# Patient Record
Sex: Female | Born: 1935
Health system: Southern US, Community
[De-identification: ages and names within clinical notes are randomized; demographics above are authoritative.]

## PROBLEM LIST (undated history)

## (undated) DIAGNOSIS — D500 Iron deficiency anemia secondary to blood loss (chronic): Secondary | ICD-10-CM

## (undated) DIAGNOSIS — K3189 Other diseases of stomach and duodenum: Secondary | ICD-10-CM

## (undated) DIAGNOSIS — E119 Type 2 diabetes mellitus without complications: Secondary | ICD-10-CM

## (undated) DIAGNOSIS — K5520 Angiodysplasia of colon without hemorrhage: Secondary | ICD-10-CM

## (undated) DIAGNOSIS — D649 Anemia, unspecified: Secondary | ICD-10-CM

## (undated) DIAGNOSIS — K219 Gastro-esophageal reflux disease without esophagitis: Secondary | ICD-10-CM

## (undated) DIAGNOSIS — I10 Essential (primary) hypertension: Secondary | ICD-10-CM

## (undated) DIAGNOSIS — C4491 Basal cell carcinoma of skin, unspecified: Secondary | ICD-10-CM

## (undated) DIAGNOSIS — E079 Disorder of thyroid, unspecified: Secondary | ICD-10-CM

## (undated) DIAGNOSIS — F419 Anxiety disorder, unspecified: Secondary | ICD-10-CM

## (undated) DIAGNOSIS — R7303 Prediabetes: Secondary | ICD-10-CM

## (undated) DIAGNOSIS — M1990 Unspecified osteoarthritis, unspecified site: Secondary | ICD-10-CM

## (undated) DIAGNOSIS — K5790 Diverticulosis of intestine, part unspecified, without perforation or abscess without bleeding: Secondary | ICD-10-CM

## (undated) DIAGNOSIS — K31819 Angiodysplasia of stomach and duodenum without bleeding: Secondary | ICD-10-CM

## (undated) DIAGNOSIS — K7460 Unspecified cirrhosis of liver: Secondary | ICD-10-CM

## (undated) DIAGNOSIS — K766 Portal hypertension: Secondary | ICD-10-CM

## (undated) HISTORY — DX: Disorder of thyroid, unspecified: E07.9

## (undated) HISTORY — PX: COLONOSCOPY: SHX174

## (undated) HISTORY — PX: UPPER GASTROINTESTINAL ENDOSCOPY: SHX188

## (undated) HISTORY — DX: Basal cell carcinoma of skin, unspecified: C44.91

## (undated) HISTORY — DX: Gastro-esophageal reflux disease without esophagitis: K21.9

## (undated) HISTORY — DX: Angiodysplasia of colon without hemorrhage: K55.20

## (undated) HISTORY — PX: MOHS SURGERY: SUR867

## (undated) HISTORY — PX: ESOPHAGOGASTRODUODENOSCOPY: SHX1529

## (undated) HISTORY — PX: TOTAL HIP ARTHROPLASTY: SHX124

## (undated) HISTORY — DX: Anemia, unspecified: D64.9

## (undated) HISTORY — DX: Other diseases of stomach and duodenum: K31.89

## (undated) HISTORY — PX: ABDOMINAL HYSTERECTOMY: SHX81

## (undated) HISTORY — DX: Type 2 diabetes mellitus without complications: E11.9

## (undated) HISTORY — DX: Portal hypertension: K76.6

## (undated) HISTORY — DX: Angiodysplasia of stomach and duodenum without bleeding: K31.819

## (undated) HISTORY — DX: Iron deficiency anemia secondary to blood loss (chronic): D50.0

## (undated) HISTORY — DX: Unspecified cirrhosis of liver: K74.60

## (undated) HISTORY — DX: Anxiety disorder, unspecified: F41.9

## (undated) HISTORY — DX: Unspecified osteoarthritis, unspecified site: M19.90

## (undated) HISTORY — PX: TONSILLECTOMY: SUR1361

## (undated) HISTORY — DX: Essential (primary) hypertension: I10

## (undated) HISTORY — PX: APPENDECTOMY: SHX54

## (undated) HISTORY — DX: Diverticulosis of intestine, part unspecified, without perforation or abscess without bleeding: K57.90

---

## 2007-05-15 DIAGNOSIS — R7301 Impaired fasting glucose: Secondary | ICD-10-CM | POA: Insufficient documentation

## 2007-05-15 DIAGNOSIS — L408 Other psoriasis: Secondary | ICD-10-CM | POA: Insufficient documentation

## 2007-05-15 DIAGNOSIS — K5730 Diverticulosis of large intestine without perforation or abscess without bleeding: Secondary | ICD-10-CM | POA: Insufficient documentation

## 2007-05-15 DIAGNOSIS — N951 Menopausal and female climacteric states: Secondary | ICD-10-CM | POA: Insufficient documentation

## 2008-11-22 DIAGNOSIS — E039 Hypothyroidism, unspecified: Secondary | ICD-10-CM | POA: Insufficient documentation

## 2009-05-24 DIAGNOSIS — D649 Anemia, unspecified: Secondary | ICD-10-CM | POA: Insufficient documentation

## 2009-05-24 DIAGNOSIS — D509 Iron deficiency anemia, unspecified: Secondary | ICD-10-CM | POA: Insufficient documentation

## 2009-10-28 DIAGNOSIS — K219 Gastro-esophageal reflux disease without esophagitis: Secondary | ICD-10-CM | POA: Insufficient documentation

## 2010-04-27 DIAGNOSIS — I10 Essential (primary) hypertension: Secondary | ICD-10-CM | POA: Insufficient documentation

## 2011-06-28 ENCOUNTER — Encounter: Payer: Self-pay | Admitting: Internal Medicine

## 2011-06-28 ENCOUNTER — Ambulatory Visit (INDEPENDENT_AMBULATORY_CARE_PROVIDER_SITE_OTHER): Payer: Medicare Other | Admitting: Internal Medicine

## 2011-06-28 VITALS — BP 148/72 | HR 81 | Temp 97.9°F | Resp 16 | Ht 67.0 in | Wt 187.0 lb

## 2011-06-28 DIAGNOSIS — E039 Hypothyroidism, unspecified: Secondary | ICD-10-CM

## 2011-06-28 DIAGNOSIS — F419 Anxiety disorder, unspecified: Secondary | ICD-10-CM | POA: Insufficient documentation

## 2011-06-28 DIAGNOSIS — Z780 Asymptomatic menopausal state: Secondary | ICD-10-CM | POA: Insufficient documentation

## 2011-06-28 DIAGNOSIS — Z90710 Acquired absence of both cervix and uterus: Secondary | ICD-10-CM | POA: Insufficient documentation

## 2011-06-28 DIAGNOSIS — M1990 Unspecified osteoarthritis, unspecified site: Secondary | ICD-10-CM | POA: Insufficient documentation

## 2011-06-28 DIAGNOSIS — Z862 Personal history of diseases of the blood and blood-forming organs and certain disorders involving the immune mechanism: Secondary | ICD-10-CM

## 2011-06-28 DIAGNOSIS — I10 Essential (primary) hypertension: Secondary | ICD-10-CM | POA: Insufficient documentation

## 2011-06-28 DIAGNOSIS — F411 Generalized anxiety disorder: Secondary | ICD-10-CM

## 2011-06-28 DIAGNOSIS — E785 Hyperlipidemia, unspecified: Secondary | ICD-10-CM | POA: Insufficient documentation

## 2011-06-28 DIAGNOSIS — H409 Unspecified glaucoma: Secondary | ICD-10-CM | POA: Insufficient documentation

## 2011-06-28 DIAGNOSIS — K219 Gastro-esophageal reflux disease without esophagitis: Secondary | ICD-10-CM | POA: Insufficient documentation

## 2011-06-28 MED ORDER — QUINAPRIL HCL 40 MG PO TABS: 40.0000 mg | ORAL_TABLET | Freq: Every day | ORAL | Status: DC

## 2011-06-28 MED ORDER — FUROSEMIDE 20 MG PO TABS: 20.0000 mg | ORAL_TABLET | Freq: Every day | ORAL | Status: DC

## 2011-06-28 MED ORDER — PANTOPRAZOLE SODIUM 40 MG PO TBEC: 40.0000 mg | DELAYED_RELEASE_TABLET | Freq: Every day | ORAL | Status: DC

## 2011-06-28 MED ORDER — LEVOTHYROXINE SODIUM 75 MCG PO TABS: 75.0000 ug | ORAL_TABLET | Freq: Every day | ORAL | Status: DC

## 2011-06-28 NOTE — Progress Notes (Signed)
Subjective:    Patient ID: Jessica Dennis, female    DOB: 05/25/1932, 78 y.o.   MRN: [id number]  HPI New pt here for first  Visit.  Former care Dr. Louisa Second in [place].    PMH of anemia, anxiety well controlled on Ativan,  DJD, HTN, GERD,  Hyperlipidemia, menopause, glaucoma and hypothyroidism.     Overall doing well.  She reports she has been on Hormones "forever"  Over 10 years. She is S/P Hysterectomy  She has a history of anemia nd is on Iron but does not want her blood checked until her CPE  No Known Allergies Past Medical History  Diagnosis Date  . Anemia   . Anxiety   . Arthritis   . Hypertension   . Thyroid disease    Past Surgical History  Procedure Date  . Abdominal hysterectomy   . Joint replacement 97 and 06    Lt and RT hip  . Appendectomy   . Tonsillectomy    History   Social History  . Marital Status: Divorced    Spouse Name: N/A    Number of Children: N/A  . Years of Education: N/A   Occupational History  . Not on file.   Social History Main Topics  . Smoking status: Never Smoker   . Smokeless tobacco: Never Used  . Alcohol Use: Yes  . Drug Use: No  . Sexually Active:    Other Topics Concern  . Not on file   Social History Narrative  . No narrative on file   Family History  Problem Relation Age of Onset  . Cancer Father     bladder  . Heart attack Father    Patient Active Problem List  Diagnoses  . History of anemia  . Anxiety  . Essential hypertension, benign  . GERD (gastroesophageal reflux disease)  . Other and unspecified hyperlipidemia  . History of hysterectomy  . Menopause  . Hypothyroidism  . Glaucoma  . DJD (degenerative joint disease)   Current Outpatient Prescriptions on File Prior to Visit  Medication Sig Dispense Refill  . calcium carbonate (OS-CAL) 1250 MG chewable tablet Chew 1 tablet by mouth daily.      Marland Kitchen estradiol (ESTRACE) 0.5 MG tablet Take 0.5 mg by mouth daily.      . furosemide (LASIX) 20 MG  tablet Take 20 mg by mouth daily.      Marland Kitchen levothyroxine (SYNTHROID, LEVOTHROID) 75 MCG tablet Take 75 mcg by mouth daily.      . pantoprazole (PROTONIX) 40 MG tablet Take 40 mg by mouth daily.      . quinapril (ACCUPRIL) 40 MG tablet Take 40 mg by mouth at bedtime.           Review of Systems See HPI    Objective:   Physical Exam  Physical Exam  Nursing note and vitals reviewed.  Constitutional: She is oriented to person, place, and time. She appears well-developed and well-nourished.  HENT:  Head: Normocephalic and atraumatic.  Cardiovascular: Normal rate and regular rhythm. Exam reveals no gallop and no friction rub.  No murmur heard.  Pulmonary/Chest: Breath sounds normal. She has no wheezes. She has no rales.  Neurological: She is alert and oriented to person, place, and time.  Skin: Skin is warm and dry.  Psychiatric: She has a normal mood and affect. Her behavior is normal.         Assessment & Plan:  Menopause  I counseled pt that given her age and  lengthof time on Estrogen that risks outweigh benefit.  She is OK with coming off Estradiol  HTN  ISH today if still elevated at CPE visit will need to adjust meds  History of Hyperlipidemia  History of anemia  Anxiety  GERD  Hypothyroidism    Willl need to re-order meds  Schedule CPE

## 2011-06-28 NOTE — Patient Instructions (Signed)
Schedule CPE    Come in fasting

## 2011-08-01 ENCOUNTER — Other Ambulatory Visit: Payer: Self-pay | Admitting: *Deleted

## 2011-08-01 MED ORDER — LORAZEPAM 0.5 MG PO TABS: 0.5000 mg | ORAL_TABLET | Freq: Two times a day (BID) | ORAL | Status: DC | PRN

## 2011-08-01 NOTE — Telephone Encounter (Signed)
Jessica Dennis ok to call in

## 2011-08-06 DIAGNOSIS — M179 Osteoarthritis of knee, unspecified: Secondary | ICD-10-CM | POA: Insufficient documentation

## 2011-08-06 DIAGNOSIS — M25569 Pain in unspecified knee: Secondary | ICD-10-CM | POA: Insufficient documentation

## 2011-08-06 DIAGNOSIS — M722 Plantar fascial fibromatosis: Secondary | ICD-10-CM | POA: Insufficient documentation

## 2011-08-06 DIAGNOSIS — M1710 Unilateral primary osteoarthritis, unspecified knee: Secondary | ICD-10-CM | POA: Insufficient documentation

## 2011-08-06 DIAGNOSIS — M79673 Pain in unspecified foot: Secondary | ICD-10-CM | POA: Insufficient documentation

## 2011-09-02 ENCOUNTER — Telehealth: Payer: Self-pay | Admitting: Internal Medicine

## 2011-09-02 NOTE — Telephone Encounter (Signed)
Jessica Dennis   Call pt and inform she needs fasting labs.  Per her old chart in 04/2009 she had elevated glucose, and was quite anemic and her thyroid blood work was not normal.  I do not see any repeat labs in her old record after that time.  She was seen 03/29/2010 but I do not see labs in her old record.    Please get fasting Lipids, CBC, chem 24,   And TSH,  And advise despite any labs from March, they need to be repeated.  Can use anemia, 401.1 and 272.4 for codes  Thanks

## 2011-09-03 ENCOUNTER — Telehealth: Payer: Self-pay | Admitting: *Deleted

## 2011-09-03 DIAGNOSIS — E785 Hyperlipidemia, unspecified: Secondary | ICD-10-CM

## 2011-09-03 DIAGNOSIS — I10 Essential (primary) hypertension: Secondary | ICD-10-CM

## 2011-09-03 NOTE — Telephone Encounter (Signed)
LM on cell voicemail that Dr Constance Goltz wants her to come in for fasting labwork.  Will go ahead and order the tests so that pt can choose whatever time and day she wants to come in.

## 2011-09-18 ENCOUNTER — Other Ambulatory Visit: Payer: Self-pay | Admitting: Internal Medicine

## 2011-09-18 LAB — CBC WITH DIFFERENTIAL/PLATELET
Basophils Absolute: 0 10*3/uL (ref 0.0–0.1)
Basophils Relative: 1 % (ref 0–1)
Eosinophils Absolute: 0.2 10*3/uL (ref 0.0–0.7)
Eosinophils Relative: 4 % (ref 0–5)
HCT: 37.5 % (ref 36.0–46.0)
Hemoglobin: 12.6 g/dL (ref 12.0–15.0)
Lymphocytes Relative: 20 % (ref 12–46)
Lymphs Abs: 1.1 10*3/uL (ref 0.7–4.0)
MCH: 32.5 pg (ref 26.0–34.0)
MCHC: 33.6 g/dL (ref 30.0–36.0)
MCV: 96.6 fL (ref 78.0–100.0)
Monocytes Absolute: 0.4 10*3/uL (ref 0.1–1.0)
Monocytes Relative: 6 % (ref 3–12)
Neutro Abs: 3.9 10*3/uL (ref 1.7–7.7)
Neutrophils Relative %: 69 % (ref 43–77)
Platelets: 89 10*3/uL — ABNORMAL LOW (ref 150–400)
RBC: 3.88 MIL/uL (ref 3.87–5.11)
RDW: 14.3 % (ref 11.5–15.5)
WBC: 5.6 10*3/uL (ref 4.0–10.5)

## 2011-09-18 LAB — LIPID PANEL
Cholesterol: 218 mg/dL — ABNORMAL HIGH (ref 0–200)
HDL: 54 mg/dL (ref >39)
LDL Cholesterol: 144 mg/dL — ABNORMAL HIGH (ref 0–99)
Total CHOL/HDL Ratio: 4 Ratio
Triglycerides: 99 mg/dL (ref <150)
VLDL: 20 mg/dL (ref 0–40)

## 2011-09-18 LAB — TSH: TSH: 3.838 u[IU]/mL (ref 0.350–4.500)

## 2011-09-19 ENCOUNTER — Encounter: Payer: Self-pay | Admitting: Internal Medicine

## 2011-09-19 ENCOUNTER — Telehealth: Payer: Self-pay | Admitting: Internal Medicine

## 2011-09-19 ENCOUNTER — Telehealth: Payer: Self-pay | Admitting: *Deleted

## 2011-09-19 DIAGNOSIS — R739 Hyperglycemia, unspecified: Secondary | ICD-10-CM | POA: Insufficient documentation

## 2011-09-19 LAB — HEMOGLOBIN A1C
Hgb A1c MFr Bld: 6 % — ABNORMAL HIGH (ref <5.7)
Mean Plasma Glucose: 126 mg/dL — ABNORMAL HIGH (ref <117)

## 2011-09-19 LAB — COMPLETE METABOLIC PANEL WITH GFR
ALT: 28 U/L (ref 0–35)
AST: 38 U/L — ABNORMAL HIGH (ref 0–37)
Albumin: 4.3 g/dL (ref 3.5–5.2)
Alkaline Phosphatase: 111 U/L (ref 39–117)
BUN: 16 mg/dL (ref 6–23)
CO2: 29 mEq/L (ref 19–32)
Calcium: 9.9 mg/dL (ref 8.4–10.5)
Chloride: 104 mEq/L (ref 96–112)
Creat: 0.75 mg/dL (ref 0.50–1.10)
GFR, Est African American: >89 mL/min
GFR, Est Non African American: 78 mL/min
Glucose, Bld: 168 mg/dL — ABNORMAL HIGH (ref 70–99)
Potassium: 4.2 mEq/L (ref 3.5–5.3)
Sodium: 141 mEq/L (ref 135–145)
Total Bilirubin: 1.1 mg/dL (ref 0.3–1.2)
Total Protein: 6.5 g/dL (ref 6.0–8.3)

## 2011-09-19 NOTE — Telephone Encounter (Signed)
Copy of labs mailed to pt's home address. 

## 2011-09-19 NOTE — Telephone Encounter (Signed)
Spoke with pt regarding glucose, confirmed appt for September. HGBA1C ordered

## 2011-09-19 NOTE — Telephone Encounter (Signed)
Jessica Dennis  Add a Hgb AIC to labs done on 8/27.  Call pt and let her know that her blood sugar is high and to be sure to keep her appt. with me in September.   OK to mail labs to her.

## 2011-10-02 ENCOUNTER — Ambulatory Visit (HOSPITAL_BASED_OUTPATIENT_CLINIC_OR_DEPARTMENT_OTHER)
Admission: RE | Admit: 2011-10-02 | Discharge: 2011-10-02 | Disposition: A | Discharge status: Home / Self Care | Payer: Medicare Other | Source: Ambulatory Visit | Attending: Internal Medicine | Admitting: Internal Medicine

## 2011-10-02 ENCOUNTER — Encounter: Payer: Self-pay | Admitting: Internal Medicine

## 2011-10-02 ENCOUNTER — Other Ambulatory Visit: Payer: Self-pay | Admitting: Internal Medicine

## 2011-10-02 ENCOUNTER — Other Ambulatory Visit: Payer: Self-pay | Admitting: *Deleted

## 2011-10-02 ENCOUNTER — Ambulatory Visit (INDEPENDENT_AMBULATORY_CARE_PROVIDER_SITE_OTHER): Payer: Medicare Other | Admitting: Internal Medicine

## 2011-10-02 VITALS — BP 144/84 | HR 86 | Temp 97.2°F | Resp 20 | Wt 182.0 lb

## 2011-10-02 DIAGNOSIS — Z0000 Encounter for general adult medical examination without abnormal findings: Secondary | ICD-10-CM

## 2011-10-02 DIAGNOSIS — Z1231 Encounter for screening mammogram for malignant neoplasm of breast: Secondary | ICD-10-CM | POA: Insufficient documentation

## 2011-10-02 DIAGNOSIS — R059 Cough, unspecified: Secondary | ICD-10-CM

## 2011-10-02 DIAGNOSIS — K219 Gastro-esophageal reflux disease without esophagitis: Secondary | ICD-10-CM

## 2011-10-02 DIAGNOSIS — R739 Hyperglycemia, unspecified: Secondary | ICD-10-CM

## 2011-10-02 DIAGNOSIS — Z862 Personal history of diseases of the blood and blood-forming organs and certain disorders involving the immune mechanism: Secondary | ICD-10-CM

## 2011-10-02 DIAGNOSIS — E785 Hyperlipidemia, unspecified: Secondary | ICD-10-CM

## 2011-10-02 DIAGNOSIS — R05 Cough: Secondary | ICD-10-CM

## 2011-10-02 DIAGNOSIS — Z23 Encounter for immunization: Secondary | ICD-10-CM

## 2011-10-02 DIAGNOSIS — I10 Essential (primary) hypertension: Secondary | ICD-10-CM

## 2011-10-02 DIAGNOSIS — E039 Hypothyroidism, unspecified: Secondary | ICD-10-CM

## 2011-10-02 DIAGNOSIS — R7309 Other abnormal glucose: Secondary | ICD-10-CM

## 2011-10-02 DIAGNOSIS — M1990 Unspecified osteoarthritis, unspecified site: Secondary | ICD-10-CM

## 2011-10-02 LAB — POCT URINALYSIS DIPSTICK
Bilirubin, UA: NEGATIVE
Blood, UA: NEGATIVE
Glucose, UA: NEGATIVE
Ketones, UA: NEGATIVE
Leukocytes, UA: NEGATIVE
Nitrite, UA: NEGATIVE
Protein, UA: NEGATIVE
Spec Grav, UA: 1.015
Urobilinogen, UA: NEGATIVE
pH, UA: 6

## 2011-10-02 MED ORDER — QUINAPRIL HCL 40 MG PO TABS: 40.0000 mg | ORAL_TABLET | Freq: Every day | ORAL | Status: DC

## 2011-10-02 MED ORDER — FUROSEMIDE 20 MG PO TABS: 20.0000 mg | ORAL_TABLET | Freq: Every day | ORAL | Status: DC

## 2011-10-02 MED ORDER — PANTOPRAZOLE SODIUM 40 MG PO TBEC: 40.0000 mg | DELAYED_RELEASE_TABLET | Freq: Every day | ORAL | Status: DC

## 2011-10-02 MED ORDER — LEVOTHYROXINE SODIUM 75 MCG PO TABS: 75.0000 ug | ORAL_TABLET | Freq: Every day | ORAL | Status: DC

## 2011-10-02 NOTE — Patient Instructions (Signed)
See me in 2-3 months  Take lasix daily

## 2011-10-02 NOTE — Telephone Encounter (Signed)
Jessica Dennis   Call this in for pt.  Let her know that I cannot prescribe 90 fdays of Ativan

## 2011-10-02 NOTE — Telephone Encounter (Signed)
Pharmacy called regarding Ativan RX

## 2011-10-02 NOTE — Progress Notes (Signed)
Subjective:    Patient ID: Jessica Dennis, female    DOB: 23-Jun-1932, 78 y.o.   MRN: [id number]  Jessica Dennis is here for comprehensive eval.    She reports she has not been taking her Lasix as she has not had any edema.  See BP  She reports she has had elevated sugar in the past but no-one has ever mentioned DM.  She does have FH of DM. No polyphagia , no polydipsia  She does water aerobics for exercise  Lots of sun exposure and she sees Jessica Dennis for dermatology  She is S/P hysterectomy  And is overdue for her mammogram  She has cough on and off and not sure if related to ACE inhibitor.  Occurs at night.  Has been going on for 4 weeks now.  No SOB,   Has been on Accupril "forever"  No Known Allergies Past Medical History  Diagnosis Date  . Anemia   . Anxiety   . Arthritis   . Hypertension   . Thyroid disease    Past Surgical History  Procedure Date  . Abdominal hysterectomy   . Joint replacement 97 and 06    Lt and RT hip  . Appendectomy   . Tonsillectomy    History   Social History  . Marital Status: Divorced    Spouse Name: N/A    Number of Children: N/A  . Years of Education: N/A   Occupational History  . Not on file.   Social History Main Topics  . Smoking status: Never Smoker   . Smokeless tobacco: Never Used  . Alcohol Use: Yes  . Drug Use: No  . Sexually Active: No   Other Topics Concern  . Not on file   Social History Narrative  . No narrative on file   Family History  Problem Relation Age of Onset  . Cancer Father     bladder  . Heart attack Father    Patient Active Problem List  Diagnosis  . History of anemia  . Anxiety  . Essential hypertension, benign  . GERD (gastroesophageal reflux disease)  . Other and unspecified hyperlipidemia  . History of hysterectomy  . Menopause  . Hypothyroidism  . Glaucoma  . DJD (degenerative joint disease)  . Hyperglycemia   Current Outpatient Prescriptions on File Prior to Visit  Medication Sig  Dispense Refill  . calcium carbonate (OS-CAL) 1250 MG chewable tablet Chew 1 tablet by mouth daily.      . naproxen (NAPROSYN) 500 MG tablet Take 500 mg by mouth 2 (two) times daily with a meal.      . Prednicarbate 0.1 % CREA Apply topically.      . travoprost, benzalkonium, (TRAVATAN) 0.004 % ophthalmic solution 1 drop at bedtime.      Marland Kitchen DISCONTD: furosemide (LASIX) 20 MG tablet Take 1 tablet (20 mg total) by mouth daily.  90 tablet  0  . DISCONTD: levothyroxine (SYNTHROID, LEVOTHROID) 75 MCG tablet Take 1 tablet (75 mcg total) by mouth daily.  90 tablet  0  . DISCONTD: pantoprazole (PROTONIX) 40 MG tablet Take 1 tablet (40 mg total) by mouth daily.  90 tablet  0  . DISCONTD: quinapril (ACCUPRIL) 40 MG tablet Take 1 tablet (40 mg total) by mouth at bedtime.  90 tablet  0       Review of Systems  Constitutional: Negative.   HENT: Negative.   Respiratory: Positive for cough.   All other systems reviewed and are negative.  Objective:   Physical Exam Physical Exam  Nursing note and vitals reviewed.  Constitutional: She is oriented to person, place, and time. She appears well-developed and well-nourished.  HENT:  Head: Normocephalic and atraumatic.  Right Ear: Tympanic membrane and ear canal normal. No drainage. Tympanic membrane is not injected and not erythematous.  Left Ear: Tympanic membrane and ear canal normal. No drainage. Tympanic membrane is not injected and not erythematous.  Nose: Nose normal. Right sinus exhibits no maxillary sinus tenderness and no frontal sinus tenderness. Left sinus exhibits no maxillary sinus tenderness and no frontal sinus tenderness.  Mouth/Throat: Oropharynx is clear and moist. No oral lesions. No oropharyngeal exudate.  Eyes: Conjunctivae and EOM are normal. Pupils are equal, round, and reactive to light.  Neck: Normal range of motion. Neck supple. No JVD present. Carotid bruit is not present. No mass and no thyromegaly present.    Cardiovascular: Normal rate, regular rhythm, S1 normal, S2 normal and intact distal pulses. Exam reveals no gallop and no friction rub.  No murmur heard.  Pulses:  Carotid pulses are 2+ on the right side, and 2+ on the left side.  Dorsalis pedis pulses are 2+ on the right side, and 2+ on the left side.  No carotid bruit. No LE edema  Pulmonary/Chest: Breath sounds normal. She has no wheezes. She has no rales. She exhibits no tenderness.  Breast: no discrete masses no nipple discharge no axillary adenopathy bilaterally Abdominal: Soft. Bowel sounds are normal. She exhibits no distension and no mass. There is no hepatosplenomegaly. There is no tenderness. There is no CVA tenderness.  REctal no mass guaiac neg. Musculoskeletal: Normal range of motion.  No active synovitis to joints.  Lymphadenopathy:  She has no cervical adenopathy.  She has no axillary adenopathy.  Right: No inguinal and no supraclavicular adenopathy present.  Left: No inguinal and no supraclavicular adenopathy present.  Neurological: She is alert and oriented to person, place, and time. She has normal strength and normal reflexes. She displays no tremor. No cranial nerve deficit or sensory deficit. Coordination and gait normal.  Skin: Skin is warm and dry. No rash noted. No cyanosis. Nails show no clubbing.  Psychiatric: She has a normal mood and affect. Her speech is normal and behavior is normal. Cognition and memory are normal.           Assessment & Plan:  Health Maintenance:  See scanned HM sheet MM today  She declines bone density.  Tdap today  Flu vaccne pt wishes to get at [facility]  HTN:  Not at goal.  Advised to take Lasix daily and will recheck in 2-3 months along with K  Hyperglycemia  Check AIC today she is fasting today.  If elevated will neeed to initiate meds'  GERD  Continue meds  DJD  Hyperlipidemia  DAsh diet for now  History of anemia  Normal hgb now  See me in 2-3 months check BP K or  sooner if glucose elevatedd

## 2011-10-03 ENCOUNTER — Telehealth: Payer: Self-pay | Admitting: *Deleted

## 2011-10-03 MED ORDER — LORAZEPAM 0.5 MG PO TABS: ORAL_TABLET | ORAL | Status: DC

## 2011-10-04 ENCOUNTER — Other Ambulatory Visit: Payer: Self-pay | Admitting: Internal Medicine

## 2011-10-04 ENCOUNTER — Other Ambulatory Visit: Payer: Self-pay | Admitting: *Deleted

## 2011-10-04 DIAGNOSIS — R928 Other abnormal and inconclusive findings on diagnostic imaging of breast: Secondary | ICD-10-CM

## 2011-10-09 ENCOUNTER — Telehealth: Payer: Self-pay | Admitting: *Deleted

## 2011-10-09 NOTE — Telephone Encounter (Signed)
Called in rx as well as pt to let her know we were unable to prescribe 90 days

## 2011-10-09 NOTE — Telephone Encounter (Signed)
Message copied by Mathews Robinsons on Tue Oct 09, 2011 11:40 AM ------      Message from: Raechel Chute D      Created: Wed Oct 03, 2011 12:31 PM       Call pt and let her know that her CXR is normal  No worrisome findings

## 2011-10-09 NOTE — Telephone Encounter (Signed)
Called pt with chest xray results.  

## 2011-10-09 NOTE — Telephone Encounter (Signed)
Notified ot of -chest xray

## 2011-11-06 ENCOUNTER — Other Ambulatory Visit: Payer: Self-pay | Admitting: *Deleted

## 2011-11-07 MED ORDER — NAPROXEN 500 MG PO TABS: 500.0000 mg | ORAL_TABLET | Freq: Two times a day (BID) | ORAL | Status: DC

## 2011-11-21 ENCOUNTER — Other Ambulatory Visit: Payer: Self-pay | Admitting: Internal Medicine

## 2011-11-21 NOTE — Telephone Encounter (Signed)
Pt states she needs a refill longer than thirty days per prescription for LORazepam (Tab) ATIVAN 0.5 MG Bid as needed ... She is tired of having to call and get this refill when she takes about two a day sometimes... She goes to [facility] near [facility] [employer].Marland KitchenMarland Kitchen

## 2011-11-26 ENCOUNTER — Telehealth: Payer: Self-pay | Admitting: Internal Medicine

## 2011-11-26 MED ORDER — LORAZEPAM 0.5 MG PO TABS: ORAL_TABLET | ORAL | Status: DC

## 2011-11-26 NOTE — Telephone Encounter (Signed)
[facility]  Call Katrina and get labs done from 9/10  They are not in [facility]

## 2011-11-26 NOTE — Telephone Encounter (Signed)
Jessica Dennis   Let [name] know I gavie her 60 tablets with one refill   Will need to call to pharmacy  Have her come for her labs if  Not done

## 2011-11-29 ENCOUNTER — Telehealth: Payer: Self-pay | Admitting: *Deleted

## 2011-12-04 ENCOUNTER — Telehealth: Payer: Self-pay | Admitting: *Deleted

## 2011-12-04 NOTE — Telephone Encounter (Signed)
Pt will come in this week for a redraw of labs

## 2011-12-04 NOTE — Telephone Encounter (Signed)
Pt had blood drawn upstairs. According to [facility] the blood was not received pt will come in this week for a redraw

## 2011-12-05 NOTE — Telephone Encounter (Signed)
Thanks bobbie  Be sure to tell pt to come in fasting as her random glucose was high and I want to recheck it fasting

## 2011-12-06 LAB — COMPREHENSIVE METABOLIC PANEL
ALT: 31 U/L (ref 0–35)
AST: 37 U/L (ref 0–37)
Albumin: 4.2 g/dL (ref 3.5–5.2)
Alkaline Phosphatase: 143 U/L — ABNORMAL HIGH (ref 39–117)
BUN: 15 mg/dL (ref 6–23)
CO2: 24 mEq/L (ref 19–32)
Calcium: 9.3 mg/dL (ref 8.4–10.5)
Chloride: 103 mEq/L (ref 96–112)
Creat: 0.66 mg/dL (ref 0.50–1.10)
Glucose, Bld: 137 mg/dL — ABNORMAL HIGH (ref 70–99)
Potassium: 3.7 mEq/L (ref 3.5–5.3)
Sodium: 140 mEq/L (ref 135–145)
Total Bilirubin: 1.3 mg/dL — ABNORMAL HIGH (ref 0.3–1.2)
Total Protein: 6.3 g/dL (ref 6.0–8.3)

## 2011-12-06 LAB — HEMOGLOBIN A1C
Hgb A1c MFr Bld: 6.1 % — ABNORMAL HIGH (ref <5.7)
Mean Plasma Glucose: 128 mg/dL — ABNORMAL HIGH (ref <117)

## 2011-12-10 ENCOUNTER — Telehealth: Payer: Self-pay | Admitting: Internal Medicine

## 2011-12-10 DIAGNOSIS — R7301 Impaired fasting glucose: Secondary | ICD-10-CM

## 2011-12-10 NOTE — Telephone Encounter (Signed)
Spoke with pt and informed of abnormal glucose and liver function tests.    Will refer for diabetes nutrition education and pt counseled to see me when she returns from her trip To [place] in December  She voices understadning

## 2011-12-11 ENCOUNTER — Encounter: Payer: Self-pay | Admitting: *Deleted

## 2011-12-11 ENCOUNTER — Telehealth: Payer: Self-pay | Admitting: *Deleted

## 2011-12-11 NOTE — Telephone Encounter (Signed)
Message copied by Mathews Robinsons on Tue Dec 11, 2011  2:01 PM ------      Message from: Raechel Chute D      Created: Mon Dec 10, 2011  8:31 AM       Ok to mail to pt

## 2011-12-11 NOTE — Telephone Encounter (Signed)
Mailed lab results to pt home address

## 2011-12-11 NOTE — Telephone Encounter (Signed)
Results mailed to pt home address.

## 2011-12-11 NOTE — Telephone Encounter (Signed)
Message copied by Mathews Robinsons on Tue Dec 11, 2011  2:11 PM ------      Message from: Raechel Chute D      Created: Mon Dec 10, 2011  8:31 AM       Ok to mail to pt

## 2011-12-24 ENCOUNTER — Telehealth: Payer: Self-pay | Admitting: Internal Medicine

## 2011-12-24 NOTE — Telephone Encounter (Signed)
Jessica Dennis  Call lab and have them put pts recent labs in [facility]

## 2012-01-17 ENCOUNTER — Other Ambulatory Visit: Payer: Self-pay | Admitting: *Deleted

## 2012-01-17 NOTE — Telephone Encounter (Signed)
Will call in pending approval 

## 2012-01-18 ENCOUNTER — Other Ambulatory Visit: Payer: Self-pay | Admitting: *Deleted

## 2012-01-18 NOTE — Telephone Encounter (Signed)
Notified pt that rx has been called in to [facility]

## 2012-01-18 NOTE — Telephone Encounter (Signed)
Called xanax in to [facility]

## 2012-01-20 MED ORDER — LORAZEPAM 0.5 MG PO TABS: ORAL_TABLET | ORAL | Status: DC

## 2012-02-04 ENCOUNTER — Other Ambulatory Visit: Payer: Self-pay | Admitting: Internal Medicine

## 2012-02-04 NOTE — Telephone Encounter (Signed)
Refill request

## 2012-03-13 ENCOUNTER — Ambulatory Visit (INDEPENDENT_AMBULATORY_CARE_PROVIDER_SITE_OTHER): Payer: Medicare Other | Admitting: Internal Medicine

## 2012-03-13 ENCOUNTER — Encounter: Payer: Self-pay | Admitting: Internal Medicine

## 2012-03-13 ENCOUNTER — Encounter (HOSPITAL_BASED_OUTPATIENT_CLINIC_OR_DEPARTMENT_OTHER): Payer: Self-pay

## 2012-03-13 ENCOUNTER — Emergency Department (HOSPITAL_BASED_OUTPATIENT_CLINIC_OR_DEPARTMENT_OTHER)
Admission: EM | Admit: 2012-03-13 | Discharge: 2012-03-13 | Disposition: A | Discharge status: Home / Self Care | Payer: Medicare Other | Attending: Emergency Medicine | Admitting: Emergency Medicine

## 2012-03-13 VITALS — BP 144/75 | HR 90 | Resp 18 | Ht 67.0 in | Wt 183.0 lb

## 2012-03-13 DIAGNOSIS — M129 Arthropathy, unspecified: Secondary | ICD-10-CM | POA: Insufficient documentation

## 2012-03-13 DIAGNOSIS — E119 Type 2 diabetes mellitus without complications: Secondary | ICD-10-CM

## 2012-03-13 DIAGNOSIS — I10 Essential (primary) hypertension: Secondary | ICD-10-CM | POA: Insufficient documentation

## 2012-03-13 DIAGNOSIS — R221 Localized swelling, mass and lump, neck: Secondary | ICD-10-CM

## 2012-03-13 DIAGNOSIS — R220 Localized swelling, mass and lump, head: Secondary | ICD-10-CM

## 2012-03-13 DIAGNOSIS — R739 Hyperglycemia, unspecified: Secondary | ICD-10-CM

## 2012-03-13 DIAGNOSIS — E079 Disorder of thyroid, unspecified: Secondary | ICD-10-CM | POA: Insufficient documentation

## 2012-03-13 DIAGNOSIS — R7309 Other abnormal glucose: Secondary | ICD-10-CM

## 2012-03-13 DIAGNOSIS — Z79899 Other long term (current) drug therapy: Secondary | ICD-10-CM | POA: Insufficient documentation

## 2012-03-13 DIAGNOSIS — Z862 Personal history of diseases of the blood and blood-forming organs and certain disorders involving the immune mechanism: Secondary | ICD-10-CM | POA: Insufficient documentation

## 2012-03-13 DIAGNOSIS — N390 Urinary tract infection, site not specified: Secondary | ICD-10-CM

## 2012-03-13 DIAGNOSIS — F411 Generalized anxiety disorder: Secondary | ICD-10-CM | POA: Insufficient documentation

## 2012-03-13 HISTORY — DX: Prediabetes: R73.03

## 2012-03-13 LAB — CBC WITH DIFFERENTIAL/PLATELET
Basophils Absolute: 0 10*3/uL (ref 0.0–0.1)
Basophils Relative: 0 % (ref 0–1)
Eosinophils Absolute: 0.2 10*3/uL (ref 0.0–0.7)
Eosinophils Relative: 3 % (ref 0–5)
HCT: 39.5 % (ref 36.0–46.0)
Hemoglobin: 14.1 g/dL (ref 12.0–15.0)
Lymphocytes Relative: 20 % (ref 12–46)
Lymphs Abs: 1.3 10*3/uL (ref 0.7–4.0)
MCH: 33.5 pg (ref 26.0–34.0)
MCHC: 35.7 g/dL (ref 30.0–36.0)
MCV: 93.8 fL (ref 78.0–100.0)
Monocytes Absolute: 0.5 10*3/uL (ref 0.1–1.0)
Monocytes Relative: 8 % (ref 3–12)
Neutro Abs: 4.3 10*3/uL (ref 1.7–7.7)
Neutrophils Relative %: 69 % (ref 43–77)
Platelets: 61 10*3/uL — ABNORMAL LOW (ref 150–400)
RBC: 4.21 MIL/uL (ref 3.87–5.11)
RDW: 13.6 % (ref 11.5–15.5)
WBC: 6.3 10*3/uL (ref 4.0–10.5)

## 2012-03-13 LAB — COMPREHENSIVE METABOLIC PANEL
ALT: 39 U/L — ABNORMAL HIGH (ref 0–35)
AST: 56 U/L — ABNORMAL HIGH (ref 0–37)
Albumin: 3.7 g/dL (ref 3.5–5.2)
Alkaline Phosphatase: 164 U/L — ABNORMAL HIGH (ref 39–117)
BUN: 11 mg/dL (ref 6–23)
CO2: 26 mEq/L (ref 19–32)
Calcium: 9.7 mg/dL (ref 8.4–10.5)
Chloride: 98 mEq/L (ref 96–112)
Creatinine, Ser: 0.6 mg/dL (ref 0.50–1.10)
GFR calc Af Amer: >90 mL/min (ref >90)
GFR calc non Af Amer: 86 mL/min — ABNORMAL LOW (ref >90)
Glucose, Bld: 411 mg/dL — ABNORMAL HIGH (ref 70–99)
Potassium: 4.1 mEq/L (ref 3.5–5.1)
Sodium: 134 mEq/L — ABNORMAL LOW (ref 135–145)
Total Bilirubin: 1.2 mg/dL (ref 0.3–1.2)
Total Protein: 7 g/dL (ref 6.0–8.3)

## 2012-03-13 LAB — URINE MICROSCOPIC-ADD ON

## 2012-03-13 LAB — GLUCOSE, CAPILLARY
Glucose-Capillary: 400 mg/dL — ABNORMAL HIGH (ref 70–99)
Glucose-Capillary: 296 mg/dL — ABNORMAL HIGH (ref 70–99)

## 2012-03-13 LAB — URINALYSIS, ROUTINE W REFLEX MICROSCOPIC
Bilirubin Urine: NEGATIVE
Glucose, UA: >1000 mg/dL — AB
Hgb urine dipstick: NEGATIVE
Ketones, ur: NEGATIVE mg/dL
Leukocytes, UA: NEGATIVE
Nitrite: POSITIVE — AB
Protein, ur: NEGATIVE mg/dL
Specific Gravity, Urine: 1.017 (ref 1.005–1.030)
Urobilinogen, UA: 0.2 mg/dL (ref 0.0–1.0)
pH: 6 (ref 5.0–8.0)

## 2012-03-13 MED ORDER — CEPHALEXIN 250 MG PO CAPS: 250.0000 mg | ORAL_CAPSULE | Freq: Four times a day (QID) | ORAL | Status: DC

## 2012-03-13 MED ORDER — SODIUM CHLORIDE 0.9 % IV BOLUS (SEPSIS)
1000.0000 mL | Freq: Once | INTRAVENOUS | Status: AC
  Administered 2012-03-13: 1000 mL via INTRAVENOUS

## 2012-03-13 MED ORDER — FREESTYLE SYSTEM KIT: 1.0000 | PACK | Status: DC | PRN

## 2012-03-13 MED ORDER — SODIUM CHLORIDE 0.9 % IV BOLUS (SEPSIS): 1000.0000 mL | Freq: Once | INTRAVENOUS | Status: DC

## 2012-03-13 MED ORDER — METFORMIN HCL ER 500 MG PO TB24: 500.0000 mg | ORAL_TABLET | Freq: Every day | ORAL | Status: DC

## 2012-03-13 NOTE — ED Notes (Signed)
Pt was seen by PMD this am for a nodule on her neck and found to have hyperglycemia.  Sent to ED for evaluation.

## 2012-03-13 NOTE — ED Notes (Signed)
MD at bedside. 

## 2012-03-13 NOTE — Patient Instructions (Addendum)
See me in office MOnday

## 2012-03-13 NOTE — ED Provider Notes (Signed)
History     CSN: [id number]  Arrival date & time 03/13/12  1232   First MD Initiated Contact with Patient 03/13/12 1233      Chief Complaint  Patient presents with  . Hyperglycemia    (Consider location/radiation/quality/duration/timing/severity/associated sxs/prior treatment) HPI Comments: 79 yo female sent from PCP for hyperglycemia. 450 at their office. Asymptomatic. Just prior to arrival ate large bowl of cereal with half and half creamer. Seen today for small lymph node on anterior neck, FSBS incidental finding. Patient denies HA, blurry vision, fever, chills, chest pain, chest pressure, shortness of breath, abdominal pain, nausea, vomiting, diarrhea, rashes. She has hx of borderline diabetes, no FSBS checks at home, controls blood sugar with diet alone, last PCP check Nov 2013 FSBS 190's. Patient states she feels great and wishes to go home to fix her pipes at home. Has hx of hypothyroidism, hypertension, partial hysterectomy, uses wine daily, never smoker. Lymph node on neck, painless, small, present and unchanged for 2 months. No fevers, weight loss etc.  The history is provided by the patient. No language interpreter was used.    Past Medical History  Diagnosis Date  . Anemia   . Anxiety   . Arthritis   . Hypertension   . Thyroid disease   . Borderline diabetes     Past Surgical History  Procedure Laterality Date  . Abdominal hysterectomy    . Joint replacement  97 and 06    Lt and RT hip  . Appendectomy    . Tonsillectomy      Family History  Problem Relation Age of Onset  . Cancer Father     bladder  . Heart attack Father     History  Substance Use Topics  . Smoking status: Never Smoker   . Smokeless tobacco: Never Used  . Alcohol Use: Yes     Comment: glass of wine daily    OB History   Grav Para Term Preterm Abortions TAB SAB Ect Mult Living   2 2        2       Review of Systems  Constitutional: Negative for fever and chills.  HENT: Negative  for sore throat and neck pain.   Eyes: Negative for visual disturbance.  Respiratory: Negative for cough and shortness of breath.   Cardiovascular: Negative for chest pain.  Gastrointestinal: Negative for nausea, vomiting, abdominal pain and diarrhea.  Genitourinary: Negative for dysuria and frequency.  Musculoskeletal: Negative for back pain.  Skin: Negative for rash.  Neurological: Negative for weakness, numbness and headaches.  Hematological: Negative for adenopathy.  Psychiatric/Behavioral: Negative for behavioral problems.    Allergies  Review of patient's allergies indicates no known allergies.  Home Medications   Current Outpatient Rx  Name  Route  Sig  Dispense  Refill  . calcium carbonate (OS-CAL) 1250 MG chewable tablet   Oral   Chew 1 tablet by mouth daily.         . cephALEXin (KEFLEX) 250 MG capsule   Oral   Take 1 capsule (250 mg total) by mouth 4 (four) times daily.   28 capsule   0   . furosemide (LASIX) 20 MG tablet   Oral   Take 1 tablet (20 mg total) by mouth daily.   90 tablet   0   . glucose monitoring kit (FREESTYLE) monitoring kit   Does not apply   1 each by Does not apply route as needed for other.   1 each  0   . levothyroxine (SYNTHROID, LEVOTHROID) 75 MCG tablet   Oral   Take 1 tablet (75 mcg total) by mouth daily.   90 tablet   1   . LORazepam (ATIVAN) 0.5 MG tablet      Bid as needed   60 tablet   3     No refills available   . metFORMIN (GLUCOPHAGE XR) 500 MG 24 hr tablet   Oral   Take 1 tablet (500 mg total) by mouth daily with breakfast.   30 tablet   0   . naproxen (NAPROSYN) 500 MG tablet   Oral   Take 1 tablet (500 mg total) by mouth 2 (two) times daily with a meal.   60 tablet   0   . pantoprazole (PROTONIX) 40 MG tablet   Oral   Take 1 tablet (40 mg total) by mouth daily.   90 tablet   1   . Prednicarbate 0.1 % CREA   Apply externally   Apply topically.         . quinapril (ACCUPRIL) 40 MG  tablet      TAKE 1 TABLET (40 MG TOTAL) BY MOUTH AT BEDTIME.   90 tablet   0     No refills available   . travoprost, benzalkonium, (TRAVATAN) 0.004 % ophthalmic solution      1 drop at bedtime.           BP 170/80  Pulse 72  Temp(Src) 97.6 F (36.4 C) (Oral)  Resp 16  Ht 5\' 7"  (1.702 m)  Wt 183 lb 8 oz (83.235 kg)  BMI 28.73 kg/m2  SpO2 94%  Physical Exam  Nursing note and vitals reviewed. Constitutional: She appears well-developed and well-nourished. No distress.  Well appearing female, laughing and smiling, vital signs are stable other than BP 170/80. Afebrile.  HENT:  Head: Normocephalic and atraumatic.  Mouth/Throat: Oropharynx is clear and moist. No oropharyngeal exudate.  Eyes: Conjunctivae and EOM are normal. Pupils are equal, round, and reactive to light. Right eye exhibits no discharge. Left eye exhibits no discharge. No scleral icterus.  Neck: Normal range of motion. Neck supple. No JVD present. No thyromegaly present.  Cardiovascular: Normal rate, regular rhythm, normal heart sounds and intact distal pulses.  Exam reveals no gallop and no friction rub.   No murmur heard. Pulmonary/Chest: Effort normal and breath sounds normal. No respiratory distress. She has no wheezes. She has no rales.  Abdominal: Soft. Bowel sounds are normal. She exhibits no distension and no mass. There is no tenderness.  Musculoskeletal: Normal range of motion. She exhibits no edema and no tenderness.  Lymphadenopathy:    She has no cervical adenopathy.  Neurological: She is alert. Coordination normal.  Skin: Skin is warm and dry. No rash noted. No erythema.  Psychiatric: She has a normal mood and affect. Her behavior is normal.    ED Course  Procedures (including critical care time)  Labs Reviewed  GLUCOSE, CAPILLARY - Abnormal; Notable for the following:    Glucose-Capillary 400 (*)    All other components within normal limits  CBC WITH DIFFERENTIAL - Abnormal; Notable for  the following:    Platelets 61 (*)    All other components within normal limits  COMPREHENSIVE METABOLIC PANEL - Abnormal; Notable for the following:    Sodium 134 (*)    Glucose, Bld 411 (*)    AST 56 (*)    ALT 39 (*)    Alkaline Phosphatase 164 (*)  GFR calc non Af Amer 86 (*)    All other components within normal limits  URINALYSIS, ROUTINE W REFLEX MICROSCOPIC - Abnormal; Notable for the following:    Glucose, UA >1000 (*)    Nitrite POSITIVE (*)    All other components within normal limits  GLUCOSE, CAPILLARY - Abnormal; Notable for the following:    Glucose-Capillary 296 (*)    All other components within normal limits  URINE MICROSCOPIC-ADD ON - Abnormal; Notable for the following:    Bacteria, UA MANY (*)    All other components within normal limits  URINE CULTURE   No results found.   1. Diabetes   2. UTI (lower urinary tract infection)       MDM  79 yo female with asymptomatic hyperglycemia. DDx: type II DM, hyperglycemia, DKA, ACS, UTI, dehydration. BMP, CBC, UA, fluids, reassess. Will need outpatient management of hyperglycemia.  Blood sugar improved to less than 300 while in the emergency department, discussed care with her primary Dr. who recommends extended-release metformin, followup in the office tomorrow to help her understand how to take the medication and how to use the glucometer.  Antibiotic prescribed for urinary tract infection, patient stable for discharge. She denies weight loss, has no significant abnormal vital signs, laboratory and is tolerating by mouth without difficulty.        Vida Roller, MD 03/14/12 [phone]

## 2012-03-13 NOTE — Progress Notes (Signed)
Subjective:    Patient ID: Jessica Dennis, female    DOB: 06-Dec-1932, 79 y.o.   MRN: [id number]  HPI  Jessica Dennis is here for acute visit.  She is concerned over a lump in her throat that has been present for 2-3 months on L side of neck  See capillary glucose.  She has also been feeling listless.  She denies polyuria , increased thirst or polyphagia. Capillary blood glucose 454 in office  No Known Allergies Past Medical History  Diagnosis Date  . Anemia   . Anxiety   . Arthritis   . Hypertension   . Thyroid disease    Past Surgical History  Procedure Laterality Date  . Abdominal hysterectomy    . Joint replacement  97 and 06    Lt and RT hip  . Appendectomy    . Tonsillectomy     History   Social History  . Marital Status: Divorced    Spouse Name: N/A    Number of Children: N/A  . Years of Education: N/A   Occupational History  . Not on file.   Social History Main Topics  . Smoking status: Never Smoker   . Smokeless tobacco: Never Used  . Alcohol Use: Yes  . Drug Use: No  . Sexually Active: No   Other Topics Concern  . Not on file   Social History Narrative  . No narrative on file   Family History  Problem Relation Age of Onset  . Cancer Father     bladder  . Heart attack Father    Patient Active Problem List  Diagnosis  . History of anemia  . Anxiety  . Essential hypertension, benign  . GERD (gastroesophageal reflux disease)  . Other and unspecified hyperlipidemia  . History of hysterectomy  . Menopause  . Hypothyroidism  . Glaucoma  . DJD (degenerative joint disease)  . Hyperglycemia  . Diabetes mellitus, new onset   Current Outpatient Prescriptions on File Prior to Visit  Medication Sig Dispense Refill  . calcium carbonate (OS-CAL) 1250 MG chewable tablet Chew 1 tablet by mouth daily.      . furosemide (LASIX) 20 MG tablet Take 1 tablet (20 mg total) by mouth daily.  90 tablet  0  . levothyroxine (SYNTHROID, LEVOTHROID) 75 MCG tablet Take 1  tablet (75 mcg total) by mouth daily.  90 tablet  1  . naproxen (NAPROSYN) 500 MG tablet Take 1 tablet (500 mg total) by mouth 2 (two) times daily with a meal.  60 tablet  0  . pantoprazole (PROTONIX) 40 MG tablet Take 1 tablet (40 mg total) by mouth daily.  90 tablet  1  . quinapril (ACCUPRIL) 40 MG tablet TAKE 1 TABLET (40 MG TOTAL) BY MOUTH AT BEDTIME.  90 tablet  0  . travoprost, benzalkonium, (TRAVATAN) 0.004 % ophthalmic solution 1 drop at bedtime.      Marland Kitchen LORazepam (ATIVAN) 0.5 MG tablet Bid as needed  60 tablet  3  . Prednicarbate 0.1 % CREA Apply topically.       No current facility-administered medications on file prior to visit.      Review of Systems See HPI    Objective:   Physical Exam Physical Exam  Nursing note and vitals reviewed.  Alert Constitutional: She is oriented to person, place, and time. She appears well-developed and well-nourished.  HENT:  Head: Normocephalic and atraumatic.  Neck  She does have a approx 2 cm mass upper neck vs floor of mouth.  R side of neck Cardiovascular: Normal rate and regular rhythm. Exam reveals no gallop and no friction rub.  No murmur heard.  Pulmonary/Chest: Breath sounds normal. She has no wheezes. She has no rales.  Neurological: She is alert and oriented to person, place, and time.  Skin: Skin is warm and dry.  Psychiatric: She has a normal mood and affect. Her behavior is normal.             Assessment & Plan:  New onset diabetes  / Hyperglycemia   Will send to ER for IVF"S and treatment.  See me on Monday  Upper neck mass:  Lymph node versus salivary gland mass.  Will need CT .  If not done in ER  Will check on MOncay when she sees me

## 2012-03-14 ENCOUNTER — Telehealth: Payer: Self-pay | Admitting: *Deleted

## 2012-03-14 NOTE — Telephone Encounter (Signed)
Pt will come in with diabetic supplies for education this AM

## 2012-03-15 LAB — URINE CULTURE: Colony Count: >=100000

## 2012-03-16 ENCOUNTER — Telehealth (HOSPITAL_COMMUNITY): Payer: Self-pay | Admitting: Emergency Medicine

## 2012-03-16 NOTE — ED Notes (Signed)
Patient has +Urine culture. Checking to see if appropriately treated. °

## 2012-03-16 NOTE — ED Notes (Signed)
+  Urine. Patient treated with Keflex. Sensitive to same. Per protocol MD. °

## 2012-03-18 ENCOUNTER — Encounter: Payer: Self-pay | Admitting: Internal Medicine

## 2012-03-18 ENCOUNTER — Encounter (HOSPITAL_BASED_OUTPATIENT_CLINIC_OR_DEPARTMENT_OTHER): Payer: Self-pay

## 2012-03-18 ENCOUNTER — Telehealth: Payer: Self-pay | Admitting: *Deleted

## 2012-03-18 ENCOUNTER — Ambulatory Visit (HOSPITAL_BASED_OUTPATIENT_CLINIC_OR_DEPARTMENT_OTHER)
Admission: RE | Admit: 2012-03-18 | Discharge: 2012-03-18 | Disposition: A | Discharge status: Home / Self Care | Payer: Medicare Other | Source: Ambulatory Visit | Attending: Internal Medicine | Admitting: Internal Medicine

## 2012-03-18 ENCOUNTER — Ambulatory Visit (INDEPENDENT_AMBULATORY_CARE_PROVIDER_SITE_OTHER): Payer: Medicare Other | Admitting: Internal Medicine

## 2012-03-18 VITALS — BP 149/81 | HR 73 | Temp 98.0°F | Resp 18 | Wt 183.0 lb

## 2012-03-18 DIAGNOSIS — R599 Enlarged lymph nodes, unspecified: Secondary | ICD-10-CM | POA: Insufficient documentation

## 2012-03-18 DIAGNOSIS — R221 Localized swelling, mass and lump, neck: Secondary | ICD-10-CM

## 2012-03-18 DIAGNOSIS — R220 Localized swelling, mass and lump, head: Secondary | ICD-10-CM

## 2012-03-18 DIAGNOSIS — R739 Hyperglycemia, unspecified: Secondary | ICD-10-CM

## 2012-03-18 DIAGNOSIS — R7309 Other abnormal glucose: Secondary | ICD-10-CM

## 2012-03-18 DIAGNOSIS — N390 Urinary tract infection, site not specified: Secondary | ICD-10-CM

## 2012-03-18 DIAGNOSIS — IMO0001 Reserved for inherently not codable concepts without codable children: Secondary | ICD-10-CM

## 2012-03-18 LAB — POCT URINALYSIS DIPSTICK
Bilirubin, UA: NEGATIVE
Blood, UA: NEGATIVE
Glucose, UA: NEGATIVE
Ketones, UA: NEGATIVE
Leukocytes, UA: NEGATIVE
Nitrite, UA: NEGATIVE
Protein, UA: NEGATIVE
Spec Grav, UA: 1.015
Urobilinogen, UA: NEGATIVE
pH, UA: 6.5

## 2012-03-18 LAB — GLUCOSE, POCT (MANUAL RESULT ENTRY): POC Glucose: 276 mg/dl — AB (ref 70–99)

## 2012-03-18 MED ORDER — IOHEXOL 300 MG/ML  SOLN
75.0000 mL | Freq: Once | INTRAMUSCULAR | Status: AC | PRN
  Administered 2012-03-18: 75 mL via INTRAVENOUS

## 2012-03-18 NOTE — Patient Instructions (Signed)
Check glucoses bid  To have CT scan today  See me upon your return from your trip

## 2012-03-18 NOTE — Progress Notes (Signed)
Subjective:    Patient ID: Jessica Dennis, female    DOB: [birth year], 79 y.o.   MRN: [id number]  HPI  Jessica Dennis is here for follow up of new onset diabetes.   She records her glucoses bid and they have ranged from 172-258 .  She take Metformin 500 in the am  NO urinary symptoms.  She has one day left of her Keflex given to her from ER  She reports a lump in R side of her neck that has been present  For 2-3 months  Painless no fever.  She does report drinking wine or liquor  daily  No Known Allergies Past Medical History  Diagnosis Date  . Anemia   . Anxiety   . Arthritis   . Hypertension   . Thyroid disease   . Borderline diabetes   . Diabetes mellitus without complication    Past Surgical History  Procedure Laterality Date  . Abdominal hysterectomy    . Joint replacement  97 and 06    Lt and RT hip  . Appendectomy    . Tonsillectomy     History   Social History  . Marital Status: Divorced    Spouse Name: N/A    Number of Children: N/A  . Years of Education: N/A   Occupational History  . Not on file.   Social History Main Topics  . Smoking status: Never Smoker   . Smokeless tobacco: Never Used  . Alcohol Use: Yes     Comment: glass of wine daily  . Drug Use: No  . Sexually Active: No   Other Topics Concern  . Not on file   Social History Narrative  . No narrative on file   Family History  Problem Relation Age of Onset  . Cancer Father     bladder  . Heart attack Father    Patient Active Problem List  Diagnosis  . History of anemia  . Anxiety  . Essential hypertension, benign  . GERD (gastroesophageal reflux disease)  . Other and unspecified hyperlipidemia  . History of hysterectomy  . Menopause  . Hypothyroidism  . Glaucoma  . DJD (degenerative joint disease)  . Hyperglycemia  . Diabetes mellitus, new onset   Current Outpatient Prescriptions on File Prior to Visit  Medication Sig Dispense Refill  . calcium carbonate (OS-CAL) 1250 MG chewable  tablet Chew 1 tablet by mouth daily.      . cephALEXin (KEFLEX) 250 MG capsule Take 1 capsule (250 mg total) by mouth 4 (four) times daily.  28 capsule  0  . furosemide (LASIX) 20 MG tablet Take 1 tablet (20 mg total) by mouth daily.  90 tablet  0  . glucose monitoring kit (FREESTYLE) monitoring kit 1 each by Does not apply route as needed for other.  1 each  0  . levothyroxine (SYNTHROID, LEVOTHROID) 75 MCG tablet Take 1 tablet (75 mcg total) by mouth daily.  90 tablet  1  . LORazepam (ATIVAN) 0.5 MG tablet Bid as needed  60 tablet  3  . metFORMIN (GLUCOPHAGE XR) 500 MG 24 hr tablet Take 1 tablet (500 mg total) by mouth daily with breakfast.  30 tablet  0  . naproxen (NAPROSYN) 500 MG tablet Take 1 tablet (500 mg total) by mouth 2 (two) times daily with a meal.  60 tablet  0  . pantoprazole (PROTONIX) 40 MG tablet Take 1 tablet (40 mg total) by mouth daily.  90 tablet  1  . quinapril (ACCUPRIL) 40  MG tablet TAKE 1 TABLET (40 MG TOTAL) BY MOUTH AT BEDTIME.  90 tablet  0  . Prednicarbate 0.1 % CREA Apply topically.      . travoprost, benzalkonium, (TRAVATAN) 0.004 % ophthalmic solution 1 drop at bedtime.       No current facility-administered medications on file prior to visit.     Review of Systems     Objective:   Physical Exam Physical Exam  Nursing note and vitals reviewed.  Constitutional: She is oriented to person, place, and time. She appears well-developed and well-nourished.  HENT:  Head: Normocephalic and atraumatic.  Neck  She has mass R upper cervical region near floor of mouth.  NO other lymphadenopathy no clavicular adenopathy Cardiovascular: Normal rate and regular rhythm. Exam reveals no gallop and no friction rub.  No murmur heard.  Pulmonary/Chest: Breath sounds normal. She has no wheezes. She has no rales.  Neurological: She is alert and oriented to person, place, and time.  Skin: Skin is warm and dry.  Psychiatric: She has a normal mood and affect. Her behavior is  normal.         Assessment & Plan:  New onset diabetes  .  Will increase Metformin to 500 mg bid. She is to check glucoses bid M,W,Fri Refer to diabetes education.  Counseled signs and symptoms of hypo and hyperglycemia.    Mass R side of neck  Will get CT with contrast today  Recent UTI  U/a today normal  Advised to finish antibiotics.   She is going out of country for a week.  ADvised to see me in office upon her return

## 2012-03-19 ENCOUNTER — Telehealth: Payer: Self-pay | Admitting: Internal Medicine

## 2012-03-19 NOTE — Telephone Encounter (Signed)
Spoke with pt and informed of CT results.    She is going out of town and has a follow up appt with me upon her return.  Will set up ENT referral at that time.  She voices understanding of importance of ENT referrall and to keep appt with me

## 2012-03-31 NOTE — Telephone Encounter (Signed)
Follow up.

## 2012-04-01 ENCOUNTER — Encounter: Payer: Self-pay | Admitting: Internal Medicine

## 2012-04-01 ENCOUNTER — Ambulatory Visit (INDEPENDENT_AMBULATORY_CARE_PROVIDER_SITE_OTHER): Payer: Medicare Other | Admitting: Internal Medicine

## 2012-04-01 VITALS — BP 138/76 | HR 74 | Temp 97.4°F | Resp 18 | Wt 178.0 lb

## 2012-04-01 DIAGNOSIS — R591 Generalized enlarged lymph nodes: Secondary | ICD-10-CM

## 2012-04-01 DIAGNOSIS — I10 Essential (primary) hypertension: Secondary | ICD-10-CM

## 2012-04-01 DIAGNOSIS — Z87440 Personal history of urinary (tract) infections: Secondary | ICD-10-CM

## 2012-04-01 DIAGNOSIS — E119 Type 2 diabetes mellitus without complications: Secondary | ICD-10-CM

## 2012-04-01 DIAGNOSIS — R599 Enlarged lymph nodes, unspecified: Secondary | ICD-10-CM

## 2012-04-01 DIAGNOSIS — R590 Localized enlarged lymph nodes: Secondary | ICD-10-CM

## 2012-04-01 DIAGNOSIS — R7309 Other abnormal glucose: Secondary | ICD-10-CM

## 2012-04-01 DIAGNOSIS — R739 Hyperglycemia, unspecified: Secondary | ICD-10-CM

## 2012-04-01 LAB — POCT URINALYSIS DIPSTICK
Bilirubin, UA: NEGATIVE
Blood, UA: NEGATIVE
Glucose, UA: NEGATIVE
Ketones, UA: NEGATIVE
Leukocytes, UA: NEGATIVE
Nitrite, UA: NEGATIVE
Protein, UA: NEGATIVE
Spec Grav, UA: 1.01
Urobilinogen, UA: NEGATIVE
pH, UA: 6

## 2012-04-01 LAB — GLUCOSE, POCT (MANUAL RESULT ENTRY): POC Glucose: 170 mg/dl — AB (ref 70–99)

## 2012-04-01 MED ORDER — METFORMIN HCL ER 500 MG PO TB24: ORAL_TABLET | ORAL | Status: DC

## 2012-04-01 NOTE — Progress Notes (Signed)
Subjective:    Patient ID: Jessica Dennis, female    DOB: 1932/10/05, 79 y.o.   MRN: [id number]  HPI Jessica Dennis is very proud as her FBS have been ranging 130-140 since she has returned from her vacation.  She brings her log and glucoses higher on vacation but relatively close to goal since her return.  She is tolerating metformin bid now.  She has not started diabetes classes as yet.  She is on an Ace inhibitor  She reports less fatigue since controlling her glucoses  Still feels swelling under her chin on the R side  See CT  She does have slightly enlarded lymph node near sublingual gland on R side.    She did take her Keflex   Review of Systems See HPI    No Known Allergies Past Medical History  Diagnosis Date  . Anemia   . Anxiety   . Arthritis   . Hypertension   . Thyroid disease   . Borderline diabetes   . Diabetes mellitus without complication    Past Surgical History  Procedure Laterality Date  . Abdominal hysterectomy    . Joint replacement  97 and 06    Lt and RT hip  . Appendectomy    . Tonsillectomy     History   Social History  . Marital Status: Divorced    Spouse Name: N/A    Number of Children: N/A  . Years of Education: N/A   Occupational History  . Not on file.   Social History Main Topics  . Smoking status: Never Smoker   . Smokeless tobacco: Never Used  . Alcohol Use: Yes     Comment: glass of wine daily  . Drug Use: No  . Sexually Active: No   Other Topics Concern  . Not on file   Social History Narrative  . No narrative on file   Family History  Problem Relation Age of Onset  . Cancer Father     bladder  . Heart attack Father    Patient Active Problem List  Diagnosis  . History of anemia  . Anxiety  . Essential hypertension, benign  . GERD (gastroesophageal reflux disease)  . Other and unspecified hyperlipidemia  . History of hysterectomy  . Menopause  . Hypothyroidism  . Glaucoma  . DJD (degenerative joint disease)  .  Hyperglycemia  . Diabetes mellitus, new onset   Current Outpatient Prescriptions on File Prior to Visit  Medication Sig Dispense Refill  . calcium carbonate (OS-CAL) 1250 MG chewable tablet Chew 1 tablet by mouth daily.      . cephALEXin (KEFLEX) 250 MG capsule Take 1 capsule (250 mg total) by mouth 4 (four) times daily.  28 capsule  0  . furosemide (LASIX) 20 MG tablet Take 1 tablet (20 mg total) by mouth daily.  90 tablet  0  . glucose monitoring kit (FREESTYLE) monitoring kit 1 each by Does not apply route as needed for other.  1 each  0  . levothyroxine (SYNTHROID, LEVOTHROID) 75 MCG tablet Take 1 tablet (75 mcg total) by mouth daily.  90 tablet  1  . LORazepam (ATIVAN) 0.5 MG tablet Bid as needed  60 tablet  3  . naproxen (NAPROSYN) 500 MG tablet Take 1 tablet (500 mg total) by mouth 2 (two) times daily with a meal.  60 tablet  0  . pantoprazole (PROTONIX) 40 MG tablet Take 1 tablet (40 mg total) by mouth daily.  90 tablet  1  .  Prednicarbate 0.1 % CREA Apply topically.      . quinapril (ACCUPRIL) 40 MG tablet TAKE 1 TABLET (40 MG TOTAL) BY MOUTH AT BEDTIME.  90 tablet  0  . travoprost, benzalkonium, (TRAVATAN) 0.004 % ophthalmic solution 1 drop at bedtime.       No current facility-administered medications on file prior to visit.     Objective:   Physical Exam  Physical Exam  Nursing note and vitals reviewed.  Constitutional: She is oriented to person, place, and time. She appears well-developed and well-nourished.  HENT:  Head: Normocephalic and atraumatic.  Neck  She still has slight lymphadenopathy on the R side near sublingual gland Cardiovascular: Normal rate and regular rhythm. Exam reveals no gallop and no friction rub.  No murmur heard.  Pulmonary/Chest: Breath sounds normal. She has no wheezes. She has no rales.  Neurological: She is alert and oriented to person, place, and time.  Skin: Skin is warm and dry.  Psychiatric: She has a normal mood and affect. Her  behavior is normal.             Assessment & Plan:  Diabetes Type II  Counseled I need to see her every 3 months.  Will check AIC at that time.  Start Diabetes education classes.  FAsting glcucoses doing well  Continue BID metformin     Cervical lymphadenoapathy  Will refer to ENT  HTN  goodcontrol

## 2012-04-01 NOTE — Patient Instructions (Addendum)
Dermatologists  Dr.  Sharyn Lull  [phone]    Or [facility] dermatology  [phone]

## 2012-04-14 ENCOUNTER — Other Ambulatory Visit: Payer: Self-pay | Admitting: *Deleted

## 2012-04-14 NOTE — Telephone Encounter (Signed)
Refill request pt would like to [facility]

## 2012-04-15 MED ORDER — PANTOPRAZOLE SODIUM 40 MG PO TBEC: 40.0000 mg | DELAYED_RELEASE_TABLET | Freq: Every day | ORAL | Status: DC

## 2012-04-15 MED ORDER — LEVOTHYROXINE SODIUM 75 MCG PO TABS: 75.0000 ug | ORAL_TABLET | Freq: Every day | ORAL | Status: DC

## 2012-04-15 MED ORDER — FUROSEMIDE 20 MG PO TABS: 20.0000 mg | ORAL_TABLET | Freq: Every day | ORAL | Status: DC

## 2012-04-23 ENCOUNTER — Encounter: Payer: Self-pay | Admitting: *Deleted

## 2012-04-23 ENCOUNTER — Encounter: Payer: Medicare Other | Attending: Internal Medicine | Admitting: *Deleted

## 2012-04-23 VITALS — Ht 67.0 in | Wt 175.4 lb

## 2012-04-23 DIAGNOSIS — E119 Type 2 diabetes mellitus without complications: Secondary | ICD-10-CM | POA: Insufficient documentation

## 2012-04-23 DIAGNOSIS — Z713 Dietary counseling and surveillance: Secondary | ICD-10-CM | POA: Insufficient documentation

## 2012-04-23 NOTE — Patient Instructions (Signed)
  Goals:  1. 3 carb servings at meals, 1-2 servings at snacks.  2. Read nutrition labels for total carbohydrates.  3. Monitor portion size of carb foods.  4. Continue exercising at least 30 minutes 4 days weekly.

## 2012-04-23 NOTE — Progress Notes (Signed)
Medical Nutrition Therapy:  Appt start time: 1415 end time:  1515.  Assessment:  Primary concern today: type 2 diabetes. Patient is newly diagnosed with type 2 diabetes in February. She reports that she has been monitoring her intake, trying to limit sugar in her diet. She does report drinking alcohol (wine or cocktails) most days of the week (1-3 drinks daily depending on if she is home or out with friends).  She has exercised regularly in the past, but has not gone over the last month due to travel. She is now getting back into it. She checks her BG BID with widely fluctuating values from 120-180s. HgbA1c in September 2013 was 6.1. She reports a weight loss of about 9 pounds over 6 weeks.   WEIGHT: 175.4 pounds BMI: 27.5  MEDICATIONS: Metformin 500 mg BID, levothyroxine, pantoprazole, furosemide   DIETARY INTAKE:   Usual eating pattern includes 3 meals and 1-2 snacks per day.  24-hr recall:  B ( AM): Banana  Snk ( AM): None  L ( PM): Egg salad/ham pita, water Snk ( PM): None D ( PM): Pasta with salad/chicken, wine Snk ( PM): Fruit, sometimes with yogurt Beverages: Water, wine, gin and tonic  Usual physical activity: Gym 4 days weekly, 30 min bike, 30 min treadmill  Estimated energy needs: 1600 calories 180 g carbohydrates 120 g protein 44 g fat  Progress Towards Goal(s):  In progress.   Nutritional Diagnosis:  NB-1.1 Food and nutrition-related knowledge deficit As related to newly diagnosed diabetes.  As evidenced by no prior education.    Intervention:  Nutrition counseling. We discussed basic carb counting, including foods with carbs, label reading, portion size, and meal planning.   Goals:  1. 3 carb servings at meals, 1-2 servings at snacks.  2. Read nutrition labels for total carbohydrates.  3. Monitor portion size of carb foods.  4. Continue exercising at least 30 minutes 4 days weekly.   Handouts given during visit include:  Carb Counting booklet  Yellow portion  card  Monitoring/Evaluation:  Dietary intake, exercise, blood glucose, and body weight prn. Patient wants to wait until after Easter to set up a follow-up appointment.

## 2012-04-26 ENCOUNTER — Other Ambulatory Visit: Payer: Self-pay | Admitting: Internal Medicine

## 2012-04-28 NOTE — Telephone Encounter (Signed)
Refill request

## 2012-04-28 NOTE — Telephone Encounter (Signed)
[employer]  I note she does not have a follow up appt with me.  Give her an appt end of May to see me   Leave 30 min

## 2012-06-30 ENCOUNTER — Other Ambulatory Visit: Payer: Self-pay | Admitting: Internal Medicine

## 2012-06-30 NOTE — Telephone Encounter (Signed)
Refill request last visit 04/01/12 does she need a follow up appt for recheck of A1C?

## 2012-07-15 ENCOUNTER — Other Ambulatory Visit: Payer: Self-pay | Admitting: *Deleted

## 2012-07-15 MED ORDER — FUROSEMIDE 20 MG PO TABS: 20.0000 mg | ORAL_TABLET | Freq: Every day | ORAL | Status: DC

## 2012-07-15 MED ORDER — LORAZEPAM 0.5 MG PO TABS: ORAL_TABLET | ORAL | Status: DC

## 2012-07-15 NOTE — Telephone Encounter (Signed)
Will call in 60 tab with no refills

## 2012-07-16 ENCOUNTER — Telehealth: Payer: Self-pay | Admitting: *Deleted

## 2012-07-17 ENCOUNTER — Other Ambulatory Visit: Payer: Self-pay | Admitting: Internal Medicine

## 2012-07-18 ENCOUNTER — Other Ambulatory Visit: Payer: Self-pay | Admitting: Internal Medicine

## 2012-07-18 MED ORDER — PANTOPRAZOLE SODIUM 40 MG PO TBEC: 40.0000 mg | DELAYED_RELEASE_TABLET | Freq: Every day | ORAL | Status: DC

## 2012-07-23 ENCOUNTER — Encounter: Payer: Self-pay | Admitting: Internal Medicine

## 2012-07-23 ENCOUNTER — Ambulatory Visit (INDEPENDENT_AMBULATORY_CARE_PROVIDER_SITE_OTHER): Payer: Medicare Other | Admitting: Internal Medicine

## 2012-07-23 VITALS — BP 130/86 | HR 81 | Temp 97.7°F | Resp 16 | Wt 171.0 lb

## 2012-07-23 DIAGNOSIS — R7309 Other abnormal glucose: Secondary | ICD-10-CM

## 2012-07-23 DIAGNOSIS — N951 Menopausal and female climacteric states: Secondary | ICD-10-CM

## 2012-07-23 DIAGNOSIS — E785 Hyperlipidemia, unspecified: Secondary | ICD-10-CM

## 2012-07-23 DIAGNOSIS — R739 Hyperglycemia, unspecified: Secondary | ICD-10-CM

## 2012-07-23 DIAGNOSIS — Z780 Asymptomatic menopausal state: Secondary | ICD-10-CM

## 2012-07-23 DIAGNOSIS — E039 Hypothyroidism, unspecified: Secondary | ICD-10-CM

## 2012-07-23 DIAGNOSIS — I10 Essential (primary) hypertension: Secondary | ICD-10-CM

## 2012-07-23 DIAGNOSIS — E119 Type 2 diabetes mellitus without complications: Secondary | ICD-10-CM

## 2012-07-23 LAB — LIPID PANEL
Cholesterol: 196 mg/dL (ref 0–200)
HDL: 62 mg/dL (ref >39)
LDL Cholesterol: 118 mg/dL — ABNORMAL HIGH (ref 0–99)
Total CHOL/HDL Ratio: 3.2 Ratio
Triglycerides: 82 mg/dL (ref <150)
VLDL: 16 mg/dL (ref 0–40)

## 2012-07-23 LAB — CBC WITH DIFFERENTIAL/PLATELET
Basophils Absolute: 0 10*3/uL (ref 0.0–0.1)
Basophils Relative: 1 % (ref 0–1)
Eosinophils Absolute: 0.2 10*3/uL (ref 0.0–0.7)
Eosinophils Relative: 4 % (ref 0–5)
HCT: 41.8 % (ref 36.0–46.0)
Hemoglobin: 14.6 g/dL (ref 12.0–15.0)
Lymphocytes Relative: 21 % (ref 12–46)
Lymphs Abs: 1.2 10*3/uL (ref 0.7–4.0)
MCH: 32.3 pg (ref 26.0–34.0)
MCHC: 34.9 g/dL (ref 30.0–36.0)
MCV: 92.5 fL (ref 78.0–100.0)
Monocytes Absolute: 0.5 10*3/uL (ref 0.1–1.0)
Monocytes Relative: 8 % (ref 3–12)
Neutro Abs: 4 10*3/uL (ref 1.7–7.7)
Neutrophils Relative %: 66 % (ref 43–77)
Platelets: 84 10*3/uL — ABNORMAL LOW (ref 150–400)
RBC: 4.52 MIL/uL (ref 3.87–5.11)
RDW: 13.7 % (ref 11.5–15.5)
WBC: 6 10*3/uL (ref 4.0–10.5)

## 2012-07-23 LAB — COMPREHENSIVE METABOLIC PANEL
ALT: 29 U/L (ref 0–35)
AST: 38 U/L — ABNORMAL HIGH (ref 0–37)
Albumin: 4.6 g/dL (ref 3.5–5.2)
Alkaline Phosphatase: 100 U/L (ref 39–117)
BUN: 19 mg/dL (ref 6–23)
CO2: 29 mEq/L (ref 19–32)
Calcium: 9.9 mg/dL (ref 8.4–10.5)
Chloride: 102 mEq/L (ref 96–112)
Creat: 0.91 mg/dL (ref 0.50–1.10)
Glucose, Bld: 124 mg/dL — ABNORMAL HIGH (ref 70–99)
Potassium: 4.4 mEq/L (ref 3.5–5.3)
Sodium: 140 mEq/L (ref 135–145)
Total Bilirubin: 1.6 mg/dL — ABNORMAL HIGH (ref 0.3–1.2)
Total Protein: 7 g/dL (ref 6.0–8.3)

## 2012-07-23 LAB — TSH: TSH: 3.375 u[IU]/mL (ref 0.350–4.500)

## 2012-07-23 LAB — HEMOGLOBIN A1C
Hgb A1c MFr Bld: 5.3 % (ref <5.7)
Mean Plasma Glucose: 105 mg/dL (ref <117)

## 2012-07-23 NOTE — Patient Instructions (Addendum)
See me in 3-4 months 

## 2012-07-23 NOTE — Progress Notes (Signed)
Subjective:    Patient ID: Jessica Dennis, female    DOB: 1932/10/31, 79 y.o.   MRN: [id number]  HPI Jessica Dennis is here for diabetes follow up.  FBS ranging  120's to 140's  Rare 200's per her report  Tolerating glucophage well.  She has appt with opthalmologist next week. No visual changes,  No extremity numbness or pain  She did see nutritionist, she has increased her exercise, eliminated sugar in diet and has lost 7 lbs.  She tell me she does drink gin or wine every day  No Known Allergies Past Medical History  Diagnosis Date  . Anemia   . Anxiety   . Arthritis   . Hypertension   . Thyroid disease   . Borderline diabetes   . Diabetes mellitus without complication    Past Surgical History  Procedure Laterality Date  . Abdominal hysterectomy    . Joint replacement  97 and 06    Lt and RT hip  . Appendectomy    . Tonsillectomy     History   Social History  . Marital Status: Divorced    Spouse Name: N/A    Number of Children: N/A  . Years of Education: N/A   Occupational History  . Not on file.   Social History Main Topics  . Smoking status: Never Smoker   . Smokeless tobacco: Never Used  . Alcohol Use: Yes     Comment: glass of wine daily  . Drug Use: No  . Sexually Active: No   Other Topics Concern  . Not on file   Social History Narrative  . No narrative on file   Family History  Problem Relation Age of Onset  . Cancer Father     bladder  . Heart attack Father    Patient Active Problem List   Diagnosis Date Noted  . Lymphadenopathy of right cervical region 04/01/2012  . Diabetes mellitus, new onset 03/13/2012  . Hyperglycemia 09/19/2011  . History of anemia 06/28/2011  . Anxiety 06/28/2011  . Essential hypertension, benign 06/28/2011  . GERD (gastroesophageal reflux disease) 06/28/2011  . Other and unspecified hyperlipidemia 06/28/2011  . History of hysterectomy 06/28/2011  . Menopause 06/28/2011  . Hypothyroidism 06/28/2011  . Glaucoma  06/28/2011  . DJD (degenerative joint disease) 06/28/2011   Current Outpatient Prescriptions on File Prior to Visit  Medication Sig Dispense Refill  . calcium carbonate (OS-CAL) 1250 MG chewable tablet Chew 1 tablet by mouth daily.      . furosemide (LASIX) 20 MG tablet Take 1 tablet (20 mg total) by mouth daily.  30 tablet  0  . glucose monitoring kit (FREESTYLE) monitoring kit 1 each by Does not apply route as needed for other.  1 each  0  . levothyroxine (SYNTHROID, LEVOTHROID) 75 MCG tablet TAKE 1 TABLET (75 MCG TOTAL) BY MOUTH DAILY.  30 tablet  1  . LORazepam (ATIVAN) 0.5 MG tablet Bid as needed  60 tablet  0  . metFORMIN (GLUCOPHAGE-XR) 500 MG 24 hr tablet TAKE 1 TABLET BY MOUTH TWICE DAILY  180 tablet  0  . pantoprazole (PROTONIX) 40 MG tablet Take 1 tablet (40 mg total) by mouth daily.  30 tablet  0  . Prednicarbate 0.1 % CREA Apply topically.      . quinapril (ACCUPRIL) 40 MG tablet TAKE 1 TABLET (40 MG TOTAL) BY MOUTH AT BEDTIME.  30 tablet  1  . travoprost, benzalkonium, (TRAVATAN) 0.004 % ophthalmic solution 1 drop at bedtime.  No current facility-administered medications on file prior to visit.       Review of Systems See HPI    Objective:   Physical Exam Physical Exam  Nursing note and vitals reviewed.  Constitutional: She is oriented to person, place, and time. She appears well-developed and well-nourished.  HENT:  Head: Normocephalic and atraumatic.  Cardiovascular: Normal rate and regular rhythm. Exam reveals no gallop and no friction rub.  No murmur heard.  Pulmonary/Chest: Breath sounds normal. She has no wheezes. She has no rales.  Neurological: She is alert and oriented to person, place, and time. Foot exam  Normal microfilament,  No skin rash or ulcers .  Good bilateral pedal pulses Skin: Skin is warm and dry.  Psychiatric: She has a normal mood and affect. Her behavior is normal.             Assessment & Plan:  DM:  Continue glucophage,  optho exam next week,  Foot exam normal .    See me in 3 months.  Will get micral next visit.   On ace I   Advised to limit ETOH to one drink daily  HTN good control  Continue meds  Hypothyrodisim  Continue meds  See me 3-4 months

## 2012-07-24 LAB — VITAMIN D 25 HYDROXY (VIT D DEFICIENCY, FRACTURES): Vit D, 25-Hydroxy: 45 ng/mL (ref 30–89)

## 2012-07-31 ENCOUNTER — Other Ambulatory Visit: Payer: Self-pay

## 2012-08-07 ENCOUNTER — Ambulatory Visit: Payer: Medicare Other | Admitting: Internal Medicine

## 2012-08-14 ENCOUNTER — Encounter: Payer: Self-pay | Admitting: Internal Medicine

## 2012-08-14 ENCOUNTER — Ambulatory Visit (INDEPENDENT_AMBULATORY_CARE_PROVIDER_SITE_OTHER): Payer: Medicare Other | Admitting: Internal Medicine

## 2012-08-14 VITALS — BP 121/66 | HR 87 | Temp 97.6°F | Resp 18 | Wt 171.0 lb

## 2012-08-14 DIAGNOSIS — D696 Thrombocytopenia, unspecified: Secondary | ICD-10-CM

## 2012-08-14 DIAGNOSIS — Z862 Personal history of diseases of the blood and blood-forming organs and certain disorders involving the immune mechanism: Secondary | ICD-10-CM

## 2012-08-14 DIAGNOSIS — I10 Essential (primary) hypertension: Secondary | ICD-10-CM

## 2012-08-14 NOTE — Progress Notes (Signed)
Subjective:    Patient ID: Jessica Dennis, female    DOB: 1932/06/26, 79 y.o.   MRN: [id number]  HPI  Jessica Dennis is here for follow up and to discuss low platelets.    She is doing very well with Diabetes control  See AIC  She has had thrombocytopenia  for several months now.  She does note she bruises easily as her "skin is so thin"  And does not recall having a history of low platelets  .  Most recent check her platelets have improved to 84K.  Her other cell lines are normal but she does report having been denied donating blood as she was anemic in the past  Only new medication is metformin but her thrombocytopenia pre-dates this.  Jessica Dennis does report she likes to drink gin and tonic and wine.  She will have 2-3 glasses when she is by herself but reports when she is with her friends she drinks  "much more"  No Known Allergies Past Medical History  Diagnosis Date  . Anemia   . Anxiety   . Arthritis   . Hypertension   . Thyroid disease   . Borderline diabetes   . Diabetes mellitus without complication    Past Surgical History  Procedure Laterality Date  . Abdominal hysterectomy    . Joint replacement  97 and 06    Lt and RT hip  . Appendectomy    . Tonsillectomy     History   Social History  . Marital Status: Divorced    Spouse Name: N/A    Number of Children: N/A  . Years of Education: N/A   Occupational History  . Not on file.   Social History Main Topics  . Smoking status: Never Smoker   . Smokeless tobacco: Never Used  . Alcohol Use: Yes     Comment: glass of wine daily  . Drug Use: No  . Sexually Active: No   Other Topics Concern  . Not on file   Social History Narrative  . No narrative on file   Family History  Problem Relation Age of Onset  . Cancer Father     bladder  . Heart attack Father    Patient Active Problem List   Diagnosis Date Noted  . Thrombocytopenia, unspecified 08/14/2012  . Lymphadenopathy of right cervical region 04/01/2012  .  Diabetes mellitus, new onset 03/13/2012  . Hyperglycemia 09/19/2011  . History of anemia 06/28/2011  . Anxiety 06/28/2011  . Essential hypertension, benign 06/28/2011  . GERD (gastroesophageal reflux disease) 06/28/2011  . Other and unspecified hyperlipidemia 06/28/2011  . History of hysterectomy 06/28/2011  . Menopause 06/28/2011  . Hypothyroidism 06/28/2011  . Glaucoma 06/28/2011  . DJD (degenerative joint disease) 06/28/2011   Current Outpatient Prescriptions on File Prior to Visit  Medication Sig Dispense Refill  . calcium carbonate (OS-CAL) 1250 MG chewable tablet Chew 1 tablet by mouth daily.      . furosemide (LASIX) 20 MG tablet Take 1 tablet (20 mg total) by mouth daily.  30 tablet  0  . glucose monitoring kit (FREESTYLE) monitoring kit 1 each by Does not apply route as needed for other.  1 each  0  . levothyroxine (SYNTHROID, LEVOTHROID) 75 MCG tablet TAKE 1 TABLET (75 MCG TOTAL) BY MOUTH DAILY.  30 tablet  1  . LORazepam (ATIVAN) 0.5 MG tablet Bid as needed  60 tablet  0  . metFORMIN (GLUCOPHAGE-XR) 500 MG 24 hr tablet TAKE 1 TABLET BY MOUTH TWICE DAILY  180 tablet  0  . pantoprazole (PROTONIX) 40 MG tablet Take 1 tablet (40 mg total) by mouth daily.  30 tablet  0  . Prednicarbate 0.1 % CREA Apply topically.      . quinapril (ACCUPRIL) 40 MG tablet TAKE 1 TABLET (40 MG TOTAL) BY MOUTH AT BEDTIME.  30 tablet  1  . travoprost, benzalkonium, (TRAVATAN) 0.004 % ophthalmic solution 1 drop at bedtime.       No current facility-administered medications on file prior to visit.        Review of Systems    see HPI Objective:   Physical Exam Physical Exam  Nursing note and vitals reviewed.  Constitutional: She is oriented to person, place, and time. She appears well-developed and well-nourished.  HENT:  Head: Normocephalic and atraumatic.  Cardiovascular: Normal rate and regular rhythm. Exam reveals no gallop and no friction rub.  No murmur heard.  Pulmonary/Chest: Breath  sounds normal. She has no wheezes. She has no rales. Abd:  Soft NT/ND  No splenomegaly no hepatomegaly  Neurological: She is alert and oriented to person, place, and time.  Skin: Skin is warm and dry.   She does have multiple ecchymotic areas on both forearms Psychiatric: She has a normal mood and affect. Her behavior is normal.             Assessment & Plan:  Thrombocytopenia    She really does not have any medication culprits or infectious etiologies that I can identify .  ETOH use is certainly a contributor. Will get hematology opinion to rule out ITP  ETOH use  Advised to limit ETOH use to maxiumum of 1-2 glasses nightly or even 2-3 times per week  HTN  Good control

## 2012-08-15 ENCOUNTER — Telehealth: Payer: Self-pay | Admitting: Hematology & Oncology

## 2012-08-15 NOTE — Telephone Encounter (Signed)
Pt made 9-17 appointment she wanted this day

## 2012-08-18 ENCOUNTER — Other Ambulatory Visit: Payer: Self-pay | Admitting: *Deleted

## 2012-08-18 ENCOUNTER — Other Ambulatory Visit: Payer: Self-pay | Admitting: Internal Medicine

## 2012-08-18 NOTE — Telephone Encounter (Signed)
Refill request pt would like a 90 day supply

## 2012-08-18 NOTE — Telephone Encounter (Signed)
Refill request

## 2012-08-19 MED ORDER — PANTOPRAZOLE SODIUM 40 MG PO TBEC: 40.0000 mg | DELAYED_RELEASE_TABLET | Freq: Every day | ORAL | Status: DC

## 2012-08-19 MED ORDER — LEVOTHYROXINE SODIUM 75 MCG PO TABS: ORAL_TABLET | ORAL | Status: DC

## 2012-09-03 ENCOUNTER — Encounter: Payer: Self-pay | Admitting: Internal Medicine

## 2012-09-03 DIAGNOSIS — C4491 Basal cell carcinoma of skin, unspecified: Secondary | ICD-10-CM

## 2012-09-03 DIAGNOSIS — C4492 Squamous cell carcinoma of skin, unspecified: Secondary | ICD-10-CM | POA: Insufficient documentation

## 2012-09-03 HISTORY — DX: Basal cell carcinoma of skin, unspecified: C44.91

## 2012-09-08 ENCOUNTER — Encounter: Payer: Self-pay | Admitting: *Deleted

## 2012-09-09 NOTE — Telephone Encounter (Signed)
appt reminder

## 2012-09-15 ENCOUNTER — Other Ambulatory Visit: Payer: Self-pay | Admitting: *Deleted

## 2012-09-15 MED ORDER — LORAZEPAM 0.5 MG PO TABS: ORAL_TABLET | ORAL | Status: DC

## 2012-09-15 NOTE — Telephone Encounter (Signed)
Will call in pending approval 

## 2012-09-15 NOTE — Telephone Encounter (Signed)
Jessica Dennis  OK to call in  I changed her to #30 tablets instead of #60

## 2012-09-16 NOTE — Telephone Encounter (Signed)
Refill request

## 2012-09-17 ENCOUNTER — Telehealth: Payer: Self-pay | Admitting: *Deleted

## 2012-09-17 MED ORDER — POTASSIUM CHLORIDE ER 10 MEQ PO TBCR: 10.0000 meq | EXTENDED_RELEASE_TABLET | Freq: Every day | ORAL | Status: DC

## 2012-09-17 NOTE — Telephone Encounter (Signed)
LVM message to return call regarding lasix

## 2012-09-17 NOTE — Telephone Encounter (Signed)
[employer] and ask her is she is taking her Lasix every day.  If she is she needs to be on a K supplement which I do not see on her med list.    I did not re-order her meds   OK to call in Protonix 40 mg #90 with one refill  Talk to me about Lasix after you talk to pt

## 2012-09-17 NOTE — Telephone Encounter (Signed)
Lorazepam and protonix called in to pharmacy left message to return call

## 2012-09-25 ENCOUNTER — Other Ambulatory Visit: Payer: Self-pay | Admitting: Internal Medicine

## 2012-09-25 NOTE — Telephone Encounter (Signed)
Refill request

## 2012-10-03 ENCOUNTER — Other Ambulatory Visit: Payer: Self-pay | Admitting: Internal Medicine

## 2012-10-05 ENCOUNTER — Other Ambulatory Visit: Payer: Self-pay | Admitting: Internal Medicine

## 2012-10-06 NOTE — Telephone Encounter (Signed)
Refill request

## 2012-10-08 ENCOUNTER — Other Ambulatory Visit (HOSPITAL_BASED_OUTPATIENT_CLINIC_OR_DEPARTMENT_OTHER): Payer: Medicare Other | Admitting: Lab

## 2012-10-08 ENCOUNTER — Ambulatory Visit: Payer: Medicare Other

## 2012-10-08 ENCOUNTER — Ambulatory Visit (HOSPITAL_BASED_OUTPATIENT_CLINIC_OR_DEPARTMENT_OTHER): Payer: Medicare Other | Admitting: Hematology & Oncology

## 2012-10-08 VITALS — BP 152/63 | HR 79 | Temp 98.0°F | Resp 14 | Ht 67.0 in | Wt 173.0 lb

## 2012-10-08 DIAGNOSIS — D693 Immune thrombocytopenic purpura: Secondary | ICD-10-CM

## 2012-10-08 LAB — CBC WITH DIFFERENTIAL (CANCER CENTER ONLY)
BASO#: 0 10*3/uL (ref 0.0–0.2)
BASO%: 0.7 % (ref 0.0–2.0)
EOS%: 4.4 % (ref 0.0–7.0)
Eosinophils Absolute: 0.2 10*3/uL (ref 0.0–0.5)
HCT: 38.6 % (ref 34.8–46.6)
HGB: 13.2 g/dL (ref 11.6–15.9)
LYMPH#: 0.9 10*3/uL (ref 0.9–3.3)
LYMPH%: 20.2 % (ref 14.0–48.0)
MCH: 33.9 pg (ref 26.0–34.0)
MCHC: 34.2 g/dL (ref 32.0–36.0)
MCV: 99 fL (ref 81–101)
MONO#: 0.3 10*3/uL (ref 0.1–0.9)
MONO%: 7.5 % (ref 0.0–13.0)
NEUT#: 3 10*3/uL (ref 1.5–6.5)
NEUT%: 67.2 % (ref 39.6–80.0)
Platelets: 66 10*3/uL — ABNORMAL LOW (ref 145–400)
RBC: 3.89 10*6/uL (ref 3.70–5.32)
RDW: 13.4 % (ref 11.1–15.7)
WBC: 4.5 10*3/uL (ref 3.9–10.0)

## 2012-10-08 LAB — CHCC SATELLITE - SMEAR

## 2012-10-08 NOTE — Progress Notes (Signed)
This office note has been dictated.

## 2012-10-17 ENCOUNTER — Other Ambulatory Visit: Payer: Self-pay | Admitting: Internal Medicine

## 2012-10-20 NOTE — Telephone Encounter (Signed)
Will call in pending approval 

## 2012-10-21 ENCOUNTER — Other Ambulatory Visit: Payer: Self-pay | Admitting: *Deleted

## 2012-10-21 NOTE — Telephone Encounter (Signed)
Faxed RX for ativan

## 2012-10-21 NOTE — Progress Notes (Signed)
CC:   Kendrick Ranch, M.D.  DIAGNOSIS:  Thrombocytopenia.  HISTORY OF PRESENT ILLNESS:  Jessica Dennis is a very charming 79 year old [place] female.  She is not related to Bettey Costa of the [employer] on [employer].  She has been in the [place] since 1959.  She is from [place].  She is followed by Dr. Constance Goltz.  Dr. Constance Goltz has noted that she has had thrombocytopenia.  This was just done on a routine lab work.  [name] has never had any problems with bleeding or bruising.  She has not noted any obvious changes in her medications.  She has had no weight loss or weight gain.  She is not a vegetarian.  Going through some of her lab work, going back to August of 2013, her platelet count was 89,000.  White cell count was 5.6, with hemoglobin of 12.6.  Back in February of 2014, platelet count was 61,000.  She had a white cell count of 6.3, and hemoglobin 14.1.  Most recently, in July, a CBC was done which showed a white cell count of 6, hemoglobin 14.6, hematocrit 31.8 and platelet count 84,000.  She had a normal white cell differential.  Her MCV was 93.  [name] has had no problems with unusual joint aches or pains.  She has had no cough.  She has had no nausea or vomiting.  There has been no change in bowel or bladder habits.  She has been having her routine mammograms.  She is diabetic.  She is not on insulin.  She says her last hemoglobin A1c was, I think, 5.3.  She has had electrolytes that were all done.  These all looked fine. Her TSH was okay when checked recently.  Again, she is kindly referred to the [facility] for an evaluation.  She does enjoy her wine and beer.  Being from [place], this is how she was raised.  PAST MEDICAL HISTORY: 1. Non-insulin-dependent diabetes. 2. Hypothyroidism. 3. Hyperlipidemia. 4. Status post hysterectomy. 5. Anxiety.  ALLERGIES:  None.  MEDICATIONS:  Lasix 20 mg p.o. daily, Synthroid 0.075 mg p.o.  daily, Ativan 0.5 mg p.o. b.i.d. p.r.n., Glucophage 500 mg p.o. daily, Protonix 40 mg p.o. daily, Accupril 40 mg p.o. q.h.s. and Travatan eyedrops 0.004% at bedtime.  SOCIAL HISTORY:  Remarkable for alcohol use.  She does enjoy wine daily. She will have 2-3 glasses of wine a day.  She has also an occasional beer.  She has no tobacco use.  There is no obvious occupational exposures.  She used to work for [employer].  FAMILY HISTORY:  Negative for any kind of blood issues.  There is no cancer in the family.  There is diabetes and high blood pressure.  REVIEW OF SYSTEMS:  There are no additional findings other than that stated in the history of present illness.  She may have some easy bruising.  Again, there is no bleeding.  There is no change in bowel or bladder habits.  There is no headache.  There is no hair loss.  There is no sweats or chills.  She has had no weight loss or weight gain.  PHYSICAL EXAMINATION:  General:  This is an elderly, but well-nourished white female in no obvious distress.  Vital Signs:  Temperature of 98, pulse 79, respiratory rate 14, blood pressure 152/63.  Weight is 173 pounds.  Head and Neck:  Normocephalic, atraumatic skull.  There are no ocular or oral lesions.  There are no  palpable cervical or supraclavicular lymph nodes.  Lungs:  Clear to percussion and auscultation bilaterally.  Cardiac:  Regular rate and rhythm with a normal S1, S2.  There are no murmurs, rubs or bruits.  Abdomen:  Soft. She has good bowel sounds.  There is no palpable abdominal mass.  She has a well-healed laparotomy scar.  There is no fluid wave.  There is no palpable hepatosplenomegaly.  Axillary:  Shows no bilateral axillary adenopathy.  Back:  Shows no kyphosis.  There is no tenderness over the spine, ribs, or hips.  Extremities:  Show no clubbing, cyanosis or edema.  She has some osteoarthritic changes in her joints.  She has good strength in her arms and legs.   Skin:  Shows no rashes, ecchymosis, or petechia.  I see no suspicious hyperpigmented lesions.  Neurological: Shows no focal neurological deficits.  LABORATORY STUDIES:  White cell count is 4.5, hemoglobin 13.2, hematocrit 38.6, platelet count 66,000.  MCV is 99.  Peripheral smear, which I reviewed, shows a normochromic, normocytic population of red blood cells.  There are no nucleated red blood cells.  I see no teardrop cells.  There are no target cells.  I see no inclusion bodies.  She has no rouleaux formation.  There are no schistocytes or spherocytes.  White cells appear normal in morphology and maturation.  I see no hypersegmented polys.  There is no immature myeloid or lymphoid forms. There are no atypical lymphocytes.  She has no blasts.  Platelets are decreased in number.  She has mostly large platelets that are well granulated.  IMPRESSION:  [name] is a very charming 79 year old Caucasian female. She is from [place] originally.  She has mild thrombocytopenia.  This has been present for a year or so.  One has to suspect that this is going to be some type of immune based thrombocytopenia.  The platelets are large under the microscope on the blood smear.  Typically, this supports a destructive or peripheral consumptive process.  I cannot feel her liver or spleen, so I would not think an underlying bone marrow disorder would be possible. One possibility that I thought about was the potential for functional hypersplenism.  She does have fairly significant alcohol use.  I just wonder if she may not have some mild cirrhosis, and along with that, some functional hypersplenism that could be consuming the platelets.  I do not see any medicine that she is on that really would be implicated. I do not see anything that would suggest a collagen vascular disease.  I do not see anything that would suggest B12 deficiency.  I think the main issue right now is that she is totally  asymptomatic. Her platelet count has been holding relatively stable.  Her blood smear does not show anything suspicious.  I think I want to get her back in about 2 or 3 months.  We will see what her platelet count is at that point.  I do not see an indication for a bone marrow biopsy right now.  I spent a good hour and a half with Ms. Darleene Cleaver.  I reviewed her lab work with her.  I gave her my recommendations.  She understands what I am telling her.  She agrees with my recommendations.    ______________________________ Josph Macho, M.D. PRE/MEDQ  D:  10/08/2012  T:  10/21/2012  Job:  [id number]

## 2012-11-18 DIAGNOSIS — R29898 Other symptoms and signs involving the musculoskeletal system: Secondary | ICD-10-CM | POA: Insufficient documentation

## 2012-11-18 DIAGNOSIS — M5412 Radiculopathy, cervical region: Secondary | ICD-10-CM | POA: Insufficient documentation

## 2012-11-24 ENCOUNTER — Encounter: Payer: Self-pay | Admitting: Internal Medicine

## 2012-11-24 ENCOUNTER — Ambulatory Visit (HOSPITAL_BASED_OUTPATIENT_CLINIC_OR_DEPARTMENT_OTHER)
Admission: RE | Admit: 2012-11-24 | Discharge: 2012-11-24 | Disposition: A | Discharge status: Home / Self Care | Payer: Medicare Other | Source: Ambulatory Visit | Attending: Internal Medicine | Admitting: Internal Medicine

## 2012-11-24 ENCOUNTER — Ambulatory Visit (INDEPENDENT_AMBULATORY_CARE_PROVIDER_SITE_OTHER): Payer: Medicare Other | Admitting: Internal Medicine

## 2012-11-24 VITALS — BP 136/82 | HR 83 | Temp 98.1°F | Resp 18 | Wt 170.0 lb

## 2012-11-24 DIAGNOSIS — Z1231 Encounter for screening mammogram for malignant neoplasm of breast: Secondary | ICD-10-CM | POA: Insufficient documentation

## 2012-11-24 DIAGNOSIS — E119 Type 2 diabetes mellitus without complications: Secondary | ICD-10-CM

## 2012-11-24 DIAGNOSIS — I10 Essential (primary) hypertension: Secondary | ICD-10-CM

## 2012-11-24 DIAGNOSIS — D696 Thrombocytopenia, unspecified: Secondary | ICD-10-CM

## 2012-11-24 LAB — CBC WITH DIFFERENTIAL/PLATELET
Basophils Absolute: 0 10*3/uL (ref 0.0–0.1)
Basophils Relative: 0 % (ref 0–1)
Eosinophils Absolute: 0.1 10*3/uL (ref 0.0–0.7)
Eosinophils Relative: 3 % (ref 0–5)
HCT: 39 % (ref 36.0–46.0)
Hemoglobin: 14 g/dL (ref 12.0–15.0)
Lymphocytes Relative: 18 % (ref 12–46)
Lymphs Abs: 1 10*3/uL (ref 0.7–4.0)
MCH: 34.8 pg — ABNORMAL HIGH (ref 26.0–34.0)
MCHC: 35.9 g/dL (ref 30.0–36.0)
MCV: 97 fL (ref 78.0–100.0)
Monocytes Absolute: 0.4 10*3/uL (ref 0.1–1.0)
Monocytes Relative: 7 % (ref 3–12)
Neutro Abs: 4 10*3/uL (ref 1.7–7.7)
Neutrophils Relative %: 72 % (ref 43–77)
Platelets: 74 10*3/uL — ABNORMAL LOW (ref 150–400)
RBC: 4.02 MIL/uL (ref 3.87–5.11)
RDW: 14 % (ref 11.5–15.5)
WBC: 5.6 10*3/uL (ref 4.0–10.5)

## 2012-11-24 LAB — BASIC METABOLIC PANEL
BUN: 16 mg/dL (ref 6–23)
CO2: 28 mEq/L (ref 19–32)
Calcium: 9.7 mg/dL (ref 8.4–10.5)
Chloride: 103 mEq/L (ref 96–112)
Creat: 0.65 mg/dL (ref 0.50–1.10)
Glucose, Bld: 118 mg/dL — ABNORMAL HIGH (ref 70–99)
Potassium: 3.8 mEq/L (ref 3.5–5.3)
Sodium: 139 mEq/L (ref 135–145)

## 2012-11-24 NOTE — Progress Notes (Signed)
Subjective:    Patient ID: Jessica Dennis, female    DOB: November 29, 1932, 79 y.o.   MRN: [id number]  HPI Jessica Dennis is here for follow up  Her FBS glucoses range 140-170 at home  She admits to dietary discretion.      She is being followed for her thrombocytopenia  And has a follow up with Dr. Myna Hidalgo.    No numbness or paresthesias in hands or feet .    No Known Allergies Past Medical History  Diagnosis Date  . Anemia   . Anxiety   . Arthritis   . Hypertension   . Thyroid disease   . Borderline diabetes   . Diabetes mellitus without complication   . Cancer of the skin, basal cell 09/03/2012   Past Surgical History  Procedure Laterality Date  . Abdominal hysterectomy    . Joint replacement  97 and 06    Lt and RT hip  . Appendectomy    . Tonsillectomy     History   Social History  . Marital Status: Divorced    Spouse Name: N/A    Number of Children: N/A  . Years of Education: N/A   Occupational History  . Not on file.   Social History Main Topics  . Smoking status: Never Smoker   . Smokeless tobacco: Never Used  . Alcohol Use: Yes     Comment: glass of wine daily  . Drug Use: No  . Sexual Activity: No   Other Topics Concern  . Not on file   Social History Narrative  . No narrative on file   Family History  Problem Relation Age of Onset  . Cancer Father     bladder  . Heart attack Father    Patient Active Problem List   Diagnosis Date Noted  . Cancer of the skin, basal cell 09/03/2012  . Squamous cell skin cancer 09/03/2012  . Thrombocytopenia, unspecified 08/14/2012  . Lymphadenopathy of right cervical region 04/01/2012  . Diabetes mellitus, new onset 03/13/2012  . Hyperglycemia 09/19/2011  . History of anemia 06/28/2011  . Anxiety 06/28/2011  . Essential hypertension, benign 06/28/2011  . GERD (gastroesophageal reflux disease) 06/28/2011  . Other and unspecified hyperlipidemia 06/28/2011  . History of hysterectomy 06/28/2011  . Menopause  06/28/2011  . Hypothyroidism 06/28/2011  . Glaucoma 06/28/2011  . DJD (degenerative joint disease) 06/28/2011   Current Outpatient Prescriptions on File Prior to Visit  Medication Sig Dispense Refill  . Ascorbic Acid (VITAMIN C) 1000 MG tablet Take 1,000 mg by mouth daily.      . calcium carbonate (OS-CAL) 1250 MG chewable tablet Chew 1 tablet by mouth daily.      . Calcium Carbonate-Vitamin D (CALCIUM + D PO) Take by mouth 2 (two) times daily.      . CELEBREX 200 MG capsule Take 200 mg by mouth daily.       . furosemide (LASIX) 20 MG tablet Take 1 tablet (20 mg total) by mouth daily.  30 tablet  0  . glucose monitoring kit (FREESTYLE) monitoring kit 1 each by Does not apply route as needed for other.  1 each  0  . Iron-Vit C-Vit B12-Folic Acid (IRON 100 PLUS PO) Take by mouth at bedtime.      Marland Kitchen latanoprost (XALATAN) 0.005 % ophthalmic solution Place 1 drop into both eyes at bedtime.       Marland Kitchen levothyroxine (SYNTHROID, LEVOTHROID) 75 MCG tablet TAKE 1 TABLET (75 MCG TOTAL) BY MOUTH DAILY.  90 tablet  3  . LORazepam (ATIVAN) 0.5 MG tablet TAKE 1 TABLET(S) BY MOUTH TWICE DAILY AS NEEDED  30 tablet  0  . metFORMIN (GLUCOPHAGE-XR) 500 MG 24 hr tablet TAKE 1 TABLET BY MOUTH TWICE DAILY  180 tablet  1  . Omega-3 Fatty Acids (FISH OIL) 1000 MG CAPS Take by mouth every morning.      . pantoprazole (PROTONIX) 40 MG tablet Take 1 tablet (40 mg total) by mouth daily.  90 tablet  3  . potassium chloride (K-DUR) 10 MEQ tablet Take 1 tablet (10 mEq total) by mouth daily.  90 tablet  1  . Prednicarbate 0.1 % CREA Apply topically 2 (two) times daily.       . quinapril (ACCUPRIL) 40 MG tablet        No current facility-administered medications on file prior to visit.       Review of Systems    see HPI Objective:   Physical Exam Physical Exam  Nursing note and vitals reviewed.  Constitutional: She is oriented to person, place, and time. She appears well-developed and well-nourished.  HENT:  Head:  Normocephalic and atraumatic.  Cardiovascular: Normal rate and regular rhythm. Exam reveals no gallop and no friction rub.  No murmur heard.  Pulmonary/Chest: Breath sounds normal. She has no wheezes. She has no rales.  Neurological: She is alert and oriented to person, place, and time.  Skin: Skin is warm and dry. Foot exam:  No rashes or ulcerations Good bilateral pulses Normal microfilament bilaterally  Psychiatric: She has a normal mood and affect. Her behavior is normal.              Assessment & Plan:  DM  UTD with eye exam,  Normal foot exam ,  On ACE for renoprotection Will check AIC today.  She does not wish a statin for her slightly elevated LDL.  She will need a urine for microalbumin on her next visit.    thrombocytopenia  Will check today.  I do think her daily ETOH consumption may be contributing to this as well  Hypothryroidism  Adequate replacement    See me in 3-4 months

## 2012-11-24 NOTE — Patient Instructions (Signed)
To have labs today   See me in 3-4 months    Will order mammogram at this facility

## 2012-11-25 ENCOUNTER — Encounter: Payer: Self-pay | Admitting: *Deleted

## 2012-11-25 LAB — HEMOGLOBIN A1C
Hgb A1c MFr Bld: 5.3 % (ref <5.7)
Mean Plasma Glucose: 105 mg/dL (ref <117)

## 2012-11-27 ENCOUNTER — Other Ambulatory Visit: Payer: Self-pay | Admitting: Internal Medicine

## 2012-11-27 DIAGNOSIS — R928 Other abnormal and inconclusive findings on diagnostic imaging of breast: Secondary | ICD-10-CM

## 2012-12-12 ENCOUNTER — Telehealth: Payer: Self-pay | Admitting: Hematology & Oncology

## 2012-12-12 ENCOUNTER — Other Ambulatory Visit (HOSPITAL_BASED_OUTPATIENT_CLINIC_OR_DEPARTMENT_OTHER): Payer: Medicare Other | Admitting: Lab

## 2012-12-12 ENCOUNTER — Ambulatory Visit (HOSPITAL_BASED_OUTPATIENT_CLINIC_OR_DEPARTMENT_OTHER): Payer: Medicare Other | Admitting: Hematology & Oncology

## 2012-12-12 VITALS — BP 165/66 | HR 80 | Temp 98.0°F | Resp 14 | Ht 67.0 in | Wt 169.0 lb

## 2012-12-12 DIAGNOSIS — D696 Thrombocytopenia, unspecified: Secondary | ICD-10-CM

## 2012-12-12 LAB — CBC WITH DIFFERENTIAL (CANCER CENTER ONLY)
BASO#: 0 10*3/uL (ref 0.0–0.2)
BASO%: 0.4 % (ref 0.0–2.0)
EOS%: 3.9 % (ref 0.0–7.0)
Eosinophils Absolute: 0.2 10*3/uL (ref 0.0–0.5)
HCT: 38.8 % (ref 34.8–46.6)
HGB: 13.6 g/dL (ref 11.6–15.9)
LYMPH#: 1 10*3/uL (ref 0.9–3.3)
LYMPH%: 18.8 % (ref 14.0–48.0)
MCH: 34.1 pg — ABNORMAL HIGH (ref 26.0–34.0)
MCHC: 35.1 g/dL (ref 32.0–36.0)
MCV: 97 fL (ref 81–101)
MONO#: 0.4 10*3/uL (ref 0.1–0.9)
MONO%: 7.8 % (ref 0.0–13.0)
NEUT#: 3.5 10*3/uL (ref 1.5–6.5)
NEUT%: 69.1 % (ref 39.6–80.0)
Platelets: 60 10*3/uL — ABNORMAL LOW (ref 145–400)
RBC: 3.99 10*6/uL (ref 3.70–5.32)
RDW: 13.9 % (ref 11.1–15.7)
WBC: 5.1 10*3/uL (ref 3.9–10.0)

## 2012-12-12 LAB — CHCC SATELLITE - SMEAR

## 2012-12-12 NOTE — Telephone Encounter (Signed)
Pt wanted to come back in february not 1-21. I scheduled her

## 2012-12-12 NOTE — Progress Notes (Signed)
This office note has been dictated.

## 2012-12-12 NOTE — Telephone Encounter (Signed)
Pt aware of 2-4 [place] to be NPO 6 hrs. Transferred to RN for questions.

## 2012-12-13 NOTE — Progress Notes (Signed)
CC:   Kendrick Ranch, M.D.  DIAGNOSIS:  Thrombocytopenia.  CURRENT THERAPY:  Observation.  INTERIM HISTORY:  Jessica Dennis comes in for followup.  She is doing pretty well.  We first saw her back in September.  At that point in time, all of her lab work looked pretty much normal.  Her platelet count was on the low side.  A blood smear did not look suspicious for any obvious bone marrow abnormality.  One has to think that she may have a myelodysplasia.  She also may have a mild chronic immune thrombocytopenia.  She is on quite a few medications, so this also may be medication induced.  She feels well.  She has had no problems since we last saw her.  She has had no nausea or vomiting.  There has been no bleeding or bruising.  She has had no change in bowel or bladder habits.  She has had no fever, sweats, or chills.  PHYSICAL EXAMINATION:  General:  This is a well-developed, well- nourished white female, in no obvious distress.  Vital Signs: Temperature of 98, pulse 80, respiratory rate 14, blood pressure 165/66. Weight is 169.  Head and Neck:  Normocephalic, atraumatic skull.  She has no ocular or oral lesions.  There are no palpable cervical or supraclavicular lymph nodes.  Lungs:  Clear bilaterally.  Cardiac: Regular rate and rhythm with a normal S1, S2.  There are no murmurs, rubs, or bruits.  Abdomen:  Soft.  She has good bowel sounds.  There is no fluid wave.  There is no palpable hepatomegaly.  Spleen tip is palpable with inspiration.  Extremities:  No clubbing, cyanosis, or edema.  Neurological:  Exam shows no focal neurological deficit.  LABORATORY STUDIES AND DIAGNOSTIC DATA:  White cell count is 5.1, hemoglobin 13.6, hematocrit 38.8, platelet count 60,000.  MCV is 97.  She had a mammogram in November.  Mammogram looked suspicious for a possible mass in the right breast.  This I am sure will be followed up.  On her peripheral blood smear, she has normochromic,  normocytic population of red blood cells.  She has no nucleated red blood cells. There is no teardrop cells.  I see no nucleated red blood cells.  She has no schistocytes or spherocytes.  White cells appear normal in morphology and maturation.  She has no immature myeloid or lymphoid forms.  Platelets are decreased in number.  She has a couple of large platelets.  Platelets are well granulated.  IMPRESSION:  Jessica Dennis is a very charming 79 year old white female with thrombocytopenia.  She does have some splenomegaly by exam.  As such, one has to wonder if there is not some underlying splenic sequestration causing the problem.  She may have some cirrhosis.  I really think that we are going to have to do an ultrasound of her abdomen to see what is going on with her liver and spleen.  I want to see her back in 2 months.  I do want to get an ultrasound the day I see her, so we can evaluate her abdomen, particularly her liver and spleen.  I spent a good half hour with her.  I reviewed her lab work with her.  I went over my recommendations with her.    ______________________________ Josph Macho, M.D. PRE/MEDQ  D:  12/12/2012  T:  12/13/2012  Job:  [id number]

## 2012-12-16 ENCOUNTER — Other Ambulatory Visit: Payer: Self-pay | Admitting: *Deleted

## 2012-12-17 ENCOUNTER — Other Ambulatory Visit: Payer: Self-pay | Admitting: *Deleted

## 2012-12-17 ENCOUNTER — Telehealth: Payer: Self-pay | Admitting: *Deleted

## 2012-12-17 MED ORDER — LORAZEPAM 0.5 MG PO TABS: 0.5000 mg | ORAL_TABLET | Freq: Two times a day (BID) | ORAL | Status: DC

## 2012-12-17 NOTE — Telephone Encounter (Signed)
Refill request

## 2012-12-17 NOTE — Telephone Encounter (Signed)
Ativan called in to pharmacy per Dr Constance Goltz VO.

## 2012-12-17 NOTE — Telephone Encounter (Signed)
Notified pt that RX for ativan called in

## 2012-12-22 ENCOUNTER — Ambulatory Visit
Admission: RE | Admit: 2012-12-22 | Discharge: 2012-12-22 | Disposition: A | Discharge status: Home / Self Care | Payer: Medicare Other | Source: Ambulatory Visit | Attending: Internal Medicine | Admitting: Internal Medicine

## 2012-12-22 DIAGNOSIS — R928 Other abnormal and inconclusive findings on diagnostic imaging of breast: Secondary | ICD-10-CM

## 2012-12-29 NOTE — Progress Notes (Signed)
CC:   Kendrick Ranch, M.D.  DIAGNOSIS:  Thrombocytopenia.  HISTORY OF PRESENT ILLNESS:  [name] is a very charming 79 year old [place] female.  She is not related to Bettey Costa of the [employer] on [employer].  She has been in the [place] since 1957.  She is from [place].  She is followed by Dr. Constance Goltz.  Dr. Constance Goltz has noted that she has had thrombocytopenia.  This was just done on routine lab work.  Ms. Chaviano never had any problems with bleeding or bruising.  She has not noted any obvious changes in her medications.  She has had no weight loss or weight gain.  She is not a vegetarian.  Going through some of her lab work, going back to August of 2013, her platelet count was 89,000.  White cell count 5.6 with a hemoglobin of 12.6.  Back in February of 2014, platelet count was 61,000.  She had a white cell count of 6.3 and hemoglobin of 14.1.  Most recently, in July, a CBC was done which showed a white cell count of 6, hemoglobin 14.6, hematocrit 31.8, platelet count 84,000.  She had a normal white cell differential.  Her MCV was 93.  Ms. Duerr has had no problems with unusual joint aches or pains.  She has had no cough.  She has had no nausea or vomiting.  There has been no change in bowel or bladder habits.  She has been having routine mammograms.  She is diabetic.  She is not on insulin.  She says her last hemoglobin A1c was I think 5.3.  She has had electrolytes that were all done.  These all looked fine. Her TSH was okay when checked recently.  Again, she is kindly referred to the [facility] for an evaluation.  She does enjoy her wine and beer.  Being from [place], this is how she was raised.  PAST MEDICAL HISTORY:  Remarkable for: 1. Non-insulin-dependent diabetes. 2. Hypothyroidism. 3. Hyperlipidemia. 4. Hypothyroidism. 5. Status post hysterectomy. 6. Anxiety.  ALLERGIES:  None.  MEDICATIONS:  Lasix 20 mg p.o. daily,  Synthroid 0.075 mg p.o. daily, Ativan 0.5 mg p.o. b.i.d. p.r.n., Glucophage 500 mg p.o. daily, Protonix 40 mg p.o. daily, Accupril 40 mg p.o. at bedtime, and Travatan eyedrops 0.004% at bedtime.  SOCIAL HISTORY:  Remarkable for alcohol use.  She does enjoy wine daily. She will have 2-3 glass of wine a day.  There is also occasional beer. She has no tobacco use.  There are no obvious occupational exposures.  She used to work for [employer].  FAMILY HISTORY:  Negative for any kind of blood issues.  There is no cancer in the family.  There is diabetes and high blood pressure.  REVIEW OF SYSTEMS:  There are no additional findings other than that stated in the history of present illness.  She may have some easy bruising.  Again, there is no bleeding.  No change in bowel or bladder habits.  There is no headache.  There is no hair loss.  There are no sweats or chills.  She has had no weight loss or weight gain.  PHYSICAL EXAMINATION:  General:  This is an elderly, but well-nourished white female, in no obvious distress.  Vital Signs:  Temperature of 98, pulse 79, respiratory rate 14, blood pressure 152/63, weight is 173 pounds.  Head and Neck:  Normocephalic, atraumatic skull.  There are no ocular or oral lesions.  There are no palpable cervical  or supraclavicular lymph nodes.  Lungs:  Clear to percussion and auscultation bilaterally.  Cardiac:  Regular rate and rhythm with a normal S1, S2.  There are no murmurs, rubs or bruits.  Abdomen:  Soft. She has good bowel sounds.  There is no palpable abdominal mass.  She has got a laparotomy scar.  There is no fluid wave.  There is no palpable hepatosplenomegaly.  Axillary:  No bilateral axillary adenopathy.  Back:  No kyphosis.  There is no tenderness over the spine, ribs, or hips.  Extremities:  No clubbing, cyanosis, or edema.  There are some osteoarthritic changes in her joints.  She has good strength in her arms and legs.  Skin:   No rashes, ecchymosis, or petechia.  I see no suspicious hyperpigmented lesions.  Neurological:  No focal neurological deficits.  LABORATORY STUDIES:  White cell count is 4.5, hemoglobin 13.2, hematocrit 38.6, platelet count 66,000.  MCV is 99.  Peripheral smear, which I reviewed, shows a normochromic normocytic population of red blood cells.  There are no nucleated red blood cells. I see no teardrop cells.  There are no target cells.  I see no inclusion bodies.  She has no rouleaux formation.  There are no schistocytes or spherocytes.  White cells appear normal in morphology and maturation.  I see no hypersegmented polys.  There are no immature myeloid or lymphoid forms.  There are no atypical lymphocytes.  She has no blasts. Platelets are decreased in number.  She has mostly large platelets that are well granulated.  IMPRESSION:  Ms. Linnemann is a very charming 79 year old Caucasian female. She is from [place] originally.  She has mild thrombocytopenia.  This has been present for a year or so.  One has to suspect that this is going to be some type of immune based thrombocytopenia.  The platelets are large under the microscope on the blood smear.  Typically, this supports a destructive or peripheral consumptive process.  I cannot feel her liver or spleen, so I would not think an underlying bone marrow disorder would be possible.  One possibility that I thought about was the potential for functional hypersplenism.  She does have fairly significant alcohol use.  I just wonder if she may not have some mild cirrhosis, and along with that some functional hypersplenism that could be consuming the platelets.  I do not see any medicine that she is on that really would be implicated.  I do not see anything that would suggest a collagen vascular disease.  I do not see anything that would suggest B12 deficiency.  I think the main issue right now is that she is totally asymptomatic. Her  platelet count has been holding relatively stable.  Her blood smear does not show anything suspicious.  I think I want to get her back in about 2 to 3 months.  We will see what her platelet count is at that point.  I do not see an indication for a bone marrow biopsy right now.  I spent a good hour and half with Ms. Darleene Cleaver.  I reviewed her lab work with her.  I gave her my recommendations.  She understands what I am telling her.  She agrees with my recommendations.    ______________________________ Josph Macho, M.D. PRE/MEDQ  D:  10/08/2012  T:  12/28/2012  Job:  [id number]

## 2013-01-20 ENCOUNTER — Encounter: Payer: Self-pay | Admitting: Internal Medicine

## 2013-01-20 ENCOUNTER — Ambulatory Visit (INDEPENDENT_AMBULATORY_CARE_PROVIDER_SITE_OTHER): Payer: Medicare Other | Admitting: Internal Medicine

## 2013-01-20 VITALS — BP 157/87 | HR 88 | Temp 98.0°F | Resp 18 | Wt 169.0 lb

## 2013-01-20 DIAGNOSIS — N952 Postmenopausal atrophic vaginitis: Secondary | ICD-10-CM | POA: Insufficient documentation

## 2013-01-20 DIAGNOSIS — N951 Menopausal and female climacteric states: Secondary | ICD-10-CM

## 2013-01-20 DIAGNOSIS — Z90710 Acquired absence of both cervix and uterus: Secondary | ICD-10-CM | POA: Insufficient documentation

## 2013-01-20 NOTE — Progress Notes (Signed)
Subjective:    Patient ID: Jessica Dennis, female    DOB: 1932/08/12, 79 y.o.   MRN: [id number]  HPI [name] is here for acute visit.  She is tearful during interview.  She is in a new relationship and her partner is ready to have intercourse and she knows she has lots of vaginal dryness and it will be painful She is leaving with  Her boyfriend on a trip to [place] on Monday.  She is S/P hysterectomy  No Known Allergies Past Medical History  Diagnosis Date  . Anemia   . Anxiety   . Arthritis   . Hypertension   . Thyroid disease   . Borderline diabetes   . Diabetes mellitus without complication   . Cancer of the skin, basal cell 09/03/2012   Past Surgical History  Procedure Laterality Date  . Abdominal hysterectomy    . Joint replacement  97 and 06    Lt and RT hip  . Appendectomy    . Tonsillectomy     History   Social History  . Marital Status: Divorced    Spouse Name: N/A    Number of Children: N/A  . Years of Education: N/A   Occupational History  . Not on file.   Social History Main Topics  . Smoking status: Never Smoker   . Smokeless tobacco: Never Used  . Alcohol Use: Yes     Comment: glass of wine daily  . Drug Use: No  . Sexual Activity: No   Other Topics Concern  . Not on file   Social History Narrative  . No narrative on file   Family History  Problem Relation Age of Onset  . Cancer Father     bladder  . Heart attack Father    Patient Active Problem List   Diagnosis Date Noted  . Cancer of the skin, basal cell 09/03/2012  . Squamous cell skin cancer 09/03/2012  . Thrombocytopenia, unspecified 08/14/2012  . Lymphadenopathy of right cervical region 04/01/2012  . Diabetes mellitus, new onset 03/13/2012  . Hyperglycemia 09/19/2011  . History of anemia 06/28/2011  . Anxiety 06/28/2011  . Essential hypertension, benign 06/28/2011  . GERD (gastroesophageal reflux disease) 06/28/2011  . Other and unspecified hyperlipidemia 06/28/2011  .  History of hysterectomy 06/28/2011  . Menopause 06/28/2011  . Hypothyroidism 06/28/2011  . Glaucoma 06/28/2011  . DJD (degenerative joint disease) 06/28/2011   Current Outpatient Prescriptions on File Prior to Visit  Medication Sig Dispense Refill  . ACCU-CHEK AVIVA PLUS test strip 1 each by Other route as needed.       . Ascorbic Acid (VITAMIN C) 1000 MG tablet Take 1,000 mg by mouth daily.      . Calcium Carbonate-Vitamin D (CALCIUM + D PO) Take by mouth 2 (two) times daily.      . CELEBREX 200 MG capsule Take 200 mg by mouth daily.       . furosemide (LASIX) 20 MG tablet Take 1 tablet (20 mg total) by mouth daily.  30 tablet  0  . Iron-Vit C-Vit B12-Folic Acid (IRON 100 PLUS PO) Take by mouth at bedtime.      Marland Kitchen latanoprost (XALATAN) 0.005 % ophthalmic solution Place 1 drop into both eyes at bedtime.       Marland Kitchen levothyroxine (SYNTHROID, LEVOTHROID) 75 MCG tablet TAKE 1 TABLET (75 MCG TOTAL) BY MOUTH DAILY.  90 tablet  3  . LORazepam (ATIVAN) 0.5 MG tablet Take 1 tablet (0.5 mg total) by mouth 2 (  two) times daily.  60 tablet  0  . meloxicam (MOBIC) 7.5 MG tablet Take 7.5 mg by mouth daily.       . metFORMIN (GLUCOPHAGE-XR) 500 MG 24 hr tablet TAKE 1 TABLET BY MOUTH TWICE DAILY  180 tablet  1  . Omega-3 Fatty Acids (FISH OIL) 1000 MG CAPS Take by mouth every morning.      . pantoprazole (PROTONIX) 40 MG tablet Take 1 tablet (40 mg total) by mouth daily.  90 tablet  3  . potassium chloride (K-DUR) 10 MEQ tablet Take 1 tablet (10 mEq total) by mouth daily.  90 tablet  1  . Prednicarbate 0.1 % CREA Apply topically 2 (two) times daily.       . quinapril (ACCUPRIL) 40 MG tablet Take 40 mg by mouth daily.        No current facility-administered medications on file prior to visit.       Review of Systems See HPI    Objective:   Physical Exam  Physical Exam  Nursing note and vitals reviewed.  Constitutional: She is oriented to person, place, and time. She appears well-developed and  well-nourished.  HENT:  Head: Normocephalic and atraumatic.  Cardiovascular: Normal rate and regular rhythm. Exam reveals no gallop and no friction rub.  No murmur heard.  Pulmonary/Chest: Breath sounds normal. She has no wheezes. She has no rales.  She declines pelvic exam today Neurological: She is alert and oriented to person, place, and time.  Skin: Skin is warm and dry.  Psychiatric: She has a normal mood and affect. Her behavior is normal.            Assessment & Plan:  Vaginal dryness  She tells me she is in the donut hole for medicare and cannot afford prescription meds now.  Discussed vaginal estradiol when she can afford  Gave Luvena samples and she can use Luvena OTC  Call me in January  When she can fill medicine

## 2013-01-20 NOTE — Patient Instructions (Signed)
Use Luvena vaginally every night for 2 weeks and then cut back to twice a week and with intercourse  Get Phazyme over the counter and use as directed  Avoid beans and cabbage in diet    Call me in January for vagifem

## 2013-02-04 ENCOUNTER — Other Ambulatory Visit: Payer: Self-pay | Admitting: *Deleted

## 2013-02-05 ENCOUNTER — Other Ambulatory Visit: Payer: Self-pay | Admitting: *Deleted

## 2013-02-05 ENCOUNTER — Telehealth: Payer: Self-pay | Admitting: *Deleted

## 2013-02-05 MED ORDER — ESTRADIOL 10 MCG VA TABS: ORAL_TABLET | VAGINAL | Status: DC

## 2013-02-05 NOTE — Telephone Encounter (Signed)
Notified pt that RX for vagifem has been sent to [facility].

## 2013-02-05 NOTE — Telephone Encounter (Signed)
Pt is requesting this RX.

## 2013-02-06 ENCOUNTER — Telehealth: Payer: Self-pay | Admitting: *Deleted

## 2013-02-06 ENCOUNTER — Other Ambulatory Visit: Payer: Self-pay | Admitting: *Deleted

## 2013-02-06 MED ORDER — ESTRADIOL 0.1 MG/GM VA CREA: TOPICAL_CREAM | VAGINAL | Status: DC

## 2013-02-06 NOTE — Telephone Encounter (Signed)
Pt requested an alternative to Estradiol due to cost, estrace cream called per Dr Coralyn Mark VO  in but was more expensive will notify Dr Coralyn Mark for alternatives

## 2013-02-10 NOTE — Telephone Encounter (Signed)
New RX for HT called in to pharmacy per Dr. Coralyn Mark VO

## 2013-02-17 ENCOUNTER — Other Ambulatory Visit: Payer: Self-pay | Admitting: *Deleted

## 2013-02-17 DIAGNOSIS — F411 Generalized anxiety disorder: Secondary | ICD-10-CM

## 2013-02-17 MED ORDER — LORAZEPAM 0.5 MG PO TABS: 0.5000 mg | ORAL_TABLET | Freq: Two times a day (BID) | ORAL | Status: DC

## 2013-02-17 NOTE — Telephone Encounter (Signed)
Received fax for refill- discussed with Dr. Coralyn Mark and refill phoned in.

## 2013-02-18 ENCOUNTER — Telehealth: Payer: Self-pay | Admitting: *Deleted

## 2013-02-18 NOTE — Telephone Encounter (Signed)
Jessica Dennis wants a written Rx for   Estradiol 10 MCG TABS vaginal tablet    She wants to shop around pharmacies and find the cheapest place to get it.  She said she would like to come by Friday and pick it up.

## 2013-02-19 ENCOUNTER — Other Ambulatory Visit: Payer: Self-pay | Admitting: *Deleted

## 2013-02-19 MED ORDER — ESTRADIOL 10 MCG VA TABS: ORAL_TABLET | VAGINAL | Status: DC

## 2013-02-19 NOTE — Telephone Encounter (Signed)
Pt would like a written RX

## 2013-02-23 ENCOUNTER — Telehealth: Payer: Self-pay | Admitting: Hematology & Oncology

## 2013-02-23 NOTE — Telephone Encounter (Signed)
Called pt to change time of 2-4 she said she was going to call me today to cx because she is going out of town. She said she would call back to reschedule in march. I left RN voice mail

## 2013-02-25 ENCOUNTER — Ambulatory Visit (HOSPITAL_BASED_OUTPATIENT_CLINIC_OR_DEPARTMENT_OTHER): Payer: Medicare Other

## 2013-02-25 ENCOUNTER — Other Ambulatory Visit: Payer: Medicare Other | Admitting: Lab

## 2013-02-25 ENCOUNTER — Other Ambulatory Visit (HOSPITAL_BASED_OUTPATIENT_CLINIC_OR_DEPARTMENT_OTHER): Payer: Medicare Other

## 2013-02-25 ENCOUNTER — Ambulatory Visit: Payer: Medicare Other | Admitting: Hematology & Oncology

## 2013-03-11 ENCOUNTER — Other Ambulatory Visit: Payer: Self-pay | Admitting: *Deleted

## 2013-03-11 DIAGNOSIS — F411 Generalized anxiety disorder: Secondary | ICD-10-CM

## 2013-03-11 NOTE — Telephone Encounter (Signed)
Refill request will call in pending approval 

## 2013-03-12 MED ORDER — LORAZEPAM 0.5 MG PO TABS: ORAL_TABLET | ORAL | Status: DC

## 2013-03-12 NOTE — Telephone Encounter (Signed)
Ativan called in to [facility].

## 2013-03-23 ENCOUNTER — Other Ambulatory Visit: Payer: Self-pay | Admitting: Internal Medicine

## 2013-03-23 ENCOUNTER — Other Ambulatory Visit: Payer: Self-pay | Admitting: *Deleted

## 2013-03-23 DIAGNOSIS — E785 Hyperlipidemia, unspecified: Secondary | ICD-10-CM

## 2013-03-23 DIAGNOSIS — Z90710 Acquired absence of both cervix and uterus: Secondary | ICD-10-CM

## 2013-03-23 DIAGNOSIS — I10 Essential (primary) hypertension: Secondary | ICD-10-CM

## 2013-03-23 DIAGNOSIS — E119 Type 2 diabetes mellitus without complications: Secondary | ICD-10-CM

## 2013-03-23 DIAGNOSIS — Z0000 Encounter for general adult medical examination without abnormal findings: Secondary | ICD-10-CM

## 2013-03-23 NOTE — Telephone Encounter (Signed)
duplicate

## 2013-03-24 ENCOUNTER — Encounter: Payer: Self-pay | Admitting: Internal Medicine

## 2013-03-24 ENCOUNTER — Ambulatory Visit (INDEPENDENT_AMBULATORY_CARE_PROVIDER_SITE_OTHER): Payer: Medicare Other | Admitting: Internal Medicine

## 2013-03-24 ENCOUNTER — Other Ambulatory Visit: Payer: Self-pay | Admitting: *Deleted

## 2013-03-24 VITALS — BP 152/79 | HR 86 | Temp 98.2°F | Resp 18 | Wt 170.0 lb

## 2013-03-24 DIAGNOSIS — Z0000 Encounter for general adult medical examination without abnormal findings: Secondary | ICD-10-CM

## 2013-03-24 DIAGNOSIS — M1990 Unspecified osteoarthritis, unspecified site: Secondary | ICD-10-CM

## 2013-03-24 DIAGNOSIS — Z794 Long term (current) use of insulin: Secondary | ICD-10-CM | POA: Insufficient documentation

## 2013-03-24 DIAGNOSIS — K219 Gastro-esophageal reflux disease without esophagitis: Secondary | ICD-10-CM

## 2013-03-24 DIAGNOSIS — E2839 Other primary ovarian failure: Secondary | ICD-10-CM

## 2013-03-24 DIAGNOSIS — E039 Hypothyroidism, unspecified: Secondary | ICD-10-CM

## 2013-03-24 DIAGNOSIS — I10 Essential (primary) hypertension: Secondary | ICD-10-CM

## 2013-03-24 DIAGNOSIS — N951 Menopausal and female climacteric states: Secondary | ICD-10-CM

## 2013-03-24 DIAGNOSIS — E1165 Type 2 diabetes mellitus with hyperglycemia: Secondary | ICD-10-CM | POA: Insufficient documentation

## 2013-03-24 DIAGNOSIS — C4490 Unspecified malignant neoplasm of skin, unspecified: Secondary | ICD-10-CM

## 2013-03-24 DIAGNOSIS — E1151 Type 2 diabetes mellitus with diabetic peripheral angiopathy without gangrene: Secondary | ICD-10-CM

## 2013-03-24 DIAGNOSIS — E119 Type 2 diabetes mellitus without complications: Secondary | ICD-10-CM

## 2013-03-24 LAB — CBC WITH DIFFERENTIAL/PLATELET
Basophils Absolute: 0.1 10*3/uL (ref 0.0–0.1)
Basophils Relative: 1 % (ref 0–1)
Eosinophils Absolute: 0.2 10*3/uL (ref 0.0–0.7)
Eosinophils Relative: 4 % (ref 0–5)
HCT: 38.1 % (ref 36.0–46.0)
Hemoglobin: 13.6 g/dL (ref 12.0–15.0)
Lymphocytes Relative: 19 % (ref 12–46)
Lymphs Abs: 1 10*3/uL (ref 0.7–4.0)
MCH: 34.4 pg — ABNORMAL HIGH (ref 26.0–34.0)
MCHC: 35.7 g/dL (ref 30.0–36.0)
MCV: 96.5 fL (ref 78.0–100.0)
Monocytes Absolute: 0.4 10*3/uL (ref 0.1–1.0)
Monocytes Relative: 8 % (ref 3–12)
Neutro Abs: 3.7 10*3/uL (ref 1.7–7.7)
Neutrophils Relative %: 68 % (ref 43–77)
Platelets: 80 10*3/uL — ABNORMAL LOW (ref 150–400)
RBC: 3.95 MIL/uL (ref 3.87–5.11)
RDW: 14.3 % (ref 11.5–15.5)
WBC: 5.5 10*3/uL (ref 4.0–10.5)

## 2013-03-24 LAB — COMPREHENSIVE METABOLIC PANEL
ALT: 28 U/L (ref 0–35)
AST: 39 U/L — ABNORMAL HIGH (ref 0–37)
Albumin: 4 g/dL (ref 3.5–5.2)
Alkaline Phosphatase: 111 U/L (ref 39–117)
BUN: 15 mg/dL (ref 6–23)
CO2: 27 mEq/L (ref 19–32)
Calcium: 9.4 mg/dL (ref 8.4–10.5)
Chloride: 101 mEq/L (ref 96–112)
Creat: 0.67 mg/dL (ref 0.50–1.10)
Glucose, Bld: 154 mg/dL — ABNORMAL HIGH (ref 70–99)
Potassium: 3.6 mEq/L (ref 3.5–5.3)
Sodium: 137 mEq/L (ref 135–145)
Total Bilirubin: 1.6 mg/dL — ABNORMAL HIGH (ref 0.2–1.2)
Total Protein: 6.4 g/dL (ref 6.0–8.3)

## 2013-03-24 LAB — POCT URINALYSIS DIPSTICK
Bilirubin, UA: NEGATIVE
Blood, UA: NEGATIVE
Glucose, UA: NEGATIVE
Ketones, UA: NEGATIVE
Leukocytes, UA: NEGATIVE
Nitrite, UA: NEGATIVE
Protein, UA: NEGATIVE
Spec Grav, UA: 1.01
Urobilinogen, UA: NEGATIVE
pH, UA: 5.5

## 2013-03-24 LAB — HEMOGLOBIN A1C
Hgb A1c MFr Bld: 5.4 % (ref <5.7)
Mean Plasma Glucose: 108 mg/dL (ref <117)

## 2013-03-24 LAB — LIPID PANEL
Cholesterol: 175 mg/dL (ref 0–200)
HDL: 60 mg/dL (ref >39)
LDL Cholesterol: 98 mg/dL (ref 0–99)
Total CHOL/HDL Ratio: 2.9 Ratio
Triglycerides: 87 mg/dL (ref <150)
VLDL: 17 mg/dL (ref 0–40)

## 2013-03-24 MED ORDER — METFORMIN HCL ER 500 MG PO TB24: ORAL_TABLET | ORAL | Status: DC

## 2013-03-24 MED ORDER — FUROSEMIDE 20 MG PO TABS: 20.0000 mg | ORAL_TABLET | Freq: Every day | ORAL | Status: DC

## 2013-03-24 MED ORDER — POTASSIUM CHLORIDE ER 10 MEQ PO TBCR: 10.0000 meq | EXTENDED_RELEASE_TABLET | Freq: Every day | ORAL | Status: DC

## 2013-03-24 NOTE — Patient Instructions (Addendum)
Call office if diarrhea does not resolve  OK to take OTC Immodium per bottle directions  Will refer to dermatology  Dr. Delman Cheadle or Dr. Renda Rolls  See mein 3-4 months

## 2013-03-24 NOTE — Progress Notes (Signed)
Subjective:    Patient ID: Jessica Dennis, female    DOB: [birth year], 80 y.o.   MRN: [id number]  HPI [name] is here for CPE.  Recently returned from [place]  She reports 2 friends on trip developed  Vomiting/diarrhea illness .  Exposure felt due to GI virus and not due to diet.  Pt began Sunday with diarrheal illness.  Mild abd craming relieved after BM.  NO blood or watery stool.  NO fever no vomiting slight nausea.     She does not want rectal exam today  Glucoses doing well at home  She has had squamous cell skin cancer in the past and needs a new dermatologist as former retired.  Diabetes  Has appt with opthalmologist next week.  No foot  Numbness or rash.symptoms.  On ACE  Will recheck lipids today  No Known Allergies Past Medical History  Diagnosis Date  . Anemia   . Anxiety   . Arthritis   . Hypertension   . Thyroid disease   . Borderline diabetes   . Diabetes mellitus without complication   . Cancer of the skin, basal cell 09/03/2012   Past Surgical History  Procedure Laterality Date  . Abdominal hysterectomy    . Joint replacement  97 and 06    Lt and RT hip  . Appendectomy    . Tonsillectomy     History   Social History  . Marital Status: Divorced    Spouse Name: N/A    Number of Children: N/A  . Years of Education: N/A   Occupational History  . Not on file.   Social History Main Topics  . Smoking status: Never Smoker   . Smokeless tobacco: Never Used  . Alcohol Use: Yes     Comment: glass of wine daily  . Drug Use: No  . Sexual Activity: No   Other Topics Concern  . Not on file   Social History Narrative  . No narrative on file   Family History  Problem Relation Age of Onset  . Cancer Father     bladder  . Heart attack Father    Patient Active Problem List   Diagnosis Date Noted  . Diabetes 03/24/2013  . Vaginal dryness, menopausal 01/20/2013  . Atrophic vaginitis 01/20/2013  . S/P hysterectomy 01/20/2013  . Cancer of the  skin, basal cell 09/03/2012  . Squamous cell skin cancer 09/03/2012  . Thrombocytopenia, unspecified 08/14/2012  . Lymphadenopathy of right cervical region 04/01/2012  . Hyperglycemia 09/19/2011  . History of anemia 06/28/2011  . Anxiety 06/28/2011  . Essential hypertension, benign 06/28/2011  . GERD (gastroesophageal reflux disease) 06/28/2011  . Other and unspecified hyperlipidemia 06/28/2011  . History of hysterectomy 06/28/2011  . Menopause 06/28/2011  . Hypothyroidism 06/28/2011  . Glaucoma 06/28/2011  . DJD (degenerative joint disease) 06/28/2011   Current Outpatient Prescriptions on File Prior to Visit  Medication Sig Dispense Refill  . ACCU-CHEK AVIVA PLUS test strip 1 each by Other route as needed.       . Ascorbic Acid (VITAMIN C) 1000 MG tablet Take 1,000 mg by mouth daily.      . Calcium Carbonate-Vitamin D (CALCIUM + D PO) Take by mouth 2 (two) times daily.      . furosemide (LASIX) 20 MG tablet Take 1 tablet (20 mg total) by mouth daily.  30 tablet  0  . Iron-Vit C-Vit B12-Folic Acid (IRON 123XX123 PLUS PO) Take by mouth at bedtime.      Marland Kitchen  latanoprost (XALATAN) 0.005 % ophthalmic solution Place 1 drop into both eyes at bedtime.       Marland Kitchen levothyroxine (SYNTHROID, LEVOTHROID) 75 MCG tablet TAKE 1 TABLET (75 MCG TOTAL) BY MOUTH DAILY.  90 tablet  3  . LORazepam (ATIVAN) 0.5 MG tablet Take one tablet once a day prn  30 tablet  1  . meloxicam (MOBIC) 7.5 MG tablet Take 7.5 mg by mouth daily.       . metFORMIN (GLUCOPHAGE-XR) 500 MG 24 hr tablet TAKE 1 TABLET BY MOUTH TWICE DAILY  180 tablet  1  . Omega-3 Fatty Acids (FISH OIL) 1000 MG CAPS Take by mouth every morning.      . pantoprazole (PROTONIX) 40 MG tablet Take 1 tablet (40 mg total) by mouth daily.  90 tablet  3  . potassium chloride (K-DUR) 10 MEQ tablet Take 1 tablet (10 mEq total) by mouth daily.  90 tablet  1  . Prednicarbate 0.1 % CREA Apply topically 2 (two) times daily.       . quinapril (ACCUPRIL) 40 MG tablet Take 40  mg by mouth daily.       . Estradiol (VAGIFEM) 10 MCG TABS vaginal tablet Insert one tablet vaginally every night for 2 weeks then decrease to one tablet twice a week  8 tablet  2   No current facility-administered medications on file prior to visit.       Review of Systems  Constitutional: Negative for fever and fatigue.  Respiratory: Negative for chest tightness and shortness of breath.   Cardiovascular: Negative for chest pain.  Gastrointestinal: Positive for nausea and diarrhea. Negative for vomiting and blood in stool.  All other systems reviewed and are negative.       Objective:   Physical Exam  Physical Exam  Nursing note and vitals reviewed.  Constitutional: She is oriented to person, place, and time. She appears well-developed and well-nourished.  HENT:  Head: Normocephalic and atraumatic.  Right Ear: Tympanic membrane and ear canal normal. No drainage. Tympanic membrane is not injected and not erythematous.  Left Ear: Tympanic membrane and ear canal normal. No drainage. Tympanic membrane is not injected and not erythematous.  Nose: Nose normal. Right sinus exhibits no maxillary sinus tenderness and no frontal sinus tenderness. Left sinus exhibits no maxillary sinus tenderness and no frontal sinus tenderness.  Mouth/Throat: Oropharynx is clear and moist. No oral lesions. No oropharyngeal exudate.  Eyes: Conjunctivae and EOM are normal. Pupils are equal, round, and reactive to light.  Neck: Normal range of motion. Neck supple. No JVD present. Carotid bruit is not present. No mass and no thyromegaly present.  Cardiovascular: Normal rate, regular rhythm, S1 normal, S2 normal and intact distal pulses. Exam reveals no gallop and no friction rub.  No murmur heard.  Pulses:  Carotid pulses are 2+ on the right side, and 2+ on the left side.  Dorsalis pedis pulses are 2+ on the right side, and 2+ on the left side.  No carotid bruit. No LE edema  Pulmonary/Chest: Breath sounds  normal. She has no wheezes. She has no rales. She exhibits no tenderness.   Breasts no discrete mass no nipple discharge no axillary adenoapathy bilaterally Abdominal: Soft. Bowel sounds are normal. She exhibits no distension and no mass. There is no hepatosplenomegaly. There is no tenderness. There is no CVA tenderness.   Pt declines rectal exam Musculoskeletal: Normal range of motion.  No active synovitis to joints.  Lymphadenopathy:  She has no cervical adenopathy.  She has no axillary adenopathy.  Right: No inguinal and no supraclavicular adenopathy present.  Left: No inguinal and no supraclavicular adenopathy present.  Neurological: She is alert and oriented to person, place, and time. She has normal strength and normal reflexes. She displays no tremor. No cranial nerve deficit or sensory deficit. Coordination and gait normal.  Skin: Skin is warm and dry. No rash noted. No cyanosis. Nails show no clubbing.  Foot exam  No rashes or ulceration.  Normal sensation to microfilament.  Good pedal lpulses Psychiatric: She has a normal mood and affect. Her speech is normal and behavior is normal. Cognition and memory are normal.         Assessment & Plan:  Health Maintenance  See scanned sheet   Will schedule Dexa.   Report had colonsocop 5-6 years ago [facility]   DM  Check AIC today  On Ace check lipids normal foot exam  optho app next week  HTN  cotinue meds  Diarrhea  Likely due to viral Gastroenteritis  Pt advised to call office for appt if does not resolve  hyperlipdemia  Delcines Rx medication for this  Squamous cells skin CA  Will refer to new dermatologist in [place]  Hypothyroidism normal TSH recent check  See me in 3 months

## 2013-03-25 ENCOUNTER — Encounter: Payer: Self-pay | Admitting: *Deleted

## 2013-03-25 LAB — MICROALBUMIN / CREATININE URINE RATIO
Creatinine, Urine: 18.2 mg/dL
Microalb Creat Ratio: 42.9 mg/g — ABNORMAL HIGH (ref 0.0–30.0)
Microalb, Ur: 0.78 mg/dL (ref 0.00–1.89)

## 2013-04-09 ENCOUNTER — Ambulatory Visit
Admission: RE | Admit: 2013-04-09 | Discharge: 2013-04-09 | Disposition: A | Discharge status: Home / Self Care | Payer: Medicare Other | Source: Ambulatory Visit | Attending: Internal Medicine | Admitting: Internal Medicine

## 2013-04-09 DIAGNOSIS — E2839 Other primary ovarian failure: Secondary | ICD-10-CM

## 2013-04-17 ENCOUNTER — Telehealth: Payer: Self-pay | Admitting: *Deleted

## 2013-04-17 NOTE — Telephone Encounter (Signed)
Notified pt of DEXA results and recommendation to take OTC Ca and vit D

## 2013-04-17 NOTE — Telephone Encounter (Signed)
Message copied by Conley Rolls on Fri Apr 17, 2013  8:48 AM ------      Message from: Emi Belfast D      Created: Wed Apr 15, 2013  1:34 PM       Jolayne Haines            Call pt and let her know that her bone density test is normal no osteoporosis.  Take OTC calcium and vitamin D ------

## 2013-04-27 ENCOUNTER — Encounter: Payer: Self-pay | Admitting: Internal Medicine

## 2013-04-27 ENCOUNTER — Other Ambulatory Visit: Payer: Self-pay | Admitting: Internal Medicine

## 2013-04-27 DIAGNOSIS — Z9289 Personal history of other medical treatment: Secondary | ICD-10-CM | POA: Insufficient documentation

## 2013-04-27 NOTE — Telephone Encounter (Signed)
Refill request

## 2013-05-18 ENCOUNTER — Other Ambulatory Visit: Payer: Self-pay | Admitting: *Deleted

## 2013-05-18 DIAGNOSIS — F411 Generalized anxiety disorder: Secondary | ICD-10-CM

## 2013-05-18 NOTE — Telephone Encounter (Signed)
Refill request will call in pending approval 

## 2013-05-19 MED ORDER — LORAZEPAM 0.5 MG PO TABS: ORAL_TABLET | ORAL | Status: DC

## 2013-05-19 NOTE — Telephone Encounter (Signed)
Called in Ativan to [facility] notified pt via VM message

## 2013-06-14 ENCOUNTER — Other Ambulatory Visit: Payer: Self-pay | Admitting: Internal Medicine

## 2013-06-16 NOTE — Telephone Encounter (Signed)
Refill request

## 2013-06-18 ENCOUNTER — Other Ambulatory Visit: Payer: Self-pay | Admitting: Internal Medicine

## 2013-06-24 ENCOUNTER — Other Ambulatory Visit: Payer: Self-pay | Admitting: *Deleted

## 2013-06-24 DIAGNOSIS — D696 Thrombocytopenia, unspecified: Secondary | ICD-10-CM

## 2013-06-24 MED ORDER — GLUCOSE BLOOD VI STRP: 1.0000 | ORAL_STRIP | Status: DC | PRN

## 2013-06-24 MED ORDER — ACCU-CHEK SOFT TOUCH LANCETS MISC: Status: DC

## 2013-07-07 ENCOUNTER — Encounter: Payer: Self-pay | Admitting: Internal Medicine

## 2013-07-07 ENCOUNTER — Ambulatory Visit (INDEPENDENT_AMBULATORY_CARE_PROVIDER_SITE_OTHER): Payer: Medicare Other | Admitting: Internal Medicine

## 2013-07-07 ENCOUNTER — Ambulatory Visit (HOSPITAL_BASED_OUTPATIENT_CLINIC_OR_DEPARTMENT_OTHER)
Admission: RE | Admit: 2013-07-07 | Discharge: 2013-07-07 | Disposition: A | Discharge status: Home / Self Care | Payer: Medicare Other | Source: Ambulatory Visit | Attending: Internal Medicine | Admitting: Internal Medicine

## 2013-07-07 VITALS — BP 117/69 | HR 80 | Resp 16 | Ht 67.0 in | Wt 171.0 lb

## 2013-07-07 DIAGNOSIS — R609 Edema, unspecified: Secondary | ICD-10-CM

## 2013-07-07 DIAGNOSIS — R600 Localized edema: Secondary | ICD-10-CM

## 2013-07-07 DIAGNOSIS — L03119 Cellulitis of unspecified part of limb: Secondary | ICD-10-CM

## 2013-07-07 DIAGNOSIS — L02419 Cutaneous abscess of limb, unspecified: Secondary | ICD-10-CM

## 2013-07-07 MED ORDER — AMOXICILLIN-POT CLAVULANATE 500-125 MG PO TABS: ORAL_TABLET | ORAL | Status: DC

## 2013-07-07 MED ORDER — CEFTRIAXONE SODIUM 1 G IJ SOLR
1.0000 g | Freq: Once | INTRAMUSCULAR | Status: AC
  Administered 2013-07-07: 1 g via INTRAMUSCULAR

## 2013-07-07 NOTE — Patient Instructions (Signed)
See me in 3 weeks  30 min  To pharmacy today

## 2013-07-07 NOTE — Progress Notes (Signed)
Subjective:    Patient ID: Jessica Dennis, female    DOB: 10/25/1932, 80 y.o.   MRN: [id number]  HPI  Jessica Dennis is here for acute visit.  She reports swelling of R foot and calf for the past 7-10 days.  No injury or trauma .  She does have several healing sore areas on R ant lower leg thae "she picks on"    She does report plane travel to [facility] for a graduation prior to foot and leg swelling   Diabetes  Last AIc great 03/2013   No Known Allergies Past Medical History  Diagnosis Date  . Anemia   . Anxiety   . Arthritis   . Hypertension   . Thyroid disease   . Borderline diabetes   . Diabetes mellitus without complication   . Cancer of the skin, basal cell 09/03/2012   Past Surgical History  Procedure Laterality Date  . Abdominal hysterectomy    . Joint replacement  97 and 06    Lt and RT hip  . Appendectomy    . Tonsillectomy     History   Social History  . Marital Status: Divorced    Spouse Name: N/A    Number of Children: N/A  . Years of Education: N/A   Occupational History  . Not on file.   Social History Main Topics  . Smoking status: Never Smoker   . Smokeless tobacco: Never Used  . Alcohol Use: Yes     Comment: glass of wine daily  . Drug Use: No  . Sexual Activity: No   Other Topics Concern  . Not on file   Social History Narrative  . No narrative on file   Family History  Problem Relation Age of Onset  . Cancer Father     bladder  . Heart attack Father    Patient Active Problem List   Diagnosis Date Noted  . H/O bone density study 04/27/2013  . Diabetes 03/24/2013  . Vaginal dryness, menopausal 01/20/2013  . Atrophic vaginitis 01/20/2013  . S/P hysterectomy 01/20/2013  . Cancer of the skin, basal cell 09/03/2012  . Squamous cell skin cancer 09/03/2012  . Thrombocytopenia, unspecified 08/14/2012  . Lymphadenopathy of right cervical region 04/01/2012  . Hyperglycemia 09/19/2011  . History of anemia 06/28/2011  . Anxiety 06/28/2011  .  Essential hypertension, benign 06/28/2011  . GERD (gastroesophageal reflux disease) 06/28/2011  . Other and unspecified hyperlipidemia 06/28/2011  . History of hysterectomy 06/28/2011  . Menopause 06/28/2011  . Hypothyroidism 06/28/2011  . Glaucoma 06/28/2011  . DJD (degenerative joint disease) 06/28/2011   Current Outpatient Prescriptions on File Prior to Visit  Medication Sig Dispense Refill  . Ascorbic Acid (VITAMIN C) 1000 MG tablet Take 1,000 mg by mouth daily.      . Calcium Carbonate-Vitamin D (CALCIUM + D PO) Take by mouth 2 (two) times daily.      . Estradiol (VAGIFEM) 10 MCG TABS vaginal tablet Insert one tablet vaginally every night for 2 weeks then decrease to one tablet twice a week  8 tablet  2  . furosemide (LASIX) 20 MG tablet Take 1 tablet (20 mg total) by mouth daily.  90 tablet  2  . glucose blood (ACCU-CHEK AVIVA PLUS) test strip 1 each by Other route as needed.  100 each  11  . Iron-Vit C-Vit B12-Folic Acid (IRON 672 PLUS PO) Take by mouth at bedtime.      . Lancets (ACCU-CHEK SOFT TOUCH) lancets Use as instructed  100 each  12  . latanoprost (XALATAN) 0.005 % ophthalmic solution Place 1 drop into both eyes at bedtime.       Marland Kitchen levothyroxine (SYNTHROID, LEVOTHROID) 75 MCG tablet TAKE 1 TABLET (75 MCG TOTAL) BY MOUTH DAILY.  90 tablet  3  . LORazepam (ATIVAN) 0.5 MG tablet Take one tablet once a day prn  30 tablet  1  . meloxicam (MOBIC) 7.5 MG tablet Take 7.5 mg by mouth daily.       . metFORMIN (GLUCOPHAGE-XR) 500 MG 24 hr tablet TAKE 1 TABLET BY MOUTH TWICE DAILY  180 tablet  2  . Omega-3 Fatty Acids (FISH OIL) 1000 MG CAPS Take by mouth every morning.      . pantoprazole (PROTONIX) 40 MG tablet Take 1 tablet (40 mg total) by mouth daily.  90 tablet  3  . potassium chloride (K-DUR) 10 MEQ tablet Take 1 tablet (10 mEq total) by mouth daily.  90 tablet  2  . Prednicarbate 0.1 % CREA Apply topically 2 (two) times daily.       . quinapril (ACCUPRIL) 40 MG tablet Take 40  mg by mouth daily.       . quinapril (ACCUPRIL) 40 MG tablet TAKE 1 TABLET (40 MG TOTAL) BY MOUTH AT BEDTIME.  90 tablet  0   No current facility-administered medications on file prior to visit.      Review of Systems See HPI     Objective:   Physical Exam Physical Exam  Nursing note and vitals reviewed.  Constitutional: She is oriented to person, place, and time. She appears well-developed and well-nourished.  HENT:  Head: Normocephalic and atraumatic.  Cardiovascular: Normal rate and regular rhythm. Exam reveals no gallop and no friction rub.  No murmur heard.  Pulmonary/Chest: Breath sounds normal. She has no wheezes. She has no rales.  Neurological: She is alert and oriented to person, place, and time.  Skin: Skin is warm and dry.   She does have healing small ulcerations  With surrounding cellulitis Ext  2+ edema localized to R LE.   No edema of left Psychiatric: She has a normal mood and affect. Her behavior is normal.              Assessment & Plan:  Unilateral LE edema  Stat U/S neg for DVT    Cellulitis   Will give Rocephin 1 gm in office today and Augmentin 500 bid for 10 days.  Keep appt with dermatolgist next week  See me in 3 weeks or sooner prn

## 2013-07-08 ENCOUNTER — Encounter: Payer: Self-pay | Admitting: Internal Medicine

## 2013-07-08 LAB — COMPREHENSIVE METABOLIC PANEL
ALT: 25 U/L (ref 0–35)
AST: 36 U/L (ref 0–37)
Albumin: 3.9 g/dL (ref 3.5–5.2)
Alkaline Phosphatase: 103 U/L (ref 39–117)
BUN: 13 mg/dL (ref 6–23)
CO2: 26 mEq/L (ref 19–32)
Calcium: 9.3 mg/dL (ref 8.4–10.5)
Chloride: 103 mEq/L (ref 96–112)
Creat: 0.73 mg/dL (ref 0.50–1.10)
Glucose, Bld: 132 mg/dL — ABNORMAL HIGH (ref 70–99)
Potassium: 4.1 mEq/L (ref 3.5–5.3)
Sodium: 137 mEq/L (ref 135–145)
Total Bilirubin: 1 mg/dL (ref 0.2–1.2)
Total Protein: 6 g/dL (ref 6.0–8.3)

## 2013-07-08 LAB — HEMOGLOBIN A1C
Hgb A1c MFr Bld: 5.3 % (ref <5.7)
Mean Plasma Glucose: 105 mg/dL (ref <117)

## 2013-07-17 ENCOUNTER — Other Ambulatory Visit: Payer: Self-pay | Admitting: Internal Medicine

## 2013-07-20 NOTE — Telephone Encounter (Signed)
Requested Medications     Medication name:  Name from pharmacy:  LORazepam (ATIVAN) 0.5 MG tablet  LORAZEPAM 0.5MG  TAB    Sig: TAKE 1 TABLET BY MOUTH DAILY AS NEEDED    Dispense: 30 tablet Refills: 0 Start: 07/17/2013  Class: Normal    Notes to pharmacy: No refills available for controlled drug    Requested on: 05/19/2013    Originally ordered on: 06/28/2011 Last refill: 06/12/2013 Order History and Details

## 2013-07-21 MED ORDER — LORAZEPAM 0.5 MG PO TABS: ORAL_TABLET | ORAL | Status: DC

## 2013-07-29 ENCOUNTER — Other Ambulatory Visit: Payer: Self-pay | Admitting: Internal Medicine

## 2013-08-03 ENCOUNTER — Other Ambulatory Visit: Payer: Self-pay | Admitting: Internal Medicine

## 2013-08-04 NOTE — Telephone Encounter (Signed)
Requested Medications     Medication name:  Name from pharmacy:  quinapril (ACCUPRIL) 40 MG tablet  QUINAPRIL HCL 40MG  TAB    Sig: TAKE 1 TABLET (40 MG TOTAL) BY MOUTH AT BEDTIME.    Dispense: 90 tablet Start: 08/03/2013  Class: Normal    Notes to pharmacy: No refills available    Requested on: 04/28/2013    Originally ordered on: 06/28/2011 Last refill: 04/28/2013 Order History and Details

## 2013-08-24 ENCOUNTER — Other Ambulatory Visit: Payer: Self-pay | Admitting: *Deleted

## 2013-08-24 NOTE — Telephone Encounter (Signed)
Fax received for refill

## 2013-08-25 MED ORDER — LORAZEPAM 0.5 MG PO TABS: ORAL_TABLET | ORAL | Status: DC

## 2013-08-25 NOTE — Telephone Encounter (Signed)
Verbal called in

## 2013-09-02 ENCOUNTER — Other Ambulatory Visit: Payer: Self-pay | Admitting: *Deleted

## 2013-09-02 ENCOUNTER — Telehealth: Payer: Self-pay | Admitting: *Deleted

## 2013-09-02 NOTE — Telephone Encounter (Signed)
Request sent 

## 2013-09-02 NOTE — Telephone Encounter (Signed)
Needs refill of Protonix 40 mg 1 tablet daily.  90 day supply.  [facility] on [address].

## 2013-09-02 NOTE — Telephone Encounter (Signed)
Pt came by office requesting refill .Will be out by tomorrow.

## 2013-09-03 MED ORDER — PANTOPRAZOLE SODIUM 40 MG PO TBEC: 40.0000 mg | DELAYED_RELEASE_TABLET | Freq: Every day | ORAL | Status: DC

## 2013-09-16 ENCOUNTER — Other Ambulatory Visit: Payer: Self-pay | Admitting: Internal Medicine

## 2013-09-16 MED ORDER — LEVOTHYROXINE SODIUM 75 MCG PO TABS: ORAL_TABLET | ORAL | Status: DC

## 2013-09-16 NOTE — Telephone Encounter (Signed)
Requested Medications     Medication name:  Name from pharmacy:  furosemide (LASIX) 20 MG tablet  FUROSEMIDE 20 MG TABLET 20 MG TAB    Sig: TAKE 1 TABLET BY MOUTH DAILY    Dispense: 90 tablet Refills: 2 Start: 09/16/2013  Class: Normal    Requested on: 06/18/2013    Originally ordered on: 06/28/2011 Last refill: 06/18/2013 Order History and Details

## 2013-10-07 ENCOUNTER — Telehealth: Payer: Self-pay

## 2013-10-07 NOTE — Telephone Encounter (Signed)
Charleene Callegari [phone]  Trudy called and wanted to be seen as soon as possible, she is experiencing some dizzy spells and some vertigo. I told her Dr Coralyn Mark was completley book until 10/21/13 at 10:00. [name] wanted to take that slot, but I also told her she may need to go to an Urgent Care and not wait till 30th.

## 2013-10-21 ENCOUNTER — Ambulatory Visit: Payer: Medicare Other | Admitting: Internal Medicine

## 2013-10-22 ENCOUNTER — Other Ambulatory Visit: Payer: Self-pay | Admitting: *Deleted

## 2013-10-22 MED ORDER — LORAZEPAM 0.5 MG PO TABS: ORAL_TABLET | ORAL | Status: DC

## 2013-10-22 NOTE — Telephone Encounter (Signed)
Refill request for Lorazepam, she would like a 2 month supply-eh

## 2013-10-23 NOTE — Telephone Encounter (Signed)
RX called into [facility]-eh 

## 2013-11-23 ENCOUNTER — Encounter: Payer: Self-pay | Admitting: Internal Medicine

## 2013-12-24 ENCOUNTER — Telehealth: Payer: Self-pay

## 2013-12-24 ENCOUNTER — Other Ambulatory Visit: Payer: Self-pay | Admitting: *Deleted

## 2013-12-24 DIAGNOSIS — E119 Type 2 diabetes mellitus without complications: Secondary | ICD-10-CM

## 2013-12-24 DIAGNOSIS — E039 Hypothyroidism, unspecified: Secondary | ICD-10-CM

## 2013-12-24 MED ORDER — LORAZEPAM 0.5 MG PO TABS: ORAL_TABLET | ORAL | Status: DC

## 2013-12-24 NOTE — Telephone Encounter (Signed)
Jessica Dennis [phone] cell [facility] has 1 more pill left, she needs a refill on her LORazepam (ATIVAN) 0.5 MG tablet

## 2013-12-24 NOTE — Telephone Encounter (Signed)
Picked up while here

## 2013-12-29 ENCOUNTER — Other Ambulatory Visit: Payer: Self-pay | Admitting: Internal Medicine

## 2013-12-29 NOTE — Telephone Encounter (Signed)
Refill request

## 2013-12-30 ENCOUNTER — Encounter: Payer: Self-pay | Admitting: Internal Medicine

## 2013-12-30 ENCOUNTER — Ambulatory Visit (INDEPENDENT_AMBULATORY_CARE_PROVIDER_SITE_OTHER): Payer: Medicare Other | Admitting: Internal Medicine

## 2013-12-30 VITALS — BP 138/86 | HR 85 | Resp 16 | Ht 66.5 in | Wt 173.0 lb

## 2013-12-30 DIAGNOSIS — E119 Type 2 diabetes mellitus without complications: Secondary | ICD-10-CM

## 2013-12-30 DIAGNOSIS — F1020 Alcohol dependence, uncomplicated: Secondary | ICD-10-CM

## 2013-12-30 DIAGNOSIS — I10 Essential (primary) hypertension: Secondary | ICD-10-CM

## 2013-12-30 DIAGNOSIS — D696 Thrombocytopenia, unspecified: Secondary | ICD-10-CM

## 2013-12-30 DIAGNOSIS — M75101 Unspecified rotator cuff tear or rupture of right shoulder, not specified as traumatic: Secondary | ICD-10-CM

## 2013-12-30 LAB — HEMOGLOBIN A1C
Hgb A1c MFr Bld: 6.1 % — ABNORMAL HIGH (ref <5.7)
Mean Plasma Glucose: 128 mg/dL — ABNORMAL HIGH (ref <117)

## 2013-12-30 LAB — COMPLETE METABOLIC PANEL WITH GFR
ALT: 30 U/L (ref 0–35)
AST: 41 U/L — ABNORMAL HIGH (ref 0–37)
Albumin: 4 g/dL (ref 3.5–5.2)
Alkaline Phosphatase: 119 U/L — ABNORMAL HIGH (ref 39–117)
BUN: 19 mg/dL (ref 6–23)
CO2: 28 mEq/L (ref 19–32)
Calcium: 9.1 mg/dL (ref 8.4–10.5)
Chloride: 101 mEq/L (ref 96–112)
Creat: 0.61 mg/dL (ref 0.50–1.10)
GFR, Est African American: >89 mL/min
GFR, Est Non African American: 87 mL/min
Glucose, Bld: 161 mg/dL — ABNORMAL HIGH (ref 70–99)
Potassium: 3.6 mEq/L (ref 3.5–5.3)
Sodium: 139 mEq/L (ref 135–145)
Total Bilirubin: 1.3 mg/dL — ABNORMAL HIGH (ref 0.2–1.2)
Total Protein: 6 g/dL (ref 6.0–8.3)

## 2013-12-30 LAB — TSH: TSH: 3.975 u[IU]/mL (ref 0.350–4.500)

## 2013-12-30 MED ORDER — HYDROCODONE-ACETAMINOPHEN 5-300 MG PO TABS: ORAL_TABLET | ORAL | Status: DC

## 2013-12-30 NOTE — Progress Notes (Signed)
   Subjective:    Patient ID: Jessica Dennis, female    DOB: [birth year], 80 y.o.   MRN: [id number]  HPI 03/2013 note Health Maintenance See scanned sheet Will schedule Dexa. Report had colonsocop 5-6 years ago [facility]   DM Check AIC today On [place] check lipids normal foot exam optho app next week  HTN cotinue meds  Diarrhea Likely due to viral Gastroenteritis Pt advised to call office for appt if does not resolve  hyperlipdemia [place] Rx medication for this  Squamous cells skin CA Will refer to new dermatologist in [place]  Hypothyroidism normal TSH recent check  See me in 3 months  TODAY:  Jessica Dennis is here for follow up on multiple issues and acute R shoulder pain  She reports R shoulder pain off and on for the past several months.  Pan with arm extension  Denies injury or trauma.    She has upcoming appt with Dr. Alvan Dame later this week .  She does not wish imaging today   DM  Fasting glucoses at home  Less than 160,  No numbness  UTD with eye exam  Thrombocytopenia/splenomegaly   See Dr. Antonieta Pert note.   Pt has not had her ultrasound that was ordered.  She does report she will drink up to 5 glasses of wine daily . Other days gin and tonic   Elevated lfts:  Certainly ETOH consumption is a contributing factor   Review of Systems    see HPI Objective:   Physical Exam Physical Exam  Nursing note and vitals reviewed.  Constitutional: She is oriented to person, place, and time. She appears well-developed and well-nourished.  HENT:  Head: Normocephalic and atraumatic.  Cardiovascular: Normal rate and regular rhythm. Exam reveals no gallop and no friction rub.  No murmur heard.  Pulmonary/Chest: Breath sounds normal. She has no wheezes. She has no rales.  Neurological: She is alert and oriented to person, place, and time.  Skin: Skin is warm and dry. M/S  Limited F/E of rightshoulder with pain Foot exam  No rashes no ulcerations  Normal microfilament     Psychiatric: She has a normal mood and affect. Her behavior is normal.        Assessment & Plan:  DM  Continue metformin  No foot exam  UTD with eye exams  Thrombocytopenia/splenomegaly:  Long discussion of the need for U/S imaging.  Low platelets could be splenic sequestration and/or chronic ETOH  Use.  I am also concerned about her liver.  She is to stop by xray today and schedule her U/S   Elevated lfts  See below   Again advised ultrasound   R shoulder impingement syndrome  Ok for a few vicodin prior to Ortho visit.  ADvised to only take one at night .  Pt is agreeable   ETOH dependence :  Advised pt of the necessity of reducing  ETOH daily consumption.   I do not get the impression pt is ready to give up her relationship with ETOH   HTN continue meds

## 2013-12-30 NOTE — Patient Instructions (Signed)
To go to xray today   To schedule her ultrasound    See me February   30 mins

## 2014-01-12 ENCOUNTER — Telehealth: Payer: Self-pay | Admitting: *Deleted

## 2014-01-12 NOTE — Telephone Encounter (Signed)
I have left Jessica Dennis two voice mails in regards to her labs. She has not yet returned my calls-eh

## 2014-01-12 NOTE — Telephone Encounter (Signed)
-----   Message from Lanice Shirts, MD sent at 12/31/2013  4:43 PM EST ----- Call TRudy and let her know that her diabetes control is fine but  Her liver blood tests are too high  Advise her to cut back on the amount of wine and gin she is drinking and be sure to get the ultrasound that Dr. Marin Olp had ordered.  I will see her in April

## 2014-01-23 ENCOUNTER — Other Ambulatory Visit: Payer: Self-pay | Admitting: Internal Medicine

## 2014-01-25 NOTE — Telephone Encounter (Signed)
Refill request

## 2014-02-05 ENCOUNTER — Other Ambulatory Visit: Payer: Self-pay | Admitting: *Deleted

## 2014-02-07 MED ORDER — QUINAPRIL HCL 40 MG PO TABS: 40.0000 mg | ORAL_TABLET | Freq: Every day | ORAL | Status: DC

## 2014-02-07 NOTE — Telephone Encounter (Signed)
Call Jessica Dennis and let her know that I refilled her BP med but remind her to get her mammogram and liver ultrasound   Route back with her response.  Orders have been in place for a while now

## 2014-02-15 NOTE — Telephone Encounter (Signed)
Spoke with [name] and she said that she is going to get these test done -eh

## 2014-02-17 ENCOUNTER — Other Ambulatory Visit: Payer: Self-pay

## 2014-02-17 DIAGNOSIS — Z1231 Encounter for screening mammogram for malignant neoplasm of breast: Secondary | ICD-10-CM

## 2014-02-22 ENCOUNTER — Other Ambulatory Visit: Payer: Self-pay | Admitting: *Deleted

## 2014-02-22 MED ORDER — LORAZEPAM 0.5 MG PO TABS: ORAL_TABLET | ORAL | Status: DC

## 2014-02-22 NOTE — Telephone Encounter (Signed)
RX phoned into [facility]

## 2014-02-25 ENCOUNTER — Other Ambulatory Visit: Payer: Self-pay | Admitting: Internal Medicine

## 2014-02-25 ENCOUNTER — Telehealth: Payer: Self-pay | Admitting: Hematology & Oncology

## 2014-02-25 DIAGNOSIS — Z1231 Encounter for screening mammogram for malignant neoplasm of breast: Secondary | ICD-10-CM

## 2014-02-25 NOTE — Telephone Encounter (Signed)
Pt aware of 3-7 appointment. Received call from radiology Dr. Coralyn Mark wants [place] to order [place] for pt. It appears pt was here once 12-2012 had [place] schedule and follow up for feb. Pt cx all appointments. Per Lexine Baton RN pt must see MD first. Pt and radiology aware.

## 2014-02-26 ENCOUNTER — Ambulatory Visit (HOSPITAL_BASED_OUTPATIENT_CLINIC_OR_DEPARTMENT_OTHER)
Admission: RE | Admit: 2014-02-26 | Discharge: 2014-02-26 | Disposition: A | Discharge status: Home / Self Care | Payer: Medicare Other | Source: Ambulatory Visit | Attending: Internal Medicine | Admitting: Internal Medicine

## 2014-02-26 DIAGNOSIS — Z1231 Encounter for screening mammogram for malignant neoplasm of breast: Secondary | ICD-10-CM

## 2014-03-02 ENCOUNTER — Ambulatory Visit: Payer: Self-pay

## 2014-03-22 ENCOUNTER — Other Ambulatory Visit: Payer: Self-pay | Admitting: *Deleted

## 2014-03-22 NOTE — Telephone Encounter (Signed)
Refill request

## 2014-03-24 MED ORDER — LORAZEPAM 0.5 MG PO TABS: ORAL_TABLET | ORAL | Status: DC

## 2014-03-24 NOTE — Telephone Encounter (Signed)
RX called into [facility]

## 2014-03-26 ENCOUNTER — Other Ambulatory Visit: Payer: Self-pay | Admitting: *Deleted

## 2014-03-26 DIAGNOSIS — C4491 Basal cell carcinoma of skin, unspecified: Secondary | ICD-10-CM

## 2014-03-29 ENCOUNTER — Ambulatory Visit (HOSPITAL_BASED_OUTPATIENT_CLINIC_OR_DEPARTMENT_OTHER): Payer: Medicare Other | Admitting: Hematology & Oncology

## 2014-03-29 ENCOUNTER — Other Ambulatory Visit (HOSPITAL_BASED_OUTPATIENT_CLINIC_OR_DEPARTMENT_OTHER): Payer: Medicare Other | Admitting: Lab

## 2014-03-29 ENCOUNTER — Encounter: Payer: Self-pay | Admitting: Hematology & Oncology

## 2014-03-29 VITALS — BP 163/73 | HR 87 | Temp 98.0°F | Resp 14 | Ht 66.0 in | Wt 168.0 lb

## 2014-03-29 DIAGNOSIS — R161 Splenomegaly, not elsewhere classified: Secondary | ICD-10-CM

## 2014-03-29 DIAGNOSIS — D696 Thrombocytopenia, unspecified: Secondary | ICD-10-CM

## 2014-03-29 DIAGNOSIS — C4491 Basal cell carcinoma of skin, unspecified: Secondary | ICD-10-CM

## 2014-03-29 LAB — CBC WITH DIFFERENTIAL (CANCER CENTER ONLY)
BASO#: 0 10*3/uL (ref 0.0–0.2)
BASO%: 0.6 % (ref 0.0–2.0)
EOS%: 3.1 % (ref 0.0–7.0)
Eosinophils Absolute: 0.2 10*3/uL (ref 0.0–0.5)
HCT: 37.4 % (ref 34.8–46.6)
HGB: 12.9 g/dL (ref 11.6–15.9)
LYMPH#: 1.1 10*3/uL (ref 0.9–3.3)
LYMPH%: 21.8 % (ref 14.0–48.0)
MCH: 34 pg (ref 26.0–34.0)
MCHC: 34.5 g/dL (ref 32.0–36.0)
MCV: 99 fL (ref 81–101)
MONO#: 0.3 10*3/uL (ref 0.1–0.9)
MONO%: 6.7 % (ref 0.0–13.0)
NEUT#: 3.3 10*3/uL (ref 1.5–6.5)
NEUT%: 67.8 % (ref 39.6–80.0)
Platelets: 71 10*3/uL — ABNORMAL LOW (ref 145–400)
RBC: 3.79 10*6/uL (ref 3.70–5.32)
RDW: 13.4 % (ref 11.1–15.7)
WBC: 4.9 10*3/uL (ref 3.9–10.0)

## 2014-03-29 LAB — CHCC SATELLITE - SMEAR

## 2014-03-29 NOTE — Progress Notes (Signed)
Referral MD  Reason for Referral: Chronic thrombocytopenia   Chief Complaint  Patient presents with  . Follow-up  : I was told to come back by my doctor  HPI: [name] is a very charming 81 year old white female. I must say, I'm surprised that we are seeing her back. We last saw her about 2-1/2 years ago. At that point time, she had thrombocytopenia. I thought that this was chronic.  Since we saw her, she's been doing okay. She's had no problems with bleeding. There's been no bruising.  She has had lab work done recently. In March 2015, blood count was 80,000. White cell count 5.5 hemoglobin 13.6. Her MCV was 96.  She has had no abdominal pain. She's had no weight loss or weight gain. Her appetite has been good. She is not a vegetarian.  His been no change in bowel or bladder habits. She has been getting her mammograms yearly. In June 2015, she had a Doppler of her right foot. This was negative for any type of thromboembolic disease.  Because of the persistent thrombocytopenia, we are asked to see her again.     Past Medical History  Diagnosis Date  . Anemia   . Anxiety   . Arthritis   . Hypertension   . Thyroid disease   . Borderline diabetes   . Diabetes mellitus without complication   . Cancer of the skin, basal cell 09/03/2012  :  Past Surgical History  Procedure Laterality Date  . Abdominal hysterectomy    . Joint replacement  97 and 06    Lt and RT hip  . Appendectomy    . Tonsillectomy    :   Current outpatient prescriptions:  .  Ascorbic Acid (VITAMIN C) 1000 MG tablet, Take 1,000 mg by mouth daily., Disp: , Rfl:  .  Calcium Carbonate-Vitamin D (CALCIUM + D PO), Take by mouth 2 (two) times daily., Disp: , Rfl:  .  furosemide (LASIX) 20 MG tablet, TAKE 1 TABLET BY MOUTH DAILY, Disp: 90 tablet, Rfl: 2 .  glucose blood (ACCU-CHEK AVIVA PLUS) test strip, 1 each by Other route as needed., Disp: 100 each, Rfl: 11 .  Iron-Vit C-Vit B12-Folic Acid (IRON 476 PLUS  PO), Take by mouth at bedtime., Disp: , Rfl:  .  Lancets (ACCU-CHEK SOFT TOUCH) lancets, Use as instructed, Disp: 100 each, Rfl: 12 .  latanoprost (XALATAN) 0.005 % ophthalmic solution, Place 1 drop into both eyes at bedtime. , Disp: , Rfl:  .  levothyroxine (SYNTHROID, LEVOTHROID) 75 MCG tablet, TAKE 1 TABLET (75 MCG TOTAL) BY MOUTH DAILY., Disp: 90 tablet, Rfl: 3 .  LORazepam (ATIVAN) 0.5 MG tablet, TAKE 1 TABLET BY MOUTH DAILY AS NEEDED, Disp: 30 tablet, Rfl: 1 .  meloxicam (MOBIC) 7.5 MG tablet, Take 7.5 mg by mouth as needed. , Disp: , Rfl:  .  metFORMIN (GLUCOPHAGE-XR) 500 MG 24 hr tablet, TAKE 1 TABLET BY MOUTH TWICE DAILY, Disp: 180 tablet, Rfl: 2 .  Omega-3 Fatty Acids (FISH OIL) 1000 MG CAPS, Take by mouth every morning., Disp: , Rfl:  .  pantoprazole (PROTONIX) 40 MG tablet, TAKE 1 TABLET (40 MG TOTAL) BY MOUTH DAILY., Disp: 30 tablet, Rfl: 0 .  potassium chloride (K-DUR) 10 MEQ tablet, TAKE 1 TABLET (10 MEQ TOTAL) BY MOUTH DAILY., Disp: 90 tablet, Rfl: 1 .  Prednicarbate 0.1 % CREA, Apply topically 2 (two) times daily. , Disp: , Rfl:  .  quinapril (ACCUPRIL) 40 MG tablet, Take 1 tablet (40 mg  total) by mouth daily., Disp: 90 tablet, Rfl: 0 .  Hydrocodone-Acetaminophen (VICODIN) 5-300 MG TABS, Take one tablet at hs prn pain (Patient not taking: Reported on 03/29/2014), Disp: 15 each, Rfl: 0:  :  No Known Allergies:  Family History  Problem Relation Age of Onset  . Cancer Father     bladder  . Heart attack Father   :  History   Social History  . Marital Status: Divorced    Spouse Name: N/A  . Number of Children: N/A  . Years of Education: N/A   Occupational History  . Not on file.   Social History Main Topics  . Smoking status: Never Smoker   . Smokeless tobacco: Never Used     Comment: NEVER USED TOBACCO  . Alcohol Use: 0.0 oz/week    0 Standard drinks or equivalent per week     Comment: glass of wine daily  . Drug Use: No  . Sexual Activity: No   Other Topics  Concern  . Not on file   Social History Narrative  :  Pertinent items are noted in HPI.  Exam: _0 @ Well developed and well nourished white female in no obvious distress. Vital signs show temperature of 98. Pulse 87. Blood pressure 163/73. Weight is 168 pounds. Head and neck exam shows no ocular or oral lesions. She has no adenopathy in the neck. Thyroid is nonpalpable. Lungs are clear. Cardiac exam regular rate and rhythm with no murmurs, rubs or bruits. Abdomen is soft. She has good bowel sounds. There is no fluid wave. Her spleen tip is palpable just at the left costal margin. There is no hepatomegaly. Back exam shows no tenderness over the spine, ribs or hips. Extremities shows no clubbing, cyanosis or edema. She has good range of motion of her joints. She has good strength. Skin exam shows no rashes, ecchymoses or petechia. Neurological exam shows no focal neurological deficits.    Recent Labs  03/29/14 1115  WBC 4.9  HGB 12.9  HCT 37.4  PLT 71*   No results for input(s): NA, K, CL, CO2, GLUCOSE, BUN, CREATININE, CALCIUM in the last 72 hours.  Blood smear revnormochromic and normocytic population of red blood cells. She has no nucleated red blood cells. I see no teardrop cells. There are no schistocytes or spherocytes. There is no rouleau formation. White cells. Normal in morphology maturation. There is no immature myeloid or lymphoid forms. I see no hypersegmented polys. There's a few monocytes. There are a few large lymphocytes. Platelets are decreased in number. Platelets are well granulated. She has a few large platelets.   Pathonone    Assessment and Plan :  [name] is a very charming 81 year old white female. She is originally from [place]. She has thrombocytopenia. She has had this for over 2 years. Her blood count is pretty stable.  I am a little concerned about the splenomegaly. Her spleen is clearly palpable.  I think that she needs to have an ultrasound  done. I want to see what the spleen looks like with respect to its size. I also want to see what the liver looks like. I don't think she has cirrhosis. She does have moderate alcohol use. She has 2 Leanna Sato 3 glasses of wine a day. She has occasional beer.  I don't think she needs a bone marrow biopsy. I don't see anything on her smear that looks suspicious for a myeloproliferative process. I don't see anything that looks like a hematologic malignancy.  I  suppose that she may have myelodysplasia given her age. However, a bone marrow biopsy is no way that we would know this.  I spent about 45 minutes with her. It was nice to see her again. It has been a long time since I have seen her.  I want her back in 3 months.

## 2014-03-30 ENCOUNTER — Other Ambulatory Visit (HOSPITAL_BASED_OUTPATIENT_CLINIC_OR_DEPARTMENT_OTHER): Payer: Medicare Other

## 2014-04-01 ENCOUNTER — Ambulatory Visit (HOSPITAL_BASED_OUTPATIENT_CLINIC_OR_DEPARTMENT_OTHER)
Admission: RE | Admit: 2014-04-01 | Discharge: 2014-04-01 | Disposition: A | Discharge status: Home / Self Care | Payer: Medicare Other | Source: Ambulatory Visit | Attending: Hematology & Oncology | Admitting: Hematology & Oncology

## 2014-04-01 DIAGNOSIS — E119 Type 2 diabetes mellitus without complications: Secondary | ICD-10-CM | POA: Diagnosis not present

## 2014-04-01 DIAGNOSIS — D696 Thrombocytopenia, unspecified: Secondary | ICD-10-CM | POA: Diagnosis not present

## 2014-04-01 DIAGNOSIS — R161 Splenomegaly, not elsewhere classified: Secondary | ICD-10-CM | POA: Diagnosis not present

## 2014-04-06 ENCOUNTER — Telehealth: Payer: Self-pay | Admitting: *Deleted

## 2014-04-06 ENCOUNTER — Telehealth: Payer: Self-pay | Admitting: Internal Medicine

## 2014-04-06 NOTE — Telephone Encounter (Addendum)
-----   Message from Volanda Napoleon, MD sent at 04/05/2014  6:34 PM EDT ----- Call - spleen is large because there is cirrhosis of the liver, probably from fatty infiltration from the diabetes.  Please send this result to her primary care MD. Her primary MD needs to address this issue!!     Results of ultrasound forwarded to Dr. Coralyn Mark.

## 2014-04-06 NOTE — Telephone Encounter (Signed)
Jessica Dennis  Call pt and let her know that her liver and spleen is abnormal  on her ultrasound and I want to review this with her.  Schedule a 30 min appt with me next week   Route back with appt date

## 2014-04-11 NOTE — Progress Notes (Signed)
Subjective:    Patient ID: Jessica Dennis, female    DOB: 01/29/1932, 81 y.o.   MRN: [id number]  HPI 03/10 2016 U/S  IMPRESSION: 1. Macronodular hepatic cirrhosis. No ascites. 2. Splenomegaly. 3. Mild gallbladder wall irregularity, query hyperplastic cholecystosis. 4. Dilated dorsal pancreatic duct, etiology uncertain. Given the lack of prior imaging, dynamic hepatic protocol MRI (to include the pancreas) may be warranted to further characterize the liver, as well as assess the pancreas for the cause of pancreatic duct dilatation. Parts of the pancreas were not well seen due to overlying bowel gas.  TODAY  Jessica Dennis is here for follow up.  U/S ordered by hematologist quite some time ago to evaluate splenomegaly associated with thrombocytopenia with results above  Jessica Dennis tells me she consumes 1/2 to one whole bottle of wine on most nights.  She reports she is not a daily drinker but can drink quite heavily.   She tells me she is cutting back.  Denies tremors on days when she is not drinking  Reports she believes she was vaccinated against hepatitis B in past   No Known Allergies Past Medical History  Diagnosis Date  . Anemia   . Anxiety   . Arthritis   . Hypertension   . Thyroid disease   . Borderline diabetes   . Diabetes mellitus without complication   . Cancer of the skin, basal cell 09/03/2012   Past Surgical History  Procedure Laterality Date  . Abdominal hysterectomy    . Joint replacement  97 and 06    Lt and RT hip  . Appendectomy    . Tonsillectomy     History   Social History  . Marital Status: Divorced    Spouse Name: N/A  . Number of Children: N/A  . Years of Education: N/A   Occupational History  . Not on file.   Social History Main Topics  . Smoking status: Never Smoker   . Smokeless tobacco: Never Used     Comment: NEVER USED TOBACCO  . Alcohol Use: 0.0 oz/week    0 Standard drinks or equivalent per week     Comment: glass of wine daily  .  Drug Use: No  . Sexual Activity: No   Other Topics Concern  . Not on file   Social History Narrative   Family History  Problem Relation Age of Onset  . Cancer Father     bladder  . Heart attack Father    Patient Active Problem List   Diagnosis Date Noted  . H/O bone density study 04/27/2013  . Diabetes 03/24/2013  . Vaginal dryness, menopausal 01/20/2013  . Atrophic vaginitis 01/20/2013  . S/P hysterectomy 01/20/2013  . Cancer of the skin, basal cell 09/03/2012  . Squamous cell skin cancer 09/03/2012  . Thrombocytopenia, unspecified 08/14/2012  . Lymphadenopathy of right cervical region 04/01/2012  . Hyperglycemia 09/19/2011  . History of anemia 06/28/2011  . Anxiety 06/28/2011  . Essential hypertension, benign 06/28/2011  . GERD (gastroesophageal reflux disease) 06/28/2011  . Other and unspecified hyperlipidemia 06/28/2011  . History of hysterectomy 06/28/2011  . Menopause 06/28/2011  . Hypothyroidism 06/28/2011  . Glaucoma 06/28/2011  . DJD (degenerative joint disease) 06/28/2011   Current Outpatient Prescriptions on File Prior to Visit  Medication Sig Dispense Refill  . Ascorbic Acid (VITAMIN C) 1000 MG tablet Take 1,000 mg by mouth daily.    . Calcium Carbonate-Vitamin D (CALCIUM + D PO) Take by mouth 2 (two) times daily.    Marland Kitchen  furosemide (LASIX) 20 MG tablet TAKE 1 TABLET BY MOUTH DAILY 90 tablet 2  . glucose blood (ACCU-CHEK AVIVA PLUS) test strip 1 each by Other route as needed. 100 each 11  . Hydrocodone-Acetaminophen (VICODIN) 5-300 MG TABS Take one tablet at hs prn pain (Patient not taking: Reported on 03/29/2014) 15 each 0  . Iron-Vit C-Vit B12-Folic Acid (IRON 326 PLUS PO) Take by mouth at bedtime.    . Lancets (ACCU-CHEK SOFT TOUCH) lancets Use as instructed 100 each 12  . latanoprost (XALATAN) 0.005 % ophthalmic solution Place 1 drop into both eyes at bedtime.     Marland Kitchen levothyroxine (SYNTHROID, LEVOTHROID) 75 MCG tablet TAKE 1 TABLET (75 MCG TOTAL) BY MOUTH  DAILY. 90 tablet 3  . LORazepam (ATIVAN) 0.5 MG tablet TAKE 1 TABLET BY MOUTH DAILY AS NEEDED 30 tablet 1  . meloxicam (MOBIC) 7.5 MG tablet Take 7.5 mg by mouth as needed.     . metFORMIN (GLUCOPHAGE-XR) 500 MG 24 hr tablet TAKE 1 TABLET BY MOUTH TWICE DAILY 180 tablet 2  . Omega-3 Fatty Acids (FISH OIL) 1000 MG CAPS Take by mouth every morning.    . pantoprazole (PROTONIX) 40 MG tablet TAKE 1 TABLET (40 MG TOTAL) BY MOUTH DAILY. 30 tablet 0  . potassium chloride (K-DUR) 10 MEQ tablet TAKE 1 TABLET (10 MEQ TOTAL) BY MOUTH DAILY. 90 tablet 1  . Prednicarbate 0.1 % CREA Apply topically 2 (two) times daily.     . quinapril (ACCUPRIL) 40 MG tablet Take 1 tablet (40 mg total) by mouth daily. 90 tablet 0   No current facility-administered medications on file prior to visit.       Review of Systems See HPI    Objective:   Physical Exam Physical Exam  Nursing note and vitals reviewed.  Constitutional: She is oriented to person, place, and time. She appears well-developed and well-nourished.  HENT:  Head: Normocephalic and atraumatic.  Cardiovascular: Normal rate and regular rhythm. Exam reveals no gallop and no friction rub.  No murmur heard.  Pulmonary/Chest: Breath sounds normal. She has no wheezes. She has no rales.  Abd :  Spleen is palpable on exam  I do not palpate hepatomegaly   BS pos  nontender throughout Neurological: She is alert and oriented to person, place, and time.  Skin: Skin is warm and dry.  Psychiatric: She has a normal mood and affect. Her behavior is normal.         Assessment & Plan:  Cirrhosis on U/S  :  Alcoholic cirrhosis likely culprit.  Again discussed abstinence but pt unwilling to go to AA or inpatient Detox/rehab.    She wants to try on her own .  Will check ANA and hepatitis panel. Spoke with Dr. Carlean Purl who will see pt.   Dilated pancreatic duct   She is very claustrophobic  Will discuss with Dr. Carlean Purl if open MRI still of equal benefit.     Splenomegaly/thromboytopenia followed by hematology   Alcohol dependence  See above

## 2014-04-12 ENCOUNTER — Ambulatory Visit (INDEPENDENT_AMBULATORY_CARE_PROVIDER_SITE_OTHER): Payer: Medicare Other | Admitting: Internal Medicine

## 2014-04-12 ENCOUNTER — Telehealth: Payer: Self-pay | Admitting: Internal Medicine

## 2014-04-12 ENCOUNTER — Encounter: Payer: Self-pay | Admitting: Internal Medicine

## 2014-04-12 ENCOUNTER — Other Ambulatory Visit: Payer: Self-pay | Admitting: Internal Medicine

## 2014-04-12 VITALS — BP 157/84 | HR 83 | Resp 16 | Wt 170.0 lb

## 2014-04-12 DIAGNOSIS — K7460 Unspecified cirrhosis of liver: Secondary | ICD-10-CM

## 2014-04-12 NOTE — Telephone Encounter (Signed)
New dx cirrhosis from [place] ? Dilated pancreatic duct Reg beer drinker  Is getting an MRI set up (to see Dr. Rudene Anda today)  Asking that I see her  We will call her tomorrow or later to arrange appointment with me in April

## 2014-04-13 LAB — ANA: Anti Nuclear Antibody(ANA): NEGATIVE

## 2014-04-13 LAB — COMPREHENSIVE METABOLIC PANEL
ALT: 26 U/L (ref 0–35)
AST: 34 U/L (ref 0–37)
Albumin: 4 g/dL (ref 3.5–5.2)
Alkaline Phosphatase: 111 U/L (ref 39–117)
BUN: 13 mg/dL (ref 6–23)
CO2: 28 mEq/L (ref 19–32)
Calcium: 9.7 mg/dL (ref 8.4–10.5)
Chloride: 103 mEq/L (ref 96–112)
Creat: 0.59 mg/dL (ref 0.50–1.10)
Glucose, Bld: 164 mg/dL — ABNORMAL HIGH (ref 70–99)
Potassium: 3.2 mEq/L — ABNORMAL LOW (ref 3.5–5.3)
Sodium: 139 mEq/L (ref 135–145)
Total Bilirubin: 1.4 mg/dL — ABNORMAL HIGH (ref 0.2–1.2)
Total Protein: 6.2 g/dL (ref 6.0–8.3)

## 2014-04-13 LAB — HEPATITIS PANEL, ACUTE
HCV Ab: NEGATIVE
Hep A IgM: NONREACTIVE
Hep B C IgM: NONREACTIVE
Hepatitis B Surface Ag: NEGATIVE

## 2014-04-13 LAB — HEPATITIS B SURFACE ANTIBODY,QUALITATIVE: Hep B S Ab: NEGATIVE

## 2014-04-13 NOTE — Progress Notes (Signed)
Called lab and added additional labs

## 2014-04-15 ENCOUNTER — Telehealth: Payer: Self-pay | Admitting: Internal Medicine

## 2014-04-15 MED ORDER — POTASSIUM CHLORIDE CRYS ER 20 MEQ PO TBCR: 20.0000 meq | EXTENDED_RELEASE_TABLET | Freq: Every day | ORAL | Status: DC

## 2014-04-15 NOTE — Telephone Encounter (Signed)
Spoke with pt and informed of lab results  Will get pancreatic protocol CT as pt is very claustrophobic and does not want MRI  K slighlty low  Will order KCL 20 meq daily p to take two of K pills until new RX picked up  S[place]  Call pt and inform about her K change  Will order xray next week

## 2014-04-15 NOTE — Telephone Encounter (Signed)
I spoke with patient about her K and the appointment with the GI doc.

## 2014-04-26 ENCOUNTER — Telehealth: Payer: Self-pay | Admitting: Internal Medicine

## 2014-04-26 ENCOUNTER — Other Ambulatory Visit: Payer: Self-pay | Admitting: *Deleted

## 2014-04-26 NOTE — Telephone Encounter (Signed)
Refill request

## 2014-04-27 ENCOUNTER — Other Ambulatory Visit: Payer: Self-pay | Admitting: Internal Medicine

## 2014-04-27 DIAGNOSIS — R935 Abnormal findings on diagnostic imaging of other abdominal regions, including retroperitoneum: Secondary | ICD-10-CM

## 2014-04-27 MED ORDER — QUINAPRIL HCL 40 MG PO TABS: 40.0000 mg | ORAL_TABLET | Freq: Every day | ORAL | Status: DC

## 2014-04-27 NOTE — Addendum Note (Signed)
Addended by: Gretchen Short on: 04/27/2014 09:37 AM   Modules accepted: Orders

## 2014-04-28 ENCOUNTER — Encounter (HOSPITAL_BASED_OUTPATIENT_CLINIC_OR_DEPARTMENT_OTHER): Payer: Self-pay

## 2014-04-28 ENCOUNTER — Ambulatory Visit (HOSPITAL_BASED_OUTPATIENT_CLINIC_OR_DEPARTMENT_OTHER): Payer: Medicare Other

## 2014-04-28 ENCOUNTER — Ambulatory Visit (HOSPITAL_BASED_OUTPATIENT_CLINIC_OR_DEPARTMENT_OTHER)
Admission: RE | Admit: 2014-04-28 | Discharge: 2014-04-28 | Disposition: A | Discharge status: Home / Self Care | Payer: Medicare Other | Source: Ambulatory Visit | Attending: Internal Medicine | Admitting: Internal Medicine

## 2014-04-28 ENCOUNTER — Telehealth: Payer: Self-pay | Admitting: Internal Medicine

## 2014-04-28 DIAGNOSIS — K5730 Diverticulosis of large intestine without perforation or abscess without bleeding: Secondary | ICD-10-CM | POA: Insufficient documentation

## 2014-04-28 DIAGNOSIS — R938 Abnormal findings on diagnostic imaging of other specified body structures: Secondary | ICD-10-CM | POA: Diagnosis not present

## 2014-04-28 DIAGNOSIS — I708 Atherosclerosis of other arteries: Secondary | ICD-10-CM | POA: Diagnosis not present

## 2014-04-28 DIAGNOSIS — K7460 Unspecified cirrhosis of liver: Secondary | ICD-10-CM | POA: Diagnosis not present

## 2014-04-28 DIAGNOSIS — R935 Abnormal findings on diagnostic imaging of other abdominal regions, including retroperitoneum: Secondary | ICD-10-CM

## 2014-04-28 MED ORDER — IOHEXOL 350 MG/ML SOLN
100.0000 mL | Freq: Once | INTRAVENOUS | Status: AC | PRN
  Administered 2014-04-28: 100 mL via INTRAVENOUS

## 2014-04-28 NOTE — Discharge Instructions (Signed)
° ° °  Outpatient Metformin Instructions (Glucophage, Glucovance, Fortamet, Riomet, Metaglip, Glumetza, Actoplus met  Avandamet, Janumet)   Patient: Jessica Dennis                                                04/28/2014:    Radiology Exam:     As part of your exam today in the Radiology Department, you were given a radiographic contrast material or x-ray dye.  Because you have had this contrast material and you are taking a Metformin drug (Glucophage, Glucovance, Avandamet, Fortamet, Riomet, Metaglip, Glumetza, Actoplus met, Actoplus Met XR, Prandimet or Janumet), please observe the following instructions:   DO NOT  Take this medication for 48 hours after your exam.  Because you have normal renal function and have no comorbidities, you may restart your medication in 48 hours with no need for a renal function test or consultation with your physician.  You have normal renal function but have some comorbidities.  Comorbidities include liver disease, alcohol overuse, heart failure, myocardial or muscular ischemia, sepsis, or other severe infection.  Therefore you should consult your physician before restarting your medication.  You have impaired renal function.  You should consult your physician before restarting your medication and you are advised to get a renal function test before restarting your medication.  Please discuss this with your physician.   Call your doctor before you start taking this medication again.  Your doctor may want to check your kidney function before you start taking this medication again.  I understand these instructions and have had an opportunity to discuss them with Radiology Department personnel.

## 2014-04-28 NOTE — Telephone Encounter (Signed)
Spoke with pt Tuesday 4/4 and advised to be sure to take K-dur 20 meq daily - she had been taking 10 meq    Will schedule ABd CT with pancreatic protocol

## 2014-04-29 ENCOUNTER — Ambulatory Visit: Payer: Medicare Other | Admitting: Internal Medicine

## 2014-05-03 ENCOUNTER — Telehealth: Payer: Self-pay | Admitting: Internal Medicine

## 2014-05-03 NOTE — Telephone Encounter (Signed)
Spoke with pt and informed of Ct results.  No mass one area of dorsal pancreatic duct 8mm.  Advised to be sure to keep May appt with GI  Pt voices understanding

## 2014-05-05 ENCOUNTER — Encounter: Payer: Medicare Other | Admitting: Internal Medicine

## 2014-05-06 ENCOUNTER — Other Ambulatory Visit: Payer: Self-pay | Admitting: *Deleted

## 2014-05-06 DIAGNOSIS — Z0000 Encounter for general adult medical examination without abnormal findings: Secondary | ICD-10-CM

## 2014-05-11 ENCOUNTER — Other Ambulatory Visit: Payer: Self-pay | Admitting: Internal Medicine

## 2014-05-12 ENCOUNTER — Encounter: Payer: Self-pay | Admitting: Internal Medicine

## 2014-05-12 ENCOUNTER — Ambulatory Visit (INDEPENDENT_AMBULATORY_CARE_PROVIDER_SITE_OTHER): Payer: Medicare Other | Admitting: Internal Medicine

## 2014-05-12 VITALS — BP 113/68 | HR 92 | Resp 16 | Ht 66.5 in | Wt 165.0 lb

## 2014-05-12 DIAGNOSIS — E119 Type 2 diabetes mellitus without complications: Secondary | ICD-10-CM

## 2014-05-12 DIAGNOSIS — Z23 Encounter for immunization: Secondary | ICD-10-CM | POA: Diagnosis not present

## 2014-05-12 DIAGNOSIS — Z0000 Encounter for general adult medical examination without abnormal findings: Secondary | ICD-10-CM | POA: Diagnosis not present

## 2014-05-12 DIAGNOSIS — K7460 Unspecified cirrhosis of liver: Secondary | ICD-10-CM | POA: Diagnosis not present

## 2014-05-12 DIAGNOSIS — Z1211 Encounter for screening for malignant neoplasm of colon: Secondary | ICD-10-CM

## 2014-05-12 DIAGNOSIS — D696 Thrombocytopenia, unspecified: Secondary | ICD-10-CM

## 2014-05-12 LAB — COMPLETE METABOLIC PANEL WITH GFR
ALT: 23 U/L (ref 0–35)
AST: 28 U/L (ref 0–37)
Albumin: 4 g/dL (ref 3.5–5.2)
Alkaline Phosphatase: 86 U/L (ref 39–117)
BUN: 14 mg/dL (ref 6–23)
CO2: 27 mEq/L (ref 19–32)
Calcium: 9.4 mg/dL (ref 8.4–10.5)
Chloride: 103 mEq/L (ref 96–112)
Creat: 0.65 mg/dL (ref 0.50–1.10)
GFR, Est African American: >89 mL/min
GFR, Est Non African American: 85 mL/min
Glucose, Bld: 169 mg/dL — ABNORMAL HIGH (ref 70–99)
Potassium: 4.3 mEq/L (ref 3.5–5.3)
Sodium: 139 mEq/L (ref 135–145)
Total Bilirubin: 1.7 mg/dL — ABNORMAL HIGH (ref 0.2–1.2)
Total Protein: 6.2 g/dL (ref 6.0–8.3)

## 2014-05-12 LAB — CBC WITH DIFFERENTIAL/PLATELET
Basophils Absolute: 0 10*3/uL (ref 0.0–0.1)
Basophils Relative: 0 % (ref 0–1)
Eosinophils Absolute: 0.3 10*3/uL (ref 0.0–0.7)
Eosinophils Relative: 6 % — ABNORMAL HIGH (ref 0–5)
HCT: 38.7 % (ref 36.0–46.0)
Hemoglobin: 13.3 g/dL (ref 12.0–15.0)
Lymphocytes Relative: 21 % (ref 12–46)
Lymphs Abs: 1 10*3/uL (ref 0.7–4.0)
MCH: 33.3 pg (ref 26.0–34.0)
MCHC: 34.4 g/dL (ref 30.0–36.0)
MCV: 96.8 fL (ref 78.0–100.0)
MPV: 12.2 fL (ref 8.6–12.4)
Monocytes Absolute: 0.3 10*3/uL (ref 0.1–1.0)
Monocytes Relative: 6 % (ref 3–12)
Neutro Abs: 3.1 10*3/uL (ref 1.7–7.7)
Neutrophils Relative %: 67 % (ref 43–77)
Platelets: 70 10*3/uL — ABNORMAL LOW (ref 150–400)
RBC: 4 MIL/uL (ref 3.87–5.11)
RDW: 14 % (ref 11.5–15.5)
WBC: 4.6 10*3/uL (ref 4.0–10.5)

## 2014-05-12 LAB — POCT URINALYSIS DIPSTICK
Bilirubin, UA: NEGATIVE
Blood, UA: NEGATIVE
Glucose, UA: NEGATIVE
Ketones, UA: NEGATIVE
Leukocytes, UA: NEGATIVE
Nitrite, UA: NEGATIVE
Protein, UA: NEGATIVE
Spec Grav, UA: 1.01
Urobilinogen, UA: NEGATIVE
pH, UA: 6

## 2014-05-12 LAB — HEMOCCULT GUIAC POC 1CARD (OFFICE): Fecal Occult Blood, POC: POSITIVE

## 2014-05-12 LAB — HEMOGLOBIN A1C
Hgb A1c MFr Bld: 5.6 % (ref <5.7)
Mean Plasma Glucose: 114 mg/dL (ref <117)

## 2014-05-12 LAB — LIPID PANEL
Cholesterol: 173 mg/dL (ref 0–200)
HDL: 65 mg/dL (ref >=46)
LDL Cholesterol: 90 mg/dL (ref 0–99)
Total CHOL/HDL Ratio: 2.7 Ratio
Triglycerides: 92 mg/dL (ref <150)
VLDL: 18 mg/dL (ref 0–40)

## 2014-05-12 LAB — VITAMIN D 25 HYDROXY (VIT D DEFICIENCY, FRACTURES): Vit D, 25-Hydroxy: 42 ng/mL (ref 30–100)

## 2014-05-12 LAB — TSH: TSH: 1.687 u[IU]/mL (ref 0.350–4.500)

## 2014-05-12 MED ORDER — LORAZEPAM 0.5 MG PO TABS: ORAL_TABLET | ORAL | Status: DC

## 2014-05-12 NOTE — Progress Notes (Signed)
Subjective:    Patient ID: Jessica Dennis, female    DOB: 1932-09-17, 81 y.o.   MRN: [id number]  HPI  04/12/2014 my note Assessment & Plan:  Cirrhosis on U/S : Alcoholic cirrhosis likely culprit. Again discussed abstinence but pt unwilling to go to AA or inpatient Detox/rehab. She wants to try on her own . Will check ANA and hepatitis panel. Spoke with Dr. Carlean Purl who will see pt.   Dilated pancreatic duct She is very claustrophobic Will discuss with Dr. Carlean Purl if open MRI still of equal benefit.   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 Anemia   . Anxiety   . Arthritis   . Hypertension   . Thyroid disease   . Borderline diabetes   . Diabetes mellitus without complication   . Cancer of the skin, basal cell 09/03/2012   Past Surgical History  Procedure Laterality Date  . Abdominal hysterectomy    . Joint replacement  97 and 06    Lt and RT hip  . Appendectomy    . Tonsillectomy     History   Social History  . Marital Status: Divorced    Spouse Name: N/A  . Number of Children: N/A  . Years of Education: N/A   Occupational History  . Not on file.   Social History Main Topics  . Smoking status: Never Smoker   . Smokeless tobacco: Never Used     Comment: NEVER USED TOBACCO  . Alcohol Use: 0.0 oz/week    0 Standard drinks or equivalent per week     Comment: glass of wine daily  . Drug Use: No  . Sexual Activity: No   Other Topics Concern  . Not on file   Social History Narrative   Family History  Problem Relation Age of Onset  . Cancer Father     bladder  . Heart attack Father    Patient Active Problem List   Diagnosis Date Noted  . H/O bone density study 04/27/2013  . Diabetes 03/24/2013  . Vaginal dryness, menopausal 01/20/2013  . Atrophic vaginitis 01/20/2013  . S/P hysterectomy  01/20/2013  . Cancer of the skin, basal cell 09/03/2012  . Squamous cell skin cancer 09/03/2012  . Thrombocytopenia, unspecified 08/14/2012  . Lymphadenopathy of right cervical region 04/01/2012  . Hyperglycemia 09/19/2011  . History of anemia 06/28/2011  . Anxiety 06/28/2011  . Essential hypertension, benign 06/28/2011  . GERD (gastroesophageal reflux disease) 06/28/2011  . Other and unspecified hyperlipidemia 06/28/2011  . History of hysterectomy 06/28/2011  . Menopause 06/28/2011  . Hypothyroidism 06/28/2011  . Glaucoma 06/28/2011  . DJD (degenerative joint disease) 06/28/2011   Current Outpatient Prescriptions on File Prior to Visit  Medication Sig Dispense Refill  . Ascorbic Acid (VITAMIN C) 1000 MG tablet Take 1,000 mg by mouth daily.    . Calcium Carbonate-Vitamin D (CALCIUM + D PO) Take by mouth 2 (two) times daily.    . furosemide (LASIX) 20 MG tablet TAKE 1 TABLET BY MOUTH DAILY 90 tablet 2  . glucose blood (ACCU-CHEK AVIVA PLUS) test strip 1 each by Other route as needed. 100 each 11  . Hydrocodone-Acetaminophen (VICODIN) 5-300 MG TABS Take one tablet at hs prn pain (Patient not taking: Reported on 03/29/2014) 15 each 0  . Iron-Vit C-Vit B12-Folic Acid (IRON 546 PLUS PO) Take  by mouth at bedtime.    . Lancets (ACCU-CHEK SOFT TOUCH) lancets Use as instructed 100 each 12  . latanoprost (XALATAN) 0.005 % ophthalmic solution Place 1 drop into both eyes at bedtime.     Marland Kitchen levothyroxine (SYNTHROID, LEVOTHROID) 75 MCG tablet TAKE 1 TABLET (75 MCG TOTAL) BY MOUTH DAILY. 90 tablet 3  . LORazepam (ATIVAN) 0.5 MG tablet TAKE 1 TABLET BY MOUTH DAILY AS NEEDED 30 tablet 1  . meloxicam (MOBIC) 7.5 MG tablet Take 7.5 mg by mouth as needed.     . metFORMIN (GLUCOPHAGE-XR) 500 MG 24 hr tablet TAKE 1 TABLET BY MOUTH TWICE DAILY 180 tablet 2  . Omega-3 Fatty Acids (FISH OIL) 1000 MG CAPS Take by mouth every morning.    . pantoprazole (PROTONIX) 40 MG tablet TAKE 1 TABLET (40 MG TOTAL) BY MOUTH  DAILY. 30 tablet 0  . potassium chloride SA (K-DUR,KLOR-CON) 20 MEQ tablet Take 1 tablet (20 mEq total) by mouth daily. 30 tablet 3  . Prednicarbate 0.1 % CREA Apply topically 2 (two) times daily.     . quinapril (ACCUPRIL) 40 MG tablet Take 1 tablet (40 mg total) by mouth daily. 90 tablet 1   No current facility-administered medications on file prior to visit.      Review of Systems See HPI    Objective:   Physical Exam Physical Exam  Nursing note and vitals reviewed.  Constitutional: She is oriented to person, place, and time. She appears well-developed and well-nourished.  HENT:  Head: Normocephalic and atraumatic.  Right Ear: Tympanic membrane and ear canal normal. No drainage. Tympanic membrane is not injected and not erythematous.  Left Ear: Tympanic membrane and ear canal normal. No drainage. Tympanic membrane is not injected and not erythematous.  Nose: Nose normal. Right sinus exhibits no maxillary sinus tenderness and no frontal sinus tenderness. Left sinus exhibits no maxillary sinus tenderness and no frontal sinus tenderness.  Mouth/Throat: Oropharynx is clear and moist. No oral lesions. No oropharyngeal exudate.  Eyes: Conjunctivae and EOM are normal. Pupils are equal, round, and reactive to light.  Neck: Normal range of motion. Neck supple. No JVD present. Carotid bruit is not present. No mass and no thyromegaly present.  Cardiovascular: Normal rate, regular rhythm, S1 normal, S2 normal and intact distal pulses. Exam reveals no gallop and no friction rub.  No murmur heard.  Pulses:  Carotid pulses are 2+ on the right side, and 2+ on the left side.  Dorsalis pedis pulses are 2+ on the right side, and 2+ on the left side.  No carotid bruit. No LE edema  Pulmonary/Chest: Breath sounds normal. She has no wheezes. She has no rales. She exhibits no tenderness.  Breast no discrete mass no nipple discharge no axillary adenopathy bilaterally Abdominal: Soft. Bowel sounds are  normal. She exhibits no distension and no mass.  Splenic enlargement  There is no tenderness. There is no CVA tenderness.  Guaiac pos hemocult Musculoskeletal: Normal range of motion.  No active synovitis to joints.  Lymphadenopathy:  She has no cervical adenopathy.  She has no axillary adenopathy.  Right: No inguinal and no supraclavicular adenopathy present.  Left: No inguinal and no supraclavicular adenopathy present.  Neurological: She is alert and oriented to person, place, and time. She has normal strength and normal reflexes. She displays no tremor. No cranial nerve deficit or sensory deficit. Coordination and gait normal.  Skin: Skin is warm and dry. No rash noted. No cyanosis. Nails show no clubbing.  Psychiatric:  She has a normal mood and affect. Her speech is normal and behavior is normal. Cognition and memory are normal.           Assessment & Plan:  HM. S/P hysterectomy  She is a non-smoker,  Has upcoming GI appt for colonoscopy   Will give hepatiits B today .  Will order Prevnar 13  Normal DEXA  2015,  Neg mm 08/7679  Cirrhosis complicated by esophageal varices and splenomegaly  Long counseling regarding  cessation of ETOH.     She has cut back quite a bit as evidenced by her improved transaminases.   Will give hepatitis B vaccine today .  She is hemocult pos.  ADvised if any black stool or obvious blood she is to call office or go to ER for eval.  She does not want coagulations studies today  Chronic Thrombocytopenia/splenomegaly;  Thrombocytopenia multifactorial due to ETOH and splenic seqestration  .  Localized pancreatic dilation   See CT 7 mm dilation but no obvious mass.   Hypothyroidism  She is euthyroid  Continue levothyroxine  ETOH dependence  See above  HTN continue quinapril, lasix  DM  Check AIC  Continue metformin   Pt received letter regarding my departure and advised to make appt this week with new PCP of choice she voices understanding

## 2014-05-13 ENCOUNTER — Encounter: Payer: Self-pay | Admitting: *Deleted

## 2014-05-17 ENCOUNTER — Telehealth: Payer: Self-pay | Admitting: *Deleted

## 2014-05-17 ENCOUNTER — Ambulatory Visit (INDEPENDENT_AMBULATORY_CARE_PROVIDER_SITE_OTHER): Payer: Medicare Other | Admitting: *Deleted

## 2014-05-17 DIAGNOSIS — Z23 Encounter for immunization: Secondary | ICD-10-CM | POA: Diagnosis not present

## 2014-05-17 NOTE — Telephone Encounter (Signed)
-----   Message from Lanice Shirts, MD sent at 05/13/2014  2:10 PM EDT ----- Call trudy and let her know her [facility] is great  Ok to mail to her

## 2014-05-17 NOTE — Telephone Encounter (Signed)
I spoke with [name] about her labs. -eh

## 2014-05-18 ENCOUNTER — Encounter: Payer: Medicare Other | Admitting: Internal Medicine

## 2014-05-25 ENCOUNTER — Other Ambulatory Visit: Payer: Self-pay | Admitting: *Deleted

## 2014-05-25 MED ORDER — FUROSEMIDE 20 MG PO TABS: 20.0000 mg | ORAL_TABLET | Freq: Every day | ORAL | Status: DC

## 2014-06-08 ENCOUNTER — Ambulatory Visit: Payer: Medicare Other | Admitting: Internal Medicine

## 2014-06-10 ENCOUNTER — Ambulatory Visit: Payer: Medicare Other | Admitting: Internal Medicine

## 2014-06-15 ENCOUNTER — Encounter: Payer: Medicare Other | Admitting: Internal Medicine

## 2014-06-15 LAB — HM DIABETES EYE EXAM

## 2014-07-19 ENCOUNTER — Other Ambulatory Visit (HOSPITAL_BASED_OUTPATIENT_CLINIC_OR_DEPARTMENT_OTHER): Payer: Medicare Other

## 2014-07-19 ENCOUNTER — Ambulatory Visit (HOSPITAL_BASED_OUTPATIENT_CLINIC_OR_DEPARTMENT_OTHER): Payer: Medicare Other | Admitting: Family

## 2014-07-19 VITALS — BP 158/68 | HR 89 | Temp 97.9°F | Resp 20 | Wt 167.0 lb

## 2014-07-19 DIAGNOSIS — D696 Thrombocytopenia, unspecified: Secondary | ICD-10-CM | POA: Diagnosis not present

## 2014-07-19 DIAGNOSIS — R161 Splenomegaly, not elsewhere classified: Secondary | ICD-10-CM

## 2014-07-19 LAB — CBC WITH DIFFERENTIAL (CANCER CENTER ONLY)
BASO#: 0 10*3/uL (ref 0.0–0.2)
BASO%: 0.6 % (ref 0.0–2.0)
EOS%: 5 % (ref 0.0–7.0)
Eosinophils Absolute: 0.2 10*3/uL (ref 0.0–0.5)
HCT: 37 % (ref 34.8–46.6)
HGB: 13 g/dL (ref 11.6–15.9)
LYMPH#: 1 10*3/uL (ref 0.9–3.3)
LYMPH%: 21.1 % (ref 14.0–48.0)
MCH: 34.6 pg — ABNORMAL HIGH (ref 26.0–34.0)
MCHC: 35.1 g/dL (ref 32.0–36.0)
MCV: 98 fL (ref 81–101)
MONO#: 0.4 10*3/uL (ref 0.1–0.9)
MONO%: 8.4 % (ref 0.0–13.0)
NEUT#: 3.1 10*3/uL (ref 1.5–6.5)
NEUT%: 64.9 % (ref 39.6–80.0)
Platelets: 64 10*3/uL — ABNORMAL LOW (ref 145–400)
RBC: 3.76 10*6/uL (ref 3.70–5.32)
RDW: 14.2 % (ref 11.1–15.7)
WBC: 4.8 10*3/uL (ref 3.9–10.0)

## 2014-07-19 LAB — CHCC SATELLITE - SMEAR

## 2014-07-19 NOTE — Progress Notes (Signed)
Hematology and Oncology Follow Up Visit  Jessica Dennis [id number] [birth year] 81 y.o. 07/19/2014   Principle Diagnosis:  Chronic thrombocytopenia  Current Therapy:   Observation    Interim History:  Jessica Dennis is here today for a follow-up. She is doing well and has had no problems since we saw her in March. Her platelet count today is 64. She has had no episodes of bruising. No anemia.  A CT in March did show hepatic cirrhosis and splenomegaly. She did have a positive hemoccult test. She has an appointment with Jessica Dennis with GI in July. She knows she will probably need a colonoscopy. She does have diverticulosis and has flares at times.  She has had no problem with infections. No fever, chills, n/v, cough, rash, dizziness, headaches, blurred vision, SOB, chest pain, palpitations, abdominal pain, constipation, diarrhea, blood in urine.  No lymphadenopathy found on exam.  No swelling, tenderness, numbness or tingling in her extremities. No new aches or pains.  She is eating well and staying hydrated. Her weight is stable.   Medications:    Medication List       This list is accurate as of: 07/19/14  8:13 PM.  Always use your most recent med list.               accu-chek soft touch lancets  Use as instructed     CALCIUM + D PO  Take by mouth 2 (two) times daily.     celecoxib 200 MG capsule  Commonly known as:  CELEBREX  200 mg daily as needed.     Fish Oil 1000 MG Caps  Take by mouth every morning.     furosemide 20 MG tablet  Commonly known as:  LASIX  Take 1 tablet (20 mg total) by mouth daily.     glucose blood test strip  Commonly known as:  ACCU-CHEK AVIVA PLUS  1 each by Other route as needed.     IRON 100 PLUS PO  Take by mouth at bedtime.     latanoprost 0.005 % ophthalmic solution  Commonly known as:  XALATAN  Place 1 drop into both eyes at bedtime.     levothyroxine 75 MCG tablet  Commonly known as:  SYNTHROID, LEVOTHROID  TAKE 1 TABLET (75 MCG  TOTAL) BY MOUTH DAILY.     LORazepam 0.5 MG tablet  Commonly known as:  ATIVAN  TAKE 1 TABLET BY MOUTH DAILY AS NEEDED     meloxicam 7.5 MG tablet  Commonly known as:  MOBIC  Take 7.5 mg by mouth 2 (two) times daily.     metFORMIN 500 MG 24 hr tablet  Commonly known as:  GLUCOPHAGE-XR  TAKE 1 TABLET BY MOUTH TWICE DAILY     pantoprazole 40 MG tablet  Commonly known as:  PROTONIX  TAKE 1 TABLET (40 MG TOTAL) BY MOUTH DAILY.     potassium chloride SA 20 MEQ tablet  Commonly known as:  K-DUR,KLOR-CON  Take 1 tablet (20 mEq total) by mouth daily.     Prednicarbate 0.1 % Crea  Apply topically 2 (two) times daily.     quinapril 40 MG tablet  Commonly known as:  ACCUPRIL  Take 1 tablet (40 mg total) by mouth daily.     vitamin C 1000 MG tablet  Take 1,000 mg by mouth daily.        Allergies: No Known Allergies  Past Medical History, Surgical history, Social history, and Family History were reviewed and updated.  Review  of Systems: All other 10 point review of systems is negative.   Physical Exam:  weight is 167 lb (75.751 kg). Her oral temperature is 97.9 F (36.6 C). Her blood pressure is 158/68 and her pulse is 89. Her respiration is 20.   Wt Readings from Last 3 Encounters:  07/19/14 167 lb (75.751 kg)  05/12/14 165 lb (74.844 kg)  04/12/14 170 lb (77.111 kg)    Ocular: Sclerae unicteric, pupils equal, round and reactive to light Ear-nose-throat: Oropharynx clear, dentition fair Lymphatic: No cervical or supraclavicular adenopathy Lungs no rales or rhonchi, good excursion bilaterally Heart regular rate and rhythm, no murmur appreciated Abd soft, nontender, positive bowel sounds MSK no focal spinal tenderness, no joint edema Neuro: non-focal, well-oriented, appropriate affect Breasts: Deferred  Lab Results  Component Value Date   WBC 4.8 07/19/2014   HGB 13.0 07/19/2014   HCT 37.0 07/19/2014   MCV 98 07/19/2014   PLT 64* 07/19/2014   No results found  for: FERRITIN, IRON, TIBC, UIBC, IRONPCTSAT Lab Results  Component Value Date   RBC 3.76 07/19/2014   No results found for: KPAFRELGTCHN, LAMBDASER, KAPLAMBRATIO No results found for: IGGSERUM, IGA, IGMSERUM No results found for: Odetta Pink, SPEI   Chemistry      Component Value Date/Time   NA 139 05/11/2014 1045   K 4.3 05/11/2014 1045   CL 103 05/11/2014 1045   CO2 27 05/11/2014 1045   BUN 14 05/11/2014 1045   CREATININE 0.65 05/11/2014 1045   CREATININE 0.60 03/13/2012 1310      Component Value Date/Time   CALCIUM 9.4 05/11/2014 1045   ALKPHOS 86 05/11/2014 1045   AST 28 05/11/2014 1045   ALT 23 05/11/2014 1045   BILITOT 1.7* 05/11/2014 1045     Impression and Plan: Jessica Dennis is a very charming 81 year old white female with chronic thrombocytopenia. She has had a positive hemoccult test recently and has an appointment with GI in July. She is not anemic and her platelet count is 64. She had hepatic cirrhosis and splenomegaly on CT in April. So far she has not experienced abdominal pain. She is doing well and has no complaints.  We will plan to see her back in 6 months for labs and follow-up.  She knows to call here with any questions or concerns. We can certainly see her sooner if need be.   Eliezer Bottom, NP 6/27/20168:13 PM

## 2014-08-10 ENCOUNTER — Ambulatory Visit (INDEPENDENT_AMBULATORY_CARE_PROVIDER_SITE_OTHER): Payer: Medicare Other | Admitting: Internal Medicine

## 2014-08-10 ENCOUNTER — Other Ambulatory Visit (INDEPENDENT_AMBULATORY_CARE_PROVIDER_SITE_OTHER): Payer: Medicare Other

## 2014-08-10 ENCOUNTER — Encounter: Payer: Self-pay | Admitting: Internal Medicine

## 2014-08-10 VITALS — BP 124/66 | HR 84 | Ht 66.0 in | Wt 167.1 lb

## 2014-08-10 DIAGNOSIS — Z23 Encounter for immunization: Secondary | ICD-10-CM | POA: Diagnosis not present

## 2014-08-10 DIAGNOSIS — R195 Other fecal abnormalities: Secondary | ICD-10-CM | POA: Diagnosis not present

## 2014-08-10 DIAGNOSIS — K7030 Alcoholic cirrhosis of liver without ascites: Secondary | ICD-10-CM | POA: Diagnosis not present

## 2014-08-10 LAB — COMPREHENSIVE METABOLIC PANEL
ALT: 27 U/L (ref 0–35)
AST: 35 U/L (ref 0–37)
Albumin: 4.3 g/dL (ref 3.5–5.2)
Alkaline Phosphatase: 97 U/L (ref 39–117)
BUN: 15 mg/dL (ref 6–23)
CO2: 29 mEq/L (ref 19–32)
Calcium: 9.7 mg/dL (ref 8.4–10.5)
Chloride: 102 mEq/L (ref 96–112)
Creatinine, Ser: 0.68 mg/dL (ref 0.40–1.20)
GFR: 88.57 mL/min (ref >60.00)
Glucose, Bld: 158 mg/dL — ABNORMAL HIGH (ref 70–99)
Potassium: 3.7 mEq/L (ref 3.5–5.1)
Sodium: 139 mEq/L (ref 135–145)
Total Bilirubin: 1.3 mg/dL — ABNORMAL HIGH (ref 0.2–1.2)
Total Protein: 6.9 g/dL (ref 6.0–8.3)

## 2014-08-10 LAB — PROTIME-INR
INR: 1.3 ratio — ABNORMAL HIGH (ref 0.8–1.0)
Prothrombin Time: 14.4 s — ABNORMAL HIGH (ref 9.6–13.1)

## 2014-08-10 NOTE — Patient Instructions (Addendum)
  You have been scheduled for an endoscopy and colonoscopy. Please follow the written instructions given to you at your visit today. Please pick up your prep supplies at the pharmacy within the next 1-3 days. If you use inhalers (even only as needed), please bring them with you on the day of your procedure. Your physician has requested that you go to www.[email or web address] and enter the access code given to you at your visit today. This web site gives a general overview about your procedure. However, you should still follow specific instructions given to you by our office regarding your preparation for the procedure.   Discontinue your iron per Dr Carlean Purl.   Your physician has requested that you go to the basement for the lab work before leaving today.  Today we are giving you your second Hepatitis B vaccine.  Please get your third one with your new PCP as you mentioned wanting to do  I appreciate the opportunity to care for you. Silvano Rusk, MD, [facility]

## 2014-08-10 NOTE — Progress Notes (Signed)
Subjective:    Patient ID: Jessica Dennis, female    DOB: July 01, 1932, 81 y.o.   MRN: [id number]  Chief Complaint: Cirrhosis and splenomegaly HPI The patient is a very nice 81 year old white woman who was discovered to have cirrhosis on imaging studies this spring. Shrunken nodular liver with signs of varices and splenomegaly on CT scan, after ultrasound suggested cirrhosis. She also has a minor pancreatic duct abnormality with a 7 mm cystic dilation in it. She is a long history of regular alcohol consumption drinking at least 2 glasses of wine every day. She's not had other problems with this that I'm aware is not inclined to stop. She has received 1 dose of hepatitis B vaccine and a pneumonia vaccine in the spring. She is a little bit fatigued at times but otherwise feels well. She's complained of some pedal edema at times. There is rare heartburn. GI review of systems is otherwise negative though she was heme positive on [facility] testing in the spring. She does not see any bleeding. No Known Allergies Outpatient Prescriptions Prior to Visit  Medication Sig Dispense Refill  . Ascorbic Acid (VITAMIN C) 1000 MG tablet Take 1,000 mg by mouth daily.    . Calcium Carbonate-Vitamin D (CALCIUM + D PO) Take by mouth 2 (two) times daily.    . celecoxib (CELEBREX) 200 MG capsule 200 mg daily as needed.    . furosemide (LASIX) 20 MG tablet Take 1 tablet (20 mg total) by mouth daily. 90 tablet 2  . glucose blood (ACCU-CHEK AVIVA PLUS) test strip 1 each by Other route as needed. 100 each 11  . Lancets (ACCU-CHEK SOFT TOUCH) lancets Use as instructed 100 each 12  . latanoprost (XALATAN) 0.005 % ophthalmic solution Place 1 drop into both eyes at bedtime.     Marland Kitchen levothyroxine (SYNTHROID, LEVOTHROID) 75 MCG tablet TAKE 1 TABLET (75 MCG TOTAL) BY MOUTH DAILY. 90 tablet 3  . LORazepam (ATIVAN) 0.5 MG tablet TAKE 1 TABLET BY MOUTH DAILY AS NEEDED 30 tablet 2  . meloxicam (MOBIC) 7.5 MG tablet Take 7.5 mg by mouth  2 (two) times daily.     . metFORMIN (GLUCOPHAGE-XR) 500 MG 24 hr tablet TAKE 1 TABLET BY MOUTH TWICE DAILY 180 tablet 2  . Omega-3 Fatty Acids (FISH OIL) 1000 MG CAPS Take by mouth every morning.    . pantoprazole (PROTONIX) 40 MG tablet TAKE 1 TABLET (40 MG TOTAL) BY MOUTH DAILY. 30 tablet 0  . potassium chloride SA (K-DUR,KLOR-CON) 20 MEQ tablet Take 1 tablet (20 mEq total) by mouth daily. 30 tablet 3  . Prednicarbate 0.1 % CREA Apply topically 2 (two) times daily.     . quinapril (ACCUPRIL) 40 MG tablet Take 1 tablet (40 mg total) by mouth daily. 90 tablet 1  . Iron-Vit C-Vit B12-Folic Acid (IRON 892 PLUS PO) Take by mouth at bedtime.     No facility-administered medications prior to visit.   Past Medical History  Diagnosis Date  . Anemia   . Anxiety   . Arthritis   . Hypertension   . Thyroid disease   . Borderline diabetes   . Diabetes mellitus without complication   . Cancer of the skin, basal cell 09/03/2012  . Cirrhosis   . GERD (gastroesophageal reflux disease)   . Diverticular disease    Past Surgical History  Procedure Laterality Date  . Abdominal hysterectomy    . Total hip arthroplasty Bilateral 1993, 2006  . Appendectomy    . Tonsillectomy    .  Colonoscopy     History   Social History  . Marital Status: Divorced    Spouse Name: N/A  . Number of Children: 2  . Years of Education: N/A   Occupational History  . retired    Social History Main Topics  . Smoking status: Never Smoker   . Smokeless tobacco: Never Used     Comment: NEVER USED TOBACCO  . Alcohol Use: 1.2 oz/week    2 Standard drinks or equivalent per week     Comment: glass of wine daily  . Drug Use: No  . Sexual Activity: No   Other Topics Concern  . None   Social History Narrative   The patient is divorced. She is originally from [place]. She has 2 sons. 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   08/10/2014      Family History  Problem Relation Age of  Onset  . Bladder Cancer Father   . Heart attack Father   . Diabetes Mother          Review of Systems As per history of present illness. Also has some joint pains.    Objective:   Physical Exam @BP  124/66 mmHg  Pulse 84  Ht 5\' 6"  (1.676 m)  Wt 167 lb 2 oz (75.807 kg)  BMI 26.99 kg/m2@  General:  Well-developed, well-nourished and in no acute distress Eyes:  anicteric. ENT:   Mouth and posterior pharynx free of lesions.  Neck:   supple w/o thyromegaly or mass.  Lungs: Clear to auscultation bilaterally. Heart:  S1S2, no rubs, murmurs, gallops. Abdomen:  soft, non-tender, firm nontender liver edge 2 FB in RUQ/epigastrium no splenomegaly, hernia, or mass and BS+.  Rectal: deferred Lymph:  no cervical or supraclavicular adenopathy. Extremities:   no edema, cyanosis or clubbing Skin   no rash. Neuro:  A&O x 3.  Psych:  appropriate mood and  Affect.   Data Reviewed: CT scan and ultrasound and labs in the EMR as described above.       Assessment & Plan:  Alcoholic cirrhosis of liver without ascites  Heme + stool  Further lab investigation with:  CMET AFP, PT/INR, second Hep B dose HAV total Ab  EGD/colonoscopy The risks and benefits as well as alternatives of endoscopic procedure(s) have been discussed and reviewed. All questions answered. The patient agrees to proceed.  Does not want to give up her wine  Does not lift heavy things and was advised to avoid or refrain from this due to risk of possible bleeding from varices.  She is on ferrous sulfate. I will have her stop that. He does not have signs of iron deficiency and iron can be toxic to the liver.

## 2014-08-11 LAB — AFP TUMOR MARKER: AFP-Tumor Marker: 4.6 ng/mL (ref <6.1)

## 2014-08-11 LAB — MITOCHONDRIAL ANTIBODIES: Mitochondrial M2 Ab, IgG: 0.16

## 2014-08-11 LAB — HEPATITIS A ANTIBODY, TOTAL: Hep A Total Ab: REACTIVE — AB

## 2014-08-11 NOTE — Progress Notes (Signed)
Quick Note:  Labs ok Immune to hepatitis A ______

## 2014-08-19 ENCOUNTER — Encounter: Payer: Self-pay | Admitting: Behavioral Health

## 2014-08-19 ENCOUNTER — Telehealth: Payer: Self-pay | Admitting: Behavioral Health

## 2014-08-19 NOTE — Telephone Encounter (Signed)
Pre-Visit Call completed with patient and chart updated.   Pre-Visit Info documented in Specialty Comments under [employer].    

## 2014-08-20 ENCOUNTER — Encounter: Payer: Self-pay | Admitting: Family Medicine

## 2014-08-20 ENCOUNTER — Ambulatory Visit (INDEPENDENT_AMBULATORY_CARE_PROVIDER_SITE_OTHER): Payer: Medicare Other | Admitting: Family Medicine

## 2014-08-20 ENCOUNTER — Telehealth: Payer: Self-pay | Admitting: Family Medicine

## 2014-08-20 VITALS — BP 144/80 | HR 81 | Temp 98.0°F | Resp 18 | Ht 66.0 in | Wt 167.2 lb

## 2014-08-20 DIAGNOSIS — R011 Cardiac murmur, unspecified: Secondary | ICD-10-CM

## 2014-08-20 DIAGNOSIS — E039 Hypothyroidism, unspecified: Secondary | ICD-10-CM

## 2014-08-20 DIAGNOSIS — E876 Hypokalemia: Secondary | ICD-10-CM | POA: Diagnosis not present

## 2014-08-20 DIAGNOSIS — F4322 Adjustment disorder with anxiety: Secondary | ICD-10-CM

## 2014-08-20 DIAGNOSIS — I10 Essential (primary) hypertension: Secondary | ICD-10-CM

## 2014-08-20 DIAGNOSIS — K219 Gastro-esophageal reflux disease without esophagitis: Secondary | ICD-10-CM | POA: Diagnosis not present

## 2014-08-20 DIAGNOSIS — D696 Thrombocytopenia, unspecified: Secondary | ICD-10-CM | POA: Diagnosis not present

## 2014-08-20 MED ORDER — LORAZEPAM 0.5 MG PO TABS: ORAL_TABLET | ORAL | Status: DC

## 2014-08-20 MED ORDER — QUINAPRIL HCL 40 MG PO TABS: 40.0000 mg | ORAL_TABLET | Freq: Every day | ORAL | Status: DC

## 2014-08-20 MED ORDER — MELOXICAM 7.5 MG PO TABS: 7.5000 mg | ORAL_TABLET | Freq: Two times a day (BID) | ORAL | Status: DC

## 2014-08-20 MED ORDER — POTASSIUM CHLORIDE CRYS ER 20 MEQ PO TBCR: 20.0000 meq | EXTENDED_RELEASE_TABLET | Freq: Every day | ORAL | Status: DC

## 2014-08-20 MED ORDER — PANTOPRAZOLE SODIUM 40 MG PO TBEC: 40.0000 mg | DELAYED_RELEASE_TABLET | Freq: Every day | ORAL | Status: DC

## 2014-08-20 MED ORDER — FUROSEMIDE 20 MG PO TABS: 20.0000 mg | ORAL_TABLET | Freq: Every day | ORAL | Status: DC

## 2014-08-20 MED ORDER — LEVOTHYROXINE SODIUM 75 MCG PO TABS: ORAL_TABLET | ORAL | Status: DC

## 2014-08-20 NOTE — Telephone Encounter (Signed)
Her cpe would be due in April/ May with labs --- esp for thyroid--- tsh and labs would need to be done by then

## 2014-08-20 NOTE — Telephone Encounter (Signed)
AVS 08/20/14 return in about 5 months for hep b #3, annual exam, fasting. Pt last CPE 05/12/14 with Dr. Coralyn Mark. Pt did not want to schedule a f/u visit. States she is perfectly healthy. Please advise.

## 2014-08-20 NOTE — Progress Notes (Signed)
Pre visit review using our clinic review tool, if applicable. No additional management support is needed unless otherwise documented below in the visit note. 

## 2014-08-20 NOTE — Telephone Encounter (Signed)
To MD for review     KP 

## 2014-08-20 NOTE — Progress Notes (Signed)
Patient ID: Jessica Dennis, female    DOB: 1932/12/06  Age: 81 y.o. MRN: [id number]    Subjective:  Subjective HPI Jessica Dennis presents to establish care.  No complaints.    Review of Systems  Constitutional: Negative for diaphoresis, appetite change, fatigue and unexpected weight change.  Eyes: Negative for pain, redness and visual disturbance.  Respiratory: Negative for cough, chest tightness, shortness of breath and wheezing.   Cardiovascular: Negative for chest pain, palpitations and leg swelling.  Endocrine: Negative for cold intolerance, heat intolerance, polydipsia, polyphagia and polyuria.  Genitourinary: Negative for dysuria, frequency and difficulty urinating.  Neurological: Negative for dizziness, light-headedness, numbness and headaches.    History Past Medical History  Diagnosis Date  . Anemia   . Anxiety   . Arthritis   . Hypertension   . Thyroid disease   . Borderline diabetes   . Diabetes mellitus without complication   . Cancer of the skin, basal cell 09/03/2012  . Cirrhosis   . GERD (gastroesophageal reflux disease)   . Diverticular disease     She has past surgical history that includes Abdominal hysterectomy; Total hip arthroplasty (Bilateral, 1993, 2006); Appendectomy; Tonsillectomy; and Colonoscopy.   Her family history includes Bladder Cancer in her father; Diabetes in her mother; Heart attack in her father.She reports that she has never smoked. She has never used smokeless tobacco. She reports that she drinks about 1.2 oz of alcohol per week. She reports that she does not use illicit drugs.  Current Outpatient Prescriptions on File Prior to Visit  Medication Sig Dispense Refill  . Ascorbic Acid (VITAMIN C) 1000 MG tablet Take 1,000 mg by mouth daily.    . Calcium Carbonate-Vitamin D (CALCIUM + D PO) Take by mouth 2 (two) times daily.    . celecoxib (CELEBREX) 200 MG capsule 200 mg daily as needed.    Marland Kitchen glucose blood (ACCU-CHEK AVIVA PLUS) test  strip 1 each by Other route as needed. 100 each 11  . Lancets (ACCU-CHEK SOFT TOUCH) lancets Use as instructed 100 each 12  . latanoprost (XALATAN) 0.005 % ophthalmic solution Place 1 drop into both eyes at bedtime.     . metFORMIN (GLUCOPHAGE-XR) 500 MG 24 hr tablet TAKE 1 TABLET BY MOUTH TWICE DAILY 180 tablet 2  . Omega-3 Fatty Acids (FISH OIL) 1000 MG CAPS Take by mouth every morning.    . Prednicarbate 0.1 % CREA Apply topically 2 (two) times daily.      No current facility-administered medications on file prior to visit.     Objective:  Objective Physical Exam  Constitutional: She is oriented to person, place, and time. She appears well-developed and well-nourished.  HENT:  Head: Normocephalic and atraumatic.  Eyes: Conjunctivae and EOM are normal.  Neck: Normal range of motion. Neck supple. No JVD present. Carotid bruit is not present. No thyromegaly present.  Cardiovascular: Normal rate and regular rhythm.   Murmur heard. Pulmonary/Chest: Effort normal and breath sounds normal. No respiratory distress. She has no wheezes. She has no rales. She exhibits no tenderness.  Musculoskeletal: She exhibits no edema.  Neurological: She is alert and oriented to person, place, and time.  Psychiatric: She has a normal mood and affect.   BP 144/80 mmHg  Pulse 81  Temp(Src) 98 F (36.7 C) (Oral)  Resp 18  Ht 5\' 6"  (1.676 m)  Wt 167 lb 3.2 oz (75.841 kg)  BMI 27.00 kg/m2  SpO2 96% Wt Readings from Last 3 Encounters:  08/20/14 167 lb 3.2 oz (  75.841 kg)  08/10/14 167 lb 2 oz (75.807 kg)  07/19/14 167 lb (75.751 kg)     Lab Results  Component Value Date   WBC 4.8 07/19/2014   HGB 13.0 07/19/2014   HCT 37.0 07/19/2014   PLT 64* 07/19/2014   GLUCOSE 158* 08/10/2014   CHOL 173 05/11/2014   TRIG 92 05/11/2014   HDL 65 05/11/2014   LDLCALC 90 05/11/2014   ALT 27 08/10/2014   AST 35 08/10/2014   NA 139 08/10/2014   K 3.7 08/10/2014   CL 102 08/10/2014   CREATININE 0.68  08/10/2014   BUN 15 08/10/2014   CO2 29 08/10/2014   TSH 1.687 05/11/2014   INR 1.3* 08/10/2014   HGBA1C 5.6 05/11/2014   MICROALBUR 0.78 03/23/2013    Ct Abdomen Pelvis W Wo Contrast  04/29/2014   CLINICAL DATA:  81 year old female with abnormal pancreatic duct noted on prior ultrasound examination. History of cirrhosis.  EXAM: CT ABDOMEN AND PELVIS WITHOUT AND WITH CONTRAST  TECHNIQUE: Multidetector CT imaging of the abdomen and pelvis was performed following the standard protocol before and following the bolus administration of intravenous contrast.  CONTRAST:  179mL OMNIPAQUE IOHEXOL 350 MG/ML SOLN  COMPARISON:  Abdominal ultrasound 04/01/2014.  FINDINGS: Lower chest:  Calcifications of the mitral annulus.  Hepatobiliary: The liver has a very shrunken appearance and nodular contour, compatible with cirrhosis. No definite hypervascular lesion identified within the liver on arterial phase imaging. Portal venous phase and delayed imaging also demonstrate no suspicious cystic or solid hepatic lesions. No intra or extrahepatic biliary ductal dilatation. Gallbladder is normal in appearance. Portal vein is dilated measuring 16 mm. Recannulized paraumbilical vein with numerous large portosystemic collateral vessels, including large esophageal varices.  Pancreas: The pancreatic duct is generally normal in caliber, measuring 2-3 mm in the head of the pancreas, and 3 mm in most of the body and tail of the pancreas. However, in the proximal body there is one focal area of dilatation of the dorsal pancreatic duct up to 7 mm. No discrete pancreatic mass. No pancreatic or peripancreatic fluid collections or inflammatory changes.  Spleen: The spleen is markedly enlarged measuring 17.1 x 7.1 x 16.5 cm (estimated splenic volume of 1,002 mL).  Adrenals/Urinary Tract: Small calcification in the lateral limb of the left adrenal gland, likely related to remote adrenal infection or remote adrenal hemorrhage. Right adrenal  gland is normal in appearance. Several sub cm low-attenuation lesions are noted in the kidneys bilaterally, which are too small to definitively characterize, but are statistically favored to represent tiny cysts. No suspicious renal lesions are noted. No hydroureteronephrosis. Urinary bladder is largely obscured by beam hardening artifact from the patient's bilateral total hip arthroplasties.  Stomach/Bowel: The appearance of the stomach is generally normal, although there are numerous large varices along the lesser curvature of the stomach near the gastroesophageal junction. No pathologic dilatation of small bowel or colon. Numerous colonic diverticulae are noted, particularly in the sigmoid colon, without definite surrounding inflammatory changes to suggest an acute diverticulitis at this time (much of the sigmoid colon is obscured by beam hardening artifact).  Vascular/Lymphatic: Atherosclerosis throughout the abdominal and pelvic vasculature, without evidence of aneurysm or dissection. Single renal arteries bilaterally. Celiac axis, superior mesenteric artery and inferior mesenteric artery are all widely patent. No lymphadenopathy noted in the abdomen or pelvis (portions of the pelvis cannot be evaluated secondary to beam hardening artifact).  Reproductive: The uterus is not confidently identified, and is either surgically absent or atrophic  and obscured by beam hardening artifact. Ovaries are atrophic.  Other: No significant volume of ascites.  No pneumoperitoneum.  Musculoskeletal: There are no aggressive appearing lytic or blastic lesions noted in the visualized portions of the skeleton. Status post bilateral total hip arthroplasty.  IMPRESSION: 1. While the majority of the pancreatic duct is normal in caliber, in the proximal body of the pancreas there is mild focal ductal dilatation up to 7 mm. This is of uncertain etiology and significance, but repeat evaluation with MRI of the abdomen with and without IV  gadolinium in 1 year is recommended to ensure the stability of this finding. 2. Hepatic cirrhosis with stigmata of portal hypertension, including severe splenomegaly and numerous portosystemic collateral pathways, most notable for esophageal varices. 3. Colonic diverticulosis without evidence of acute diverticulitis at this time. 4. Atherosclerosis. 5. Additional incidental findings, as above.   Electronically Signed   By: Vinnie Langton M.D.   On: 04/29/2014 09:09     Assessment & Plan:  Plan I have changed Ms. Gunn's meloxicam. I am also having her maintain her Prednicarbate, latanoprost, Fish Oil, Calcium Carbonate-Vitamin D (CALCIUM + D PO), vitamin C, glucose blood, accu-chek soft touch, metFORMIN, celecoxib, levothyroxine, pantoprazole, potassium chloride SA, furosemide, quinapril, and LORazepam.  Meds ordered this encounter  Medications  . levothyroxine (SYNTHROID, LEVOTHROID) 75 MCG tablet    Sig: TAKE 1 TABLET (75 MCG TOTAL) BY MOUTH DAILY.    Dispense:  90 tablet    Refill:  1  . meloxicam (MOBIC) 7.5 MG tablet    Sig: Take 1 tablet (7.5 mg total) by mouth 2 (two) times daily.    Dispense:  180 tablet    Refill:  1    DISCONTINUE ALL PREVIOUS REFILLS FOR THIS MEDICATION  . pantoprazole (PROTONIX) 40 MG tablet    Sig: Take 1 tablet (40 mg total) by mouth daily.    Dispense:  90 tablet    Refill:  1    DISCONTINUE ALL PREVIOUS REFILLS FOR THIS MEDICATION  . potassium chloride SA (K-DUR,KLOR-CON) 20 MEQ tablet    Sig: Take 1 tablet (20 mEq total) by mouth daily.    Dispense:  90 tablet    Refill:  1    DISCONTINUE ALL PREVIOUS REFILLS FOR THIS MEDICATION  . furosemide (LASIX) 20 MG tablet    Sig: Take 1 tablet (20 mg total) by mouth daily.    Dispense:  90 tablet    Refill:  1  . quinapril (ACCUPRIL) 40 MG tablet    Sig: Take 1 tablet (40 mg total) by mouth daily.    Dispense:  90 tablet    Refill:  1    DISCONTINUE ALL PREVIOUS REFILLS FOR THIS MEDICATION  . LORazepam  (ATIVAN) 0.5 MG tablet    Sig: TAKE 1 TABLET BY MOUTH DAILY AS NEEDED    Dispense:  90 tablet    Refill:  0    Problem List Items Addressed This Visit    Thrombocytopenia - Primary   Relevant Medications   meloxicam (MOBIC) 7.5 MG tablet   Hypothyroidism   Relevant Medications   levothyroxine (SYNTHROID, LEVOTHROID) 75 MCG tablet   GERD (gastroesophageal reflux disease)   Relevant Medications   pantoprazole (PROTONIX) 40 MG tablet    Other Visit Diagnoses    Hypokalemia        Relevant Medications    potassium chloride SA (K-DUR,KLOR-CON) 20 MEQ tablet    Essential hypertension        Relevant Medications  furosemide (LASIX) 20 MG tablet    quinapril (ACCUPRIL) 40 MG tablet    Adjustment disorder with anxious mood        Relevant Medications    LORazepam (ATIVAN) 0.5 MG tablet    Newly recognized murmur        Relevant Orders    ECHOCARDIOGRAM COMPLETE       Follow-up: Return in about 5 months (around 01/20/2015), or if symptoms worsen or fail to improve, for hep B #3, annual exam, fasting.  Garnet Koyanagi, DO

## 2014-08-20 NOTE — Patient Instructions (Signed)
Heart Murmur A heart murmur is an extra sound heard by your health care provider when listening to your heart with a device called a stethoscope. The sound comes from turbulence when blood flows through the heart and may be a "hum" or "whoosh" sound heard when the heart beats. There are two types of heart murmurs:  Innocent murmurs. Most people with this type of heart murmur do not have a heart problem. Many children have innocent heart murmurs. Your health care provider may suggest some basic testing to know whether your murmur is an innocent murmur. If an innocent heart murmur is found, there is no need for further tests or treatment and no need to restrict activities or stop playing sports.  Abnormal murmurs. These types of murmurs can occur in children and adults. In children, abnormal heart murmurs are typically caused from heart defects that are present at birth (congenital). In adults, abnormal murmurs are usually from heart valve problems caused by disease, infection, or aging. CAUSES  All heart murmurs are a result of an issue with your heart valves. Normally, these valves open to let blood flow through or out of your heart and then shut to keep it from flowing backward. If they do not work properly, you could have:  Regurgitation--When blood leaks back through the valve in the wrong direction.  Mitral valve prolapse--When the mitral valve of the heart has a loose flap and does not close tightly.  Stenosis--When the valve does not open enough and blocks blood flow. SIGNS AND SYMPTOMS  Innocent murmurs do not cause symptoms, and many people with abnormal murmurs may or may not have symptoms. If symptoms do develop, they may include:  Shortness of breath.  Blue coloring of the skin, especially on the fingertips.  Chest pain.  Palpitations, or feeling a fluttering or skipped heartbeat.  Fainting.  Persistent cough.  Getting tired much faster than expected. DIAGNOSIS  A heart  murmur might be heard during a sports physical or during any type of examination. When a murmur is heard, it may suggest a possible problem. When this happens, your health care provider may ask you to see a heart specialist (cardiologist). You may also be asked to have one or more heart tests. In these cases, testing may vary depending on what your health care provider heard. Tests for a heart murmur may include:  Electrocardiogram.  Echocardiogram.  MRI. For children and adults who have an abnormal heart murmur and want to play sports, it is important to complete testing, review test results, and receive recommendations from your health care provider. If heart disease is present, it may not be safe to play. TREATMENT  Innocent murmurs require no treatment or activity restriction. If an abnormal murmur represents a problem with the heart, treatment will depend on the exact nature of the problem. In these cases, medicine or surgery may be needed to treat the problem. HOME CARE INSTRUCTIONS If you want to participate in sports or other types of strenuous physical activity, it is important to discuss this first with your health care provider. If the murmur represents a problem with the heart and you choose to participate in sports, there is a small chance that a serious problem (including sudden death) could result.  SEEK MEDICAL CARE IF:   You feel that your symptoms are slowly worsening.  You develop any new symptoms that cause concern.  You feel that you are having side effects from any medicines prescribed. SEEK IMMEDIATE MEDICAL CARE IF:     You develop chest pain.  You have shortness of breath.  You notice that your heart beats irregularly often enough to cause you to worry.  You have fainting spells.  Your symptoms suddenly get worse. Document Released: 02/16/2004 Document Revised: 01/13/2013 Document Reviewed: 09/15/2012 Exit[employer] Patient Information 2015 [employer]. This  information is not intended to replace advice given to you by your health care provider. Make sure you discuss any questions you have with your health care provider.  

## 2014-08-30 ENCOUNTER — Other Ambulatory Visit: Payer: Self-pay

## 2014-08-30 DIAGNOSIS — E039 Hypothyroidism, unspecified: Secondary | ICD-10-CM

## 2014-08-30 DIAGNOSIS — D696 Thrombocytopenia, unspecified: Secondary | ICD-10-CM

## 2014-08-30 DIAGNOSIS — K219 Gastro-esophageal reflux disease without esophagitis: Secondary | ICD-10-CM

## 2014-08-30 MED ORDER — PANTOPRAZOLE SODIUM 40 MG PO TBEC: 40.0000 mg | DELAYED_RELEASE_TABLET | Freq: Every day | ORAL | Status: DC

## 2014-08-30 MED ORDER — MELOXICAM 7.5 MG PO TABS: 7.5000 mg | ORAL_TABLET | Freq: Two times a day (BID) | ORAL | Status: DC

## 2014-08-30 MED ORDER — LEVOTHYROXINE SODIUM 75 MCG PO TABS: ORAL_TABLET | ORAL | Status: DC

## 2014-08-30 NOTE — Telephone Encounter (Signed)
Previous Rx printed by [name]. Med's have been re-faxed.      KP

## 2014-09-01 ENCOUNTER — Ambulatory Visit (HOSPITAL_BASED_OUTPATIENT_CLINIC_OR_DEPARTMENT_OTHER)
Admission: RE | Admit: 2014-09-01 | Discharge: 2014-09-01 | Disposition: A | Discharge status: Home / Self Care | Payer: Medicare Other | Source: Ambulatory Visit | Attending: Family Medicine | Admitting: Family Medicine

## 2014-09-01 DIAGNOSIS — I10 Essential (primary) hypertension: Secondary | ICD-10-CM | POA: Insufficient documentation

## 2014-09-01 DIAGNOSIS — I517 Cardiomegaly: Secondary | ICD-10-CM | POA: Insufficient documentation

## 2014-09-01 DIAGNOSIS — E785 Hyperlipidemia, unspecified: Secondary | ICD-10-CM | POA: Diagnosis not present

## 2014-09-01 DIAGNOSIS — I059 Rheumatic mitral valve disease, unspecified: Secondary | ICD-10-CM | POA: Insufficient documentation

## 2014-09-01 DIAGNOSIS — R011 Cardiac murmur, unspecified: Secondary | ICD-10-CM | POA: Diagnosis not present

## 2014-09-01 NOTE — Progress Notes (Signed)
*  PRELIMINARY RESULTS* Echocardiogram 2D Echocardiogram has been performed.  Leavy Cella 09/01/2014, 1:49 PM

## 2014-09-30 ENCOUNTER — Encounter: Payer: Medicare Other | Admitting: Internal Medicine

## 2014-10-08 ENCOUNTER — Telehealth: Payer: Self-pay

## 2014-10-08 MED ORDER — METFORMIN HCL ER 500 MG PO TB24: 500.0000 mg | ORAL_TABLET | Freq: Two times a day (BID) | ORAL | Status: DC

## 2014-10-08 NOTE — Telephone Encounter (Signed)
Metformin faxed to [place].     KP

## 2014-10-25 ENCOUNTER — Ambulatory Visit (AMBULATORY_SURGERY_CENTER): Payer: Medicare Other | Admitting: Internal Medicine

## 2014-10-25 ENCOUNTER — Encounter: Payer: Self-pay | Admitting: Internal Medicine

## 2014-10-25 VITALS — BP 144/76 | HR 71 | Temp 97.6°F | Resp 18 | Ht 66.0 in | Wt 167.0 lb

## 2014-10-25 DIAGNOSIS — K7030 Alcoholic cirrhosis of liver without ascites: Secondary | ICD-10-CM

## 2014-10-25 DIAGNOSIS — K5520 Angiodysplasia of colon without hemorrhage: Secondary | ICD-10-CM | POA: Diagnosis not present

## 2014-10-25 DIAGNOSIS — D120 Benign neoplasm of cecum: Secondary | ICD-10-CM | POA: Diagnosis not present

## 2014-10-25 DIAGNOSIS — R195 Other fecal abnormalities: Secondary | ICD-10-CM

## 2014-10-25 DIAGNOSIS — D128 Benign neoplasm of rectum: Secondary | ICD-10-CM

## 2014-10-25 HISTORY — DX: Angiodysplasia of colon without hemorrhage: K55.20

## 2014-10-25 MED ORDER — SODIUM CHLORIDE 0.9 % IV SOLN: 500.0000 mL | INTRAVENOUS | Status: DC

## 2014-10-25 NOTE — Op Note (Signed)
[place]  [address] [place], [id number]   COLONOSCOPY PROCEDURE REPORT  PATIENT: Jessica Dennis, Jessica Dennis  MR#: [id number] BIRTHDATE: 07/19/1932 , 89  yrs. old GENDER: female ENDOSCOPIST: Gatha Mayer, MD, [facility] PROCEDURE DATE:  10/25/2014 PROCEDURE:   Colonoscopy, diagnostic and Colonoscopy with snare polypectomy First Screening Colonoscopy - Avg.  risk and is 50 yrs.  old or older - No.  Prior Negative Screening - Now for repeat screening. N/A  History of Adenoma - Now for follow-up colonoscopy & has been > or = to 3 yrs.  N/A  Polyps removed today? Yes ASA CLASS:   Class II INDICATIONS:Evaluation of unexplained GI bleeding, Patient is not applicable for Colorectal Neoplasm Risk Assessment for this procedure, and heme +. MEDICATIONS: Residual sedation present, Propofol 200 mg IV, and Monitored anesthesia care  DESCRIPTION OF PROCEDURE:   After the risks benefits and alternatives of the procedure were thoroughly explained, informed consent was obtained.  The digital rectal exam revealed no abnormalities of the rectum.   The LB [id number]  endoscope was introduced through the anus and advanced to the cecum, which was identified by both the appendix and ileocecal valve. No adverse events experienced.   The quality of the prep was good.  (MiraLax was used)  The instrument was then slowly withdrawn as the colon was fully examined. Estimated blood loss is zero unless otherwise noted in this procedure report.  COLON FINDINGS: 5 mm angiodysplastic lesion was found in the ascending colon.   A polypoid shaped sessile polyp measuring 3 mm in size was found in the rectum.  A polypectomy was performed with a cold snare.  The resection was complete, the polyp tissue was completely retrieved and sent to histology.   There was severe diverticulosis noted in the sigmoid colon.   The examination was otherwise normal.  Retroflexed views revealed no abnormalities. The time  to cecum = 3.6 Withdrawal time = 9.2   The scope was withdrawn and the procedure completed. COMPLICATIONS: There were no immediate complications.  ENDOSCOPIC IMPRESSION: 1.   47mm angiodysplastic lesion in the ascending colon - not bleeding but a source of heme + stool 2.   Sessile polyp was found in the rectum; polypectomy was performed with a cold snare 3.   Severe diverticulosis was noted in the sigmoid colon 4.   The examination was otherwise normal  RECOMMENDATIONS: Routine repeat colonoscopy screening not necessary.  See me in office early 2017 If has signs of bleeding problems from angiodysplasia could ablate with APC at hospital  eSigned:  Gatha Mayer, MD, [facility] 10/25/2014 11:28 AM   cc: Dr. Garnet Koyanagi and The Patient

## 2014-10-25 NOTE — Progress Notes (Signed)
Called to room to assist during endoscopic procedure.  Patient ID and intended procedure confirmed with present staff. Received instructions for my participation in the procedure from the performing physician.  

## 2014-10-25 NOTE — Patient Instructions (Addendum)
The esophagus, stomach and duodenum are normal.  There was a small rectal polyp that I removed during colonoscopy. There is also a small AVM or angiodysplasia lesion in the colon that is the likely source of microscopic blood in the stool.  Please make an appointment to see me in early 2017 (office). I suggest you call in December.   I appreciate the opportunity to care for you. Gatha Mayer, MD, FACG YOU HAD AN ENDOSCOPIC PROCEDURE TODAY AT [facility]:   Refer to the procedure report that was given to you for any specific questions about what was found during the examination.  If the procedure report does not answer your questions, please call your gastroenterologist to clarify.  If you requested that your care partner not be given the details of your procedure findings, then the procedure report has been included in a sealed envelope for you to review at your convenience later.  YOU SHOULD EXPECT: Some feelings of bloating in the abdomen. Passage of more gas than usual.  Walking can help get rid of the air that was put into your GI tract during the procedure and reduce the bloating. If you had a lower endoscopy (such as a colonoscopy or flexible sigmoidoscopy) you may notice spotting of blood in your stool or on the toilet paper. If you underwent a bowel prep for your procedure, you may not have a normal bowel movement for a few days.  Please Note:  You might notice some irritation and congestion in your nose or some drainage.  This is from the oxygen used during your procedure.  There is no need for concern and it should clear up in a day or so.  SYMPTOMS TO REPORT IMMEDIATELY:   Following lower endoscopy (colonoscopy or flexible sigmoidoscopy):  Excessive amounts of blood in the stool  Significant tenderness or worsening of abdominal pains  Swelling of the abdomen that is new, acute  Fever of 100F or higher   For urgent or emergent issues, a  gastroenterologist can be reached at any hour by calling [phone].   DIET: Your first meal following the procedure should be a small meal and then it is ok to progress to your normal diet. Heavy or fried foods are harder to digest and may make you feel nauseous or bloated.  Likewise, meals heavy in dairy and vegetables can increase bloating.  Drink plenty of fluids but you should avoid alcoholic beverages for 24 hours.  ACTIVITY:  You should plan to take it easy for the rest of today and you should NOT DRIVE or use heavy machinery until tomorrow (because of the sedation medicines used during the test).    FOLLOW UP: Our staff will call the number listed on your records the next business day following your procedure to check on you and address any questions or concerns that you may have regarding the information given to you following your procedure. If we do not reach you, we will leave a message.  However, if you are feeling well and you are not experiencing any problems, there is no need to return our call.  We will assume that you have returned to your regular daily activities without incident.  If any biopsies were taken you will be contacted by phone or by letter within the next 1-3 weeks.  Please call us at [phone] if you have not heard about the biopsies in 3 weeks.    SIGNATURES/CONFIDENTIALITY: You and/or your care partner  have signed paperwork which will be entered into your electronic medical record.  These signatures attest to the fact that that the information above on your After Visit Summary has been reviewed and is understood.  Full responsibility of the confidentiality of this discharge information lies with you and/or your care-partner.YOU HAD AN ENDOSCOPIC PROCEDURE TODAY AT [facility]:   Refer to the procedure report that was given to you for any specific questions about what was found during the examination.  If the procedure report does not answer  your questions, please call your gastroenterologist to clarify.  If you requested that your care partner not be given the details of your procedure findings, then the procedure report has been included in a sealed envelope for you to review at your convenience later.  YOU SHOULD EXPECT: Some feelings of bloating in the abdomen. Passage of more gas than usual.  Walking can help get rid of the air that was put into your GI tract during the procedure and reduce the bloating. If you had a lower endoscopy (such as a colonoscopy or flexible sigmoidoscopy) you may notice spotting of blood in your stool or on the toilet paper. If you underwent a bowel prep for your procedure, you may not have a normal bowel movement for a few days.  Please Note:  You might notice some irritation and congestion in your nose or some drainage.  This is from the oxygen used during your procedure.  There is no need for concern and it should clear up in a day or so.  SYMPTOMS TO REPORT IMMEDIATELY:   Following lower endoscopy (colonoscopy or flexible sigmoidoscopy):  Excessive amounts of blood in the stool  Significant tenderness or worsening of abdominal pains  Swelling of the abdomen that is new, acute  Fever of 100F or higher   Following upper endoscopy (EGD)  Vomiting of blood or coffee ground material  New chest pain or pain under the shoulder blades  Painful or persistently difficult swallowing  New shortness of breath  Fever of 100F or higher  Black, tarry-looking stools  For urgent or emergent issues, a gastroenterologist can be reached at any hour by calling [phone].   DIET: Your first meal following the procedure should be a small meal and then it is ok to progress to your normal diet. Heavy or fried foods are harder to digest and may make you feel nauseous or bloated.  Likewise, meals heavy in dairy and vegetables can increase bloating.  Drink plenty of fluids but you should avoid alcoholic beverages  for 24 hours.  ACTIVITY:  You should plan to take it easy for the rest of today and you should NOT DRIVE or use heavy machinery until tomorrow (because of the sedation medicines used during the test).    FOLLOW UP: Our staff will call the number listed on your records the next business day following your procedure to check on you and address any questions or concerns that you may have regarding the information given to you following your procedure. If we do not reach you, we will leave a message.  However, if you are feeling well and you are not experiencing any problems, there is no need to return our call.  We will assume that you have returned to your regular daily activities without incident.  If any biopsies were taken you will be contacted by phone or by letter within the next 1-3 weeks.  Please call us at [phone] if  you have not heard about the biopsies in 3 weeks.    SIGNATURES/CONFIDENTIALITY: You and/or your care partner have signed paperwork which will be entered into your electronic medical record.  These signatures attest to the fact that that the information above on your After Visit Summary has been reviewed and is understood.  Full responsibility of the confidentiality of this discharge information lies with you and/or your care-partner.

## 2014-10-25 NOTE — Op Note (Signed)
[place]  [address] [place], [address]   ENDOSCOPY PROCEDURE REPORT  PATIENT: Jessica Dennis, Jessica Dennis  MR#: [id number] BIRTHDATE: October 07, 1932 , 45  yrs. old GENDER: female ENDOSCOPIST: Gatha Mayer, MD, [facility] PROCEDURE DATE:  10/25/2014 PROCEDURE:  EGD, diagnostic ASA CLASS:     Class II INDICATIONS:  heme + and cirrhosis. MEDICATIONS: Propofol 100 mg IV and Monitored anesthesia care TOPICAL ANESTHETIC: none  DESCRIPTION OF PROCEDURE: After the risks benefits and alternatives of the procedure were thoroughly explained, informed consent was obtained.  The LB [id number] endoscope was introduced through the mouth and advanced to the second portion of the duodenum , Without limitations.  The instrument was slowly withdrawn as the mucosa was fully examined.   EXAM: The esophagus and gastroesophageal junction were completely normal in appearance.  The stomach was entered and closely examined.The antrum, angularis, and lesser curvature were well visualized, including a retroflexed view of the cardia and fundus. The stomach wall was normally distensable.  The scope passed easily through the pylorus into the duodenum.  Retroflexed views revealed no abnormalities.     The scope was then withdrawn from the patient and the procedure completed.  COMPLICATIONS: There were no immediate complications.  ENDOSCOPIC IMPRESSION: Normal appearing esophagus and GE junction, the stomach was well visualized and normal in appearance, normal appearing duodenum  RECOMMENDATIONS: Repeat EGD 3 years 2019      see me in office early 2017 - patient to call for appointment Colonoscopy next    eSigned:  Gatha Mayer, MD, [facility] 10/25/2014 11:23 AM    CC:The Patient and Dr. Garnet Koyanagi

## 2014-10-25 NOTE — Progress Notes (Signed)
Transferred to recovery room. A/O x3, pleased with MAC.  VSS.  Report to [name], RN. 

## 2014-10-26 ENCOUNTER — Telehealth: Payer: Self-pay | Admitting: *Deleted

## 2014-10-26 NOTE — Telephone Encounter (Signed)
  Follow up Call-  Call back number 10/25/2014  Post procedure Call Back phone  # [phone]  Permission to leave phone message Yes     Patient questions:  Do you have a fever, pain , or abdominal swelling? No. Pain Score  0 *  Have you tolerated food without any problems? Yes.    Have you been able to return to your normal activities? Yes.    Do you have any questions about your discharge instructions: Diet   No. Medications  No. Follow up visit  No.  Do you have questions or concerns about your Care? No.  Actions: * If pain score is 4 or above: No action needed, pain <4.

## 2014-10-30 ENCOUNTER — Encounter: Payer: Self-pay | Admitting: Internal Medicine

## 2014-10-30 NOTE — Progress Notes (Signed)
Quick Note:  distal hyperplastic polyp - not precancerous No recall ______

## 2014-11-02 ENCOUNTER — Encounter: Payer: Self-pay | Admitting: Family Medicine

## 2014-11-02 ENCOUNTER — Ambulatory Visit (INDEPENDENT_AMBULATORY_CARE_PROVIDER_SITE_OTHER): Payer: Medicare Other | Admitting: Family Medicine

## 2014-11-02 VITALS — BP 132/74 | HR 84 | Temp 97.9°F | Wt 169.0 lb

## 2014-11-02 DIAGNOSIS — E785 Hyperlipidemia, unspecified: Secondary | ICD-10-CM

## 2014-11-02 DIAGNOSIS — T148XXA Other injury of unspecified body region, initial encounter: Secondary | ICD-10-CM

## 2014-11-02 DIAGNOSIS — E039 Hypothyroidism, unspecified: Secondary | ICD-10-CM | POA: Diagnosis not present

## 2014-11-02 DIAGNOSIS — E118 Type 2 diabetes mellitus with unspecified complications: Secondary | ICD-10-CM | POA: Diagnosis not present

## 2014-11-02 DIAGNOSIS — I10 Essential (primary) hypertension: Secondary | ICD-10-CM | POA: Diagnosis not present

## 2014-11-02 DIAGNOSIS — T148 Other injury of unspecified body region: Secondary | ICD-10-CM

## 2014-11-02 DIAGNOSIS — L089 Local infection of the skin and subcutaneous tissue, unspecified: Secondary | ICD-10-CM

## 2014-11-02 LAB — COMPREHENSIVE METABOLIC PANEL
ALT: 18 U/L (ref 0–35)
AST: 27 U/L (ref 0–37)
Albumin: 4 g/dL (ref 3.5–5.2)
Alkaline Phosphatase: 85 U/L (ref 39–117)
BUN: 15 mg/dL (ref 6–23)
CO2: 27 mEq/L (ref 19–32)
Calcium: 9.4 mg/dL (ref 8.4–10.5)
Chloride: 103 mEq/L (ref 96–112)
Creatinine, Ser: 0.65 mg/dL (ref 0.40–1.20)
GFR: 93.25 mL/min (ref >60.00)
Glucose, Bld: 206 mg/dL — ABNORMAL HIGH (ref 70–99)
Potassium: 3.8 mEq/L (ref 3.5–5.1)
Sodium: 139 mEq/L (ref 135–145)
Total Bilirubin: 0.8 mg/dL (ref 0.2–1.2)
Total Protein: 6.8 g/dL (ref 6.0–8.3)

## 2014-11-02 LAB — POCT URINALYSIS DIPSTICK
Bilirubin, UA: NEGATIVE
Blood, UA: NEGATIVE
Glucose, UA: NEGATIVE
Ketones, UA: NEGATIVE
Leukocytes, UA: NEGATIVE
Nitrite, UA: NEGATIVE
Protein, UA: NEGATIVE
Spec Grav, UA: 1.015
Urobilinogen, UA: 0.2
pH, UA: 6

## 2014-11-02 LAB — LIPID PANEL
Cholesterol: 177 mg/dL (ref 0–200)
HDL: 60.1 mg/dL (ref >39.00)
LDL Cholesterol: 105 mg/dL — ABNORMAL HIGH (ref 0–99)
NonHDL: 116.57
Total CHOL/HDL Ratio: 3
Triglycerides: 56 mg/dL (ref 0.0–149.0)
VLDL: 11.2 mg/dL (ref 0.0–40.0)

## 2014-11-02 LAB — HEMOGLOBIN A1C: Hgb A1c MFr Bld: 6.5 % (ref 4.6–6.5)

## 2014-11-02 LAB — TSH: TSH: 2.08 u[IU]/mL (ref 0.35–4.50)

## 2014-11-02 MED ORDER — QUINAPRIL HCL 40 MG PO TABS: 40.0000 mg | ORAL_TABLET | Freq: Every day | ORAL | Status: DC

## 2014-11-02 MED ORDER — DOXYCYCLINE HYCLATE 100 MG PO TABS: 100.0000 mg | ORAL_TABLET | Freq: Two times a day (BID) | ORAL | Status: DC

## 2014-11-02 NOTE — Progress Notes (Signed)
Patient ID: Jessica Dennis, female    DOB: 10-07-1932  Age: 81 y.o. MRN: [id number]    Subjective:  Subjective HPI Jessica Dennis presents for f/u dm and htn.  She also has an infected wound on her R low leg.  She was picking at her skin.    HPI HYPERTENSION  Blood pressure range-stable  Chest pain- no      Dyspnea- no Lightheadedness- no   Edema- no Other side effects - no   Medication compliance: good Low salt diet- yes  DIABETES  Blood Sugar ranges-170-200  Polyuria- no New Visual problems- no Hypoglycemic symptoms- no Other side effects-no Medication compliance - good Last eye exam- due Foot exam- today    Review of Systems  Constitutional: Negative for diaphoresis, appetite change, fatigue and unexpected weight change.  Eyes: Negative for pain, redness and visual disturbance.  Respiratory: Negative for cough, chest tightness, shortness of breath and wheezing.   Cardiovascular: Negative for chest pain, palpitations and leg swelling.  Endocrine: Negative for cold intolerance, heat intolerance, polydipsia, polyphagia and polyuria.  Genitourinary: Negative for dysuria, frequency and difficulty urinating.  Neurological: Negative for dizziness, light-headedness, numbness and headaches.  All other systems reviewed and are negative.   History Past Medical History  Diagnosis Date  . Anemia   . Anxiety   . Arthritis   . Hypertension   . Thyroid disease   . Borderline diabetes   . Diabetes mellitus without complication ([place])   . Cancer of the skin, basal cell 09/03/2012  . Cirrhosis ([place])   . GERD (gastroesophageal reflux disease)   . Diverticular disease   . Angiodysplasia of ascending colon 10/25/2014    She has past surgical history that includes Abdominal hysterectomy; Total hip arthroplasty (Bilateral, 1993, 2006); Appendectomy; Tonsillectomy; and Colonoscopy.   Her family history includes Bladder Cancer in her father; Diabetes in her mother; Heart  attack in her father.She reports that she has never smoked. She has never used smokeless tobacco. She reports that she drinks about 1.2 oz of alcohol per week. She reports that she does not use illicit drugs.  Current Outpatient Prescriptions on File Prior to Visit  Medication Sig Dispense Refill  . Ascorbic Acid (VITAMIN C) 1000 MG tablet Take 1,000 mg by mouth daily.    . Calcium Carbonate-Vitamin D (CALCIUM + D PO) Take by mouth 2 (two) times daily.    . furosemide (LASIX) 20 MG tablet Take 1 tablet (20 mg total) by mouth daily. 90 tablet 1  . glucose blood (ACCU-CHEK AVIVA PLUS) test strip 1 each by Other route as needed. 100 each 11  . Lancets (ACCU-CHEK SOFT TOUCH) lancets Use as instructed 100 each 12  . latanoprost (XALATAN) 0.005 % ophthalmic solution Place 1 drop into both eyes at bedtime.     Marland Kitchen levothyroxine (SYNTHROID, LEVOTHROID) 75 MCG tablet TAKE 1 TABLET (75 MCG TOTAL) BY MOUTH DAILY. 90 tablet 1  . LORazepam (ATIVAN) 0.5 MG tablet TAKE 1 TABLET BY MOUTH DAILY AS NEEDED 90 tablet 0  . meloxicam (MOBIC) 7.5 MG tablet Take 1 tablet (7.5 mg total) by mouth 2 (two) times daily. 180 tablet 1  . metFORMIN (GLUCOPHAGE-XR) 500 MG 24 hr tablet Take 1 tablet (500 mg total) by mouth 2 (two) times daily. 180 tablet 1  . Omega-3 Fatty Acids (FISH OIL) 1000 MG CAPS Take by mouth every morning.    . pantoprazole (PROTONIX) 40 MG tablet Take 1 tablet (40 mg total) by mouth daily. [place]  tablet 1  . potassium chloride SA (K-DUR,KLOR-CON) 20 MEQ tablet Take 1 tablet (20 mEq total) by mouth daily. 90 tablet 1  . Prednicarbate 0.1 % CREA Apply topically 2 (two) times daily.      No current facility-administered medications on file prior to visit.     Objective:  Objective Physical Exam  Constitutional: She is oriented to person, place, and time. She appears well-developed and well-nourished.  HENT:  Head: Normocephalic and atraumatic.  Eyes: Conjunctivae and EOM are normal.  Neck: Normal range  of motion. Neck supple. No JVD present. Carotid bruit is not present. No thyromegaly present.  Cardiovascular: Normal rate, regular rhythm and normal heart sounds.   No murmur heard. Pulmonary/Chest: Effort normal and breath sounds normal. No respiratory distress. She has no wheezes. She has no rales. She exhibits no tenderness.  Musculoskeletal: She exhibits no edema.  Neurological: She is alert and oriented to person, place, and time.  Skin: Lesion noted.     Psychiatric: She has a normal mood and affect. Her behavior is normal.  Vitals reviewed. Sensory exam of the foot is normal, tested with the monofilament. Good pulses, no lesions or ulcers, good peripheral pulses.  BP 132/74 mmHg  Pulse 84  Temp(Src) 97.9 F (36.6 C) (Oral)  Wt 169 lb (76.658 kg)  SpO2 97% Wt Readings from Last 3 Encounters:  11/02/14 169 lb (76.658 kg)  10/25/14 167 lb (75.751 kg)  08/20/14 167 lb 3.2 oz (75.841 kg)     Lab Results  Component Value Date   WBC 4.8 07/19/2014   HGB 13.0 07/19/2014   HCT 37.0 07/19/2014   PLT 64* 07/19/2014   GLUCOSE 158* 08/10/2014   CHOL 173 05/11/2014   TRIG 92 05/11/2014   HDL 65 05/11/2014   LDLCALC 90 05/11/2014   ALT 27 08/10/2014   AST 35 08/10/2014   NA 139 08/10/2014   K 3.7 08/10/2014   CL 102 08/10/2014   CREATININE 0.68 08/10/2014   BUN 15 08/10/2014   CO2 29 08/10/2014   TSH 1.687 05/11/2014   INR 1.3* 08/10/2014   HGBA1C 5.6 05/11/2014   MICROALBUR 0.78 03/23/2013    No results found.   Assessment & Plan:  Plan I have discontinued Jessica Dennis's celecoxib. I am also having her start on doxycycline. Additionally, I am having her maintain her Prednicarbate, latanoprost, Fish Oil, Calcium Carbonate-Vitamin D (CALCIUM + D PO), vitamin C, glucose blood, accu-chek soft touch, potassium chloride SA, furosemide, LORazepam, pantoprazole, meloxicam, levothyroxine, metFORMIN, and quinapril.  Meds ordered this encounter  Medications  . quinapril  (ACCUPRIL) 40 MG tablet    Sig: Take 1 tablet (40 mg total) by mouth daily.    Dispense:  90 tablet    Refill:  1    DISCONTINUE ALL PREVIOUS REFILLS FOR THIS MEDICATION  . doxycycline (VIBRA-TABS) 100 MG tablet    Sig: Take 1 tablet (100 mg total) by mouth 2 (two) times daily.    Dispense:  20 tablet    Refill:  0    Problem List Items Addressed This Visit    None    Visit Diagnoses    Essential hypertension    -  Primary    Relevant Medications    quinapril (ACCUPRIL) 40 MG tablet    Other Relevant Orders    Comp Met (CMET)    Hyperlipidemia        Relevant Medications    quinapril (ACCUPRIL) 40 MG tablet    Other Relevant Orders  Comp Met (CMET)    Lipid panel    Type 2 diabetes mellitus with complication, without long-term current use of insulin (HCC)        Relevant Medications    quinapril (ACCUPRIL) 40 MG tablet    Other Relevant Orders    Comp Met (CMET)    Hemoglobin A1c    POCT urinalysis dipstick (Completed)    Hypothyroidism, unspecified hypothyroidism type        Relevant Orders    TSH    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  Garnet Koyanagi, DO

## 2014-11-02 NOTE — Progress Notes (Signed)
Pre visit review using our clinic review tool, if applicable. No additional management support is needed unless otherwise documented below in the visit note. 

## 2014-11-02 NOTE — Patient Instructions (Signed)

## 2014-11-30 ENCOUNTER — Telehealth: Payer: Self-pay | Admitting: Family Medicine

## 2014-11-30 DIAGNOSIS — F4322 Adjustment disorder with anxiety: Secondary | ICD-10-CM

## 2014-11-30 MED ORDER — LORAZEPAM 0.5 MG PO TABS: ORAL_TABLET | ORAL | Status: DC

## 2014-11-30 NOTE — Telephone Encounter (Signed)
Last seen 11/02/14 and filled 08/20/14 #90  Please advise     KP

## 2014-11-30 NOTE — Telephone Encounter (Signed)
°  Relation to GJ:FTNB Call back number:[phone] Pharmacy:[facility]  Reason for call: pt is needing rx LORazepam (ATIVAN) 0.5 MG tablet  Pt would like to know if she can get a 90 day supply states she is leaving for [place] on 12/30/14 and will not be here to get her rx for the month of dec.

## 2014-11-30 NOTE — Telephone Encounter (Signed)
Rx faxed.    KP 

## 2014-11-30 NOTE — Telephone Encounter (Signed)
[facility] for #90  No refills

## 2014-12-03 ENCOUNTER — Ambulatory Visit: Payer: Medicare Other | Admitting: Family Medicine

## 2014-12-11 HISTORY — PX: LEG SKIN LESION  BIOPSY / EXCISION: SUR473

## 2014-12-27 ENCOUNTER — Telehealth: Payer: Self-pay | Admitting: Family Medicine

## 2014-12-27 MED ORDER — SCOPOLAMINE 1 MG/3DAYS TD PT72: 1.0000 | MEDICATED_PATCH | TRANSDERMAL | Status: DC

## 2014-12-27 NOTE — Telephone Encounter (Signed)
Ok for scopolamine patch- apply 1 patch every 72 hrs, disp 1 box (4 patches- unless her trip is longer than 12 days and then 2 boxes)

## 2014-12-27 NOTE — Telephone Encounter (Signed)
Caller name:Jevon Relationship to patient:self Can be [phone] Pharmacy:[facility]   Reason for call:She needs an rx for patches for seasick she is going on her trip on Thursday.  She needs to pick up Tomorrow

## 2014-12-27 NOTE — Telephone Encounter (Signed)
Could not reach pt by phone, hung up on twice. I filled medication #8 with 0 just in case her trip was longer.

## 2015-01-18 ENCOUNTER — Other Ambulatory Visit: Payer: Self-pay | Admitting: Family Medicine

## 2015-01-19 ENCOUNTER — Ambulatory Visit (HOSPITAL_BASED_OUTPATIENT_CLINIC_OR_DEPARTMENT_OTHER): Payer: Medicare Other | Admitting: Hematology & Oncology

## 2015-01-19 ENCOUNTER — Encounter: Payer: Self-pay | Admitting: Hematology & Oncology

## 2015-01-19 ENCOUNTER — Other Ambulatory Visit (HOSPITAL_BASED_OUTPATIENT_CLINIC_OR_DEPARTMENT_OTHER): Payer: Medicare Other

## 2015-01-19 VITALS — BP 148/63 | HR 88 | Temp 97.8°F | Resp 16 | Ht 66.0 in | Wt 169.0 lb

## 2015-01-19 DIAGNOSIS — R195 Other fecal abnormalities: Secondary | ICD-10-CM | POA: Insufficient documentation

## 2015-01-19 DIAGNOSIS — D500 Iron deficiency anemia secondary to blood loss (chronic): Secondary | ICD-10-CM | POA: Diagnosis not present

## 2015-01-19 DIAGNOSIS — D696 Thrombocytopenia, unspecified: Secondary | ICD-10-CM

## 2015-01-19 DIAGNOSIS — I864 Gastric varices: Secondary | ICD-10-CM

## 2015-01-19 DIAGNOSIS — C4492 Squamous cell carcinoma of skin, unspecified: Secondary | ICD-10-CM

## 2015-01-19 HISTORY — DX: Iron deficiency anemia secondary to blood loss (chronic): D50.0

## 2015-01-19 LAB — CBC WITH DIFFERENTIAL (CANCER CENTER ONLY)
BASO#: 0 10*3/uL (ref 0.0–0.2)
BASO%: 0.2 % (ref 0.0–2.0)
EOS%: 4.6 % (ref 0.0–7.0)
Eosinophils Absolute: 0.2 10*3/uL (ref 0.0–0.5)
HCT: 28.4 % — ABNORMAL LOW (ref 34.8–46.6)
HGB: 8.3 g/dL — ABNORMAL LOW (ref 11.6–15.9)
LYMPH#: 0.8 10*3/uL — ABNORMAL LOW (ref 0.9–3.3)
LYMPH%: 18.5 % (ref 14.0–48.0)
MCH: 22.9 pg — ABNORMAL LOW (ref 26.0–34.0)
MCHC: 29.2 g/dL — ABNORMAL LOW (ref 32.0–36.0)
MCV: 79 fL — ABNORMAL LOW (ref 81–101)
MONO#: 0.4 10*3/uL (ref 0.1–0.9)
MONO%: 10.2 % (ref 0.0–13.0)
NEUT#: 2.7 10*3/uL (ref 1.5–6.5)
NEUT%: 66.5 % (ref 39.6–80.0)
Platelets: 74 10*3/uL — ABNORMAL LOW (ref 145–400)
RBC: 3.62 10*6/uL — ABNORMAL LOW (ref 3.70–5.32)
RDW: 16.7 % — ABNORMAL HIGH (ref 11.1–15.7)
WBC: 4.1 10*3/uL (ref 3.9–10.0)

## 2015-01-19 LAB — CHCC SATELLITE - SMEAR

## 2015-01-19 NOTE — Progress Notes (Signed)
Principle Diagnosis:   Chronic thrombocytopenia secondary to hepato-steatosis and splenomegaly  GI bleeding with iron deficiency  Current Therapy:    IV iron as indicated     Interim History:  Jessica Dennis is back for follow-up. We last saw her 6 months ago. She's been feeling pretty well. She really has had no specific complaints.  She was recently over in [place] on a river cruise. She had a nice time.  She did undergo a colonoscopy by Dr. Carlean Purl back in October. I think a hyperplastic polyp was removed.  She's not had any abdominal pain. She's had no problem with weight loss or weight gain. She's had problems with a skin lesion on the right lower leg. She's had Mohs surgery. His legs she had a squamous cell carcinoma.  She's not noted any hematochezia. She's had no melena. She's had no nausea or vomiting. She's had no issues with her blood sugars.  She does have diverticulosis. Again she's not noted any blood.  Medications:  Current outpatient prescriptions:  .  Ascorbic Acid (VITAMIN C) 1000 MG tablet, Take 1,000 mg by mouth daily., Disp: , Rfl:  .  Calcium Carbonate-Vitamin D (CALCIUM + D PO), Take by mouth 2 (two) times daily., Disp: , Rfl:  .  doxycycline (VIBRA-TABS) 100 MG tablet, Take 1 tablet (100 mg total) by mouth 2 (two) times daily., Disp: 20 tablet, Rfl: 0 .  furosemide (LASIX) 20 MG tablet, Take 1 tablet (20 mg total) by mouth daily., Disp: 90 tablet, Rfl: 1 .  glucose blood (ACCU-CHEK AVIVA PLUS) test strip, 1 each by Other route as needed., Disp: 100 each, Rfl: 11 .  Lancets (ACCU-CHEK SOFT TOUCH) lancets, Use as instructed, Disp: 100 each, Rfl: 12 .  latanoprost (XALATAN) 0.005 % ophthalmic solution, Place 1 drop into both eyes at bedtime. , Disp: , Rfl:  .  levothyroxine (SYNTHROID, LEVOTHROID) 75 MCG tablet, Take 1 tablet by mouth  daily, Disp: 90 tablet, Rfl: 3 .  LORazepam (ATIVAN) 0.5 MG tablet, TAKE 1 TABLET BY MOUTH DAILY AS NEEDED, Disp: 90 tablet, Rfl: 0 .   meloxicam (MOBIC) 7.5 MG tablet, Take 1 tablet by mouth two  times daily, Disp: 180 tablet, Rfl: 0 .  metFORMIN (GLUCOPHAGE-XR) 500 MG 24 hr tablet, Take 1 tablet (500 mg total) by mouth 2 (two) times daily., Disp: 180 tablet, Rfl: 1 .  Omega-3 Fatty Acids (FISH OIL) 1000 MG CAPS, Take by mouth every morning., Disp: , Rfl:  .  pantoprazole (PROTONIX) 40 MG tablet, Take 1 tablet by mouth  daily, Disp: 90 tablet, Rfl: 3 .  potassium chloride SA (K-DUR,KLOR-CON) 20 MEQ tablet, Take 1 tablet (20 mEq total) by mouth daily., Disp: 90 tablet, Rfl: 1 .  Prednicarbate 0.1 % CREA, Apply topically 2 (two) times daily. , Disp: , Rfl:  .  quinapril (ACCUPRIL) 40 MG tablet, Take 1 tablet (40 mg total) by mouth daily., Disp: 90 tablet, Rfl: 1 .  scopolamine (TRANSDERM-SCOP) 1 MG/3DAYS, Place 1 patch (1.5 mg total) onto the skin every 3 (three) days., Disp: 8 patch, Rfl: 0  Allergies: No Known Allergies  Past Medical History, Surgical history, Social history, and Family History were reviewed and updated.  Review of Systems: As above  Physical Exam:  height is 5\' 6"  (1.676 m) and weight is 169 lb (76.658 kg). Her oral temperature is 97.8 F (36.6 C). Her blood pressure is 148/63 and her pulse is 88. Her respiration is 16.   Wt Readings from Last  3 Encounters:  01/19/15 169 lb (76.658 kg)  11/02/14 169 lb (76.658 kg)  10/25/14 167 lb (75.751 kg)     Head and neck exam shows no ocular or oral lesions. She has no adenopathy in the neck. Thyroid is nonpalpable. Lungs are clear. Cardiac exam regular rate and rhythm with no murmurs, rubs or bruits. Abdomen is soft. She has good bowel sounds. There is no fluid wave. Her spleen tip is palpable just at the left costal margin. There is no hepatomegaly. Back exam shows no tenderness over the spine, ribs or hips. Extremities shows no clubbing, cyanosis or edema. She has good range of motion of her joints. She has good strength. Skin exam shows no rashes, ecchymoses  or petechia. Neurological exam shows no focal neurological deficits. Rectal exam shows no mass in the rectal vault. Stool is brown but heme positive.  Lab Results  Component Value Date   WBC 4.1 01/19/2015   HGB 8.3* 01/19/2015   HCT 28.4* 01/19/2015   MCV 79* 01/19/2015   PLT 74* 01/19/2015     Chemistry      Component Value Date/Time   NA 139 11/02/2014 1102   K 3.8 11/02/2014 1102   CL 103 11/02/2014 1102   CO2 27 11/02/2014 1102   BUN 15 11/02/2014 1102   CREATININE 0.65 11/02/2014 1102   CREATININE 0.65 05/11/2014 1045      Component Value Date/Time   CALCIUM 9.4 11/02/2014 1102   ALKPHOS 85 11/02/2014 1102   AST 27 11/02/2014 1102   ALT 18 11/02/2014 1102   BILITOT 0.8 11/02/2014 1102         Impression and Plan: Jessica Dennis is a 81 year old white female with thrombocytopenia. The main problem right now is that her hemoglobin has dropped. Since we saw her 6 months ago, her hemoglobin is dropped 5 points. Her stool is heme-positive. I suspected that she has chronic GI bleeding. She has cirrhosis. She has some splenomegaly. She probably has some variceal bleeding. She may have some diverticulosis bleeding.  She needs IV iron. She does not need a blood transfusion right now. Given the fact that she has very limited symptoms, I would have to believe that this GI blood loss has occurred over several months.  She takes a proton pump inhibitor so I spent that any iron that she takes orally probably he will not be absorbed all that well.  I will be giveing her iron next week. I told her that if her blood does not improve, and she ultimately will end up in the hospital probably with heart failure. She knows that she has to watch out for shortness of breath, chest wall pain, blood in the stool. Black stool also I think would be an indicator that she has to be admitted.  I was not ready for this change in her lab work. As such, this was much more complicated.  I spent about 45  minutes with her.   Volanda Napoleon, MD 12/28/20161:09 PM

## 2015-01-26 ENCOUNTER — Ambulatory Visit: Payer: Medicare Other

## 2015-01-31 ENCOUNTER — Ambulatory Visit (HOSPITAL_BASED_OUTPATIENT_CLINIC_OR_DEPARTMENT_OTHER): Payer: PPO

## 2015-01-31 VITALS — BP 158/64 | HR 83

## 2015-01-31 DIAGNOSIS — D500 Iron deficiency anemia secondary to blood loss (chronic): Secondary | ICD-10-CM | POA: Diagnosis not present

## 2015-01-31 DIAGNOSIS — I864 Gastric varices: Secondary | ICD-10-CM

## 2015-01-31 DIAGNOSIS — R195 Other fecal abnormalities: Secondary | ICD-10-CM

## 2015-01-31 MED ORDER — SODIUM CHLORIDE 0.9 % IV SOLN
Freq: Once | INTRAVENOUS | Status: AC
  Administered 2015-01-31: 15:00:00 via INTRAVENOUS

## 2015-01-31 MED ORDER — SODIUM CHLORIDE 0.9 % IV SOLN
510.0000 mg | Freq: Once | INTRAVENOUS | Status: AC
  Administered 2015-01-31: 510 mg via INTRAVENOUS
  Filled 2015-01-31: qty 17

## 2015-01-31 NOTE — Patient Instructions (Signed)

## 2015-02-11 DIAGNOSIS — M25511 Pain in right shoulder: Secondary | ICD-10-CM | POA: Diagnosis not present

## 2015-02-11 DIAGNOSIS — M25561 Pain in right knee: Secondary | ICD-10-CM | POA: Diagnosis not present

## 2015-02-11 DIAGNOSIS — M7541 Impingement syndrome of right shoulder: Secondary | ICD-10-CM | POA: Diagnosis not present

## 2015-02-16 ENCOUNTER — Ambulatory Visit: Payer: Medicare Other | Admitting: Family

## 2015-02-16 ENCOUNTER — Other Ambulatory Visit: Payer: Medicare Other

## 2015-02-23 ENCOUNTER — Other Ambulatory Visit (HOSPITAL_BASED_OUTPATIENT_CLINIC_OR_DEPARTMENT_OTHER): Payer: PPO

## 2015-02-23 ENCOUNTER — Ambulatory Visit (HOSPITAL_BASED_OUTPATIENT_CLINIC_OR_DEPARTMENT_OTHER): Payer: PPO | Admitting: Family

## 2015-02-23 ENCOUNTER — Encounter: Payer: Self-pay | Admitting: Family

## 2015-02-23 VITALS — BP 138/57 | HR 93 | Temp 98.2°F | Resp 16 | Ht 66.0 in | Wt 167.0 lb

## 2015-02-23 DIAGNOSIS — D649 Anemia, unspecified: Secondary | ICD-10-CM | POA: Diagnosis not present

## 2015-02-23 DIAGNOSIS — R195 Other fecal abnormalities: Secondary | ICD-10-CM

## 2015-02-23 DIAGNOSIS — K760 Fatty (change of) liver, not elsewhere classified: Secondary | ICD-10-CM

## 2015-02-23 DIAGNOSIS — D6959 Other secondary thrombocytopenia: Secondary | ICD-10-CM

## 2015-02-23 DIAGNOSIS — I864 Gastric varices: Secondary | ICD-10-CM

## 2015-02-23 DIAGNOSIS — D500 Iron deficiency anemia secondary to blood loss (chronic): Secondary | ICD-10-CM

## 2015-02-23 DIAGNOSIS — C4492 Squamous cell carcinoma of skin, unspecified: Secondary | ICD-10-CM

## 2015-02-23 LAB — COMPREHENSIVE METABOLIC PANEL
ALT: 27 U/L (ref 0–55)
AST: 33 U/L (ref 5–34)
Albumin: 3.8 g/dL (ref 3.5–5.0)
Alkaline Phosphatase: 96 U/L (ref 40–150)
Anion Gap: 12 mEq/L — ABNORMAL HIGH (ref 3–11)
BUN: 21.5 mg/dL (ref 7.0–26.0)
CO2: 22 mEq/L (ref 22–29)
Calcium: 9.6 mg/dL (ref 8.4–10.4)
Chloride: 104 mEq/L (ref 98–109)
Creatinine: 0.8 mg/dL (ref 0.6–1.1)
EGFR: 66 mL/min/{1.73_m2} — ABNORMAL LOW (ref >90)
Glucose: 233 mg/dl — ABNORMAL HIGH (ref 70–140)
Potassium: 4.2 mEq/L (ref 3.5–5.1)
Sodium: 138 mEq/L (ref 136–145)
Total Bilirubin: 1.07 mg/dL (ref 0.20–1.20)
Total Protein: 6.6 g/dL (ref 6.4–8.3)

## 2015-02-23 LAB — CBC WITH DIFFERENTIAL (CANCER CENTER ONLY)
BASO#: 0 10*3/uL (ref 0.0–0.2)
BASO%: 0.7 % (ref 0.0–2.0)
EOS%: 3.5 % (ref 0.0–7.0)
Eosinophils Absolute: 0.2 10*3/uL (ref 0.0–0.5)
HCT: 31.2 % — ABNORMAL LOW (ref 34.8–46.6)
HGB: 9.2 g/dL — ABNORMAL LOW (ref 11.6–15.9)
LYMPH#: 0.8 10*3/uL — ABNORMAL LOW (ref 0.9–3.3)
LYMPH%: 13.7 % — ABNORMAL LOW (ref 14.0–48.0)
MCH: 24.7 pg — ABNORMAL LOW (ref 26.0–34.0)
MCHC: 29.5 g/dL — ABNORMAL LOW (ref 32.0–36.0)
MCV: 84 fL (ref 81–101)
MONO#: 0.5 10*3/uL (ref 0.1–0.9)
MONO%: 7.6 % (ref 0.0–13.0)
NEUT#: 4.5 10*3/uL (ref 1.5–6.5)
NEUT%: 74.5 % (ref 39.6–80.0)
Platelets: 79 10*3/uL — ABNORMAL LOW (ref 145–400)
RBC: 3.72 10*6/uL (ref 3.70–5.32)
RDW: 25.4 % — ABNORMAL HIGH (ref 11.1–15.7)
WBC: 6.1 10*3/uL (ref 3.9–10.0)

## 2015-02-23 LAB — IRON AND TIBC
%SAT: 24 % (ref 21–57)
Iron: 81 ug/dL (ref 41–142)
TIBC: 335 ug/dL (ref 236–444)
UIBC: 254 ug/dL (ref 120–384)

## 2015-02-23 LAB — FERRITIN: Ferritin: 34 ng/ml (ref 9–269)

## 2015-02-23 NOTE — Progress Notes (Signed)
Hematology and Oncology Follow Up Visit  Nichoel Coan [id number] 31-Oct-1932 82 y.o. 02/23/2015   Principle Diagnosis:  Chronic thrombocytopenia secondary to hepato-steatosis and splenomegaly Iron deficiency anemia secondary to GI bleed   Current Therapy:   Observation    Interim History:  Ms. Mince is here today for a follow-up. She is doing well but has some occasional fatigue. She denies having any episodes of bleeding. She continues to take her protonix daily. Her platelet count is up to 79 at this time. Her Hgb is now 9.2 with an MCV of 104.  She did trip with her dog and bump her face. She has a bruise under her right eye that is healing nicely. Thankfully she was not seriously injured.  No fever, chills, n/v, cough, rash, dizziness, headaches, blurred vision, SOB, chest pain, palpitations, abdominal pain or changes in bowel or bladder habits.  No lymphadenopathy found on exam.  No swelling, tenderness, numbness or tingling in her extremities. She has a skin graft the the back of her right leg where she had a skin cancer lesion removed. She states that the borders were clear. She has this bandaged and states that it is healing nicely.  Her blood sugars have been fairly well controlled 140-160. She is eating healthy and staying well hydrated. Her weight is stable.   Medications:    Medication List       This list is accurate as of: 02/23/15  4:25 PM.  Always use your most recent med list.               accu-chek soft touch lancets  Use as instructed     CALCIUM + D PO  Take by mouth 2 (two) times daily.     doxycycline 100 MG tablet  Commonly known as:  VIBRA-TABS  Take 1 tablet (100 mg total) by mouth 2 (two) times daily.     Fish Oil 1000 MG Caps  Take by mouth every morning.     furosemide 20 MG tablet  Commonly known as:  LASIX  Take 1 tablet (20 mg total) by mouth daily.     glucose blood test strip  Commonly known as:  ACCU-CHEK AVIVA PLUS  1 each by  Other route as needed.     latanoprost 0.005 % ophthalmic solution  Commonly known as:  XALATAN  Place 1 drop into both eyes at bedtime.     levothyroxine 75 MCG tablet  Commonly known as:  SYNTHROID, LEVOTHROID  Take 1 tablet by mouth  daily     LORazepam 0.5 MG tablet  Commonly known as:  ATIVAN  TAKE 1 TABLET BY MOUTH DAILY AS NEEDED     meloxicam 7.5 MG tablet  Commonly known as:  MOBIC  Take 1 tablet by mouth two  times daily     metFORMIN 500 MG 24 hr tablet  Commonly known as:  GLUCOPHAGE-XR  Take 1 tablet (500 mg total) by mouth 2 (two) times daily.     pantoprazole 40 MG tablet  Commonly known as:  PROTONIX  Take 1 tablet by mouth  daily     potassium chloride SA 20 MEQ tablet  Commonly known as:  K-DUR,KLOR-CON  Take 1 tablet (20 mEq total) by mouth daily.     Prednicarbate 0.1 % Crea  Apply topically 2 (two) times daily.     quinapril 40 MG tablet  Commonly known as:  ACCUPRIL  Take 1 tablet (40 mg total) by mouth daily.  vitamin C 1000 MG tablet  Take 1,000 mg by mouth daily.        Allergies: No Known Allergies  Past Medical History, Surgical history, Social history, and Family History were reviewed and updated.  Review of Systems: All other 10 point review of systems is negative.   Physical Exam:  height is 5\' 6"  (1.676 m) and weight is 167 lb (75.751 kg). Her oral temperature is 98.2 F (36.8 C). Her blood pressure is 138/57 and her pulse is 93. Her respiration is 16.   Wt Readings from Last 3 Encounters:  02/23/15 167 lb (75.751 kg)  01/19/15 169 lb (76.658 kg)  11/02/14 169 lb (76.658 kg)    Ocular: Sclerae unicteric, pupils equal, round and reactive to light Ear-nose-throat: Oropharynx clear, dentition fair Lymphatic: No cervical supraclavicular or axillary adenopathy  Lungs no rales or rhonchi, good excursion bilaterally Heart regular rate and rhythm, no murmur appreciated Abd soft, nontender, positive bowel sounds, no liver or  spleen tip palpated on exam MSK no focal spinal tenderness, no joint edema Neuro: non-focal, well-oriented, appropriate affect Breasts: Deferred  Lab Results  Component Value Date   WBC 6.1 02/23/2015   HGB 9.2* 02/23/2015   HCT 31.2* 02/23/2015   MCV 84 02/23/2015   PLT 79* 02/23/2015   Lab Results  Component Value Date   FERRITIN 34 02/23/2015   IRON 81 02/23/2015   TIBC 335 02/23/2015   UIBC 254 02/23/2015   IRONPCTSAT 24 02/23/2015   Lab Results  Component Value Date   RBC 3.72 02/23/2015   No results found for: KPAFRELGTCHN, LAMBDASER, KAPLAMBRATIO No results found for: IGGSERUM, IGA, IGMSERUM No results found for: Odetta Pink, SPEI   Chemistry      Component Value Date/Time   NA 138 02/23/2015 1342   NA 139 11/02/2014 1102   K 4.2 02/23/2015 1342   K 3.8 11/02/2014 1102   CL 103 11/02/2014 1102   CO2 22 02/23/2015 1342   CO2 27 11/02/2014 1102   BUN 21.5 02/23/2015 1342   BUN 15 11/02/2014 1102   CREATININE 0.8 02/23/2015 1342   CREATININE 0.65 11/02/2014 1102   CREATININE 0.65 05/11/2014 1045      Component Value Date/Time   CALCIUM 9.6 02/23/2015 1342   CALCIUM 9.4 11/02/2014 1102   ALKPHOS 96 02/23/2015 1342   ALKPHOS 85 11/02/2014 1102   AST 33 02/23/2015 1342   AST 27 11/02/2014 1102   ALT 27 02/23/2015 1342   ALT 18 11/02/2014 1102   BILITOT 1.07 02/23/2015 1342   BILITOT 0.8 11/02/2014 1102     Impression and Plan: Mrs. Common is a very charming 82 year old white female with chronic thrombocytopenia secondary to hepatic steatosis and splenomegaly. She has also had irondeficiency anemia due to GI bleed.  Her platelet count is 79 and she has had no issues with bleeding or bruising. Her Hgb is holding at 9.2.  We will see what her iron studies and erythropoietin level show.  If she needs Aranesp or Feraheme we will bring her in later this week.  We will plan to see her back in 1 month for labs  and follow-up.  She knows to contact us with any questions or concerns. We can certainly see her sooner if need be.   Eliezer Bottom, NP 2/1/20174:25 PM

## 2015-02-24 ENCOUNTER — Ambulatory Visit: Payer: PPO

## 2015-02-24 ENCOUNTER — Ambulatory Visit
Admission: RE | Admit: 2015-02-24 | Discharge: 2015-02-24 | Disposition: A | Discharge status: Home / Self Care | Payer: PPO | Source: Ambulatory Visit | Attending: Radiation Oncology | Admitting: Radiation Oncology

## 2015-02-24 DIAGNOSIS — C44722 Squamous cell carcinoma of skin of right lower limb, including hip: Secondary | ICD-10-CM | POA: Insufficient documentation

## 2015-02-24 DIAGNOSIS — Z872 Personal history of diseases of the skin and subcutaneous tissue: Secondary | ICD-10-CM | POA: Insufficient documentation

## 2015-02-24 LAB — ERYTHROPOIETIN: Erythropoietin: 74.1 m[IU]/mL — ABNORMAL HIGH (ref 2.6–18.5)

## 2015-02-28 ENCOUNTER — Telehealth: Payer: Self-pay | Admitting: *Deleted

## 2015-02-28 NOTE — Telephone Encounter (Signed)
Follow-up call to see how Jessica Dennis was feeling today, "stated she started feeling better yesterday afternoon."  I am glad you called when can I come in to see Dr. Pablo Ledger.  [name] made an appointment for Wednesday,  03-09-15 at 3:30 p.m.

## 2015-03-02 ENCOUNTER — Telehealth: Payer: Self-pay | Admitting: Family Medicine

## 2015-03-02 NOTE — Telephone Encounter (Signed)
Caller name: Shree Relation to pt: self Call back number: [phone] Pharmacy: [facility]  Reason for call: Pt came in office requesting rx for furosemide (LASIX) 20 MG tablet and for LORazepam (ATIVAN) 0.5 MG tablet. Please advise.

## 2015-03-03 ENCOUNTER — Other Ambulatory Visit: Payer: Self-pay | Admitting: Family Medicine

## 2015-03-03 NOTE — Telephone Encounter (Signed)
Rx faxed to [facility].      KP

## 2015-03-03 NOTE — Telephone Encounter (Signed)
Refill x1 

## 2015-03-03 NOTE — Telephone Encounter (Signed)
Last seen 11/02/14 and Ativan filled 11/30/14 #90  Please advise     KP

## 2015-03-03 NOTE — Telephone Encounter (Signed)
Last seen 11/02/14 and filled 11/30/14 #90  Please advise     KP

## 2015-03-09 ENCOUNTER — Ambulatory Visit
Admission: RE | Admit: 2015-03-09 | Discharge: 2015-03-09 | Disposition: A | Discharge status: Home / Self Care | Payer: PPO | Source: Ambulatory Visit | Attending: Radiation Oncology | Admitting: Radiation Oncology

## 2015-03-09 ENCOUNTER — Encounter: Payer: Self-pay | Admitting: Radiation Oncology

## 2015-03-09 VITALS — BP 138/76 | HR 117 | Temp 98.0°F | Ht 66.0 in | Wt 165.9 lb

## 2015-03-09 DIAGNOSIS — Z872 Personal history of diseases of the skin and subcutaneous tissue: Secondary | ICD-10-CM | POA: Diagnosis not present

## 2015-03-09 DIAGNOSIS — C4491 Basal cell carcinoma of skin, unspecified: Secondary | ICD-10-CM | POA: Diagnosis not present

## 2015-03-09 DIAGNOSIS — C44722 Squamous cell carcinoma of skin of right lower limb, including hip: Secondary | ICD-10-CM | POA: Diagnosis not present

## 2015-03-09 NOTE — Progress Notes (Signed)
  Radiation Oncology         [phone]) [phone] ________________________________  Initial Outpatient Consultation - Date: 03/09/2015   Name: Tejah Fortini MRN: [id number]   DOB: 1932/06/10  REFERRING PHYSICIAN: Rosalita Chessman, DO  DIAGNOSIS AND STAGE: Squamous cell carcinoma of the right lower leg (Staging incomplete)   HISTORY OF PRESENT ILLNESS::Libbey Tweten is a 82 y.o. female who presented to the clinic she had a spot on her leg that she kept picking and scabbing. When it started to hurt she went to her general practitioner. She had cellulitis before and thought this was the same thing again. Her physician gave her antibiotics which did not resolve the problem. She went back to her physician when it didn't get better and she underwent MOHS surgery on 12/08/2014. 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 Margins were not discussed and neither was the size. She went back a week later and the surgeon removed the margins until the margins were negative. She was referred to me by Dr. Danielle Dess for consideration of radiation and the management of her disease.   She is very active with many social events, travel and walking her dog.  She is currently only leaving her wound uncovered for a few mintues in the mroning and then spends the majority of her day on her feet and "going."  She is a diabetic.  She follows with Dr. Marin Olp for her anemia.  PREVIOUS RADIATION THERAPY: No  Past medical, social and family history were reviewed in the electronic chart. Review of symptoms was reviewed in the electronic chart. Medications were reviewed in the electronic chart.   PHYSICAL EXAM:  Filed Vitals:   03/09/15 1543  BP: 138/76  Pulse: 117  Temp: 98 F (36.7 C)  .165 lb 14.4 oz (75.252 kg). She has a 10 x 5 cm wound in the lateral aspect of her right left. This includes about a 3 x 3 cm ulcerated open area that appears clean. She has poor dorsalis pedis. She is alert and  oriented x 3.  IMPRESSION: Ms. Pizzoferrato is an 82 yo female with a squamous cell carcinoma of the right lower leg with unknown size or depth but with high risk features of perineural invasion.  PLAN: I need to talk to Dr. Danielle Dess regarding when to start treatment as well as review her pathology report. She still has a large area of healing tissue although she and her daughter in law say this is better.  I think she would benefit from radiation due to her perinueral invasion. We discussed 5-6 weeks of treatment as an outpatient. I would like her to be fully healed before we proceed given that she is a diabetic and will likely have wound healing problems in the future after radiation. I will give her a call tomorrow after discussing with Dr. Danielle Dess.   She has a follow up appointment with Dr. Danielle Dess next week.  I spent 40 minutes  face to face with the patient and more than 50% of that time was spent in counseling and/or coordination of care.   ------------------------------------------------  Thea Silversmith, MD    This document serves as a record of services personally performed by Thea Silversmith, MD. It was created on her behalf by  Lendon Collar, a trained medical scribe. The creation of this record is based on the scribe's personal observations and the provider's statements to them. This document has been checked and approved by the attending provider.

## 2015-03-09 NOTE — Progress Notes (Signed)
Histology and Location of Primary Skin Cancer Right Lateral Achillies    12-08-14  Jessica Dennis presented with skin lesion 6 months ago to right lower leg  Past/Anticipated interventions by patient's surgeon/dermatologist for current problematic lesion, if any: Xeroform dressing with unna boot,vaseline and telfa dressing to donor site apply past cleasning,pt. to cleanse wound with antibacterial soap and water.  Past skin cancers, if any:  1) Location/Histology/Intervention: Right lower leg Basal cell  2) Location/Histology/Intervention: Left lower leg Basal cell  3) Location/Histology/Intervention: Nose does not know what kind was frozen off in the doctor's office  History of Blistering sunburns, if any: No  SAFETY ISSUES:  Prior radiation? No  Pacemaker/ICD? No  Possible current pregnancy? No  Is the patient on methotrexate? No Pain:No Current Complaints / other details:  BP 138/76 mmHg  Pulse 117  Temp(Src) 98 F (36.7 C) (Oral)  Ht 5\' 6"  (1.676 m)  Wt 165 lb 14.4 oz (75.252 kg)  BMI 26.79 kg/m2  SpO2 98%

## 2015-03-11 NOTE — Addendum Note (Signed)
Encounter addended by: Malena Edman, RN on: 03/11/2015 11:08 AM<BR>     Documentation filed: Charges VN

## 2015-03-15 DIAGNOSIS — C44729 Squamous cell carcinoma of skin of left lower limb, including hip: Secondary | ICD-10-CM | POA: Diagnosis not present

## 2015-03-15 DIAGNOSIS — Z08 Encounter for follow-up examination after completed treatment for malignant neoplasm: Secondary | ICD-10-CM | POA: Diagnosis not present

## 2015-03-15 DIAGNOSIS — Z85828 Personal history of other malignant neoplasm of skin: Secondary | ICD-10-CM | POA: Diagnosis not present

## 2015-03-17 ENCOUNTER — Other Ambulatory Visit: Payer: Self-pay | Admitting: Family Medicine

## 2015-03-17 NOTE — Telephone Encounter (Signed)
Last seen 11/02/14 and filled 03/03/15 #90. Too soon to refill.     KP

## 2015-03-21 ENCOUNTER — Other Ambulatory Visit: Payer: Self-pay

## 2015-03-21 MED ORDER — PANTOPRAZOLE SODIUM 40 MG PO TBEC: DELAYED_RELEASE_TABLET | ORAL | Status: DC

## 2015-03-21 NOTE — Telephone Encounter (Signed)
Rx faxed.    KP 

## 2015-03-23 ENCOUNTER — Encounter: Payer: Self-pay | Admitting: Family

## 2015-03-23 ENCOUNTER — Ambulatory Visit (HOSPITAL_BASED_OUTPATIENT_CLINIC_OR_DEPARTMENT_OTHER): Payer: PPO | Admitting: Family

## 2015-03-23 ENCOUNTER — Telehealth: Payer: Self-pay | Admitting: Family Medicine

## 2015-03-23 ENCOUNTER — Other Ambulatory Visit (HOSPITAL_BASED_OUTPATIENT_CLINIC_OR_DEPARTMENT_OTHER): Payer: PPO

## 2015-03-23 VITALS — BP 137/62 | HR 85 | Temp 98.1°F | Resp 16 | Ht 66.0 in | Wt 166.0 lb

## 2015-03-23 DIAGNOSIS — D649 Anemia, unspecified: Secondary | ICD-10-CM | POA: Diagnosis not present

## 2015-03-23 DIAGNOSIS — D6959 Other secondary thrombocytopenia: Secondary | ICD-10-CM

## 2015-03-23 DIAGNOSIS — D500 Iron deficiency anemia secondary to blood loss (chronic): Secondary | ICD-10-CM | POA: Diagnosis not present

## 2015-03-23 DIAGNOSIS — K760 Fatty (change of) liver, not elsewhere classified: Secondary | ICD-10-CM | POA: Diagnosis not present

## 2015-03-23 LAB — CBC WITH DIFFERENTIAL (CANCER CENTER ONLY)
BASO#: 0 10*3/uL (ref 0.0–0.2)
BASO%: 0.2 % (ref 0.0–2.0)
EOS%: 4.2 % (ref 0.0–7.0)
Eosinophils Absolute: 0.2 10*3/uL (ref 0.0–0.5)
HCT: 33.3 % — ABNORMAL LOW (ref 34.8–46.6)
HGB: 10.8 g/dL — ABNORMAL LOW (ref 11.6–15.9)
LYMPH#: 0.8 10*3/uL — ABNORMAL LOW (ref 0.9–3.3)
LYMPH%: 16.7 % (ref 14.0–48.0)
MCH: 29.3 pg (ref 26.0–34.0)
MCHC: 32.4 g/dL (ref 32.0–36.0)
MCV: 91 fL (ref 81–101)
MONO#: 0.4 10*3/uL (ref 0.1–0.9)
MONO%: 7.8 % (ref 0.0–13.0)
NEUT#: 3.2 10*3/uL (ref 1.5–6.5)
NEUT%: 71.1 % (ref 39.6–80.0)
Platelets: 64 10*3/uL — ABNORMAL LOW (ref 145–400)
RBC: 3.68 10*6/uL — ABNORMAL LOW (ref 3.70–5.32)
RDW: 20.3 % — ABNORMAL HIGH (ref 11.1–15.7)
WBC: 4.5 10*3/uL (ref 3.9–10.0)

## 2015-03-23 LAB — COMPREHENSIVE METABOLIC PANEL
ALT: 29 U/L (ref 0–55)
AST: 36 U/L — ABNORMAL HIGH (ref 5–34)
Albumin: 3.7 g/dL (ref 3.5–5.0)
Alkaline Phosphatase: 100 U/L (ref 40–150)
Anion Gap: 9 mEq/L (ref 3–11)
BUN: 17.8 mg/dL (ref 7.0–26.0)
CO2: 25 mEq/L (ref 22–29)
Calcium: 9.5 mg/dL (ref 8.4–10.4)
Chloride: 105 mEq/L (ref 98–109)
Creatinine: 0.9 mg/dL (ref 0.6–1.1)
EGFR: 64 mL/min/{1.73_m2} — ABNORMAL LOW (ref >90)
Glucose: 195 mg/dl — ABNORMAL HIGH (ref 70–140)
Potassium: 4 mEq/L (ref 3.5–5.1)
Sodium: 139 mEq/L (ref 136–145)
Total Bilirubin: 1.04 mg/dL (ref 0.20–1.20)
Total Protein: 6.5 g/dL (ref 6.4–8.3)

## 2015-03-23 NOTE — Telephone Encounter (Signed)
Caller name: Akiera Relation to pt: self Call back number: [phone]  Pharmacy: [facility] at [facility]  Reason for call: Pt came in office requesting needing refill for prescription Meloxicam tab 7.5mg  generic for Mobic- take 1 tablet by mouth two times daily and for Mylan -Levothyrox TB 0.075 mg - take 1 tablet by mouth daily, Pt mentioned that rx was not given to [facility] by [facility] so she would like to know if she can have it sent to [facility] even dough she has to pay for it. Pt is needing it. Please advise.

## 2015-03-23 NOTE — Progress Notes (Signed)
Hematology and Oncology Follow Up Visit  Jessica Dennis [id number] Sep 26, 1932 82 y.o. 03/23/2015   Principle Diagnosis:  Chronic thrombocytopenia secondary to hepato-steatosis and splenomegaly Iron deficiency anemia secondary to GI bleed   Current Therapy:   Observation    Interim History:  Jessica Dennis is here today for a follow-up. She is still having some mild fatigue at times.  Her platelet count is 64. No episodes of bleeding. She has noticed that she does bruise easily. She responded nicely to the Feraheme infusion she received in January. Her Hgb is up at 10.8 with an MCV of 91.  No fever, chills, n/v, cough, rash, dizziness, headaches, blurred vision, SOB, chest pain, palpitations, abdominal pain or changes in bowel or bladder habits.  No lymphadenopathy found on exam.  She has a skin graft to the back of her right leg where she had a skin cancer lesion removed. She states that the borders were clear but once her wound heals she may have radiation. Her bandage is clean and dry. Her right ankle is a little swollen. She follows up with her surgeon next week. She has numbness around the ankle and side of her right foot since her surgery.  She has had no falls or syncopal episodes.  Her blood sugars have been fairly well controlled.  She is eating healthy and staying well hydrated. Her weight is stable.   Medications:    Medication List       This list is accurate as of: 03/23/15  2:58 PM.  Always use your most recent med list.               accu-chek soft touch lancets  Use as instructed     CALCIUM + D PO  Take by mouth 2 (two) times daily.     doxycycline 100 MG tablet  Commonly known as:  VIBRA-TABS  Take 1 tablet (100 mg total) by mouth 2 (two) times daily.     Fish Oil 1000 MG Caps  Take by mouth every morning.     furosemide 20 MG tablet  Commonly known as:  LASIX  Take 1 tablet (20 mg total) by mouth daily.     glucose blood test strip  Commonly known as:   ACCU-CHEK AVIVA PLUS  1 each by Other route as needed.     latanoprost 0.005 % ophthalmic solution  Commonly known as:  XALATAN  Place 1 drop into both eyes at bedtime.     levothyroxine 75 MCG tablet  Commonly known as:  SYNTHROID, LEVOTHROID  Take 1 tablet by mouth  daily     LORazepam 0.5 MG tablet  Commonly known as:  ATIVAN  TAKE 1 TABLET BY MOUTH DAILY AS NEEDED     meloxicam 7.5 MG tablet  Commonly known as:  MOBIC  Take 1 tablet by mouth two  times daily     metFORMIN 500 MG 24 hr tablet  Commonly known as:  GLUCOPHAGE-XR  Take 1 tablet (500 mg total) by mouth 2 (two) times daily.     pantoprazole 40 MG tablet  Commonly known as:  PROTONIX  Take 1 tablet by mouth  daily     potassium chloride SA 20 MEQ tablet  Commonly known as:  K-DUR,KLOR-CON  Take 1 tablet (20 mEq total) by mouth daily.     Prednicarbate 0.1 % Crea  Apply topically 2 (two) times daily.     quinapril 40 MG tablet  Commonly known as:  ACCUPRIL  Take  1 tablet (40 mg total) by mouth daily.     vitamin C 1000 MG tablet  Take 1,000 mg by mouth daily.        Allergies: No Known Allergies  Past Medical History, Surgical history, Social history, and Family History were reviewed and updated.  Review of Systems: All other 10 point review of systems is negative.   Physical Exam:  height is 5\' 6"  (1.676 m) and weight is 166 lb (75.297 kg). Her oral temperature is 98.1 F (36.7 C). Her blood pressure is 137/62 and her pulse is 85. Her respiration is 16.   Wt Readings from Last 3 Encounters:  03/23/15 166 lb (75.297 kg)  03/09/15 165 lb 14.4 oz (75.252 kg)  02/23/15 167 lb (75.751 kg)    Ocular: Sclerae unicteric, pupils equal, round and reactive to light Ear-nose-throat: Oropharynx clear, dentition fair Lymphatic: No cervical supraclavicular or axillary adenopathy  Lungs no rales or rhonchi, good excursion bilaterally Heart regular rate and rhythm, no murmur appreciated Abd soft,  nontender, positive bowel sounds, no liver or spleen tip palpated on exam MSK no focal spinal tenderness, no joint edema Neuro: non-focal, well-oriented, appropriate affect Breasts: Deferred  Lab Results  Component Value Date   WBC 4.5 03/23/2015   HGB 10.8* 03/23/2015   HCT 33.3* 03/23/2015   MCV 91 03/23/2015   PLT 64* 03/23/2015   Lab Results  Component Value Date   FERRITIN 34 02/23/2015   IRON 81 02/23/2015   TIBC 335 02/23/2015   UIBC 254 02/23/2015   IRONPCTSAT 24 02/23/2015   Lab Results  Component Value Date   RBC 3.68* 03/23/2015   No results found for: KPAFRELGTCHN, LAMBDASER, KAPLAMBRATIO No results found for: IGGSERUM, IGA, IGMSERUM No results found for: Odetta Pink, SPEI   Chemistry      Component Value Date/Time   NA 138 02/23/2015 1342   NA 139 11/02/2014 1102   K 4.2 02/23/2015 1342   K 3.8 11/02/2014 1102   CL 103 11/02/2014 1102   CO2 22 02/23/2015 1342   CO2 27 11/02/2014 1102   BUN 21.5 02/23/2015 1342   BUN 15 11/02/2014 1102   CREATININE 0.8 02/23/2015 1342   CREATININE 0.65 11/02/2014 1102   CREATININE 0.65 05/11/2014 1045      Component Value Date/Time   CALCIUM 9.6 02/23/2015 1342   CALCIUM 9.4 11/02/2014 1102   ALKPHOS 96 02/23/2015 1342   ALKPHOS 85 11/02/2014 1102   AST 33 02/23/2015 1342   AST 27 11/02/2014 1102   ALT 27 02/23/2015 1342   ALT 18 11/02/2014 1102   BILITOT 1.07 02/23/2015 1342   BILITOT 0.8 11/02/2014 1102     Impression and Plan: Jessica Dennis is a very charming 82 year old white female with chronic thrombocytopenia secondary to hepatic steatosis and splenomegaly. She has also had iron deficiency anemia due to GI bleed. She received Feraheme in January and is feeling a little better but still has some fatigue at times. Her Hgb is improved at 10.8.  We will see what her iron studies show.  We will plan to see her back in 2 months for labs and follow-up.  She  knows to contact us with any questions or concerns. We can certainly see her sooner if need be.   Eliezer Bottom, NP 3/1/20172:58 PM

## 2015-03-24 ENCOUNTER — Other Ambulatory Visit: Payer: Self-pay | Admitting: Family Medicine

## 2015-03-24 DIAGNOSIS — Z1231 Encounter for screening mammogram for malignant neoplasm of breast: Secondary | ICD-10-CM

## 2015-03-24 LAB — FERRITIN: Ferritin: 48 ng/ml (ref 9–269)

## 2015-03-24 LAB — IRON AND TIBC
%SAT: 25 % (ref 21–57)
Iron: 77 ug/dL (ref 41–142)
TIBC: 301 ug/dL (ref 236–444)
UIBC: 225 ug/dL (ref 120–384)

## 2015-03-24 MED ORDER — MELOXICAM 7.5 MG PO TABS: ORAL_TABLET | ORAL | Status: DC

## 2015-03-24 MED ORDER — LEVOTHYROXINE SODIUM 75 MCG PO TABS: ORAL_TABLET | ORAL | Status: DC

## 2015-03-24 NOTE — Telephone Encounter (Signed)
Medications have been faxed to the local pharmacy.    KP

## 2015-03-29 ENCOUNTER — Other Ambulatory Visit: Payer: Self-pay | Admitting: Family Medicine

## 2015-03-31 ENCOUNTER — Ambulatory Visit (HOSPITAL_BASED_OUTPATIENT_CLINIC_OR_DEPARTMENT_OTHER)
Admission: RE | Admit: 2015-03-31 | Discharge: 2015-03-31 | Disposition: A | Discharge status: Home / Self Care | Payer: PPO | Source: Ambulatory Visit | Attending: Family Medicine | Admitting: Family Medicine

## 2015-03-31 DIAGNOSIS — Z1231 Encounter for screening mammogram for malignant neoplasm of breast: Secondary | ICD-10-CM | POA: Diagnosis not present

## 2015-04-06 ENCOUNTER — Other Ambulatory Visit: Payer: Self-pay | Admitting: Family Medicine

## 2015-04-06 MED FILL — METFORMIN HCL ER 500 MG TAB: 500 | 30 days supply | Qty: 60 | Fill #0

## 2015-04-06 NOTE — Telephone Encounter (Signed)
Patient requested refill on metformin refilled rx for #90 with 0, Patient has a lab apt on may 1st and has an apt to be seen in may as well.

## 2015-04-11 DIAGNOSIS — H2513 Age-related nuclear cataract, bilateral: Secondary | ICD-10-CM | POA: Diagnosis not present

## 2015-04-11 DIAGNOSIS — H5203 Hypermetropia, bilateral: Secondary | ICD-10-CM | POA: Diagnosis not present

## 2015-04-11 DIAGNOSIS — H401132 Primary open-angle glaucoma, bilateral, moderate stage: Secondary | ICD-10-CM | POA: Diagnosis not present

## 2015-04-11 DIAGNOSIS — E119 Type 2 diabetes mellitus without complications: Secondary | ICD-10-CM | POA: Diagnosis not present

## 2015-04-14 ENCOUNTER — Other Ambulatory Visit: Payer: Self-pay

## 2015-04-14 DIAGNOSIS — D696 Thrombocytopenia, unspecified: Secondary | ICD-10-CM

## 2015-04-14 MED ORDER — GLUCOSE BLOOD VI STRP: 1.0000 | ORAL_STRIP | Status: DC | PRN

## 2015-04-14 MED ORDER — ACCU-CHEK FASTCLIX LANCETS MISC: Status: DC

## 2015-04-18 MED FILL — ONE TOUCH DELICA 33G LANCET: 90 days supply | Qty: 100 | Fill #0

## 2015-04-18 MED FILL — ONE TOUCH ULTRA 2 GLUCOSE S: W/DEVICE | 1 days supply | Qty: 1 | Fill #0

## 2015-04-18 MED FILL — ONE TOUCH ULTRA TEST STRIPS: 50 days supply | Qty: 50 | Fill #0

## 2015-04-19 MED ORDER — ONETOUCH VERIO VI STRP: ORAL_STRIP | Status: DC

## 2015-04-19 MED ORDER — ONETOUCH DELICA LANCETS FINE MISC: Status: DC

## 2015-04-19 NOTE — Addendum Note (Signed)
Addended by: Ewing Schlein on: 04/19/2015 08:41 AM   Modules accepted: Orders

## 2015-04-21 ENCOUNTER — Ambulatory Visit
Admission: RE | Admit: 2015-04-21 | Discharge: 2015-04-21 | Disposition: A | Discharge status: Home / Self Care | Payer: PPO | Source: Ambulatory Visit | Attending: Radiation Oncology | Admitting: Radiation Oncology

## 2015-04-21 ENCOUNTER — Ambulatory Visit: Payer: PPO | Admitting: Radiation Oncology

## 2015-04-21 ENCOUNTER — Inpatient Hospital Stay: Admission: RE | Admit: 2015-04-21 | Payer: PPO | Source: Ambulatory Visit | Admitting: Radiation Oncology

## 2015-04-21 ENCOUNTER — Ambulatory Visit: Admission: RE | Admit: 2015-04-21 | Payer: PPO | Source: Ambulatory Visit | Admitting: Radiation Oncology

## 2015-04-21 ENCOUNTER — Ambulatory Visit: Payer: PPO

## 2015-04-22 MED FILL — ACCU-CHEK FASTCLIX LANCETS: 102 days supply | Qty: 102 | Fill #0

## 2015-04-28 ENCOUNTER — Other Ambulatory Visit: Payer: Self-pay | Admitting: Family Medicine

## 2015-04-28 NOTE — Telephone Encounter (Signed)
Patient requesting refill on meloxicam

## 2015-05-05 ENCOUNTER — Telehealth: Payer: Self-pay | Admitting: Family Medicine

## 2015-05-05 DIAGNOSIS — E119 Type 2 diabetes mellitus without complications: Secondary | ICD-10-CM

## 2015-05-05 DIAGNOSIS — E785 Hyperlipidemia, unspecified: Secondary | ICD-10-CM

## 2015-05-05 MED ORDER — METFORMIN HCL ER 500 MG PO TB24: ORAL_TABLET | ORAL | Status: DC

## 2015-05-05 MED FILL — METFORMIN HCL ER 500 MG TAB: 500 | 30 days supply | Qty: 60 | Fill #0

## 2015-05-05 NOTE — Telephone Encounter (Signed)
The Rx has been faxed, the patient needs a follow up/Fasting. Please schedule.      KP

## 2015-05-05 NOTE — Telephone Encounter (Signed)
°  Relation to [id number] Call back number:[phone] Pharmacy:[facility]  Reason for call: pt is needing rx metFORMIN (GLUCOPHAGE-XR) 500 MG 24 hr tablet , pt would like call back when rx is ready for pick states to leave a message if she does not answer.

## 2015-05-10 NOTE — Telephone Encounter (Signed)
Patient has a scheduled physical for 06/02/2015

## 2015-05-21 ENCOUNTER — Other Ambulatory Visit: Payer: Self-pay | Admitting: Family Medicine

## 2015-05-23 ENCOUNTER — Other Ambulatory Visit (HOSPITAL_BASED_OUTPATIENT_CLINIC_OR_DEPARTMENT_OTHER): Payer: PPO

## 2015-05-23 ENCOUNTER — Encounter: Payer: Self-pay | Admitting: Hematology & Oncology

## 2015-05-23 ENCOUNTER — Ambulatory Visit (HOSPITAL_BASED_OUTPATIENT_CLINIC_OR_DEPARTMENT_OTHER): Payer: PPO | Admitting: Hematology & Oncology

## 2015-05-23 VITALS — BP 148/59 | HR 83 | Temp 97.4°F | Resp 18 | Ht 66.0 in | Wt 164.0 lb

## 2015-05-23 DIAGNOSIS — R161 Splenomegaly, not elsewhere classified: Secondary | ICD-10-CM | POA: Diagnosis not present

## 2015-05-23 DIAGNOSIS — D509 Iron deficiency anemia, unspecified: Secondary | ICD-10-CM | POA: Diagnosis not present

## 2015-05-23 DIAGNOSIS — D500 Iron deficiency anemia secondary to blood loss (chronic): Secondary | ICD-10-CM | POA: Diagnosis not present

## 2015-05-23 DIAGNOSIS — D6959 Other secondary thrombocytopenia: Secondary | ICD-10-CM | POA: Diagnosis not present

## 2015-05-23 DIAGNOSIS — R195 Other fecal abnormalities: Secondary | ICD-10-CM

## 2015-05-23 DIAGNOSIS — K922 Gastrointestinal hemorrhage, unspecified: Secondary | ICD-10-CM | POA: Diagnosis not present

## 2015-05-23 LAB — CBC WITH DIFFERENTIAL (CANCER CENTER ONLY)
BASO#: 0 10*3/uL (ref 0.0–0.2)
BASO%: 0.4 % (ref 0.0–2.0)
EOS%: 3.6 % (ref 0.0–7.0)
Eosinophils Absolute: 0.2 10*3/uL (ref 0.0–0.5)
HCT: 37.1 % (ref 34.8–46.6)
HGB: 12.9 g/dL (ref 11.6–15.9)
LYMPH#: 0.8 10*3/uL — ABNORMAL LOW (ref 0.9–3.3)
LYMPH%: 15.3 % (ref 14.0–48.0)
MCH: 32.6 pg (ref 26.0–34.0)
MCHC: 34.8 g/dL (ref 32.0–36.0)
MCV: 94 fL (ref 81–101)
MONO#: 0.4 10*3/uL (ref 0.1–0.9)
MONO%: 7.3 % (ref 0.0–13.0)
NEUT#: 3.7 10*3/uL (ref 1.5–6.5)
NEUT%: 73.4 % (ref 39.6–80.0)
Platelets: 71 10*3/uL — ABNORMAL LOW (ref 145–400)
RBC: 3.96 10*6/uL (ref 3.70–5.32)
RDW: 14.6 % (ref 11.1–15.7)
WBC: 5 10*3/uL (ref 3.9–10.0)

## 2015-05-23 LAB — CMP (CANCER CENTER ONLY)
ALT(SGPT): 34 U/L (ref 10–47)
AST: 37 U/L (ref 11–38)
Albumin: 3.4 g/dL (ref 3.3–5.5)
Alkaline Phosphatase: 93 U/L — ABNORMAL HIGH (ref 26–84)
BUN, Bld: 13 mg/dL (ref 7–22)
CO2: 25 mEq/L (ref 18–33)
Calcium: 9.3 mg/dL (ref 8.0–10.3)
Chloride: 101 mEq/L (ref 98–108)
Creat: 0.7 mg/dl (ref 0.6–1.2)
Glucose, Bld: 273 mg/dL — ABNORMAL HIGH (ref 73–118)
Potassium: 3.4 mEq/L (ref 3.3–4.7)
Sodium: 139 mEq/L (ref 128–145)
Total Bilirubin: 1.7 mg/dl — ABNORMAL HIGH (ref 0.20–1.60)
Total Protein: 6.3 g/dL — ABNORMAL LOW (ref 6.4–8.1)

## 2015-05-23 NOTE — Progress Notes (Signed)
Principle Diagnosis:   Chronic thrombocytopenia secondary to hepato-steatosis and splenomegaly  GI bleeding with iron deficiency  Current Therapy:    IV iron as indicated - last dose given January 2017     Interim History:  Jessica Dennis is back for follow-up. We last saw her 2 months ago. She had IV iron back in January. She recently had Mohs surgery at the bottom of her right leg. This is very slow to heal up. I think her blood sugars have a lot to do with this.  Her blood sugars have been very difficult to control. She is not on insulin. I spent that she probably will need to be on insulin.  She says that she still has some bleeding per rectum. She says her stools are black on occasion. She does take oral iron.  She's had no cough. She's had no weight loss. She's had no rashes. She's had no headache.  Overall, her performance status is ECOG 1.  Medications:  Current outpatient prescriptions:  .  ACCU-CHEK FASTCLIX LANCETS MISC, Check Blood sugar once daily. Dx:E11.9, Disp: 50 each, Rfl: 12 .  Ascorbic Acid (VITAMIN C) 1000 MG tablet, Take 1,000 mg by mouth daily., Disp: , Rfl:  .  Calcium Carbonate-Vitamin D (CALCIUM + D PO), Take by mouth 2 (two) times daily., Disp: , Rfl:  .  Calcium Carbonate-Vitamin D 600-200 MG-UNIT CAPS, Take by mouth., Disp: , Rfl:  .  doxycycline (VIBRA-TABS) 100 MG tablet, Take 1 tablet (100 mg total) by mouth 2 (two) times daily., Disp: 20 tablet, Rfl: 0 .  ferrous sulfate 325 (65 FE) MG tablet, Take 325 mg by mouth., Disp: , Rfl:  .  furosemide (LASIX) 20 MG tablet, Take 1 tablet (20 mg total) by mouth daily., Disp: 90 tablet, Rfl: 1 .  KLOR-CON M20 20 MEQ tablet, TAKE 1 TABLET BY MOUTH DAILY, Disp: 90 tablet, Rfl: 0 .  latanoprost (XALATAN) 0.005 % ophthalmic solution, Place 1 drop into both eyes at bedtime. , Disp: , Rfl:  .  levothyroxine (SYNTHROID, LEVOTHROID) 75 MCG tablet, TAKE ONE TABLET BY MOUTH DAILY, Disp: 30 tablet, Rfl: 1 .  LORazepam (ATIVAN)  0.5 MG tablet, TAKE 1 TABLET BY MOUTH DAILY AS NEEDED, Disp: 90 tablet, Rfl: 0 .  meloxicam (MOBIC) 7.5 MG tablet, TAKE ONE TABLET BY MOUTH TWO TIMES A DAY, Disp: 60 tablet, Rfl: 0 .  metFORMIN (GLUCOPHAGE-XR) 500 MG 24 hr tablet, TAKE 1 TABLET BY MOUTH 2 TIMES DAILY., Disp: 60 tablet, Rfl: 0 .  Omega-3 Fatty Acids (FISH OIL) 1000 MG CAPS, Take by mouth every morning., Disp: , Rfl:  .  ONETOUCH DELICA LANCETS FINE MISC, Check blood sugar once daily. Dx:E11.9, Disp: 100 each, Rfl: 12 .  ONETOUCH VERIO test strip, Check blood sugar once daily. Dx:E11.9, Disp: 100 each, Rfl: 12 .  pantoprazole (PROTONIX) 40 MG tablet, Take 1 tablet by mouth  daily, Disp: 90 tablet, Rfl: 1 .  Prednicarbate 0.1 % CREA, Apply topically 2 (two) times daily. , Disp: , Rfl:  .  quinapril (ACCUPRIL) 40 MG tablet, Take 1 tablet (40 mg total) by mouth daily., Disp: 90 tablet, Rfl: 1  Allergies: No Known Allergies  Past Medical History, Surgical history, Social history, and Family History were reviewed and updated.  Review of Systems: As above  Physical Exam:  height is 5\' 6"  (1.676 m) and weight is 164 lb (74.39 kg). Her oral temperature is 97.4 F (36.3 C). Her blood pressure is 148/59 and her pulse  is 83. Her respiration is 18.   Wt Readings from Last 3 Encounters:  05/23/15 164 lb (74.39 kg)  03/23/15 166 lb (75.297 kg)  03/09/15 165 lb 14.4 oz (75.252 kg)     Head and neck exam shows no ocular or oral lesions. She has no adenopathy in the neck. Thyroid is nonpalpable. Lungs are clear. Cardiac exam regular rate and rhythm with no murmurs, rubs or bruits. Abdomen is soft. She has good bowel sounds. There is no fluid wave. Her spleen tip is palpable just at the left costal margin. There is no hepatomegaly. Back exam shows no tenderness over the spine, ribs or hips. Extremities shows no clubbing, cyanosis or edema. She has good range of motion of her joints. She has good strength. Skin exam shows no rashes,  ecchymoses or petechia. Neurological exam shows no focal neurological deficits. Rectal exam shows no mass in the rectal vault. Stool is brown but heme positive.  Lab Results  Component Value Date   WBC 5.0 05/23/2015   HGB 12.9 05/23/2015   HCT 37.1 05/23/2015   MCV 94 05/23/2015   PLT 71* 05/23/2015     Chemistry      Component Value Date/Time   NA 139 05/23/2015 1421   NA 139 03/23/2015 1415   NA 139 11/02/2014 1102   K 3.4 05/23/2015 1421   K 4.0 03/23/2015 1415   K 3.8 11/02/2014 1102   CL 101 05/23/2015 1421   CL 103 11/02/2014 1102   CO2 25 05/23/2015 1421   CO2 25 03/23/2015 1415   CO2 27 11/02/2014 1102   BUN 13 05/23/2015 1421   BUN 17.8 03/23/2015 1415   BUN 15 11/02/2014 1102   CREATININE 0.7 05/23/2015 1421   CREATININE 0.9 03/23/2015 1415   CREATININE 0.65 11/02/2014 1102      Component Value Date/Time   CALCIUM 9.3 05/23/2015 1421   CALCIUM 9.5 03/23/2015 1415   CALCIUM 9.4 11/02/2014 1102   ALKPHOS 93* 05/23/2015 1421   ALKPHOS 100 03/23/2015 1415   ALKPHOS 85 11/02/2014 1102   AST 37 05/23/2015 1421   AST 36* 03/23/2015 1415   AST 27 11/02/2014 1102   ALT 34 05/23/2015 1421   ALT 29 03/23/2015 1415   ALT 18 11/02/2014 1102   BILITOT 1.70* 05/23/2015 1421   BILITOT 1.04 03/23/2015 1415   BILITOT 0.8 11/02/2014 1102         Impression and Plan: Jessica Dennis is a 82 year old white female with thrombocytopenia. Her thrombocytopenia is a little bit better. What is more important is fact that her hemoglobin is a whole lot better. Iron has helped her out quite a bit.  Her blood sugars will continue to be the biggest problem that she has. I am not sure when her family doctor will see her to try to help get her blood sugars under better control. I think she probably will need insulin.  I think from my point of view, we can get her back now in 4 months. I think this would be very reasonable.  I spent about 25 minutes with her.   Volanda Napoleon,  MD 5/1/20173:30 PM

## 2015-05-24 LAB — IRON AND TIBC
%SAT: 26 % (ref 21–57)
Iron: 74 ug/dL (ref 41–142)
TIBC: 290 ug/dL (ref 236–444)
UIBC: 216 ug/dL (ref 120–384)

## 2015-05-24 LAB — FERRITIN: Ferritin: 46 ng/ml (ref 9–269)

## 2015-06-01 ENCOUNTER — Telehealth: Payer: Self-pay | Admitting: Behavioral Health

## 2015-06-01 ENCOUNTER — Encounter: Payer: Self-pay | Admitting: Behavioral Health

## 2015-06-01 NOTE — Telephone Encounter (Signed)
Pre-Visit Call completed with patient and chart updated.   Pre-Visit Info documented in Specialty Comments under [employer].    

## 2015-06-02 ENCOUNTER — Encounter: Payer: Self-pay | Admitting: Family Medicine

## 2015-06-02 ENCOUNTER — Ambulatory Visit (INDEPENDENT_AMBULATORY_CARE_PROVIDER_SITE_OTHER): Payer: PPO | Admitting: Family Medicine

## 2015-06-02 ENCOUNTER — Telehealth: Payer: Self-pay | Admitting: Family Medicine

## 2015-06-02 VITALS — BP 142/76 | HR 83 | Temp 97.7°F | Ht 68.0 in | Wt 167.8 lb

## 2015-06-02 DIAGNOSIS — F411 Generalized anxiety disorder: Secondary | ICD-10-CM

## 2015-06-02 DIAGNOSIS — E1310 Other specified diabetes mellitus with ketoacidosis without coma: Secondary | ICD-10-CM | POA: Diagnosis not present

## 2015-06-02 DIAGNOSIS — R739 Hyperglycemia, unspecified: Secondary | ICD-10-CM | POA: Diagnosis not present

## 2015-06-02 DIAGNOSIS — E1110 Type 2 diabetes mellitus with ketoacidosis without coma: Secondary | ICD-10-CM

## 2015-06-02 DIAGNOSIS — I10 Essential (primary) hypertension: Secondary | ICD-10-CM

## 2015-06-02 DIAGNOSIS — Z0000 Encounter for general adult medical examination without abnormal findings: Secondary | ICD-10-CM | POA: Diagnosis not present

## 2015-06-02 DIAGNOSIS — M170 Bilateral primary osteoarthritis of knee: Secondary | ICD-10-CM

## 2015-06-02 DIAGNOSIS — E039 Hypothyroidism, unspecified: Secondary | ICD-10-CM | POA: Diagnosis not present

## 2015-06-02 DIAGNOSIS — K219 Gastro-esophageal reflux disease without esophagitis: Secondary | ICD-10-CM

## 2015-06-02 LAB — URINALYSIS, ROUTINE W REFLEX MICROSCOPIC
Bilirubin Urine: NEGATIVE
Hgb urine dipstick: NEGATIVE
Ketones, ur: NEGATIVE
Nitrite: NEGATIVE
Specific Gravity, Urine: <=1.005 — AB (ref 1.000–1.030)
Total Protein, Urine: NEGATIVE
Urine Glucose: NEGATIVE
Urobilinogen, UA: 0.2 (ref 0.0–1.0)
pH: 6 (ref 5.0–8.0)

## 2015-06-02 LAB — MICROALBUMIN / CREATININE URINE RATIO
Creatinine,U: 14.5 mg/dL
Microalb Creat Ratio: 1.4 mg/g (ref 0.0–30.0)
Microalb, Ur: 0.2 mg/dL (ref 0.0–1.9)

## 2015-06-02 LAB — POCT URINALYSIS DIPSTICK
Bilirubin, UA: NEGATIVE
Blood, UA: NEGATIVE
Glucose, UA: NEGATIVE
Ketones, UA: NEGATIVE
Leukocytes, UA: NEGATIVE
Nitrite, UA: NEGATIVE
Protein, UA: NEGATIVE
Spec Grav, UA: 1.015
Urobilinogen, UA: 0.2
pH, UA: 6

## 2015-06-02 LAB — COMPREHENSIVE METABOLIC PANEL
ALT: 25 U/L (ref 0–35)
AST: 36 U/L (ref 0–37)
Albumin: 4.2 g/dL (ref 3.5–5.2)
Alkaline Phosphatase: 98 U/L (ref 39–117)
BUN: 10 mg/dL (ref 6–23)
CO2: 28 mEq/L (ref 19–32)
Calcium: 9.6 mg/dL (ref 8.4–10.5)
Chloride: 101 mEq/L (ref 96–112)
Creatinine, Ser: 0.64 mg/dL (ref 0.40–1.20)
GFR: 94.79 mL/min (ref >60.00)
Glucose, Bld: 202 mg/dL — ABNORMAL HIGH (ref 70–99)
Potassium: 3.7 mEq/L (ref 3.5–5.1)
Sodium: 139 mEq/L (ref 135–145)
Total Bilirubin: 1.5 mg/dL — ABNORMAL HIGH (ref 0.2–1.2)
Total Protein: 6.6 g/dL (ref 6.0–8.3)

## 2015-06-02 LAB — CBC WITH DIFFERENTIAL/PLATELET
Basophils Absolute: 0 10*3/uL (ref 0.0–0.1)
Basophils Relative: 0.7 % (ref 0.0–3.0)
Eosinophils Absolute: 0.2 10*3/uL (ref 0.0–0.7)
Eosinophils Relative: 3.6 % (ref 0.0–5.0)
HCT: 35.5 % — ABNORMAL LOW (ref 36.0–46.0)
Hemoglobin: 12 g/dL (ref 12.0–15.0)
Lymphocytes Relative: 18 % (ref 12.0–46.0)
Lymphs Abs: 0.8 10*3/uL (ref 0.7–4.0)
MCHC: 33.7 g/dL (ref 30.0–36.0)
MCV: 94.2 fl (ref 78.0–100.0)
Monocytes Absolute: 0.3 10*3/uL (ref 0.1–1.0)
Monocytes Relative: 6.5 % (ref 3.0–12.0)
Neutro Abs: 3.3 10*3/uL (ref 1.4–7.7)
Neutrophils Relative %: 71.2 % (ref 43.0–77.0)
Platelets: 62 10*3/uL — ABNORMAL LOW (ref 150.0–400.0)
RBC: 3.77 Mil/uL — ABNORMAL LOW (ref 3.87–5.11)
RDW: 16.3 % — ABNORMAL HIGH (ref 11.5–15.5)
WBC: 4.5 10*3/uL (ref 4.0–10.5)

## 2015-06-02 LAB — LIPID PANEL
Cholesterol: 198 mg/dL (ref 0–200)
HDL: 60.1 mg/dL (ref >39.00)
LDL Cholesterol: 121 mg/dL — ABNORMAL HIGH (ref 0–99)
NonHDL: 137.53
Total CHOL/HDL Ratio: 3
Triglycerides: 83 mg/dL (ref 0.0–149.0)
VLDL: 16.6 mg/dL (ref 0.0–40.0)

## 2015-06-02 LAB — TSH: TSH: 1.96 u[IU]/mL (ref 0.35–4.50)

## 2015-06-02 LAB — HEMOGLOBIN A1C: Hgb A1c MFr Bld: 6 % (ref 4.6–6.5)

## 2015-06-02 MED ORDER — FUROSEMIDE 20 MG PO TABS: 20.0000 mg | ORAL_TABLET | Freq: Every day | ORAL | Status: DC

## 2015-06-02 MED ORDER — QUINAPRIL HCL 40 MG PO TABS: 40.0000 mg | ORAL_TABLET | Freq: Every day | ORAL | Status: DC

## 2015-06-02 MED ORDER — LORAZEPAM 0.5 MG PO TABS: 0.5000 mg | ORAL_TABLET | Freq: Every day | ORAL | Status: DC | PRN

## 2015-06-02 MED ORDER — POTASSIUM CHLORIDE CRYS ER 20 MEQ PO TBCR: 20.0000 meq | EXTENDED_RELEASE_TABLET | Freq: Every day | ORAL | Status: DC

## 2015-06-02 MED ORDER — MELOXICAM 7.5 MG PO TABS: 7.5000 mg | ORAL_TABLET | Freq: Two times a day (BID) | ORAL | Status: DC

## 2015-06-02 MED ORDER — PANTOPRAZOLE SODIUM 40 MG PO TBEC: DELAYED_RELEASE_TABLET | ORAL | Status: DC

## 2015-06-02 NOTE — Patient Instructions (Addendum)
Rel of Records for eye exam Dr. Tora Kindred.  Preventive Care for Adults, Female A healthy lifestyle and preventive care can promote health and wellness. Preventive health guidelines for women include the following key practices.  A routine yearly physical is a good way to check with your health care provider about your health and preventive screening. It is a chance to share any concerns and updates on your health and to receive a thorough exam.  Visit your dentist for a routine exam and preventive care every 6 months. Brush your teeth twice a day and floss once a day. Good oral hygiene prevents tooth decay and gum disease.  The frequency of eye exams is based on your age, health, family medical history, use of contact lenses, and other factors. Follow your health care provider's recommendations for frequency of eye exams.  Eat a healthy diet. Foods like vegetables, fruits, whole grains, low-fat dairy products, and lean protein foods contain the nutrients you need without too many calories. Decrease your intake of foods high in solid fats, added sugars, and salt. Eat the right amount of calories for you.Get information about a proper diet from your health care provider, if necessary.  Regular physical exercise is one of the most important things you can do for your health. Most adults should get at least 150 minutes of moderate-intensity exercise (any activity that increases your heart rate and causes you to sweat) each week. In addition, most adults need muscle-strengthening exercises on 2 or more days a week.  Maintain a healthy weight. The body mass index (BMI) is a screening tool to identify possible weight problems. It provides an estimate of body fat based on height and weight. Your health care provider can find your BMI and can help you achieve or maintain a healthy weight.For adults 20 years and older:  A BMI below 18.5 is considered underweight.  A BMI of 18.5 to 24.9 is normal.  A BMI  of 25 to 29.9 is considered overweight.  A BMI of 30 and above is considered obese.  Maintain normal blood lipids and cholesterol levels by exercising and minimizing your intake of saturated fat. Eat a balanced diet with plenty of fruit and vegetables. Blood tests for lipids and cholesterol should begin at age 41 and be repeated every 5 years. If your lipid or cholesterol levels are high, you are over 50, or you are at high risk for heart disease, you may need your cholesterol levels checked more frequently.Ongoing high lipid and cholesterol levels should be treated with medicines if diet and exercise are not working.  If you smoke, find out from your health care provider how to quit. If you do not use tobacco, do not start.  Lung cancer screening is recommended for adults aged 37-80 years who are at high risk for developing lung cancer because of a history of smoking. A yearly low-dose CT scan of the lungs is recommended for people who have at least a 30-pack-year history of smoking and are a current smoker or have quit within the past 15 years. A pack year of smoking is smoking an average of 1 pack of cigarettes a day for 1 year (for example: 1 pack a day for 30 years or 2 packs a day for 15 years). Yearly screening should continue until the smoker has stopped smoking for at least 15 years. Yearly screening should be stopped for people who develop a health problem that would prevent them from having lung cancer treatment.  If you  are pregnant, do not drink alcohol. If you are breastfeeding, be very cautious about drinking alcohol. If you are not pregnant and choose to drink alcohol, do not have more than 1 drink per day. One drink is considered to be 12 ounces (355 mL) of beer, 5 ounces (148 mL) of wine, or 1.5 ounces (44 mL) of liquor.  Avoid use of street drugs. Do not share needles with anyone. Ask for help if you need support or instructions about stopping the use of drugs.  High blood pressure  causes heart disease and increases the risk of stroke. Your blood pressure should be checked at least every 1 to 2 years. Ongoing high blood pressure should be treated with medicines if weight loss and exercise do not work.  If you are 45-49 years old, ask your health care provider if you should take aspirin to prevent strokes.  Diabetes screening is done by taking a blood sample to check your blood glucose level after you have not eaten for a certain period of time (fasting). If you are not overweight and you do not have risk factors for diabetes, you should be screened once every 3 years starting at age 53. If you are overweight or obese and you are 8-77 years of age, you should be screened for diabetes every year as part of your cardiovascular risk assessment.  Breast cancer screening is essential preventive care for women. You should practice "breast self-awareness." This means understanding the normal appearance and feel of your breasts and may include breast self-examination. Any changes detected, no matter how small, should be reported to a health care provider. Women in their 7s and 30s should have a clinical breast exam (CBE) by a health care provider as part of a regular health exam every 1 to 3 years. After age 71, women should have a CBE every year. Starting at age 53, women should consider having a mammogram (breast X-ray test) every year. Women who have a family history of breast cancer should talk to their health care provider about genetic screening. Women at a high risk of breast cancer should talk to their health care providers about having an MRI and a mammogram every year.  Breast cancer gene (BRCA)-related cancer risk assessment is recommended for women who have family members with BRCA-related cancers. BRCA-related cancers include breast, ovarian, tubal, and peritoneal cancers. Having family members with these cancers may be associated with an increased risk for harmful changes  (mutations) in the breast cancer genes BRCA1 and BRCA2. Results of the assessment will determine the need for genetic counseling and BRCA1 and BRCA2 testing.  Your health care provider may recommend that you be screened regularly for cancer of the pelvic organs (ovaries, uterus, and vagina). This screening involves a pelvic examination, including checking for microscopic changes to the surface of your cervix (Pap test). You may be encouraged to have this screening done every 3 years, beginning at age 71.  For women ages 92-65, health care providers may recommend pelvic exams and Pap testing every 3 years, or they may recommend the Pap and pelvic exam, combined with testing for human papilloma virus (HPV), every 5 years. Some types of HPV increase your risk of cervical cancer. Testing for HPV may also be done on women of any age with unclear Pap test results.  Other health care providers may not recommend any screening for nonpregnant women who are considered low risk for pelvic cancer and who do not have symptoms. Ask your health care  provider if a screening pelvic exam is right for you.  If you have had past treatment for cervical cancer or a condition that could lead to cancer, you need Pap tests and screening for cancer for at least 20 years after your treatment. If Pap tests have been discontinued, your risk factors (such as having a new sexual partner) need to be reassessed to determine if screening should resume. Some women have medical problems that increase the chance of getting cervical cancer. In these cases, your health care provider may recommend more frequent screening and Pap tests.  Colorectal cancer can be detected and often prevented. Most routine colorectal cancer screening begins at the age of 64 years and continues through age 7 years. However, your health care provider may recommend screening at an earlier age if you have risk factors for colon cancer. On a yearly basis, your health  care provider may provide home test kits to check for hidden blood in the stool. Use of a small camera at the end of a tube, to directly examine the colon (sigmoidoscopy or colonoscopy), can detect the earliest forms of colorectal cancer. Talk to your health care provider about this at age 58, when routine screening begins. Direct exam of the colon should be repeated every 5-10 years through age 61 years, unless early forms of precancerous polyps or small growths are found.  People who are at an increased risk for hepatitis B should be screened for this virus. You are considered at high risk for hepatitis B if:  You were born in a country where hepatitis B occurs often. Talk with your health care provider about which countries are considered high risk.  Your parents were born in a high-risk country and you have not received a shot to protect against hepatitis B (hepatitis B vaccine).  You have HIV or AIDS.  You use needles to inject street drugs.  You live with, or have sex with, someone who has hepatitis B.  You get hemodialysis treatment.  You take certain medicines for conditions like cancer, organ transplantation, and autoimmune conditions.  Hepatitis C blood testing is recommended for all people born from [birth year] through 1965 and any individual with known risks for hepatitis C.  Practice safe sex. Use condoms and avoid high-risk sexual practices to reduce the spread of sexually transmitted infections (STIs). STIs include gonorrhea, chlamydia, syphilis, trichomonas, herpes, HPV, and human immunodeficiency virus (HIV). Herpes, HIV, and HPV are viral illnesses that have no cure. They can result in disability, cancer, and death.  You should be screened for sexually transmitted illnesses (STIs) including gonorrhea and chlamydia if:  You are sexually active and are younger than 24 years.  You are older than 24 years and your health care provider tells you that you are at risk for this type of  infection.  Your sexual activity has changed since you were last screened and you are at an increased risk for chlamydia or gonorrhea. Ask your health care provider if you are at risk.  If you are at risk of being infected with HIV, it is recommended that you take a prescription medicine daily to prevent HIV infection. This is called preexposure prophylaxis (PrEP). You are considered at risk if:  You are sexually active and do not regularly use condoms or know the HIV status of your partner(s).  You take drugs by injection.  You are sexually active with a partner who has HIV.  Talk with your health care provider about whether you are  at high risk of being infected with HIV. If you choose to begin PrEP, you should first be tested for HIV. You should then be tested every 3 months for as long as you are taking PrEP.  Osteoporosis is a disease in which the bones lose minerals and strength with aging. This can result in serious bone fractures or breaks. The risk of osteoporosis can be identified using a bone density scan. Women ages 27 years and over and women at risk for fractures or osteoporosis should discuss screening with their health care providers. Ask your health care provider whether you should take a calcium supplement or vitamin D to reduce the rate of osteoporosis.  Menopause can be associated with physical symptoms and risks. Hormone replacement therapy is available to decrease symptoms and risks. You should talk to your health care provider about whether hormone replacement therapy is right for you.  Use sunscreen. Apply sunscreen liberally and repeatedly throughout the day. You should seek shade when your shadow is shorter than you. Protect yourself by wearing long sleeves, pants, a wide-brimmed hat, and sunglasses year round, whenever you are outdoors.  Once a month, do a whole body skin exam, using a mirror to look at the skin on your back. Tell your health care provider of new moles,  moles that have irregular borders, moles that are larger than a pencil eraser, or moles that have changed in shape or color.  Stay current with required vaccines (immunizations).  Influenza vaccine. All adults should be immunized every year.  Tetanus, diphtheria, and acellular pertussis (Td, Tdap) vaccine. Pregnant women should receive 1 dose of Tdap vaccine during each pregnancy. The dose should be obtained regardless of the length of time since the last dose. Immunization is preferred during the 27th-36th week of gestation. An adult who has not previously received Tdap or who does not know her vaccine status should receive 1 dose of Tdap. This initial dose should be followed by tetanus and diphtheria toxoids (Td) booster doses every 10 years. Adults with an unknown or incomplete history of completing a 3-dose immunization series with Td-containing vaccines should begin or complete a primary immunization series including a Tdap dose. Adults should receive a Td booster every 10 years.  Varicella vaccine. An adult without evidence of immunity to varicella should receive 2 doses or a second dose if she has previously received 1 dose. Pregnant females who do not have evidence of immunity should receive the first dose after pregnancy. This first dose should be obtained before leaving the health care facility. The second dose should be obtained 4-8 weeks after the first dose.  Human papillomavirus (HPV) vaccine. Females aged 13-26 years who have not received the vaccine previously should obtain the 3-dose series. The vaccine is not recommended for use in pregnant females. However, pregnancy testing is not needed before receiving a dose. If a female is found to be pregnant after receiving a dose, no treatment is needed. In that case, the remaining doses should be delayed until after the pregnancy. Immunization is recommended for any person with an immunocompromised condition through the age of 26 years if she  did not get any or all doses earlier. During the 3-dose series, the second dose should be obtained 4-8 weeks after the first dose. The third dose should be obtained 24 weeks after the first dose and 16 weeks after the second dose.  Zoster vaccine. One dose is recommended for adults aged 66 years or older unless certain conditions are present.  Measles, mumps, and rubella (MMR) vaccine. Adults born before 1957 generally are considered immune to measles and mumps. Adults born in 1957 or later should have 1 or more doses of MMR vaccine unless there is a contraindication to the vaccine or there is laboratory evidence of immunity to each of the three diseases. A routine second dose of MMR vaccine should be obtained at least 28 days after the first dose for students attending postsecondary schools, health care workers, or international travelers. People who received inactivated measles vaccine or an unknown type of measles vaccine during 1963-1967 should receive 2 doses of MMR vaccine. People who received inactivated mumps vaccine or an unknown type of mumps vaccine before 1979 and are at high risk for mumps infection should consider immunization with 2 doses of MMR vaccine. For females of childbearing age, rubella immunity should be determined. If there is no evidence of immunity, females who are not pregnant should be vaccinated. If there is no evidence of immunity, females who are pregnant should delay immunization until after pregnancy. Unvaccinated health care workers born before 1957 who lack laboratory evidence of measles, mumps, or rubella immunity or laboratory confirmation of disease should consider measles and mumps immunization with 2 doses of MMR vaccine or rubella immunization with 1 dose of MMR vaccine.  Pneumococcal 13-valent conjugate (PCV13) vaccine. When indicated, a person who is uncertain of his immunization history and has no record of immunization should receive the PCV13 vaccine. All adults  65 years of age and older should receive this vaccine. An adult aged 19 years or older who has certain medical conditions and has not been previously immunized should receive 1 dose of PCV13 vaccine. This PCV13 should be followed with a dose of pneumococcal polysaccharide (PPSV23) vaccine. Adults who are at high risk for pneumococcal disease should obtain the PPSV23 vaccine at least 8 weeks after the dose of PCV13 vaccine. Adults older than 80 years of age who have normal immune system function should obtain the PPSV23 vaccine dose at least 1 year after the dose of PCV13 vaccine.  Pneumococcal polysaccharide (PPSV23) vaccine. When PCV13 is also indicated, PCV13 should be obtained first. All adults aged 65 years and older should be immunized. An adult younger than age 65 years who has certain medical conditions should be immunized. Any person who resides in a nursing home or long-term care facility should be immunized. An adult smoker should be immunized. People with an immunocompromised condition and certain other conditions should receive both PCV13 and PPSV23 vaccines. People with human immunodeficiency virus (HIV) infection should be immunized as soon as possible after diagnosis. Immunization during chemotherapy or radiation therapy should be avoided. Routine use of PPSV23 vaccine is not recommended for American Indians, [place] Natives, or people younger than 65 years unless there are medical conditions that require PPSV23 vaccine. When indicated, people who have unknown immunization and have no record of immunization should receive PPSV23 vaccine. One-time revaccination 5 years after the first dose of PPSV23 is recommended for people aged 19-64 years who have chronic kidney failure, nephrotic syndrome, asplenia, or immunocompromised conditions. People who received 1-2 doses of PPSV23 before age 65 years should receive another dose of PPSV23 vaccine at age 65 years or later if at least 5 years have passed since  the previous dose. Doses of PPSV23 are not needed for people immunized with PPSV23 at or after age 65 years.  Meningococcal vaccine. Adults with asplenia or persistent complement component deficiencies should receive 2 doses of quadrivalent meningococcal   conjugate (MenACWY-D) vaccine. The doses should be obtained at least 2 months apart. Microbiologists working with certain meningococcal bacteria, military recruits, people at risk during an outbreak, and people who travel to or live in countries with a high rate of meningitis should be immunized. A first-year college student up through age 21 years who is living in a residence hall should receive a dose if she did not receive a dose on or after her 16th birthday. Adults who have certain high-risk conditions should receive one or more doses of vaccine.  Hepatitis A vaccine. Adults who wish to be protected from this disease, have certain high-risk conditions, work with hepatitis A-infected animals, work in hepatitis A research labs, or travel to or work in countries with a high rate of hepatitis A should be immunized. Adults who were previously unvaccinated and who anticipate close contact with an international adoptee during the first 60 days after arrival in the [place] from a country with a high rate of hepatitis A should be immunized.  Hepatitis B vaccine. Adults who wish to be protected from this disease, have certain high-risk conditions, may be exposed to blood or other infectious body fluids, are household contacts or sex partners of hepatitis B positive people, are clients or workers in certain care facilities, or travel to or work in countries with a high rate of hepatitis B should be immunized.  Haemophilus influenzae type b (Hib) vaccine. A previously unvaccinated person with asplenia or sickle cell disease or having a scheduled splenectomy should receive 1 dose of Hib vaccine. Regardless of previous immunization, a recipient of a  hematopoietic stem cell transplant should receive a 3-dose series 6-12 months after her successful transplant. Hib vaccine is not recommended for adults with HIV infection. Preventive Services / Frequency Ages 19 to 39 years  Blood pressure check.** / Every 3-5 years.  Lipid and cholesterol check.** / Every 5 years beginning at age 20.  Clinical breast exam.** / Every 3 years for women in their 20s and 30s.  BRCA-related cancer risk assessment.** / For women who have family members with a BRCA-related cancer (breast, ovarian, tubal, or peritoneal cancers).  Pap test.** / Every 2 years from ages 21 through 29. Every 3 years starting at age 30 through age 65 or 70 with a history of 3 consecutive normal Pap tests.  HPV screening.** / Every 3 years from ages 30 through ages 65 to 70 with a history of 3 consecutive normal Pap tests.  Hepatitis C blood test.** / For any individual with known risks for hepatitis C.  Skin self-exam. / Monthly.  Influenza vaccine. / Every year.  Tetanus, diphtheria, and acellular pertussis (Tdap, Td) vaccine.** / Consult your health care provider. Pregnant women should receive 1 dose of Tdap vaccine during each pregnancy. 1 dose of Td every 10 years.  Varicella vaccine.** / Consult your health care provider. Pregnant females who do not have evidence of immunity should receive the first dose after pregnancy.  HPV vaccine. / 3 doses over 6 months, if 26 and younger. The vaccine is not recommended for use in pregnant females. However, pregnancy testing is not needed before receiving a dose.  Measles, mumps, rubella (MMR) vaccine.** / You need at least 1 dose of MMR if you were born in 1957 or later. You may also need a 2nd dose. For females of childbearing age, rubella immunity should be determined. If there is no evidence of immunity, females who are not pregnant should be vaccinated. If there   is no evidence of immunity, females who are pregnant should delay  immunization until after pregnancy.  Pneumococcal 13-valent conjugate (PCV13) vaccine.** / Consult your health care provider.  Pneumococcal polysaccharide (PPSV23) vaccine.** / 1 to 2 doses if you smoke cigarettes or if you have certain conditions.  Meningococcal vaccine.** / 1 dose if you are age 19 to 21 years and a first-year college student living in a residence hall, or have one of several medical conditions, you need to get vaccinated against meningococcal disease. You may also need additional booster doses.  Hepatitis A vaccine.** / Consult your health care provider.  Hepatitis B vaccine.** / Consult your health care provider.  Haemophilus influenzae type b (Hib) vaccine.** / Consult your health care provider. Ages 40 to 64 years  Blood pressure check.** / Every year.  Lipid and cholesterol check.** / Every 5 years beginning at age 20 years.  Lung cancer screening. / Every year if you are aged 55-80 years and have a 30-pack-year history of smoking and currently smoke or have quit within the past 15 years. Yearly screening is stopped once you have quit smoking for at least 15 years or develop a health problem that would prevent you from having lung cancer treatment.  Clinical breast exam.** / Every year after age 40 years.  BRCA-related cancer risk assessment.** / For women who have family members with a BRCA-related cancer (breast, ovarian, tubal, or peritoneal cancers).  Mammogram.** / Every year beginning at age 40 years and continuing for as long as you are in good health. Consult with your health care provider.  Pap test.** / Every 3 years starting at age 30 years through age 65 or 70 years with a history of 3 consecutive normal Pap tests.  HPV screening.** / Every 3 years from ages 30 years through ages 65 to 70 years with a history of 3 consecutive normal Pap tests.  Fecal occult blood test (FOBT) of stool. / Every year beginning at age 50 years and continuing until age  75 years. You may not need to do this test if you get a colonoscopy every 10 years.  Flexible sigmoidoscopy or colonoscopy.** / Every 5 years for a flexible sigmoidoscopy or every 10 years for a colonoscopy beginning at age 50 years and continuing until age 75 years.  Hepatitis C blood test.** / For all people born from 1945 through 1965 and any individual with known risks for hepatitis C.  Skin self-exam. / Monthly.  Influenza vaccine. / Every year.  Tetanus, diphtheria, and acellular pertussis (Tdap/Td) vaccine.** / Consult your health care provider. Pregnant women should receive 1 dose of Tdap vaccine during each pregnancy. 1 dose of Td every 10 years.  Varicella vaccine.** / Consult your health care provider. Pregnant females who do not have evidence of immunity should receive the first dose after pregnancy.  Zoster vaccine.** / 1 dose for adults aged 60 years or older.  Measles, mumps, rubella (MMR) vaccine.** / You need at least 1 dose of MMR if you were born in 1957 or later. You may also need a second dose. For females of childbearing age, rubella immunity should be determined. If there is no evidence of immunity, females who are not pregnant should be vaccinated. If there is no evidence of immunity, females who are pregnant should delay immunization until after pregnancy.  Pneumococcal 13-valent conjugate (PCV13) vaccine.** / Consult your health care provider.  Pneumococcal polysaccharide (PPSV23) vaccine.** / 1 to 2 doses if you smoke cigarettes or if   you have certain conditions.  Meningococcal vaccine.** / Consult your health care provider.  Hepatitis A vaccine.** / Consult your health care provider.  Hepatitis B vaccine.** / Consult your health care provider.  Haemophilus influenzae type b (Hib) vaccine.** / Consult your health care provider. Ages 32 years and over  Blood pressure check.** / Every year.  Lipid and cholesterol check.** / Every 5 years beginning at age 31  years.  Lung cancer screening. / Every year if you are aged 39-80 years and have a 30-pack-year history of smoking and currently smoke or have quit within the past 15 years. Yearly screening is stopped once you have quit smoking for at least 15 years or develop a health problem that would prevent you from having lung cancer treatment.  Clinical breast exam.** / Every year after age 79 years.  BRCA-related cancer risk assessment.** / For women who have family members with a BRCA-related cancer (breast, ovarian, tubal, or peritoneal cancers).  Mammogram.** / Every year beginning at age 35 years and continuing for as long as you are in good health. Consult with your health care provider.  Pap test.** / Every 3 years starting at age 11 years through age 35 or 48 years with 3 consecutive normal Pap tests. Testing can be stopped between 65 and 70 years with 3 consecutive normal Pap tests and no abnormal Pap or HPV tests in the past 10 years.  HPV screening.** / Every 3 years from ages 50 years through ages 4 or 70 years with a history of 3 consecutive normal Pap tests. Testing can be stopped between 65 and 70 years with 3 consecutive normal Pap tests and no abnormal Pap or HPV tests in the past 10 years.  Fecal occult blood test (FOBT) of stool. / Every year beginning at age 73 years and continuing until age 30 years. You may not need to do this test if you get a colonoscopy every 10 years.  Flexible sigmoidoscopy or colonoscopy.** / Every 5 years for a flexible sigmoidoscopy or every 10 years for a colonoscopy beginning at age 3 years and continuing until age 68 years.  Hepatitis C blood test.** / For all people born from [birth year] through 1963 and any individual with known risks for hepatitis C.  Osteoporosis screening.** / A one-time screening for women ages 29 years and over and women at risk for fractures or osteoporosis.  Skin self-exam. / Monthly.  Influenza vaccine. / Every year.  Tetanus,  diphtheria, and acellular pertussis (Tdap/Td) vaccine.** / 1 dose of Td every 10 years.  Varicella vaccine.** / Consult your health care provider.  Zoster vaccine.** / 1 dose for adults aged 8 years or older.  Pneumococcal 13-valent conjugate (PCV13) vaccine.** / Consult your health care provider.  Pneumococcal polysaccharide (PPSV23) vaccine.** / 1 dose for all adults aged 55 years and older.  Meningococcal vaccine.** / Consult your health care provider.  Hepatitis A vaccine.** / Consult your health care provider.  Hepatitis B vaccine.** / Consult your health care provider.  Haemophilus influenzae type b (Hib) vaccine.** / Consult your health care provider. ** Family history and personal history of risk and conditions may change your health care provider's recommendations.   This information is not intended to replace advice given to you by your health care provider. Make sure you discuss any questions you have with your health care provider.   Document Released: 03/06/2001 Document Revised: 01/29/2014 Document Reviewed: 06/05/2010 Elsevier Interactive Patient Education [employer].

## 2015-06-02 NOTE — Telephone Encounter (Signed)
Faxed medical request form to pt's eye dr. [phone]

## 2015-06-02 NOTE — Progress Notes (Signed)
Subjective:   Jessica Dennis is a 82 y.o. female who presents for Medicare Annual (Subsequent) preventive examination.  Review of Systems:   Review of Systems  Constitutional: Negative for activity change, appetite change and fatigue.  HENT: Negative for hearing loss, congestion, tinnitus and ear discharge.   Eyes: Negative for visual disturbance (see optho q1y -- vision corrected to 20/20 with glasses).  Respiratory: Negative for cough, chest tightness and shortness of breath.   Cardiovascular: Negative for chest pain, palpitations and leg swelling.  Gastrointestinal: Negative for abdominal pain, diarrhea, constipation and abdominal distention.  Genitourinary: Negative for urgency, frequency, decreased urine volume and difficulty urinating.  Musculoskeletal: Negative for back pain, arthralgias and gait problem.  Skin: Negative for color change, pallor and rash.  Neurological: Negative for dizziness, light-headedness, numbness and headaches.  Hematological: Negative for adenopathy. Does not bruise/bleed easily.  Psychiatric/Behavioral: Negative for suicidal ideas, confusion, sleep disturbance, self-injury, dysphoric mood, decreased concentration and agitation.  Pt is able to read and write and can do all ADLs No risk for falling No abuse/ violence in home           Objective:     Vitals: BP 142/76 mmHg  Pulse 83  Temp(Src) 97.7 F (36.5 C) (Oral)  Ht 5\' 8"  (1.727 m)  Wt 167 lb 12.8 oz (76.114 kg)  BMI 25.52 kg/m2  SpO2 96%  Body mass index is 25.52 kg/(m^2).  BP 142/76 mmHg  Pulse 83  Temp(Src) 97.7 F (36.5 C) (Oral)  Ht 5\' 8"  (1.727 m)  Wt 167 lb 12.8 oz (76.114 kg)  BMI 25.52 kg/m2  SpO2 96% General appearance: alert, cooperative, appears stated age and no distress Head: Normocephalic, without obvious abnormality, atraumatic Eyes: conjunctivae/corneas clear. PERRL, EOM's intact. Fundi benign. Ears: normal TM's and external ear canals both ears Nose: Nares  normal. Septum midline. Mucosa normal. No drainage or sinus tenderness. Throat: lips, mucosa, and tongue normal; teeth and gums normal Neck: no adenopathy, no carotid bruit, no JVD, supple, symmetrical, trachea midline and thyroid not enlarged, symmetric, no tenderness/mass/nodules Back: symmetric, no curvature. ROM normal. No CVA tenderness. Lungs: clear to auscultation bilaterally Breasts: normal appearance, no masses or tenderness Heart: regular rate and rhythm, S1, S2 normal, no murmur, click, rub or gallop Abdomen: soft, non-tender; bowel sounds normal; no masses,  no organomegaly Pelvic: not indicated; status post hysterectomy, negative ROS Extremities: extremities normal, atraumatic, no cyanosis or edema Pulses: 2+ and symmetric Skin: Skin color, texture, turgor normal. No rashes or lesions Lymph nodes: Cervical, supraclavicular, and axillary nodes normal. Neurologic: Alert and oriented X 3, normal strength and tone. Normal symmetric reflexes. Normal coordination and gait Tobacco History  Smoking status  . Never Smoker   Smokeless tobacco  . Never Used    Comment: NEVER USED TOBACCO     Counseling given: Not Answered   Past Medical History  Diagnosis Date  . Anemia   . Anxiety   . Arthritis   . Hypertension   . Thyroid disease   . Borderline diabetes   . Diabetes mellitus without complication ([place])   . Cancer of the skin, basal cell 09/03/2012  . Cirrhosis ([place])   . GERD (gastroesophageal reflux disease)   . Diverticular disease   . Angiodysplasia of ascending colon 10/25/2014  . Iron deficiency anemia due to chronic blood loss 01/19/2015  . Occult GI bleeding 01/19/2015  . Bleeding gastric varices 01/19/2015   Past Surgical History  Procedure Laterality Date  . Abdominal hysterectomy    .  Total hip arthroplasty Bilateral 1993, 2006  . Appendectomy    . Tonsillectomy    . Colonoscopy    . Leg skin lesion  biopsy / excision  12/11/14   Family History  Problem  Relation Age of Onset  . Bladder Cancer Father   . Heart attack Father   . Diabetes Mother    History  Sexual Activity  . Sexual Activity: No    Outpatient Encounter Prescriptions as of 06/02/2015  Medication Sig  . ACCU-CHEK FASTCLIX LANCETS MISC Check Blood sugar once daily. Dx:E11.9  . Ascorbic Acid (VITAMIN C) 1000 MG tablet Take 1,000 mg by mouth daily.  . Calcium Carbonate-Vitamin D 600-200 MG-UNIT CAPS Take by mouth.  . ferrous sulfate 325 (65 FE) MG tablet Take 325 mg by mouth.  . furosemide (LASIX) 20 MG tablet Take 1 tablet (20 mg total) by mouth daily.  Marland Kitchen KLOR-CON M20 20 MEQ tablet TAKE 1 TABLET BY MOUTH DAILY  . latanoprost (XALATAN) 0.005 % ophthalmic solution Place 1 drop into both eyes at bedtime.   Marland Kitchen levothyroxine (SYNTHROID, LEVOTHROID) 75 MCG tablet TAKE ONE TABLET BY MOUTH DAILY  . LORazepam (ATIVAN) 0.5 MG tablet TAKE 1 TABLET BY MOUTH DAILY AS NEEDED  . meloxicam (MOBIC) 7.5 MG tablet TAKE ONE TABLET BY MOUTH TWO TIMES A DAY  . metFORMIN (GLUCOPHAGE-XR) 500 MG 24 hr tablet TAKE 1 TABLET BY MOUTH 2 TIMES DAILY.  Marland Kitchen Omega-3 Fatty Acids (FISH OIL) 1000 MG CAPS Take by mouth every morning.  Glory Rosebush DELICA LANCETS FINE MISC Check blood sugar once daily. Dx:E11.9  . ONETOUCH VERIO test strip Check blood sugar once daily. Dx:E11.9  . pantoprazole (PROTONIX) 40 MG tablet Take 1 tablet by mouth  daily  . Prednicarbate 0.1 % CREA Apply topically 2 (two) times daily.   . quinapril (ACCUPRIL) 40 MG tablet Take 1 tablet (40 mg total) by mouth daily.   No facility-administered encounter medications on file as of 06/02/2015.    Activities of Daily Living In your present state of health, do you have any difficulty performing the following activities: 06/02/2015 08/20/2014  Hearing? N N  Vision? N N  Difficulty concentrating or making decisions? N N  Walking or climbing stairs? N N  Dressing or bathing? N N  Doing errands, shopping? N N    Patient Care Team: Ann Held, DO as PCP - General (Family Medicine) Linward Natal, MD as Referring Physician (Ophthalmology) Sheryn Bison, MD as Referring Physician (Dermatology) Gatha Mayer, MD as Consulting Physician (Gastroenterology) Volanda Napoleon, MD as Consulting Physician (Oncology) Janan Ridge, MD as Consulting Physician (Dermatology) Paralee Cancel, MD as Consulting Physician (Orthopedic Surgery)    Assessment:    cpe Exercise Activities and Dietary recommendations Current Exercise Habits: The patient does not participate in regular exercise at present, Exercise limited by: Other - see comments (has not exercised since mohs surgery)  Goals    None     Fall Risk Fall Risk  06/02/2015 05/23/2015 03/23/2015 03/09/2015 01/19/2015  Falls in the past year? No No Yes Yes No  Number falls in past yr: - - 1 1 -  Injury with Fall? - - Yes Yes -  Follow up - - - Falls evaluation completed -   Depression Screen PHQ 2/9 Scores 06/02/2015 03/09/2015 04/12/2014 03/24/2013  PHQ - 2 Score 0 0 2 0  PHQ- 9 Score - - 3 -     Cognitive Testing No flowsheet data found.  Immunization History  Administered Date(s) Administered  . Hepatitis B, adult 05/12/2014  . Hepatitis B, ped/adol 08/10/2014  . Influenza, High Dose Seasonal PF 10/13/2014  . Influenza, Seasonal, Injecte, Preservative Fre 10/13/2014  . Influenza-Unspecified 11/25/2013  . Pneumococcal Conjugate-13 05/17/2014  . Tdap 10/02/2011  . Zoster 01/22/2005   Screening Tests Health Maintenance  Topic Date Due  . OPHTHALMOLOGY EXAM  03/02/2015  . HEMOGLOBIN A1C  05/03/2015  . FOOT EXAM  05/12/2015  . INFLUENZA VACCINE  08/23/2015  . MAMMOGRAM  03/30/2016  . TETANUS/TDAP  10/01/2021  . DEXA SCAN  Completed  . ZOSTAVAX  Addressed  . PNA vac Low Risk Adult  Addressed      Plan:    see AVS During the course of the visit the patient was educated and counseled about the following appropriate screening and preventive services:    Vaccines to include Pneumoccal, Influenza, Hepatitis B, Td, Zostavax, HCV  Electrocardiogram  Cardiovascular Disease  Colorectal cancer screening  Bone density screening  Diabetes screening  Glaucoma screening  Mammography/PAP  Nutrition counseling   Patient Instructions (the written plan) was given to the patient.  1. Hyperglycemia Check labs - Hemoglobin A1C - Comprehensive metabolic panel - CBC with Differential/Platelet - Lipid panel - POCT urinalysis dipstick - TSH - Microalbumin / creatinine urine ratio  2. Preventative health care  - Urinalysis - Hemoglobin A1C  3. Uncontrolled type 2 diabetes mellitus with ketoacidosis without coma, without long-term current use of insulin (HCC)   - Hemoglobin A1C - Comprehensive metabolic panel - CBC with Differential/Platelet - Lipid panel - POCT urinalysis dipstick - TSH - Microalbumin / creatinine urine rati  4. Hypothyroidism, unspecified hypothyroidism type Check labs - TSH  5. Essential hypertension stable - quinapril (ACCUPRIL) 40 MG tablet; Take 1 tablet (40 mg total) by mouth daily.  Dispense: 90 tablet; Refill: 1 - furosemide (LASIX) 20 MG tablet; Take 1 tablet (20 mg total) by mouth daily.  Dispense: 90 tablet; Refill: 1 - potassium chloride SA (KLOR-CON M20) 20 MEQ tablet; Take 1 tablet (20 mEq total) by mouth daily.  Dispense: 90 tablet; Refill: 1  6. Primary osteoarthritis of both knees   - meloxicam (MOBIC) 7.5 MG tablet; Take 1 tablet (7.5 mg total) by mouth 2 (two) times daily.  Dispense: 180 tablet; Refill: 1  7. Gastroesophageal reflux disease, esophagitis presence not specified  - pantoprazole (PROTONIX) 40 MG tablet; Take 1 tablet by mouth  daily  Dispense: 90 tablet; Refill: 1  8. Generalized anxiety disorder   - LORazepam (ATIVAN) 0.5 MG tablet; Take 1 tablet (0.5 mg total) by mouth daily as needed.  Dispense: 90 tablet; Refill: 0  9. Routine history and physical examination of  adult     Ann Held, DO  06/02/2015

## 2015-06-08 ENCOUNTER — Telehealth: Payer: Self-pay | Admitting: *Deleted

## 2015-06-08 DIAGNOSIS — R739 Hyperglycemia, unspecified: Secondary | ICD-10-CM

## 2015-06-08 DIAGNOSIS — E785 Hyperlipidemia, unspecified: Secondary | ICD-10-CM

## 2015-06-08 MED ORDER — SIMVASTATIN 20 MG PO TABS: 20.0000 mg | ORAL_TABLET | Freq: Every day | ORAL | Status: DC

## 2015-06-08 NOTE — Telephone Encounter (Signed)
-----   Message from Ann Held, DO sent at 06/08/2015 10:40 AM EDT ----- Forward to hematology Cholesterol--- LDL goal < 70,  HDL >40,  TG < 150.  Diet and exercise will increase HDL and decrease LDL and TG.  Fish,  Fish Oil, Flaxseed oil will also help increase the HDL and decrease Triglycerides.   Recheck labs in 3 months---  Start zocor 20 mg #30  1 each night, 2 refills DM is controlled-- con't meds . Lipid, cmp, hgba1c

## 2015-06-08 NOTE — Telephone Encounter (Signed)
Called and spoke with the pt and informed her of recent lab results and note.  Pt verbalized understanding and agreed.  New prescription sent to the pharmacy by e-script.  Future labs ordered and sent.  Pt scheduled a lab appt to recheck labs in 3 months on (Thurs-09-08-15 @ 11:00am)  Recent lab results sent to Dr Collier Salina Ennever.//AB/CMA

## 2015-06-13 ENCOUNTER — Telehealth: Payer: Self-pay | Admitting: Family Medicine

## 2015-06-13 MED ORDER — METFORMIN HCL ER 500 MG PO TB24: ORAL_TABLET | ORAL | Status: DC

## 2015-06-13 NOTE — Telephone Encounter (Signed)
Last filled: 05/05/15 Amt:  60, 0 Last OV/labs: 06/02/15 Med Filled.

## 2015-06-13 NOTE — Telephone Encounter (Signed)
Caller name: Keymora Relation to pt: Self Call back number: [phone] Pharmacy: [facility]  Reason for call: Pt is requesting refill on Metformin HCL ER 500 mg tab. Pt states needs ASAP, she only has one pill for today and is needing soon. Please advise.

## 2015-06-14 MED FILL — METFORMIN HCL ER 500 MG TAB: 500 | 30 days supply | Qty: 60 | Fill #0

## 2015-06-17 NOTE — Telephone Encounter (Signed)
Medical records received from [facility] Dr. Tora Kindred. Forwarded to Dr. Cydney Ok. JG//CMA

## 2015-06-24 DIAGNOSIS — Z85828 Personal history of other malignant neoplasm of skin: Secondary | ICD-10-CM | POA: Diagnosis not present

## 2015-06-24 DIAGNOSIS — L905 Scar conditions and fibrosis of skin: Secondary | ICD-10-CM | POA: Diagnosis not present

## 2015-06-24 DIAGNOSIS — Z08 Encounter for follow-up examination after completed treatment for malignant neoplasm: Secondary | ICD-10-CM | POA: Diagnosis not present

## 2015-06-24 DIAGNOSIS — L570 Actinic keratosis: Secondary | ICD-10-CM | POA: Diagnosis not present

## 2015-07-06 ENCOUNTER — Encounter: Payer: Self-pay | Admitting: Family Medicine

## 2015-07-06 NOTE — Telephone Encounter (Addendum)
Eye exams from 2011 until 2016 ave been received. The most recent Eye exam has been updated in HM.      KP

## 2015-07-11 MED FILL — METFORMIN HCL ER 500 MG TAB: 500 | 30 days supply | Qty: 60 | Fill #1

## 2015-07-18 ENCOUNTER — Other Ambulatory Visit: Payer: Self-pay | Admitting: Family Medicine

## 2015-07-18 DIAGNOSIS — M170 Bilateral primary osteoarthritis of knee: Secondary | ICD-10-CM | POA: Diagnosis not present

## 2015-07-19 DIAGNOSIS — H401132 Primary open-angle glaucoma, bilateral, moderate stage: Secondary | ICD-10-CM | POA: Diagnosis not present

## 2015-07-22 DIAGNOSIS — M170 Bilateral primary osteoarthritis of knee: Secondary | ICD-10-CM | POA: Diagnosis not present

## 2015-07-27 DIAGNOSIS — M170 Bilateral primary osteoarthritis of knee: Secondary | ICD-10-CM | POA: Diagnosis not present

## 2015-08-01 DIAGNOSIS — M170 Bilateral primary osteoarthritis of knee: Secondary | ICD-10-CM | POA: Diagnosis not present

## 2015-08-05 DIAGNOSIS — M170 Bilateral primary osteoarthritis of knee: Secondary | ICD-10-CM | POA: Diagnosis not present

## 2015-08-09 MED FILL — METFORMIN HCL ER 500 MG TAB: 500 | 30 days supply | Qty: 60 | Fill #2

## 2015-08-12 DIAGNOSIS — M170 Bilateral primary osteoarthritis of knee: Secondary | ICD-10-CM | POA: Diagnosis not present

## 2015-08-15 DIAGNOSIS — M170 Bilateral primary osteoarthritis of knee: Secondary | ICD-10-CM | POA: Diagnosis not present

## 2015-08-26 DIAGNOSIS — M170 Bilateral primary osteoarthritis of knee: Secondary | ICD-10-CM | POA: Diagnosis not present

## 2015-08-29 DIAGNOSIS — M170 Bilateral primary osteoarthritis of knee: Secondary | ICD-10-CM | POA: Diagnosis not present

## 2015-09-02 ENCOUNTER — Other Ambulatory Visit: Payer: Self-pay

## 2015-09-02 DIAGNOSIS — M170 Bilateral primary osteoarthritis of knee: Secondary | ICD-10-CM | POA: Diagnosis not present

## 2015-09-02 DIAGNOSIS — F411 Generalized anxiety disorder: Secondary | ICD-10-CM

## 2015-09-02 MED ORDER — LORAZEPAM 0.5 MG PO TABS: 0.5000 mg | ORAL_TABLET | Freq: Every day | ORAL | 0 refills | Status: DC | PRN

## 2015-09-02 NOTE — Telephone Encounter (Signed)
Refill x1 

## 2015-09-02 NOTE — Telephone Encounter (Signed)
Last seen and filled 06/02/15 #90   Please advise    KP

## 2015-09-05 DIAGNOSIS — M170 Bilateral primary osteoarthritis of knee: Secondary | ICD-10-CM | POA: Diagnosis not present

## 2015-09-06 ENCOUNTER — Other Ambulatory Visit: Payer: Self-pay | Admitting: Family Medicine

## 2015-09-08 ENCOUNTER — Other Ambulatory Visit (INDEPENDENT_AMBULATORY_CARE_PROVIDER_SITE_OTHER): Payer: PPO

## 2015-09-08 DIAGNOSIS — E785 Hyperlipidemia, unspecified: Secondary | ICD-10-CM

## 2015-09-08 DIAGNOSIS — R739 Hyperglycemia, unspecified: Secondary | ICD-10-CM | POA: Diagnosis not present

## 2015-09-08 LAB — COMPREHENSIVE METABOLIC PANEL
ALT: 25 U/L (ref 0–35)
AST: 34 U/L (ref 0–37)
Albumin: 4.1 g/dL (ref 3.5–5.2)
Alkaline Phosphatase: 94 U/L (ref 39–117)
BUN: 15 mg/dL (ref 6–23)
CO2: 27 mEq/L (ref 19–32)
Calcium: 9.7 mg/dL (ref 8.4–10.5)
Chloride: 101 mEq/L (ref 96–112)
Creatinine, Ser: 0.66 mg/dL (ref 0.40–1.20)
GFR: 91.42 mL/min (ref >60.00)
Glucose, Bld: 218 mg/dL — ABNORMAL HIGH (ref 70–99)
Potassium: 3.8 mEq/L (ref 3.5–5.1)
Sodium: 137 mEq/L (ref 135–145)
Total Bilirubin: 1.5 mg/dL — ABNORMAL HIGH (ref 0.2–1.2)
Total Protein: 6.4 g/dL (ref 6.0–8.3)

## 2015-09-08 LAB — LIPID PANEL
Cholesterol: 152 mg/dL (ref 0–200)
HDL: 62 mg/dL (ref >39.00)
LDL Cholesterol: 71 mg/dL (ref 0–99)
NonHDL: 90.26
Total CHOL/HDL Ratio: 2
Triglycerides: 95 mg/dL (ref 0.0–149.0)
VLDL: 19 mg/dL (ref 0.0–40.0)

## 2015-09-08 LAB — HEMOGLOBIN A1C: Hgb A1c MFr Bld: 5.7 % (ref 4.6–6.5)

## 2015-09-08 MED FILL — METFORMIN HCL ER 500 MG TAB: 500 | 30 days supply | Qty: 60 | Fill #3

## 2015-09-15 DIAGNOSIS — M170 Bilateral primary osteoarthritis of knee: Secondary | ICD-10-CM | POA: Diagnosis not present

## 2015-09-19 ENCOUNTER — Ambulatory Visit (HOSPITAL_BASED_OUTPATIENT_CLINIC_OR_DEPARTMENT_OTHER): Payer: PPO | Admitting: Family

## 2015-09-19 ENCOUNTER — Other Ambulatory Visit (HOSPITAL_BASED_OUTPATIENT_CLINIC_OR_DEPARTMENT_OTHER): Payer: PPO

## 2015-09-19 ENCOUNTER — Encounter: Payer: Self-pay | Admitting: Family

## 2015-09-19 VITALS — BP 137/67 | HR 80 | Temp 97.9°F | Resp 18 | Ht 68.0 in | Wt 169.0 lb

## 2015-09-19 DIAGNOSIS — D509 Iron deficiency anemia, unspecified: Secondary | ICD-10-CM | POA: Diagnosis not present

## 2015-09-19 DIAGNOSIS — D6959 Other secondary thrombocytopenia: Secondary | ICD-10-CM | POA: Diagnosis not present

## 2015-09-19 DIAGNOSIS — K760 Fatty (change of) liver, not elsewhere classified: Secondary | ICD-10-CM

## 2015-09-19 DIAGNOSIS — D500 Iron deficiency anemia secondary to blood loss (chronic): Secondary | ICD-10-CM

## 2015-09-19 DIAGNOSIS — K922 Gastrointestinal hemorrhage, unspecified: Secondary | ICD-10-CM

## 2015-09-19 DIAGNOSIS — D696 Thrombocytopenia, unspecified: Secondary | ICD-10-CM

## 2015-09-19 DIAGNOSIS — I864 Gastric varices: Secondary | ICD-10-CM | POA: Diagnosis not present

## 2015-09-19 DIAGNOSIS — R195 Other fecal abnormalities: Secondary | ICD-10-CM | POA: Diagnosis not present

## 2015-09-19 LAB — CBC WITH DIFFERENTIAL (CANCER CENTER ONLY)
BASO#: 0 10*3/uL (ref 0.0–0.2)
BASO%: 0.4 % (ref 0.0–2.0)
EOS%: 2.3 % (ref 0.0–7.0)
Eosinophils Absolute: 0.1 10*3/uL (ref 0.0–0.5)
HCT: 32.1 % — ABNORMAL LOW (ref 34.8–46.6)
HGB: 11.4 g/dL — ABNORMAL LOW (ref 11.6–15.9)
LYMPH#: 0.8 10*3/uL — ABNORMAL LOW (ref 0.9–3.3)
LYMPH%: 16.5 % (ref 14.0–48.0)
MCH: 34.8 pg — ABNORMAL HIGH (ref 26.0–34.0)
MCHC: 35.5 g/dL (ref 32.0–36.0)
MCV: 98 fL (ref 81–101)
MONO#: 0.4 10*3/uL (ref 0.1–0.9)
MONO%: 7.6 % (ref 0.0–13.0)
NEUT#: 3.6 10*3/uL (ref 1.5–6.5)
NEUT%: 73.2 % (ref 39.6–80.0)
Platelets: 57 10*3/uL — ABNORMAL LOW (ref 145–400)
RBC: 3.28 10*6/uL — ABNORMAL LOW (ref 3.70–5.32)
RDW: 14.1 % (ref 11.1–15.7)
WBC: 4.9 10*3/uL (ref 3.9–10.0)

## 2015-09-19 LAB — CHCC SATELLITE - SMEAR

## 2015-09-19 NOTE — Progress Notes (Signed)
Hematology and Oncology Follow Up Visit  Jessica Dennis [id number] [birth year] 82 y.o. 09/19/2015   Principle Diagnosis:  Chronic thrombocytopenia secondary to hepato-steatosis and splenomegaly Iron deficiency anemia secondary to GI bleed   Current Therapy:   Observation    Interim History:  Jessica Dennis is here today for a follow-up. She is doing fairly well. She has had some issues with dizziness and problems with balance. She has fallen twice outside after losing her footing. She has a large bruise on her left upper arm that is healing. Thankfully, she has not been seriously injured. She questions whether these issues are due to her blood sugars.  She states that her morning blood sugars (fasting) have been in the 200's. She states that her Hgb A1c is 5.7. She plans to speak with her PCP regarding a referral to endocrinology.  She responded nicely to the Feraheme infusion she received in January. Her Hgb is up at 11.4 with an MCV of 98. Her iron saturation in May was 26% with a ferritin of 46.  She has had no episodes of bleeding or petechiae. Platelet count is 57.  No fever, chills, n/v, cough, rash, headaches, blurred vision, SOB, chest pain, palpitations, abdominal pain or changes in bowel or bladder habits.  She has some swelling around her right ankle where she had the Mohs procedure and that same area is still numb.  No lymphadenopathy found on exam.  She is eating healthy and staying well hydrated. Her weight is stable.   Medications:    Medication List       Accurate as of 09/19/15  3:25 PM. Always use your most recent med list.          ACCU-CHEK FASTCLIX LANCETS Misc Check Blood sugar once daily. [id number]   ONETOUCH DELICA LANCETS FINE Misc Check blood sugar once daily. Dx:E11.9   Calcium Carbonate-Vitamin D 600-200 MG-UNIT Caps Take by mouth.   ferrous sulfate 325 (65 FE) MG tablet Take 325 mg by mouth.   Fish Oil 1000 MG Caps Take by mouth every morning.     furosemide 20 MG tablet Commonly known as:  LASIX Take 1 tablet (20 mg total) by mouth daily.   latanoprost 0.005 % ophthalmic solution Commonly known as:  XALATAN Place 1 drop into both eyes at bedtime.   levothyroxine 75 MCG tablet Commonly known as:  SYNTHROID, LEVOTHROID TAKE ONE TABLET BY MOUTH DAILY   LORazepam 0.5 MG tablet Commonly known as:  ATIVAN Take 1 tablet (0.5 mg total) by mouth daily as needed.   meloxicam 7.5 MG tablet Commonly known as:  MOBIC Take 1 tablet (7.5 mg total) by mouth 2 (two) times daily.   metFORMIN 500 MG 24 hr tablet Commonly known as:  GLUCOPHAGE-XR TAKE 1 TABLET BY MOUTH 2 TIMES DAILY.   ONETOUCH VERIO test strip Generic drug:  glucose blood Check blood sugar once daily. Dx:E11.9   pantoprazole 40 MG tablet Commonly known as:  PROTONIX Take 1 tablet by mouth  daily   potassium chloride SA 20 MEQ tablet Commonly known as:  KLOR-CON M20 Take 1 tablet (20 mEq total) by mouth daily.   Prednicarbate 0.1 % Crea Apply topically 2 (two) times daily.   quinapril 40 MG tablet Commonly known as:  ACCUPRIL Take 1 tablet (40 mg total) by mouth daily.   simvastatin 20 MG tablet Commonly known as:  ZOCOR TAKE ONE TABLET BY MOUTH AT BEDTIME   vitamin C 1000 MG tablet Take 1,000 mg by mouth  daily.       Allergies: No Known Allergies  Past Medical History, Surgical history, Social history, and Family History were reviewed and updated.  Review of Systems: All other 10 point review of systems is negative.   Physical Exam:  height is 5\' 8"  (1.727 m) and weight is 169 lb (76.7 kg). Her oral temperature is 97.9 F (36.6 C). Her blood pressure is 137/67 and her pulse is 80. Her respiration is 18.   Wt Readings from Last 3 Encounters:  09/19/15 169 lb (76.7 kg)  06/02/15 167 lb 12.8 oz (76.1 kg)  05/23/15 164 lb (74.4 kg)    Ocular: Sclerae unicteric, pupils equal, round and reactive to light Ear-nose-throat: Oropharynx clear,  dentition fair Lymphatic: No cervical supraclavicular or axillary adenopathy  Lungs no rales or rhonchi, good excursion bilaterally Heart regular rate and rhythm, no murmur appreciated Abd soft, nontender, positive bowel sounds, no liver or spleen tip palpated on exam MSK no focal spinal tenderness, no joint edema Neuro: non-focal, well-oriented, appropriate affect Breasts: Deferred  Lab Results  Component Value Date   WBC 4.9 09/19/2015   HGB 11.4 (L) 09/19/2015   HCT 32.1 (L) 09/19/2015   MCV 98 09/19/2015   PLT 57 (L) 09/19/2015   Lab Results  Component Value Date   FERRITIN 46 05/23/2015   IRON 74 05/23/2015   TIBC 290 05/23/2015   UIBC 216 05/23/2015   IRONPCTSAT 26 05/23/2015   Lab Results  Component Value Date   RBC 3.28 (L) 09/19/2015   No results found for: KPAFRELGTCHN, LAMBDASER, KAPLAMBRATIO No results found for: IGGSERUM, IGA, IGMSERUM No results found for: Kathrynn Ducking, MSPIKE, SPEI   Chemistry      Component Value Date/Time   NA 137 09/08/2015 1112   NA 139 05/23/2015 1421   NA 139 03/23/2015 1415   K 3.8 09/08/2015 1112   K 3.4 05/23/2015 1421   K 4.0 03/23/2015 1415   CL 101 09/08/2015 1112   CL 101 05/23/2015 1421   CO2 27 09/08/2015 1112   CO2 25 05/23/2015 1421   CO2 25 03/23/2015 1415   BUN 15 09/08/2015 1112   BUN 13 05/23/2015 1421   BUN 17.8 03/23/2015 1415   CREATININE 0.66 09/08/2015 1112   CREATININE 0.7 05/23/2015 1421   CREATININE 0.9 03/23/2015 1415      Component Value Date/Time   CALCIUM 9.7 09/08/2015 1112   CALCIUM 9.3 05/23/2015 1421   CALCIUM 9.5 03/23/2015 1415   ALKPHOS 94 09/08/2015 1112   ALKPHOS 93 (H) 05/23/2015 1421   ALKPHOS 100 03/23/2015 1415   AST 34 09/08/2015 1112   AST 37 05/23/2015 1421   AST 36 (H) 03/23/2015 1415   ALT 25 09/08/2015 1112   ALT 34 05/23/2015 1421   ALT 29 03/23/2015 1415   BILITOT 1.5 (H) 09/08/2015 1112   BILITOT 1.70 (H) 05/23/2015  1421   BILITOT 1.04 03/23/2015 1415     Impression and Plan: Jessica Dennis is a very charming 82 year old white female with chronic thrombocytopenia secondary to hepatic steatosis and splenomegaly. She has also had iron deficiency anemia due to intermittent GI bleed. She is still having some fatigue and dizziness. She plans to have her PCP refer her to endocrinology to better manage her blood sugars.  Her Hgb is stable at 11.4 with an MCV of 98. She has had no episodes of bleeding but does bruise quite easily.  We will see what her iron studies show  and plan to bring her back in later this week for an infusion if needed.  We will plan to see her back in 2 months for repeat labs and follow-up.  She knows to contact our office with any questions or concerns. We can certainly see her sooner if need be.   Eliezer Bottom, NP 8/28/20173:25 PM

## 2015-09-20 DIAGNOSIS — M170 Bilateral primary osteoarthritis of knee: Secondary | ICD-10-CM | POA: Diagnosis not present

## 2015-09-20 LAB — FERRITIN: Ferritin: 61 ng/ml (ref 9–269)

## 2015-09-20 LAB — IRON AND TIBC
%SAT: 29 % (ref 21–57)
Iron: 78 ug/dL (ref 41–142)
TIBC: 265 ug/dL (ref 236–444)
UIBC: 187 ug/dL (ref 120–384)

## 2015-09-20 LAB — RETICULOCYTES: Reticulocyte Count: 4.7 % — ABNORMAL HIGH (ref 0.6–2.6)

## 2015-09-21 ENCOUNTER — Encounter: Payer: Self-pay | Admitting: Family

## 2015-09-21 DIAGNOSIS — C4490 Unspecified malignant neoplasm of skin, unspecified: Secondary | ICD-10-CM | POA: Diagnosis not present

## 2015-09-21 DIAGNOSIS — C44702 Unspecified malignant neoplasm of skin of right lower limb, including hip: Secondary | ICD-10-CM | POA: Diagnosis not present

## 2015-09-22 DIAGNOSIS — M170 Bilateral primary osteoarthritis of knee: Secondary | ICD-10-CM | POA: Diagnosis not present

## 2015-09-28 DIAGNOSIS — M170 Bilateral primary osteoarthritis of knee: Secondary | ICD-10-CM | POA: Diagnosis not present

## 2015-09-30 DIAGNOSIS — M170 Bilateral primary osteoarthritis of knee: Secondary | ICD-10-CM | POA: Diagnosis not present

## 2015-09-30 DIAGNOSIS — M1711 Unilateral primary osteoarthritis, right knee: Secondary | ICD-10-CM | POA: Diagnosis not present

## 2015-09-30 DIAGNOSIS — Z0000 Encounter for general adult medical examination without abnormal findings: Secondary | ICD-10-CM | POA: Diagnosis not present

## 2015-10-03 DIAGNOSIS — M170 Bilateral primary osteoarthritis of knee: Secondary | ICD-10-CM | POA: Diagnosis not present

## 2015-10-03 MED FILL — ONE TOUCH ULTRA TEST STRIPS: 50 days supply | Qty: 50 | Fill #1

## 2015-10-10 MED FILL — METFORMIN HCL ER 500 MG TAB: 500 | 30 days supply | Qty: 60 | Fill #4

## 2015-11-16 MED FILL — METFORMIN HCL ER 500 MG TAB: 500 | 30 days supply | Qty: 60 | Fill #5

## 2015-11-25 ENCOUNTER — Other Ambulatory Visit (HOSPITAL_BASED_OUTPATIENT_CLINIC_OR_DEPARTMENT_OTHER): Payer: PPO

## 2015-11-25 ENCOUNTER — Ambulatory Visit (HOSPITAL_BASED_OUTPATIENT_CLINIC_OR_DEPARTMENT_OTHER): Payer: PPO | Admitting: Family

## 2015-11-25 VITALS — BP 146/66 | HR 86 | Temp 98.2°F | Resp 20 | Wt 166.0 lb

## 2015-11-25 DIAGNOSIS — R195 Other fecal abnormalities: Secondary | ICD-10-CM | POA: Diagnosis not present

## 2015-11-25 DIAGNOSIS — D696 Thrombocytopenia, unspecified: Secondary | ICD-10-CM

## 2015-11-25 DIAGNOSIS — R161 Splenomegaly, not elsewhere classified: Secondary | ICD-10-CM

## 2015-11-25 DIAGNOSIS — D500 Iron deficiency anemia secondary to blood loss (chronic): Secondary | ICD-10-CM

## 2015-11-25 DIAGNOSIS — K922 Gastrointestinal hemorrhage, unspecified: Secondary | ICD-10-CM | POA: Diagnosis not present

## 2015-11-25 DIAGNOSIS — I864 Gastric varices: Secondary | ICD-10-CM | POA: Diagnosis not present

## 2015-11-25 DIAGNOSIS — E559 Vitamin D deficiency, unspecified: Secondary | ICD-10-CM | POA: Diagnosis not present

## 2015-11-25 DIAGNOSIS — D509 Iron deficiency anemia, unspecified: Secondary | ICD-10-CM

## 2015-11-25 LAB — COMPREHENSIVE METABOLIC PANEL
ALT: 28 U/L (ref 0–55)
AST: 38 U/L — ABNORMAL HIGH (ref 5–34)
Albumin: 3.8 g/dL (ref 3.5–5.0)
Alkaline Phosphatase: 111 U/L (ref 40–150)
Anion Gap: 10 mEq/L (ref 3–11)
BUN: 11.9 mg/dL (ref 7.0–26.0)
CO2: 27 mEq/L (ref 22–29)
Calcium: 9.7 mg/dL (ref 8.4–10.4)
Chloride: 104 mEq/L (ref 98–109)
Creatinine: 0.7 mg/dL (ref 0.6–1.1)
EGFR: 82 mL/min/{1.73_m2} — ABNORMAL LOW (ref >90)
Glucose: 227 mg/dl — ABNORMAL HIGH (ref 70–140)
Potassium: 4 mEq/L (ref 3.5–5.1)
Sodium: 141 mEq/L (ref 136–145)
Total Bilirubin: 2.04 mg/dL — ABNORMAL HIGH (ref 0.20–1.20)
Total Protein: 6.6 g/dL (ref 6.4–8.3)

## 2015-11-25 LAB — CBC WITH DIFFERENTIAL (CANCER CENTER ONLY)
BASO#: 0 10*3/uL (ref 0.0–0.2)
BASO%: 0.4 % (ref 0.0–2.0)
EOS%: 3.7 % (ref 0.0–7.0)
Eosinophils Absolute: 0.2 10*3/uL (ref 0.0–0.5)
HCT: 36.9 % (ref 34.8–46.6)
HGB: 13.1 g/dL (ref 11.6–15.9)
LYMPH#: 0.9 10*3/uL (ref 0.9–3.3)
LYMPH%: 15.5 % (ref 14.0–48.0)
MCH: 34.6 pg — ABNORMAL HIGH (ref 26.0–34.0)
MCHC: 35.5 g/dL (ref 32.0–36.0)
MCV: 97 fL (ref 81–101)
MONO#: 0.4 10*3/uL (ref 0.1–0.9)
MONO%: 7.5 % (ref 0.0–13.0)
NEUT#: 4.1 10*3/uL (ref 1.5–6.5)
NEUT%: 72.9 % (ref 39.6–80.0)
Platelets: 65 10*3/uL — ABNORMAL LOW (ref 145–400)
RBC: 3.79 10*6/uL (ref 3.70–5.32)
RDW: 14.2 % (ref 11.1–15.7)
WBC: 5.6 10*3/uL (ref 3.9–10.0)

## 2015-11-25 LAB — CHCC SATELLITE - SMEAR

## 2015-11-25 NOTE — Progress Notes (Signed)
Hematology and Oncology Follow Up Visit  Jessica Dennis [id number] August 20, 1932 82 y.o. 11/25/2015   Principle Diagnosis:  Chronic thrombocytopenia secondary to hepato-steatosis and splenomegaly Iron deficiency anemia secondary to GI bleed   Current Therapy:   Observation    Interim History:  Jessica Dennis is here today for a follow-up. She is having some fatigue and lightheadedness. She did have a fall 3 weeks ago and bruised her right thigh. This is resolving. Thankfully she was not seriously injured.  She responded nicely to the [facility] she received earlier this year. Her iron saturation was 29% with a ferritin of 61.  She has had no episodes of bleeding or petechiae. No lymphadenopathy found on exam.  No fever, chills, n/v, cough, rash, headaches, blurred vision, ice cravings, SOB, chest pain, palpitations, abdominal pain or changes in bowel or bladder habits.  She has some chronic swelling and numbness around her right ankle where she had the Mohs procedure several years ago.  She is eating healthy and staying well hydrated. Her weight is stable. Her blood sugars have been fairly well controlled. She is concerned that her blood glucose in the mornings is over 200. Her Hgb A1c in August was 5.7. She plans to talk to her PCP about this next week.   Medications:    Medication List       Accurate as of 11/25/15 11:48 AM. Always use your most recent med list.          ACCU-CHEK FASTCLIX LANCETS Misc Check Blood sugar once daily. 123XX123   ONETOUCH DELICA LANCETS FINE Misc Check blood sugar once daily. Dx:E11.9   Calcium Carbonate-Vitamin D 600-200 MG-UNIT Caps Take by mouth.   ferrous sulfate 325 (65 FE) MG tablet Take 325 mg by mouth.   Fish Oil 1000 MG Caps Take by mouth every morning.   furosemide 20 MG tablet Commonly known as:  LASIX Take 1 tablet (20 mg total) by mouth daily.   latanoprost 0.005 % ophthalmic solution Commonly known as:  XALATAN Place 1 drop into  both eyes at bedtime.   levothyroxine 75 MCG tablet Commonly known as:  SYNTHROID, LEVOTHROID TAKE ONE TABLET BY MOUTH DAILY   LORazepam 0.5 MG tablet Commonly known as:  ATIVAN Take 1 tablet (0.5 mg total) by mouth daily as needed.   meloxicam 7.5 MG tablet Commonly known as:  MOBIC Take 1 tablet (7.5 mg total) by mouth 2 (two) times daily.   metFORMIN 500 MG 24 hr tablet Commonly known as:  GLUCOPHAGE-XR TAKE 1 TABLET BY MOUTH 2 TIMES DAILY.   ONETOUCH VERIO test strip Generic drug:  glucose blood Check blood sugar once daily. Dx:E11.9   pantoprazole 40 MG tablet Commonly known as:  PROTONIX Take 1 tablet by mouth  daily   potassium chloride SA 20 MEQ tablet Commonly known as:  KLOR-CON M20 Take 1 tablet (20 mEq total) by mouth daily.   Prednicarbate 0.1 % Crea Apply topically 2 (two) times daily.   quinapril 40 MG tablet Commonly known as:  ACCUPRIL Take 1 tablet (40 mg total) by mouth daily.   simvastatin 20 MG tablet Commonly known as:  ZOCOR TAKE ONE TABLET BY MOUTH AT BEDTIME   vitamin C 1000 MG tablet Take 1,000 mg by mouth daily.       Allergies: No Known Allergies  Past Medical History, Surgical history, Social history, and Family History were reviewed and updated.  Review of Systems: All other 10 point review of systems is negative.  Physical Exam:  vitals were not taken for this visit.  Wt Readings from Last 3 Encounters:  09/19/15 169 lb (76.7 kg)  06/02/15 167 lb 12.8 oz (76.1 kg)  05/23/15 164 lb (74.4 kg)    Ocular: Sclerae unicteric, pupils equal, round and reactive to light Ear-nose-throat: Oropharynx clear, dentition fair Lymphatic: No cervical, supraclavicular or axillary adenopathy  Lungs no rales or rhonchi, good excursion bilaterally Heart regular rate and rhythm, no murmur appreciated Abd soft, nontender, positive bowel sounds, no liver or spleen tip palpated on exam, no fluid wave MSK no focal spinal tenderness, no joint  edema Neuro: non-focal, well-oriented, appropriate affect Breasts: Deferred  Lab Results  Component Value Date   WBC 4.9 09/19/2015   HGB 11.4 (L) 09/19/2015   HCT 32.1 (L) 09/19/2015   MCV 98 09/19/2015   PLT 57 (L) 09/19/2015   Lab Results  Component Value Date   FERRITIN 61 09/19/2015   IRON 78 09/19/2015   TIBC 265 09/19/2015   UIBC 187 09/19/2015   IRONPCTSAT 29 09/19/2015   Lab Results  Component Value Date   RBC 3.28 (L) 09/19/2015   No results found for: KPAFRELGTCHN, LAMBDASER, KAPLAMBRATIO No results found for: IGGSERUM, IGA, IGMSERUM No results found for: Kathrynn Ducking, MSPIKE, SPEI   Chemistry      Component Value Date/Time   NA 137 09/08/2015 1112   NA 139 05/23/2015 1421   NA 139 03/23/2015 1415   K 3.8 09/08/2015 1112   K 3.4 05/23/2015 1421   K 4.0 03/23/2015 1415   CL 101 09/08/2015 1112   CL 101 05/23/2015 1421   CO2 27 09/08/2015 1112   CO2 25 05/23/2015 1421   CO2 25 03/23/2015 1415   BUN 15 09/08/2015 1112   BUN 13 05/23/2015 1421   BUN 17.8 03/23/2015 1415   CREATININE 0.66 09/08/2015 1112   CREATININE 0.7 05/23/2015 1421   CREATININE 0.9 03/23/2015 1415      Component Value Date/Time   CALCIUM 9.7 09/08/2015 1112   CALCIUM 9.3 05/23/2015 1421   CALCIUM 9.5 03/23/2015 1415   ALKPHOS 94 09/08/2015 1112   ALKPHOS 93 (H) 05/23/2015 1421   ALKPHOS 100 03/23/2015 1415   AST 34 09/08/2015 1112   AST 37 05/23/2015 1421   AST 36 (H) 03/23/2015 1415   ALT 25 09/08/2015 1112   ALT 34 05/23/2015 1421   ALT 29 03/23/2015 1415   BILITOT 1.5 (H) 09/08/2015 1112   BILITOT 1.70 (H) 05/23/2015 1421   BILITOT 1.04 03/23/2015 1415     Impression and Plan: Jessica Dennis is a very pleasant 82 yo white female with chronic thrombocytopenia secondary to hepatic steatosis and splenomegaly. She also has iron deficiency anemia due to intermittent GI bleed. She is symptomatic at this time with fatigue. She has also  experienced some lightheadedness and had a fall several weeks ago.  Her Hgb is stable and platelet count is slightly improved at 65. No episodes of bleeding.  We will see what her iron studies show and plan to bring her back in later this week for an infusion if needed.  I also added a vit D level to her blood work to see if this may be low and contributing to her symptoms.  She also plans to follow-up with her PCP regarding her thyroid and having her TSH rechecked.  We will plan to see her back in 3 months for repeat labs and follow-up.  She knows to contact  our office with any questions or concerns. We can certainly see her sooner if need be.   Eliezer Bottom, NP 11/3/201711:48 AM

## 2015-11-26 LAB — RETICULOCYTES: Reticulocyte Count: 4.5 % — ABNORMAL HIGH (ref 0.6–2.6)

## 2015-11-26 LAB — VITAMIN D 25 HYDROXY (VIT D DEFICIENCY, FRACTURES): Vitamin D, 25-Hydroxy: 49.3 ng/mL (ref 30.0–100.0)

## 2015-11-28 LAB — IRON AND TIBC
%SAT: 56 % (ref 21–57)
Iron: 148 ug/dL — ABNORMAL HIGH (ref 41–142)
TIBC: 265 ug/dL (ref 236–444)
UIBC: 117 ug/dL — ABNORMAL LOW (ref 120–384)

## 2015-11-28 LAB — FERRITIN: Ferritin: 130 ng/ml (ref 9–269)

## 2015-11-29 DIAGNOSIS — H01002 Unspecified blepharitis right lower eyelid: Secondary | ICD-10-CM | POA: Diagnosis not present

## 2015-11-29 DIAGNOSIS — H01001 Unspecified blepharitis right upper eyelid: Secondary | ICD-10-CM | POA: Diagnosis not present

## 2015-12-05 ENCOUNTER — Ambulatory Visit (INDEPENDENT_AMBULATORY_CARE_PROVIDER_SITE_OTHER): Payer: PPO | Admitting: Family Medicine

## 2015-12-05 ENCOUNTER — Encounter: Payer: Self-pay | Admitting: Family Medicine

## 2015-12-05 VITALS — BP 134/70 | HR 81 | Temp 98.2°F | Ht 68.0 in | Wt 172.2 lb

## 2015-12-05 DIAGNOSIS — E1151 Type 2 diabetes mellitus with diabetic peripheral angiopathy without gangrene: Secondary | ICD-10-CM

## 2015-12-05 DIAGNOSIS — R8299 Other abnormal findings in urine: Secondary | ICD-10-CM

## 2015-12-05 DIAGNOSIS — E785 Hyperlipidemia, unspecified: Secondary | ICD-10-CM

## 2015-12-05 DIAGNOSIS — R5383 Other fatigue: Secondary | ICD-10-CM | POA: Diagnosis not present

## 2015-12-05 DIAGNOSIS — E039 Hypothyroidism, unspecified: Secondary | ICD-10-CM | POA: Diagnosis not present

## 2015-12-05 DIAGNOSIS — R82998 Other abnormal findings in urine: Secondary | ICD-10-CM

## 2015-12-05 DIAGNOSIS — Z79899 Other long term (current) drug therapy: Secondary | ICD-10-CM | POA: Diagnosis not present

## 2015-12-05 DIAGNOSIS — I10 Essential (primary) hypertension: Secondary | ICD-10-CM | POA: Diagnosis not present

## 2015-12-05 DIAGNOSIS — R42 Dizziness and giddiness: Secondary | ICD-10-CM | POA: Diagnosis not present

## 2015-12-05 DIAGNOSIS — K219 Gastro-esophageal reflux disease without esophagitis: Secondary | ICD-10-CM

## 2015-12-05 DIAGNOSIS — R8290 Unspecified abnormal findings in urine: Secondary | ICD-10-CM

## 2015-12-05 DIAGNOSIS — IMO0002 Reserved for concepts with insufficient information to code with codable children: Secondary | ICD-10-CM

## 2015-12-05 DIAGNOSIS — E1165 Type 2 diabetes mellitus with hyperglycemia: Secondary | ICD-10-CM | POA: Diagnosis not present

## 2015-12-05 LAB — POCT URINALYSIS DIPSTICK
Bilirubin, UA: NEGATIVE
Blood, UA: NEGATIVE
Glucose, UA: NEGATIVE
Ketones, UA: NEGATIVE
Nitrite, UA: NEGATIVE
Protein, UA: NEGATIVE
Spec Grav, UA: 1.025
Urobilinogen, UA: 0.2
pH, UA: 5.5

## 2015-12-05 LAB — LIPID PANEL
Cholesterol: 130 mg/dL (ref 0–200)
HDL: 60 mg/dL (ref >39.00)
LDL Cholesterol: 52 mg/dL (ref 0–99)
NonHDL: 70.37
Total CHOL/HDL Ratio: 2
Triglycerides: 94 mg/dL (ref 0.0–149.0)
VLDL: 18.8 mg/dL (ref 0.0–40.0)

## 2015-12-05 LAB — COMPREHENSIVE METABOLIC PANEL
ALT: 25 U/L (ref 0–35)
AST: 31 U/L (ref 0–37)
Albumin: 3.8 g/dL (ref 3.5–5.2)
Alkaline Phosphatase: 95 U/L (ref 39–117)
BUN: 12 mg/dL (ref 6–23)
CO2: 27 mEq/L (ref 19–32)
Calcium: 9 mg/dL (ref 8.4–10.5)
Chloride: 102 mEq/L (ref 96–112)
Creatinine, Ser: 0.6 mg/dL (ref 0.40–1.20)
GFR: 101.99 mL/min (ref >60.00)
Glucose, Bld: 305 mg/dL — ABNORMAL HIGH (ref 70–99)
Potassium: 3.8 mEq/L (ref 3.5–5.1)
Sodium: 138 mEq/L (ref 135–145)
Total Bilirubin: 1.5 mg/dL — ABNORMAL HIGH (ref 0.2–1.2)
Total Protein: 6 g/dL (ref 6.0–8.3)

## 2015-12-05 LAB — VITAMIN B12: Vitamin B-12: 1069 pg/mL — ABNORMAL HIGH (ref 211–911)

## 2015-12-05 LAB — TSH: TSH: 3.06 u[IU]/mL (ref 0.35–4.50)

## 2015-12-05 LAB — HEMOGLOBIN A1C: Hgb A1c MFr Bld: 6.3 % (ref 4.6–6.5)

## 2015-12-05 MED ORDER — FUROSEMIDE 20 MG PO TABS: 20.0000 mg | ORAL_TABLET | Freq: Every day | ORAL | 1 refills | Status: DC

## 2015-12-05 MED ORDER — METFORMIN HCL ER 500 MG PO TB24: ORAL_TABLET | ORAL | 5 refills | Status: DC

## 2015-12-05 MED ORDER — PANTOPRAZOLE SODIUM 40 MG PO TBEC: DELAYED_RELEASE_TABLET | ORAL | 1 refills | Status: DC

## 2015-12-05 MED ORDER — POTASSIUM CHLORIDE CRYS ER 20 MEQ PO TBCR: 20.0000 meq | EXTENDED_RELEASE_TABLET | Freq: Every day | ORAL | 1 refills | Status: DC

## 2015-12-05 MED ORDER — QUINAPRIL HCL 40 MG PO TABS: 40.0000 mg | ORAL_TABLET | Freq: Every day | ORAL | 1 refills | Status: DC

## 2015-12-05 MED FILL — POTASSIUM CL ER 20 MEQ TABL: 20 | 90 days supply | Qty: 90 | Fill #0

## 2015-12-05 NOTE — Patient Instructions (Signed)

## 2015-12-05 NOTE — Progress Notes (Signed)
Pre visit review using our clinic review tool, if applicable. No additional management support is needed unless otherwise documented below in the visit note. 

## 2015-12-05 NOTE — Progress Notes (Signed)
Patient ID: Jessica Dennis, female    DOB: 12-26-1932  Age: 82 y.o. MRN: [id number]    Subjective:  Subjective  HPI Jessica Dennis presents for dm  Cholesterol and bp.   HYPERTENSION   Blood pressure range-not checking   Chest pain- no      Dyspnea- no Lightheadedness- no   Edema- no  Other side effects - no   Medication compliance: good Low salt diet- yes    DIABETES    Blood Sugar ranges-200s  Polyuria- no New Visual problems- no  Hypoglycemic symptoms- no  Other side effects-no Medication compliance - good Last eye exam- last week Foot exam- today   HYPERLIPIDEMIA  Medication compliance- good RUQ pain- no  Muscle aches- no Other side effects-no   Review of Systems  Constitutional: Negative for appetite change, diaphoresis, fatigue and unexpected weight change.  Eyes: Negative for pain, redness and visual disturbance.  Respiratory: Negative for cough, chest tightness, shortness of breath and wheezing.   Cardiovascular: Negative for chest pain, palpitations and leg swelling.  Endocrine: Negative for cold intolerance, heat intolerance, polydipsia, polyphagia and polyuria.  Genitourinary: Negative for difficulty urinating, dysuria and frequency.  Neurological: Negative for dizziness, light-headedness, numbness and headaches.    History Past Medical History:  Diagnosis Date  . Anemia   . Angiodysplasia of ascending colon 10/25/2014  . Anxiety   . Arthritis   . Bleeding gastric varices 01/19/2015  . Borderline diabetes   . Cancer of the skin, basal cell 09/03/2012  . Cirrhosis ([place])   . Diabetes mellitus without complication ([place])   . Diverticular disease   . GERD (gastroesophageal reflux disease)   . Hypertension   . Iron deficiency anemia due to chronic blood loss 01/19/2015  . Occult GI bleeding 01/19/2015  . Thyroid disease     She has a past surgical history that includes Abdominal hysterectomy; Total hip arthroplasty (Bilateral, 1993, 2006);  Appendectomy; Tonsillectomy; Colonoscopy; and Leg skin lesion  biopsy / excision (12/11/14).   Her family history includes Bladder Cancer in her father; Diabetes in her mother; Heart attack in her father.She reports that she has never smoked. She has never used smokeless tobacco. She reports that she drinks about 1.2 oz of alcohol per week . She reports that she does not use drugs.  Current Outpatient Prescriptions on File Prior to Visit  Medication Sig Dispense Refill  . ACCU-CHEK FASTCLIX LANCETS MISC Check Blood sugar once daily. Dx:E11.9 50 each 12  . Ascorbic Acid (VITAMIN C) 1000 MG tablet Take 1,000 mg by mouth daily.    . Calcium Carbonate-Vitamin D 600-200 MG-UNIT CAPS Take by mouth.    . ferrous sulfate 325 (65 FE) MG tablet Take 325 mg by mouth.    . latanoprost (XALATAN) 0.005 % ophthalmic solution Place 1 drop into both eyes at bedtime.     Marland Kitchen levothyroxine (SYNTHROID, LEVOTHROID) 75 MCG tablet TAKE ONE TABLET BY MOUTH DAILY 30 tablet 11  . LORazepam (ATIVAN) 0.5 MG tablet Take 1 tablet (0.5 mg total) by mouth daily as needed. 90 tablet 0  . Melatonin 5 MG CAPS Take 5 mg by mouth.    . meloxicam (MOBIC) 7.5 MG tablet Take 1 tablet (7.5 mg total) by mouth 2 (two) times daily. 180 tablet 1  . Omega-3 Fatty Acids (FISH OIL) 1000 MG CAPS Take by mouth every morning.    Glory Rosebush DELICA LANCETS FINE MISC Check blood sugar once daily. Dx:E11.9 100 each 12  . ONETOUCH VERIO test strip  Check blood sugar once daily. Dx:E11.9 100 each 12  . Prednicarbate 0.1 % CREA Apply topically 2 (two) times daily.     . simvastatin (ZOCOR) 20 MG tablet TAKE ONE TABLET BY MOUTH AT BEDTIME 30 tablet 3   No current facility-administered medications on file prior to visit.      Objective:  Objective  Physical Exam  Constitutional: She is oriented to person, place, and time. She appears well-developed and well-nourished.  HENT:  Head: Normocephalic and atraumatic.  Eyes: Conjunctivae and EOM are  normal.  Neck: Normal range of motion. Neck supple. No JVD present. Carotid bruit is not present. No thyromegaly present.  Cardiovascular: Normal rate, regular rhythm and normal heart sounds.   No murmur heard. Pulmonary/Chest: Effort normal and breath sounds normal. No respiratory distress. She has no wheezes. She has no rales. She exhibits no tenderness.  Musculoskeletal: She exhibits no edema.  Neurological: She is alert and oriented to person, place, and time.  Psychiatric: She has a normal mood and affect.  Nursing note and vitals reviewed. Sensory exam of the foot is normal, tested with the monofilament. Good pulses, no lesions or ulcers, good peripheral pulses.  BP 134/70 (BP Location: Right Arm, Cuff Size: Small)   Pulse 81   Temp 98.2 F (36.8 C) (Oral)   Ht 5\' 8"  (1.727 m)   Wt 172 lb 3.2 oz (78.1 kg)   SpO2 96%   BMI 26.18 kg/m  Wt Readings from Last 3 Encounters:  12/05/15 172 lb 3.2 oz (78.1 kg)  11/25/15 166 lb (75.3 kg)  09/19/15 169 lb (76.7 kg)     Lab Results  Component Value Date   WBC 5.6 11/25/2015   HGB 13.1 11/25/2015   HCT 36.9 11/25/2015   PLT 65 (L) 11/25/2015   GLUCOSE 227 (H) 11/25/2015   CHOL 152 09/08/2015   TRIG 95.0 09/08/2015   HDL 62.00 09/08/2015   LDLCALC 71 09/08/2015   ALT 28 11/25/2015   AST 38 (H) 11/25/2015   NA 141 11/25/2015   K 4.0 11/25/2015   CL 101 09/08/2015   CREATININE 0.7 11/25/2015   BUN 11.9 11/25/2015   CO2 27 11/25/2015   TSH 1.96 06/02/2015   INR 1.3 (H) 08/10/2014   HGBA1C 5.7 09/08/2015   MICROALBUR 0.2 06/02/2015    No results found.   Assessment & Plan:  Plan  I am having Ms. Madole maintain her Prednicarbate, latanoprost, Fish Oil, vitamin C, ACCU-CHEK FASTCLIX LANCETS, ONETOUCH VERIO, ONETOUCH DELICA LANCETS FINE, ferrous sulfate, Calcium Carbonate-Vitamin D, meloxicam, levothyroxine, LORazepam, simvastatin, Melatonin, quinapril, potassium chloride SA, pantoprazole, metFORMIN, and furosemide.  Meds  ordered this encounter  Medications  . quinapril (ACCUPRIL) 40 MG tablet    Sig: Take 1 tablet (40 mg total) by mouth daily.    Dispense:  90 tablet    Refill:  1    DISCONTINUE ALL PREVIOUS REFILLS FOR THIS MEDICATION  . potassium chloride SA (KLOR-CON M20) 20 MEQ tablet    Sig: Take 1 tablet (20 mEq total) by mouth daily.    Dispense:  90 tablet    Refill:  1  . pantoprazole (PROTONIX) 40 MG tablet    Sig: Take 1 tablet by mouth  daily    Dispense:  90 tablet    Refill:  1  . metFORMIN (GLUCOPHAGE-XR) 500 MG 24 hr tablet    Sig: TAKE 1 TABLET BY MOUTH 2 TIMES DAILY.    Dispense:  60 tablet    Refill:  5  . furosemide (LASIX) 20 MG tablet    Sig: Take 1 tablet (20 mg total) by mouth daily.    Dispense:  90 tablet    Refill:  1    Problem List Items Addressed This Visit      Unprioritized   Hypothyroidism   Relevant Orders   TSH   Acid reflux   Relevant Medications   pantoprazole (PROTONIX) 40 MG tablet   DM (diabetes mellitus) type II uncontrolled, periph vascular disorder (HCC)   Relevant Medications   quinapril (ACCUPRIL) 40 MG tablet   metFORMIN (GLUCOPHAGE-XR) 500 MG 24 hr tablet   furosemide (LASIX) 20 MG tablet   Other Relevant Orders   Hemoglobin A1c    Other Visit Diagnoses    Hyperlipidemia, unspecified hyperlipidemia type    -  Primary   Relevant Medications   quinapril (ACCUPRIL) 40 MG tablet   furosemide (LASIX) 20 MG tablet   Other Relevant Orders   Comprehensive metabolic panel   Lipid panel   Essential hypertension       Relevant Medications   quinapril (ACCUPRIL) 40 MG tablet   potassium chloride SA (KLOR-CON M20) 20 MEQ tablet   furosemide (LASIX) 20 MG tablet   Other Relevant Orders   POCT urinalysis dipstick      Follow-up: Return in about 6 months (around 06/03/2016) for hypertension, hyperlipidemia, diabetes II, annual exam, fasting.  Ann Held, DO

## 2015-12-07 ENCOUNTER — Other Ambulatory Visit: Payer: Self-pay | Admitting: Family Medicine

## 2015-12-07 DIAGNOSIS — M170 Bilateral primary osteoarthritis of knee: Secondary | ICD-10-CM

## 2015-12-07 DIAGNOSIS — I10 Essential (primary) hypertension: Secondary | ICD-10-CM

## 2015-12-08 LAB — URINE CULTURE

## 2015-12-09 ENCOUNTER — Other Ambulatory Visit: Payer: Self-pay

## 2015-12-09 ENCOUNTER — Telehealth: Payer: Self-pay | Admitting: Family Medicine

## 2015-12-09 ENCOUNTER — Telehealth: Payer: Self-pay

## 2015-12-09 DIAGNOSIS — F411 Generalized anxiety disorder: Secondary | ICD-10-CM

## 2015-12-09 MED ORDER — LORAZEPAM 0.5 MG PO TABS: 0.5000 mg | ORAL_TABLET | Freq: Every day | ORAL | 0 refills | Status: DC | PRN

## 2015-12-09 NOTE — Telephone Encounter (Signed)
Refill x1--- need contract and uds

## 2015-12-09 NOTE — Telephone Encounter (Signed)
Last filled: 09/02/15 Amt: 90, 0 Last OV:  12/05/15 12/05/15 no control substance contract sign, uds sample given,  Please advise.

## 2015-12-09 NOTE — Telephone Encounter (Signed)
Rx printed with contract attached and forwarded to Dr. Carollee Herter for review and signature.

## 2015-12-09 NOTE — Telephone Encounter (Signed)
Self. Refill request for LORazepam.    Pharmacy: [facility] [place], [place] [address]

## 2015-12-09 NOTE — Telephone Encounter (Signed)
Left message on pt's vm informing her Rx is printed and signed by pcp, ready for pick up at front. Pt need to sign controlled substance contract. LB

## 2015-12-11 ENCOUNTER — Other Ambulatory Visit: Payer: Self-pay | Admitting: Family Medicine

## 2015-12-11 ENCOUNTER — Encounter: Payer: Self-pay | Admitting: Family Medicine

## 2015-12-11 DIAGNOSIS — N390 Urinary tract infection, site not specified: Secondary | ICD-10-CM

## 2015-12-11 MED ORDER — CIPROFLOXACIN HCL 250 MG PO TABS: 250.0000 mg | ORAL_TABLET | Freq: Two times a day (BID) | ORAL | 0 refills | Status: DC

## 2015-12-12 ENCOUNTER — Telehealth: Payer: Self-pay

## 2015-12-12 DIAGNOSIS — R739 Hyperglycemia, unspecified: Secondary | ICD-10-CM

## 2015-12-12 DIAGNOSIS — E785 Hyperlipidemia, unspecified: Secondary | ICD-10-CM

## 2015-12-12 MED FILL — METFORMIN HCL ER 500 MG TAB: 500 | 30 days supply | Qty: 60 | Fill #0

## 2015-12-12 MED FILL — CIPROFLOXACIN HCL 250 MG TA: 250 | 3 days supply | Qty: 6 | Fill #0

## 2015-12-12 NOTE — Telephone Encounter (Signed)
Spoke with pt, pt states she understand results, new medication instructions, and follow up appointment with lab. Pt states she will start abx tonight. Pt had no further questions or concerns at this time. LB

## 2015-12-30 DIAGNOSIS — L570 Actinic keratosis: Secondary | ICD-10-CM | POA: Diagnosis not present

## 2015-12-30 DIAGNOSIS — L578 Other skin changes due to chronic exposure to nonionizing radiation: Secondary | ICD-10-CM | POA: Diagnosis not present

## 2015-12-30 DIAGNOSIS — Z85828 Personal history of other malignant neoplasm of skin: Secondary | ICD-10-CM | POA: Diagnosis not present

## 2015-12-30 DIAGNOSIS — Z08 Encounter for follow-up examination after completed treatment for malignant neoplasm: Secondary | ICD-10-CM | POA: Diagnosis not present

## 2015-12-30 DIAGNOSIS — L905 Scar conditions and fibrosis of skin: Secondary | ICD-10-CM | POA: Diagnosis not present

## 2016-01-02 ENCOUNTER — Other Ambulatory Visit: Payer: Self-pay | Admitting: Family Medicine

## 2016-01-10 MED FILL — METFORMIN HCL ER 500 MG TAB: 500 | 30 days supply | Qty: 60 | Fill #1

## 2016-01-12 ENCOUNTER — Telehealth: Payer: Self-pay | Admitting: *Deleted

## 2016-01-12 NOTE — Telephone Encounter (Signed)
Drug screen results received and sent to scan.

## 2016-02-10 ENCOUNTER — Other Ambulatory Visit: Payer: Self-pay | Admitting: Family Medicine

## 2016-02-10 DIAGNOSIS — I10 Essential (primary) hypertension: Secondary | ICD-10-CM

## 2016-02-17 MED FILL — METFORMIN HCL ER 500 MG TAB: 500 | 30 days supply | Qty: 60 | Fill #2

## 2016-03-05 ENCOUNTER — Ambulatory Visit (HOSPITAL_BASED_OUTPATIENT_CLINIC_OR_DEPARTMENT_OTHER): Payer: PPO | Admitting: Family

## 2016-03-05 ENCOUNTER — Other Ambulatory Visit (HOSPITAL_BASED_OUTPATIENT_CLINIC_OR_DEPARTMENT_OTHER): Payer: PPO

## 2016-03-05 VITALS — BP 145/61 | HR 90 | Temp 98.4°F | Wt 175.1 lb

## 2016-03-05 DIAGNOSIS — K922 Gastrointestinal hemorrhage, unspecified: Secondary | ICD-10-CM | POA: Diagnosis not present

## 2016-03-05 DIAGNOSIS — D509 Iron deficiency anemia, unspecified: Secondary | ICD-10-CM

## 2016-03-05 DIAGNOSIS — D696 Thrombocytopenia, unspecified: Secondary | ICD-10-CM | POA: Diagnosis not present

## 2016-03-05 DIAGNOSIS — D6959 Other secondary thrombocytopenia: Secondary | ICD-10-CM

## 2016-03-05 DIAGNOSIS — D500 Iron deficiency anemia secondary to blood loss (chronic): Secondary | ICD-10-CM

## 2016-03-05 DIAGNOSIS — E559 Vitamin D deficiency, unspecified: Secondary | ICD-10-CM

## 2016-03-05 LAB — CBC WITH DIFFERENTIAL (CANCER CENTER ONLY)
BASO#: 0 10*3/uL (ref 0.0–0.2)
BASO%: 0.4 % (ref 0.0–2.0)
EOS%: 3.5 % (ref 0.0–7.0)
Eosinophils Absolute: 0.2 10*3/uL (ref 0.0–0.5)
HCT: 35.5 % (ref 34.8–46.6)
HGB: 12.2 g/dL (ref 11.6–15.9)
LYMPH#: 0.7 10*3/uL — ABNORMAL LOW (ref 0.9–3.3)
LYMPH%: 14.3 % (ref 14.0–48.0)
MCH: 34.1 pg — ABNORMAL HIGH (ref 26.0–34.0)
MCHC: 34.4 g/dL (ref 32.0–36.0)
MCV: 99 fL (ref 81–101)
MONO#: 0.4 10*3/uL (ref 0.1–0.9)
MONO%: 8.1 % (ref 0.0–13.0)
NEUT#: 3.6 10*3/uL (ref 1.5–6.5)
NEUT%: 73.7 % (ref 39.6–80.0)
Platelets: 66 10*3/uL — ABNORMAL LOW (ref 145–400)
RBC: 3.58 10*6/uL — ABNORMAL LOW (ref 3.70–5.32)
RDW: 14.4 % (ref 11.1–15.7)
WBC: 4.8 10*3/uL (ref 3.9–10.0)

## 2016-03-05 LAB — COMPREHENSIVE METABOLIC PANEL
ALT: 22 U/L (ref 0–55)
AST: 29 U/L (ref 5–34)
Albumin: 3.5 g/dL (ref 3.5–5.0)
Alkaline Phosphatase: 127 U/L (ref 40–150)
Anion Gap: 9 mEq/L (ref 3–11)
BUN: 14.3 mg/dL (ref 7.0–26.0)
CO2: 26 mEq/L (ref 22–29)
Calcium: 9.3 mg/dL (ref 8.4–10.4)
Chloride: 104 mEq/L (ref 98–109)
Creatinine: 0.7 mg/dL (ref 0.6–1.1)
EGFR: 76 mL/min/{1.73_m2} — ABNORMAL LOW (ref >90)
Glucose: 283 mg/dl — ABNORMAL HIGH (ref 70–140)
Potassium: 3.6 mEq/L (ref 3.5–5.1)
Sodium: 139 mEq/L (ref 136–145)
Total Bilirubin: 1.75 mg/dL — ABNORMAL HIGH (ref 0.20–1.20)
Total Protein: 5.9 g/dL — ABNORMAL LOW (ref 6.4–8.3)

## 2016-03-05 LAB — IRON AND TIBC
%SAT: 28 % (ref 21–57)
Iron: 75 ug/dL (ref 41–142)
TIBC: 268 ug/dL (ref 236–444)
UIBC: 193 ug/dL (ref 120–384)

## 2016-03-05 LAB — FERRITIN: Ferritin: 90 ng/mL (ref 9–269)

## 2016-03-05 LAB — CHCC SATELLITE - SMEAR

## 2016-03-05 NOTE — Progress Notes (Signed)
Hematology and Oncology Follow Up Visit  Jessica Dennis [id number] [birth year] 83 y.o. 03/05/2016   Principle Diagnosis:  Chronic thrombocytopenia secondary to hepato-steatosis and splenomegaly Iron deficiency anemia secondary to GI bleed   Current Therapy:   Observation    Interim History:  Jessica Dennis is here today for a follow-up. She has hemorrhoids that occasionally bleed a little if she strains to have a BM. Hgb is stable at 12.2 with an MCV of 99. Platelet count is holding steady at 66. Iron studies are pending. No other episodes of bleeding or bruising.  No fever, chills, n/v, cough, rash, headaches, blurred vision, ice cravings, SOB, chest pain, palpitations, abdominal pain or changes in bowel or bladder habits.  She has some chronic swelling and numbness around her right ankle where she had the Mohs procedure several years ago.  She has maintained a good appetite and is staying well hydrated. Her weight is stable.  She states that her morning glucose runs high and is concerned about her A1c of 6.3. She plans to ask for a referral to endocrinology when she sees her PCP next week.   Medications:  Allergies as of 03/05/2016   No Known Allergies     Medication List       Accurate as of 03/05/16 12:00 PM. Always use your most recent med list.          ACCU-CHEK FASTCLIX LANCETS Misc Check Blood sugar once daily. 123XX123   ONETOUCH DELICA LANCETS FINE Misc Check blood sugar once daily. Dx:E11.9   Calcium Carbonate-Vitamin D 600-200 MG-UNIT Caps Take by mouth.   ciprofloxacin 250 MG tablet Commonly known as:  CIPRO Take 1 tablet (250 mg total) by mouth 2 (two) times daily.   ferrous sulfate 325 (65 FE) MG tablet Take 325 mg by mouth.   Fish Oil 1000 MG Caps Take by mouth every morning.   furosemide 20 MG tablet Commonly known as:  LASIX Take 1 tablet (20 mg total) by mouth daily.   latanoprost 0.005 % ophthalmic solution Commonly known as:  XALATAN Place 1  drop into both eyes at bedtime.   levothyroxine 75 MCG tablet Commonly known as:  SYNTHROID, LEVOTHROID TAKE ONE TABLET BY MOUTH DAILY   LORazepam 0.5 MG tablet Commonly known as:  ATIVAN Take 1 tablet (0.5 mg total) by mouth daily as needed.   Melatonin 5 MG Caps Take 5 mg by mouth.   meloxicam 7.5 MG tablet Commonly known as:  MOBIC TAKE ONE TABLET BY MOUTH TWICE A DAY   metFORMIN 500 MG 24 hr tablet Commonly known as:  GLUCOPHAGE-XR TAKE 1 TABLET BY MOUTH 2 TIMES DAILY.   ONETOUCH VERIO test strip Generic drug:  glucose blood Check blood sugar once daily. Dx:E11.9   pantoprazole 40 MG tablet Commonly known as:  PROTONIX Take 1 tablet by mouth  daily   Potassium Chloride ER 20 MEQ Tbcr TAKE ONE TABLET BY MOUTH DAILY   potassium chloride SA 20 MEQ tablet Commonly known as:  KLOR-CON M20 Take 1 tablet (20 mEq total) by mouth daily.   Prednicarbate 0.1 % Crea Apply topically 2 (two) times daily.   quinapril 40 MG tablet Commonly known as:  ACCUPRIL Take 1 tablet (40 mg total) by mouth daily.   quinapril 40 MG tablet Commonly known as:  ACCUPRIL TAKE ONE TABLET BY MOUTH DAILY   simvastatin 20 MG tablet Commonly known as:  ZOCOR TAKE ONE TABLET BY MOUTH EVERY NIGHT AT BEDTIME   vitamin C 1000  MG tablet Take 1,000 mg by mouth daily.       Allergies: No Known Allergies  Past Medical History, Surgical history, Social history, and Family History were reviewed and updated.  Review of Systems: All other 10 point review of systems is negative.   Physical Exam:  weight is 175 lb 1 oz (79.4 kg). Her oral temperature is 98.4 F (36.9 C). Her blood pressure is 145/61 (abnormal) and her pulse is 90.   Wt Readings from Last 3 Encounters:  03/05/16 175 lb 1 oz (79.4 kg)  12/05/15 172 lb 3.2 oz (78.1 kg)  11/25/15 166 lb (75.3 kg)    Ocular: Sclerae unicteric, pupils equal, round and reactive to light Ear-nose-throat: Oropharynx clear, dentition  fair Lymphatic: No cervical, supraclavicular or axillary adenopathy  Lungs no rales or rhonchi, good excursion bilaterally Heart regular rate and rhythm, no murmur appreciated Abd soft, nontender, positive bowel sounds, no liver or spleen tip palpated on exam, no fluid wave MSK no focal spinal tenderness, no joint edema Neuro: non-focal, well-oriented, appropriate affect Breasts: Deferred  Lab Results  Component Value Date   WBC 4.8 03/05/2016   HGB 12.2 03/05/2016   HCT 35.5 03/05/2016   MCV 99 03/05/2016   PLT 66 (L) 03/05/2016   Lab Results  Component Value Date   FERRITIN 130 11/25/2015   IRON 148 (H) 11/25/2015   TIBC 265 11/25/2015   UIBC 117 (L) 11/25/2015   IRONPCTSAT 56 11/25/2015   Lab Results  Component Value Date   RBC 3.58 (L) 03/05/2016   No results found for: KPAFRELGTCHN, LAMBDASER, KAPLAMBRATIO No results found for: IGGSERUM, IGA, IGMSERUM No results found for: Odetta Pink, SPEI   Chemistry      Component Value Date/Time   NA 138 12/05/2015 1207   NA 141 11/25/2015 1138   K 3.8 12/05/2015 1207   K 4.0 11/25/2015 1138   CL 102 12/05/2015 1207   CL 101 05/23/2015 1421   CO2 27 12/05/2015 1207   CO2 27 11/25/2015 1138   BUN 12 12/05/2015 1207   BUN 11.9 11/25/2015 1138   CREATININE 0.60 12/05/2015 1207   CREATININE 0.7 11/25/2015 1138      Component Value Date/Time   CALCIUM 9.0 12/05/2015 1207   CALCIUM 9.7 11/25/2015 1138   ALKPHOS 95 12/05/2015 1207   ALKPHOS 111 11/25/2015 1138   AST 31 12/05/2015 1207   AST 38 (H) 11/25/2015 1138   ALT 25 12/05/2015 1207   ALT 28 11/25/2015 1138   BILITOT 1.5 (H) 12/05/2015 1207   BILITOT 2.04 (H) 11/25/2015 1138     Impression and Plan: Jessica Dennis is a very pleasant 83 yo white female with chronic thrombocytopenia secondary to hepatic steatosis and splenomegaly. She also has iron deficiency anemia due to intermittent GI bleeding. Hgb is stable at  12.2 and platelet count is holding at 66.   We will see what her iron studies show and bring her back in later this week for an infusion if needed.  We will plan to see her back in 3 months for repeat labs and follow-up.  She will contact our office with any questions or concerns. We can certainly see her sooner if need be.   Eliezer Bottom, NP 2/12/201812:00 PM

## 2016-03-06 ENCOUNTER — Encounter: Payer: Self-pay | Admitting: *Deleted

## 2016-03-06 LAB — VITAMIN D 25 HYDROXY (VIT D DEFICIENCY, FRACTURES): Vitamin D, 25-Hydroxy: 38.1 ng/mL (ref 30.0–100.0)

## 2016-03-06 LAB — RETICULOCYTES: Reticulocyte Count: 5.5 % — ABNORMAL HIGH (ref 0.6–2.6)

## 2016-03-10 ENCOUNTER — Other Ambulatory Visit: Payer: Self-pay | Admitting: Family Medicine

## 2016-03-10 DIAGNOSIS — F411 Generalized anxiety disorder: Secondary | ICD-10-CM

## 2016-03-10 DIAGNOSIS — M170 Bilateral primary osteoarthritis of knee: Secondary | ICD-10-CM

## 2016-03-12 ENCOUNTER — Other Ambulatory Visit: Payer: Self-pay

## 2016-03-12 DIAGNOSIS — F411 Generalized anxiety disorder: Secondary | ICD-10-CM

## 2016-03-12 MED ORDER — LORAZEPAM 0.5 MG PO TABS: 0.5000 mg | ORAL_TABLET | ORAL | 0 refills | Status: DC | PRN

## 2016-03-12 NOTE — Telephone Encounter (Signed)
Last seen 12/05/15  Last filled 12/09/15 #[id number] rf  Please advise pc

## 2016-03-13 ENCOUNTER — Other Ambulatory Visit (INDEPENDENT_AMBULATORY_CARE_PROVIDER_SITE_OTHER): Payer: PPO

## 2016-03-13 ENCOUNTER — Other Ambulatory Visit: Payer: Self-pay | Admitting: *Deleted

## 2016-03-13 DIAGNOSIS — E119 Type 2 diabetes mellitus without complications: Secondary | ICD-10-CM | POA: Diagnosis not present

## 2016-03-13 DIAGNOSIS — F411 Generalized anxiety disorder: Secondary | ICD-10-CM

## 2016-03-13 DIAGNOSIS — E785 Hyperlipidemia, unspecified: Secondary | ICD-10-CM

## 2016-03-13 LAB — COMPREHENSIVE METABOLIC PANEL
ALT: 19 U/L (ref 0–35)
AST: 29 U/L (ref 0–37)
Albumin: 3.6 g/dL (ref 3.5–5.2)
Alkaline Phosphatase: 106 U/L (ref 39–117)
BUN: 12 mg/dL (ref 6–23)
CO2: 27 mEq/L (ref 19–32)
Calcium: 8.9 mg/dL (ref 8.4–10.5)
Chloride: 104 mEq/L (ref 96–112)
Creatinine, Ser: 0.59 mg/dL (ref 0.40–1.20)
GFR: 103.92 mL/min (ref >60.00)
Glucose, Bld: 208 mg/dL — ABNORMAL HIGH (ref 70–99)
Potassium: 3.4 mEq/L — ABNORMAL LOW (ref 3.5–5.1)
Sodium: 138 mEq/L (ref 135–145)
Total Bilirubin: 1.5 mg/dL — ABNORMAL HIGH (ref 0.2–1.2)
Total Protein: 5.8 g/dL — ABNORMAL LOW (ref 6.0–8.3)

## 2016-03-13 LAB — HEMOGLOBIN A1C: Hgb A1c MFr Bld: 5.9 % (ref 4.6–6.5)

## 2016-03-13 LAB — LIPID PANEL
Cholesterol: 135 mg/dL (ref 0–200)
HDL: 49.7 mg/dL (ref >39.00)
LDL Cholesterol: 71 mg/dL (ref 0–99)
NonHDL: 84.91
Total CHOL/HDL Ratio: 3
Triglycerides: 72 mg/dL (ref 0.0–149.0)
VLDL: 14.4 mg/dL (ref 0.0–40.0)

## 2016-03-13 MED ORDER — LORAZEPAM 0.5 MG PO TABS: 0.5000 mg | ORAL_TABLET | Freq: Every day | ORAL | 0 refills | Status: DC | PRN

## 2016-03-13 MED FILL — LORazepam 0.5 MG TABS: 0.5 | 90 days supply | Qty: 90 | Fill #0

## 2016-03-14 DIAGNOSIS — M7541 Impingement syndrome of right shoulder: Secondary | ICD-10-CM | POA: Diagnosis not present

## 2016-03-14 DIAGNOSIS — M25511 Pain in right shoulder: Secondary | ICD-10-CM | POA: Diagnosis not present

## 2016-03-15 MED FILL — METFORMIN HCL ER 500 MG TAB: 500 | 30 days supply | Qty: 60 | Fill #3

## 2016-03-22 ENCOUNTER — Telehealth: Payer: Self-pay | Admitting: Family Medicine

## 2016-03-22 NOTE — Telephone Encounter (Signed)
Pt returned call for lab results.

## 2016-03-22 NOTE — Telephone Encounter (Signed)
See results

## 2016-03-23 ENCOUNTER — Other Ambulatory Visit: Payer: Self-pay | Admitting: Family Medicine

## 2016-03-23 DIAGNOSIS — E1040 Type 1 diabetes mellitus with diabetic neuropathy, unspecified: Secondary | ICD-10-CM

## 2016-03-26 ENCOUNTER — Other Ambulatory Visit: Payer: Self-pay | Admitting: Family Medicine

## 2016-03-26 DIAGNOSIS — Z1231 Encounter for screening mammogram for malignant neoplasm of breast: Secondary | ICD-10-CM

## 2016-04-03 ENCOUNTER — Encounter: Payer: Self-pay | Admitting: Family Medicine

## 2016-04-03 MED FILL — POTASSIUM CL ER 20 MEQ TABL: 20 | 90 days supply | Qty: 90 | Fill #1

## 2016-04-09 MED FILL — ONE TOUCH ULTRA TEST STRIPS: 50 days supply | Qty: 50 | Fill #2

## 2016-04-09 MED FILL — ACCU-CHEK FASTCLIX LANCETS: 102 days supply | Qty: 102 | Fill #1

## 2016-04-16 ENCOUNTER — Encounter: Payer: PPO | Admitting: Family Medicine

## 2016-04-16 ENCOUNTER — Ambulatory Visit (HOSPITAL_BASED_OUTPATIENT_CLINIC_OR_DEPARTMENT_OTHER)
Admission: RE | Admit: 2016-04-16 | Discharge: 2016-04-16 | Disposition: A | Discharge status: Home / Self Care | Payer: PPO | Source: Ambulatory Visit | Attending: Family Medicine | Admitting: Family Medicine

## 2016-04-16 DIAGNOSIS — Z1231 Encounter for screening mammogram for malignant neoplasm of breast: Secondary | ICD-10-CM | POA: Diagnosis not present

## 2016-04-16 MED FILL — METFORMIN HCL ER 500 MG TAB: 500 | 30 days supply | Qty: 60 | Fill #4

## 2016-04-19 ENCOUNTER — Telehealth: Payer: Self-pay | Admitting: Family Medicine

## 2016-04-19 ENCOUNTER — Encounter: Payer: Self-pay | Admitting: Family Medicine

## 2016-04-19 ENCOUNTER — Ambulatory Visit (INDEPENDENT_AMBULATORY_CARE_PROVIDER_SITE_OTHER): Payer: PPO | Admitting: Family Medicine

## 2016-04-19 VITALS — BP 132/78 | HR 87 | Temp 98.1°F | Resp 16 | Ht 68.0 in | Wt 178.8 lb

## 2016-04-19 DIAGNOSIS — R140 Abdominal distension (gaseous): Secondary | ICD-10-CM | POA: Diagnosis not present

## 2016-04-19 DIAGNOSIS — K219 Gastro-esophageal reflux disease without esophagitis: Secondary | ICD-10-CM

## 2016-04-19 DIAGNOSIS — E1151 Type 2 diabetes mellitus with diabetic peripheral angiopathy without gangrene: Secondary | ICD-10-CM | POA: Diagnosis not present

## 2016-04-19 DIAGNOSIS — I10 Essential (primary) hypertension: Secondary | ICD-10-CM | POA: Diagnosis not present

## 2016-04-19 DIAGNOSIS — E039 Hypothyroidism, unspecified: Secondary | ICD-10-CM

## 2016-04-19 DIAGNOSIS — E1165 Type 2 diabetes mellitus with hyperglycemia: Secondary | ICD-10-CM

## 2016-04-19 DIAGNOSIS — E785 Hyperlipidemia, unspecified: Secondary | ICD-10-CM | POA: Diagnosis not present

## 2016-04-19 DIAGNOSIS — R0989 Other specified symptoms and signs involving the circulatory and respiratory systems: Secondary | ICD-10-CM

## 2016-04-19 DIAGNOSIS — R011 Cardiac murmur, unspecified: Secondary | ICD-10-CM | POA: Diagnosis not present

## 2016-04-19 DIAGNOSIS — R102 Pelvic and perineal pain: Secondary | ICD-10-CM

## 2016-04-19 DIAGNOSIS — M170 Bilateral primary osteoarthritis of knee: Secondary | ICD-10-CM

## 2016-04-19 DIAGNOSIS — IMO0002 Reserved for concepts with insufficient information to code with codable children: Secondary | ICD-10-CM

## 2016-04-19 LAB — COMPREHENSIVE METABOLIC PANEL
ALT: 15 U/L (ref 6–29)
AST: 25 U/L (ref 10–35)
Albumin: 3.2 g/dL — ABNORMAL LOW (ref 3.6–5.1)
Alkaline Phosphatase: 104 U/L (ref 33–130)
BUN: 12 mg/dL (ref 7–25)
CO2: 24 mmol/L (ref 20–31)
Calcium: 9.2 mg/dL (ref 8.6–10.4)
Chloride: 106 mmol/L (ref 98–110)
Creat: 0.61 mg/dL (ref 0.60–0.88)
Glucose, Bld: 155 mg/dL — ABNORMAL HIGH (ref 65–99)
Potassium: 3.7 mmol/L (ref 3.5–5.3)
Sodium: 141 mmol/L (ref 135–146)
Total Bilirubin: 1.3 mg/dL — ABNORMAL HIGH (ref 0.2–1.2)
Total Protein: 5.6 g/dL — ABNORMAL LOW (ref 6.1–8.1)

## 2016-04-19 LAB — TSH: TSH: 3.63 mIU/L

## 2016-04-19 MED ORDER — POTASSIUM CHLORIDE CRYS ER 20 MEQ PO TBCR: 20.0000 meq | EXTENDED_RELEASE_TABLET | Freq: Every day | ORAL | 1 refills | Status: DC

## 2016-04-19 MED ORDER — QUINAPRIL HCL 40 MG PO TABS: 40.0000 mg | ORAL_TABLET | Freq: Every day | ORAL | 1 refills | Status: DC

## 2016-04-19 MED ORDER — ZOSTER VAC RECOMB ADJUVANTED 50 MCG/0.5ML IM SUSR: 0.5000 mL | Freq: Once | INTRAMUSCULAR | 1 refills | Status: AC

## 2016-04-19 MED ORDER — SIMVASTATIN 20 MG PO TABS: 20.0000 mg | ORAL_TABLET | Freq: Every day | ORAL | 1 refills | Status: DC

## 2016-04-19 MED ORDER — MELOXICAM 7.5 MG PO TABS: 7.5000 mg | ORAL_TABLET | Freq: Two times a day (BID) | ORAL | 1 refills | Status: DC

## 2016-04-19 MED ORDER — FUROSEMIDE 20 MG PO TABS: 20.0000 mg | ORAL_TABLET | Freq: Every day | ORAL | 1 refills | Status: DC

## 2016-04-19 MED ORDER — LEVOTHYROXINE SODIUM 75 MCG PO TABS
75.0000 ug | ORAL_TABLET | Freq: Every day | ORAL | 1 refills | Status: DC
Start: 2016-04-19 — End: 2016-10-27

## 2016-04-19 MED ORDER — PANTOPRAZOLE SODIUM 40 MG PO TBEC: DELAYED_RELEASE_TABLET | ORAL | 1 refills | Status: DC

## 2016-04-19 MED ORDER — METFORMIN HCL ER 500 MG PO TB24: ORAL_TABLET | ORAL | 1 refills | Status: DC

## 2016-04-19 MED ORDER — FUROSEMIDE 20 MG PO TABS: 20.0000 mg | ORAL_TABLET | Freq: Two times a day (BID) | ORAL | 1 refills | Status: DC

## 2016-04-19 NOTE — Assessment & Plan Note (Signed)
Well controlled, no changes to meds. Encouraged heart healthy diet such as the DASH diet and exercise as tolerated.  °

## 2016-04-19 NOTE — Telephone Encounter (Signed)
Caller name: Relationship to patient: Can be reached: Pharmacy:  [facility] - [place], [address] [phone] (Phone) [phone] (Fax)     Reason for call: Please call pharmacy to clarify order for Furosemide

## 2016-04-19 NOTE — Assessment & Plan Note (Signed)
Encouraged heart healthy diet, increase exercise, avoid trans fats, consider a krill oil cap daily 

## 2016-04-19 NOTE — Patient Instructions (Signed)

## 2016-04-19 NOTE — Assessment & Plan Note (Signed)
Check labs con't synthroid 

## 2016-04-19 NOTE — Progress Notes (Signed)
Pre visit review using our clinic review tool, if applicable. No additional management support is needed unless otherwise documented below in the visit note. 

## 2016-04-19 NOTE — Progress Notes (Signed)
Patient ID: Jessica Dennis, female   DOB: March 25, 1932, 83 y.o.   MRN: [id number]     Subjective:  I acted as a Education administrator for Dr. Carollee Herter.  Guerry Bruin, [place]   Patient ID: Jessica Dennis, female    DOB: 08/27/1932, 83 y.o.   MRN: [id number]  Chief Complaint  Patient presents with  . Hypertension  . Hypothyroidism  . Hyperlipidemia  . Diabetes    HPI  Patient is in today for follow up blood pressure, thyroid, cholesterol, and diabetes.   Sugars been running between 170-210.  She sees Dr Chalmers Cater on Monday.  She has bloating and swelling in the abdomen.  Patient Care Team: Ann Held, DO as PCP - General (Family Medicine) Linward Natal, MD as Referring Physician (Ophthalmology) Sheryn Bison, MD as Referring Physician (Dermatology) Gatha Mayer, MD as Consulting Physician (Gastroenterology) Volanda Napoleon, MD as Consulting Physician (Oncology) Janan Ridge, MD as Consulting Physician (Dermatology) Paralee Cancel, MD as Consulting Physician (Orthopedic Surgery)   Past Medical History:  Diagnosis Date  . Anemia   . Angiodysplasia of ascending colon 10/25/2014  . Anxiety   . Arthritis   . Bleeding gastric varices 01/19/2015  . Borderline diabetes   . Cancer of the skin, basal cell 09/03/2012  . Cirrhosis ([place])   . Diabetes mellitus without complication ([place])   . Diverticular disease   . GERD (gastroesophageal reflux disease)   . Hypertension   . Iron deficiency anemia due to chronic blood loss 01/19/2015  . Occult GI bleeding 01/19/2015  . Thyroid disease     Past Surgical History:  Procedure Laterality Date  . ABDOMINAL HYSTERECTOMY    . APPENDECTOMY    . COLONOSCOPY    . LEG SKIN LESION  BIOPSY / EXCISION  12/11/14  . TONSILLECTOMY    . TOTAL HIP ARTHROPLASTY Bilateral 1993, 2006    Family History  Problem Relation Age of Onset  . Bladder Cancer Father   . Heart attack Father   . Diabetes Mother     Social History   Social History  . Marital  status: Divorced    Spouse name: N/A  . Number of children: 2  . Years of education: N/A   Occupational History  . retired    Social History Main Topics  . Smoking status: Never Smoker  . Smokeless tobacco: Never Used     Comment: NEVER USED TOBACCO  . Alcohol use 1.2 oz/week    2 Standard drinks or equivalent per week     Comment: glass of wine daily  . Drug use: No  . Sexual activity: No   Other Topics Concern  . Not on file   Social History Narrative   The patient is divorced. She is originally from [place]. She has 2 sons. 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   08/10/2014       Outpatient Medications Prior to Visit  Medication Sig Dispense Refill  . ACCU-CHEK FASTCLIX LANCETS MISC Check Blood sugar once daily. Dx:E11.9 50 each 12  . Ascorbic Acid (VITAMIN C) 1000 MG tablet Take 1,000 mg by mouth daily.    . Calcium Carbonate-Vitamin D 600-200 MG-UNIT CAPS Take by mouth.    . ferrous sulfate 325 (65 FE) MG tablet Take 325 mg by mouth.    . latanoprost (XALATAN) 0.005 % ophthalmic solution Place 1 drop into both eyes at bedtime.     Marland Kitchen LORazepam (ATIVAN) 0.5 MG tablet Take 1 tablet (  0.5 mg total) by mouth daily as needed. 90 tablet 0  . Melatonin 5 MG CAPS Take 5 mg by mouth.    . Omega-3 Fatty Acids (FISH OIL) 1000 MG CAPS Take by mouth every morning.    Glory Rosebush DELICA LANCETS FINE MISC Check blood sugar once daily. Dx:E11.9 100 each 12  . ONETOUCH VERIO test strip Check blood sugar once daily. Dx:E11.9 100 each 12  . Prednicarbate 0.1 % CREA Apply topically 2 (two) times daily.     . furosemide (LASIX) 20 MG tablet Take 1 tablet (20 mg total) by mouth daily. 90 tablet 1  . levothyroxine (SYNTHROID, LEVOTHROID) 75 MCG tablet TAKE ONE TABLET BY MOUTH DAILY 30 tablet 11  . meloxicam (MOBIC) 7.5 MG tablet TAKE 1 TABLET BY MOUTH 2 TIMES DAILY 180 tablet 0  . metFORMIN (GLUCOPHAGE-XR) 500 MG 24 hr tablet TAKE 1 TABLET BY MOUTH 2 TIMES  DAILY. 60 tablet 5  . pantoprazole (PROTONIX) 40 MG tablet Take 1 tablet by mouth  daily 90 tablet 1  . potassium chloride SA (KLOR-CON M20) 20 MEQ tablet Take 1 tablet (20 mEq total) by mouth daily. 90 tablet 1  . quinapril (ACCUPRIL) 40 MG tablet Take 1 tablet (40 mg total) by mouth daily. 90 tablet 1  . simvastatin (ZOCOR) 20 MG tablet TAKE ONE TABLET BY MOUTH EVERY NIGHT AT BEDTIME 30 tablet 1  . ciprofloxacin (CIPRO) 250 MG tablet Take 1 tablet (250 mg total) by mouth 2 (two) times daily. 6 tablet 0  . Potassium Chloride ER 20 MEQ TBCR TAKE ONE TABLET BY MOUTH DAILY 90 tablet 0  . quinapril (ACCUPRIL) 40 MG tablet TAKE ONE TABLET BY MOUTH DAILY 90 tablet 0   No facility-administered medications prior to visit.     No Known Allergies  Review of Systems  Constitutional: Negative for fever and malaise/fatigue.  HENT: Negative for congestion.   Eyes: Negative for blurred vision.  Respiratory: Negative for cough and shortness of breath.   Cardiovascular: Negative for chest pain, palpitations and leg swelling.  Gastrointestinal: Negative for vomiting.  Musculoskeletal: Negative for back pain.  Skin: Negative for rash.  Neurological: Negative for loss of consciousness and headaches.       Objective:    Physical Exam  Constitutional: She is oriented to person, place, and time. She appears well-developed and well-nourished. No distress.  HENT:  Head: Normocephalic and atraumatic.  Eyes: Conjunctivae are normal.  Neck: Normal range of motion. Carotid bruit is present. No thyromegaly present.  b/L bruits   Cardiovascular: Normal rate and regular rhythm.   Murmur heard. 2 out of 6  Pulmonary/Chest: Effort normal and breath sounds normal. She has no wheezes.  Abdominal: Soft. Bowel sounds are normal. There is no tenderness.  Musculoskeletal: Normal range of motion. She exhibits no edema or deformity.  Neurological: She is alert and oriented to person, place, and time.  Skin: Skin  is warm and dry. She is not diaphoretic.  Psychiatric: She has a normal mood and affect.    BP 132/78 (BP Location: Right Arm, Cuff Size: Normal)   Pulse 87   Temp 98.1 F (36.7 C) (Oral)   Resp 16   Ht 5' 8"  (1.727 m)   Wt 178 lb 12.8 oz (81.1 kg)   SpO2 94%   BMI 27.19 kg/m  Wt Readings from Last 3 Encounters:  04/19/16 178 lb 12.8 oz (81.1 kg)  03/05/16 175 lb 1 oz (79.4 kg)  12/05/15 172 lb 3.2 oz (  78.1 kg)   BP Readings from Last 3 Encounters:  04/19/16 132/78  03/05/16 (!) 145/61  12/05/15 134/70     Immunization History  Administered Date(s) Administered  . Hepatitis B, adult 05/12/2014  . Hepatitis B, ped/adol 08/10/2014  . Influenza, High Dose Seasonal PF 10/13/2014  . Influenza, Seasonal, Injecte, Preservative Fre 10/13/2014  . Influenza-Unspecified 11/25/2013, 10/06/2015  . Pneumococcal Conjugate-13 05/17/2014  . Pneumococcal Polysaccharide-23 10/06/2015  . Tdap 10/02/2011  . Zoster 01/22/2005    Health Maintenance  Topic Date Due  . FOOT EXAM  05/12/2015  . HEMOGLOBIN A1C  09/10/2016  . OPHTHALMOLOGY EXAM  11/22/2016  . MAMMOGRAM  04/16/2017  . TETANUS/TDAP  10/01/2021  . INFLUENZA VACCINE  Completed  . DEXA SCAN  Completed  . PNA vac Low Risk Adult  Addressed    Lab Results  Component Value Date   WBC 4.8 03/05/2016   HGB 12.2 03/05/2016   HCT 35.5 03/05/2016   PLT 66 (L) 03/05/2016   GLUCOSE 155 (H) 04/19/2016   CHOL 135 03/13/2016   TRIG 72.0 03/13/2016   HDL 49.70 03/13/2016   LDLCALC 71 03/13/2016   ALT 15 04/19/2016   AST 25 04/19/2016   NA 141 04/19/2016   K 3.7 04/19/2016   CL 106 04/19/2016   CREATININE 0.61 04/19/2016   BUN 12 04/19/2016   CO2 24 04/19/2016   TSH 3.63 04/19/2016   INR 1.3 (H) 08/10/2014   HGBA1C 5.9 03/13/2016   MICROALBUR 0.2 06/02/2015    Lab Results  Component Value Date   TSH 3.63 04/19/2016   Lab Results  Component Value Date   WBC 4.8 03/05/2016   HGB 12.2 03/05/2016   HCT 35.5 03/05/2016    MCV 99 03/05/2016   PLT 66 (L) 03/05/2016   Lab Results  Component Value Date   NA 141 04/19/2016   K 3.7 04/19/2016   CHLORIDE 104 03/05/2016   CO2 24 04/19/2016   GLUCOSE 155 (H) 04/19/2016   BUN 12 04/19/2016   CREATININE 0.61 04/19/2016   BILITOT 1.3 (H) 04/19/2016   ALKPHOS 104 04/19/2016   AST 25 04/19/2016   ALT 15 04/19/2016   PROT 5.6 (L) 04/19/2016   ALBUMIN 3.2 (L) 04/19/2016   CALCIUM 9.2 04/19/2016   ANIONGAP 9 03/05/2016   EGFR 76 (L) 03/05/2016   GFR 103.92 03/13/2016   Lab Results  Component Value Date   CHOL 135 03/13/2016   Lab Results  Component Value Date   HDL 49.70 03/13/2016   Lab Results  Component Value Date   LDLCALC 71 03/13/2016   Lab Results  Component Value Date   TRIG 72.0 03/13/2016   Lab Results  Component Value Date   CHOLHDL 3 03/13/2016   Lab Results  Component Value Date   HGBA1C 5.9 03/13/2016         Assessment & Plan:   Problem List Items Addressed This Visit      Unprioritized   Acid reflux   Relevant Medications   pantoprazole (PROTONIX) 40 MG tablet   Arthritis of knee, degenerative   Relevant Medications   meloxicam (MOBIC) 7.5 MG tablet   DM (diabetes mellitus) type II uncontrolled, periph vascular disorder (HCC)   Relevant Medications   simvastatin (ZOCOR) 20 MG tablet   metFORMIN (GLUCOPHAGE-XR) 500 MG 24 hr tablet   quinapril (ACCUPRIL) 40 MG tablet   furosemide (LASIX) 20 MG tablet   Essential hypertension, benign    Well controlled, no changes to meds. Encouraged heart  healthy diet such as the DASH diet and exercise as tolerated.       Relevant Medications   simvastatin (ZOCOR) 20 MG tablet   quinapril (ACCUPRIL) 40 MG tablet   furosemide (LASIX) 20 MG tablet   Other Relevant Orders   Comprehensive metabolic panel (Completed)   Hyperlipidemia LDL goal <100    Encouraged heart healthy diet, increase exercise, avoid trans fats, consider a krill oil cap daily       Relevant Medications    simvastatin (ZOCOR) 20 MG tablet   quinapril (ACCUPRIL) 40 MG tablet   furosemide (LASIX) 20 MG tablet   Hypothyroidism    Check labs con't synthroid      Relevant Medications   levothyroxine (SYNTHROID, LEVOTHROID) 75 MCG tablet   Other Relevant Orders   TSH (Completed)    Other Visit Diagnoses    Bilateral carotid bruits    -  Primary   Relevant Orders   US Carotid Bilateral   Essential hypertension       Relevant Medications   simvastatin (ZOCOR) 20 MG tablet   potassium chloride SA (KLOR-CON M20) 20 MEQ tablet   quinapril (ACCUPRIL) 40 MG tablet   furosemide (LASIX) 20 MG tablet   Abdominal bloating       Relevant Orders   Ambulatory referral to Gastroenterology   US Abdomen Complete   US Pelvis Complete   US Transvaginal Non-OB   Comprehensive metabolic panel (Completed)   Pelvic pain       Relevant Orders   US Pelvis Complete   Murmur       Relevant Orders   ECHOCARDIOGRAM COMPLETE      I have discontinued Ms. Heidecker's furosemide, Potassium Chloride ER, ciprofloxacin, and furosemide. I have also changed her simvastatin, meloxicam, and levothyroxine. Additionally, I am having her start on Zoster Vac Recomb Adjuvanted and furosemide. Lastly, I am having her maintain her Prednicarbate, latanoprost, Fish Oil, vitamin C, ACCU-CHEK FASTCLIX LANCETS, ONETOUCH VERIO, ONETOUCH DELICA LANCETS FINE, ferrous sulfate, Calcium Carbonate-Vitamin D, Melatonin, LORazepam, pantoprazole, metFORMIN, potassium chloride SA, and quinapril.  Meds ordered this encounter  Medications  . simvastatin (ZOCOR) 20 MG tablet    Sig: Take 1 tablet (20 mg total) by mouth at bedtime.    Dispense:  90 tablet    Refill:  1  . pantoprazole (PROTONIX) 40 MG tablet    Sig: Take 1 tablet by mouth  daily    Dispense:  90 tablet    Refill:  1  . metFORMIN (GLUCOPHAGE-XR) 500 MG 24 hr tablet    Sig: TAKE 1 TABLET BY MOUTH 2 TIMES DAILY.    Dispense:  180 tablet    Refill:  1  . meloxicam (MOBIC) 7.5  MG tablet    Sig: Take 1 tablet (7.5 mg total) by mouth 2 (two) times daily.    Dispense:  180 tablet    Refill:  1  . levothyroxine (SYNTHROID, LEVOTHROID) 75 MCG tablet    Sig: Take 1 tablet (75 mcg total) by mouth daily.    Dispense:  90 tablet    Refill:  1  . potassium chloride SA (KLOR-CON M20) 20 MEQ tablet    Sig: Take 1 tablet (20 mEq total) by mouth daily.    Dispense:  90 tablet    Refill:  1  . DISCONTD: furosemide (LASIX) 20 MG tablet    Sig: Take 1 tablet (20 mg total) by mouth daily.    Dispense:  90 tablet    Refill:  1  . quinapril (ACCUPRIL) 40 MG tablet    Sig: Take 1 tablet (40 mg total) by mouth daily.    Dispense:  90 tablet    Refill:  1  . Zoster Vac Recomb Adjuvanted [facility]) injection    Sig: Inject 0.5 mLs into the muscle once.    Dispense:  1 each    Refill:  1  . furosemide (LASIX) 20 MG tablet    Sig: Take 1 tablet (20 mg total) by mouth 2 (two) times daily.    Dispense:  180 tablet    Refill:  1    Please delete the 61m twice a day    CMA served as scribe during this visit. History, Physical and Plan performed by medical provider. Documentation and orders reviewed and attested to.  YAnn Held DO

## 2016-04-23 DIAGNOSIS — I10 Essential (primary) hypertension: Secondary | ICD-10-CM | POA: Diagnosis not present

## 2016-04-23 DIAGNOSIS — E1165 Type 2 diabetes mellitus with hyperglycemia: Secondary | ICD-10-CM | POA: Diagnosis not present

## 2016-04-23 DIAGNOSIS — E039 Hypothyroidism, unspecified: Secondary | ICD-10-CM | POA: Diagnosis not present

## 2016-04-23 DIAGNOSIS — E7800 Pure hypercholesterolemia, unspecified: Secondary | ICD-10-CM | POA: Diagnosis not present

## 2016-04-23 NOTE — Telephone Encounter (Signed)
Refill done.  

## 2016-04-25 ENCOUNTER — Ambulatory Visit (HOSPITAL_BASED_OUTPATIENT_CLINIC_OR_DEPARTMENT_OTHER)
Admission: RE | Admit: 2016-04-25 | Discharge: 2016-04-25 | Disposition: A | Discharge status: Home / Self Care | Payer: PPO | Source: Ambulatory Visit | Attending: Family Medicine | Admitting: Family Medicine

## 2016-04-25 DIAGNOSIS — R188 Other ascites: Secondary | ICD-10-CM | POA: Diagnosis not present

## 2016-04-25 DIAGNOSIS — K7460 Unspecified cirrhosis of liver: Secondary | ICD-10-CM | POA: Diagnosis not present

## 2016-04-25 DIAGNOSIS — I6523 Occlusion and stenosis of bilateral carotid arteries: Secondary | ICD-10-CM | POA: Insufficient documentation

## 2016-04-25 DIAGNOSIS — I700 Atherosclerosis of aorta: Secondary | ICD-10-CM | POA: Insufficient documentation

## 2016-04-25 DIAGNOSIS — R0989 Other specified symptoms and signs involving the circulatory and respiratory systems: Secondary | ICD-10-CM | POA: Diagnosis not present

## 2016-04-25 DIAGNOSIS — R140 Abdominal distension (gaseous): Secondary | ICD-10-CM

## 2016-04-26 ENCOUNTER — Ambulatory Visit (HOSPITAL_BASED_OUTPATIENT_CLINIC_OR_DEPARTMENT_OTHER)
Admission: RE | Admit: 2016-04-26 | Discharge: 2016-04-26 | Disposition: A | Discharge status: Home / Self Care | Payer: PPO | Source: Ambulatory Visit | Attending: Family Medicine | Admitting: Family Medicine

## 2016-04-26 ENCOUNTER — Other Ambulatory Visit: Payer: Self-pay | Admitting: Family Medicine

## 2016-04-26 DIAGNOSIS — R188 Other ascites: Secondary | ICD-10-CM | POA: Insufficient documentation

## 2016-04-26 DIAGNOSIS — R140 Abdominal distension (gaseous): Secondary | ICD-10-CM

## 2016-04-26 DIAGNOSIS — K819 Cholecystitis, unspecified: Secondary | ICD-10-CM

## 2016-04-26 DIAGNOSIS — R102 Pelvic and perineal pain: Secondary | ICD-10-CM

## 2016-04-26 DIAGNOSIS — K7460 Unspecified cirrhosis of liver: Secondary | ICD-10-CM | POA: Diagnosis not present

## 2016-04-27 ENCOUNTER — Other Ambulatory Visit: Payer: Self-pay | Admitting: Family Medicine

## 2016-04-27 DIAGNOSIS — R188 Other ascites: Secondary | ICD-10-CM

## 2016-04-30 ENCOUNTER — Ambulatory Visit (INDEPENDENT_AMBULATORY_CARE_PROVIDER_SITE_OTHER): Payer: PPO | Admitting: Family Medicine

## 2016-04-30 VITALS — BP 110/58 | HR 88 | Temp 98.0°F | Resp 17 | Ht 68.0 in | Wt 177.0 lb

## 2016-04-30 DIAGNOSIS — R140 Abdominal distension (gaseous): Secondary | ICD-10-CM

## 2016-04-30 DIAGNOSIS — Z87898 Personal history of other specified conditions: Secondary | ICD-10-CM | POA: Diagnosis not present

## 2016-04-30 LAB — AMMONIA: Ammonia: 70 umol/L — ABNORMAL HIGH (ref 11–35)

## 2016-04-30 NOTE — Progress Notes (Signed)
Pre visit review using our clinic review tool, if applicable. No additional management support is needed unless otherwise documented below in the visit note. 

## 2016-04-30 NOTE — Progress Notes (Signed)
Subjective:    Patient ID: Faylynn Stamos, female    DOB: 08-18-1932, 83 y.o.   MRN: [id number]  Chief Complaint  Patient presents with  . Abdominal Pain    hx ascites,  needs ekg and gi f/u    HPI  Patient is in today for follow up lab work, and ekg. Patient report she drinks alcohol but hasn't  had anything to drink since Thursday.   Patient Care Team: Ann Held, DO as PCP - General (Family Medicine) Linward Natal, MD as Referring Physician (Ophthalmology) Sheryn Bison, MD as Referring Physician (Dermatology) Gatha Mayer, MD as Consulting Physician (Gastroenterology) Volanda Napoleon, MD as Consulting Physician (Oncology) Janan Ridge, MD as Consulting Physician (Dermatology) Paralee Cancel, MD as Consulting Physician (Orthopedic Surgery)   Past Medical History:  Diagnosis Date  . Anemia   . Angiodysplasia of ascending colon 10/25/2014  . Anxiety   . Arthritis   . Bleeding gastric varices 01/19/2015  . Borderline diabetes   . Cancer of the skin, basal cell 09/03/2012  . Cirrhosis ([place])   . Diabetes mellitus without complication ([place])   . Diverticular disease   . GERD (gastroesophageal reflux disease)   . Hypertension   . Iron deficiency anemia due to chronic blood loss 01/19/2015  . Occult GI bleeding 01/19/2015  . Thyroid disease     Past Surgical History:  Procedure Laterality Date  . ABDOMINAL HYSTERECTOMY    . APPENDECTOMY    . COLONOSCOPY    . LEG SKIN LESION  BIOPSY / EXCISION  12/11/14  . TONSILLECTOMY    . TOTAL HIP ARTHROPLASTY Bilateral 1993, 2006    Family History  Problem Relation Age of Onset  . Bladder Cancer Father   . Heart attack Father   . Diabetes Mother     Social History   Social History  . Marital status: Divorced    Spouse name: N/A  . Number of children: 2  . Years of education: N/A   Occupational History  . retired    Social History Main Topics  . Smoking status: Never Smoker  . Smokeless tobacco:  Never Used     Comment: NEVER USED TOBACCO  . Alcohol use 1.2 oz/week    2 Standard drinks or equivalent per week     Comment: glass of wine daily  . Drug use: No  . Sexual activity: No   Other Topics Concern  . Not on file   Social History Narrative   The patient is divorced. She is originally from [place]. She has 2 sons. 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   08/10/2014       Outpatient Medications Prior to Visit  Medication Sig Dispense Refill  . ACCU-CHEK FASTCLIX LANCETS MISC Check Blood sugar once daily. Dx:E11.9 50 each 12  . Ascorbic Acid (VITAMIN C) 1000 MG tablet Take 1,000 mg by mouth daily.    . Calcium Carbonate-Vitamin D 600-200 MG-UNIT CAPS Take by mouth.    . ferrous sulfate 325 (65 FE) MG tablet Take 325 mg by mouth.    . latanoprost (XALATAN) 0.005 % ophthalmic solution Place 1 drop into both eyes at bedtime.     Marland Kitchen levothyroxine (SYNTHROID, LEVOTHROID) 75 MCG tablet Take 1 tablet (75 mcg total) by mouth daily. 90 tablet 1  . LORazepam (ATIVAN) 0.5 MG tablet Take 1 tablet (0.5 mg total) by mouth daily as needed. 90 tablet 0  . Melatonin 5 MG  CAPS Take 5 mg by mouth.    . meloxicam (MOBIC) 7.5 MG tablet Take 1 tablet (7.5 mg total) by mouth 2 (two) times daily. 180 tablet 1  . metFORMIN (GLUCOPHAGE-XR) 500 MG 24 hr tablet TAKE 1 TABLET BY MOUTH 2 TIMES DAILY. 180 tablet 1  . Omega-3 Fatty Acids (FISH OIL) 1000 MG CAPS Take by mouth every morning.    Glory Rosebush DELICA LANCETS FINE MISC Check blood sugar once daily. Dx:E11.9 100 each 12  . ONETOUCH VERIO test strip Check blood sugar once daily. Dx:E11.9 100 each 12  . pantoprazole (PROTONIX) 40 MG tablet Take 1 tablet by mouth  daily 90 tablet 1  . potassium chloride SA (KLOR-CON M20) 20 MEQ tablet Take 1 tablet (20 mEq total) by mouth daily. 90 tablet 1  . Prednicarbate 0.1 % CREA Apply topically 2 (two) times daily.     . quinapril (ACCUPRIL) 40 MG tablet Take 1 tablet (40 mg  total) by mouth daily. 90 tablet 1  . simvastatin (ZOCOR) 20 MG tablet Take 1 tablet (20 mg total) by mouth at bedtime. 90 tablet 1  . furosemide (LASIX) 20 MG tablet Take 1 tablet (20 mg total) by mouth 2 (two) times daily. 180 tablet 1   No facility-administered medications prior to visit.     No Known Allergies  Review of Systems  Constitutional: Negative for fever.  HENT: Negative for congestion.   Eyes: Negative for blurred vision.  Respiratory: Negative for cough.   Cardiovascular: Negative for chest pain and palpitations.  Gastrointestinal: Negative for vomiting.  Musculoskeletal: Negative for back pain.  Skin: Negative for rash.  Neurological: Negative for loss of consciousness and headaches.       Objective:    Physical Exam  Constitutional: She is oriented to person, place, and time. She appears well-developed and well-nourished. No distress.  HENT:  Head: Normocephalic and atraumatic.  Eyes: Conjunctivae are normal. Pupils are equal, round, and reactive to light.  Neck: Normal range of motion. No thyromegaly present.  Cardiovascular: Normal rate and regular rhythm.   Pulmonary/Chest: Effort normal and breath sounds normal. She has no wheezes.  Abdominal: Soft. Bowel sounds are normal. She exhibits distension. She exhibits no mass. There is no tenderness. There is no rebound and no guarding.  Musculoskeletal: Normal range of motion. She exhibits no edema or deformity.  Neurological: She is alert and oriented to person, place, and time.  Skin: Skin is warm and dry. She is not diaphoretic.  Psychiatric: She has a normal mood and affect.    BP (!) 110/58 (BP Location: Left Arm, Patient Position: Sitting, Cuff Size: Normal)   Pulse 88   Temp 98 F (36.7 C) (Oral)   Resp 17   Ht 5' 8"  (1.727 m)   Wt 177 lb (80.3 kg)   SpO2 94%   BMI 26.91 kg/m  Wt Readings from Last 3 Encounters:  04/30/16 177 lb (80.3 kg)  04/19/16 178 lb 12.8 oz (81.1 kg)  03/05/16 175 lb 1  oz (79.4 kg)   BP Readings from Last 3 Encounters:  04/30/16 (!) 110/58  04/19/16 132/78  03/05/16 (!) 145/61     Immunization History  Administered Date(s) Administered  . Hepatitis B, adult 05/12/2014  . Hepatitis B, ped/adol 08/10/2014  . Influenza, High Dose Seasonal PF 10/13/2014  . Influenza, Seasonal, Injecte, Preservative Fre 10/13/2014  . Influenza-Unspecified 11/25/2013, 10/06/2015  . Pneumococcal Conjugate-13 05/17/2014  . Pneumococcal Polysaccharide-23 10/06/2015  . Tdap 10/02/2011  . Zoster  01/22/2005    Health Maintenance  Topic Date Due  . FOOT EXAM  05/12/2015  . INFLUENZA VACCINE  08/22/2016  . HEMOGLOBIN A1C  09/10/2016  . OPHTHALMOLOGY EXAM  11/22/2016  . MAMMOGRAM  04/16/2017  . TETANUS/TDAP  10/01/2021  . DEXA SCAN  Completed  . PNA vac Low Risk Adult  Addressed    Lab Results  Component Value Date   WBC 4.8 03/05/2016   HGB 12.2 03/05/2016   HCT 35.5 03/05/2016   PLT 66 (L) 03/05/2016   GLUCOSE 155 (H) 04/19/2016   CHOL 135 03/13/2016   TRIG 72.0 03/13/2016   HDL 49.70 03/13/2016   LDLCALC 71 03/13/2016   ALT 15 04/19/2016   AST 25 04/19/2016   NA 141 04/19/2016   K 3.7 04/19/2016   CL 106 04/19/2016   CREATININE 0.61 04/19/2016   BUN 12 04/19/2016   CO2 24 04/19/2016   TSH 3.63 04/19/2016   INR 1.3 (H) 08/10/2014   HGBA1C 5.9 03/13/2016   MICROALBUR 0.2 06/02/2015    Lab Results  Component Value Date   TSH 3.63 04/19/2016   Lab Results  Component Value Date   WBC 4.8 03/05/2016   HGB 12.2 03/05/2016   HCT 35.5 03/05/2016   MCV 99 03/05/2016   PLT 66 (L) 03/05/2016   Lab Results  Component Value Date   NA 141 04/19/2016   K 3.7 04/19/2016   CHLORIDE 104 03/05/2016   CO2 24 04/19/2016   GLUCOSE 155 (H) 04/19/2016   BUN 12 04/19/2016   CREATININE 0.61 04/19/2016   BILITOT 1.3 (H) 04/19/2016   ALKPHOS 104 04/19/2016   AST 25 04/19/2016   ALT 15 04/19/2016   PROT 5.6 (L) 04/19/2016   ALBUMIN 3.2 (L) 04/19/2016    CALCIUM 9.2 04/19/2016   ANIONGAP 9 03/05/2016   EGFR 76 (L) 03/05/2016   GFR 103.92 03/13/2016   Lab Results  Component Value Date   CHOL 135 03/13/2016   Lab Results  Component Value Date   HDL 49.70 03/13/2016   Lab Results  Component Value Date   LDLCALC 71 03/13/2016   Lab Results  Component Value Date   TRIG 72.0 03/13/2016   Lab Results  Component Value Date   CHOLHDL 3 03/13/2016   Lab Results  Component Value Date   HGBA1C 5.9 03/13/2016         Assessment & Plan:   Problem List Items Addressed This Visit    None    Visit Diagnoses    Hx of ascites    -  Primary   Relevant Orders   EKG 12-Lead (Completed)   Ammonia (Completed)   Abdominal distension        GI f/u pending Labs pending ekg-- no acute changes  I am having Ms. Plaugher maintain her Prednicarbate, latanoprost, Fish Oil, vitamin C, ACCU-CHEK FASTCLIX LANCETS, ONETOUCH VERIO, ONETOUCH DELICA LANCETS FINE, ferrous sulfate, Calcium Carbonate-Vitamin D, Melatonin, LORazepam, simvastatin, pantoprazole, metFORMIN, meloxicam, levothyroxine, potassium chloride SA, and quinapril.  No orders of the defined types were placed in this encounter.   CMA served as Education administrator during this visit. History, Physical and Plan performed by medical provider. Documentation and orders reviewed and attested to.  Ann Held, DO   Patient ID: Janeece Blok, female   DOB: 01-10-1933, 83 y.o.   MRN: [id number]

## 2016-04-30 NOTE — Patient Instructions (Signed)
Ascites  Ascites is a collection of excess fluid in the abdomen. Ascites can range from mild to severe. It can get worse without treatment.  What are the causes?  Possible causes include:  · Cirrhosis. This is the most common cause of ascites.  · Infection or inflammation in the abdomen.  · Cancer in the abdomen.  · Heart failure.  · Kidney disease.  · Inflammation of the pancreas.  · Clots in the veins of the liver.    What are the signs or symptoms?  Signs and symptoms may include:  · A feeling of fullness in your abdomen. This is common.  · An increase in the size of your abdomen or your waist.  · Swelling in your legs.  · Swelling of the scrotum in men.  · Difficulty breathing.  · Abdominal pain.  · Sudden weight gain.    If the condition is mild, you may not have symptoms.  How is this diagnosed?  To make a diagnosis, your health care provider will:  · Ask about your medical history.  · Perform a physical exam.  · Order imaging tests, such as an ultrasound or CT scan of your abdomen.    How is this treated?  Treatment depends on the cause of the ascites. It may include:  · Taking a pill to make you urinate. This is called a water pill (diuretic pill).  · Strictly reducing your salt (sodium) intake. Salt can cause extra fluid to be kept in the body, and this makes ascites worse.  · Having a procedure to remove fluid from your abdomen (paracentesis).  · Having a procedure to transfer fluid from your abdomen into a vein.  · Having a procedure that connects two of the major veins within your liver and relieves pressure on your liver (TIPS procedure).    Ascites may go away or improve with treatment of the condition that caused it.  Follow these instructions at home:  · Keep track of your weight. To do this, weigh yourself at the same time every day and record your weight.  · Keep track of how much you drink and any changes in the amount you urinate.  · Follow any instructions that your health care provider gives  you about how much to drink.  · Try not to eat salty (high-sodium) foods.  · Take medicines only as directed by your health care provider.  · Keep all follow-up visits as directed by your health care provider. This is important.  · Report any changes in your health to your health care provider, especially if you develop new symptoms or your symptoms get worse.  Contact a health care provider if:  · Your gain more than 3 pounds in 3 days.  · Your abdominal size or your waist size increases.  · You have new swelling in your legs.  · The swelling in your legs gets worse.  Get help right away if:  · You develop a fever.  · You develop confusion.  · You develop new or worsening difficulty breathing.  · You develop new or worsening abdominal pain.  · You develop new or worsening swelling in the scrotum (in men).  This information is not intended to replace advice given to you by your health care provider. Make sure you discuss any questions you have with your health care provider.  Document Released: 01/08/2005 Document Revised: 05/18/2015 Document Reviewed: 08/07/2013  Elsevier Interactive Patient Education © 2017 Elsevier Inc.

## 2016-05-02 ENCOUNTER — Other Ambulatory Visit: Payer: Self-pay | Admitting: Family Medicine

## 2016-05-02 DIAGNOSIS — E1165 Type 2 diabetes mellitus with hyperglycemia: Secondary | ICD-10-CM | POA: Diagnosis not present

## 2016-05-02 NOTE — Telephone Encounter (Signed)
Caller name: Relationship to patient: Self Can be reached: [phone]  Pharmacy:  [place], [address] [phone] (Phone) [phone] (Fax)     Reason for call: Refill furosemide (LASIX) 20 MG tablet

## 2016-05-03 ENCOUNTER — Other Ambulatory Visit: Payer: Self-pay

## 2016-05-03 MED ORDER — FUROSEMIDE 20 MG PO TABS: 20.0000 mg | ORAL_TABLET | Freq: Two times a day (BID) | ORAL | 0 refills | Status: DC

## 2016-05-03 NOTE — Telephone Encounter (Signed)
Refill done.  

## 2016-05-06 ENCOUNTER — Encounter: Payer: Self-pay | Admitting: Family Medicine

## 2016-05-09 MED FILL — METFORMIN HCL ER 500 MG TAB: 500 | 30 days supply | Qty: 60 | Fill #5

## 2016-05-22 ENCOUNTER — Telehealth: Payer: Self-pay

## 2016-05-22 NOTE — Telephone Encounter (Signed)
-----   Message from Gatha Mayer, MD sent at 05/22/2016  7:43 AM EDT ----- Regarding: earlier appt Please squeeze her in Thursday AM or next week please  Has an appt for later in May I think   Thanks

## 2016-05-22 NOTE — Telephone Encounter (Signed)
Spoke with Jessica Dennis and she will come 05/24/16 at 9:15AM.  She was very happy we can see her sooner.

## 2016-05-23 ENCOUNTER — Ambulatory Visit (HOSPITAL_BASED_OUTPATIENT_CLINIC_OR_DEPARTMENT_OTHER)
Admission: RE | Admit: 2016-05-23 | Discharge: 2016-05-23 | Disposition: A | Discharge status: Home / Self Care | Payer: PPO | Source: Ambulatory Visit | Attending: Family Medicine | Admitting: Family Medicine

## 2016-05-23 DIAGNOSIS — I351 Nonrheumatic aortic (valve) insufficiency: Secondary | ICD-10-CM | POA: Diagnosis not present

## 2016-05-23 DIAGNOSIS — J90 Pleural effusion, not elsewhere classified: Secondary | ICD-10-CM | POA: Diagnosis not present

## 2016-05-23 DIAGNOSIS — R011 Cardiac murmur, unspecified: Secondary | ICD-10-CM | POA: Insufficient documentation

## 2016-05-23 NOTE — Progress Notes (Signed)
  Echocardiogram 2D Echocardiogram has been performed.  Tresa Res 05/23/2016, 10:20 AM

## 2016-05-24 ENCOUNTER — Encounter: Payer: Self-pay | Admitting: Internal Medicine

## 2016-05-24 ENCOUNTER — Other Ambulatory Visit: Payer: Self-pay

## 2016-05-24 ENCOUNTER — Other Ambulatory Visit (INDEPENDENT_AMBULATORY_CARE_PROVIDER_SITE_OTHER): Payer: PPO

## 2016-05-24 ENCOUNTER — Telehealth: Payer: Self-pay | Admitting: Internal Medicine

## 2016-05-24 ENCOUNTER — Ambulatory Visit (INDEPENDENT_AMBULATORY_CARE_PROVIDER_SITE_OTHER): Payer: PPO | Admitting: Internal Medicine

## 2016-05-24 VITALS — BP 110/54 | HR 100 | Ht 66.0 in | Wt 173.5 lb

## 2016-05-24 DIAGNOSIS — Q453 Other congenital malformations of pancreas and pancreatic duct: Secondary | ICD-10-CM

## 2016-05-24 DIAGNOSIS — J90 Pleural effusion, not elsewhere classified: Secondary | ICD-10-CM | POA: Diagnosis not present

## 2016-05-24 DIAGNOSIS — K7031 Alcoholic cirrhosis of liver with ascites: Secondary | ICD-10-CM

## 2016-05-24 DIAGNOSIS — R197 Diarrhea, unspecified: Secondary | ICD-10-CM

## 2016-05-24 DIAGNOSIS — R609 Edema, unspecified: Secondary | ICD-10-CM

## 2016-05-24 DIAGNOSIS — R7989 Other specified abnormal findings of blood chemistry: Secondary | ICD-10-CM

## 2016-05-24 LAB — CBC WITH DIFFERENTIAL/PLATELET
Basophils Absolute: 0.1 10*3/uL (ref 0.0–0.1)
Basophils Relative: 1.1 % (ref 0.0–3.0)
Eosinophils Absolute: 0.1 10*3/uL (ref 0.0–0.7)
Eosinophils Relative: 1.8 % (ref 0.0–5.0)
HCT: 38.7 % (ref 36.0–46.0)
Hemoglobin: 13 g/dL (ref 12.0–15.0)
Lymphocytes Relative: 12.8 % (ref 12.0–46.0)
Lymphs Abs: 1 10*3/uL (ref 0.7–4.0)
MCHC: 33.7 g/dL (ref 30.0–36.0)
MCV: 94.9 fl (ref 78.0–100.0)
Monocytes Absolute: 0.6 10*3/uL (ref 0.1–1.0)
Monocytes Relative: 8.1 % (ref 3.0–12.0)
Neutro Abs: 5.9 10*3/uL (ref 1.4–7.7)
Neutrophils Relative %: 76.2 % (ref 43.0–77.0)
Platelets: 149 10*3/uL — ABNORMAL LOW (ref 150.0–400.0)
RBC: 4.08 Mil/uL (ref 3.87–5.11)
RDW: 13.4 % (ref 11.5–15.5)
WBC: 7.8 10*3/uL (ref 4.0–10.5)

## 2016-05-24 LAB — COMPREHENSIVE METABOLIC PANEL
ALT: 12 U/L (ref 0–35)
AST: 21 U/L (ref 0–37)
Albumin: 3.3 g/dL — ABNORMAL LOW (ref 3.5–5.2)
Alkaline Phosphatase: 104 U/L (ref 39–117)
BUN: 12 mg/dL (ref 6–23)
CO2: 31 mEq/L (ref 19–32)
Calcium: 8.9 mg/dL (ref 8.4–10.5)
Chloride: 100 mEq/L (ref 96–112)
Creatinine, Ser: 0.88 mg/dL (ref 0.40–1.20)
GFR: 65.48 mL/min (ref >60.00)
Glucose, Bld: 233 mg/dL — ABNORMAL HIGH (ref 70–99)
Potassium: 3.3 mEq/L — ABNORMAL LOW (ref 3.5–5.1)
Sodium: 138 mEq/L (ref 135–145)
Total Bilirubin: 1.8 mg/dL — ABNORMAL HIGH (ref 0.2–1.2)
Total Protein: 5.9 g/dL — ABNORMAL LOW (ref 6.0–8.3)

## 2016-05-24 LAB — PROTIME-INR
INR: 1.4 ratio — ABNORMAL HIGH (ref 0.8–1.0)
Prothrombin Time: 14.7 s — ABNORMAL HIGH (ref 9.6–13.1)

## 2016-05-24 LAB — AMMONIA: Ammonia: 30 umol/L (ref 11–35)

## 2016-05-24 MED ORDER — SPIRONOLACTONE 50 MG PO TABS: 100.0000 mg | ORAL_TABLET | Freq: Every day | ORAL | 5 refills | Status: DC

## 2016-05-24 MED ORDER — FUROSEMIDE 40 MG PO TABS: ORAL_TABLET | ORAL | 5 refills | Status: DC

## 2016-05-24 NOTE — Progress Notes (Signed)
Dr. Carlean Purl see the warnings with her spironolactone and accupril as well as potassium.  Ok to send in?

## 2016-05-24 NOTE — Progress Notes (Signed)
Jessica Dennis 83 y.o. Jun 03, 1932 [id number]  Assessment & Plan:   Encounter Diagnoses  Name Primary?  . Alcoholic cirrhosis of liver with ascites ([employer]) Yes  . Increased ammonia level   . Peripheral edema   . Pleural effusion on right   . Acute diarrhea   . Abnormality of pancreatic duct CT scan and ultrasound 2016     Patient now has decompensated alcoholic cirrhosis with ascites. An elevated ammonia level though no clinical evidence of hepatic encephalopathy. Total body volume overload though. Cardiac function on echocardiogram recently is normal. 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  Acute diarrhea that started today unclear etiology question infectious.  Plans are for her to have labs. They have returned.  Lab Results  Component Value Date   CREATININE 0.88 05/24/2016   BUN 12 05/24/2016   NA 138 05/24/2016   K 3.3 (L) 05/24/2016   CL 100 05/24/2016   CO2 31 05/24/2016   Lab Results  Component Value Date   ALT 12 05/24/2016   AST 21 05/24/2016   ALKPHOS 104 05/24/2016   BILITOT 1.8 (H) 05/24/2016   Lab Results  Component Value Date   INR 1.4 (H) 05/24/2016   INR 1.3 (H) 08/10/2014   Lab Results  Component Value Date   WBC 7.8 05/24/2016   HGB 13.0 05/24/2016   HCT 38.7 05/24/2016   MCV 94.9 05/24/2016   PLT 149.0 (L) 05/24/2016   NH3 is 30 Alpha-fetoprotein is pending  So I don't think she is encephalopathic the ammonia that was sent earlier with a level of 70 was probably improperly processed.  Have advised supportive care for the diarrhea at this time. Loperamide when necessary. If this persists can do workup.  Start furosemide at 40 mg every morning and spironolactone at 100 mg every morning. We'll hold this tomorrow as she is going to have a paracentesis. She can start this the following day. Hold her Accupril for the time being, follow-up blood pressures at home or in the office here, and  recheck labs soon. Will arrange pending the paracentesis and those studies which will include albumin, amylase, cell count with differential, culture and cytology.  She is going to need an upper endoscopy to see if she has developed esophageal varices. She did not have those in 2016. Note that she has a diagnosis of "bleeding gastric varices" which is incorrect on the problem list and past medical history. It is associated with Feraheme infusion orders and I cannot remove it but I have requested help with that. Think she has iron deficiency anemia from chronic occult blood loss perhaps due to AVMs as I did see one in the colon.  I think perhaps before even do the endoscopy a CT scan of the abdomen and pelvis might provide useful information. Other option would be MRI. I have a little bit concerned given her drinking history in this focal dilation of the pancreatic duct that she could've developed a pancreatic cancer. I suspect the ascites is really related to cirrhosis but her hyperglycemia raises some issues regarding the pancreas. She does have diabetes but sometimes that can worsen in the setting of a pancreatic mass. I don't think her ultrasound was sensitive enough for that.  She has stopped drinking about 6 weeks ago, unfortunately the damages done. Hopefully medication can control the decompensated signs and symptoms.  May need to stop meloxicam.  I appreciate the opportunity to care for this  patient. CC: Ann Held, DO  Dr. Jacelyn Pi Dr. Burney Gauze  Subjective:   Chief Complaint: Swelling ascites and edema and diarrhea  HPI The patient is an 83 year old woman I know from previous visits, colonoscopy and EGD, and she has developed abdominal ascites and peripheral edema. The patient is here with her daughter-in-law today who works at [employer] She was a chronic alcohol user and declined to stop, she has cirrhosis, and large amount of ascites on recent  ultrasound. No focal liver lesions. His having trouble breathing with the ascites. When she bends over it's worse. Her legs are uncomfortable and is difficult to walk due to the edema. She has been instructed to increase her diuretics from 20 mg furosemide to 20 mg followed by 40 mg in the evening. Today she started having urgent watery diarrhea with lower abdominal cramping, stools are tanned. Acute onset no sick contacts no new medications. No nausea and vomiting. No bleeding. She has stopped drinking about 6 weeks ago. "I should've listen to you and done this sooner" Wt Readings from Last 3 Encounters:  05/24/16 173 lb 8 oz (78.7 kg)  04/30/16 177 lb (80.3 kg)  04/19/16 178 lb 12.8 oz (81.1 kg)   Current Meds  Medication Sig  . ACCU-CHEK FASTCLIX LANCETS MISC Check Blood sugar once daily. Dx:E11.9  . Ascorbic Acid (VITAMIN C) 1000 MG tablet Take 1,000 mg by mouth daily.  . Calcium Carbonate-Vitamin D 600-200 MG-UNIT CAPS Take by mouth.  . ferrous sulfate 325 (65 FE) MG tablet Take 325 mg by mouth.  . latanoprost (XALATAN) 0.005 % ophthalmic solution Place 1 drop into both eyes at bedtime.   Marland Kitchen levothyroxine (SYNTHROID, LEVOTHROID) 75 MCG tablet Take 1 tablet (75 mcg total) by mouth daily.  Marland Kitchen LORazepam (ATIVAN) 0.5 MG tablet Take 1 tablet (0.5 mg total) by mouth daily as needed.  . Melatonin 5 MG CAPS Take 5 mg by mouth.  . meloxicam (MOBIC) 7.5 MG tablet Take 1 tablet (7.5 mg total) by mouth 2 (two) times daily.  . metFORMIN (GLUCOPHAGE-XR) 500 MG 24 hr tablet TAKE 1 TABLET BY MOUTH 2 TIMES DAILY.  Marland Kitchen Omega-3 Fatty Acids (FISH OIL) 1000 MG CAPS Take by mouth every morning.  Glory Rosebush DELICA LANCETS FINE MISC Check blood sugar once daily. Dx:E11.9  . ONETOUCH VERIO test strip Check blood sugar once daily. Dx:E11.9  . pantoprazole (PROTONIX) 40 MG tablet Take 1 tablet by mouth  daily  . potassium chloride SA (KLOR-CON M20) 20 MEQ tablet Take 1 tablet (20 mEq total) by mouth daily.  .  Prednicarbate 0.1 % CREA Apply topically 2 (two) times daily.   . quinapril (ACCUPRIL) 40 MG tablet Take 1 tablet (40 mg total) by mouth daily.  . simvastatin (ZOCOR) 20 MG tablet Take 1 tablet (20 mg total) by mouth at bedtime.  . [DISCONTINUED] furosemide (LASIX) 20 MG tablet Take 1 tablet (20 mg total) by mouth 2 (two) times daily. (Patient taking differently: Take 20 mg by mouth 2 (two) times daily. Take 1 tablet in the morning and 2 at night)   No Known Allergies Past Medical History:  Diagnosis Date  . Anemia   . Angiodysplasia of ascending colon 10/25/2014  . Anxiety   . Arthritis   . Borderline diabetes   . Cancer of the skin, basal cell 09/03/2012  . Cirrhosis ([place])   . Diabetes mellitus without complication ([place])   . Diverticular disease   . GERD (gastroesophageal reflux disease)   .  Hypertension   . Iron deficiency anemia due to chronic blood loss 01/19/2015  . Thyroid disease    Past Surgical History:  Procedure Laterality Date  . ABDOMINAL HYSTERECTOMY    . APPENDECTOMY    . COLONOSCOPY    . ESOPHAGOGASTRODUODENOSCOPY    . LEG SKIN LESION  BIOPSY / EXCISION  12/11/14  . MOHS SURGERY     ankle  . TONSILLECTOMY    . TOTAL HIP ARTHROPLASTY Bilateral 1993, 2006   family history includes Bladder Cancer in her father; Diabetes in her mother; Heart attack in her father. Social History   Social History  . Marital status: Divorced    Spouse name: N/A  . Number of children: 2  . Years of education: N/A   Occupational History  . retired    Social History Main Topics  . Smoking status: Never Smoker  . Smokeless tobacco: Never Used     Comment: NEVER USED TOBACCO  . Alcohol use 1.2 oz/week    2 Standard drinks or equivalent per week     Comment: glass of wine daily  . Drug use: No  . Sexual activity: No   Other Topics Concern  . None   Social History Narrative   The patient is divorced. She is originally from [place]. She has 2 sons. 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   08/10/2014        Review of Systems She is weak and somewhat tired. No fevers. All other review of systems are negative.  Objective:   Physical Exam @BP  (!) 110/54 (BP Location: Left Arm, Patient Position: Sitting, Cuff Size: Normal)   Pulse 100   Ht 5\' 6"  (1.676 m)   Wt 173 lb 8 oz (78.7 kg)   BMI 28.00 kg/m @  General:  Elderly, somewhat chronically ill but in no acute distress. Looks well overall considering. Eyes:  anicteric. ENT:   Mouth and posterior pharynx free of lesions.  Neck:   supple w/o thyromegaly or mass.  Lungs: Clear to auscultation bilaterally. Heart:  S1S2, no rubs, murmurs, gallops. Abdomen:  large ascites non-tender, no hepatosplenomegaly, hernia, or mass and BS+.   Lymph:  no cervical or supraclavicular adenopathy. Extremities:  2+ edema, cyanosis or clubbing Skin  + spiders, patchy erythema/dermatitis stasis Neuro:  A&O x 3. No asterixis - serial presidents ok Psych:  appropriate mood and  Affect.   Data Reviewed: Previous EGD and colonoscopy in EMR. Hyperplastic rectal polyp, diverticulosis and AVM in the ascending colon on colonoscopy and a normal EGD Primary care notes. Ultrasound report and images from April of this year. Labs in the EMR. CT abdomen and pelvis 2016 showing splenomegaly, cirrhosis changes recanalization of the umbilical vein and varices around the stomach and GE junction area she had a focal dilation the pancreatic duct up to 7 mm to

## 2016-05-24 NOTE — Progress Notes (Signed)
Ammonia level is fine - suspect there was a processing error with last specimen  K slightly low - glucose high also otherwise labs pretty good one lab pending - AFP will f/u  Changes to meds as follows - to start Saturday - do not take furosemide tomorrow - having paracentesis  1) Furosemide 40 mg qAM 40 mg tabs w/ 5 RF 2) Spironolactone100 mg qAM 50 mg tabs # 60 w/ 5 RF 3) DC current furosemide regimen 4) further plans to follow after I see paracentesis results

## 2016-05-24 NOTE — Telephone Encounter (Signed)
See labs results on 05/24/16.  New rx sent.

## 2016-05-24 NOTE — Progress Notes (Signed)
BP's have been ok and the furosemide increase and spironolactone may help lower it Ask her to hold the Acupril - start these meds and take BP at home if she is able. I anticipate labs soon again anyway and can get a BP then too but if she can take at home and let me know if > 281 systolic or 90 diastolic frequently we can address

## 2016-05-24 NOTE — Progress Notes (Signed)
Will hold acupril and Rx

## 2016-05-24 NOTE — Patient Instructions (Addendum)
Your physician has requested that you go to the basement for lab work before leaving today.  We will call you with results and plans after they are reviewed.  You may take over the counter Imodium for your diarrhea.   You will have a paracentesis 05/25/16 at 2:00pm at [facility].  Please arrive at 1:45pm.     Follow up with Dr Carlean Purl in 2 months.   I appreciate the opportunity to care for you. Silvano Rusk, MD, [facility]

## 2016-05-25 ENCOUNTER — Ambulatory Visit (HOSPITAL_COMMUNITY)
Admission: RE | Admit: 2016-05-25 | Discharge: 2016-05-25 | Disposition: A | Discharge status: Home / Self Care | Payer: PPO | Source: Ambulatory Visit | Attending: Internal Medicine | Admitting: Internal Medicine

## 2016-05-25 ENCOUNTER — Encounter (HOSPITAL_COMMUNITY): Payer: Self-pay | Admitting: Radiology

## 2016-05-25 ENCOUNTER — Other Ambulatory Visit: Payer: Self-pay | Admitting: Hematology & Oncology

## 2016-05-25 ENCOUNTER — Other Ambulatory Visit: Payer: Self-pay | Admitting: Internal Medicine

## 2016-05-25 DIAGNOSIS — R188 Other ascites: Secondary | ICD-10-CM | POA: Diagnosis not present

## 2016-05-25 DIAGNOSIS — K7460 Unspecified cirrhosis of liver: Secondary | ICD-10-CM | POA: Insufficient documentation

## 2016-05-25 DIAGNOSIS — K7031 Alcoholic cirrhosis of liver with ascites: Secondary | ICD-10-CM

## 2016-05-25 HISTORY — PX: IR PARACENTESIS: IMG2679

## 2016-05-25 LAB — BODY FLUID CELL COUNT WITH DIFFERENTIAL
Eos, Fluid: NONE SEEN %
Lymphs, Fluid: 29 %
Monocyte-Macrophage-Serous Fluid: 53 % (ref 50–90)
Neutrophil Count, Fluid: 18 % (ref 0–25)
Total Nucleated Cell Count, Fluid: 241 cu mm (ref 0–1000)

## 2016-05-25 LAB — ALBUMIN, PLEURAL OR PERITONEAL FLUID: Albumin, Fluid: <1 g/dL

## 2016-05-25 LAB — AMYLASE, PLEURAL OR PERITONEAL FLUID: Amylase, Fluid: 12 U/L

## 2016-05-25 LAB — GRAM STAIN

## 2016-05-25 LAB — AFP TUMOR MARKER: AFP-Tumor Marker: 3.1 ng/mL (ref <6.1)

## 2016-05-25 MED ORDER — LIDOCAINE HCL (PF) 1 % IJ SOLN
INTRAMUSCULAR | Status: DC | PRN
  Administered 2016-05-25: 10 mL

## 2016-05-25 MED ORDER — LIDOCAINE HCL 1 % IJ SOLN
INTRAMUSCULAR | Status: AC
  Filled 2016-05-25: qty 20

## 2016-05-25 NOTE — Procedures (Signed)
Ultrasound-guided diagnostic and therapeutic paracentesis performed yielding 5.9 liters of slightly hazy, yellow fluid. No immediate complications. The fluid was submitted to the lab for preordered studies.

## 2016-05-28 NOTE — Progress Notes (Signed)
Results ok so far My Chart note

## 2016-05-30 LAB — CULTURE, BODY FLUID-BOTTLE: Culture: NO GROWTH

## 2016-06-02 ENCOUNTER — Encounter: Payer: Self-pay | Admitting: Internal Medicine

## 2016-06-04 ENCOUNTER — Other Ambulatory Visit: Payer: Self-pay

## 2016-06-04 ENCOUNTER — Ambulatory Visit: Payer: PPO | Admitting: Internal Medicine

## 2016-06-04 DIAGNOSIS — E7800 Pure hypercholesterolemia, unspecified: Secondary | ICD-10-CM | POA: Diagnosis not present

## 2016-06-04 DIAGNOSIS — E039 Hypothyroidism, unspecified: Secondary | ICD-10-CM | POA: Diagnosis not present

## 2016-06-04 DIAGNOSIS — K7460 Unspecified cirrhosis of liver: Secondary | ICD-10-CM | POA: Diagnosis not present

## 2016-06-04 DIAGNOSIS — I10 Essential (primary) hypertension: Secondary | ICD-10-CM | POA: Diagnosis not present

## 2016-06-04 DIAGNOSIS — K7031 Alcoholic cirrhosis of liver with ascites: Secondary | ICD-10-CM

## 2016-06-04 DIAGNOSIS — E1165 Type 2 diabetes mellitus with hyperglycemia: Secondary | ICD-10-CM | POA: Diagnosis not present

## 2016-06-04 NOTE — Progress Notes (Signed)
No cancer in fluid Please ask her how she is doing - I sent a My Chart message We need to get a BMET

## 2016-06-04 NOTE — Progress Notes (Signed)
1 

## 2016-06-05 ENCOUNTER — Ambulatory Visit (HOSPITAL_BASED_OUTPATIENT_CLINIC_OR_DEPARTMENT_OTHER): Payer: PPO | Admitting: Hematology & Oncology

## 2016-06-05 ENCOUNTER — Other Ambulatory Visit (HOSPITAL_BASED_OUTPATIENT_CLINIC_OR_DEPARTMENT_OTHER): Payer: PPO

## 2016-06-05 VITALS — BP 122/63 | HR 92 | Temp 98.3°F | Resp 18 | Wt 155.8 lb

## 2016-06-05 DIAGNOSIS — D509 Iron deficiency anemia, unspecified: Secondary | ICD-10-CM | POA: Diagnosis not present

## 2016-06-05 DIAGNOSIS — D6959 Other secondary thrombocytopenia: Secondary | ICD-10-CM | POA: Diagnosis not present

## 2016-06-05 DIAGNOSIS — D696 Thrombocytopenia, unspecified: Secondary | ICD-10-CM | POA: Diagnosis not present

## 2016-06-05 DIAGNOSIS — D500 Iron deficiency anemia secondary to blood loss (chronic): Secondary | ICD-10-CM | POA: Diagnosis not present

## 2016-06-05 DIAGNOSIS — R161 Splenomegaly, not elsewhere classified: Secondary | ICD-10-CM

## 2016-06-05 DIAGNOSIS — K7460 Unspecified cirrhosis of liver: Secondary | ICD-10-CM | POA: Diagnosis not present

## 2016-06-05 LAB — CMP (CANCER CENTER ONLY)
ALT(SGPT): 19 U/L (ref 10–47)
AST: 25 U/L (ref 11–38)
Albumin: 3 g/dL — ABNORMAL LOW (ref 3.3–5.5)
Alkaline Phosphatase: 108 U/L — ABNORMAL HIGH (ref 26–84)
BUN, Bld: 11 mg/dL (ref 7–22)
CO2: 29 mEq/L (ref 18–33)
Calcium: 8.9 mg/dL (ref 8.0–10.3)
Chloride: 102 mEq/L (ref 98–108)
Creat: 0.9 mg/dl (ref 0.6–1.2)
Glucose, Bld: 249 mg/dL — ABNORMAL HIGH (ref 73–118)
Potassium: 4 mEq/L (ref 3.3–4.7)
Sodium: 137 mEq/L (ref 128–145)
Total Bilirubin: 1.4 mg/dl (ref 0.20–1.60)
Total Protein: 6 g/dL — ABNORMAL LOW (ref 6.4–8.1)

## 2016-06-05 LAB — CBC WITH DIFFERENTIAL (CANCER CENTER ONLY)
BASO#: 0 10*3/uL (ref 0.0–0.2)
BASO%: 0.6 % (ref 0.0–2.0)
EOS%: 3.5 % (ref 0.0–7.0)
Eosinophils Absolute: 0.2 10*3/uL (ref 0.0–0.5)
HCT: 40.2 % (ref 34.8–46.6)
HGB: 13.5 g/dL (ref 11.6–15.9)
LYMPH#: 0.8 10*3/uL — ABNORMAL LOW (ref 0.9–3.3)
LYMPH%: 15.2 % (ref 14.0–48.0)
MCH: 31.6 pg (ref 26.0–34.0)
MCHC: 33.6 g/dL (ref 32.0–36.0)
MCV: 94 fL (ref 81–101)
MONO#: 0.4 10*3/uL (ref 0.1–0.9)
MONO%: 8.4 % (ref 0.0–13.0)
NEUT#: 3.7 10*3/uL (ref 1.5–6.5)
NEUT%: 72.3 % (ref 39.6–80.0)
Platelets: 110 10*3/uL — ABNORMAL LOW (ref 145–400)
RBC: 4.27 10*6/uL (ref 3.70–5.32)
RDW: 12.4 % (ref 11.1–15.7)
WBC: 5.1 10*3/uL (ref 3.9–10.0)

## 2016-06-05 NOTE — Progress Notes (Signed)
Hematology and Oncology Follow Up Visit  Jessica Dennis [id number] [birth year] 83 y.o. 06/05/2016   Principle Diagnosis:  Chronic thrombocytopenia secondary to cirrhosis and splenomegaly Iron deficiency anemia secondary to GI bleed   Current Therapy:   Observation    Interim History:  Jessica Dennis is here today for a follow-up. She now has cirrhosis. She has ascites. She underwent a paracentesis. This was done on May 4. She said that she had 549 L of fluid removed. Thankfully, the fluid was a transudate. The cytology on the fluid was negative for malignancy.  She is following gastroenterology for this.  She is on diuretics.  Her last iron studies that were done back in February showed a ferritin of 90 with iron saturation of 28%.  She's had no obvious bleeding. Her last upper endoscopy was done back in 2016. There were no varices at that time.  She's had no fever. She's had no cough.  She does have diabetes. I have to believe that her cirrhosis is probably from the basis of steatohepatitis that is just progressing. I'm not sure blood sugars all that well controlled. She now seen an endocrinologist.  Currently, her performance status is ECOG 1.  Medications:  Allergies as of 06/05/2016   No Known Allergies     Medication List       Accurate as of 06/05/16 12:28 PM. Always use your most recent med list.          ACCU-CHEK FASTCLIX LANCETS Misc Check Blood sugar once daily. KC:L27.5   ONETOUCH DELICA LANCETS FINE Misc Check blood sugar once daily. Dx:E11.9   Calcium Carbonate-Vitamin D 600-200 MG-UNIT Caps Take by mouth.   ferrous sulfate 325 (65 FE) MG tablet Take 325 mg by mouth.   Fish Oil 1000 MG Caps Take by mouth every morning.   furosemide 40 MG tablet Commonly known as:  LASIX Please take 40 mg by mouth daily In the am   latanoprost 0.005 % ophthalmic solution Commonly known as:  XALATAN Place 1 drop into both eyes at bedtime.   levothyroxine 75 MCG  tablet Commonly known as:  SYNTHROID, LEVOTHROID Take 1 tablet (75 mcg total) by mouth daily.   LORazepam 0.5 MG tablet Commonly known as:  ATIVAN Take 1 tablet (0.5 mg total) by mouth daily as needed.   Melatonin 5 MG Caps Take 5 mg by mouth.   meloxicam 7.5 MG tablet Commonly known as:  MOBIC Take 1 tablet (7.5 mg total) by mouth 2 (two) times daily.   metFORMIN 500 MG 24 hr tablet Commonly known as:  GLUCOPHAGE-XR TAKE 1 TABLET BY MOUTH 2 TIMES DAILY.   ONETOUCH VERIO test strip Generic drug:  glucose blood Check blood sugar once daily. Dx:E11.9   pantoprazole 40 MG tablet Commonly known as:  PROTONIX Take 1 tablet by mouth  daily   potassium chloride SA 20 MEQ tablet Commonly known as:  KLOR-CON M20 Take 1 tablet (20 mEq total) by mouth daily.   Prednicarbate 0.1 % Crea Apply topically 2 (two) times daily.   quinapril 40 MG tablet Commonly known as:  ACCUPRIL Take 1 tablet (40 mg total) by mouth daily.   simvastatin 20 MG tablet Commonly known as:  ZOCOR Take 1 tablet (20 mg total) by mouth at bedtime.   spironolactone 50 MG tablet Commonly known as:  ALDACTONE Take 2 tablets (100 mg total) by mouth daily.   vitamin C 1000 MG tablet Take 1,000 mg by mouth daily.  Allergies: No Known Allergies  Past Medical History, Surgical history, Social history, and Family History were reviewed and updated.  Review of Systems: All other 10 point review of systems is negative.   Physical Exam:  weight is 155 lb 12.8 oz (70.7 kg). Her oral temperature is 98.3 F (36.8 C). Her blood pressure is 122/63 and her pulse is 92. Her respiration is 18 and oxygen saturation is 97%.   Wt Readings from Last 3 Encounters:  06/05/16 155 lb 12.8 oz (70.7 kg)  05/24/16 173 lb 8 oz (78.7 kg)  04/30/16 177 lb (80.3 kg)    Head and neck exam shows no ocular or oral lesions. There are no palpable cervical or supraclavicular lymph nodes. There is no scleral icterus. Lungs are  clear bilaterally. Cardiac exam regular rate and rhythm with no murmurs, rubs or bruits. Abdomen is slightly distended. Abdomen is soft. It is hard to tell there is no actual fluid wave. There is no guarding or rebound tenderness. There is no palpable liver or spleen tip. Extremities shows some trace edema in her lower legs. She does have a little of a rash on her lower legs which appears to be from diabetes. Neurological exam shows some decreased sensation in her feet.   Lab Results  Component Value Date   WBC 7.8 05/24/2016   HGB 13.0 05/24/2016   HCT 38.7 05/24/2016   MCV 94.9 05/24/2016   PLT 149.0 (L) 05/24/2016   Lab Results  Component Value Date   FERRITIN 90 03/05/2016   IRON 75 03/05/2016   TIBC 268 03/05/2016   UIBC 193 03/05/2016   IRONPCTSAT 28 03/05/2016   Lab Results  Component Value Date   RBC 4.08 05/24/2016   No results found for: KPAFRELGTCHN, LAMBDASER, KAPLAMBRATIO No results found for: IGGSERUM, IGA, IGMSERUM No results found for: Odetta Pink, SPEI   Chemistry      Component Value Date/Time   NA 138 05/24/2016 1102   NA 139 03/05/2016 1139   K 3.3 (L) 05/24/2016 1102   K 3.6 03/05/2016 1139   CL 100 05/24/2016 1102   CL 101 05/23/2015 1421   CO2 31 05/24/2016 1102   CO2 26 03/05/2016 1139   BUN 12 05/24/2016 1102   BUN 14.3 03/05/2016 1139   CREATININE 0.88 05/24/2016 1102   CREATININE 0.61 04/19/2016 1643   CREATININE 0.7 03/05/2016 1139      Component Value Date/Time   CALCIUM 8.9 05/24/2016 1102   CALCIUM 9.3 03/05/2016 1139   ALKPHOS 104 05/24/2016 1102   ALKPHOS 127 03/05/2016 1139   AST 21 05/24/2016 1102   AST 29 03/05/2016 1139   ALT 12 05/24/2016 1102   ALT 22 03/05/2016 1139   BILITOT 1.8 (H) 05/24/2016 1102   BILITOT 1.75 (H) 03/05/2016 1139     Impression and Plan: Jessica Dennis is a very pleasant 82 yo white female with chronic thrombocytopenia.  Her blood counts actually  look better.  We will had to be very cautious with respect to iron deficiency on her. With her cirrhosis, we have to be careful with iron infusions.  From my point of view, is now much that we need to do right now.  We probably will get her back in 4 months. We will send a back sooner if she has additional issues.   I spent about 30 minutes with her.  Volanda Napoleon, MD 5/15/201812:28 PM

## 2016-06-06 ENCOUNTER — Encounter: Payer: Self-pay | Admitting: *Deleted

## 2016-06-06 LAB — IRON AND TIBC
%SAT: 28 % (ref 21–57)
Iron: 67 ug/dL (ref 41–142)
TIBC: 243 ug/dL (ref 236–444)
UIBC: 175 ug/dL (ref 120–384)

## 2016-06-06 LAB — FERRITIN: Ferritin: 38 ng/ml (ref 9–269)

## 2016-06-06 LAB — RETICULOCYTES: Reticulocyte Count: 1.2 % (ref 0.6–2.6)

## 2016-06-06 MED FILL — METFORMIN HCL ER 500 MG TAB: 500 | 90 days supply | Qty: 180 | Fill #0

## 2016-06-07 ENCOUNTER — Telehealth: Payer: Self-pay | Admitting: Family Medicine

## 2016-06-07 DIAGNOSIS — F411 Generalized anxiety disorder: Secondary | ICD-10-CM

## 2016-06-07 MED ORDER — LORAZEPAM 0.5 MG PO TABS: 0.5000 mg | ORAL_TABLET | Freq: Every day | ORAL | 2 refills | Status: DC | PRN

## 2016-06-07 MED FILL — LORazepam 0.5 MG TABS: 0.5 | 90 days supply | Qty: 90 | Fill #0 | Status: TO

## 2016-06-07 NOTE — Telephone Encounter (Signed)
Printed pcp signed and will fax to [facility]

## 2016-06-07 NOTE — Telephone Encounter (Signed)
Refill x1   2 refills 

## 2016-06-07 NOTE — Telephone Encounter (Signed)
Requesting:   Lorazepam Contract    12/05/2015 UDS   Low risk---next is due on 06/03/2016 Last OV   04/30/16---future appt is on 10/26/16 Last Refill   #90 no refills on 02/24/2016  Please Advise----[phone]

## 2016-06-19 ENCOUNTER — Other Ambulatory Visit: Payer: Self-pay | Admitting: Family Medicine

## 2016-06-19 DIAGNOSIS — D696 Thrombocytopenia, unspecified: Secondary | ICD-10-CM

## 2016-06-19 MED FILL — ONE TOUCH ULTRA TEST STRIPS: 50 days supply | Qty: 50 | Fill #0

## 2016-06-20 DIAGNOSIS — I10 Essential (primary) hypertension: Secondary | ICD-10-CM | POA: Diagnosis not present

## 2016-06-20 DIAGNOSIS — E7800 Pure hypercholesterolemia, unspecified: Secondary | ICD-10-CM | POA: Diagnosis not present

## 2016-06-20 DIAGNOSIS — E039 Hypothyroidism, unspecified: Secondary | ICD-10-CM | POA: Diagnosis not present

## 2016-06-20 DIAGNOSIS — E1165 Type 2 diabetes mellitus with hyperglycemia: Secondary | ICD-10-CM | POA: Diagnosis not present

## 2016-06-29 DIAGNOSIS — M7541 Impingement syndrome of right shoulder: Secondary | ICD-10-CM | POA: Diagnosis not present

## 2016-06-29 DIAGNOSIS — M25511 Pain in right shoulder: Secondary | ICD-10-CM | POA: Diagnosis not present

## 2016-07-02 MED FILL — ACCU-CHEK FASTCLIX LANCETS: 102 days supply | Qty: 102 | Fill #0

## 2016-07-20 ENCOUNTER — Encounter: Payer: Self-pay | Admitting: Internal Medicine

## 2016-07-20 ENCOUNTER — Other Ambulatory Visit (INDEPENDENT_AMBULATORY_CARE_PROVIDER_SITE_OTHER): Payer: PPO

## 2016-07-20 ENCOUNTER — Ambulatory Visit (INDEPENDENT_AMBULATORY_CARE_PROVIDER_SITE_OTHER): Payer: PPO | Admitting: Internal Medicine

## 2016-07-20 VITALS — BP 94/60 | HR 82 | Ht 67.0 in | Wt 148.0 lb

## 2016-07-20 DIAGNOSIS — K7031 Alcoholic cirrhosis of liver with ascites: Secondary | ICD-10-CM | POA: Diagnosis not present

## 2016-07-20 LAB — BASIC METABOLIC PANEL
BUN: 25 mg/dL — ABNORMAL HIGH (ref 6–23)
CO2: 29 mEq/L (ref 19–32)
Calcium: 9.8 mg/dL (ref 8.4–10.5)
Chloride: 101 mEq/L (ref 96–112)
Creatinine, Ser: 1.09 mg/dL (ref 0.40–1.20)
GFR: 51.13 mL/min — ABNORMAL LOW (ref >60.00)
Glucose, Bld: 159 mg/dL — ABNORMAL HIGH (ref 70–99)
Potassium: 4.3 mEq/L (ref 3.5–5.1)
Sodium: 136 mEq/L (ref 135–145)

## 2016-07-20 NOTE — Progress Notes (Signed)
Jessica Dennis 83 y.o. 1932-07-10 [id number]  Assessment & Plan:   Encounter Diagnosis  Name Primary?  . Alcoholic cirrhosis of liver with ascites ([place]) Yes    She is much improved all overall. She needs an EGD to screen for varices. We'll follow-up labs with electro-lites and kidney function. She can consolidate the doses of her diuretics in the morning she does not have to split these. Continue abstinence from alcohol.  The risks and benefits as well as alternatives of endoscopic procedure(s) have been discussed and reviewed. All questions answered. The patient agrees to proceed.   I appreciate the opportunity to care for this patient. CC: Ann Held, DO   Subjective:   Chief Complaint:Follow-up of cirrhosis and ascites  HPI The patient is here she has lost 25 pounds after starting diuretics and having a paracentesis. She feels much better. She continues to be abstinent from alcohol. She is taking her furosemide in the morning and her spironolactone at night so she is urinating at night and this is disrupting her sleep.  Wt Readings from Last 3 Encounters:  07/20/16 148 lb (67.1 kg)  06/05/16 155 lb 12.8 oz (70.7 kg)  05/24/16 173 lb 8 oz (78.7 kg)    No Known Allergies Current Meds  Medication Sig  . ACCU-CHEK FASTCLIX LANCETS MISC CHECK BLOOD SUGAR ONCE DAILY.  Marland Kitchen Ascorbic Acid (VITAMIN C) 1000 MG tablet Take 1,000 mg by mouth daily.  . Calcium Carbonate-Vitamin D 600-200 MG-UNIT CAPS Take by mouth.  . ferrous sulfate 325 (65 FE) MG tablet Take 325 mg by mouth.  . furosemide (LASIX) 40 MG tablet Please take 40 mg by mouth daily In the am  . latanoprost (XALATAN) 0.005 % ophthalmic solution Place 1 drop into both eyes at bedtime.   Marland Kitchen levothyroxine (SYNTHROID, LEVOTHROID) 75 MCG tablet Take 1 tablet (75 mcg total) by mouth daily.  Marland Kitchen LORazepam (ATIVAN) 0.5 MG tablet Take 1 tablet (0.5 mg total) by mouth daily as needed.  . Melatonin 5 MG CAPS Take 5 mg by  mouth.  . meloxicam (MOBIC) 7.5 MG tablet Take 1 tablet (7.5 mg total) by mouth 2 (two) times daily.  . metFORMIN (GLUCOPHAGE-XR) 500 MG 24 hr tablet TAKE 1 TABLET BY MOUTH 2 TIMES DAILY.  Marland Kitchen Omega-3 Fatty Acids (FISH OIL) 1000 MG CAPS Take by mouth every morning.  . ONE TOUCH ULTRA TEST test strip CHECK BLOOD SUGAR ONCE DAILY.  Marland Kitchen ONETOUCH DELICA LANCETS FINE MISC Check blood sugar once daily. Dx:E11.9  . ONETOUCH VERIO test strip Check blood sugar once daily. Dx:E11.9  . pantoprazole (PROTONIX) 40 MG tablet Take 1 tablet by mouth  daily  . potassium chloride SA (KLOR-CON M20) 20 MEQ tablet Take 1 tablet (20 mEq total) by mouth daily.  . Prednicarbate 0.1 % CREA Apply topically 2 (two) times daily.   . quinapril (ACCUPRIL) 40 MG tablet Take 1 tablet (40 mg total) by mouth daily.  . simvastatin (ZOCOR) 20 MG tablet Take 1 tablet (20 mg total) by mouth at bedtime.  Marland Kitchen spironolactone (ALDACTONE) 50 MG tablet Take 2 tablets (100 mg total) by mouth daily.   Past Medical History:  Diagnosis Date  . Anemia   . Angiodysplasia of ascending colon 10/25/2014  . Anxiety   . Arthritis   . Borderline diabetes   . Cancer of the skin, basal cell 09/03/2012  . Cirrhosis ([place])   . Diabetes mellitus without complication ([place])   . Diverticular disease   . GERD (  gastroesophageal reflux disease)   . Hypertension   . Iron deficiency anemia due to chronic blood loss 01/19/2015  . Thyroid disease    Past Surgical History:  Procedure Laterality Date  . ABDOMINAL HYSTERECTOMY    . APPENDECTOMY    . COLONOSCOPY    . ESOPHAGOGASTRODUODENOSCOPY    . IR PARACENTESIS  05/25/2016  . LEG SKIN LESION  BIOPSY / EXCISION  12/11/14  . MOHS SURGERY     ankle  . TONSILLECTOMY    . TOTAL HIP ARTHROPLASTY Bilateral 1993, 2006   Social History   Social History  . Marital status: Divorced    Spouse name: N/A  . Number of children: 2  . Years of education: N/A   Occupational History  . retired    Social History  Main Topics  . Smoking status: Never Smoker  . Smokeless tobacco: Never Used     Comment: NEVER USED TOBACCO  . Alcohol use 1.2 oz/week    2 Standard drinks or equivalent per week     Comment: glass of wine daily  . Drug use: No  . Sexual activity: No   Other Topics Concern  . Not on file   Social History Narrative   The patient is divorced. She is originally from [place]. She has 2 sons. 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   08/10/2014      family history includes Bladder Cancer in her father; Diabetes in her mother; Heart attack in her father.   Review of Systems As above. Energy level is better.  Objective:   Physical Exam @BP  94/60   Pulse 82   Ht 5\' 7"  (1.702 m)   Wt 148 lb (67.1 kg)   BMI 23.18 kg/m @  General:  NAD Eyes:   anicteric Lungs:  clear Heart::  S1S2 no rubs, murmurs or gallops Abdomen:  soft and nontender, BS+, ? Mild ascites Ext:   trace edema, cyanosis or clubbing A and o x3 and no asterixis    Data Reviewed:   As per history of present illness

## 2016-07-20 NOTE — Patient Instructions (Addendum)
Your physician has requested that you go to the basement for the following lab work before leaving today: CMET  You have been scheduled for an endoscopy. Please follow written instructions given to you at your visit today. If you use inhalers (even only as needed), please bring them with you on the day of your procedure.  I appreciate the opportunity to care for you. Silvano Rusk, MD, [facility]

## 2016-07-26 NOTE — Progress Notes (Signed)
Labs ok except hyperglycemia and BUN up - latter expected with diuretics  Will send My Chart note and discuss further 7/12 EGD

## 2016-08-02 ENCOUNTER — Encounter: Payer: Self-pay | Admitting: Internal Medicine

## 2016-08-02 ENCOUNTER — Ambulatory Visit (AMBULATORY_SURGERY_CENTER): Payer: PPO | Admitting: Internal Medicine

## 2016-08-02 VITALS — BP 129/74 | HR 105 | Temp 98.0°F | Resp 15 | Ht 67.0 in | Wt 148.0 lb

## 2016-08-02 DIAGNOSIS — K3189 Other diseases of stomach and duodenum: Secondary | ICD-10-CM | POA: Diagnosis not present

## 2016-08-02 DIAGNOSIS — K7031 Alcoholic cirrhosis of liver with ascites: Secondary | ICD-10-CM | POA: Insufficient documentation

## 2016-08-02 DIAGNOSIS — K766 Portal hypertension: Secondary | ICD-10-CM | POA: Diagnosis not present

## 2016-08-02 DIAGNOSIS — K7030 Alcoholic cirrhosis of liver without ascites: Secondary | ICD-10-CM | POA: Diagnosis not present

## 2016-08-02 DIAGNOSIS — Z09 Encounter for follow-up examination after completed treatment for conditions other than malignant neoplasm: Secondary | ICD-10-CM | POA: Diagnosis not present

## 2016-08-02 HISTORY — DX: Other diseases of stomach and duodenum: K76.6

## 2016-08-02 HISTORY — DX: Portal hypertension: K31.89

## 2016-08-02 MED ORDER — SODIUM CHLORIDE 0.9 % IV SOLN: 500.0000 mL | INTRAVENOUS | Status: DC

## 2016-08-02 NOTE — Op Note (Signed)
[place] Patient Name: Jessica Dennis Procedure Date: 08/02/2016 11:55 AM MRN: [id number] Endoscopist: Gatha Mayer , MD Age: 83 Referring MD:  Date of Birth: June 24, 1932 Gender: Female Account #: [id number] Procedure:                Upper GI endoscopy Indications:              Cirrhosis rule out esophageal varices Medicines:                Propofol per Anesthesia, Monitored Anesthesia Care Procedure:                Pre-Anesthesia Assessment:                           - Prior to the procedure, a History and Physical                            was performed, and patient medications and                            allergies were reviewed. The patient's tolerance of                            previous anesthesia was also reviewed. The risks                            and benefits of the procedure and the sedation                            options and risks were discussed with the patient.                            All questions were answered, and informed consent                            was obtained. Prior Anticoagulants: The patient has                            taken no previous anticoagulant or antiplatelet                            agents. ASA Grade Assessment: III - A patient with                            severe systemic disease. After reviewing the risks                            and benefits, the patient was deemed in                            satisfactory condition to undergo the procedure.                           After obtaining informed consent, the endoscope was  passed under direct vision. Throughout the                            procedure, the patient's blood pressure, pulse, and                            oxygen saturations were monitored continuously. The                            Endoscope was introduced through the mouth, and                            advanced to the second part of duodenum. The         patient tolerated the procedure well. The upper GI                            endoscopy was accomplished without difficulty. Scope In: Scope Out: Findings:                 Mild portal hypertensive gastropathy was found in                            the entire examined stomach.                           The exam was otherwise without abnormality.                           The cardia and gastric fundus were normal on                            retroflexion. Complications:            No immediate complications. Estimated Blood Loss:     Estimated blood loss: none. Impression:               - Portal hypertensive gastropathy. Mild - it is                            possible she has some telangiectatic lesions ? mild                            GAVE in antrum                           - The examination was otherwise normal.                           - No specimens collected. Recommendation:           - Patient has a contact number available for                            emergencies. The signs and symptoms of potential                            delayed  complications were discussed with the                            patient. Return to normal activities tomorrow.                            Written discharge instructions were provided to the                            patient.                           - Resume previous diet.                           - Continue present medications. Continue iron Tx                            through Dr. Marin Olp also                           - Repeat upper endoscopy in 1 year for screening                            purposes. She has decompensated cirrhosis.                           - Return to GI clinic November or December 2018. Gatha Mayer, MD 08/02/2016 12:16:38 PM This report has been signed electronically.

## 2016-08-02 NOTE — Patient Instructions (Addendum)
I did not see any swollen veins (varices) but there is evidence of changed blood flow through the stomach called portal gastropathy. It is unlikely to cause a problem.  We should look inside again in about 1 year.  Please call me in October to get an appointment for November or December.  I recommend restarting insulin tonight at usual time.  I appreciate the opportunity to care for you. Gatha Mayer, MD, FACG YOU HAD AN ENDOSCOPIC PROCEDURE TODAY AT [facility]:   Refer to the procedure report that was given to you for any specific questions about what was found during the examination.  If the procedure report does not answer your questions, please call your gastroenterologist to clarify.  If you requested that your care partner not be given the details of your procedure findings, then the procedure report has been included in a sealed envelope for you to review at your convenience later.  YOU SHOULD EXPECT: Some feelings of bloating in the abdomen. Passage of more gas than usual.  Walking can help get rid of the air that was put into your GI tract during the procedure and reduce the bloating. If you had a lower endoscopy (such as a colonoscopy or flexible sigmoidoscopy) you may notice spotting of blood in your stool or on the toilet paper. If you underwent a bowel prep for your procedure, you may not have a normal bowel movement for a few days.  Please Note:  You might notice some irritation and congestion in your nose or some drainage.  This is from the oxygen used during your procedure.  There is no need for concern and it should clear up in a day or so.  SYMPTOMS TO REPORT IMMEDIATELY:   Following upper endoscopy (EGD)  Vomiting of blood or coffee ground material  New chest pain or pain under the shoulder blades  Painful or persistently difficult swallowing  New shortness of breath  Fever of 100F or higher  Black, tarry-looking stools  For urgent or  emergent issues, a gastroenterologist can be reached at any hour by calling [phone].   DIET:  We do recommend a small meal at first, but then you may proceed to your regular diet.  Drink plenty of fluids but you should avoid alcoholic beverages for 24 hours.  MEDICATIONS:  Continue present medications. Continue Iron therapy through Dr. Marin Olp.  Return to GI clinic in November or December 2018.  Repeat upper endoscopy in 1 year for screening purposes.  ACTIVITY:  You should plan to take it easy for the rest of today and you should NOT DRIVE or use heavy machinery until tomorrow (because of the sedation medicines used during the test).    FOLLOW UP: Our staff will call the number listed on your records the next business day following your procedure to check on you and address any questions or concerns that you may have regarding the information given to you following your procedure. If we do not reach you, we will leave a message.  However, if you are feeling well and you are not experiencing any problems, there is no need to return our call.  We will assume that you have returned to your regular daily activities without incident.  If any biopsies were taken you will be contacted by phone or by letter within the next 1-3 weeks.  Please call us at [phone] if you have not heard about the biopsies in 3 weeks.   Thank  for you for allowing [place] to provide for your healthcare needs today.  SIGNATURES/CONFIDENTIALITY: You and/or your care partner have signed paperwork which will be entered into your electronic medical record.  These signatures attest to the fact that that the information above on your After Visit Summary has been reviewed and is understood.  Full responsibility of the confidentiality of this discharge information lies with you and/or your care-partner.

## 2016-08-02 NOTE — Progress Notes (Signed)
Dental advisory given to [place] and oriented x3, pleased with MAC, report to RN [facility]

## 2016-08-03 ENCOUNTER — Telehealth: Payer: Self-pay | Admitting: *Deleted

## 2016-08-03 NOTE — Telephone Encounter (Signed)
  Follow up Call-  Call back number 08/02/2016 10/25/2014  Post procedure Call Back phone  # [phone]  Permission to leave phone message Yes Yes  Some recent data might be hidden     Patient questions:  Do you have a fever, pain , or abdominal swelling? No. Pain Score  0 *  Have you tolerated food without any problems? Yes.    Have you been able to return to your normal activities? Yes.    Do you have any questions about your discharge instructions: Diet   No. Medications  No. Follow up visit  No.  Do you have questions or concerns about your Care? No.  Actions: * If pain score is 4 or above: No action needed, pain <4.

## 2016-08-20 ENCOUNTER — Other Ambulatory Visit: Payer: Self-pay | Admitting: Family Medicine

## 2016-09-19 DIAGNOSIS — E7800 Pure hypercholesterolemia, unspecified: Secondary | ICD-10-CM | POA: Diagnosis not present

## 2016-09-19 DIAGNOSIS — E1165 Type 2 diabetes mellitus with hyperglycemia: Secondary | ICD-10-CM | POA: Diagnosis not present

## 2016-09-19 DIAGNOSIS — I10 Essential (primary) hypertension: Secondary | ICD-10-CM | POA: Diagnosis not present

## 2016-09-19 DIAGNOSIS — E1140 Type 2 diabetes mellitus with diabetic neuropathy, unspecified: Secondary | ICD-10-CM | POA: Diagnosis not present

## 2016-09-19 DIAGNOSIS — K7460 Unspecified cirrhosis of liver: Secondary | ICD-10-CM | POA: Diagnosis not present

## 2016-09-19 DIAGNOSIS — E039 Hypothyroidism, unspecified: Secondary | ICD-10-CM | POA: Diagnosis not present

## 2016-09-25 DIAGNOSIS — D485 Neoplasm of uncertain behavior of skin: Secondary | ICD-10-CM | POA: Diagnosis not present

## 2016-09-25 DIAGNOSIS — Z85828 Personal history of other malignant neoplasm of skin: Secondary | ICD-10-CM | POA: Diagnosis not present

## 2016-09-25 DIAGNOSIS — L578 Other skin changes due to chronic exposure to nonionizing radiation: Secondary | ICD-10-CM | POA: Diagnosis not present

## 2016-09-25 DIAGNOSIS — L570 Actinic keratosis: Secondary | ICD-10-CM | POA: Diagnosis not present

## 2016-09-25 DIAGNOSIS — L821 Other seborrheic keratosis: Secondary | ICD-10-CM | POA: Diagnosis not present

## 2016-09-25 DIAGNOSIS — C44629 Squamous cell carcinoma of skin of left upper limb, including shoulder: Secondary | ICD-10-CM | POA: Diagnosis not present

## 2016-09-25 DIAGNOSIS — Z08 Encounter for follow-up examination after completed treatment for malignant neoplasm: Secondary | ICD-10-CM | POA: Diagnosis not present

## 2016-09-27 ENCOUNTER — Ambulatory Visit (INDEPENDENT_AMBULATORY_CARE_PROVIDER_SITE_OTHER): Payer: PPO | Admitting: Family Medicine

## 2016-09-27 ENCOUNTER — Ambulatory Visit (HOSPITAL_BASED_OUTPATIENT_CLINIC_OR_DEPARTMENT_OTHER)
Admission: RE | Admit: 2016-09-27 | Discharge: 2016-09-27 | Disposition: A | Discharge status: Home / Self Care | Payer: PPO | Source: Ambulatory Visit | Attending: Family Medicine | Admitting: Family Medicine

## 2016-09-27 ENCOUNTER — Encounter: Payer: Self-pay | Admitting: Family Medicine

## 2016-09-27 VITALS — BP 106/68 | HR 133 | Temp 97.7°F | Ht 67.0 in | Wt 151.4 lb

## 2016-09-27 DIAGNOSIS — S4991XA Unspecified injury of right shoulder and upper arm, initial encounter: Secondary | ICD-10-CM

## 2016-09-27 DIAGNOSIS — M25511 Pain in right shoulder: Secondary | ICD-10-CM | POA: Diagnosis not present

## 2016-09-27 DIAGNOSIS — M19011 Primary osteoarthritis, right shoulder: Secondary | ICD-10-CM | POA: Diagnosis not present

## 2016-09-27 NOTE — Patient Instructions (Signed)
Adhesive Capsulitis Adhesive capsulitis is inflammation of the tendons and ligaments that surround the shoulder joint (shoulder capsule). This condition causes the shoulder to become stiff and painful to move. Adhesive capsulitis is also called frozen shoulder. What are the causes? This condition may be caused by:  An injury to the shoulder joint.  Straining the shoulder.  Not moving the shoulder for a period of time. This can happen if your arm was injured or in a sling.  Long-standing health problems, such as: ? Diabetes. ? Thyroid problems. ? Heart disease. ? Stroke. ? Rheumatoid arthritis. ? Lung disease.  In some cases, the cause may not be known. What increases the risk? This condition is more likely to develop in:  Women.  People who are older than 81 years of age.  What are the signs or symptoms? Symptoms of this condition include:  Pain in the shoulder when moving the arm. There may also be pain when parts of the shoulder are touched. The pain is worse at night or when at rest.  Soreness or aching in the shoulder.  Inability to move the shoulder normally.  Muscle spasms.  How is this diagnosed? This condition is diagnosed with a physical exam and imaging tests, such as an X-ray or MRI. How is this treated? This condition may be treated with:  Treatment of the underlying cause or condition.  Physical therapy. This involves performing exercises to get the shoulder moving again.  Medicine. Medicine may be given to relieve pain, inflammation, or muscle spasms.  Steroid injections into the shoulder joint.  Shoulder manipulation. This is a procedure to move the shoulder into another position. It is done after you are given a medicine to make you fall asleep (general anesthetic). The joint may also be injected with salt water at high pressure to break down scarring.  Surgery. This may be done in severe cases when other treatments have failed.  Although most  people recover completely from adhesive capsulitis, some may not regain the full movement of the shoulder. Follow these instructions at home:  Take over-the-counter and prescription medicines only as told by your health care provider.  If you are being treated with physical therapy, follow instructions from your physical therapist.  Avoid exercises that put a lot of demand on your shoulder, such as throwing. These exercises can make pain worse.  If directed, apply ice to the injured area: ? Put ice in a plastic bag. ? Place a towel between your skin and the bag. ? Leave the ice on for 20 minutes, 2-3 times per day. Contact a health care provider if:  You develop new symptoms.  Your symptoms get worse. This information is not intended to replace advice given to you by your health care provider. Make sure you discuss any questions you have with your health care provider. Document Released: 11/05/2008 Document Revised: 06/16/2015 Document Reviewed: 05/03/2014 Elsevier Interactive Patient Education  2018 [employer]  

## 2016-09-27 NOTE — Progress Notes (Signed)
Patient ID: Jessica Dennis, female    DOB: 30-Jun-1932  Age: 83 y.o. MRN: [id number]    Subjective:  Subjective   HPI Jessica Dennis presents for r shoulder pain -- no injury.  She has seen Dr Alvan Dame and dx with Rotator cuff tear.    Review of Systems  Constitutional: Negative for activity change, appetite change, fatigue and unexpected weight change.  Respiratory: Negative for cough and shortness of breath.   Cardiovascular: Negative for chest pain and palpitations.  Musculoskeletal: Positive for arthralgias. Negative for neck pain and neck stiffness.  Psychiatric/Behavioral: Negative for behavioral problems and dysphoric mood. The patient is not nervous/anxious.     History Past Medical History:  Diagnosis Date  . Anemia   . Angiodysplasia of ascending colon 10/25/2014  . Anxiety   . Arthritis   . Borderline diabetes   . Cancer of the skin, basal cell 09/03/2012  . Cirrhosis ([place])   . Diabetes mellitus without complication ([place])   . Diverticular disease   . GERD (gastroesophageal reflux disease)   . Hypertension   . Iron deficiency anemia due to chronic blood loss 01/19/2015  . Portal hypertensive gastropathy ([place]) 08/02/2016   ? Some GAVE also  . Thyroid disease     She has a past surgical history that includes Abdominal hysterectomy; Total hip arthroplasty (Bilateral, 1993, 2006); Appendectomy; Tonsillectomy; Colonoscopy; Leg skin lesion  biopsy / excision (12/11/14); Mohs surgery; Esophagogastroduodenoscopy; IR Paracentesis (05/25/2016); and Upper gastrointestinal endoscopy.   Her family history includes Bladder Cancer in her father; Diabetes in her mother; Heart attack in her father.She reports that she has never smoked. She has never used smokeless tobacco. She reports that she does not drink alcohol or use drugs.  Current Outpatient Prescriptions on File Prior to Visit  Medication Sig Dispense Refill  . ACCU-CHEK FASTCLIX LANCETS MISC CHECK BLOOD SUGAR ONCE DAILY. 102 each  12  . Ascorbic Acid (VITAMIN C) 1000 MG tablet Take 1,000 mg by mouth daily.    . Calcium Carbonate-Vitamin D 600-200 MG-UNIT CAPS Take by mouth.    . ferrous sulfate 325 (65 FE) MG tablet Take 325 mg by mouth.    . furosemide (LASIX) 40 MG tablet Please take 40 mg by mouth daily In the am 30 tablet 5  . latanoprost (XALATAN) 0.005 % ophthalmic solution Place 1 drop into both eyes at bedtime.     Marland Kitchen levothyroxine (SYNTHROID, LEVOTHROID) 75 MCG tablet Take 1 tablet (75 mcg total) by mouth daily. 90 tablet 1  . LORazepam (ATIVAN) 0.5 MG tablet Take 1 tablet (0.5 mg total) by mouth daily as needed. 90 tablet 2  . Melatonin 5 MG CAPS Take 5 mg by mouth.    . meloxicam (MOBIC) 7.5 MG tablet Take 1 tablet (7.5 mg total) by mouth 2 (two) times daily. 180 tablet 1  . NOVOLOG MIX 70/30 FLEXPEN (70-30) 100 UNIT/ML FlexPen     . Omega-3 Fatty Acids (FISH OIL) 1000 MG CAPS Take by mouth every morning.    . ONE TOUCH ULTRA TEST test strip CHECK BLOOD SUGAR ONCE DAILY. 50 each 12  . ONETOUCH VERIO test strip Check blood sugar once daily. Dx:E11.9 100 each 12  . pantoprazole (PROTONIX) 40 MG tablet Take 1 tablet by mouth  daily 90 tablet 1  . potassium chloride SA (KLOR-CON M20) 20 MEQ tablet Take 1 tablet (20 mEq total) by mouth daily. 90 tablet 1  . Prednicarbate 0.1 % CREA Apply topically 2 (two) times daily.     Marland Kitchen  quinapril (ACCUPRIL) 40 MG tablet Take 1 tablet (40 mg total) by mouth daily. 90 tablet 1  . simvastatin (ZOCOR) 20 MG tablet Take 1 tablet (20 mg total) by mouth at bedtime. 90 tablet 1  . spironolactone (ALDACTONE) 50 MG tablet Take 2 tablets (100 mg total) by mouth daily. 60 tablet 5  . ONETOUCH DELICA LANCETS FINE MISC Check blood sugar once daily. Dx:E11.9 (Patient not taking: Reported on 09/27/2016) 100 each 12   Current Facility-Administered Medications on File Prior to Visit  Medication Dose Route Frequency Provider Last Rate Last Dose  . 0.9 %  sodium chloride infusion  500 mL  Intravenous Continuous Gatha Mayer, MD         Objective:  Objective  Physical Exam  Musculoskeletal: She exhibits tenderness.       Right shoulder: She exhibits decreased range of motion, tenderness and pain. She exhibits no swelling and no effusion.  Nursing note and vitals reviewed.  BP 106/68 (BP Location: Right Arm, Patient Position: Sitting, Cuff Size: Normal)   Pulse (!) 133   Temp 97.7 F (36.5 C) (Oral)   Ht 5\' 7"  (1.702 m)   Wt 151 lb 6.4 oz (68.7 kg)   SpO2 98%   BMI 23.71 kg/m  Wt Readings from Last 3 Encounters:  09/27/16 151 lb 6.4 oz (68.7 kg)  08/02/16 148 lb (67.1 kg)  07/20/16 148 lb (67.1 kg)     Lab Results  Component Value Date   WBC 5.1 06/05/2016   HGB 13.5 06/05/2016   HCT 40.2 06/05/2016   PLT 110 (L) 06/05/2016   GLUCOSE 159 (H) 07/20/2016   CHOL 135 03/13/2016   TRIG 72.0 03/13/2016   HDL 49.70 03/13/2016   LDLCALC 71 03/13/2016   ALT 19 06/05/2016   AST 25 06/05/2016   NA 136 07/20/2016   K 4.3 07/20/2016   CL 101 07/20/2016   CREATININE 1.09 07/20/2016   BUN 25 (H) 07/20/2016   CO2 29 07/20/2016   TSH 3.63 04/19/2016   INR 1.4 (H) 05/24/2016   HGBA1C 5.9 03/13/2016   MICROALBUR 0.2 06/02/2015    Ir Paracentesis  Result Date: 05/25/2016 INDICATION: Cirrhosis, ascites. Request made for diagnostic and therapeutic paracentesis. EXAM: ULTRASOUND GUIDED DIAGNOSTIC AND THERAPEUTIC PARACENTESIS MEDICATIONS: None. COMPLICATIONS: None immediate. PROCEDURE: Informed written consent was obtained from the patient after a discussion of the risks, benefits and alternatives to treatment. A timeout was performed prior to the initiation of the procedure. Initial ultrasound scanning demonstrates a large amount of ascites within the left lower abdominal quadrant. The left lower abdomen was prepped and draped in the usual sterile fashion. 1% lidocaine was used for local anesthesia. Following this, a Yueh catheter was introduced. An ultrasound image was  saved for documentation purposes. The paracentesis was performed. The catheter was removed and a dressing was applied. The patient tolerated the procedure well without immediate post procedural complication. FINDINGS: A total of approximately 5.9 liters of hazy, yellow fluid was removed. Samples were sent to the laboratory as requested by the clinical team. IMPRESSION: Successful ultrasound-guided diagnostic and therapeutic paracentesis yielding 5.9 liters of peritoneal fluid. Read by: Rowe Robert, PA-C Electronically Signed   By: Aletta Edouard M.D.   On: 05/25/2016 16:19     Assessment & Plan:  Plan  I am having Ms. Crady maintain her Prednicarbate, latanoprost, Fish Oil, vitamin C, ONETOUCH VERIO, ONETOUCH DELICA LANCETS FINE, ferrous sulfate, Calcium Carbonate-Vitamin D, Melatonin, simvastatin, pantoprazole, meloxicam, levothyroxine, potassium chloride SA, quinapril, furosemide,  spironolactone, LORazepam, ONE TOUCH ULTRA TEST, ACCU-CHEK FASTCLIX LANCETS, and NOVOLOG MIX 70/30 FLEXPEN. We will continue to administer sodium chloride.  No orders of the defined types were placed in this encounter.   Problem List Items Addressed This Visit    None    Visit Diagnoses    Injury of right shoulder, initial encounter    -  Primary   Relevant Orders   Ambulatory referral to Physical Therapy   DG Shoulder Right (Completed)    consider MRI---  F/u Dr Alvan Dame  Follow-up: Return if symptoms worsen or fail to improve.  Ann Held, DO

## 2016-10-01 DIAGNOSIS — E119 Type 2 diabetes mellitus without complications: Secondary | ICD-10-CM | POA: Diagnosis not present

## 2016-10-01 DIAGNOSIS — H2513 Age-related nuclear cataract, bilateral: Secondary | ICD-10-CM | POA: Diagnosis not present

## 2016-10-01 DIAGNOSIS — H5203 Hypermetropia, bilateral: Secondary | ICD-10-CM | POA: Diagnosis not present

## 2016-10-01 DIAGNOSIS — H401132 Primary open-angle glaucoma, bilateral, moderate stage: Secondary | ICD-10-CM | POA: Diagnosis not present

## 2016-10-03 DIAGNOSIS — M25511 Pain in right shoulder: Secondary | ICD-10-CM | POA: Diagnosis not present

## 2016-10-03 DIAGNOSIS — M159 Polyosteoarthritis, unspecified: Secondary | ICD-10-CM | POA: Diagnosis not present

## 2016-10-03 DIAGNOSIS — R531 Weakness: Secondary | ICD-10-CM | POA: Diagnosis not present

## 2016-10-03 DIAGNOSIS — M6250 Muscle wasting and atrophy, not elsewhere classified, unspecified site: Secondary | ICD-10-CM | POA: Diagnosis not present

## 2016-10-03 DIAGNOSIS — Z736 Limitation of activities due to disability: Secondary | ICD-10-CM | POA: Diagnosis not present

## 2016-10-03 DIAGNOSIS — M799 Soft tissue disorder, unspecified: Secondary | ICD-10-CM | POA: Diagnosis not present

## 2016-10-03 DIAGNOSIS — I10 Essential (primary) hypertension: Secondary | ICD-10-CM | POA: Diagnosis not present

## 2016-10-03 DIAGNOSIS — M25811 Other specified joint disorders, right shoulder: Secondary | ICD-10-CM | POA: Diagnosis not present

## 2016-10-03 DIAGNOSIS — E118 Type 2 diabetes mellitus with unspecified complications: Secondary | ICD-10-CM | POA: Diagnosis not present

## 2016-10-04 DIAGNOSIS — M7541 Impingement syndrome of right shoulder: Secondary | ICD-10-CM | POA: Diagnosis not present

## 2016-10-09 ENCOUNTER — Encounter: Payer: Self-pay | Admitting: Dietician

## 2016-10-09 ENCOUNTER — Encounter: Payer: PPO | Attending: Endocrinology | Admitting: Dietician

## 2016-10-09 DIAGNOSIS — Z713 Dietary counseling and surveillance: Secondary | ICD-10-CM | POA: Insufficient documentation

## 2016-10-09 DIAGNOSIS — E1165 Type 2 diabetes mellitus with hyperglycemia: Secondary | ICD-10-CM

## 2016-10-09 DIAGNOSIS — E119 Type 2 diabetes mellitus without complications: Secondary | ICD-10-CM | POA: Diagnosis not present

## 2016-10-09 DIAGNOSIS — E1151 Type 2 diabetes mellitus with diabetic peripheral angiopathy without gangrene: Secondary | ICD-10-CM

## 2016-10-09 DIAGNOSIS — IMO0002 Reserved for concepts with insufficient information to code with codable children: Secondary | ICD-10-CM

## 2016-10-09 NOTE — Progress Notes (Signed)
Diabetes Self-Management Education  Visit Type: First/Initial  Appt. Start Time: 1110 Appt. End Time:   7902    10/09/2016  [name], identified by name and date of birth, is a 83 y.o. female with a diagnosis of Diabetes: Type 2. Other hx includes HTN, hypercholesterolemia, hypothyroid, neuropathy, and cirrhosis.  She is followed by Dr. Chalmers Cater. Medications include:  Novolog 70/30 35 units in the am and 25 units at HS.  She only checks her blood sugar fasting in the morning and is concerned because it is always high.  She reports not hypoglycemia but this is a concern with her last A1C of 5.5% one month ago per patient.  Patient lives alone.  She went to college in [place] and has been in the [place] since.  She drives and does her own shopping and enjoys cooking.  She is retired from [employer].  She now feels that she does not have a purpose but does keep an active social life.  She would like to be more active but struggles with foot pain and shoulder pain as well as hx of hip replacements and knee problems.    Overall she is very mindful about her diet.  She avoids added sugars and is moderate with her carbohydrate intake but does not have protein with breakfast and skips or delays lunch at times.  Her meals are generally inconsistent depending on her wake time and activities. She would like to learn more about label reading and carbohydrates.  ASSESSMENT  Height 5\' 7"  (1.702 m), weight 153 lb (69.4 kg). Body mass index is 23.96 kg/m.      Diabetes Self-Management Education - 10/09/16 1127      Visit Information   Visit Type First/Initial     Initial Visit   Diabetes Type Type 2   Are you currently following a meal plan? No   Are you taking your medications as prescribed? Yes   Date Diagnosed 2014     Health Coping   How would you rate your overall health? Fair     Psychosocial Assessment   Patient Belief/Attitude about Diabetes Motivated to manage diabetes   Self-care  barriers None   Self-management support Doctor's office   Other persons present Patient   Patient Concerns Nutrition/Meal planning   Special Needs None   Preferred Learning Style No preference indicated   Learning Readiness Ready   How often do you need to have someone help you when you read instructions, pamphlets, or other written materials from your doctor or pharmacy? 1 - Never   What is the last grade level you completed in school? 4 years college     Pre-Education Assessment   Patient understands the diabetes disease and treatment process. Needs Review   Patient understands incorporating nutritional management into lifestyle. Needs Review   Patient undertands incorporating physical activity into lifestyle. Needs Review   Patient understands using medications safely. Needs Review   Patient understands monitoring blood glucose, interpreting and using results Needs Review   Patient understands prevention, detection, and treatment of acute complications. Needs Review   Patient understands prevention, detection, and treatment of chronic complications. Needs Review   Patient understands how to develop strategies to address psychosocial issues. Needs Review   Patient understands how to develop strategies to promote health/change behavior. Needs Review     Complications   Last HgB A1C per patient/outside source 5.5 %  08/2016   How often do you check your blood sugar? 3-4 times /  week   Fasting Blood glucose range (mg/dL) 180-200;130-179;>200   Number of hypoglycemic episodes per month 0   Number of hyperglycemic episodes per week 6   Can you tell when your blood sugar is high? No   Have you had a dilated eye exam in the past 12 months? Yes   Have you had a dental exam in the past 12 months? Yes   Are you checking your feet? Yes   How many days per week are you checking your feet? 7     Dietary Intake   Breakfast English muffin with homemade sugar free jam, coffee with half and half  and sugar free sweetener from [place]  9-10   Snack (morning) none   Lunch bread, liverworst or boiled egg or meat OR tomato sandwich  inconsistent   Snack (afternoon) none   Dinner goulash, yogurt, whipped cream, berries OR chicken, rice OR corn on the cob and hamburger on bun  loves to cook and hates leftovers   Snack (evening) raw carrots or apple    Beverage(s) unsweetened iced tea with artificial sweetener, diet tonic and lime, water, coffee with cream     Exercise   Exercise Type Light (walking / raking leaves)   How many days per week to you exercise? 7   How many minutes per day do you exercise? 20   Total minutes per week of exercise 140     Patient Education   Previous Diabetes Education No   Disease state  Definition of diabetes, type 1 and 2, and the diagnosis of diabetes   Nutrition management  Role of diet in the treatment of diabetes and the relationship between the three main macronutrients and blood glucose level;Food label reading, portion sizes and measuring food.;Meal options for control of blood glucose level and chronic complications.;Meal timing in regards to the patients' current diabetes medication.   Physical activity and exercise  Role of exercise on diabetes management, blood pressure control and cardiac health.;Helped patient identify appropriate exercises in relation to his/her diabetes, diabetes complications and other health issue.   Medications Reviewed patients medication for diabetes, action, purpose, timing of dose and side effects.   Monitoring Purpose and frequency of SMBG.;Daily foot exams;Identified appropriate SMBG and/or A1C goals.;Yearly dilated eye exam   Acute complications Taught treatment of hypoglycemia - the 15 rule.   Chronic complications Relationship between chronic complications and blood glucose control;Dental care;Assessed and discussed foot care and prevention of foot problems   Psychosocial adjustment Worked with patient to identify  barriers to care and solutions;Role of stress on diabetes;Identified and addressed patients feelings and concerns about diabetes     Individualized Goals (developed by patient)   Nutrition General guidelines for healthy choices and portions discussed   Physical Activity 15 minutes per day;Exercise 5-7 days per week   Medications take my medication as prescribed   Monitoring  test my blood glucose as discussed   Problem Solving Meal schedule   Reducing Risk examine blood glucose patterns;do foot checks daily   Health Coping discuss diabetes with (comment)  MD/RD     Post-Education Assessment   Patient understands the diabetes disease and treatment process. Demonstrates understanding / competency   Patient understands incorporating nutritional management into lifestyle. Demonstrates understanding / competency   Patient undertands incorporating physical activity into lifestyle. Demonstrates understanding / competency   Patient understands using medications safely. Demonstrates understanding / competency   Patient understands monitoring blood glucose, interpreting and using results Demonstrates understanding / competency  Patient understands prevention, detection, and treatment of acute complications. Demonstrates understanding / competency   Patient understands prevention, detection, and treatment of chronic complications. Demonstrates understanding / competency   Patient understands how to develop strategies to address psychosocial issues. Demonstrates understanding / competency   Patient understands how to develop strategies to promote health/change behavior. Demonstrates understanding / competency     Outcomes   Expected Outcomes Demonstrated interest in learning. Expect positive outcomes   Future DMSE PRN   Program Status Completed      Individualized Plan for Diabetes Self-Management Training:   Learning Objective:  Patient will have a greater understanding of diabetes  self-management. Patient education plan is to attend individual and/or group sessions per assessed needs and concerns.   Plan:  Consider getting your vitamin B-12 checked. Avoid skipping meals. Aim for consistent meal times. Choose small amounts of protein with meals and snacks.   Aim for 3 Carb Choices per meal (45 grams) +/- 1 either way  Aim for 0-1 Carbs per snack if hungry  Include protein in moderation with your meals and snacks Consider reading food labels for Total Carbohydrate and Fat Grams of foods Consider  increasing your activity level by walking for 30 minutes daily as tolerated Consider checking BG at alternate times per day as directed by MD  Continue taking medication as directed by MD   Expected Outcomes:  Demonstrated interest in learning. Expect positive outcomes  Education material provided: Living Well with Diabetes, Food label handouts, A1C conversion sheet, Meal plan card, My Plate and Snack sheet  If problems or questions, patient to contact team via:  Phone  Future DSME appointment: PRN

## 2016-10-09 NOTE — Patient Instructions (Addendum)
Consider getting your vitamin B-12 checked. Avoid skipping meals. Aim for consistent meal times. Choose small amounts of protein with meals and snacks.   Aim for 3 Carb Choices per meal (45 grams) +/- 1 either way  Aim for 0-1 Carbs per snack if hungry  Include protein in moderation with your meals and snacks Consider reading food labels for Total Carbohydrate and Fat Grams of foods Consider  increasing your activity level by walking for 30 minutes daily as tolerated Consider checking BG at alternate times per day as directed by MD  Continue taking medication as directed by MD

## 2016-10-10 DIAGNOSIS — M25511 Pain in right shoulder: Secondary | ICD-10-CM | POA: Diagnosis not present

## 2016-10-10 DIAGNOSIS — M75121 Complete rotator cuff tear or rupture of right shoulder, not specified as traumatic: Secondary | ICD-10-CM | POA: Diagnosis not present

## 2016-10-10 DIAGNOSIS — M12811 Other specific arthropathies, not elsewhere classified, right shoulder: Secondary | ICD-10-CM | POA: Diagnosis not present

## 2016-10-26 ENCOUNTER — Encounter: Payer: Self-pay | Admitting: Family Medicine

## 2016-10-26 ENCOUNTER — Ambulatory Visit (INDEPENDENT_AMBULATORY_CARE_PROVIDER_SITE_OTHER): Payer: PPO | Admitting: Family Medicine

## 2016-10-26 VITALS — BP 110/60 | HR 78 | Ht 67.0 in | Wt 158.6 lb

## 2016-10-26 DIAGNOSIS — Z0001 Encounter for general adult medical examination with abnormal findings: Secondary | ICD-10-CM | POA: Diagnosis not present

## 2016-10-26 DIAGNOSIS — I10 Essential (primary) hypertension: Secondary | ICD-10-CM | POA: Diagnosis not present

## 2016-10-26 DIAGNOSIS — R82998 Other abnormal findings in urine: Secondary | ICD-10-CM

## 2016-10-26 DIAGNOSIS — E1165 Type 2 diabetes mellitus with hyperglycemia: Secondary | ICD-10-CM | POA: Diagnosis not present

## 2016-10-26 DIAGNOSIS — E785 Hyperlipidemia, unspecified: Secondary | ICD-10-CM | POA: Diagnosis not present

## 2016-10-26 DIAGNOSIS — E039 Hypothyroidism, unspecified: Secondary | ICD-10-CM | POA: Diagnosis not present

## 2016-10-26 DIAGNOSIS — E1142 Type 2 diabetes mellitus with diabetic polyneuropathy: Secondary | ICD-10-CM

## 2016-10-26 DIAGNOSIS — R8290 Unspecified abnormal findings in urine: Secondary | ICD-10-CM | POA: Diagnosis not present

## 2016-10-26 DIAGNOSIS — Z0000 Encounter for general adult medical examination without abnormal findings: Secondary | ICD-10-CM

## 2016-10-26 LAB — POC URINALSYSI DIPSTICK (AUTOMATED)
Bilirubin, UA: NEGATIVE
Blood, UA: NEGATIVE
Glucose, UA: NEGATIVE
Ketones, UA: NEGATIVE
Nitrite, UA: NEGATIVE
Protein, UA: NEGATIVE
Spec Grav, UA: 1.015 (ref 1.010–1.025)
Urobilinogen, UA: 0.2 E.U./dL
pH, UA: 6 (ref 5.0–8.0)

## 2016-10-26 LAB — MICROALBUMIN / CREATININE URINE RATIO
Creatinine,U: 51.3 mg/dL
Microalb Creat Ratio: 1.4 mg/g (ref 0.0–30.0)
Microalb, Ur: <0.7 mg/dL (ref 0.0–1.9)

## 2016-10-26 LAB — COMPREHENSIVE METABOLIC PANEL
ALT: 34 U/L (ref 0–35)
AST: 32 U/L (ref 0–37)
Albumin: 3.8 g/dL (ref 3.5–5.2)
Alkaline Phosphatase: 70 U/L (ref 39–117)
BUN: 39 mg/dL — ABNORMAL HIGH (ref 6–23)
CO2: 30 mEq/L (ref 19–32)
Calcium: 9.2 mg/dL (ref 8.4–10.5)
Chloride: 100 mEq/L (ref 96–112)
Creatinine, Ser: 1.22 mg/dL — ABNORMAL HIGH (ref 0.40–1.20)
GFR: 44.87 mL/min — ABNORMAL LOW (ref >60.00)
Glucose, Bld: 166 mg/dL — ABNORMAL HIGH (ref 70–99)
Potassium: 4.3 mEq/L (ref 3.5–5.1)
Sodium: 137 mEq/L (ref 135–145)
Total Bilirubin: 0.8 mg/dL (ref 0.2–1.2)
Total Protein: 6.3 g/dL (ref 6.0–8.3)

## 2016-10-26 LAB — CBC WITH DIFFERENTIAL/PLATELET
Basophils Absolute: 0 10*3/uL (ref 0.0–0.1)
Basophils Relative: 0.6 % (ref 0.0–3.0)
Eosinophils Absolute: 0.1 10*3/uL (ref 0.0–0.7)
Eosinophils Relative: 1.2 % (ref 0.0–5.0)
HCT: 31.9 % — ABNORMAL LOW (ref 36.0–46.0)
Hemoglobin: 10.6 g/dL — ABNORMAL LOW (ref 12.0–15.0)
Lymphocytes Relative: 5.4 % — ABNORMAL LOW (ref 12.0–46.0)
Lymphs Abs: 0.5 10*3/uL — ABNORMAL LOW (ref 0.7–4.0)
MCHC: 33.2 g/dL (ref 30.0–36.0)
MCV: 90.1 fl (ref 78.0–100.0)
Monocytes Absolute: 0.6 10*3/uL (ref 0.1–1.0)
Monocytes Relative: 6.5 % (ref 3.0–12.0)
Neutro Abs: 7.6 10*3/uL (ref 1.4–7.7)
Neutrophils Relative %: 86.3 % — ABNORMAL HIGH (ref 43.0–77.0)
Platelets: 63 10*3/uL — ABNORMAL LOW (ref 150.0–400.0)
RBC: 3.54 Mil/uL — ABNORMAL LOW (ref 3.87–5.11)
RDW: 16.3 % — ABNORMAL HIGH (ref 11.5–15.5)
WBC: 8.8 10*3/uL (ref 4.0–10.5)

## 2016-10-26 LAB — LIPID PANEL
Cholesterol: 132 mg/dL (ref 0–200)
HDL: 70.9 mg/dL (ref >39.00)
LDL Cholesterol: 51 mg/dL (ref 0–99)
NonHDL: 61.56
Total CHOL/HDL Ratio: 2
Triglycerides: 55 mg/dL (ref 0.0–149.0)
VLDL: 11 mg/dL (ref 0.0–40.0)

## 2016-10-26 LAB — TSH: TSH: 2.03 u[IU]/mL (ref 0.35–4.50)

## 2016-10-26 LAB — HEMOGLOBIN A1C: Hgb A1c MFr Bld: 6.2 % (ref 4.6–6.5)

## 2016-10-26 MED ORDER — GABAPENTIN 100 MG PO CAPS: ORAL_CAPSULE | ORAL | 2 refills | Status: DC

## 2016-10-26 NOTE — Progress Notes (Signed)
Subjective:   Jessica Dennis is a 83 y.o. female who presents for Medicare Annual (Subsequent) preventive examination. Pt c/o leg cramps and depression and feeling bored.  She can't walk because of her neuropathy.   Pt states she is really not depressed ---just bored-- we discussed her volunteering at hospital or office where she can sit and talk to people  HYPERTENSION   Blood pressure range-not checking   Chest pain- no      Dyspnea- no Lightheadedness- no   Edema- no  Other side effects - no   Medication compliance: good Low salt diet- yes    DIABETES    Blood Sugar ranges-good per pt  Polyuria- no New Visual problems- no  Hypoglycemic symptoms- no  Other side effects-no Medication compliance - good Last eye exam- 10/09/2016--care everywhere Foot exam- today   HYPERLIPIDEMIA  Medication compliance- good RUQ pain- no  Muscle aches- no Other side effects-no      Review of Systems:   Review of Systems  Constitutional: Negative for activity change, appetite change and fatigue.  HENT: Negative for hearing loss, congestion, tinnitus and ear discharge.   Eyes: Negative for visual disturbance (see optho q1y -- vision corrected to 20/20 with glasses).  Respiratory: Negative for cough, chest tightness and shortness of breath.   Cardiovascular: Negative for chest pain, palpitations and leg swelling.  Gastrointestinal: Negative for abdominal pain, diarrhea, constipation and abdominal distention.  Genitourinary: Negative for urgency, frequency, decreased urine volume and difficulty urinating.  Musculoskeletal: Negative for back pain, arthralgias and gait problem.  Skin: Negative for color change, pallor and rash.  Neurological: Negative for dizziness, light-headedness, numbness and headaches.  Hematological: Negative for adenopathy. Does not bruise/bleed easily.  Psychiatric/Behavioral: Negative for suicidal ideas, confusion, sleep disturbance, self-injury, dysphoric mood,  decreased concentration and agitation.  Pt is able to read and write and can do all ADLs No risk for falling No abuse/ violence in home         Objective:     Vitals: BP 110/60   Pulse 78   Ht 5\' 7"  (1.702 m)   Wt 158 lb 9.6 oz (71.9 kg)   SpO2 96%   BMI 24.84 kg/m   Body mass index is 24.84 kg/m. BP 110/60   Pulse 78   Ht 5\' 7"  (1.702 m)   Wt 158 lb 9.6 oz (71.9 kg)   SpO2 96%   BMI 24.84 kg/m  General appearance: alert, cooperative, appears stated age and no distress Head: Normocephalic, without obvious abnormality, atraumatic Eyes: negative findings: lids and lashes normal, conjunctivae and sclerae normal and pupils equal, round, reactive to light and accomodation Ears: normal TM's and external ear canals both ears Nose: Nares normal. Septum midline. Mucosa normal. No drainage or sinus tenderness. Throat: lips, mucosa, and tongue normal; teeth and gums normal Neck: no adenopathy, no carotid bruit, no JVD, supple, symmetrical, trachea midline and thyroid not enlarged, symmetric, no tenderness/mass/nodules Back: symmetric, no curvature. ROM normal. No CVA tenderness. Lungs: clear to auscultation bilaterally Breasts: normal appearance, no masses or tenderness Heart: regular rate and rhythm, S1, S2 normal, no murmur, click, rub or gallop Abdomen: soft, non-tender; bowel sounds normal; no masses,  no organomegaly Pelvic: not indicated; post-menopausal, no abnormal Pap smears in past Extremities: extremities normal, atraumatic, no cyanosis or edema Pulses: 2+ and symmetric Skin: Skin color, texture, turgor normal. No rashes or lesions Lymph nodes: Cervical, supraclavicular, and axillary nodes normal. Neurologic: Alert and oriented X 3, normal strength and tone. Normal  symmetric reflexes. Normal coordination and gait Monofilament-- unable to feel big toes on both feet or any toes on R foot --- rest of exam normal Tobacco History  Smoking Status  . Never Smoker  Smokeless  Tobacco  . Never Used    Comment: NEVER USED TOBACCO     Counseling given: Not Answered   Past Medical History:  Diagnosis Date  . Anemia   . Angiodysplasia of ascending colon 10/25/2014  . Anxiety   . Arthritis   . Borderline diabetes   . Cancer of the skin, basal cell 09/03/2012  . Cirrhosis ([place])   . Diabetes mellitus without complication ([place])   . Diverticular disease   . GERD (gastroesophageal reflux disease)   . Hypertension   . Iron deficiency anemia due to chronic blood loss 01/19/2015  . Portal hypertensive gastropathy ([place]) 08/02/2016   ? Some GAVE also  . Thyroid disease    Past Surgical History:  Procedure Laterality Date  . ABDOMINAL HYSTERECTOMY    . APPENDECTOMY    . COLONOSCOPY    . ESOPHAGOGASTRODUODENOSCOPY    . IR PARACENTESIS  05/25/2016  . LEG SKIN LESION  BIOPSY / EXCISION  12/11/14  . MOHS SURGERY     ankle  . TONSILLECTOMY    . TOTAL HIP ARTHROPLASTY Bilateral 1993, 2006  . UPPER GASTROINTESTINAL ENDOSCOPY     Family History  Problem Relation Age of Onset  . Bladder Cancer Father   . Heart attack Father   . Diabetes Mother   . Colon cancer Neg Hx   . Colon polyps Neg Hx   . Esophageal cancer Neg Hx   . Rectal cancer Neg Hx   . Stomach cancer Neg Hx    History  Sexual Activity  . Sexual activity: No    Outpatient Encounter Prescriptions as of 10/26/2016  Medication Sig  . ACCU-CHEK FASTCLIX LANCETS MISC CHECK BLOOD SUGAR ONCE DAILY.  Marland Kitchen Ascorbic Acid (VITAMIN C) 1000 MG tablet Take 1,000 mg by mouth daily.  . Calcium Carbonate-Vitamin D 600-200 MG-UNIT CAPS Take by mouth.  . Flaxseed, Linseed, (FLAX SEED OIL) 1000 MG CAPS Take by mouth.  . furosemide (LASIX) 40 MG tablet Please take 40 mg by mouth daily In the am  . latanoprost (XALATAN) 0.005 % ophthalmic solution Place 1 drop into both eyes at bedtime.   Marland Kitchen levothyroxine (SYNTHROID, LEVOTHROID) 75 MCG tablet Take 1 tablet (75 mcg total) by mouth daily.  Marland Kitchen LORazepam (ATIVAN) 0.5 MG  tablet Take 1 tablet (0.5 mg total) by mouth daily as needed.  . Melatonin 5 MG CAPS Take 5 mg by mouth.  . meloxicam (MOBIC) 7.5 MG tablet Take 1 tablet (7.5 mg total) by mouth 2 (two) times daily.  Marland Kitchen NOVOLOG MIX 70/30 FLEXPEN (70-30) 100 UNIT/ML FlexPen 60 Units daily. 35 in the morning and 25 at HS  . Omega-3 Fatty Acids (FISH OIL) 1000 MG CAPS Take by mouth every morning.  . ONE TOUCH ULTRA TEST test strip CHECK BLOOD SUGAR ONCE DAILY.  Marland Kitchen ONETOUCH DELICA LANCETS FINE MISC Check blood sugar once daily. Dx:E11.9  . ONETOUCH VERIO test strip Check blood sugar once daily. Dx:E11.9  . pantoprazole (PROTONIX) 40 MG tablet Take 1 tablet by mouth  daily  . potassium chloride SA (KLOR-CON M20) 20 MEQ tablet Take 1 tablet (20 mEq total) by mouth daily.  . Prednicarbate 0.1 % CREA Apply topically 2 (two) times daily.   . simvastatin (ZOCOR) 20 MG tablet Take 1 tablet (20 mg  total) by mouth at bedtime.  Marland Kitchen spironolactone (ALDACTONE) 50 MG tablet Take 2 tablets (100 mg total) by mouth daily.  . vitamin E 100 UNIT capsule Take by mouth daily.  Marland Kitchen gabapentin (NEURONTIN) 100 MG capsule 1 po qhs prn for 3 days then 2 po qhs for 3-4 days then 3 can increase to 3 a day if needed for pain  . [DISCONTINUED] ferrous sulfate 325 (65 FE) MG tablet Take 325 mg by mouth.  . [DISCONTINUED] quinapril (ACCUPRIL) 40 MG tablet Take 1 tablet (40 mg total) by mouth daily. (Patient not taking: Reported on 10/09/2016)   Facility-Administered Encounter Medications as of 10/26/2016  Medication  . 0.9 %  sodium chloride infusion    Activities of Daily Living In your present state of health, do you have any difficulty performing the following activities: 10/26/2016  Hearing? N  Vision? N  Difficulty concentrating or making decisions? N  Walking or climbing stairs? Y  Comment due to neuropathy  Dressing or bathing? N  Doing errands, shopping? N  Some recent data might be hidden    Patient Care Team: Carollee Herter, Alferd Apa, DO as PCP - General (Family Medicine) Linward Natal, MD as Referring Physician (Ophthalmology) Jessica Bison, MD as Referring Physician (Dermatology) Gatha Mayer, MD as Consulting Physician (Gastroenterology) Volanda Napoleon, MD as Consulting Physician (Oncology) Janan Ridge, MD as Consulting Physician (Dermatology) Paralee Cancel, MD as Consulting Physician (Orthopedic Surgery)    Assessment:    cpe  Exercise Activities and Dietary recommendations Current Exercise Habits: The patient does not participate in regular exercise at present, Exercise limited by: neurologic condition(s)  Goals    None     Fall Risk Fall Risk  10/26/2016 10/09/2016 11/25/2015 09/19/2015 06/02/2015  Falls in the past year? Yes No Yes No No  Number falls in past yr: 2 or more - 2 or more - -  Injury with Fall? No - Yes - -  Comment - - - - -  Risk Factor Category  High Fall Risk - High Fall Risk - -  Risk for fall due to : Impaired balance/gait - - - -  Follow up - - Falls prevention discussed - -   Depression Screen PHQ 2/9 Scores 10/26/2016 10/09/2016 06/02/2015 03/09/2015  PHQ - 2 Score 0 0 0 0  PHQ- 9 Score - - - -     Cognitive Function     6CIT Screen 10/26/2016  What Year? 0 points  What month? 0 points  What time? 0 points  Count back from 20 0 points  Months in reverse 0 points    Immunization History  Administered Date(s) Administered  . Hepatitis B, adult 05/12/2014  . Hepatitis B, ped/adol 08/10/2014  . Influenza, High Dose Seasonal PF 10/13/2014  . Influenza, Seasonal, Injecte, Preservative Fre 10/13/2014  . Influenza-Unspecified 11/25/2013, 10/06/2015  . Pneumococcal Conjugate-13 05/17/2014  . Pneumococcal Polysaccharide-23 10/06/2015  . Tdap 10/02/2011  . Zoster 01/22/2005   Screening Tests Health Maintenance  Topic Date Due  . FOOT EXAM  05/12/2015  . COLONOSCOPY  10/25/2015  . URINE MICROALBUMIN  06/01/2016  . HEMOGLOBIN A1C  09/10/2016  . INFLUENZA  VACCINE  10/26/2017 (Originally 08/22/2016)  . OPHTHALMOLOGY EXAM  11/22/2016  . MAMMOGRAM  04/16/2017  . TETANUS/TDAP  10/01/2021  . DEXA SCAN  Completed  . PNA vac Low Risk Adult  Addressed      Plan:   see AVS ghm utd We discussed exercise -- swimming  would be great due to her neuropathy I have personally reviewed and noted the following in the patient's chart:   . Medical and social history . Use of alcohol, tobacco or illicit drugs  . Current medications and supplements . Functional ability and status . Nutritional status . Physical activity . Advanced directives . List of other physicians . Hospitalizations, surgeries, and ER visits in previous 12 months . Vitals . Screenings to include cognitive, depression, and falls . Referrals and appointments  In addition, I have reviewed and discussed with patient certain preventive protocols, quality metrics, and best practice recommendations. A written personalized care plan for preventive services as well as general preventive health recommendations were provided to patient.   1. Preventative health care See avs ghm utd Check labs  2. Uncontrolled type 2 diabetes mellitus with hyperglycemia (HCC) hgba1c acceptable, minimize simple carbs. Increase exercise as tolerated. Continue current meds Per endo  3. Hypothyroidism, unspecified type con't meds Check labs  4. Essential hypertension Well controlled, no changes to meds. Encouraged heart healthy diet such as the DASH diet and exercise as tolerated.    5. Hyperlipidemia LDL goal <70 Encouraged heart healthy diet, increase exercise, avoid trans fats, consider a krill oil cap daily  6. Hyperlipidemia LDL goal <100    7. Essential hypertension, benign Well controlled, no changes to meds. Encouraged heart healthy diet such as the DASH diet and exercise as tolerated.    [place], DO  10/26/2016

## 2016-10-26 NOTE — Assessment & Plan Note (Signed)
Encouraged heart healthy diet, increase exercise, avoid trans fats, consider a krill oil cap daily 

## 2016-10-26 NOTE — Patient Instructions (Addendum)
[place] leg cramps   Preventive Care 81 Years and Older, Female Preventive care refers to lifestyle choices and visits with your health care provider that can promote health and wellness. What does preventive care include?  A yearly physical exam. This is also called an annual well check.  Dental exams once or twice a year.  Routine eye exams. Ask your health care provider how often you should have your eyes checked.  Personal lifestyle choices, including: ? Daily care of your teeth and gums. ? Regular physical activity. ? Eating a healthy diet. ? Avoiding tobacco and drug use. ? Limiting alcohol use. ? Practicing safe sex. ? Taking low-dose aspirin every day. ? Taking vitamin and mineral supplements as recommended by your health care provider. What happens during an annual well check? The services and screenings done by your health care provider during your annual well check will depend on your age, overall health, lifestyle risk factors, and family history of disease. Counseling Your health care provider may ask you questions about your:  Alcohol use.  Tobacco use.  Drug use.  Emotional well-being.  Home and relationship well-being.  Sexual activity.  Eating habits.  History of falls.  Memory and ability to understand (cognition).  Work and work Statistician.  Reproductive health.  Screening You may have the following tests or measurements:  Height, weight, and BMI.  Blood pressure.  Lipid and cholesterol levels. These may be checked every 5 years, or more frequently if you are over 81 years old.  Skin check.  Lung cancer screening. You may have this screening every year starting at age 81 if you have a 30-pack-year history of smoking and currently smoke or have quit within the past 15 years.  Fecal occult blood test (FOBT) of the stool. You may have this test every year starting at age 81.  Flexible sigmoidoscopy or colonoscopy. You may have a  sigmoidoscopy every 5 years or a colonoscopy every 10 years starting at age 81.  Hepatitis C blood test.  Hepatitis B blood test.  Sexually transmitted disease (STD) testing.  Diabetes screening. This is done by checking your blood sugar (glucose) after you have not eaten for a while (fasting). You may have this done every 1-3 years.  Bone density scan. This is done to screen for osteoporosis. You may have this done starting at age 81.  Mammogram. This may be done every 1-2 years. Talk to your health care provider about how often you should have regular mammograms.  Talk with your health care provider about your test results, treatment options, and if necessary, the need for more tests. Vaccines Your health care provider may recommend certain vaccines, such as:  Influenza vaccine. This is recommended every year.  Tetanus, diphtheria, and acellular pertussis (Tdap, Td) vaccine. You may need a Td booster every 10 years.  Varicella vaccine. You may need this if you have not been vaccinated.  Zoster vaccine. You may need this after age 81.  Measles, mumps, and rubella (MMR) vaccine. You may need at least one dose of MMR if you were born in 1957 or later. You may also need a second dose.  Pneumococcal 13-valent conjugate (PCV13) vaccine. One dose is recommended after age 81.  Pneumococcal polysaccharide (PPSV23) vaccine. One dose is recommended after age 70.  Meningococcal vaccine. You may need this if you have certain conditions.  Hepatitis A vaccine. You may need this if you have certain conditions or if you travel or work in places where you  may be exposed to hepatitis A.  Hepatitis B vaccine. You may need this if you have certain conditions or if you travel or work in places where you may be exposed to hepatitis B.  Haemophilus influenzae type b (Hib) vaccine. You may need this if you have certain conditions.  Talk to your health care provider about which screenings and  vaccines you need and how often you need them. This information is not intended to replace advice given to you by your health care provider. Make sure you discuss any questions you have with your health care provider. Document Released: 02/04/2015 Document Revised: 09/28/2015 Document Reviewed: 11/09/2014 Elsevier Interactive Patient Education  2017 [employer].

## 2016-10-26 NOTE — Assessment & Plan Note (Signed)
Well controlled, no changes to meds. Encouraged heart healthy diet such as the DASH diet and exercise as tolerated.  °

## 2016-10-26 NOTE — Assessment & Plan Note (Signed)
hgba1c acceptable, minimize simple carbs. Increase exercise as tolerated. Continue current meds Per endo 

## 2016-10-27 ENCOUNTER — Other Ambulatory Visit: Payer: Self-pay | Admitting: Family Medicine

## 2016-10-28 LAB — URINE CULTURE
MICRO NUMBER:: 81110232
SPECIMEN QUALITY:: ADEQUATE

## 2016-10-30 DIAGNOSIS — C44629 Squamous cell carcinoma of skin of left upper limb, including shoulder: Secondary | ICD-10-CM | POA: Diagnosis not present

## 2016-10-30 DIAGNOSIS — L570 Actinic keratosis: Secondary | ICD-10-CM | POA: Diagnosis not present

## 2016-10-31 ENCOUNTER — Other Ambulatory Visit: Payer: Self-pay | Admitting: Family Medicine

## 2016-10-31 DIAGNOSIS — R799 Abnormal finding of blood chemistry, unspecified: Secondary | ICD-10-CM

## 2016-10-31 DIAGNOSIS — R7989 Other specified abnormal findings of blood chemistry: Secondary | ICD-10-CM

## 2016-11-06 ENCOUNTER — Encounter: Payer: Self-pay | Admitting: Family Medicine

## 2016-11-06 ENCOUNTER — Ambulatory Visit (INDEPENDENT_AMBULATORY_CARE_PROVIDER_SITE_OTHER): Payer: PPO | Admitting: Family Medicine

## 2016-11-06 VITALS — BP 120/78 | HR 85

## 2016-11-06 DIAGNOSIS — T148XXA Other injury of unspecified body region, initial encounter: Secondary | ICD-10-CM

## 2016-11-06 DIAGNOSIS — Z23 Encounter for immunization: Secondary | ICD-10-CM

## 2016-11-06 DIAGNOSIS — S81001A Unspecified open wound, right knee, initial encounter: Secondary | ICD-10-CM | POA: Diagnosis not present

## 2016-11-06 DIAGNOSIS — L089 Local infection of the skin and subcutaneous tissue, unspecified: Secondary | ICD-10-CM

## 2016-11-06 MED ORDER — CEPHALEXIN 500 MG PO CAPS: 500.0000 mg | ORAL_CAPSULE | Freq: Two times a day (BID) | ORAL | 0 refills | Status: DC

## 2016-11-06 NOTE — Progress Notes (Signed)
Patient ID: Jessica Dennis, female    DOB: 30-Jul-1932  Age: 83 y.o. MRN: [id number]    Subjective:  Subjective  HPI Jessica Dennis presents after fall at church.  She was carrying flowers out and tripped over flag stone and fell.  She is ok but just wants [place] to look at her wounds.  No LOC,  No head injury except + bruise on upper lip ,  2 abrasions on R knee  Review of Systems  Constitutional: Negative for activity change, appetite change, fatigue and unexpected weight change.  Respiratory: Negative for cough and shortness of breath.   Cardiovascular: Negative for chest pain and palpitations.  Skin: Positive for wound.  Neurological: Negative for dizziness, facial asymmetry, weakness, light-headedness and headaches.  Psychiatric/Behavioral: Negative for behavioral problems, confusion, decreased concentration and dysphoric mood. The patient is not nervous/anxious.     History Past Medical History:  Diagnosis Date  . Anemia   . Angiodysplasia of ascending colon 10/25/2014  . Anxiety   . Arthritis   . Borderline diabetes   . Cancer of the skin, basal cell 09/03/2012  . Cirrhosis ([place])   . Diabetes mellitus without complication ([place])   . Diverticular disease   . GERD (gastroesophageal reflux disease)   . Hypertension   . Iron deficiency anemia due to chronic blood loss 01/19/2015  . Portal hypertensive gastropathy ([place]) 08/02/2016   ? Some GAVE also  . Thyroid disease     She has a past surgical history that includes Abdominal hysterectomy; Total hip arthroplasty (Bilateral, 1993, 2006); Appendectomy; Tonsillectomy; Colonoscopy; Leg skin lesion  biopsy / excision (12/11/14); Mohs surgery; Esophagogastroduodenoscopy; IR Paracentesis (05/25/2016); and Upper gastrointestinal endoscopy.   Her family history includes Bladder Cancer in her father; Diabetes in her mother; Heart attack in her father.She reports that she has never smoked. She has never used smokeless tobacco. She reports that  she does not drink alcohol or use drugs.  Current Outpatient Prescriptions on File Prior to Visit  Medication Sig Dispense Refill  . ACCU-CHEK FASTCLIX LANCETS MISC CHECK BLOOD SUGAR ONCE DAILY. 102 each 12  . Ascorbic Acid (VITAMIN C) 1000 MG tablet Take 1,000 mg by mouth daily.    . Calcium Carbonate-Vitamin D 600-200 MG-UNIT CAPS Take by mouth.    . Flaxseed, Linseed, (FLAX SEED OIL) 1000 MG CAPS Take by mouth.    . furosemide (LASIX) 40 MG tablet Please take 40 mg by mouth daily In the am 30 tablet 5  . gabapentin (NEURONTIN) 100 MG capsule 1 po qhs prn for 3 days then 2 po qhs for 3-4 days then 3 can increase to 3 a day if needed for pain 90 capsule 2  . latanoprost (XALATAN) 0.005 % ophthalmic solution Place 1 drop into both eyes at bedtime.     Marland Kitchen levothyroxine (SYNTHROID, LEVOTHROID) 75 MCG tablet TAKE ONE TABLET BY MOUTH DAILY 90 tablet 2  . LORazepam (ATIVAN) 0.5 MG tablet Take 1 tablet (0.5 mg total) by mouth daily as needed. 90 tablet 2  . Melatonin 5 MG CAPS Take 5 mg by mouth.    . meloxicam (MOBIC) 7.5 MG tablet Take 1 tablet (7.5 mg total) by mouth 2 (two) times daily. 180 tablet 1  . NOVOLOG MIX 70/30 FLEXPEN (70-30) 100 UNIT/ML FlexPen 60 Units daily. 35 in the morning and 25 at HS    . Omega-3 Fatty Acids (FISH OIL) 1000 MG CAPS Take by mouth every morning.    . ONE TOUCH ULTRA TEST test strip  CHECK BLOOD SUGAR ONCE DAILY. 50 each 12  . ONETOUCH DELICA LANCETS FINE MISC Check blood sugar once daily. Dx:E11.9 100 each 12  . ONETOUCH VERIO test strip Check blood sugar once daily. Dx:E11.9 100 each 12  . pantoprazole (PROTONIX) 40 MG tablet Take 1 tablet by mouth  daily 90 tablet 1  . potassium chloride SA (KLOR-CON M20) 20 MEQ tablet Take 1 tablet (20 mEq total) by mouth daily. 90 tablet 1  . Prednicarbate 0.1 % CREA Apply topically 2 (two) times daily.     . simvastatin (ZOCOR) 20 MG tablet Take 1 tablet (20 mg total) by mouth at bedtime. 90 tablet 1  . spironolactone  (ALDACTONE) 50 MG tablet Take 2 tablets (100 mg total) by mouth daily. 60 tablet 5  . vitamin E 100 UNIT capsule Take by mouth daily.     Current Facility-Administered Medications on File Prior to Visit  Medication Dose Route Frequency Provider Last Rate Last Dose  . 0.9 %  sodium chloride infusion  500 mL Intravenous Continuous Gatha Mayer, MD         Objective:  Objective  Physical Exam  Constitutional: She is oriented to person, place, and time. She appears well-developed and well-nourished.  HENT:  Head: Normocephalic and atraumatic.    Eyes: Conjunctivae and EOM are normal.  Neck: Normal range of motion. Neck supple. No JVD present. Carotid bruit is not present. No thyromegaly present.  Cardiovascular: Normal rate, regular rhythm and normal heart sounds.   No murmur heard. Pulmonary/Chest: Effort normal and breath sounds normal. No respiratory distress. She has no wheezes. She has no rales. She exhibits no tenderness.  Musculoskeletal: She exhibits no edema.  Neurological: She is alert and oriented to person, place, and time.  Skin: There is erythema.     + skin avulsion-- pt already cut off dead skin  Area cleaned and abx ointment and bandage put in place   Psychiatric: She has a normal mood and affect.  Nursing note and vitals reviewed.  BP 120/78   Pulse 85   SpO2 97%  Wt Readings from Last 3 Encounters:  10/26/16 158 lb 9.6 oz (71.9 kg)  10/09/16 153 lb (69.4 kg)  09/27/16 151 lb 6.4 oz (68.7 kg)     Lab Results  Component Value Date   WBC 8.8 10/26/2016   HGB 10.6 (L) 10/26/2016   HCT 31.9 (L) 10/26/2016   PLT 63.0 (L) 10/26/2016   GLUCOSE 166 (H) 10/26/2016   CHOL 132 10/26/2016   TRIG 55.0 10/26/2016   HDL 70.90 10/26/2016   LDLCALC 51 10/26/2016   ALT 34 10/26/2016   AST 32 10/26/2016   NA 137 10/26/2016   K 4.3 10/26/2016   CL 100 10/26/2016   CREATININE 1.22 (H) 10/26/2016   BUN 39 (H) 10/26/2016   CO2 30 10/26/2016   TSH 2.03 10/26/2016     INR 1.4 (H) 05/24/2016   HGBA1C 6.2 10/26/2016   MICROALBUR <0.7 10/26/2016    Dg Shoulder Right  Result Date: 09/27/2016 CLINICAL DATA:  Right shoulder pain for several months EXAM: RIGHT SHOULDER - 2+ VIEW COMPARISON:  None. FINDINGS: No fracture or dislocation. Mild inferior glenohumeral degenerative change. The right lung apex is clear IMPRESSION: Mild degenerative changes.  No acute osseous abnormality. Electronically Signed   By: Donavan Foil M.D.   On: 09/27/2016 16:56     Assessment & Plan:  Plan  I am having Ms. Korpela start on cephALEXin. I am also having  her maintain her Prednicarbate, latanoprost, Fish Oil, vitamin C, ONETOUCH VERIO, ONETOUCH DELICA LANCETS FINE, Calcium Carbonate-Vitamin D, Melatonin, simvastatin, pantoprazole, meloxicam, potassium chloride SA, furosemide, spironolactone, LORazepam, ONE TOUCH ULTRA TEST, ACCU-CHEK FASTCLIX LANCETS, NOVOLOG MIX 70/30 FLEXPEN, Flax Seed Oil, vitamin E, gabapentin, and levothyroxine. We will continue to administer sodium chloride.  Meds ordered this encounter  Medications  . cephALEXin (KEFLEX) 500 MG capsule    Sig: Take 1 capsule (500 mg total) by mouth 2 (two) times daily.    Dispense:  20 capsule    Refill:  0    Problem List Items Addressed This Visit    None    Visit Diagnoses    Wound infection    -  Primary   Relevant Medications   cephALEXin (KEFLEX) 500 MG capsule   Open wound of right knee, initial encounter       Need for Tdap vaccination       Relevant Orders   Tdap vaccine greater than or equal to 7yo IM (Completed)    abx ointment and bandage--- change dressing daily Keflex for 10 days Tetanus given F/u prn   Follow-up: Return if symptoms worsen or fail to improve.  Ann Held, DO

## 2016-11-06 NOTE — Progress Notes (Signed)
Pre visit review using our clinic tool,if applicable. No additional management support is needed unless otherwise documented below in the visit note.  

## 2016-11-06 NOTE — Patient Instructions (Signed)
How to Change Your Dressing A dressing is a material that is placed in and over wounds. A dressing helps your wound to heal by protecting it from:  Bacteria.  Worse injury.  Being too dry or too wet.  What are the risks? The sticky (adhesive) tape that is used with a dressing may make your skin sore or irritated, or it may cause a rash. These are the most common problems. However, more serious problems can develop, such as:  Bleeding.  Infection.  How to change your dressing Getting Ready to Change Your Dressing   Take a shower before you do the first dressing change of the day. If your doctor does not want your wound to get wet and your dressing is not waterproof, you may need to put plastic leak-proof sealing wrap on your dressing to protect it.  If needed, take pain medicine as told by your doctor 30 minutes before you change your dressing.  Set up a clean station for wound care. You will need: ? A plastic trash bag that is open and ready to use. ? Hand sanitizer. ? Wound cleanser or salt-water solution (saline) as told by your doctor. ? New dressing material or bandages. Make sure to open the dressing package so the dressing stays on the inside of the package. You may also need these supplies in your clean station:  A box of vinyl gloves.  Tape.  Skin protectant. This may be a wipe, film, or spray.  Clean or germ-free (sterile) scissors.  A cotton-tipped applicator.  Taking Off Your Old Dressing  Wash your hands with soap and water. Dry your hands with a clean towel. If you cannot use soap and water, use hand sanitizer.  If you are using gloves, put on the gloves before you take off the dressing.  Gently take off any adhesive or tape by pulling it off in the direction of your hair growth. Only touch the outside edges of the dressing.  Take off the dressing. If the dressing sticks to your skin, wet the dressing with a germ-free salt-water solution. This helps it  come off more easily.  Take off any gauze or packing in your wound.  Throw the old dressing supplies into the ready trash bag.  Take off your gloves. To take off each glove, grab the cuff with your other hand and turn the glove inside out. Put the gloves in the trash right away.  Wash your hands with soap and water. Dry your hands with a clean towel. If you cannot use soap and water, use hand sanitizer. Cleaning Your Wound  Follow instructions from your doctor about how to clean your wound. This may include using a salt-water solution or recommended wound cleanser.  Do not use over-the-counter medicated or antiseptic creams, sprays, liquids, or dressings unless your doctor tells you to do that.  Use a clean gauze pad to clean the area fully with the salt-water solution or wound cleanser that your doctor recommends.  Throw the gauze pad into the trash bag.  Wash your hands with soap and water. Dry your hands with a clean towel. If you cannot use soap and water, use hand sanitizer. Putting on the Dressing  If your doctor recommended a skin protectant, put it on the skin around the wound.  Cover the wound with the recommended dressing, such as a nonstick gauze or bandage. Make sure to touch only the outside edges of the dressing. Do not touch the inside of the dressing.    Attach the dressing so all sides stay in place. You may do this with the attached medical adhesive, roll gauze, or tape. If you use tape, do not wrap the tape all the way around your arm or leg.  Take off your gloves. Put them in the trash bag with the old dressing. Tie the bag shut and throw it away.  Wash your hands with soap and water. Dry your hands with a clean towel. If you cannot use soap and water, use hand sanitizer. Get help if:   You have new pain.  You have irritation, a rash, or itching around the wound or dressing.  Changing your dressing is painful.  Changing your dressing causes a lot of  bleeding. Get help right away if:  You have very bad pain.  You have signs of infection, such as: ? More redness, swelling, or pain. ? More fluid or blood. ? Warmth. ? Pus or a bad smell. ? Red streaks leading from wound. ? A fever. This information is not intended to replace advice given to you by your health care provider. Make sure you discuss any questions you have with your health care provider. Document Released: 04/06/2008 Document Revised: 06/16/2015 Document Reviewed: 10/14/2014 Elsevier Interactive Patient Education  2018 Elsevier Inc.  

## 2016-11-13 ENCOUNTER — Other Ambulatory Visit: Payer: Self-pay | Admitting: Internal Medicine

## 2016-11-13 NOTE — Telephone Encounter (Signed)
How many refills Sir, thank you. 

## 2016-11-14 ENCOUNTER — Other Ambulatory Visit (INDEPENDENT_AMBULATORY_CARE_PROVIDER_SITE_OTHER): Payer: PPO

## 2016-11-14 DIAGNOSIS — R7989 Other specified abnormal findings of blood chemistry: Secondary | ICD-10-CM

## 2016-11-14 DIAGNOSIS — R799 Abnormal finding of blood chemistry, unspecified: Secondary | ICD-10-CM | POA: Diagnosis not present

## 2016-11-14 LAB — BASIC METABOLIC PANEL
BUN: 28 mg/dL — ABNORMAL HIGH (ref 6–23)
CO2: 26 mEq/L (ref 19–32)
Calcium: 9.3 mg/dL (ref 8.4–10.5)
Chloride: 104 mEq/L (ref 96–112)
Creatinine, Ser: 1.03 mg/dL (ref 0.40–1.20)
GFR: 54.54 mL/min — ABNORMAL LOW (ref >60.00)
Glucose, Bld: 157 mg/dL — ABNORMAL HIGH (ref 70–99)
Potassium: 4.8 mEq/L (ref 3.5–5.1)
Sodium: 137 mEq/L (ref 135–145)

## 2016-11-14 NOTE — Addendum Note (Signed)
Addended by: Harl Bowie on: 11/14/2016 10:46 AM   Modules accepted: Orders

## 2016-11-14 NOTE — Telephone Encounter (Signed)
3 refills spironolactone

## 2016-11-17 ENCOUNTER — Other Ambulatory Visit: Payer: Self-pay | Admitting: Family Medicine

## 2016-11-20 ENCOUNTER — Ambulatory Visit (HOSPITAL_COMMUNITY)
Admission: RE | Admit: 2016-11-20 | Discharge: 2016-11-20 | Disposition: A | Discharge status: Home / Self Care | Payer: PPO | Source: Ambulatory Visit | Attending: Hematology & Oncology | Admitting: Hematology & Oncology

## 2016-11-20 ENCOUNTER — Other Ambulatory Visit (HOSPITAL_BASED_OUTPATIENT_CLINIC_OR_DEPARTMENT_OTHER): Payer: PPO

## 2016-11-20 ENCOUNTER — Other Ambulatory Visit: Payer: Self-pay | Admitting: Family

## 2016-11-20 ENCOUNTER — Ambulatory Visit (HOSPITAL_BASED_OUTPATIENT_CLINIC_OR_DEPARTMENT_OTHER): Payer: PPO

## 2016-11-20 ENCOUNTER — Ambulatory Visit (HOSPITAL_BASED_OUTPATIENT_CLINIC_OR_DEPARTMENT_OTHER): Payer: PPO | Admitting: Hematology & Oncology

## 2016-11-20 VITALS — BP 128/54 | HR 88 | Temp 97.8°F | Resp 19 | Wt 168.0 lb

## 2016-11-20 DIAGNOSIS — R195 Other fecal abnormalities: Secondary | ICD-10-CM | POA: Insufficient documentation

## 2016-11-20 DIAGNOSIS — E119 Type 2 diabetes mellitus without complications: Secondary | ICD-10-CM | POA: Diagnosis not present

## 2016-11-20 DIAGNOSIS — D500 Iron deficiency anemia secondary to blood loss (chronic): Secondary | ICD-10-CM

## 2016-11-20 DIAGNOSIS — R739 Hyperglycemia, unspecified: Secondary | ICD-10-CM

## 2016-11-20 DIAGNOSIS — IMO0002 Reserved for concepts with insufficient information to code with codable children: Secondary | ICD-10-CM

## 2016-11-20 DIAGNOSIS — E1151 Type 2 diabetes mellitus with diabetic peripheral angiopathy without gangrene: Secondary | ICD-10-CM | POA: Insufficient documentation

## 2016-11-20 DIAGNOSIS — N920 Excessive and frequent menstruation with regular cycle: Secondary | ICD-10-CM | POA: Diagnosis not present

## 2016-11-20 DIAGNOSIS — K7460 Unspecified cirrhosis of liver: Secondary | ICD-10-CM

## 2016-11-20 DIAGNOSIS — D6959 Other secondary thrombocytopenia: Secondary | ICD-10-CM

## 2016-11-20 DIAGNOSIS — E1165 Type 2 diabetes mellitus with hyperglycemia: Secondary | ICD-10-CM | POA: Insufficient documentation

## 2016-11-20 DIAGNOSIS — C4491 Basal cell carcinoma of skin, unspecified: Secondary | ICD-10-CM

## 2016-11-20 LAB — CBC WITH DIFFERENTIAL (CANCER CENTER ONLY)
BASO#: 0 10*3/uL (ref 0.0–0.2)
BASO%: 0.5 % (ref 0.0–2.0)
EOS%: 4 % (ref 0.0–7.0)
Eosinophils Absolute: 0.2 10*3/uL (ref 0.0–0.5)
HCT: 26.4 % — ABNORMAL LOW (ref 34.8–46.6)
HGB: 8.1 g/dL — ABNORMAL LOW (ref 11.6–15.9)
LYMPH#: 0.6 10*3/uL — ABNORMAL LOW (ref 0.9–3.3)
LYMPH%: 10.8 % — ABNORMAL LOW (ref 14.0–48.0)
MCH: 26.2 pg (ref 26.0–34.0)
MCHC: 30.7 g/dL — ABNORMAL LOW (ref 32.0–36.0)
MCV: 85 fL (ref 81–101)
MONO#: 0.4 10*3/uL (ref 0.1–0.9)
MONO%: 6.8 % (ref 0.0–13.0)
NEUT#: 4.3 10*3/uL (ref 1.5–6.5)
NEUT%: 77.9 % (ref 39.6–80.0)
Platelets: 73 10*3/uL — ABNORMAL LOW (ref 145–400)
RBC: 3.09 10*6/uL — ABNORMAL LOW (ref 3.70–5.32)
RDW: 14.9 % (ref 11.1–15.7)
WBC: 5.5 10*3/uL (ref 3.9–10.0)

## 2016-11-20 LAB — CMP (CANCER CENTER ONLY)
ALT(SGPT): 30 U/L (ref 10–47)
AST: 33 U/L (ref 11–38)
Albumin: 3.1 g/dL — ABNORMAL LOW (ref 3.3–5.5)
Alkaline Phosphatase: 89 U/L — ABNORMAL HIGH (ref 26–84)
BUN, Bld: 22 mg/dL (ref 7–22)
CO2: 27 mEq/L (ref 18–33)
Calcium: 9.4 mg/dL (ref 8.0–10.3)
Chloride: 103 mEq/L (ref 98–108)
Creat: 1.1 mg/dl (ref 0.6–1.2)
Glucose, Bld: 231 mg/dL — ABNORMAL HIGH (ref 73–118)
Potassium: 4.2 mEq/L (ref 3.3–4.7)
Sodium: 140 mEq/L (ref 128–145)
Total Bilirubin: 1.1 mg/dl (ref 0.20–1.60)
Total Protein: 5.7 g/dL — ABNORMAL LOW (ref 6.4–8.1)

## 2016-11-20 LAB — PROTIME-INR (CHCC SATELLITE)
INR: 1.1 — ABNORMAL LOW (ref 2.0–3.5)
Protime: 13.2 Seconds (ref 10.6–13.4)

## 2016-11-20 LAB — ABO/RH: ABO/RH(D): A NEG

## 2016-11-20 LAB — PREPARE RBC (CROSSMATCH)

## 2016-11-20 MED ORDER — INSULIN REGULAR HUMAN 100 UNIT/ML IJ SOLN
10.0000 [IU] | Freq: Once | INTRAMUSCULAR | Status: AC
  Administered 2016-11-20: 10 [IU] via SUBCUTANEOUS

## 2016-11-20 NOTE — Patient Instructions (Signed)
Regular Insulin injection What is this medicine? REGULAR INSULIN (REG yuh ler IN su lin) is a human-made form of insulin. This medicine lowers the amount of sugar in your blood. It is a short-acting insulin that starts working about 30 minutes after it is injected. This medicine may be used for other purposes; ask your health care provider or pharmacist if you have questions. COMMON BRAND NAME(S): Humulin R, Novolin R, ReliOn, Velosulin BR What should I tell my health care provider before I take this medicine? They need to know if you have any of these conditions: -episodes of hypoglycemia -kidney disease -liver disease -an unusual or allergic reaction to insulin, metacresol, other medicines, foods, dyes, or preservatives -pregnant or trying to get pregnant -breast-feeding How should I use this medicine? This medicine is for injection under the skin. Use exactly as directed. It is important to follow the directions given to you by your doctor or health care professional. Your doctor or health care professional will tell you how long to wait after you inject your dose before eating a meal. Most of the time, you should eat a meal within 30 minutes of your injection. You will be taught how to use this medicine and how to adjust doses for activities and illness. Do not use more insulin than prescribed. Do not use more or less often than prescribed. If you use U-500 insulin: Make sure you are using the right insulin vial prior to each use. You should only use a U-500 insulin syringe. Do not use a U-100 insulin syringe or a tuberculin syringe. The markings on each syringe are different. If you do not use the right syringe, you may take the wrong dose. This can lead to serious side effects. Always check the appearance of your insulin before using it. This medicine should be clear and colorless like water. Do not use it if it is cloudy, thickened, colored, or has solid particles in it. It is important that  you put your used needles and syringes in a special sharps container. Do not put them in a trash can. If you do not have a sharps container, call your pharmacist or healthcare provider to get one. Talk to your pediatrician regarding the use of this medicine in children. While this medicine may be prescribed for children for selected conditions, precautions do apply. Overdosage: If you think you have taken too much of this medicine contact a poison control center or emergency room at once. NOTE: This medicine is only for you. Do not share this medicine with others. What if I miss a dose? It is important not to miss a dose. Your health care professional or doctor should discuss a plan for missed doses with you. If you do miss a dose, follow their plan. Do not take double doses. What may interact with this medicine? -other medicines for diabetes Many medications may cause an increase or decrease in blood sugar, these include: -alcohol containing beverages -aspirin and aspirin-like drugs -chloramphenicol -chromium -diuretics -female hormones, like estrogens or progestins and birth control pills -heart medicines -isoniazid -female hormones or anabolic steroids -medicines for weight loss -medicines for allergies, asthma, cold, or cough -medicines for mental problems -medicines called MAO Inhibitors like Nardil, Parnate, Marplan, Eldepryl -niacin -NSAIDs, medicines for pain and inflammation, like ibuprofen or naproxen -pentamidine -phenytoin -probenecid -quinolone antibiotics like ciprofloxacin, levofloxacin, ofloxacin -some herbal dietary supplements -steroid medicines like prednisone or cortisone -thyroid medicine Some medications can hide the warning symptoms of low blood sugar. You may  need to monitor your blood sugar more closely if you are taking one of these medications. These include: -beta-blockers such as atenolol, metoprolol, propranolol -clonidine -guanethidine -reserpine This  list may not describe all possible interactions. Give your health care provider a list of all the medicines, herbs, non-prescription drugs, or dietary supplements you use. Also tell them if you smoke, drink alcohol, or use illegal drugs. Some items may interact with your medicine. What should I watch for while using this medicine? Visit your health care professional or doctor for regular checks on your progress. A test called the HbA1C (A1C) will be monitored. This is a simple blood test. It measures your blood sugar control over the last 2 to 3 months. You will receive this test every 3 to 6 months. Learn how to check your blood sugar. Learn the symptoms of low and high blood sugar and how to manage them. Always carry a quick-source of sugar with you in case you have symptoms of low blood sugar. Examples include hard sugar candy or glucose tablets. Make sure others know that you can choke if you eat or drink when you develop serious symptoms of low blood sugar, such as seizures or unconsciousness. They must get medical help at once. Tell your doctor or health care professional if you have high blood sugar. You might need to change the dose of your medicine. If you are sick or exercising more than usual, you might need to change the dose of your medicine. Do not skip meals. Ask your doctor or health care professional if you should avoid alcohol. Many nonprescription cough and cold products contain sugar or alcohol. These can affect blood sugar. Make sure that you have the right kind of syringe for the type of insulin you use. Try not to change the brand and type of insulin or syringe unless your health care professional or doctor tells you to. Switching insulin brand or type can cause dangerously high or low blood sugar. Always keep an extra supply of insulin, syringes, and needles on hand. Use a syringe one time only. Throw away syringe and needle in a closed container to prevent accidental needle  sticks. Insulin pens and cartridges should never be shared. Even if the needle is changed, sharing may result in passing of viruses like hepatitis or HIV. Wear a medical ID bracelet or chain, and carry a card that describes your disease and details of your medicine and dosage times. What side effects may I notice from receiving this medicine? Side effects that you should report to your doctor or health care professional as soon as possible: -allergic reactions like skin rash, itching or hives, swelling of the face, lips, or tongue -breathing problems -signs and symptoms of high blood sugar such as dizziness, dry mouth, dry skin, fruity breath, nausea, stomach pain, increased hunger or thirst, increased urination -signs and symptoms of low blood sugar such as feeling anxious, confusion, dizziness, increased hunger, unusually weak or tired, sweating, shakiness, cold, irritable, headache, blurred vision, fast heartbeat, loss of consciousness Side effects that usually do not require medical attention (report to your doctor or health care professional if they continue or are bothersome): -increase or decrease in fatty tissue under the skin due to overuse of a particular injection site -itching, burning, swelling, or rash at site where injected This list may not describe all possible side effects. Call your doctor for medical advice about side effects. You may report side effects to FDA at 1-800-FDA-1088. Where should I keep  my medicine? Keep out of the reach of children. Store unopened insulin vials in a refrigerator between 2 and 8 degrees C (36 and 46 degrees F). Do not freeze or use if the insulin has been frozen. If unopened and in the refrigerator, your insulin can be used until the expiration date printed on the vial. Opened vials that are in use may be kept at room temperature to decrease the amount of pain during injection. Novolin R vials (open vials currently in use): Store at room temperature,  at approximately 25 degrees C (77 degrees F) or cooler. Do not refrigerate or freeze. Throw away after 42 days. Humulin R U-100 vials (open vials currently in use): Store at room temperature, at approximately 30 degrees C (86 degrees F) or cooler. May store in the refrigerator. Do not freeze. Throw away after 31 days. Humulin R U-500 (open vials currently in use): Store at room temperature, below 30 degrees C (86 degrees F) or cooler. May store in the refrigerator. Do not freeze. Throw away after 40 days. Insulin Pens: Store unopened cartridges or disposable pens in a refrigerator between 2 and 8 degrees C (36 and 46 degrees F). Do not freeze or use if the insulin has been frozen. If stored at room temperature below 30 degrees C (86 degrees F), the insulin must be thrown away after 28 days. Once opened, store the disposable pens and cartridges that are inserted to pens at room temperature, approximately 30 degrees C (86 degrees F) or cooler. Do not store in the refrigerator. Once opened, the insulin can be used for 28 days. After 28 days, the cartridge or disposable pen should be thrown away. Protect from light and excessive heat. Throw away any unused medicine after the expiration date or after the specified time for room temperature storage has passed. NOTE: This sheet is a summary. It may not cover all possible information. If you have questions about this medicine, talk to your doctor, pharmacist, or health care provider.  2018 Elsevier/Gold Standard (2015-02-28 14:31:24)

## 2016-11-21 ENCOUNTER — Telehealth: Payer: Self-pay | Admitting: Family Medicine

## 2016-11-21 ENCOUNTER — Other Ambulatory Visit: Payer: Self-pay

## 2016-11-21 LAB — IRON AND TIBC
%SAT: 7 % — ABNORMAL LOW (ref 21–57)
Iron: 23 ug/dL — ABNORMAL LOW (ref 41–142)
TIBC: 343 ug/dL (ref 236–444)
UIBC: 320 ug/dL (ref 120–384)

## 2016-11-21 LAB — FERRITIN: Ferritin: 9 ng/ml (ref 9–269)

## 2016-11-21 MED ORDER — FUROSEMIDE 40 MG PO TABS: ORAL_TABLET | ORAL | 1 refills | Status: DC

## 2016-11-21 NOTE — Progress Notes (Signed)
Hematology and Oncology Follow Up Visit  Jessica Dennis [id number] [birth year] 83 y.o. 11/21/2016   Principle Diagnosis:  Chronic thrombocytopenia secondary to cirrhosis and splenomegaly Iron deficiency anemia secondary to GI bleed   Current Therapy:   IV iron as indicated-dose to be given on 11/22/2016 Red blood cell transfusion as needed for variceal bleeding    Interim History:  Jessica Dennis is here today for a follow-up. Shock him enough, she really is not looking that great. She just feels tired. She does not have a lot of energy.  She does have cirrhosis. She has had a colonoscopy. What she tells me, the colonoscopy showed some varices.  She has had a decreased appetite. She's had no nausea or vomiting.  She's had some swelling in her legs. This is somewhat new.  When we tested her iron level today, her iron stores were incredibly low. Her ferritin was only 9 with an iron saturation of 7%.  She has not had any cough. Her weight is stable.  He does have diabetes. Her blood sugars have been out of control. Today, her blood sugar was 231. We did go ahead and give her 10 units of insulin.  Currently, her performance status is ECOG 1.  Medications:  Allergies as of 11/20/2016   No Known Allergies     Medication List       Accurate as of 11/20/16 11:59 PM. Always use your most recent med list.          Calcium Carbonate-Vitamin D 600-200 MG-UNIT Caps Take by mouth.   cephALEXin 500 MG capsule Commonly known as:  KEFLEX Take 1 capsule (500 mg total) by mouth 2 (two) times daily.   Fish Oil 1000 MG Caps Take by mouth every morning.   Flax Seed Oil 1000 MG Caps Take by mouth.   furosemide 40 MG tablet Commonly known as:  LASIX Please take 40 mg by mouth daily In the am   gabapentin 100 MG capsule Commonly known as:  NEURONTIN 1 po qhs prn for 3 days then 2 po qhs for 3-4 days then 3 can increase to 3 a day if needed for pain   latanoprost 0.005 % ophthalmic  solution Commonly known as:  XALATAN Place 1 drop into both eyes at bedtime.   levothyroxine 75 MCG tablet Commonly known as:  SYNTHROID, LEVOTHROID TAKE ONE TABLET BY MOUTH DAILY   LORazepam 0.5 MG tablet Commonly known as:  ATIVAN Take 1 tablet (0.5 mg total) by mouth daily as needed.   Melatonin 5 MG Caps Take 5 mg by mouth.   meloxicam 7.5 MG tablet Commonly known as:  MOBIC Take 1 tablet (7.5 mg total) by mouth 2 (two) times daily.   NOVOLOG MIX 70/30 FLEXPEN (70-30) 100 UNIT/ML FlexPen Generic drug:  insulin aspart protamine - aspart 60 Units daily. 35 in the morning and 25 at HS   Bison blood sugar once daily. Dx:E11.9   ACCU-CHEK FASTCLIX LANCETS Misc CHECK BLOOD SUGAR ONCE DAILY.   pantoprazole 40 MG tablet Commonly known as:  PROTONIX Take 1 tablet by mouth  daily   potassium chloride SA 20 MEQ tablet Commonly known as:  KLOR-CON M20 Take 1 tablet (20 mEq total) by mouth daily.   Prednicarbate 0.1 % Crea Apply topically 2 (two) times daily.   simvastatin 20 MG tablet Commonly known as:  ZOCOR Take 1 tablet (20 mg total) by mouth at bedtime.   spironolactone 50 MG tablet Commonly known  as:  ALDACTONE TAKE TWO TABLETS BY MOUTH DAILY   vitamin C 1000 MG tablet Take 1,000 mg by mouth daily.   vitamin E 100 UNIT capsule Take by mouth daily.       Allergies: No Known Allergies  Past Medical History, Surgical history, Social history, and Family History were reviewed and updated.  Review of Systems: As stated in the interim history   Physical Exam:  weight is 168 lb (76.2 kg). Her oral temperature is 97.8 F (36.6 C). Her blood pressure is 128/54 (abnormal) and her pulse is 88. Her respiration is 19 and oxygen saturation is 97%.   Wt Readings from Last 3 Encounters:  11/20/16 168 lb (76.2 kg)  10/26/16 158 lb 9.6 oz (71.9 kg)  10/09/16 153 lb (69.4 kg)    Well-developed and well-nourished white female. Head  and neck exam shows no scleral icterus. Her conjunctiva are pale. She has no intraoral lesions. Her thyroid is nonpalpable. Her adenopathy in the neck is not palpable. Lungs are clear bilaterally. Cardiac exam regular rate and rhythm with no murmurs, rubs or bruits. Abdomen is soft. She has good bowel sounds. There is no fluid wave. Her spleen tip might be palpable at the left costal margin. There is no palpable hepatomegaly. Rectal exam shows some small external hemorrhoids. There is no mass in the rectal vault. Her stool is somewhat dark and heme positive. Extremities shows 2+ edema in her legs. Neurological exam shows no focal neurological deficits.  Lab Results  Component Value Date   WBC 5.5 11/20/2016   HGB 8.1 (L) 11/20/2016   HCT 26.4 (L) 11/20/2016   MCV 85 11/20/2016   PLT 73 (L) 11/20/2016   Lab Results  Component Value Date   FERRITIN 9 11/20/2016   IRON 23 (L) 11/20/2016   TIBC 343 11/20/2016   UIBC 320 11/20/2016   IRONPCTSAT 7 (L) 11/20/2016   Lab Results  Component Value Date   RBC 3.09 (L) 11/20/2016   No results found for: KPAFRELGTCHN, LAMBDASER, KAPLAMBRATIO No results found for: IGGSERUM, IGA, IGMSERUM No results found for: Odetta Pink, SPEI   Chemistry      Component Value Date/Time   NA 140 11/20/2016 1143   NA 139 03/05/2016 1139   K 4.2 11/20/2016 1143   K 3.6 03/05/2016 1139   CL 103 11/20/2016 1143   CO2 27 11/20/2016 1143   CO2 26 03/05/2016 1139   BUN 22 11/20/2016 1143   BUN 14.3 03/05/2016 1139   CREATININE 1.1 11/20/2016 1143   CREATININE 0.7 03/05/2016 1139      Component Value Date/Time   CALCIUM 9.4 11/20/2016 1143   CALCIUM 9.3 03/05/2016 1139   ALKPHOS 89 (H) 11/20/2016 1143   ALKPHOS 127 03/05/2016 1139   AST 33 11/20/2016 1143   AST 29 03/05/2016 1139   ALT 30 11/20/2016 1143   ALT 22 03/05/2016 1139   BILITOT 1.10 11/20/2016 1143   BILITOT 1.75 (H) 03/05/2016 1139      Impression and Plan: Jessica Dennis is a very pleasant 83 yo white female with chronic thrombocytopenia.  I'm surprised that she looks somewhat different. She definitely needs to be transfused. She does have cirrhosis and I suspect some variceal bleeding.  I talked to her for about 45 minutes. I had to spend this much time with her because I was not expecting her to have these difficulties.  I explained to her why I thought she needed a blood  transfusion. I want her to feel well for the Thanksgiving holiday. She just has no energy. She really would have a poor quality of life over the Thanksgiving holiday and that is just not what we want.   She is agreeable to the blood transfusion. I told her how it was done. I told her that the transfusion is safe. We test the blood for hepatitis and HIV many many times and the risk of trans-fusion in infections are less than 1:20,000 units.  We will also give her iron. She'll need one dose of iron tomorrow and then 1 dose next week.  I answered all of her questions. She is always so nice. Again I just want her feeling well.  We will see her back in about 3 or 4 weeks. Hopefully, she will be feeling better and we'll find that her blood count is back up to normal.  Volanda Napoleon, MD 10/31/20185:16 PM

## 2016-11-21 NOTE — Telephone Encounter (Signed)
[facility] on [address] called. Need new FUROSAMIDE script. We denied request and told them it was already done but they have not gotten anything from here. Last written 05/2016 with 5 refills so it is time for a new script. also requesting 90 days. CB: [phone].

## 2016-11-22 ENCOUNTER — Ambulatory Visit (HOSPITAL_BASED_OUTPATIENT_CLINIC_OR_DEPARTMENT_OTHER): Payer: PPO

## 2016-11-22 ENCOUNTER — Ambulatory Visit (HOSPITAL_COMMUNITY)
Admission: RE | Admit: 2016-11-22 | Discharge: 2016-11-22 | Disposition: A | Discharge status: Home / Self Care | Payer: PPO | Source: Ambulatory Visit | Attending: Hematology & Oncology | Admitting: Hematology & Oncology

## 2016-11-22 VITALS — BP 118/53 | HR 73 | Temp 97.9°F | Resp 18

## 2016-11-22 DIAGNOSIS — K922 Gastrointestinal hemorrhage, unspecified: Secondary | ICD-10-CM | POA: Diagnosis not present

## 2016-11-22 DIAGNOSIS — E1151 Type 2 diabetes mellitus with diabetic peripheral angiopathy without gangrene: Secondary | ICD-10-CM

## 2016-11-22 DIAGNOSIS — E1165 Type 2 diabetes mellitus with hyperglycemia: Secondary | ICD-10-CM | POA: Insufficient documentation

## 2016-11-22 DIAGNOSIS — I864 Gastric varices: Secondary | ICD-10-CM

## 2016-11-22 DIAGNOSIS — D500 Iron deficiency anemia secondary to blood loss (chronic): Secondary | ICD-10-CM

## 2016-11-22 DIAGNOSIS — N920 Excessive and frequent menstruation with regular cycle: Secondary | ICD-10-CM

## 2016-11-22 DIAGNOSIS — R195 Other fecal abnormalities: Secondary | ICD-10-CM

## 2016-11-22 DIAGNOSIS — R739 Hyperglycemia, unspecified: Secondary | ICD-10-CM

## 2016-11-22 DIAGNOSIS — IMO0002 Reserved for concepts with insufficient information to code with codable children: Secondary | ICD-10-CM

## 2016-11-22 MED ORDER — FUROSEMIDE 10 MG/ML IJ SOLN
INTRAMUSCULAR | Status: AC
  Filled 2016-11-22: qty 4

## 2016-11-22 MED ORDER — SODIUM CHLORIDE 0.9 % IV SOLN
250.0000 mL | Freq: Once | INTRAVENOUS | Status: AC
  Administered 2016-11-22: 250 mL via INTRAVENOUS

## 2016-11-22 MED ORDER — ACETAMINOPHEN 325 MG PO TABS
650.0000 mg | ORAL_TABLET | Freq: Once | ORAL | Status: AC
  Administered 2016-11-22: 650 mg via ORAL

## 2016-11-22 MED ORDER — SODIUM CHLORIDE 0.9 % IV SOLN
510.0000 mg | Freq: Once | INTRAVENOUS | Status: AC
  Administered 2016-11-22: 510 mg via INTRAVENOUS
  Filled 2016-11-22: qty 17

## 2016-11-22 MED ORDER — DIPHENHYDRAMINE HCL 25 MG PO CAPS
ORAL_CAPSULE | ORAL | Status: AC
  Filled 2016-11-22: qty 1

## 2016-11-22 MED ORDER — ACETAMINOPHEN 325 MG PO TABS
ORAL_TABLET | ORAL | Status: AC
  Filled 2016-11-22: qty 2

## 2016-11-22 MED ORDER — DIPHENHYDRAMINE HCL 25 MG PO CAPS
25.0000 mg | ORAL_CAPSULE | Freq: Once | ORAL | Status: AC
  Administered 2016-11-22: 25 mg via ORAL

## 2016-11-22 MED ORDER — FUROSEMIDE 10 MG/ML IJ SOLN
20.0000 mg | Freq: Once | INTRAMUSCULAR | Status: AC
  Administered 2016-11-22: 20 mg via INTRAVENOUS

## 2016-11-22 NOTE — Patient Instructions (Signed)

## 2016-11-23 LAB — TYPE AND SCREEN
ABO/RH(D): A NEG
Antibody Screen: NEGATIVE
Unit division: 0
Unit division: 0

## 2016-11-23 LAB — BPAM RBC
Blood Product Expiration Date: 201811252359
Blood Product Expiration Date: 201811252359
ISSUE DATE / TIME: 201811010846
ISSUE DATE / TIME: 201811010846
Unit Type and Rh: 600
Unit Type and Rh: 600

## 2016-11-23 MED ORDER — FUROSEMIDE 40 MG PO TABS: ORAL_TABLET | ORAL | 1 refills | Status: DC

## 2016-11-23 NOTE — Telephone Encounter (Signed)
rx resent  

## 2016-11-26 ENCOUNTER — Encounter: Payer: Self-pay | Admitting: Hematology & Oncology

## 2016-11-28 ENCOUNTER — Telehealth: Payer: Self-pay | Admitting: Family Medicine

## 2016-11-28 NOTE — Telephone Encounter (Signed)
Patient contacted office stating that left foot was red and swollen. Transferred called to [facility].

## 2016-11-28 NOTE — Telephone Encounter (Signed)
Patient Name: Jessica Dennis DOB: Feb 24, 1932 Initial Comment Caller states, wanted to see a dr. - she is having swelling, red on one foot - diabetic pt. numbness from condition. Nurse Assessment Nurse: Vallery Sa, RN, Cathy Date/Time (Eastern Time): 11/28/2016 10:27:39 AM Confirm and document reason for call. If symptomatic, describe symptoms. ---Caller states she developed redness and swelling of her left foot about 2 days ago. No fever. Alert and responsive. Does the patient have any new or worsening symptoms? ---Yes Will a triage be completed? ---Yes Related visit to physician within the last 2 weeks? ---No Does the PT have any chronic conditions? (i.e. diabetes, asthma, etc.) ---Yes List chronic conditions. ---Diabetes, Cirrhosis, Anemia Is this a behavioral health or substance abuse call? ---No Guidelines Guideline Title Affirmed Question Affirmed Notes Diabetes - Foot Problems and Questions Severe pain Final Disposition User Go to ED Now Vallery Sa, RN, [name] Referrals [facility] - ED Caller Disagree/Comply Comply Caller Understands Yes PreDisposition Did not know what to do

## 2016-11-29 DIAGNOSIS — L03116 Cellulitis of left lower limb: Secondary | ICD-10-CM | POA: Diagnosis not present

## 2016-11-29 NOTE — Telephone Encounter (Signed)
Follow up call made to patient left message on answering machine regarding problem with foot requested return call.

## 2016-11-30 ENCOUNTER — Telehealth: Payer: Self-pay

## 2016-11-30 NOTE — Telephone Encounter (Signed)
Follow up call made again  to patient. States she went to Urgent Care in [facility] and was given ABO because she had skin tear from Box falling on her foot. States Dr. Kristeen Miss it became infected. States it feels and looks much better now.

## 2016-12-02 ENCOUNTER — Other Ambulatory Visit: Payer: Self-pay | Admitting: Family Medicine

## 2016-12-06 ENCOUNTER — Other Ambulatory Visit: Payer: Self-pay | Admitting: Family Medicine

## 2016-12-06 DIAGNOSIS — M170 Bilateral primary osteoarthritis of knee: Secondary | ICD-10-CM

## 2016-12-18 ENCOUNTER — Encounter: Payer: Self-pay | Admitting: Family

## 2016-12-18 ENCOUNTER — Other Ambulatory Visit (HOSPITAL_BASED_OUTPATIENT_CLINIC_OR_DEPARTMENT_OTHER): Payer: PPO

## 2016-12-18 ENCOUNTER — Ambulatory Visit (HOSPITAL_BASED_OUTPATIENT_CLINIC_OR_DEPARTMENT_OTHER): Payer: PPO | Admitting: Family

## 2016-12-18 ENCOUNTER — Other Ambulatory Visit: Payer: Self-pay

## 2016-12-18 VITALS — BP 128/64 | HR 111 | Temp 98.0°F | Resp 18 | Wt 161.0 lb

## 2016-12-18 DIAGNOSIS — D500 Iron deficiency anemia secondary to blood loss (chronic): Secondary | ICD-10-CM

## 2016-12-18 DIAGNOSIS — C4491 Basal cell carcinoma of skin, unspecified: Secondary | ICD-10-CM

## 2016-12-18 DIAGNOSIS — D696 Thrombocytopenia, unspecified: Secondary | ICD-10-CM | POA: Diagnosis not present

## 2016-12-18 DIAGNOSIS — K922 Gastrointestinal hemorrhage, unspecified: Secondary | ICD-10-CM

## 2016-12-18 DIAGNOSIS — R195 Other fecal abnormalities: Secondary | ICD-10-CM

## 2016-12-18 LAB — CMP (CANCER CENTER ONLY)
ALT(SGPT): 26 U/L (ref 10–47)
AST: 32 U/L (ref 11–38)
Albumin: 3.4 g/dL (ref 3.3–5.5)
Alkaline Phosphatase: 94 U/L — ABNORMAL HIGH (ref 26–84)
BUN, Bld: 33 mg/dL — ABNORMAL HIGH (ref 7–22)
CO2: 28 mEq/L (ref 18–33)
Calcium: 10 mg/dL (ref 8.0–10.3)
Chloride: 101 mEq/L (ref 98–108)
Creat: 1.3 mg/dl — ABNORMAL HIGH (ref 0.6–1.2)
Glucose, Bld: 269 mg/dL — ABNORMAL HIGH (ref 73–118)
Potassium: 4.3 mEq/L (ref 3.3–4.7)
Sodium: 138 mEq/L (ref 128–145)
Total Bilirubin: 1.6 mg/dl (ref 0.20–1.60)
Total Protein: 6.3 g/dL — ABNORMAL LOW (ref 6.4–8.1)

## 2016-12-18 LAB — CBC WITH DIFFERENTIAL (CANCER CENTER ONLY)
BASO#: 0.1 10*3/uL (ref 0.0–0.2)
BASO%: 1 % (ref 0.0–2.0)
EOS%: 4 % (ref 0.0–7.0)
Eosinophils Absolute: 0.2 10*3/uL (ref 0.0–0.5)
HCT: 34.8 % (ref 34.8–46.6)
HGB: 11.5 g/dL — ABNORMAL LOW (ref 11.6–15.9)
LYMPH#: 0.6 10*3/uL — ABNORMAL LOW (ref 0.9–3.3)
LYMPH%: 12 % — ABNORMAL LOW (ref 14.0–48.0)
MCH: 28.8 pg (ref 26.0–34.0)
MCHC: 33 g/dL (ref 32.0–36.0)
MCV: 87 fL (ref 81–101)
MONO#: 0.4 10*3/uL (ref 0.1–0.9)
MONO%: 8 % (ref 0.0–13.0)
NEUT#: 3.8 10*3/uL (ref 1.5–6.5)
NEUT%: 75 % (ref 39.6–80.0)
Platelets: 73 10*3/uL — ABNORMAL LOW (ref 145–400)
RBC: 4 10*6/uL (ref 3.70–5.32)
RDW: 19.6 % — ABNORMAL HIGH (ref 11.1–15.7)
WBC: 5 10*3/uL (ref 3.9–10.0)

## 2016-12-18 LAB — CHCC SATELLITE - SMEAR

## 2016-12-18 NOTE — Progress Notes (Signed)
Hematology and Oncology Follow Up Visit  Jessica Dennis [id number] 06/11/1932 83 y.o. 12/18/2016   Principle Diagnosis:  Chronic thrombocytopenia secondary to cirrhosis and splenomegaly Iron deficiency anemia secondary to GI bleed   Current Therapy:   IV iron as indicated-dose to be given on 11/22/2016 Red blood cell transfusion as needed for variceal bleeding   Interim History:  Jessica Dennis is here today for follow-up. She is doing well and feels much better after receiving IV iron and blood earlier this month.  Her energy has improved. She denies fatigue.  She has had no bleeding, no bruising or petechiae.  No lymphadenopathy found on exam.  No fever, chills, n/v, cough, rash, dizziness, SOB, chest pain, palpitations, abdominal pain or changes in bowel or bladder habits.  No swelling or tenderness in her extremities. The neuropathy in her feet is unchanged.  She has maintained a good appetite and is staying well hydrated. Her blood glucose levels are stable.   ECOG Performance Status: 0 - Asymptomatic  Medications:  Allergies as of 12/18/2016   No Known Allergies     Medication List        Accurate as of 12/18/16  1:47 PM. Always use your most recent med list.          Calcium Carbonate-Vitamin D 600-200 MG-UNIT Caps Take by mouth.   cephALEXin 500 MG capsule Commonly known as:  KEFLEX Take 1 capsule (500 mg total) by mouth 2 (two) times daily.   Fish Oil 1000 MG Caps Take by mouth every morning.   Flax Seed Oil 1000 MG Caps Take by mouth.   furosemide 40 MG tablet Commonly known as:  LASIX Please take 40 mg by mouth daily In the am   gabapentin 100 MG capsule Commonly known as:  NEURONTIN 1 po qhs prn for 3 days then 2 po qhs for 3-4 days then 3 can increase to 3 a day if needed for pain   latanoprost 0.005 % ophthalmic solution Commonly known as:  XALATAN Place 1 drop into both eyes at bedtime.   levothyroxine 75 MCG tablet Commonly known as:   SYNTHROID, LEVOTHROID TAKE ONE TABLET BY MOUTH DAILY   LORazepam 0.5 MG tablet Commonly known as:  ATIVAN Take 1 tablet (0.5 mg total) by mouth daily as needed.   Melatonin 5 MG Caps Take 5 mg by mouth.   meloxicam 7.5 MG tablet Commonly known as:  MOBIC TAKE ONE TABLET BY MOUTH TWO TIMES A DAY   NOVOLOG MIX 70/30 FLEXPEN (70-30) 100 UNIT/ML FlexPen Generic drug:  insulin aspart protamine - aspart 60 Units daily. 35 in the morning and 25 at HS   [place] blood sugar once daily. Dx:E11.9   ACCU-CHEK FASTCLIX LANCETS Misc CHECK BLOOD SUGAR ONCE DAILY.   pantoprazole 40 MG tablet Commonly known as:  PROTONIX Take 1 tablet by mouth  daily   potassium chloride SA 20 MEQ tablet Commonly known as:  KLOR-CON M20 Take 1 tablet (20 mEq total) by mouth daily.   Prednicarbate 0.1 % Crea Apply topically 2 (two) times daily.   simvastatin 20 MG tablet Commonly known as:  ZOCOR Take 1 tablet (20 mg total) by mouth at bedtime.   simvastatin 20 MG tablet Commonly known as:  ZOCOR TAKE ONE TABLET BY MOUTH EVERY NIGHT AT BEDTIME   spironolactone 50 MG tablet Commonly known as:  ALDACTONE TAKE TWO TABLETS BY MOUTH DAILY   vitamin C 1000 MG tablet Take 1,000 mg by  mouth daily.   vitamin E 100 UNIT capsule Take by mouth daily.       Allergies: No Known Allergies  Past Medical History, Surgical history, Social history, and Family History were reviewed and updated.  Review of Systems: All other 10 point review of systems is negative.   Physical Exam:  vitals were not taken for this visit.   Wt Readings from Last 3 Encounters:  11/20/16 168 lb (76.2 kg)  10/26/16 158 lb 9.6 oz (71.9 kg)  10/09/16 153 lb (69.4 kg)    Ocular: Sclerae unicteric, pupils equal, round and reactive to light Ear-nose-throat: Oropharynx clear, dentition fair Lymphatic: No cervical, supraclavicular or axillary adenopathy Lungs no rales or rhonchi, good excursion  bilaterally Heart regular rate and rhythm, no murmur appreciated Abd soft, nontender, positive bowel sounds, no liver or spleen tip palpated on exam, no fluid wave  MSK no focal spinal tenderness, no joint edema Neuro: non-focal, well-oriented, appropriate affect Breasts: Deferred   Lab Results  Component Value Date   WBC 5.0 12/18/2016   HGB 11.5 (L) 12/18/2016   HCT 34.8 12/18/2016   MCV 87 12/18/2016   PLT 73 Platelet count consistent in citrate (L) 12/18/2016   Lab Results  Component Value Date   FERRITIN 9 11/20/2016   IRON 23 (L) 11/20/2016   TIBC 343 11/20/2016   UIBC 320 11/20/2016   IRONPCTSAT 7 (L) 11/20/2016   Lab Results  Component Value Date   RBC 4.00 12/18/2016   No results found for: KPAFRELGTCHN, LAMBDASER, KAPLAMBRATIO No results found for: IGGSERUM, IGA, IGMSERUM No results found for: Odetta Pink, SPEI   Chemistry      Component Value Date/Time   NA 138 12/18/2016 1314   NA 139 03/05/2016 1139   K 4.3 12/18/2016 1314   K 3.6 03/05/2016 1139   CL 101 12/18/2016 1314   CO2 28 12/18/2016 1314   CO2 26 03/05/2016 1139   BUN 33 (H) 12/18/2016 1314   BUN 14.3 03/05/2016 1139   CREATININE 1.3 (H) 12/18/2016 1314   CREATININE 0.7 03/05/2016 1139      Component Value Date/Time   CALCIUM 10.0 12/18/2016 1314   CALCIUM 9.3 03/05/2016 1139   ALKPHOS 94 (H) 12/18/2016 1314   ALKPHOS 127 03/05/2016 1139   AST 32 12/18/2016 1314   AST 29 03/05/2016 1139   ALT 26 12/18/2016 1314   ALT 22 03/05/2016 1139   BILITOT 1.60 12/18/2016 1314   BILITOT 1.75 (H) 03/05/2016 1139      Impression and Plan: Jessica Dennis is a very pleasant 83 yo caucasian female with chronic thrombocytopenia. She is feeling much better since receiving blood and IV iron a few weeks ago. Her Hgb is now 11.5 with an MCV of 87 and platelet count 73. She has had more bleeding.  We will see what her iron studies show and bring her back next  week for an infusion if needed.  We will plan to see her back again in another 2 months for follow-up and lab.  She will contact our office with any questions or concerns. We can certainly see her sooner if need be.   Eliezer Bottom, NP 11/27/20181:47 PM

## 2016-12-19 LAB — IRON AND TIBC
%SAT: 27 % (ref 21–57)
Iron: 85 ug/dL (ref 41–142)
TIBC: 309 ug/dL (ref 236–444)
UIBC: 224 ug/dL (ref 120–384)

## 2016-12-19 LAB — FERRITIN: Ferritin: 31 ng/ml (ref 9–269)

## 2016-12-21 ENCOUNTER — Other Ambulatory Visit: Payer: Self-pay | Admitting: Family Medicine

## 2016-12-21 DIAGNOSIS — F411 Generalized anxiety disorder: Secondary | ICD-10-CM

## 2016-12-21 DIAGNOSIS — K219 Gastro-esophageal reflux disease without esophagitis: Secondary | ICD-10-CM

## 2016-12-21 DIAGNOSIS — M170 Bilateral primary osteoarthritis of knee: Secondary | ICD-10-CM

## 2016-12-24 ENCOUNTER — Ambulatory Visit: Payer: PPO | Admitting: Internal Medicine

## 2016-12-25 DIAGNOSIS — H401132 Primary open-angle glaucoma, bilateral, moderate stage: Secondary | ICD-10-CM | POA: Diagnosis not present

## 2016-12-25 DIAGNOSIS — H52203 Unspecified astigmatism, bilateral: Secondary | ICD-10-CM | POA: Diagnosis not present

## 2016-12-25 DIAGNOSIS — H524 Presbyopia: Secondary | ICD-10-CM | POA: Diagnosis not present

## 2016-12-25 DIAGNOSIS — H2513 Age-related nuclear cataract, bilateral: Secondary | ICD-10-CM | POA: Diagnosis not present

## 2016-12-25 NOTE — Telephone Encounter (Signed)
[facility] requesting refill for lorazepam  Database ran and is on your desk for review  Last filled per database: 12/04/16 Last written: 06/07/16 Last ov: 10/26/16 Next ov: 04/26/17 Contract: due UDS: due

## 2016-12-26 ENCOUNTER — Other Ambulatory Visit: Payer: Self-pay | Admitting: Family Medicine

## 2016-12-26 DIAGNOSIS — K219 Gastro-esophageal reflux disease without esophagitis: Secondary | ICD-10-CM

## 2016-12-26 DIAGNOSIS — I10 Essential (primary) hypertension: Secondary | ICD-10-CM

## 2017-01-09 DIAGNOSIS — M7541 Impingement syndrome of right shoulder: Secondary | ICD-10-CM | POA: Diagnosis not present

## 2017-01-25 ENCOUNTER — Other Ambulatory Visit: Payer: Self-pay | Admitting: Family Medicine

## 2017-01-26 DIAGNOSIS — R69 Illness, unspecified: Secondary | ICD-10-CM | POA: Diagnosis not present

## 2017-02-18 ENCOUNTER — Ambulatory Visit: Payer: Medicare HMO | Admitting: Internal Medicine

## 2017-02-18 ENCOUNTER — Encounter: Payer: Self-pay | Admitting: Internal Medicine

## 2017-02-18 VITALS — Ht 65.75 in | Wt 169.0 lb

## 2017-02-18 DIAGNOSIS — R69 Illness, unspecified: Secondary | ICD-10-CM | POA: Diagnosis not present

## 2017-02-18 DIAGNOSIS — K7031 Alcoholic cirrhosis of liver with ascites: Secondary | ICD-10-CM

## 2017-02-18 NOTE — Patient Instructions (Addendum)
  You have been scheduled for an abdominal ultrasound at [facility] on 02/25/17 at 11:00AM. Please arrive 15 minutes prior to your appointment for registration. Make certain not to have anything to eat or drink 6 hours prior to your appointment. Should you need to reschedule your appointment, please contact radiology at [phone]. This test typically takes about 30 minutes to perform.   Dr Carlean Purl will decide follow up after the ultrasound.    I appreciate the opportunity to care for you. Silvano Rusk, MD, [facility]

## 2017-02-18 NOTE — Progress Notes (Signed)
Jessica Dennis 84 y.o. [birth year] [id number]  Assessment & Plan:   Encounter Diagnosis  Name Primary?  . Alcoholic cirrhosis of liver with ascites ([place]) Yes    1. Liver cirrhosis- Complete US of liver recommended for f/u. If fluid overload is noted, pt recommended to increase spironolactone 50 mg back up to 2 tabs QD. Continue current Lasix regimen and can stay with 1 tab 50 mg spironolactone QD for now till [place] results. Follow up in 6-12 mos - TBA after [place] review   Subjective:   Chief Complaint: cirrhosis f/u  HPI Pt is a very nice white female with hx of alcoholic cirrhosis presenting today for f/u. She lost approx 25 lbs in June 2018 from paracentesis. She was rx'd Lasix 40 mg QAM and spiranolactone 50 mg 2 tabs QD. She reports she has no more fluid overload, she is doing well, and she is currently only taking 1 tablet of spiranolactone QD, she cut her dose in half about a month ago since she was doing well. She has appt for assessment for anemia with her hematologist tomorrow.   No Known Allergies Current Meds  Medication Sig  . ACCU-CHEK FASTCLIX LANCETS MISC CHECK BLOOD SUGAR ONCE DAILY.  Marland Kitchen Ascorbic Acid (VITAMIN C) 1000 MG tablet Take 1,000 mg by mouth daily.  . Calcium Carbonate-Vitamin D 600-200 MG-UNIT CAPS Take by mouth.  . Flaxseed, Linseed, (FLAX SEED OIL) 1000 MG CAPS Take by mouth.  . furosemide (LASIX) 40 MG tablet Please take 40 mg by mouth daily In the am  . latanoprost (XALATAN) 0.005 % ophthalmic solution Place 1 drop into both eyes at bedtime.   Marland Kitchen levothyroxine (SYNTHROID, LEVOTHROID) 75 MCG tablet TAKE ONE TABLET BY MOUTH DAILY  . LORazepam (ATIVAN) 0.5 MG tablet TAKE ONE TABLET BY MOUTH DAILY AS NEEDED  . Melatonin 5 MG CAPS Take 5 mg by mouth.  . meloxicam (MOBIC) 7.5 MG tablet TAKE ONE TABLET BY MOUTH TWICE A DAY  . NOVOLOG MIX 70/30 FLEXPEN (70-30) 100 UNIT/ML FlexPen 60 Units daily. 35 in the morning and 25 at HS  . Omega-3 Fatty Acids (FISH OIL)  1000 MG CAPS Take by mouth every morning.  . pantoprazole (PROTONIX) 40 MG tablet TAKE ONE TABLET BY MOUTH DAILY  . Potassium Chloride ER 20 MEQ TBCR TAKE ONE TABLET BY MOUTH DAILY  . Prednicarbate 0.1 % CREA Apply topically 2 (two) times daily.   . simvastatin (ZOCOR) 20 MG tablet TAKE ONE TABLET BY MOUTH EVERY NIGHT AT BEDTIME  . spironolactone (ALDACTONE) 50 MG tablet TAKE TWO TABLETS BY MOUTH DAILY (Patient taking differently: one a day)  . vitamin E 100 UNIT capsule Take by mouth daily.  . [DISCONTINUED] simvastatin (ZOCOR) 20 MG tablet Take 1 tablet (20 mg total) by mouth at bedtime.   Past Medical History:  Diagnosis Date  . Anemia   . Angiodysplasia of ascending colon 10/25/2014  . Anxiety   . Arthritis   . Borderline diabetes   . Cancer of the skin, basal cell 09/03/2012  . Cirrhosis ([place])   . Diabetes mellitus without complication ([place])   . Diverticular disease   . GERD (gastroesophageal reflux disease)   . Hypertension   . Iron deficiency anemia due to chronic blood loss 01/19/2015  . Portal hypertensive gastropathy ([place]) 08/02/2016   ? Some GAVE also  . Thyroid disease    Past Surgical History:  Procedure Laterality Date  . ABDOMINAL HYSTERECTOMY    . APPENDECTOMY    . COLONOSCOPY    .  ESOPHAGOGASTRODUODENOSCOPY    . IR PARACENTESIS  05/25/2016  . LEG SKIN LESION  BIOPSY / EXCISION  12/11/14  . MOHS SURGERY     ankle  . TONSILLECTOMY    . TOTAL HIP ARTHROPLASTY Bilateral 1993, 2006  . UPPER GASTROINTESTINAL ENDOSCOPY     Social History   Social History Narrative   The patient is divorced. She is originally from [place]. She has 2 sons. 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   08/10/2014   family history includes Bladder Cancer in her father; Diabetes in her mother; Heart attack in her father.   Review of Systems Positive for weight gain. Negative for ascites, swelling of extremities. Negative for all other  systems.  Objective:   Physical Exam  @Ht  5' 5.75" (1.67 m) Comment: w/o shoes  Wt 169 lb (76.7 kg)   BMI 27.49 kg/m @  General:  NAD Eyes:   anicteric Lungs:  clear Heart::  S1S2 no rubs, murmurs or gallops Abdomen:  soft and nontender, BS+ Ext:   no edema, cyanosis or clubbing Neuro:  Alert and oriented x 3, no asterisks   Data Reviewed: prior GI notes, labs, meds, imaging.   Lab Results  Component Value Date   ALT 26 12/18/2016   AST 32 12/18/2016   ALKPHOS 94 (H) 12/18/2016   BILITOT 1.60 12/18/2016   Lab Results  Component Value Date   INR 1.1 (L) 11/20/2016   INR 1.4 (H) 05/24/2016   INR 1.3 (H) 08/10/2014   PROTIME 13.2 11/20/2016

## 2017-02-19 ENCOUNTER — Other Ambulatory Visit: Payer: Self-pay

## 2017-02-19 ENCOUNTER — Inpatient Hospital Stay: Payer: Medicare HMO

## 2017-02-19 ENCOUNTER — Other Ambulatory Visit: Payer: Self-pay | Admitting: Family

## 2017-02-19 ENCOUNTER — Inpatient Hospital Stay: Payer: Medicare HMO | Attending: Family | Admitting: Family

## 2017-02-19 ENCOUNTER — Telehealth: Payer: Self-pay | Admitting: *Deleted

## 2017-02-19 VITALS — BP 101/41 | HR 77 | Temp 98.0°F | Resp 20 | Wt 171.0 lb

## 2017-02-19 VITALS — BP 118/43 | HR 69

## 2017-02-19 DIAGNOSIS — Z79899 Other long term (current) drug therapy: Secondary | ICD-10-CM | POA: Insufficient documentation

## 2017-02-19 DIAGNOSIS — D696 Thrombocytopenia, unspecified: Secondary | ICD-10-CM

## 2017-02-19 DIAGNOSIS — R5381 Other malaise: Secondary | ICD-10-CM | POA: Diagnosis not present

## 2017-02-19 DIAGNOSIS — D6959 Other secondary thrombocytopenia: Secondary | ICD-10-CM | POA: Diagnosis not present

## 2017-02-19 DIAGNOSIS — K7460 Unspecified cirrhosis of liver: Secondary | ICD-10-CM | POA: Diagnosis not present

## 2017-02-19 DIAGNOSIS — R5383 Other fatigue: Secondary | ICD-10-CM | POA: Insufficient documentation

## 2017-02-19 DIAGNOSIS — Z794 Long term (current) use of insulin: Secondary | ICD-10-CM | POA: Diagnosis not present

## 2017-02-19 DIAGNOSIS — N920 Excessive and frequent menstruation with regular cycle: Secondary | ICD-10-CM | POA: Insufficient documentation

## 2017-02-19 DIAGNOSIS — R161 Splenomegaly, not elsewhere classified: Secondary | ICD-10-CM | POA: Insufficient documentation

## 2017-02-19 DIAGNOSIS — D649 Anemia, unspecified: Secondary | ICD-10-CM

## 2017-02-19 DIAGNOSIS — D500 Iron deficiency anemia secondary to blood loss (chronic): Secondary | ICD-10-CM | POA: Insufficient documentation

## 2017-02-19 DIAGNOSIS — K5520 Angiodysplasia of colon without hemorrhage: Secondary | ICD-10-CM

## 2017-02-19 DIAGNOSIS — I864 Gastric varices: Secondary | ICD-10-CM

## 2017-02-19 DIAGNOSIS — R195 Other fecal abnormalities: Secondary | ICD-10-CM

## 2017-02-19 DIAGNOSIS — K219 Gastro-esophageal reflux disease without esophagitis: Secondary | ICD-10-CM

## 2017-02-19 LAB — CMP (CANCER CENTER ONLY)
ALT: 18 U/L (ref 0–55)
AST: 27 U/L (ref 5–34)
Albumin: 3.4 g/dL — ABNORMAL LOW (ref 3.5–5.0)
Alkaline Phosphatase: 82 U/L (ref 26–84)
Anion gap: 4 — ABNORMAL LOW (ref 5–15)
BUN: 28 mg/dL — ABNORMAL HIGH (ref 7–22)
CO2: 27 mmol/L (ref 18–33)
Calcium: 9.1 mg/dL (ref 8.0–10.3)
Chloride: 107 mmol/L (ref 98–108)
Creatinine: 1 mg/dL (ref 0.60–1.10)
Glucose, Bld: 195 mg/dL — ABNORMAL HIGH (ref 73–118)
Potassium: 3.8 mmol/L (ref 3.5–5.1)
Sodium: 138 mmol/L (ref 128–145)
Total Bilirubin: 1.1 mg/dL (ref 0.2–1.2)
Total Protein: 6 g/dL — ABNORMAL LOW (ref 6.4–8.1)

## 2017-02-19 LAB — IRON AND TIBC
Iron: 18 ug/dL — ABNORMAL LOW (ref 41–142)
Saturation Ratios: 5 % — ABNORMAL LOW (ref 21–57)
TIBC: 334 ug/dL (ref 236–444)
UIBC: 316 ug/dL

## 2017-02-19 LAB — CBC WITH DIFFERENTIAL (CANCER CENTER ONLY)
Basophils Absolute: 0 10*3/uL (ref 0.0–0.1)
Basophils Relative: 1 %
Eosinophils Absolute: 0.1 10*3/uL (ref 0.0–0.5)
Eosinophils Relative: 2 %
HCT: 24.1 % — ABNORMAL LOW (ref 34.8–46.6)
Hemoglobin: 7 g/dL — CL (ref 11.6–15.9)
Lymphocytes Relative: 14 %
Lymphs Abs: 0.6 10*3/uL — ABNORMAL LOW (ref 0.9–3.3)
MCH: 21.9 pg — ABNORMAL LOW (ref 26.0–34.0)
MCHC: 29 g/dL — ABNORMAL LOW (ref 32.0–36.0)
MCV: 75.3 fL — ABNORMAL LOW (ref 81.0–101.0)
Monocytes Absolute: 0.3 10*3/uL (ref 0.1–0.9)
Monocytes Relative: 7 %
Neutro Abs: 3.2 10*3/uL (ref 1.5–6.5)
Neutrophils Relative %: 76 %
Platelet Count: 73 10*3/uL — ABNORMAL LOW (ref 145–400)
RBC: 3.2 MIL/uL — ABNORMAL LOW (ref 3.70–5.32)
RDW: 17.5 % — ABNORMAL HIGH (ref 11.1–15.7)
WBC Count: 4.2 10*3/uL (ref 3.9–10.3)

## 2017-02-19 LAB — ABO/RH: ABO/RH(D): A NEG

## 2017-02-19 LAB — SAVE SMEAR

## 2017-02-19 LAB — PREPARE RBC (CROSSMATCH)

## 2017-02-19 LAB — FERRITIN: Ferritin: 9 ng/mL (ref 9–269)

## 2017-02-19 MED ORDER — SODIUM CHLORIDE 0.9 % IV SOLN
510.0000 mg | Freq: Once | INTRAVENOUS | Status: AC
  Administered 2017-02-19: 510 mg via INTRAVENOUS
  Filled 2017-02-19: qty 17

## 2017-02-19 MED ORDER — SODIUM CHLORIDE 0.9 % IJ SOLN
3.0000 mL | Freq: Once | INTRAMUSCULAR | Status: DC | PRN
  Filled 2017-02-19: qty 10

## 2017-02-19 MED ORDER — SODIUM CHLORIDE 0.9 % IJ SOLN
10.0000 mL | INTRAMUSCULAR | Status: DC | PRN
  Filled 2017-02-19: qty 10

## 2017-02-19 MED ORDER — SODIUM CHLORIDE 0.9 % IV SOLN: Freq: Once | INTRAVENOUS | Status: DC

## 2017-02-19 MED ORDER — HEPARIN SOD (PORK) LOCK FLUSH 100 UNIT/ML IV SOLN
250.0000 [IU] | Freq: Once | INTRAVENOUS | Status: DC | PRN
  Filled 2017-02-19: qty 5

## 2017-02-19 MED ORDER — HEPARIN SOD (PORK) LOCK FLUSH 100 UNIT/ML IV SOLN
500.0000 [IU] | Freq: Once | INTRAVENOUS | Status: DC | PRN
  Filled 2017-02-19: qty 5

## 2017-02-19 MED ORDER — ALTEPLASE 2 MG IJ SOLR
2.0000 mg | Freq: Once | INTRAMUSCULAR | Status: DC | PRN
  Filled 2017-02-19: qty 2

## 2017-02-19 NOTE — Telephone Encounter (Signed)
Critical Value Hgb 7.0 [name] NP notified. No orders at this time

## 2017-02-19 NOTE — Patient Instructions (Signed)

## 2017-02-19 NOTE — Progress Notes (Signed)
Hematology and Oncology Follow Up Visit  Tylor Gambrill [id number] 1932-05-24 84 y.o. 02/19/2017   Principle Diagnosis:  Chronic thrombocytopenia secondary to cirrhosis and splenomegaly Iron deficiency anemia secondary to GI bleed   Current Therapy:   IV iron as indicated-dose to be given on 11/22/2016 Red blood cell transfusion as needed for variceal bleeding   Interim History:  Ms. Callaway is here today for follow-up. She is symptomatic with fatigued, "listlessness" and sleeping more than normal.  She denies fever, chills, chewing ice, n/v, cough, rash, dizziness, SOB, chest pain, palpitations, abdominal pain or changes in bowel or bladder habits.  No s/s of distress at this time.  She states that she has had no episodes of bleeding and has been monitoring her stool. No bruising or petechiae.  The neuropathy in her feet is unchanged. No swelling or tenderness in her extremities at this time.  No falls or syncopal episodes.  No lymphadenopathy found on exam.  She has maintained a good appetite and is staying hydrated. Her weight is stable.   ECOG Performance Status: 1 - Symptomatic but completely ambulatory  Medications:  Allergies as of 02/19/2017   No Known Allergies     Medication List        Accurate as of 02/19/17 11:29 AM. Always use your most recent med list.          ACCU-CHEK FASTCLIX LANCETS Misc CHECK BLOOD SUGAR ONCE DAILY.   Calcium Carbonate-Vitamin D 600-200 MG-UNIT Caps Take by mouth.   Fish Oil 1000 MG Caps Take by mouth every morning.   Flax Seed Oil 1000 MG Caps Take by mouth.   furosemide 40 MG tablet Commonly known as:  LASIX Please take 40 mg by mouth daily In the am   latanoprost 0.005 % ophthalmic solution Commonly known as:  XALATAN Place 1 drop into both eyes at bedtime.   levothyroxine 75 MCG tablet Commonly known as:  SYNTHROID, LEVOTHROID TAKE ONE TABLET BY MOUTH DAILY   LORazepam 0.5 MG tablet Commonly known as:  ATIVAN TAKE  ONE TABLET BY MOUTH DAILY AS NEEDED   Melatonin 5 MG Caps Take 5 mg by mouth.   meloxicam 7.5 MG tablet Commonly known as:  MOBIC TAKE ONE TABLET BY MOUTH TWICE A DAY   NOVOLOG MIX 70/30 FLEXPEN (70-30) 100 UNIT/ML FlexPen Generic drug:  insulin aspart protamine - aspart 60 Units daily. 35 in the morning and 25 at HS   pantoprazole 40 MG tablet Commonly known as:  PROTONIX TAKE ONE TABLET BY MOUTH DAILY   Potassium Chloride ER 20 MEQ Tbcr TAKE ONE TABLET BY MOUTH DAILY   Prednicarbate 0.1 % Crea Apply topically 2 (two) times daily.   simvastatin 20 MG tablet Commonly known as:  ZOCOR TAKE ONE TABLET BY MOUTH EVERY NIGHT AT BEDTIME   spironolactone 50 MG tablet Commonly known as:  ALDACTONE TAKE TWO TABLETS BY MOUTH DAILY   vitamin C 1000 MG tablet Take 1,000 mg by mouth daily.   vitamin E 100 UNIT capsule Take by mouth daily.       Allergies: No Known Allergies  Past Medical History, Surgical history, Social history, and Family History were reviewed and updated.  Review of Systems: All other 10 point review of systems is negative.   Physical Exam:  weight is 171 lb (77.6 kg). Her oral temperature is 98 F (36.7 C). Her blood pressure is 101/41 (abnormal) and her pulse is 77. Her respiration is 20 and oxygen saturation is 100%.  Wt Readings from Last 3 Encounters:  02/19/17 171 lb (77.6 kg)  02/18/17 169 lb (76.7 kg)  12/18/16 161 lb (73 kg)    Ocular: Sclerae unicteric, pupils equal, round and reactive to light Ear-nose-throat: Oropharynx clear, dentition fair Lymphatic: No cervical, supraclavicular or axillary adenopathy Lungs no rales or rhonchi, good excursion bilaterally Heart regular rate and rhythm, no murmur appreciated Abd soft, nontender, positive bowel sounds, no liver or spleen tip palpated on exam, no fluid wave  MSK no focal spinal tenderness, no joint edema Neuro: non-focal, well-oriented, appropriate affect Breasts: Deferred   Lab  Results  Component Value Date   WBC 5.0 12/18/2016   HGB 11.5 (L) 12/18/2016   HCT 34.8 12/18/2016   MCV 87 12/18/2016   PLT 73 Platelet count consistent in citrate (L) 12/18/2016   Lab Results  Component Value Date   FERRITIN 31 12/18/2016   IRON 85 12/18/2016   TIBC 309 12/18/2016   UIBC 224 12/18/2016   IRONPCTSAT 27 12/18/2016   Lab Results  Component Value Date   RBC 4.00 12/18/2016   No results found for: KPAFRELGTCHN, LAMBDASER, KAPLAMBRATIO No results found for: IGGSERUM, IGA, IGMSERUM No results found for: Odetta Pink, SPEI   Chemistry      Component Value Date/Time   NA 138 12/18/2016 1314   NA 139 03/05/2016 1139   K 4.3 12/18/2016 1314   K 3.6 03/05/2016 1139   CL 101 12/18/2016 1314   CO2 28 12/18/2016 1314   CO2 26 03/05/2016 1139   BUN 33 (H) 12/18/2016 1314   BUN 14.3 03/05/2016 1139   CREATININE 1.3 (H) 12/18/2016 1314   CREATININE 0.7 03/05/2016 1139      Component Value Date/Time   CALCIUM 10.0 12/18/2016 1314   CALCIUM 9.3 03/05/2016 1139   ALKPHOS 94 (H) 12/18/2016 1314   ALKPHOS 127 03/05/2016 1139   AST 32 12/18/2016 1314   AST 29 03/05/2016 1139   ALT 26 12/18/2016 1314   ALT 22 03/05/2016 1139   BILITOT 1.60 12/18/2016 1314   BILITOT 1.75 (H) 03/05/2016 1139      Impression and Plan: Ms. Gorr is a very pleasant 84 yo caucasian female with chronic thrombocytopenia and iron deficiency anemia secondary to chronic blood loss.  Her Hgb today is 7.0 and she is symptomatic with fatigue and sleeping more than usual.  We will give her IV iron today and bring her in on Friday (02/22/17) for 2 units of blood.  We will see what her erythropoietin level shows. She may benefit from Aranesp.  We will go ahead and plan to see her back in 6 weeks for follow-up and repeat lab.  She will contact our office with any questions or concerns. We can certainly see her sooner if need be.   [name], NP 1/29/201911:29 AM

## 2017-02-20 LAB — ERYTHROPOIETIN: Erythropoietin: 220.2 m[IU]/mL — ABNORMAL HIGH (ref 2.6–18.5)

## 2017-02-22 ENCOUNTER — Ambulatory Visit (HOSPITAL_COMMUNITY)
Admission: RE | Admit: 2017-02-22 | Discharge: 2017-02-22 | Disposition: A | Discharge status: Home / Self Care | Payer: Medicare HMO | Source: Ambulatory Visit | Attending: Hematology & Oncology | Admitting: Hematology & Oncology

## 2017-02-22 ENCOUNTER — Inpatient Hospital Stay: Payer: Medicare HMO | Attending: Family

## 2017-02-22 DIAGNOSIS — D500 Iron deficiency anemia secondary to blood loss (chronic): Secondary | ICD-10-CM | POA: Insufficient documentation

## 2017-02-22 DIAGNOSIS — N920 Excessive and frequent menstruation with regular cycle: Secondary | ICD-10-CM | POA: Diagnosis not present

## 2017-02-22 DIAGNOSIS — D6959 Other secondary thrombocytopenia: Secondary | ICD-10-CM | POA: Insufficient documentation

## 2017-02-22 DIAGNOSIS — D649 Anemia, unspecified: Secondary | ICD-10-CM

## 2017-02-22 MED ORDER — SODIUM CHLORIDE 0.9 % IV SOLN
250.0000 mL | Freq: Once | INTRAVENOUS | Status: AC
  Administered 2017-02-22: 250 mL via INTRAVENOUS

## 2017-02-22 MED ORDER — DIPHENHYDRAMINE HCL 25 MG PO CAPS
25.0000 mg | ORAL_CAPSULE | Freq: Once | ORAL | Status: AC
  Administered 2017-02-22: 25 mg via ORAL

## 2017-02-22 MED ORDER — FUROSEMIDE 10 MG/ML IJ SOLN: 20.0000 mg | Freq: Once | INTRAMUSCULAR | Status: DC

## 2017-02-22 MED ORDER — ACETAMINOPHEN 325 MG PO TABS
650.0000 mg | ORAL_TABLET | Freq: Once | ORAL | Status: AC
  Administered 2017-02-22: 650 mg via ORAL

## 2017-02-22 NOTE — Patient Instructions (Signed)
Blood Transfusion, Adult, Care After This sheet gives you information about how to care for yourself after your procedure. Your health care provider may also give you more specific instructions. If you have problems or questions, contact your health care provider. What can I expect after the procedure? After your procedure, it is common to have:  Bruising and soreness where the IV tube was inserted.  Headache.  Follow these instructions at home:  Take over-the-counter and prescription medicines only as told by your health care provider.  Return to your normal activities as told by your health care provider.  Follow instructions from your health care provider about how to take care of your IV insertion site. Make sure you: ? Wash your hands with soap and water before you change your bandage (dressing). If soap and water are not available, use hand sanitizer. ? Change your dressing as told by your health care provider.  Check your IV insertion site every day for signs of infection. Check for: ? More redness, swelling, or pain. ? More fluid or blood. ? Warmth. ? Pus or a bad smell. Contact a health care provider if:  You have more redness, swelling, or pain around the IV insertion site.  You have more fluid or blood coming from the IV insertion site.  Your IV insertion site feels warm to the touch.  You have pus or a bad smell coming from the IV insertion site.  Your urine turns pink, red, or brown.  You feel weak after doing your normal activities. Get help right away if:  You have signs of a serious allergic or immune system reaction, including: ? Itchiness. ? Hives. ? Trouble breathing. ? Anxiety. ? Chest or lower back pain. ? Fever, flushing, and chills. ? Rapid pulse. ? Rash. ? Diarrhea. ? Vomiting. ? Dark urine. ? Serious headache. ? Dizziness. ? Stiff neck. ? Yellow coloration of the face or the white parts of the eyes (jaundice). This information is not  intended to replace advice given to you by your health care provider. Make sure you discuss any questions you have with your health care provider. Document Released: 01/29/2014 Document Revised: 09/07/2015 Document Reviewed: 07/25/2015 Elsevier Interactive Patient Education  2018 [employer]  

## 2017-02-23 LAB — TYPE AND SCREEN
ABO/RH(D): A NEG
Antibody Screen: NEGATIVE
Unit division: 0
Unit division: 0

## 2017-02-23 LAB — BPAM RBC
Blood Product Expiration Date: 201902112359
Blood Product Expiration Date: 201902212359
ISSUE DATE / TIME: 201902010812
ISSUE DATE / TIME: 201902010812
Unit Type and Rh: 600
Unit Type and Rh: 600

## 2017-02-24 ENCOUNTER — Other Ambulatory Visit: Payer: Self-pay | Admitting: Internal Medicine

## 2017-02-24 ENCOUNTER — Other Ambulatory Visit: Payer: Self-pay | Admitting: Family Medicine

## 2017-02-24 DIAGNOSIS — K219 Gastro-esophageal reflux disease without esophagitis: Secondary | ICD-10-CM

## 2017-02-25 ENCOUNTER — Ambulatory Visit (HOSPITAL_BASED_OUTPATIENT_CLINIC_OR_DEPARTMENT_OTHER)
Admission: RE | Admit: 2017-02-25 | Discharge: 2017-02-25 | Disposition: A | Discharge status: Home / Self Care | Payer: Medicare HMO | Source: Ambulatory Visit | Attending: Internal Medicine | Admitting: Internal Medicine

## 2017-02-25 DIAGNOSIS — K7031 Alcoholic cirrhosis of liver with ascites: Secondary | ICD-10-CM

## 2017-02-25 DIAGNOSIS — R161 Splenomegaly, not elsewhere classified: Secondary | ICD-10-CM | POA: Insufficient documentation

## 2017-02-25 DIAGNOSIS — R69 Illness, unspecified: Secondary | ICD-10-CM | POA: Diagnosis not present

## 2017-02-25 NOTE — Telephone Encounter (Signed)
May I refill Sir? 

## 2017-02-26 ENCOUNTER — Ambulatory Visit: Payer: Medicare HMO

## 2017-02-28 ENCOUNTER — Inpatient Hospital Stay: Payer: Medicare HMO

## 2017-02-28 VITALS — BP 122/67 | HR 80 | Temp 98.0°F | Resp 20

## 2017-02-28 DIAGNOSIS — K5520 Angiodysplasia of colon without hemorrhage: Secondary | ICD-10-CM

## 2017-02-28 DIAGNOSIS — K219 Gastro-esophageal reflux disease without esophagitis: Secondary | ICD-10-CM

## 2017-02-28 DIAGNOSIS — I864 Gastric varices: Secondary | ICD-10-CM

## 2017-02-28 DIAGNOSIS — D6959 Other secondary thrombocytopenia: Secondary | ICD-10-CM | POA: Diagnosis not present

## 2017-02-28 DIAGNOSIS — N920 Excessive and frequent menstruation with regular cycle: Secondary | ICD-10-CM | POA: Diagnosis not present

## 2017-02-28 DIAGNOSIS — R195 Other fecal abnormalities: Secondary | ICD-10-CM

## 2017-02-28 DIAGNOSIS — D500 Iron deficiency anemia secondary to blood loss (chronic): Secondary | ICD-10-CM

## 2017-02-28 MED ORDER — SODIUM CHLORIDE 0.9 % IV SOLN
Freq: Once | INTRAVENOUS | Status: AC
Start: 2017-02-28 — End: 2017-02-28
  Administered 2017-02-28: 10:00:00 via INTRAVENOUS

## 2017-02-28 MED ORDER — SODIUM CHLORIDE 0.9 % IV SOLN
510.0000 mg | Freq: Once | INTRAVENOUS | Status: AC
  Administered 2017-02-28: 510 mg via INTRAVENOUS
  Filled 2017-02-28: qty 17

## 2017-02-28 NOTE — Progress Notes (Signed)
No ascites seen and liver stable no lesions My Chart message Can stay on current diuretic regimen Se me again in 6-9 mos

## 2017-02-28 NOTE — Patient Instructions (Signed)

## 2017-03-14 ENCOUNTER — Other Ambulatory Visit: Payer: Self-pay | Admitting: Family Medicine

## 2017-03-14 DIAGNOSIS — Z1231 Encounter for screening mammogram for malignant neoplasm of breast: Secondary | ICD-10-CM

## 2017-03-20 DIAGNOSIS — E7800 Pure hypercholesterolemia, unspecified: Secondary | ICD-10-CM | POA: Diagnosis not present

## 2017-03-20 DIAGNOSIS — E039 Hypothyroidism, unspecified: Secondary | ICD-10-CM | POA: Diagnosis not present

## 2017-03-20 DIAGNOSIS — I10 Essential (primary) hypertension: Secondary | ICD-10-CM | POA: Diagnosis not present

## 2017-03-20 DIAGNOSIS — E1165 Type 2 diabetes mellitus with hyperglycemia: Secondary | ICD-10-CM | POA: Diagnosis not present

## 2017-03-22 ENCOUNTER — Other Ambulatory Visit: Payer: Self-pay | Admitting: *Deleted

## 2017-03-22 MED ORDER — SIMVASTATIN 20 MG PO TABS: 20.0000 mg | ORAL_TABLET | Freq: Every day | ORAL | 1 refills | Status: DC

## 2017-03-25 DIAGNOSIS — H0100B Unspecified blepharitis left eye, upper and lower eyelids: Secondary | ICD-10-CM | POA: Diagnosis not present

## 2017-03-25 DIAGNOSIS — H524 Presbyopia: Secondary | ICD-10-CM | POA: Diagnosis not present

## 2017-03-25 DIAGNOSIS — H401132 Primary open-angle glaucoma, bilateral, moderate stage: Secondary | ICD-10-CM | POA: Diagnosis not present

## 2017-03-25 DIAGNOSIS — H0100A Unspecified blepharitis right eye, upper and lower eyelids: Secondary | ICD-10-CM | POA: Diagnosis not present

## 2017-03-25 DIAGNOSIS — H52203 Unspecified astigmatism, bilateral: Secondary | ICD-10-CM | POA: Diagnosis not present

## 2017-03-25 DIAGNOSIS — H2513 Age-related nuclear cataract, bilateral: Secondary | ICD-10-CM | POA: Diagnosis not present

## 2017-03-27 DIAGNOSIS — M7062 Trochanteric bursitis, left hip: Secondary | ICD-10-CM | POA: Diagnosis not present

## 2017-03-27 DIAGNOSIS — Z96642 Presence of left artificial hip joint: Secondary | ICD-10-CM | POA: Diagnosis not present

## 2017-03-27 DIAGNOSIS — M25552 Pain in left hip: Secondary | ICD-10-CM | POA: Diagnosis not present

## 2017-04-09 ENCOUNTER — Other Ambulatory Visit: Payer: Self-pay

## 2017-04-09 ENCOUNTER — Inpatient Hospital Stay: Payer: Medicare HMO | Attending: Family | Admitting: Family

## 2017-04-09 ENCOUNTER — Inpatient Hospital Stay: Payer: Medicare HMO

## 2017-04-09 ENCOUNTER — Encounter: Payer: Self-pay | Admitting: Family

## 2017-04-09 VITALS — BP 113/55 | HR 80 | Temp 97.8°F | Resp 19 | Wt 161.0 lb

## 2017-04-09 DIAGNOSIS — R161 Splenomegaly, not elsewhere classified: Secondary | ICD-10-CM | POA: Diagnosis not present

## 2017-04-09 DIAGNOSIS — D649 Anemia, unspecified: Secondary | ICD-10-CM

## 2017-04-09 DIAGNOSIS — D6959 Other secondary thrombocytopenia: Secondary | ICD-10-CM | POA: Insufficient documentation

## 2017-04-09 DIAGNOSIS — R5383 Other fatigue: Secondary | ICD-10-CM | POA: Diagnosis not present

## 2017-04-09 DIAGNOSIS — D500 Iron deficiency anemia secondary to blood loss (chronic): Secondary | ICD-10-CM

## 2017-04-09 DIAGNOSIS — K7460 Unspecified cirrhosis of liver: Secondary | ICD-10-CM | POA: Diagnosis not present

## 2017-04-09 DIAGNOSIS — Z8719 Personal history of other diseases of the digestive system: Secondary | ICD-10-CM | POA: Diagnosis not present

## 2017-04-09 DIAGNOSIS — Z79899 Other long term (current) drug therapy: Secondary | ICD-10-CM | POA: Diagnosis not present

## 2017-04-09 DIAGNOSIS — D696 Thrombocytopenia, unspecified: Secondary | ICD-10-CM

## 2017-04-09 LAB — CMP (CANCER CENTER ONLY)
ALT: 23 U/L (ref 0–55)
AST: 24 U/L (ref 5–34)
Albumin: 3.2 g/dL — ABNORMAL LOW (ref 3.5–5.0)
Alkaline Phosphatase: 101 U/L (ref 40–150)
Anion gap: 7 (ref 3–11)
BUN: 45 mg/dL — ABNORMAL HIGH (ref 7–26)
CO2: 25 mmol/L (ref 22–29)
Calcium: 9.3 mg/dL (ref 8.4–10.4)
Chloride: 101 mmol/L (ref 98–109)
Creatinine: 1.35 mg/dL — ABNORMAL HIGH (ref 0.60–1.10)
GFR, Est AFR Am: 41 mL/min — ABNORMAL LOW (ref >60)
GFR, Estimated: 35 mL/min — ABNORMAL LOW (ref >60)
Glucose, Bld: 183 mg/dL — ABNORMAL HIGH (ref 70–140)
Potassium: 4.8 mmol/L (ref 3.5–5.1)
Sodium: 133 mmol/L — ABNORMAL LOW (ref 136–145)
Total Bilirubin: 1.1 mg/dL (ref 0.2–1.2)
Total Protein: 6.4 g/dL (ref 6.4–8.3)

## 2017-04-09 LAB — IRON AND TIBC
Iron: 77 ug/dL (ref 41–142)
Saturation Ratios: 32 % (ref 21–57)
TIBC: 243 ug/dL (ref 236–444)
UIBC: 166 ug/dL

## 2017-04-09 LAB — CBC WITH DIFFERENTIAL (CANCER CENTER ONLY)
Basophils Absolute: 0.1 10*3/uL (ref 0.0–0.1)
Basophils Relative: 1 %
Eosinophils Absolute: 0.1 10*3/uL (ref 0.0–0.5)
Eosinophils Relative: 1 %
HCT: 32.6 % — ABNORMAL LOW (ref 34.8–46.6)
Hemoglobin: 10.9 g/dL — ABNORMAL LOW (ref 11.6–15.9)
Lymphocytes Relative: 8 %
Lymphs Abs: 0.6 10*3/uL — ABNORMAL LOW (ref 0.9–3.3)
MCH: 29.8 pg (ref 26.0–34.0)
MCHC: 33.4 g/dL (ref 32.0–36.0)
MCV: 89.1 fL (ref 81.0–101.0)
Monocytes Absolute: 0.6 10*3/uL (ref 0.1–0.9)
Monocytes Relative: 7 %
Neutro Abs: 6.9 10*3/uL — ABNORMAL HIGH (ref 1.5–6.5)
Neutrophils Relative %: 83 %
Platelet Count: 82 10*3/uL — ABNORMAL LOW (ref 145–400)
RBC: 3.66 MIL/uL — ABNORMAL LOW (ref 3.70–5.32)
RDW: 24.1 % — ABNORMAL HIGH (ref 11.1–15.7)
WBC Count: 8.2 10*3/uL (ref 3.9–10.0)

## 2017-04-09 LAB — SAMPLE TO BLOOD BANK

## 2017-04-09 LAB — FERRITIN: Ferritin: 88 ng/mL (ref 9–269)

## 2017-04-09 NOTE — Progress Notes (Signed)
Hematology and Oncology Follow Up Visit  Jessica Dennis [id number] July 19, 1932 84 y.o. 04/09/2017   Principle Diagnosis:  Chronic thrombocytopenia secondary to cirrhosis and splenomegaly Iron deficiency anemia secondary to GI bleed  Current Therapy:   IV iron as indicated - last received in Jan/Feb x 2 Red blood cell transfusion as needed for variceal bleeding - last received February 2019   Interim History:  Jessica Dennis is here today for follow-up. She has responded nicely to the blood and IV iron she received in February. She still has some mild fatigue at times.  No fever, chills, chewing ice, n/v, cough, rash, dizziness, SOB, chest pain, palpitations, abdominal pain or changes in bowel or bladder habits.  No episodes of bleeding. She does bruise easily on Mobic BID.  She has had some occasional dizziness with muscle relaxer. She also stumbles easily and has had some falls due to the neuropathy in her feet. This is unchanged.  No lymphadenopathy found on exam.  No swelling or tenderness in her extremities.  She has a good appetite and is staying well hydrated. Her weight is stable.   ECOG Performance Status: 1 - Symptomatic but completely ambulatory  Medications:  Allergies as of 04/09/2017      Reactions   Other       Medication List        Accurate as of 04/09/17 12:08 PM. Always use your most recent med list.          ACCU-CHEK FASTCLIX LANCETS Misc CHECK BLOOD SUGAR ONCE DAILY.   Calcium Carbonate-Vitamin D 600-200 MG-UNIT Caps Take by mouth.   Fish Oil 1000 MG Caps Take by mouth every morning.   Flax Seed Oil 1000 MG Caps Take by mouth.   furosemide 40 MG tablet Commonly known as:  LASIX Please take 40 mg by mouth daily In the am   glucose blood test strip Accu-Chek Fastclix Lancet Drum   latanoprost 0.005 % ophthalmic solution Commonly known as:  XALATAN Place 1 drop into both eyes at bedtime.   latanoprost 0.005 % ophthalmic solution Commonly  known as:  XALATAN 1 drop nightly.   levothyroxine 75 MCG tablet Commonly known as:  SYNTHROID, LEVOTHROID TAKE ONE TABLET BY MOUTH DAILY   LORazepam 0.5 MG tablet Commonly known as:  ATIVAN TAKE ONE TABLET BY MOUTH DAILY AS NEEDED   Melatonin 5 MG Caps Take 5 mg by mouth.   meloxicam 7.5 MG tablet Commonly known as:  MOBIC TAKE ONE TABLET BY MOUTH TWICE A DAY   NOVOLOG MIX 70/30 FLEXPEN (70-30) 100 UNIT/ML FlexPen Generic drug:  insulin aspart protamine - aspart 60 Units daily. 35 in the morning and 25 at HS   pantoprazole 40 MG tablet Commonly known as:  PROTONIX TAKE ONE TABLET BY MOUTH DAILY   potassium chloride 20 MEQ packet Commonly known as:  KLOR-CON potassium chloride ER 20 mEq tablet,extended release   Potassium Chloride ER 20 MEQ Tbcr TAKE ONE TABLET BY MOUTH DAILY   Prednicarbate 0.1 % Crea Apply topically 2 (two) times daily.   simvastatin 20 MG tablet Commonly known as:  ZOCOR Take 1 tablet (20 mg total) by mouth at bedtime.   spironolactone 50 MG tablet Commonly known as:  ALDACTONE TAKE TWO TABLETS BY MOUTH DAILY   vitamin C 1000 MG tablet Take 1,000 mg by mouth daily.   vitamin E 100 UNIT capsule Take by mouth daily.       Allergies:  Allergies  Allergen Reactions  . Other  Past Medical History, Surgical history, Social history, and Family History were reviewed and updated.  Review of Systems: All other 10 point review of systems is negative.   Physical Exam:  weight is 161 lb (73 kg). Her oral temperature is 97.8 F (36.6 C). Her blood pressure is 113/55 (abnormal) and her pulse is 80. Her respiration is 19 and oxygen saturation is 100%.   Wt Readings from Last 3 Encounters:  04/09/17 161 lb (73 kg)  02/19/17 171 lb (77.6 kg)  02/18/17 169 lb (76.7 kg)    Ocular: Sclerae unicteric, pupils equal, round and reactive to light Ear-nose-throat: Oropharynx clear, dentition fair Lymphatic: No cervical, supraclavicular or  axillary adenopathy Lungs no rales or rhonchi, good excursion bilaterally Heart regular rate and rhythm, no murmur appreciated Abd soft, nontender, positive bowel sounds, no liver or spleen tip palpated on exam, no fluid wave  MSK no focal spinal tenderness, no joint edema Neuro: non-focal, well-oriented, appropriate affect Breasts: Deferred   Lab Results  Component Value Date   WBC 8.2 04/09/2017   HGB 11.5 (L) 12/18/2016   HCT 32.6 (L) 04/09/2017   MCV 89.1 04/09/2017   PLT 82 (L) 04/09/2017   Lab Results  Component Value Date   FERRITIN 9 02/19/2017   IRON 18 (L) 02/19/2017   TIBC 334 02/19/2017   UIBC 316 02/19/2017   IRONPCTSAT 5 (L) 02/19/2017   Lab Results  Component Value Date   RBC 3.66 (L) 04/09/2017   No results found for: KPAFRELGTCHN, LAMBDASER, KAPLAMBRATIO No results found for: IGGSERUM, IGA, IGMSERUM No results found for: Odetta Pink, SPEI   Chemistry      Component Value Date/Time   NA 138 02/19/2017 1101   NA 138 12/18/2016 1314   NA 139 03/05/2016 1139   K 3.8 02/19/2017 1101   K 4.3 12/18/2016 1314   K 3.6 03/05/2016 1139   CL 107 02/19/2017 1101   CL 101 12/18/2016 1314   CO2 27 02/19/2017 1101   CO2 28 12/18/2016 1314   CO2 26 03/05/2016 1139   BUN 28 (H) 02/19/2017 1101   BUN 33 (H) 12/18/2016 1314   BUN 14.3 03/05/2016 1139   CREATININE 1.00 02/19/2017 1101   CREATININE 1.3 (H) 12/18/2016 1314   CREATININE 0.7 03/05/2016 1139      Component Value Date/Time   CALCIUM 9.1 02/19/2017 1101   CALCIUM 10.0 12/18/2016 1314   CALCIUM 9.3 03/05/2016 1139   ALKPHOS 82 02/19/2017 1101   ALKPHOS 94 (H) 12/18/2016 1314   ALKPHOS 127 03/05/2016 1139   AST 27 02/19/2017 1101   AST 29 03/05/2016 1139   ALT 18 02/19/2017 1101   ALT 26 12/18/2016 1314   ALT 22 03/05/2016 1139   BILITOT 1.1 02/19/2017 1101   BILITOT 1.75 (H) 03/05/2016 1139      Impression and Plan: Jessica Dennis is a very  pleasant 84 yo caucasian female with chronic thrombocytopenia and iron deficiency anemia secondary to GI blood loss. She is feeling much better but still has some fatigue.  Her Hgb is 10.9 at this time so no blood needed.  We will see what her iron studies show and bring her back in for infusion if needed.  We will go ahead and plan to see her back in another 6 weeks for follow-up.  She will contact our office with any questions or concerns. We can certainly see her sooner if need be.   [name], NP 3/19/201912:08 PM

## 2017-04-18 ENCOUNTER — Ambulatory Visit (HOSPITAL_BASED_OUTPATIENT_CLINIC_OR_DEPARTMENT_OTHER)
Admission: RE | Admit: 2017-04-18 | Discharge: 2017-04-18 | Disposition: A | Discharge status: Home / Self Care | Payer: Medicare HMO | Source: Ambulatory Visit | Attending: Family Medicine | Admitting: Family Medicine

## 2017-04-18 DIAGNOSIS — Z1231 Encounter for screening mammogram for malignant neoplasm of breast: Secondary | ICD-10-CM | POA: Diagnosis not present

## 2017-04-19 ENCOUNTER — Telehealth: Payer: Self-pay | Admitting: *Deleted

## 2017-04-19 ENCOUNTER — Other Ambulatory Visit: Payer: Self-pay | Admitting: Family Medicine

## 2017-04-19 DIAGNOSIS — R928 Other abnormal and inconclusive findings on diagnostic imaging of breast: Secondary | ICD-10-CM

## 2017-04-19 NOTE — Telephone Encounter (Signed)
Received Physician Orders from [place]; forwarded to provider/SLS 03/29

## 2017-04-24 ENCOUNTER — Other Ambulatory Visit: Payer: Medicare HMO

## 2017-04-25 ENCOUNTER — Other Ambulatory Visit: Payer: Self-pay | Admitting: Family Medicine

## 2017-04-25 ENCOUNTER — Other Ambulatory Visit: Payer: Self-pay | Admitting: Internal Medicine

## 2017-04-25 DIAGNOSIS — L570 Actinic keratosis: Secondary | ICD-10-CM | POA: Diagnosis not present

## 2017-04-25 DIAGNOSIS — Z85828 Personal history of other malignant neoplasm of skin: Secondary | ICD-10-CM | POA: Diagnosis not present

## 2017-04-25 DIAGNOSIS — K7031 Alcoholic cirrhosis of liver with ascites: Secondary | ICD-10-CM

## 2017-04-25 DIAGNOSIS — Z08 Encounter for follow-up examination after completed treatment for malignant neoplasm: Secondary | ICD-10-CM | POA: Diagnosis not present

## 2017-04-25 DIAGNOSIS — K219 Gastro-esophageal reflux disease without esophagitis: Secondary | ICD-10-CM

## 2017-04-25 NOTE — Telephone Encounter (Signed)
OK to refill x 6 mos  Ask her to do a BMET next week though  Kidney function has deteriorated some and I want to recheck

## 2017-04-25 NOTE — Telephone Encounter (Signed)
How many refills Sir? 

## 2017-04-26 ENCOUNTER — Encounter: Payer: Self-pay | Admitting: Family Medicine

## 2017-04-26 ENCOUNTER — Ambulatory Visit (INDEPENDENT_AMBULATORY_CARE_PROVIDER_SITE_OTHER): Payer: Medicare HMO | Admitting: Family Medicine

## 2017-04-26 VITALS — BP 152/65 | HR 91 | Temp 98.2°F | Resp 16 | Ht 68.0 in | Wt 163.0 lb

## 2017-04-26 DIAGNOSIS — R739 Hyperglycemia, unspecified: Secondary | ICD-10-CM | POA: Diagnosis not present

## 2017-04-26 DIAGNOSIS — E785 Hyperlipidemia, unspecified: Secondary | ICD-10-CM

## 2017-04-26 DIAGNOSIS — E039 Hypothyroidism, unspecified: Secondary | ICD-10-CM

## 2017-04-26 DIAGNOSIS — Z79899 Other long term (current) drug therapy: Secondary | ICD-10-CM | POA: Diagnosis not present

## 2017-04-26 DIAGNOSIS — I10 Essential (primary) hypertension: Secondary | ICD-10-CM | POA: Diagnosis not present

## 2017-04-26 DIAGNOSIS — R252 Cramp and spasm: Secondary | ICD-10-CM

## 2017-04-26 DIAGNOSIS — D500 Iron deficiency anemia secondary to blood loss (chronic): Secondary | ICD-10-CM

## 2017-04-26 LAB — COMPREHENSIVE METABOLIC PANEL
ALT: 22 U/L (ref 0–35)
AST: 25 U/L (ref 0–37)
Albumin: 3.8 g/dL (ref 3.5–5.2)
Alkaline Phosphatase: 94 U/L (ref 39–117)
BUN: 27 mg/dL — ABNORMAL HIGH (ref 6–23)
CO2: 26 mEq/L (ref 19–32)
Calcium: 9 mg/dL (ref 8.4–10.5)
Chloride: 100 mEq/L (ref 96–112)
Creatinine, Ser: 1.14 mg/dL (ref 0.40–1.20)
GFR: 48.46 mL/min — ABNORMAL LOW (ref >60.00)
Glucose, Bld: 297 mg/dL — ABNORMAL HIGH (ref 70–99)
Potassium: 4.5 mEq/L (ref 3.5–5.1)
Sodium: 134 mEq/L — ABNORMAL LOW (ref 135–145)
Total Bilirubin: 1.3 mg/dL — ABNORMAL HIGH (ref 0.2–1.2)
Total Protein: 6.4 g/dL (ref 6.0–8.3)

## 2017-04-26 LAB — LIPID PANEL
Cholesterol: 112 mg/dL (ref 0–200)
HDL: 56 mg/dL (ref >39.00)
LDL Cholesterol: 37 mg/dL (ref 0–99)
NonHDL: 56.25
Total CHOL/HDL Ratio: 2
Triglycerides: 95 mg/dL (ref 0.0–149.0)
VLDL: 19 mg/dL (ref 0.0–40.0)

## 2017-04-26 LAB — MAGNESIUM: Magnesium: 1.9 mg/dL (ref 1.5–2.5)

## 2017-04-26 LAB — HEMOGLOBIN A1C: Hgb A1c MFr Bld: 5.9 % (ref 4.6–6.5)

## 2017-04-26 LAB — TSH: TSH: 2.25 u[IU]/mL (ref 0.35–4.50)

## 2017-04-26 LAB — PHOSPHORUS: Phosphorus: 3.4 mg/dL (ref 2.3–4.6)

## 2017-04-26 MED ORDER — TIZANIDINE HCL 4 MG PO TABS: ORAL_TABLET | ORAL | 1 refills | Status: DC

## 2017-04-26 NOTE — Assessment & Plan Note (Signed)
Per hematology 

## 2017-04-26 NOTE — Patient Instructions (Signed)
Leg Cramps Leg cramps occur when a muscle or muscles tighten and you have no control over this tightening (involuntary muscle contraction). Muscle cramps can develop in any muscle, but the most common place is in the calf muscles of the leg. Those cramps can occur during exercise or when you are at rest. Leg cramps are painful, and they may last for a few seconds to a few minutes. Cramps may return several times before they finally stop. Usually, leg cramps are not caused by a serious medical problem. In many cases, the cause is not known. Some common causes include:  Overexertion.  Overuse from repetitive motions, or doing the same thing over and over.  Remaining in a certain position for a long period of time.  Improper preparation, form, or technique while performing a sport or an activity.  Dehydration.  Injury.  Side effects of some medicines.  Abnormally low levels of the salts and ions in your blood (electrolytes), especially potassium and calcium. These levels could be low if you are taking water pills (diuretics) or if you are pregnant.  Follow these instructions at home: Watch your condition for any changes. Taking the following actions may help to lessen any discomfort that you are feeling:  Stay well-hydrated. Drink enough fluid to keep your urine clear or pale yellow.  Try massaging, stretching, and relaxing the affected muscle. Do this for several minutes at a time.  For tight or tense muscles, use a warm towel, heating pad, or hot shower water directed to the affected area.  If you are sore or have pain after a cramp, applying ice to the affected area may relieve discomfort. ? Put ice in a plastic bag. ? Place a towel between your skin and the bag. ? Leave the ice on for 20 minutes, 2-3 times per day.  Avoid strenuous exercise for several days if you have been having frequent leg cramps.  Make sure that your diet includes the essential minerals for your muscles to  work normally.  Take medicines only as directed by your health care provider.  Contact a health care provider if:  Your leg cramps get more severe or more frequent, or they do not improve over time.  Your foot becomes cold, numb, or blue. This information is not intended to replace advice given to you by your health care provider. Make sure you discuss any questions you have with your health care provider. Document Released: 02/16/2004 Document Revised: 06/16/2015 Document Reviewed: 12/16/2013 Elsevier Interactive Patient Education  2018 Elsevier Inc.  

## 2017-04-26 NOTE — Assessment & Plan Note (Signed)
Tolerating statin, encouraged heart healthy diet, avoid trans fats, minimize simple carbs and saturated fats. Increase exercise as tolerated 

## 2017-04-26 NOTE — Assessment & Plan Note (Signed)
Well controlled, no changes to meds. Encouraged heart healthy diet such as the DASH diet and exercise as tolerated.  °

## 2017-04-26 NOTE — Progress Notes (Signed)
Patient ID: Jessica Dennis, female    DOB: 12-Oct-1932  Age: 84 y.o. MRN: [id number]    Subjective:  Subjective  HPI Jessica Dennis presents for f/u and labs.  She c/o tremor in R hand but does not want to do anything about it now .  No other complaints.   Review of Systems  Constitutional: Negative for activity change, appetite change, fatigue and unexpected weight change.  Respiratory: Negative for cough and shortness of breath.   Cardiovascular: Negative for chest pain and palpitations.  Neurological: Positive for tremors. Negative for dizziness, seizures, speech difficulty, weakness, light-headedness, numbness and headaches.  Psychiatric/Behavioral: Negative for behavioral problems and dysphoric mood. The patient is not nervous/anxious.     History Past Medical History:  Diagnosis Date  . Anemia   . Angiodysplasia of ascending colon 10/25/2014  . Anxiety   . Arthritis   . Borderline diabetes   . Cancer of the skin, basal cell 09/03/2012  . Cirrhosis ([place])   . Diabetes mellitus without complication ([place])   . Diverticular disease   . GERD (gastroesophageal reflux disease)   . Hypertension   . Iron deficiency anemia due to chronic blood loss 01/19/2015  . Portal hypertensive gastropathy ([place]) 08/02/2016   ? Some GAVE also  . Thyroid disease     She has a past surgical history that includes Abdominal hysterectomy; Total hip arthroplasty (Bilateral, 1993, 2006); Appendectomy; Tonsillectomy; Colonoscopy; Leg skin lesion  biopsy / excision (12/11/14); Mohs surgery; Esophagogastroduodenoscopy; IR Paracentesis (05/25/2016); and Upper gastrointestinal endoscopy.   Her family history includes Bladder Cancer in her father; Diabetes in her mother; Heart attack in her father.She reports that she has never smoked. She has never used smokeless tobacco. She reports that she does not drink alcohol or use drugs.  Current Outpatient Medications on File Prior to Visit  Medication Sig Dispense Refill   . ACCU-CHEK FASTCLIX LANCETS MISC CHECK BLOOD SUGAR ONCE DAILY. 102 each 12  . Ascorbic Acid (VITAMIN C) 1000 MG tablet Take 1,000 mg by mouth daily.    . Calcium Carbonate-Vitamin D 600-200 MG-UNIT CAPS Take by mouth.    . Flaxseed, Linseed, (FLAX SEED OIL) 1000 MG CAPS Take by mouth.    . furosemide (LASIX) 40 MG tablet Please take 40 mg by mouth daily In the am 90 tablet 1  . glucose blood test strip Accu-Chek [employer]    . latanoprost (XALATAN) 0.005 % ophthalmic solution Place 1 drop into both eyes at bedtime.     Marland Kitchen levothyroxine (SYNTHROID, LEVOTHROID) 75 MCG tablet TAKE ONE TABLET BY MOUTH DAILY 90 tablet 2  . LORazepam (ATIVAN) 0.5 MG tablet TAKE ONE TABLET BY MOUTH DAILY AS NEEDED 90 tablet 0  . Melatonin 5 MG CAPS Take 5 mg by mouth.    . meloxicam (MOBIC) 7.5 MG tablet TAKE ONE TABLET BY MOUTH TWICE A DAY 180 tablet 0  . NOVOLOG MIX 70/30 FLEXPEN (70-30) 100 UNIT/ML FlexPen 60 Units daily. 35 in the morning and 25 at HS    . Omega-3 Fatty Acids (FISH OIL) 1000 MG CAPS Take by mouth every morning.    . pantoprazole (PROTONIX) 40 MG tablet TAKE ONE TABLET BY MOUTH DAILY 90 tablet 0  . potassium chloride (KLOR-CON) 20 MEQ packet potassium chloride ER 20 mEq tablet,extended release    . Prednicarbate 0.1 % CREA Apply topically 2 (two) times daily.     . simvastatin (ZOCOR) 20 MG tablet Take 1 tablet (20 mg total) by mouth at bedtime.  90 tablet 1  . spironolactone (ALDACTONE) 50 MG tablet TAKE TWO TABLETS BY MOUTH DAILY 60 tablet 5  . vitamin E 100 UNIT capsule Take by mouth daily.     No current facility-administered medications on file prior to visit.      Objective:  Objective  Physical Exam  Constitutional: She is oriented to person, place, and time. She appears well-developed and well-nourished.  HENT:  Head: Normocephalic and atraumatic.  Eyes: Conjunctivae and EOM are normal.  Neck: Normal range of motion. Neck supple. No JVD present. Carotid bruit is not  present. No thyromegaly present.  Cardiovascular: Normal rate, regular rhythm and normal heart sounds.  No murmur heard. Pulmonary/Chest: Effort normal and breath sounds normal. No respiratory distress. She has no wheezes. She has no rales. She exhibits no tenderness.  Musculoskeletal: She exhibits no edema.  Neurological: She is alert and oriented to person, place, and time.  Tremor R hand  Psychiatric: She has a normal mood and affect. Her behavior is normal. Judgment and thought content normal.  Nursing note and vitals reviewed.  BP (!) 152/65   Pulse 91   Temp 98.2 F (36.8 C) (Oral)   Resp 16   Ht 5\' 8"  (1.727 m)   Wt 163 lb (73.9 kg)   SpO2 100%   BMI 24.78 kg/m  Wt Readings from Last 3 Encounters:  04/26/17 163 lb (73.9 kg)  04/09/17 161 lb (73 kg)  02/19/17 171 lb (77.6 kg)     Lab Results  Component Value Date   WBC 8.2 04/09/2017   HGB 11.5 (L) 12/18/2016   HCT 32.6 (L) 04/09/2017   PLT 82 (L) 04/09/2017   GLUCOSE 297 (H) 04/26/2017   CHOL 112 04/26/2017   TRIG 95.0 04/26/2017   HDL 56.00 04/26/2017   LDLCALC 37 04/26/2017   ALT 22 04/26/2017   AST 25 04/26/2017   NA 134 (L) 04/26/2017   K 4.5 04/26/2017   CL 100 04/26/2017   CREATININE 1.14 04/26/2017   BUN 27 (H) 04/26/2017   CO2 26 04/26/2017   TSH 2.25 04/26/2017   INR 1.1 (L) 11/20/2016   HGBA1C 5.9 04/26/2017   MICROALBUR <0.7 10/26/2016    Mm Screening Breast Tomo Bilateral  Result Date: 04/18/2017 CLINICAL DATA:  Screening. EXAM: DIGITAL SCREENING BILATERAL MAMMOGRAM WITH TOMO AND CAD COMPARISON:  Previous exam(s). ACR Breast Density Category c: The breast tissue is heterogeneously dense, which may obscure small masses. FINDINGS: In the right breast, a possible asymmetry warrants further evaluation. In the left breast, no findings suspicious for malignancy. Images were processed with CAD. IMPRESSION: Further evaluation is suggested for possible asymmetry in the right breast. RECOMMENDATION:  Diagnostic mammogram and possibly ultrasound of the right breast. (Code:[id number]) The patient will be contacted regarding the findings, and additional imaging will be scheduled. BI-RADS CATEGORY  0: Incomplete. Need additional imaging evaluation and/or prior mammograms for comparison. Electronically Signed   By: Dorise Bullion III M.D   On: 04/18/2017 17:13     Assessment & Plan:  Plan  I have discontinued Octavio Graves "Trudi"'s Potassium Chloride ER. I am also having her start on tiZANidine. Additionally, I am having her maintain her Prednicarbate, latanoprost, Fish Oil, vitamin C, Calcium Carbonate-Vitamin D, Melatonin, ACCU-CHEK FASTCLIX LANCETS, NOVOLOG MIX 70/30 FLEXPEN, Flax Seed Oil, vitamin E, levothyroxine, furosemide, meloxicam, LORazepam, pantoprazole, simvastatin, glucose blood, potassium chloride, and spironolactone.  Meds ordered this encounter  Medications  . tiZANidine (ZANAFLEX) 4 MG tablet    Sig: 1 po  qhs prn leg cramps    Dispense:  15 tablet    Refill:  1    Problem List Items Addressed This Visit      Unprioritized   Essential hypertension, benign    Well controlled, no changes to meds. Encouraged heart healthy diet such as the DASH diet and exercise as tolerated.       Hyperglycemia   Relevant Orders   Hemoglobin A1c (Completed)   Hyperlipidemia LDL goal <100 - Primary    Tolerating statin, encouraged heart healthy diet, avoid trans fats, minimize simple carbs and saturated fats. Increase exercise as tolerated      Relevant Orders   Lipid panel (Completed)   Comprehensive metabolic panel (Completed)   Hypothyroidism   Relevant Orders   TSH (Completed)   Iron deficiency anemia due to chronic blood loss (Chronic)    Per hematology       Other Visit Diagnoses    Leg cramps       Relevant Medications   tiZANidine (ZANAFLEX) 4 MG tablet   Other Relevant Orders   Magnesium (Completed)   Phosphorus (Completed)   High risk medication use        Relevant Orders   Pain Mgmt, Profile 8 w/Conf, U      Follow-up: Return in about 6 months (around 10/26/2017), or if symptoms worsen or fail to improve, for annual exam, fasting.  Ann Held, DO

## 2017-04-26 NOTE — Telephone Encounter (Signed)
Spoke with patient and she will come next week to get BMET.

## 2017-04-27 LAB — PAIN MGMT, PROFILE 8 W/CONF, U
6 Acetylmorphine: NEGATIVE ng/mL (ref <10)
Abnormal Specimen Validity Test:: 1.007
Alcohol Metabolites: NEGATIVE ng/mL (ref <500)
Amphetamines: NEGATIVE ng/mL (ref <500)
Benzodiazepines: NEGATIVE ng/mL (ref <100)
Buprenorphine, Urine: NEGATIVE ng/mL (ref <5)
Cocaine Metabolite: NEGATIVE ng/mL (ref <150)
Creatinine: 16.4 mg/dL — ABNORMAL LOW
MDMA: NEGATIVE ng/mL (ref <500)
Marijuana Metabolite: NEGATIVE ng/mL (ref <20)
Opiates: NEGATIVE ng/mL (ref <100)
Oxidant: NEGATIVE ug/mL (ref <200)
Oxycodone: NEGATIVE ng/mL (ref <100)
pH: 5.54 (ref 4.5–9.0)

## 2017-04-29 ENCOUNTER — Other Ambulatory Visit (INDEPENDENT_AMBULATORY_CARE_PROVIDER_SITE_OTHER): Payer: Medicare HMO

## 2017-04-29 DIAGNOSIS — K7031 Alcoholic cirrhosis of liver with ascites: Secondary | ICD-10-CM | POA: Diagnosis not present

## 2017-04-29 DIAGNOSIS — R69 Illness, unspecified: Secondary | ICD-10-CM | POA: Diagnosis not present

## 2017-04-29 LAB — BASIC METABOLIC PANEL
BUN: 27 mg/dL — ABNORMAL HIGH (ref 6–23)
CO2: 26 mEq/L (ref 19–32)
Calcium: 9 mg/dL (ref 8.4–10.5)
Chloride: 99 mEq/L (ref 96–112)
Creatinine, Ser: 1.42 mg/dL — ABNORMAL HIGH (ref 0.40–1.20)
GFR: 37.61 mL/min — ABNORMAL LOW (ref >60.00)
Glucose, Bld: 178 mg/dL — ABNORMAL HIGH (ref 70–99)
Potassium: 4.8 mEq/L (ref 3.5–5.1)
Sodium: 135 mEq/L (ref 135–145)

## 2017-04-30 ENCOUNTER — Other Ambulatory Visit: Payer: Self-pay

## 2017-04-30 DIAGNOSIS — K7460 Unspecified cirrhosis of liver: Secondary | ICD-10-CM

## 2017-04-30 MED ORDER — SPIRONOLACTONE 50 MG PO TABS: 50.0000 mg | ORAL_TABLET | Freq: Every day | ORAL | 5 refills | Status: DC

## 2017-04-30 MED ORDER — FUROSEMIDE 40 MG PO TABS: 20.0000 mg | ORAL_TABLET | Freq: Every day | ORAL | 1 refills | Status: DC

## 2017-04-30 NOTE — Progress Notes (Signed)
Kidney function is not normal Could be from effect of fluid pills Also blood sugar high though better than 4 d ago  1) change furosemide to 1/2 tablet daily = 20 mg daily in AM 2) change spironolactone to 50 mg or 1 tablet in AM 3) Repeat BMET in 2 weeks 4) weight in office in 2 weeks also 5) Is she taking meloxicam (on list) or other NSAID's? Can damage kidneys and liver so would avoid if possible  She should weigh daily and if rising fast (from fluid) let us know

## 2017-05-01 ENCOUNTER — Encounter: Payer: Self-pay | Admitting: *Deleted

## 2017-05-08 ENCOUNTER — Other Ambulatory Visit (INDEPENDENT_AMBULATORY_CARE_PROVIDER_SITE_OTHER): Payer: Medicare HMO

## 2017-05-08 DIAGNOSIS — K7460 Unspecified cirrhosis of liver: Secondary | ICD-10-CM

## 2017-05-08 LAB — BASIC METABOLIC PANEL
BUN: 22 mg/dL (ref 6–23)
CO2: 28 mEq/L (ref 19–32)
Calcium: 9.1 mg/dL (ref 8.4–10.5)
Chloride: 102 mEq/L (ref 96–112)
Creatinine, Ser: 0.99 mg/dL (ref 0.40–1.20)
GFR: 57.02 mL/min — ABNORMAL LOW (ref >60.00)
Glucose, Bld: 99 mg/dL (ref 70–99)
Potassium: 4.8 mEq/L (ref 3.5–5.1)
Sodium: 136 mEq/L (ref 135–145)

## 2017-05-09 NOTE — Progress Notes (Signed)
Kidney function back to NL Glucose great  Ask how she is doing with swelling/edema and if weight stable or going up re: fluid status

## 2017-05-09 NOTE — Progress Notes (Signed)
OK  Let's set her up for a next available f/u appt also

## 2017-05-21 ENCOUNTER — Inpatient Hospital Stay: Payer: Medicare HMO

## 2017-05-21 ENCOUNTER — Inpatient Hospital Stay: Payer: Medicare HMO | Attending: Family | Admitting: Family

## 2017-05-21 VITALS — BP 126/54 | HR 85 | Temp 97.9°F | Resp 18 | Wt 168.5 lb

## 2017-05-21 DIAGNOSIS — D500 Iron deficiency anemia secondary to blood loss (chronic): Secondary | ICD-10-CM | POA: Diagnosis not present

## 2017-05-21 DIAGNOSIS — R161 Splenomegaly, not elsewhere classified: Secondary | ICD-10-CM | POA: Diagnosis not present

## 2017-05-21 DIAGNOSIS — D696 Thrombocytopenia, unspecified: Secondary | ICD-10-CM

## 2017-05-21 DIAGNOSIS — K7460 Unspecified cirrhosis of liver: Secondary | ICD-10-CM | POA: Diagnosis not present

## 2017-05-21 DIAGNOSIS — R5383 Other fatigue: Secondary | ICD-10-CM | POA: Diagnosis not present

## 2017-05-21 DIAGNOSIS — Z79899 Other long term (current) drug therapy: Secondary | ICD-10-CM | POA: Insufficient documentation

## 2017-05-21 DIAGNOSIS — R531 Weakness: Secondary | ICD-10-CM | POA: Diagnosis not present

## 2017-05-21 DIAGNOSIS — D6959 Other secondary thrombocytopenia: Secondary | ICD-10-CM | POA: Insufficient documentation

## 2017-05-21 DIAGNOSIS — K922 Gastrointestinal hemorrhage, unspecified: Secondary | ICD-10-CM | POA: Diagnosis not present

## 2017-05-21 LAB — CMP (CANCER CENTER ONLY)
ALT: 20 U/L (ref 0–55)
AST: 28 U/L (ref 5–34)
Albumin: 3.7 g/dL (ref 3.5–5.0)
Alkaline Phosphatase: 98 U/L (ref 40–150)
Anion gap: 8 (ref 3–11)
BUN: 23 mg/dL (ref 7–26)
CO2: 23 mmol/L (ref 22–29)
Calcium: 9.5 mg/dL (ref 8.4–10.4)
Chloride: 108 mmol/L (ref 98–109)
Creatinine: 1.07 mg/dL (ref 0.60–1.10)
GFR, Est AFR Am: 54 mL/min — ABNORMAL LOW (ref >60)
GFR, Estimated: 47 mL/min — ABNORMAL LOW (ref >60)
Glucose, Bld: 169 mg/dL — ABNORMAL HIGH (ref 70–140)
Potassium: 4.7 mmol/L (ref 3.5–5.1)
Sodium: 139 mmol/L (ref 136–145)
Total Bilirubin: 1 mg/dL (ref 0.2–1.2)
Total Protein: 6.3 g/dL — ABNORMAL LOW (ref 6.4–8.3)

## 2017-05-21 LAB — CBC WITH DIFFERENTIAL (CANCER CENTER ONLY)
Basophils Absolute: 0 10*3/uL (ref 0.0–0.1)
Basophils Relative: 0 %
Eosinophils Absolute: 0.1 10*3/uL (ref 0.0–0.5)
Eosinophils Relative: 2 %
HCT: 28.6 % — ABNORMAL LOW (ref 34.8–46.6)
Hemoglobin: 9.1 g/dL — ABNORMAL LOW (ref 11.6–15.9)
Lymphocytes Relative: 13 %
Lymphs Abs: 0.6 10*3/uL — ABNORMAL LOW (ref 0.9–3.3)
MCH: 28.3 pg (ref 26.0–34.0)
MCHC: 31.8 g/dL — ABNORMAL LOW (ref 32.0–36.0)
MCV: 89.1 fL (ref 81.0–101.0)
Monocytes Absolute: 0.4 10*3/uL (ref 0.1–0.9)
Monocytes Relative: 7 %
Neutro Abs: 3.6 10*3/uL (ref 1.5–6.5)
Neutrophils Relative %: 78 %
Platelet Count: 67 10*3/uL — ABNORMAL LOW (ref 145–400)
RBC: 3.21 MIL/uL — ABNORMAL LOW (ref 3.70–5.32)
RDW: 13.7 % (ref 11.1–15.7)
WBC Count: 4.7 10*3/uL (ref 3.9–10.0)

## 2017-05-21 LAB — FERRITIN: Ferritin: 14 ng/mL (ref 9–269)

## 2017-05-21 LAB — IRON AND TIBC
Iron: 31 ug/dL — ABNORMAL LOW (ref 41–142)
Saturation Ratios: 9 % — ABNORMAL LOW (ref 21–57)
TIBC: 350 ug/dL (ref 236–444)
UIBC: 319 ug/dL

## 2017-05-21 LAB — SAMPLE TO BLOOD BANK

## 2017-05-21 NOTE — Progress Notes (Signed)
Hematology and Oncology Follow Up Visit  Jessica Dennis [id number] 1932/05/03 84 y.o. 05/21/2017   Principle Diagnosis:  Chronic thrombocytopenia secondary to cirrhosis and splenomegaly Iron deficiency anemia secondary to GI bleed  Current Therapy:   IV iron as indicated - last received in Jan/Feb x 2 Red blood cell transfusion as needed for variceal bleeding - last received February 2019   Interim History:  Jessica Dennis is here today for follow-up. She is symptomatic with fatigue and weakness at times.  Hgb is 9.1, MCV 89, platelet count 67. She denies any bleeding, no bruising or petechiae. No fever, chills, n/v, cough, rash, dizziness, SOB, chest pain, palpitations, abdominal pain or changes in bowel or bladder habits.  No lymphadenopathy noted on exam.  She has puffiness in her feet and ankles that comes and goes. No numbness or tingling in her extremities. She has chronic issues with her right shoulder and her ROM, lifting of her arm, is a little limited.  She has maintained a good appetite and is staying well hydrated. Her weight is stable.   ECOG Performance Status: 1 - Symptomatic but completely ambulatory  Medications:  Allergies as of 05/21/2017      Reactions   Other       Medication List        Accurate as of 05/21/17 12:17 PM. Always use your most recent med list.          ACCU-CHEK FASTCLIX LANCETS Misc CHECK BLOOD SUGAR ONCE DAILY.   Calcium Carbonate-Vitamin D 600-200 MG-UNIT Caps Take by mouth.   Fish Oil 1000 MG Caps Take by mouth every morning.   Flax Seed Oil 1000 MG Caps Take by mouth.   furosemide 40 MG tablet Commonly known as:  LASIX Take 0.5 tablets (20 mg total) by mouth daily. Please take 40 mg by mouth daily In the am   glucose blood test strip Accu-Chek Fastclix Lancet Drum   latanoprost 0.005 % ophthalmic solution Commonly known as:  XALATAN Place 1 drop into both eyes at bedtime.   levothyroxine 75 MCG tablet Commonly known as:   SYNTHROID, LEVOTHROID TAKE ONE TABLET BY MOUTH DAILY   LORazepam 0.5 MG tablet Commonly known as:  ATIVAN TAKE ONE TABLET BY MOUTH DAILY AS NEEDED   Melatonin 5 MG Caps Take 5 mg by mouth.   meloxicam 7.5 MG tablet Commonly known as:  MOBIC TAKE ONE TABLET BY MOUTH TWICE A DAY   NOVOLOG MIX 70/30 FLEXPEN (70-30) 100 UNIT/ML FlexPen Generic drug:  insulin aspart protamine - aspart 60 Units daily. 35 in the morning and 25 at HS   pantoprazole 40 MG tablet Commonly known as:  PROTONIX TAKE ONE TABLET BY MOUTH DAILY   potassium chloride 20 MEQ packet Commonly known as:  KLOR-CON potassium chloride ER 20 mEq tablet,extended release   Prednicarbate 0.1 % Crea Apply topically 2 (two) times daily.   simvastatin 20 MG tablet Commonly known as:  ZOCOR Take 1 tablet (20 mg total) by mouth at bedtime.   spironolactone 50 MG tablet Commonly known as:  ALDACTONE Take 1 tablet (50 mg total) by mouth daily.   tiZANidine 4 MG tablet Commonly known as:  ZANAFLEX 1 po qhs prn leg cramps   vitamin C 1000 MG tablet Take 1,000 mg by mouth daily.   vitamin E 100 UNIT capsule Take by mouth daily.       Allergies:  Allergies  Allergen Reactions  . Other     Past Medical History, Surgical history,  Social history, and Family History were reviewed and updated.  Review of Systems: All other 10 point review of systems is negative.   Physical Exam:  weight is 168 lb 8 oz (76.4 kg). Her oral temperature is 97.9 F (36.6 C). Her blood pressure is 126/54 (abnormal) and her pulse is 85. Her respiration is 18 and oxygen saturation is 97%.   Wt Readings from Last 3 Encounters:  05/21/17 168 lb 8 oz (76.4 kg)  04/26/17 163 lb (73.9 kg)  04/09/17 161 lb (73 kg)    Ocular: Sclerae unicteric, pupils equal, round and reactive to light Ear-nose-throat: Oropharynx clear, dentition fair Lymphatic: No cervical, supraclavicular or axillary adenopathy Lungs no rales or rhonchi, good  excursion bilaterally Heart regular rate and rhythm, no murmur appreciated Abd soft, nontender, positive bowel sounds, no liver or spleen tip palpated on exam, no fluid wave  MSK no focal spinal tenderness, no joint edema Neuro: non-focal, well-oriented, appropriate affect Breasts: Deferred   Lab Results  Component Value Date   WBC 4.7 05/21/2017   HGB 9.1 (L) 05/21/2017   HCT 28.6 (L) 05/21/2017   MCV 89.1 05/21/2017   PLT 67 (L) 05/21/2017   Lab Results  Component Value Date   FERRITIN 88 04/09/2017   IRON 77 04/09/2017   TIBC 243 04/09/2017   UIBC 166 04/09/2017   IRONPCTSAT 32 04/09/2017   Lab Results  Component Value Date   RBC 3.21 (L) 05/21/2017   No results found for: KPAFRELGTCHN, LAMBDASER, KAPLAMBRATIO No results found for: IGGSERUM, IGA, IGMSERUM No results found for: Odetta Pink, SPEI   Chemistry      Component Value Date/Time   NA 136 05/08/2017 1355   NA 138 12/18/2016 1314   NA 139 03/05/2016 1139   K 4.8 05/08/2017 1355   K 4.3 12/18/2016 1314   K 3.6 03/05/2016 1139   CL 102 05/08/2017 1355   CL 101 12/18/2016 1314   CO2 28 05/08/2017 1355   CO2 28 12/18/2016 1314   CO2 26 03/05/2016 1139   BUN 22 05/08/2017 1355   BUN 33 (H) 12/18/2016 1314   BUN 14.3 03/05/2016 1139   CREATININE 0.99 05/08/2017 1355   CREATININE 1.35 (H) 04/09/2017 1121   CREATININE 1.3 (H) 12/18/2016 1314   CREATININE 0.7 03/05/2016 1139      Component Value Date/Time   CALCIUM 9.1 05/08/2017 1355   CALCIUM 10.0 12/18/2016 1314   CALCIUM 9.3 03/05/2016 1139   ALKPHOS 94 04/26/2017 1158   ALKPHOS 94 (H) 12/18/2016 1314   ALKPHOS 127 03/05/2016 1139   AST 25 04/26/2017 1158   AST 24 04/09/2017 1121   AST 29 03/05/2016 1139   ALT 22 04/26/2017 1158   ALT 23 04/09/2017 1121   ALT 26 12/18/2016 1314   ALT 22 03/05/2016 1139   BILITOT 1.3 (H) 04/26/2017 1158   BILITOT 1.1 04/09/2017 1121   BILITOT 1.75 (H)  03/05/2016 1139      Impression and Plan: Jessica Dennis is a very pleasant 84 yo caucasian female with chronic thrombocytopenia and iron deficiency anemia secondary to GI blood loss. Platelet count is 67 and Hgb 9.1. She is symptomatic with fatigue.  We will see what her iron studies show and bring her back in for infusion if needed.  We will plan to see her back in another 6 weeks for follow-up.  She will contact our office with any questions or concerns. We can certainly see her sooner if  need be.  [name], NP 4/30/201912:17 PM

## 2017-05-24 DIAGNOSIS — M5136 Other intervertebral disc degeneration, lumbar region: Secondary | ICD-10-CM | POA: Diagnosis not present

## 2017-05-24 DIAGNOSIS — M25552 Pain in left hip: Secondary | ICD-10-CM | POA: Diagnosis not present

## 2017-05-24 DIAGNOSIS — S39012A Strain of muscle, fascia and tendon of lower back, initial encounter: Secondary | ICD-10-CM | POA: Diagnosis not present

## 2017-05-25 ENCOUNTER — Other Ambulatory Visit: Payer: Self-pay | Admitting: Family Medicine

## 2017-05-25 DIAGNOSIS — I10 Essential (primary) hypertension: Secondary | ICD-10-CM

## 2017-05-28 ENCOUNTER — Inpatient Hospital Stay: Payer: Medicare HMO | Attending: Family

## 2017-05-28 VITALS — BP 126/57 | HR 85 | Temp 98.1°F | Resp 20

## 2017-05-28 DIAGNOSIS — K7460 Unspecified cirrhosis of liver: Secondary | ICD-10-CM | POA: Insufficient documentation

## 2017-05-28 DIAGNOSIS — K5520 Angiodysplasia of colon without hemorrhage: Secondary | ICD-10-CM

## 2017-05-28 DIAGNOSIS — R195 Other fecal abnormalities: Secondary | ICD-10-CM

## 2017-05-28 DIAGNOSIS — D500 Iron deficiency anemia secondary to blood loss (chronic): Secondary | ICD-10-CM | POA: Diagnosis present

## 2017-05-28 DIAGNOSIS — K922 Gastrointestinal hemorrhage, unspecified: Secondary | ICD-10-CM | POA: Diagnosis not present

## 2017-05-28 DIAGNOSIS — I864 Gastric varices: Secondary | ICD-10-CM

## 2017-05-28 DIAGNOSIS — D696 Thrombocytopenia, unspecified: Secondary | ICD-10-CM | POA: Diagnosis not present

## 2017-05-28 DIAGNOSIS — K219 Gastro-esophageal reflux disease without esophagitis: Secondary | ICD-10-CM

## 2017-05-28 MED ORDER — SODIUM CHLORIDE 0.9 % IV SOLN
Freq: Once | INTRAVENOUS | Status: AC
  Administered 2017-05-28: 14:00:00 via INTRAVENOUS

## 2017-05-28 MED ORDER — SODIUM CHLORIDE 0.9 % IV SOLN
510.0000 mg | Freq: Once | INTRAVENOUS | Status: AC
  Administered 2017-05-28: 510 mg via INTRAVENOUS
  Filled 2017-05-28: qty 17

## 2017-05-28 NOTE — Patient Instructions (Signed)

## 2017-06-04 ENCOUNTER — Inpatient Hospital Stay: Payer: Medicare HMO

## 2017-06-04 ENCOUNTER — Other Ambulatory Visit: Payer: Self-pay

## 2017-06-04 VITALS — BP 110/46 | HR 70 | Temp 98.2°F | Resp 18

## 2017-06-04 DIAGNOSIS — R195 Other fecal abnormalities: Secondary | ICD-10-CM

## 2017-06-04 DIAGNOSIS — K219 Gastro-esophageal reflux disease without esophagitis: Secondary | ICD-10-CM

## 2017-06-04 DIAGNOSIS — D696 Thrombocytopenia, unspecified: Secondary | ICD-10-CM | POA: Diagnosis not present

## 2017-06-04 DIAGNOSIS — K5520 Angiodysplasia of colon without hemorrhage: Secondary | ICD-10-CM

## 2017-06-04 DIAGNOSIS — D500 Iron deficiency anemia secondary to blood loss (chronic): Secondary | ICD-10-CM | POA: Diagnosis not present

## 2017-06-04 DIAGNOSIS — K7460 Unspecified cirrhosis of liver: Secondary | ICD-10-CM | POA: Diagnosis not present

## 2017-06-04 DIAGNOSIS — K922 Gastrointestinal hemorrhage, unspecified: Secondary | ICD-10-CM | POA: Diagnosis not present

## 2017-06-04 MED ORDER — SODIUM CHLORIDE 0.9 % IV SOLN
INTRAVENOUS | Status: DC
  Administered 2017-06-04: 14:00:00 via INTRAVENOUS

## 2017-06-04 MED ORDER — SODIUM CHLORIDE 0.9 % IV SOLN
510.0000 mg | Freq: Once | INTRAVENOUS | Status: AC
  Administered 2017-06-04: 510 mg via INTRAVENOUS
  Filled 2017-06-04: qty 17

## 2017-06-04 NOTE — Patient Instructions (Signed)

## 2017-06-06 ENCOUNTER — Other Ambulatory Visit: Payer: Self-pay | Admitting: Family Medicine

## 2017-06-06 DIAGNOSIS — F411 Generalized anxiety disorder: Secondary | ICD-10-CM

## 2017-06-10 NOTE — Telephone Encounter (Signed)
Requesting: ATIVAN 0.5 MG Contract: 04/26/17 UDS: 04/26/17 Low risk Last OV:  04/26/17 Next OV:  06/10/17 Last Refill: 12/25/16 #90   Please advise

## 2017-06-11 ENCOUNTER — Encounter: Payer: Self-pay | Admitting: Family Medicine

## 2017-06-11 ENCOUNTER — Ambulatory Visit (INDEPENDENT_AMBULATORY_CARE_PROVIDER_SITE_OTHER): Payer: Medicare HMO | Admitting: Family Medicine

## 2017-06-11 VITALS — BP 116/62 | HR 110 | Temp 98.0°F | Resp 16 | Ht 67.0 in | Wt 168.0 lb

## 2017-06-11 DIAGNOSIS — H6123 Impacted cerumen, bilateral: Secondary | ICD-10-CM | POA: Diagnosis not present

## 2017-06-11 NOTE — Progress Notes (Signed)
Subjective:  I acted as a Education administrator for [employer]. Yancey Flemings, [place]   Patient ID: Jessica Dennis, female    DOB: February 10, 1932, 84 y.o.   MRN: [id number]  Chief Complaint  Patient presents with  . Hearing Problem    left ear    HPI  Patient is in today for hearing issues with left ear.  She thinks her ears need to be irrigated.  No other complaints.   Patient Care Team: Carollee Herter, Alferd Apa, DO as PCP - General (Family Medicine) Tora Kindred Marily Lente, MD as Referring Physician (Ophthalmology) Sheryn Bison, MD as Referring Physician (Dermatology) Gatha Mayer, MD as Consulting Physician (Gastroenterology) Volanda Napoleon, MD as Consulting Physician (Oncology) Janan Ridge, MD as Consulting Physician (Dermatology) Paralee Cancel, MD as Consulting Physician (Orthopedic Surgery) Jacelyn Pi, MD as Consulting Physician (Endocrinology)   Past Medical History:  Diagnosis Date  . Anemia   . Angiodysplasia of ascending colon 10/25/2014  . Anxiety   . Arthritis   . Borderline diabetes   . Cancer of the skin, basal cell 09/03/2012  . Cirrhosis ([place])   . Diabetes mellitus without complication ([place])   . Diverticular disease   . GERD (gastroesophageal reflux disease)   . Hypertension   . Iron deficiency anemia due to chronic blood loss 01/19/2015  . Portal hypertensive gastropathy ([place]) 08/02/2016   ? Some GAVE also  . Thyroid disease     Past Surgical History:  Procedure Laterality Date  . ABDOMINAL HYSTERECTOMY    . APPENDECTOMY    . COLONOSCOPY    . ESOPHAGOGASTRODUODENOSCOPY    . IR PARACENTESIS  05/25/2016  . LEG SKIN LESION  BIOPSY / EXCISION  12/11/14  . MOHS SURGERY     ankle  . TONSILLECTOMY    . TOTAL HIP ARTHROPLASTY Bilateral 1993, 2006  . UPPER GASTROINTESTINAL ENDOSCOPY      Family History  Problem Relation Age of Onset  . Bladder Cancer Father   . Heart attack Father   . Diabetes Mother   . Colon cancer Neg Hx   . Colon polyps Neg Hx   . Esophageal  cancer Neg Hx   . Rectal cancer Neg Hx   . Stomach cancer Neg Hx     Social History   Socioeconomic History  . Marital status: Divorced    Spouse name: Not on file  . Number of children: 2  . Years of education: Not on file  . Highest education level: Not on file  Occupational History  . Occupation: retired  Scientific laboratory technician  . Financial resource strain: Not on file  . Food insecurity:    Worry: Not on file    Inability: Not on file  . Transportation needs:    Medical: Not on file    Non-medical: Not on file  Tobacco Use  . Smoking status: Never Smoker  . Smokeless tobacco: Never Used  . Tobacco comment: NEVER USED TOBACCO  Substance and Sexual Activity  . Alcohol use: No    Alcohol/week: 1.2 oz    Types: 2 Standard drinks or equivalent per week  . Drug use: No  . Sexual activity: Never  Lifestyle  . Physical activity:    Days per week: Not on file    Minutes per session: Not on file  . Stress: Not on file  Relationships  . Social connections:    Talks on phone: Not on file    Gets together: Not on file    Attends religious service:  Not on file    Active member of club or organization: Not on file    Attends meetings of clubs or organizations: Not on file    Relationship status: Not on file  . Intimate partner violence:    Fear of current or ex partner: Not on file    Emotionally abused: Not on file    Physically abused: Not on file    Forced sexual activity: Not on file  Other Topics Concern  . Not on file  Social History Narrative   The patient is divorced. She is originally from [place]. She has 2 sons. 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   08/10/2014    Outpatient Medications Prior to Visit  Medication Sig Dispense Refill  . ACCU-CHEK FASTCLIX LANCETS MISC CHECK BLOOD SUGAR ONCE DAILY. 102 each 12  . Ascorbic Acid (VITAMIN C) 1000 MG tablet Take 1,000 mg by mouth daily.    . Calcium Carbonate-Vitamin D 600-200 MG-UNIT  CAPS Take by mouth.    . Flaxseed, Linseed, (FLAX SEED OIL) 1000 MG CAPS Take by mouth.    . furosemide (LASIX) 40 MG tablet Take 0.5 tablets (20 mg total) by mouth daily. Please take 40 mg by mouth daily In the am 90 tablet 1  . glucose blood test strip Accu-Chek [employer]    . latanoprost (XALATAN) 0.005 % ophthalmic solution Place 1 drop into both eyes at bedtime.     Marland Kitchen levothyroxine (SYNTHROID, LEVOTHROID) 75 MCG tablet TAKE ONE TABLET BY MOUTH DAILY 90 tablet 1  . LORazepam (ATIVAN) 0.5 MG tablet TAKE ONE TABLET BY MOUTH DAILY AS NEEDED 90 tablet 0  . Melatonin 5 MG CAPS Take 5 mg by mouth.    . meloxicam (MOBIC) 7.5 MG tablet TAKE ONE TABLET BY MOUTH TWICE A DAY 180 tablet 0  . NOVOLOG MIX 70/30 FLEXPEN (70-30) 100 UNIT/ML FlexPen 60 Units daily. 35 in the morning and 25 at HS    . Omega-3 Fatty Acids (FISH OIL) 1000 MG CAPS Take by mouth every morning.    . pantoprazole (PROTONIX) 40 MG tablet TAKE ONE TABLET BY MOUTH DAILY 90 tablet 1  . Potassium Chloride ER 20 MEQ TBCR TAKE ONE TABLET BY MOUTH DAILY 90 tablet 0  . Prednicarbate 0.1 % CREA Apply topically 2 (two) times daily.     . simvastatin (ZOCOR) 20 MG tablet Take 1 tablet (20 mg total) by mouth at bedtime. 90 tablet 1  . spironolactone (ALDACTONE) 50 MG tablet Take 1 tablet (50 mg total) by mouth daily. 60 tablet 5  . tiZANidine (ZANAFLEX) 4 MG tablet 1 po qhs prn leg cramps 15 tablet 1  . vitamin E 100 UNIT capsule Take by mouth daily.     No facility-administered medications prior to visit.     No Known Allergies  Review of Systems  Constitutional: Negative for chills, fever and malaise/fatigue.  HENT: Positive for hearing loss. Negative for congestion.   Eyes: Negative for discharge.  Respiratory: Negative for cough, sputum production and shortness of breath.   Cardiovascular: Negative for chest pain, palpitations and leg swelling.  Gastrointestinal: Negative for abdominal pain, blood in stool, constipation,  diarrhea, heartburn, nausea and vomiting.  Genitourinary: Negative for dysuria, frequency, hematuria and urgency.  Musculoskeletal: Negative for back pain, falls and myalgias.  Skin: Negative for rash.  Neurological: Negative for dizziness, sensory change, loss of consciousness, weakness and headaches.  Endo/Heme/Allergies: Negative for environmental allergies. Does not bruise/bleed easily.  Psychiatric/Behavioral: Negative for depression and suicidal ideas. The patient is not nervous/anxious and does not have insomnia.        Objective:    Physical Exam  Constitutional: She is oriented to person, place, and time. She appears well-developed and well-nourished.  HENT:  Head: Normocephalic and atraumatic.  Both ears ---+ cerumen impaction Unable to remove with hoop Irrigated b/l--  Tmi, no errythema cerumen removed b/l   Eyes: Conjunctivae and EOM are normal.  Neck: Normal range of motion. Neck supple. No JVD present. Carotid bruit is not present. No thyromegaly present.  Cardiovascular: Normal rate, regular rhythm and normal heart sounds.  No murmur heard. Pulmonary/Chest: Effort normal and breath sounds normal. No respiratory distress. She has no wheezes. She has no rales. She exhibits no tenderness.  Musculoskeletal: She exhibits no edema.  Neurological: She is alert and oriented to person, place, and time.  Psychiatric: She has a normal mood and affect.  Nursing note and vitals reviewed.   BP 116/62 (BP Location: Left Arm, Patient Position: Sitting, Cuff Size: Normal)   Pulse (!) 110   Temp 98 F (36.7 C) (Oral)   Resp 16   Ht 5' 7"  (1.702 m)   Wt 168 lb (76.2 kg)   SpO2 99%   BMI 26.31 kg/m  Wt Readings from Last 3 Encounters:  06/11/17 168 lb (76.2 kg)  05/21/17 168 lb 8 oz (76.4 kg)  04/26/17 163 lb (73.9 kg)   BP Readings from Last 3 Encounters:  06/11/17 116/62  06/04/17 (!) 110/46  05/28/17 (!) 126/57     Immunization History  Administered Date(s)  Administered  . Hepatitis B, adult 05/12/2014  . Hepatitis B, ped/adol 08/10/2014  . Influenza Split 11/02/2009, 10/15/2010  . Influenza, High Dose Seasonal PF 10/13/2014  . Influenza, Seasonal, Injecte, Preservative Fre 10/13/2014  . Influenza,inj,quad, With Preservative 11/22/2016  . Influenza-Unspecified 11/25/2013, 10/06/2015  . Pneumococcal Conjugate-13 05/17/2014  . Pneumococcal Polysaccharide-23 10/06/2015  . Tdap 10/02/2011, 11/06/2016  . Zoster 01/22/2005    Health Maintenance  Topic Date Due  . FOOT EXAM  05/12/2015  . COLONOSCOPY  10/25/2015  . OPHTHALMOLOGY EXAM  11/22/2016  . INFLUENZA VACCINE  08/22/2017  . HEMOGLOBIN A1C  10/26/2017  . URINE MICROALBUMIN  10/26/2017  . MAMMOGRAM  04/19/2018  . TETANUS/TDAP  11/07/2026  . DEXA SCAN  Completed  . PNA vac Low Risk Adult  Addressed    Lab Results  Component Value Date   WBC 4.7 05/21/2017   HGB 9.1 (L) 05/21/2017   HCT 28.6 (L) 05/21/2017   PLT 67 (L) 05/21/2017   GLUCOSE 169 (H) 05/21/2017   CHOL 112 04/26/2017   TRIG 95.0 04/26/2017   HDL 56.00 04/26/2017   LDLCALC 37 04/26/2017   ALT 20 05/21/2017   AST 28 05/21/2017   NA 139 05/21/2017   K 4.7 05/21/2017   CL 108 05/21/2017   CREATININE 1.07 05/21/2017   BUN 23 05/21/2017   CO2 23 05/21/2017   TSH 2.25 04/26/2017   INR 1.1 (L) 11/20/2016   HGBA1C 5.9 04/26/2017   MICROALBUR <0.7 10/26/2016    Lab Results  Component Value Date   TSH 2.25 04/26/2017   Lab Results  Component Value Date   WBC 4.7 05/21/2017   HGB 9.1 (L) 05/21/2017   HCT 28.6 (L) 05/21/2017   MCV 89.1 05/21/2017   PLT 67 (L) 05/21/2017   Lab Results  Component Value Date   NA 139 05/21/2017   K 4.7 05/21/2017  CHLORIDE 104 03/05/2016   CO2 23 05/21/2017   GLUCOSE 169 (H) 05/21/2017   BUN 23 05/21/2017   CREATININE 1.07 05/21/2017   BILITOT 1.0 05/21/2017   ALKPHOS 98 05/21/2017   AST 28 05/21/2017   ALT 20 05/21/2017   PROT 6.3 (L) 05/21/2017   ALBUMIN 3.7  05/21/2017   CALCIUM 9.5 05/21/2017   ANIONGAP 8 05/21/2017   EGFR 76 (L) 03/05/2016   GFR 57.02 (L) 05/08/2017   Lab Results  Component Value Date   CHOL 112 04/26/2017   Lab Results  Component Value Date   HDL 56.00 04/26/2017   Lab Results  Component Value Date   LDLCALC 37 04/26/2017   Lab Results  Component Value Date   TRIG 95.0 04/26/2017   Lab Results  Component Value Date   CHOLHDL 2 04/26/2017   Lab Results  Component Value Date   HGBA1C 5.9 04/26/2017         Assessment & Plan:   Problem List Items Addressed This Visit    None    Visit Diagnoses    Bilateral impacted cerumen    -  Primary    cerumen removed b/l-- pt tolerated procedure well  rto prn  I am having Octavio Graves "Trudi" maintain her Prednicarbate, latanoprost, Fish Oil, vitamin C, Calcium Carbonate-Vitamin D, Melatonin, ACCU-CHEK FASTCLIX LANCETS, NOVOLOG MIX 70/30 FLEXPEN, Flax Seed Oil, vitamin E, meloxicam, simvastatin, glucose blood, pantoprazole, tiZANidine, spironolactone, furosemide, levothyroxine, Potassium Chloride ER, and LORazepam.  No orders of the defined types were placed in this encounter.   CMA served as Education administrator during this visit. History, Physical and Plan performed by medical provider. Documentation and orders reviewed and attested to.  Ann Held, DO

## 2017-06-11 NOTE — Patient Instructions (Signed)
Earwax Buildup, Adult The ears produce a substance called earwax that helps keep bacteria out of the ear and protects the skin in the ear canal. Occasionally, earwax can build up in the ear and cause discomfort or hearing loss. What increases the risk? This condition is more likely to develop in people who:  Are female.  Are elderly.  Naturally produce more earwax.  Clean their ears often with cotton swabs.  Use earplugs often.  Use in-ear headphones often.  Wear hearing aids.  Have narrow ear canals.  Have earwax that is overly thick or sticky.  Have eczema.  Are dehydrated.  Have excess hair in the ear canal.  What are the signs or symptoms? Symptoms of this condition include:  Reduced or muffled hearing.  A feeling of fullness in the ear or feeling that the ear is plugged.  Fluid coming from the ear.  Ear pain.  Ear itch.  Ringing in the ear.  Coughing.  An obvious piece of earwax that can be seen inside the ear canal.  How is this diagnosed? This condition may be diagnosed based on:  Your symptoms.  Your medical history.  An ear exam. During the exam, your health care provider will look into your ear with an instrument called an otoscope.  You may have tests, including a hearing test. How is this treated? This condition may be treated by:  Using ear drops to soften the earwax.  Having the earwax removed by a health care provider. The health care provider may: ? Flush the ear with water. ? Use an instrument that has a loop on the end (curette). ? Use a suction device.  Surgery to remove the wax buildup. This may be done in severe cases.  Follow these instructions at home:  Take over-the-counter and prescription medicines only as told by your health care provider.  Do not put any objects, including cotton swabs, into your ear. You can clean the opening of your ear canal with a washcloth or facial tissue.  Follow instructions from your health  care provider about cleaning your ears. Do not over-clean your ears.  Drink enough fluid to keep your urine clear or pale yellow. This will help to thin the earwax.  Keep all follow-up visits as told by your health care provider. If earwax builds up in your ears often or if you use hearing aids, consider seeing your health care provider for routine, preventive ear cleanings. Ask your health care provider how often you should schedule your cleanings.  If you have hearing aids, clean them according to instructions from the manufacturer and your health care provider. Contact a health care provider if:  You have ear pain.  You develop a fever.  You have blood, pus, or other fluid coming from your ear.  You have hearing loss.  You have ringing in your ears that does not go away.  Your symptoms do not improve with treatment.  You feel like the room is spinning (vertigo). Summary  Earwax can build up in the ear and cause discomfort or hearing loss.  The most common symptoms of this condition include reduced or muffled hearing and a feeling of fullness in the ear or feeling that the ear is plugged.  This condition may be diagnosed based on your symptoms, your medical history, and an ear exam.  This condition may be treated by using ear drops to soften the earwax or by having the earwax removed by a health care provider.  Do   not put any objects, including cotton swabs, into your ear. You can clean the opening of your ear canal with a washcloth or facial tissue. This information is not intended to replace advice given to you by your health care provider. Make sure you discuss any questions you have with your health care provider. Document Released: 02/16/2004 Document Revised: 03/21/2016 Document Reviewed: 03/21/2016 Elsevier Interactive Patient Education  2018 [employer]  

## 2017-06-12 ENCOUNTER — Ambulatory Visit: Payer: Medicare HMO

## 2017-06-12 ENCOUNTER — Ambulatory Visit
Admission: RE | Admit: 2017-06-12 | Discharge: 2017-06-12 | Disposition: A | Discharge status: Home / Self Care | Payer: Medicare HMO | Source: Ambulatory Visit | Attending: Family Medicine | Admitting: Family Medicine

## 2017-06-12 DIAGNOSIS — R928 Other abnormal and inconclusive findings on diagnostic imaging of breast: Secondary | ICD-10-CM

## 2017-06-12 DIAGNOSIS — R922 Inconclusive mammogram: Secondary | ICD-10-CM | POA: Diagnosis not present

## 2017-06-28 ENCOUNTER — Other Ambulatory Visit: Payer: Self-pay | Admitting: Family Medicine

## 2017-06-28 DIAGNOSIS — M170 Bilateral primary osteoarthritis of knee: Secondary | ICD-10-CM

## 2017-07-02 ENCOUNTER — Inpatient Hospital Stay: Payer: Medicare HMO

## 2017-07-02 ENCOUNTER — Other Ambulatory Visit: Payer: Self-pay

## 2017-07-02 ENCOUNTER — Inpatient Hospital Stay: Payer: Medicare HMO | Attending: Family | Admitting: Family

## 2017-07-02 ENCOUNTER — Encounter: Payer: Self-pay | Admitting: Family

## 2017-07-02 ENCOUNTER — Other Ambulatory Visit: Payer: Self-pay | Admitting: Family

## 2017-07-02 VITALS — BP 147/67 | HR 113 | Temp 98.5°F | Wt 165.1 lb

## 2017-07-02 DIAGNOSIS — R531 Weakness: Secondary | ICD-10-CM | POA: Insufficient documentation

## 2017-07-02 DIAGNOSIS — D6959 Other secondary thrombocytopenia: Secondary | ICD-10-CM

## 2017-07-02 DIAGNOSIS — Z794 Long term (current) use of insulin: Secondary | ICD-10-CM | POA: Diagnosis not present

## 2017-07-02 DIAGNOSIS — D500 Iron deficiency anemia secondary to blood loss (chronic): Secondary | ICD-10-CM | POA: Insufficient documentation

## 2017-07-02 DIAGNOSIS — Z79899 Other long term (current) drug therapy: Secondary | ICD-10-CM | POA: Diagnosis not present

## 2017-07-02 DIAGNOSIS — R161 Splenomegaly, not elsewhere classified: Secondary | ICD-10-CM | POA: Insufficient documentation

## 2017-07-02 DIAGNOSIS — R5383 Other fatigue: Secondary | ICD-10-CM | POA: Insufficient documentation

## 2017-07-02 DIAGNOSIS — E119 Type 2 diabetes mellitus without complications: Secondary | ICD-10-CM | POA: Diagnosis not present

## 2017-07-02 DIAGNOSIS — D696 Thrombocytopenia, unspecified: Secondary | ICD-10-CM

## 2017-07-02 DIAGNOSIS — K7460 Unspecified cirrhosis of liver: Secondary | ICD-10-CM

## 2017-07-02 LAB — CBC WITH DIFFERENTIAL (CANCER CENTER ONLY)
Basophils Absolute: 0 10*3/uL (ref 0.0–0.1)
Basophils Relative: 1 %
Eosinophils Absolute: 0.1 10*3/uL (ref 0.0–0.5)
Eosinophils Relative: 2 %
HCT: 35.7 % (ref 34.8–46.6)
Hemoglobin: 11.8 g/dL (ref 11.6–15.9)
Lymphocytes Relative: 12 %
Lymphs Abs: 0.8 10*3/uL — ABNORMAL LOW (ref 0.9–3.3)
MCH: 30.3 pg (ref 26.0–34.0)
MCHC: 33.1 g/dL (ref 32.0–36.0)
MCV: 91.5 fL (ref 81.0–101.0)
Monocytes Absolute: 0.4 10*3/uL (ref 0.1–0.9)
Monocytes Relative: 6 %
Neutro Abs: 5 10*3/uL (ref 1.5–6.5)
Neutrophils Relative %: 79 %
Platelet Count: 82 10*3/uL — ABNORMAL LOW (ref 145–400)
RBC: 3.9 MIL/uL (ref 3.70–5.32)
RDW: 18.3 % — ABNORMAL HIGH (ref 11.1–15.7)
WBC Count: 6.3 10*3/uL (ref 3.9–10.0)

## 2017-07-02 LAB — CMP (CANCER CENTER ONLY)
ALT: 19 U/L (ref 0–55)
AST: 28 U/L (ref 5–34)
Albumin: 4 g/dL (ref 3.5–5.0)
Alkaline Phosphatase: 102 U/L (ref 40–150)
Anion gap: 9 (ref 3–11)
BUN: 22 mg/dL (ref 7–26)
CO2: 26 mmol/L (ref 22–29)
Calcium: 10.4 mg/dL (ref 8.4–10.4)
Chloride: 99 mmol/L (ref 98–109)
Creatinine: 1.42 mg/dL — ABNORMAL HIGH (ref 0.60–1.10)
GFR, Est AFR Am: 39 mL/min — ABNORMAL LOW (ref >60)
GFR, Estimated: 33 mL/min — ABNORMAL LOW (ref >60)
Glucose, Bld: 403 mg/dL — ABNORMAL HIGH (ref 70–140)
Potassium: 5.3 mmol/L — ABNORMAL HIGH (ref 3.5–5.1)
Sodium: 134 mmol/L — ABNORMAL LOW (ref 136–145)
Total Bilirubin: 1.2 mg/dL (ref 0.2–1.2)
Total Protein: 6.8 g/dL (ref 6.4–8.3)

## 2017-07-02 LAB — SAMPLE TO BLOOD BANK

## 2017-07-02 NOTE — Progress Notes (Signed)
Hematology and Oncology Follow Up Visit  Jessica Dennis [id number] Jan 13, 1933 84 y.o. 07/02/2017   Principle Diagnosis:  Chronic thrombocytopenia secondary to cirrhosis and splenomegaly Iron deficiency anemia secondary to GI bleed  Current Therapy:   IV iron as indicated- last received in May x 2 Red blood cell transfusion as needed for variceal bleeding- last received February 2019   Interim History:  Jessica Dennis is here today for follow-up. She received 2 doses of IV iron in May and states that she is still feeling fatigued and weak. She is unsure if this is due to her blood sugar being high. She does not check her blood sugars regularly at home despite being on insulin. Her CMP is pending.  She has had no episodes of bleeding, no bruising or petechiae.  No fever, chills, n/v, cough, rash, dizziness, confusion, vision changes, headaches, SOB, chest pain, palpitations, abdominal pain or changes in bowel or bladder habits.  No lymphadenopathy noted on exam.  No swelling or tenderness in her extremities. 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   She has maintained a good appetite and is staying well hydrated. Her weight is stable.   ECOG Performance Status: 1 - Symptomatic but completely ambulatory  Medications:  Allergies as of 07/02/2017   No Known Allergies     Medication List        Accurate as of 07/02/17 12:12 PM. Always use your most recent med list.          ACCU-CHEK FASTCLIX LANCETS Misc CHECK BLOOD SUGAR ONCE DAILY.   Calcium Carbonate-Vitamin D 600-200 MG-UNIT Caps Take by mouth.   Fish Oil 1000 MG Caps Take by mouth every morning.   Flax Seed Oil 1000 MG Caps Take by mouth.   furosemide 40 MG tablet Commonly known as:  LASIX Take 0.5 tablets (20 mg total) by mouth daily. Please take 40 mg by mouth daily In the am   glucose blood test strip Accu-Chek Fastclix Lancet Drum   latanoprost 0.005 % ophthalmic solution Commonly known as:  XALATAN Place 1  drop into both eyes at bedtime.   levothyroxine 75 MCG tablet Commonly known as:  SYNTHROID, LEVOTHROID TAKE ONE TABLET BY MOUTH DAILY   LORazepam 0.5 MG tablet Commonly known as:  ATIVAN TAKE ONE TABLET BY MOUTH DAILY AS NEEDED   Melatonin 5 MG Caps Take 5 mg by mouth.   meloxicam 7.5 MG tablet Commonly known as:  MOBIC TAKE ONE TABLET BY MOUTH TWICE A DAY   NOVOLOG MIX 70/30 FLEXPEN (70-30) 100 UNIT/ML FlexPen Generic drug:  insulin aspart protamine - aspart 60 Units daily. 35 in the morning and 25 at HS   pantoprazole 40 MG tablet Commonly known as:  PROTONIX TAKE ONE TABLET BY MOUTH DAILY   Potassium Chloride ER 20 MEQ Tbcr TAKE ONE TABLET BY MOUTH DAILY   Prednicarbate 0.1 % Crea Apply topically 2 (two) times daily.   simvastatin 20 MG tablet Commonly known as:  ZOCOR Take 1 tablet (20 mg total) by mouth at bedtime.   spironolactone 50 MG tablet Commonly known as:  ALDACTONE Take 1 tablet (50 mg total) by mouth daily.   tiZANidine 4 MG tablet Commonly known as:  ZANAFLEX 1 po qhs prn leg cramps   vitamin C 1000 MG tablet Take 1,000 mg by mouth daily.   vitamin E 100 UNIT capsule Take by mouth daily.       Allergies: No Known Allergies  Past Medical History, Surgical history, Social history, and  Family History were reviewed and updated.  Review of Systems: All other 10 point review of systems is negative.   Physical Exam:  weight is 165 lb 1.9 oz (74.9 kg). Her oral temperature is 98.5 F (36.9 C). Her blood pressure is 147/67 (abnormal) and her pulse is 113 (abnormal). Her oxygen saturation is 98%.   Wt Readings from Last 3 Encounters:  07/02/17 165 lb 1.9 oz (74.9 kg)  06/11/17 168 lb (76.2 kg)  05/21/17 168 lb 8 oz (76.4 kg)    Ocular: Sclerae unicteric, pupils equal, round and reactive to light Ear-nose-throat: Oropharynx clear, dentition fair Lymphatic: No cervical, supraclavicular or axillary adenopathy Lungs no rales or rhonchi, good  excursion bilaterally Heart regular rate and rhythm, no murmur appreciated Abd soft, nontender, positive bowel sounds, no liver or spleen tip palpated in exam, no fluid wave  MSK no focal spinal tenderness, no joint edema Neuro: non-focal, well-oriented, appropriate affect Breasts: Deferred   Lab Results  Component Value Date   WBC 6.3 07/02/2017   HGB 11.8 07/02/2017   HCT 35.7 07/02/2017   MCV 91.5 07/02/2017   PLT 82 (L) 07/02/2017   Lab Results  Component Value Date   FERRITIN 14 05/21/2017   IRON 31 (L) 05/21/2017   TIBC 350 05/21/2017   UIBC 319 05/21/2017   IRONPCTSAT 9 (L) 05/21/2017   Lab Results  Component Value Date   RBC 3.90 07/02/2017   No results found for: KPAFRELGTCHN, LAMBDASER, KAPLAMBRATIO No results found for: IGGSERUM, IGA, IGMSERUM No results found for: Odetta Pink, SPEI   Chemistry      Component Value Date/Time   NA 139 05/21/2017 1104   NA 138 12/18/2016 1314   NA 139 03/05/2016 1139   K 4.7 05/21/2017 1104   K 4.3 12/18/2016 1314   K 3.6 03/05/2016 1139   CL 108 05/21/2017 1104   CL 101 12/18/2016 1314   CO2 23 05/21/2017 1104   CO2 28 12/18/2016 1314   CO2 26 03/05/2016 1139   BUN 23 05/21/2017 1104   BUN 33 (H) 12/18/2016 1314   BUN 14.3 03/05/2016 1139   CREATININE 1.07 05/21/2017 1104   CREATININE 1.3 (H) 12/18/2016 1314   CREATININE 0.7 03/05/2016 1139      Component Value Date/Time   CALCIUM 9.5 05/21/2017 1104   CALCIUM 10.0 12/18/2016 1314   CALCIUM 9.3 03/05/2016 1139   ALKPHOS 98 05/21/2017 1104   ALKPHOS 94 (H) 12/18/2016 1314   ALKPHOS 127 03/05/2016 1139   AST 28 05/21/2017 1104   AST 29 03/05/2016 1139   ALT 20 05/21/2017 1104   ALT 26 12/18/2016 1314   ALT 22 03/05/2016 1139   BILITOT 1.0 05/21/2017 1104   BILITOT 1.75 (H) 03/05/2016 1139      Impression and Plan: Jessica Dennis is a very pleasant 84 yo caucasian female with chronic thrombocytopenia and iron  deficiency anemia secondary to intermittent GI blood loss. Her Hgb is now up to 11.9 after receiving 2 doses of IV iron.  She is still symptomatic with fatigue and weakness.  We will see what her iron studies show and bring her back in for infusion if needed. She will start checking her blood sugars twice a day at home.  We will go ahead and plan to see her back in another 6 weeks for follow-up.  She will contact our office with any questions or concerns. We can certainly see her sooner if need be.  [name], NP 6/11/201912:12 PM

## 2017-07-03 LAB — IRON AND TIBC
Iron: 79 ug/dL (ref 41–142)
Saturation Ratios: 26 % (ref 21–57)
TIBC: 310 ug/dL (ref 236–444)
UIBC: 231 ug/dL

## 2017-07-03 LAB — FERRITIN: Ferritin: 37 ng/mL (ref 9–269)

## 2017-07-12 ENCOUNTER — Other Ambulatory Visit (INDEPENDENT_AMBULATORY_CARE_PROVIDER_SITE_OTHER): Payer: Medicare HMO

## 2017-07-12 ENCOUNTER — Ambulatory Visit: Payer: Medicare HMO | Admitting: Internal Medicine

## 2017-07-12 ENCOUNTER — Encounter: Payer: Self-pay | Admitting: Internal Medicine

## 2017-07-12 DIAGNOSIS — K7031 Alcoholic cirrhosis of liver with ascites: Secondary | ICD-10-CM | POA: Diagnosis not present

## 2017-07-12 DIAGNOSIS — R69 Illness, unspecified: Secondary | ICD-10-CM | POA: Diagnosis not present

## 2017-07-12 LAB — PROTIME-INR
INR: 1.2 ratio — ABNORMAL HIGH (ref 0.8–1.0)
Prothrombin Time: 13.6 s — ABNORMAL HIGH (ref 9.6–13.1)

## 2017-07-12 NOTE — Patient Instructions (Addendum)
If you are age 82 or older, your body mass index should be between 23-30. Your Body mass index is 27.32 kg/m. If this is out of the aforementioned range listed, please consider follow up with your Primary Care Provider.  If you are age 66 or younger, your body mass index should be between 19-25. Your Body mass index is 27.32 kg/m. If this is out of the aformentioned range listed, please consider follow up with your Primary Care Provider.   You have been scheduled for an endoscopy. Please follow written instructions given to you at your visit today. If you use inhalers (even only as needed), please bring them with you on the day of your procedure. Your physician has requested that you go to www.startemmi.com and enter the access code given to you at your visit today. This web site gives a general overview about your procedure. However, you should still follow specific instructions given to you by our office regarding your preparation for the procedure.  Your provider has requested that you go to the basement level for lab work before leaving today. Press "B" on the elevator. The lab is located at the first door on the left as you exit the elevator.  I appreciate the opportunity to care for you. Silvano Rusk, MD, [facility]

## 2017-07-12 NOTE — Progress Notes (Signed)
Jessica Dennis 84 y.o. 12/11/1932 [id number]  Assessment & Plan:  Alcoholic cirrhosis of liver with ascites ([place]) Doing well egd f/u AFP and PT/INR  RTC 6 mos  The risks and benefits as well as alternatives of endoscopic procedure(s) have been discussed and reviewed. All questions answered. The patient agrees to proceed.  I appreciate the opportunity to care for this patient. CC: Ann Held, DO  Subjective:   Chief Complaint: Follow-up of cirrhosis history of ascites  HPI  Jessica Dennis is here and reports she is stable and doing well without any new health issues.  Her weight is overall stable.  She had an ultrasound in February with stable cirrhosis and no liver lesions.  She will be due for a surveillance EGD due to decompensated cirrhosis history and screening for varices in July. Wt Readings from Last 3 Encounters:  07/12/17 168 lb (76.2 kg)  07/02/17 165 lb 1.9 oz (74.9 kg)  06/11/17 168 lb (76.2 kg)    No Known Allergies Current Meds  Medication Sig  . ACCU-CHEK FASTCLIX LANCETS MISC CHECK BLOOD SUGAR ONCE DAILY.  Marland Kitchen Ascorbic Acid (VITAMIN C) 1000 MG tablet Take 1,000 mg by mouth daily.  . Calcium Carbonate-Vitamin D 600-200 MG-UNIT CAPS Take by mouth.  . Flaxseed, Linseed, (FLAX SEED OIL) 1000 MG CAPS Take by mouth.  . furosemide (LASIX) 40 MG tablet Take 0.5 tablets (20 mg total) by mouth daily. Please take 40 mg by mouth daily In the am  . glucose blood test strip Accu-Chek [employer]  . Insulin Aspart Prot & Aspart (NOVOLOG MIX 70/30 [place]) Inject into the skin. 35 units in the mornings and 25 at night  . latanoprost (XALATAN) 0.005 % ophthalmic solution Place 1 drop into both eyes at bedtime.   Marland Kitchen levothyroxine (SYNTHROID, LEVOTHROID) 75 MCG tablet TAKE ONE TABLET BY MOUTH DAILY  . LORazepam (ATIVAN) 0.5 MG tablet TAKE ONE TABLET BY MOUTH DAILY AS NEEDED  . Melatonin 5 MG CAPS Take 5 mg by mouth.  . meloxicam (MOBIC) 7.5 MG tablet TAKE ONE TABLET  BY MOUTH TWICE A DAY  . Omega-3 Fatty Acids (FISH OIL) 1000 MG CAPS Take by mouth every morning.  . pantoprazole (PROTONIX) 40 MG tablet TAKE ONE TABLET BY MOUTH DAILY  . Potassium Chloride ER 20 MEQ TBCR TAKE ONE TABLET BY MOUTH DAILY  . Prednicarbate 0.1 % CREA Apply topically 2 (two) times daily.   . simvastatin (ZOCOR) 20 MG tablet Take 1 tablet (20 mg total) by mouth at bedtime.  Marland Kitchen spironolactone (ALDACTONE) 50 MG tablet Take 1 tablet (50 mg total) by mouth daily.  Marland Kitchen tiZANidine (ZANAFLEX) 4 MG tablet 1 po qhs prn leg cramps  . vitamin E 100 UNIT capsule Take by mouth daily.   Past Medical History:  Diagnosis Date  . Anemia   . Angiodysplasia of ascending colon 10/25/2014  . Anxiety   . Arthritis   . Borderline diabetes   . Cancer of the skin, basal cell 09/03/2012  . Cirrhosis ([place])   . Diabetes mellitus without complication ([place])   . Diverticular disease   . GERD (gastroesophageal reflux disease)   . Hypertension   . Iron deficiency anemia due to chronic blood loss 01/19/2015  . Portal hypertensive gastropathy ([place]) 08/02/2016   ? Some GAVE also  . Thyroid disease    Past Surgical History:  Procedure Laterality Date  . ABDOMINAL HYSTERECTOMY    . APPENDECTOMY    . COLONOSCOPY    .  ESOPHAGOGASTRODUODENOSCOPY    . IR PARACENTESIS  05/25/2016  . LEG SKIN LESION  BIOPSY / EXCISION  12/11/14  . MOHS SURGERY     ankle  . TONSILLECTOMY    . TOTAL HIP ARTHROPLASTY Bilateral 1993, 2006  . UPPER GASTROINTESTINAL ENDOSCOPY     Social History   Social History Narrative   The patient is divorced. She is originally from [place]. She has 2 sons. 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   08/10/2014   family history includes Bladder Cancer in her father; Diabetes in her mother; Heart attack in her father.   Review of Systems As above  Objective:   Physical Exam BP 108/60 (BP Location: Left Arm, Patient Position: Sitting, Cuff Size: Normal)    Pulse (!) 120   Ht 5' 5.75" (1.67 m)   Wt 168 lb (76.2 kg)   BMI 27.32 kg/m  @BP  108/60 (BP Location: Left Arm, Patient Position: Sitting, Cuff Size: Normal)   Pulse 90   Ht 5' 5.75" (1.67 m)   Wt 168 lb (76.2 kg)   BMI 27.32 kg/m @  General:  NAD Eyes:   anicteric Lungs:  clear Heart::  S1S2 no rubs, murmurs or gallops Abdomen:  soft and nontender, BS+ Ext:   no edema, cyanosis or clubbing She is alert and oriented x3   Data Reviewed:  See HPI

## 2017-07-12 NOTE — Assessment & Plan Note (Signed)
Doing well egd f/u AFP and PT/INR

## 2017-07-15 LAB — AFP TUMOR MARKER: AFP-Tumor Marker: 3.1 ng/mL

## 2017-07-19 NOTE — Progress Notes (Signed)
Labs ok.

## 2017-07-26 ENCOUNTER — Telehealth: Payer: Self-pay | Admitting: *Deleted

## 2017-07-26 NOTE — Telephone Encounter (Signed)
[facility] faxed over a sheet stating that patient has an rx for spironolactone from Dr. Carlean Purl.  They want to know if patient should be taking furosemide (from [place])  and spironolactone?

## 2017-07-29 NOTE — Telephone Encounter (Signed)
Stop the lasix and take the spironolactone   I will forward to Dr Lorayne Bender as well for [facility]

## 2017-07-30 DIAGNOSIS — Z833 Family history of diabetes mellitus: Secondary | ICD-10-CM | POA: Diagnosis not present

## 2017-07-30 DIAGNOSIS — Z85828 Personal history of other malignant neoplasm of skin: Secondary | ICD-10-CM | POA: Diagnosis not present

## 2017-07-30 DIAGNOSIS — H409 Unspecified glaucoma: Secondary | ICD-10-CM | POA: Diagnosis not present

## 2017-07-30 DIAGNOSIS — Z809 Family history of malignant neoplasm, unspecified: Secondary | ICD-10-CM | POA: Diagnosis not present

## 2017-07-30 DIAGNOSIS — Z823 Family history of stroke: Secondary | ICD-10-CM | POA: Diagnosis not present

## 2017-07-30 NOTE — Telephone Encounter (Signed)
Notified  Don at [facility] and med list has been updated.

## 2017-07-30 NOTE — Addendum Note (Signed)
Addended by: Kelle Darting A on: 07/30/2017 05:20 PM   Modules accepted: Orders

## 2017-08-01 ENCOUNTER — Telehealth: Payer: Self-pay | Admitting: Family Medicine

## 2017-08-01 DIAGNOSIS — R69 Illness, unspecified: Secondary | ICD-10-CM | POA: Diagnosis not present

## 2017-08-01 MED ORDER — ACCU-CHEK FASTCLIX LANCETS MISC: 6 refills | Status: DC

## 2017-08-01 NOTE — Telephone Encounter (Signed)
Copied from [place] [phone]. Topic: Quick Communication - Rx Refill/Question >> Aug 01, 2017  5:07 PM Jessica Dennis wrote: Medication: ACCU-CHEK FASTCLIX LANCETS MISC   Has the patient contacted their pharmacy? Yes.   (Agent: If no, request that the patient contact the pharmacy for the refill.) (Agent: If yes, when and what did the pharmacy advise?)  Preferred Pharmacy (with phone number or street name): [facility], [address] [place] [place] [address] Phone: [phone] Fax: [phone]  Agent: Please be advised that RX refills may take up to 3 business days. We ask that you follow-up with your pharmacy.

## 2017-08-07 ENCOUNTER — Telehealth: Payer: Self-pay

## 2017-08-07 DIAGNOSIS — K7030 Alcoholic cirrhosis of liver without ascites: Secondary | ICD-10-CM

## 2017-08-07 MED ORDER — FUROSEMIDE 20 MG PO TABS: 20.0000 mg | ORAL_TABLET | Freq: Every day | ORAL | 0 refills | Status: DC

## 2017-08-07 NOTE — Telephone Encounter (Signed)
Ok  Restart that and stay on the k but have her do a bmet next week still

## 2017-08-07 NOTE — Telephone Encounter (Signed)
Jessica Dennis said she is not swelling and no weight gain since off the furosemide. She is taking her KCL once daily. She said she will call us if she starts to swell or gain weight. And she said thank you for checking on her.

## 2017-08-07 NOTE — Telephone Encounter (Signed)
OK  Have her stop her potassium and check BMET next week please  Dx: cirrhosis

## 2017-08-07 NOTE — Telephone Encounter (Signed)
-----   Message from Gatha Mayer, MD sent at 08/06/2017  6:04 PM EDT ----- Regarding: weight? swelling? Please check with her  - her furosemide was stopped  Is she swelling or gaining weight off this?  Is she still taking KCl?

## 2017-08-07 NOTE — Telephone Encounter (Signed)
I called and left her a detailed message to stop her potassium and come get lab work next week. I also told her to call and let me know she got this message. BMET order entered.

## 2017-08-07 NOTE — Telephone Encounter (Signed)
I called and spoke with Trudi. She verbalized understanding the plan. I refilled her generic lasix and she will stay on the K and come next week for lab work.

## 2017-08-07 NOTE — Telephone Encounter (Signed)
Jessica Dennis called me back and now she said I think my feet are swelling some and she wants a refill on her furosemide. I told her I will get Dr Carlean Purl to advise.

## 2017-08-12 ENCOUNTER — Other Ambulatory Visit (INDEPENDENT_AMBULATORY_CARE_PROVIDER_SITE_OTHER): Payer: Medicare HMO

## 2017-08-12 DIAGNOSIS — K7030 Alcoholic cirrhosis of liver without ascites: Secondary | ICD-10-CM

## 2017-08-12 DIAGNOSIS — R69 Illness, unspecified: Secondary | ICD-10-CM | POA: Diagnosis not present

## 2017-08-12 LAB — BASIC METABOLIC PANEL
BUN: 32 mg/dL — ABNORMAL HIGH (ref 6–23)
CO2: 24 mEq/L (ref 19–32)
Calcium: 8.7 mg/dL (ref 8.4–10.5)
Chloride: 103 mEq/L (ref 96–112)
Creatinine, Ser: 1.51 mg/dL — ABNORMAL HIGH (ref 0.40–1.20)
GFR: 35.01 mL/min — ABNORMAL LOW (ref >60.00)
Glucose, Bld: 391 mg/dL — ABNORMAL HIGH (ref 70–99)
Potassium: 5.3 mEq/L — ABNORMAL HIGH (ref 3.5–5.1)
Sodium: 132 mEq/L — ABNORMAL LOW (ref 135–145)

## 2017-08-12 NOTE — Progress Notes (Signed)
Blood sugar quite high and kidney function is reduced and worse  I need her to take the furosemide and spironolactone on alternating days not every day - so take one one day and the other the next but not both on same day  She will need some follow-up with Dr. Cheri Rous (I have cced) re: diabetes I suspect Her A1 C was good in April so not sure what is up

## 2017-08-13 ENCOUNTER — Encounter: Payer: Self-pay | Admitting: Family

## 2017-08-13 ENCOUNTER — Inpatient Hospital Stay (HOSPITAL_BASED_OUTPATIENT_CLINIC_OR_DEPARTMENT_OTHER): Payer: Medicare HMO | Admitting: Family

## 2017-08-13 ENCOUNTER — Other Ambulatory Visit: Payer: Self-pay

## 2017-08-13 ENCOUNTER — Inpatient Hospital Stay: Payer: Medicare HMO | Attending: Family

## 2017-08-13 ENCOUNTER — Inpatient Hospital Stay: Payer: Medicare HMO

## 2017-08-13 VITALS — BP 123/60 | HR 94 | Resp 18

## 2017-08-13 VITALS — BP 131/60 | HR 103 | Temp 98.5°F | Resp 20 | Wt 170.8 lb

## 2017-08-13 DIAGNOSIS — D500 Iron deficiency anemia secondary to blood loss (chronic): Secondary | ICD-10-CM | POA: Insufficient documentation

## 2017-08-13 DIAGNOSIS — Z79899 Other long term (current) drug therapy: Secondary | ICD-10-CM

## 2017-08-13 DIAGNOSIS — R5383 Other fatigue: Secondary | ICD-10-CM

## 2017-08-13 DIAGNOSIS — R161 Splenomegaly, not elsewhere classified: Secondary | ICD-10-CM

## 2017-08-13 DIAGNOSIS — K7460 Unspecified cirrhosis of liver: Secondary | ICD-10-CM

## 2017-08-13 DIAGNOSIS — D6959 Other secondary thrombocytopenia: Secondary | ICD-10-CM | POA: Insufficient documentation

## 2017-08-13 DIAGNOSIS — K219 Gastro-esophageal reflux disease without esophagitis: Secondary | ICD-10-CM

## 2017-08-13 DIAGNOSIS — I864 Gastric varices: Secondary | ICD-10-CM

## 2017-08-13 DIAGNOSIS — K5520 Angiodysplasia of colon without hemorrhage: Secondary | ICD-10-CM

## 2017-08-13 DIAGNOSIS — R195 Other fecal abnormalities: Secondary | ICD-10-CM

## 2017-08-13 DIAGNOSIS — D696 Thrombocytopenia, unspecified: Secondary | ICD-10-CM

## 2017-08-13 LAB — CMP (CANCER CENTER ONLY)
ALT: 18 U/L (ref 0–44)
AST: 22 U/L (ref 15–41)
Albumin: 3.7 g/dL (ref 3.5–5.0)
Alkaline Phosphatase: 101 U/L (ref 38–126)
Anion gap: 6 (ref 5–15)
BUN: 33 mg/dL — ABNORMAL HIGH (ref 8–23)
CO2: 23 mmol/L (ref 22–32)
Calcium: 9.3 mg/dL (ref 8.9–10.3)
Chloride: 105 mmol/L (ref 98–111)
Creatinine: 1.52 mg/dL — ABNORMAL HIGH (ref 0.44–1.00)
GFR, Est AFR Am: 36 mL/min — ABNORMAL LOW (ref >60)
GFR, Estimated: 31 mL/min — ABNORMAL LOW (ref >60)
Glucose, Bld: 377 mg/dL — ABNORMAL HIGH (ref 70–99)
Potassium: 5.4 mmol/L — ABNORMAL HIGH (ref 3.5–5.1)
Sodium: 134 mmol/L — ABNORMAL LOW (ref 135–145)
Total Bilirubin: 1 mg/dL (ref 0.3–1.2)
Total Protein: 6.3 g/dL — ABNORMAL LOW (ref 6.5–8.1)

## 2017-08-13 LAB — CBC WITH DIFFERENTIAL (CANCER CENTER ONLY)
Basophils Absolute: 0 10*3/uL (ref 0.0–0.1)
Basophils Relative: 1 %
Eosinophils Absolute: 0.1 10*3/uL (ref 0.0–0.5)
Eosinophils Relative: 3 %
HCT: 28 % — ABNORMAL LOW (ref 34.8–46.6)
Hemoglobin: 8.8 g/dL — ABNORMAL LOW (ref 11.6–15.9)
Lymphocytes Relative: 12 %
Lymphs Abs: 0.5 10*3/uL — ABNORMAL LOW (ref 0.9–3.3)
MCH: 26.9 pg (ref 26.0–34.0)
MCHC: 31.4 g/dL — ABNORMAL LOW (ref 32.0–36.0)
MCV: 85.6 fL (ref 81.0–101.0)
Monocytes Absolute: 0.3 10*3/uL (ref 0.1–0.9)
Monocytes Relative: 7 %
Neutro Abs: 3.2 10*3/uL (ref 1.5–6.5)
Neutrophils Relative %: 77 %
Platelet Count: 71 10*3/uL — ABNORMAL LOW (ref 145–400)
RBC: 3.27 MIL/uL — ABNORMAL LOW (ref 3.70–5.32)
RDW: 14 % (ref 11.1–15.7)
WBC Count: 4.2 10*3/uL (ref 3.9–10.0)

## 2017-08-13 LAB — SAMPLE TO BLOOD BANK

## 2017-08-13 MED ORDER — SODIUM CHLORIDE 0.9 % IV SOLN
Freq: Once | INTRAVENOUS | Status: AC
  Administered 2017-08-13: 15:00:00 via INTRAVENOUS

## 2017-08-13 MED ORDER — SODIUM CHLORIDE 0.9 % IV SOLN
510.0000 mg | Freq: Once | INTRAVENOUS | Status: AC
  Administered 2017-08-13: 510 mg via INTRAVENOUS
  Filled 2017-08-13: qty 17

## 2017-08-13 NOTE — Patient Instructions (Signed)

## 2017-08-13 NOTE — Progress Notes (Signed)
Hematology and Oncology Follow Up Visit  Janayah Zavada [id number] 09-29-1932 84 y.o. 08/13/2017   Principle Diagnosis:  Chronic thrombocytopenia secondary to cirrhosis and splenomegaly Iron deficiency anemia secondary to GI bleed  Current Therapy:   IV iron as indicated- last received in May 2019 x 2 Red blood cell transfusion as needed for variceal bleeding- last received February 2019   Interim History:  Ms. Brunke is here today for follow-up. She is symptomatic with fatigue. Hgb is 9.9.  She states that her stool at times can be dark but not tarry. No frank blood.  No bruising or petechiae.  No fever, chills, n/v, cough, rash, dizziness, SOB, chest pain, palpitations, abdominal pain or changes in bowel or bladder habits.  Neuropathy in her feet is unchanged. No swelling or tenderness in her extremities.  No lymphadenopathy noted on exam.  She has maintained a good appetite and is staying well hydrated. Her weight is stable.   ECOG Performance Status: 1 - Symptomatic but completely ambulatory  Medications:  Allergies as of 08/13/2017   No Known Allergies     Medication List        Accurate as of 08/13/17  1:40 PM. Always use your most recent med list.          ACCU-CHEK FASTCLIX LANCETS Misc CHECK BLOOD SUGAR ONCE DAILY.   Calcium Carbonate-Vitamin D 600-200 MG-UNIT Caps Take by mouth.   Fish Oil 1000 MG Caps Take by mouth every morning.   Flax Seed Oil 1000 MG Caps Take by mouth.   furosemide 20 MG tablet Commonly known as:  LASIX Take 1 tablet (20 mg total) by mouth daily.   glucose blood test strip Accu-Chek Fastclix Lancet Drum   latanoprost 0.005 % ophthalmic solution Commonly known as:  XALATAN Place 1 drop into both eyes at bedtime.   levothyroxine 75 MCG tablet Commonly known as:  SYNTHROID, LEVOTHROID TAKE ONE TABLET BY MOUTH DAILY   LORazepam 0.5 MG tablet Commonly known as:  ATIVAN TAKE ONE TABLET BY MOUTH DAILY AS NEEDED   Melatonin  5 MG Caps Take 5 mg by mouth.   meloxicam 7.5 MG tablet Commonly known as:  MOBIC TAKE ONE TABLET BY MOUTH TWICE A DAY   NOVOLOG MIX 70/30 [place] Inject into the skin. 35 units in the mornings and 25 at night   pantoprazole 40 MG tablet Commonly known as:  PROTONIX TAKE ONE TABLET BY MOUTH DAILY   Potassium Chloride ER 20 MEQ Tbcr TAKE ONE TABLET BY MOUTH DAILY   Prednicarbate 0.1 % Crea Apply topically 2 (two) times daily.   simvastatin 20 MG tablet Commonly known as:  ZOCOR Take 1 tablet (20 mg total) by mouth at bedtime.   spironolactone 50 MG tablet Commonly known as:  ALDACTONE Take 1 tablet (50 mg total) by mouth daily.   tiZANidine 4 MG tablet Commonly known as:  ZANAFLEX 1 po qhs prn leg cramps   vitamin C 1000 MG tablet Take 1,000 mg by mouth daily.   vitamin E 100 UNIT capsule Take by mouth daily.       Allergies: No Known Allergies  Past Medical History, Surgical history, Social history, and Family History were reviewed and updated.  Review of Systems: All other 10 point review of systems is negative.   Physical Exam:  vitals were not taken for this visit.   Wt Readings from Last 3 Encounters:  07/12/17 168 lb (76.2 kg)  07/02/17 165 lb 1.9 oz (74.9 kg)  06/11/17 168  lb (76.2 kg)    Ocular: Sclerae unicteric, pupils equal, round and reactive to light Ear-nose-throat: Oropharynx clear, dentition fair Lymphatic: No cervical, supraclavicular or axillary adenopathy Lungs no rales or rhonchi, good excursion bilaterally Heart regular rate and rhythm, no murmur appreciated Abd soft, nontender, positive bowel sounds, no liver or spleen tip palpated on exam, no fluid wave  MSK no focal spinal tenderness, no joint edema Neuro: non-focal, well-oriented, appropriate affect Breasts: Deferred   Lab Results  Component Value Date   WBC 4.2 08/13/2017   HGB 8.8 (L) 08/13/2017   HCT 28.0 (L) 08/13/2017   MCV 85.6 08/13/2017   PLT 71 (L) 08/13/2017   Lab  Results  Component Value Date   FERRITIN 37 07/02/2017   IRON 79 07/02/2017   TIBC 310 07/02/2017   UIBC 231 07/02/2017   IRONPCTSAT 26 07/02/2017   Lab Results  Component Value Date   RBC 3.27 (L) 08/13/2017   No results found for: KPAFRELGTCHN, LAMBDASER, KAPLAMBRATIO No results found for: IGGSERUM, IGA, IGMSERUM No results found for: Kathrynn Ducking, MSPIKE, SPEI   Chemistry      Component Value Date/Time   NA 132 (L) 08/12/2017 1343   NA 138 12/18/2016 1314   NA 139 03/05/2016 1139   K 5.3 (H) 08/12/2017 1343   K 4.3 12/18/2016 1314   K 3.6 03/05/2016 1139   CL 103 08/12/2017 1343   CL 101 12/18/2016 1314   CO2 24 08/12/2017 1343   CO2 28 12/18/2016 1314   CO2 26 03/05/2016 1139   BUN 32 (H) 08/12/2017 1343   BUN 33 (H) 12/18/2016 1314   BUN 14.3 03/05/2016 1139   CREATININE 1.51 (H) 08/12/2017 1343   CREATININE 1.42 (H) 07/02/2017 1135   CREATININE 1.3 (H) 12/18/2016 1314   CREATININE 0.7 03/05/2016 1139      Component Value Date/Time   CALCIUM 8.7 08/12/2017 1343   CALCIUM 10.0 12/18/2016 1314   CALCIUM 9.3 03/05/2016 1139   ALKPHOS 102 07/02/2017 1135   ALKPHOS 94 (H) 12/18/2016 1314   ALKPHOS 127 03/05/2016 1139   AST 28 07/02/2017 1135   AST 29 03/05/2016 1139   ALT 19 07/02/2017 1135   ALT 26 12/18/2016 1314   ALT 22 03/05/2016 1139   BILITOT 1.2 07/02/2017 1135   BILITOT 1.75 (H) 03/05/2016 1139      Impression and Plan: Ms. Mells is a very pleasant 84 yo female with chronic thrombocytopenia and iron deficiency anemia secondary to intermittent GI blood loss. Hgb today is 8.8, platelet count 71.  She has not noticed any obvious blood loss.  We will give her IV iron today and possibly a second dose next week.  We will go ahead and plan to see her back in another 6 weeks.  She will contact our office with any questions or concerns. We can certainly see her sooner if need be.   [name],  NP 7/23/20191:40 PM

## 2017-08-14 LAB — IRON AND TIBC
Iron: 32 ug/dL — ABNORMAL LOW (ref 41–142)
Saturation Ratios: 9 % — ABNORMAL LOW (ref 21–57)
TIBC: 353 ug/dL (ref 236–444)
UIBC: 320 ug/dL

## 2017-08-14 LAB — FERRITIN: Ferritin: 13 ng/mL (ref 11–307)

## 2017-08-20 ENCOUNTER — Ambulatory Visit: Payer: Medicare HMO

## 2017-08-21 ENCOUNTER — Inpatient Hospital Stay: Payer: Medicare HMO

## 2017-08-21 ENCOUNTER — Other Ambulatory Visit: Payer: Self-pay

## 2017-08-21 VITALS — BP 133/56 | HR 95 | Temp 98.2°F | Resp 18

## 2017-08-21 DIAGNOSIS — D6959 Other secondary thrombocytopenia: Secondary | ICD-10-CM | POA: Diagnosis not present

## 2017-08-21 DIAGNOSIS — R195 Other fecal abnormalities: Secondary | ICD-10-CM

## 2017-08-21 DIAGNOSIS — R5383 Other fatigue: Secondary | ICD-10-CM | POA: Diagnosis not present

## 2017-08-21 DIAGNOSIS — I864 Gastric varices: Secondary | ICD-10-CM

## 2017-08-21 DIAGNOSIS — K219 Gastro-esophageal reflux disease without esophagitis: Secondary | ICD-10-CM

## 2017-08-21 DIAGNOSIS — D500 Iron deficiency anemia secondary to blood loss (chronic): Secondary | ICD-10-CM | POA: Diagnosis not present

## 2017-08-21 DIAGNOSIS — K7460 Unspecified cirrhosis of liver: Secondary | ICD-10-CM | POA: Diagnosis not present

## 2017-08-21 DIAGNOSIS — Z79899 Other long term (current) drug therapy: Secondary | ICD-10-CM | POA: Diagnosis not present

## 2017-08-21 DIAGNOSIS — R161 Splenomegaly, not elsewhere classified: Secondary | ICD-10-CM | POA: Diagnosis not present

## 2017-08-21 DIAGNOSIS — K5520 Angiodysplasia of colon without hemorrhage: Secondary | ICD-10-CM

## 2017-08-21 MED ORDER — SODIUM CHLORIDE 0.9 % IV SOLN
Freq: Once | INTRAVENOUS | Status: DC
  Filled 2017-08-21: qty 250

## 2017-08-21 MED ORDER — SODIUM CHLORIDE 0.9 % IV SOLN
Freq: Once | INTRAVENOUS | Status: AC
  Administered 2017-08-21: 13:00:00 via INTRAVENOUS
  Filled 2017-08-21: qty 250

## 2017-08-21 MED ORDER — SODIUM CHLORIDE 0.9 % IV SOLN
510.0000 mg | Freq: Once | INTRAVENOUS | Status: AC
  Administered 2017-08-21: 510 mg via INTRAVENOUS
  Filled 2017-08-21: qty 17

## 2017-08-21 NOTE — Patient Instructions (Signed)

## 2017-09-01 ENCOUNTER — Other Ambulatory Visit: Payer: Self-pay | Admitting: Family Medicine

## 2017-09-01 DIAGNOSIS — I10 Essential (primary) hypertension: Secondary | ICD-10-CM

## 2017-09-09 ENCOUNTER — Encounter: Payer: Self-pay | Admitting: Internal Medicine

## 2017-09-09 ENCOUNTER — Ambulatory Visit (AMBULATORY_SURGERY_CENTER): Payer: Medicare HMO | Admitting: Internal Medicine

## 2017-09-09 VITALS — BP 143/75 | HR 75 | Temp 98.6°F | Resp 17 | Ht 65.0 in | Wt 168.0 lb

## 2017-09-09 DIAGNOSIS — K3189 Other diseases of stomach and duodenum: Secondary | ICD-10-CM

## 2017-09-09 DIAGNOSIS — K7031 Alcoholic cirrhosis of liver with ascites: Secondary | ICD-10-CM | POA: Diagnosis not present

## 2017-09-09 DIAGNOSIS — K766 Portal hypertension: Secondary | ICD-10-CM | POA: Diagnosis not present

## 2017-09-09 DIAGNOSIS — R69 Illness, unspecified: Secondary | ICD-10-CM | POA: Diagnosis not present

## 2017-09-09 DIAGNOSIS — K31819 Angiodysplasia of stomach and duodenum without bleeding: Secondary | ICD-10-CM | POA: Diagnosis not present

## 2017-09-09 DIAGNOSIS — K7460 Unspecified cirrhosis of liver: Secondary | ICD-10-CM | POA: Diagnosis not present

## 2017-09-09 HISTORY — DX: Angiodysplasia of stomach and duodenum without bleeding: K31.819

## 2017-09-09 HISTORY — PX: UPPER GASTROINTESTINAL ENDOSCOPY: SHX188

## 2017-09-09 MED ORDER — SODIUM CHLORIDE 0.9 % IV SOLN: 500.0000 mL | Freq: Once | INTRAVENOUS | Status: DC

## 2017-09-09 NOTE — Progress Notes (Signed)
A/ox3, pleased with MAC, report to RN 

## 2017-09-09 NOTE — Op Note (Signed)
[facility] Patient Name: Jessica Dennis Procedure Date: 09/09/2017 10:18 AM MRN: [id number] Endoscopist: Gatha Mayer , MD Age: 84 Referring MD:  Date of Birth: 07/20/1932 Gender: Female Account #: [id number] Procedure:                Upper GI endoscopy Indications:              Cirrhosis rule out esophageal varices Medicines:                Propofol per Anesthesia, Monitored Anesthesia Care Procedure:                Pre-Anesthesia Assessment:                           - Prior to the procedure, a History and Physical                            was performed, and patient medications and                            allergies were reviewed. The patient's tolerance of                            previous anesthesia was also reviewed. The risks                            and benefits of the procedure and the sedation                            options and risks were discussed with the patient.                            All questions were answered, and informed consent                            was obtained. Prior Anticoagulants: The patient has                            taken no previous anticoagulant or antiplatelet                            agents. ASA Grade Assessment: III - A patient with                            severe systemic disease. After reviewing the risks                            and benefits, the patient was deemed in                            satisfactory condition to undergo the procedure.                           After obtaining informed consent, the endoscope was  passed under direct vision. Throughout the                            procedure, the patient's blood pressure, pulse, and                            oxygen saturations were monitored continuously. The                            Endoscope was introduced through the mouth, and                            advanced to the second part of duodenum. The upper               GI endoscopy was accomplished without difficulty.                            The patient tolerated the procedure well. Scope In: Scope Out: Findings:                 The examined esophagus was normal.                           Mild gastric antral vascular ectasia was present in                            the gastric antrum. Suspected vs portal gastropathy                           Mild portal hypertensive gastropathy was found in                            the gastric antrum.                           The exam was otherwise without abnormality.                           The cardia and gastric fundus were normal on                            retroflexion. Complications:            No immediate complications. Estimated Blood Loss:     Estimated blood loss: none. Impression:               - Normal esophagus.                           - Gastric antral vascular ectasia. Suspected -                            likely contributor/source of chronic iron-def anemia                           - Portal hypertensive gastropathy.                           -  The examination was otherwise normal.                           - No specimens collected. Recommendation:           - Patient has a contact number available for                            emergencies. The signs and symptoms of potential                            delayed complications were discussed with the                            patient. Return to normal activities tomorrow.                            Written discharge instructions were provided to the                            patient.                           - Resume previous diet.                           - Continue present medications.                           - Repeat upper endoscopy in 1 year for screening                            purposes.                           - Perform a RUQ ultrasound at appointment to be                            scheduled. MY OFFICE WILL  SCHEDULE RUQ [place] - F/U                            CIRRHOSIS Gatha Mayer, MD 09/09/2017 10:36:15 AM This report has been signed electronically.

## 2017-09-09 NOTE — Patient Instructions (Addendum)
Things look the same which is good.  No varices seen. Varices are enlarged veins in the esophagus that can be seen with cirrhosis.  Some changes in the stomach - portal gastropathy and antral vascular ectasia - you leak some blood from here and that is why you need the iron. Usually do not have major hemorrhage from this.  My office will set up an ultrasound of the liver. You should hear by next week - if not please call us.   I appreciate the opportunity to care for you. Gatha Mayer, MD, FACG   YOU HAD AN ENDOSCOPIC PROCEDURE TODAY AT [facility]:   Refer to the procedure report that was given to you for any specific questions about what was found during the examination.  If the procedure report does not answer your questions, please call your gastroenterologist to clarify.  If you requested that your care partner not be given the details of your procedure findings, then the procedure report has been included in a sealed envelope for you to review at your convenience later.  YOU SHOULD EXPECT: Some feelings of bloating in the abdomen. Passage of more gas than usual.  Walking can help get rid of the air that was put into your GI tract during the procedure and reduce the bloating. If you had a lower endoscopy (such as a colonoscopy or flexible sigmoidoscopy) you may notice spotting of blood in your stool or on the toilet paper. If you underwent a bowel prep for your procedure, you may not have a normal bowel movement for a few days.  Please Note:  You might notice some irritation and congestion in your nose or some drainage.  This is from the oxygen used during your procedure.  There is no need for concern and it should clear up in a day or so.  SYMPTOMS TO REPORT IMMEDIATELY:     Following upper endoscopy (EGD)  Vomiting of blood or coffee ground material  New chest pain or pain under the shoulder blades  Painful or persistently difficult swallowing  New  shortness of breath  Fever of 100F or higher  Black, tarry-looking stools  For urgent or emergent issues, a gastroenterologist can be reached at any hour by calling [phone].   DIET:  We do recommend a small meal at first, but then you may proceed to your regular diet.  Drink plenty of fluids but you should avoid alcoholic beverages for 24 hours.  ACTIVITY:  You should plan to take it easy for the rest of today and you should NOT DRIVE or use heavy machinery until tomorrow (because of the sedation medicines used during the test).    FOLLOW UP: Our staff will call the number listed on your records the next business day following your procedure to check on you and address any questions or concerns that you may have regarding the information given to you following your procedure. If we do not reach you, we will leave a message.  However, if you are feeling well and you are not experiencing any problems, there is no need to return our call.  We will assume that you have returned to your regular daily activities without incident.  If any biopsies were taken you will be contacted by phone or by letter within the next 1-3 weeks.  Please call us at [phone] if you have not heard about the biopsies in 3 weeks.    SIGNATURES/CONFIDENTIALITY: You and/or your care partner  have signed paperwork which will be entered into your electronic medical record.  These signatures attest to the fact that that the information above on your After Visit Summary has been reviewed and is understood.  Full responsibility of the confidentiality of this discharge information lies with you and/or your care-partner.

## 2017-09-10 ENCOUNTER — Telehealth: Payer: Self-pay

## 2017-09-10 NOTE — Telephone Encounter (Signed)
Called [phone] and left a messaged we tried to reach pt for a follow up call. maw

## 2017-09-10 NOTE — Telephone Encounter (Signed)
  Follow up Call-  Call back number 09/09/2017 08/02/2016  Post procedure Call Back phone  # [phone]  Permission to leave phone message Yes Yes  Some recent data might be hidden    No answer.  Reached voicemail. Samika Vetsch/2nd attempt at call-back

## 2017-09-11 ENCOUNTER — Other Ambulatory Visit: Payer: Self-pay | Admitting: Gastroenterology

## 2017-09-11 ENCOUNTER — Telehealth: Payer: Self-pay

## 2017-09-11 DIAGNOSIS — K7460 Unspecified cirrhosis of liver: Secondary | ICD-10-CM

## 2017-09-11 NOTE — Telephone Encounter (Signed)
Lm on vm for patient to call and reschedule abdominal u/s in [facility], per her request.  Left [place] phone number on vm.

## 2017-09-12 ENCOUNTER — Other Ambulatory Visit: Payer: Self-pay | Admitting: Family Medicine

## 2017-09-12 DIAGNOSIS — F411 Generalized anxiety disorder: Secondary | ICD-10-CM

## 2017-09-13 NOTE — Telephone Encounter (Signed)
Patient requesting refill on LORazepam (ATIVAN) 0.5 MG tablet. Sent to [facility]. Advised patient that Dr. was seeing patients today & nurse will asap.

## 2017-09-13 NOTE — Telephone Encounter (Signed)
Database ran and is on your desk for review.  Last filled per database: 06/11/17 Last written: 06/10/17 Last ov: 04/26/17 Next ov: none Contract: 04/27/18 UDS: 04/27/18

## 2017-09-16 ENCOUNTER — Ambulatory Visit (HOSPITAL_COMMUNITY): Admission: RE | Admit: 2017-09-16 | Payer: Medicare HMO | Source: Ambulatory Visit

## 2017-09-24 ENCOUNTER — Inpatient Hospital Stay: Payer: Medicare HMO

## 2017-09-24 ENCOUNTER — Inpatient Hospital Stay: Payer: Medicare HMO | Attending: Family | Admitting: Hematology & Oncology

## 2017-09-25 ENCOUNTER — Other Ambulatory Visit: Payer: Self-pay | Admitting: Family Medicine

## 2017-09-30 DIAGNOSIS — E039 Hypothyroidism, unspecified: Secondary | ICD-10-CM | POA: Diagnosis not present

## 2017-09-30 DIAGNOSIS — I10 Essential (primary) hypertension: Secondary | ICD-10-CM | POA: Diagnosis not present

## 2017-09-30 DIAGNOSIS — E1165 Type 2 diabetes mellitus with hyperglycemia: Secondary | ICD-10-CM | POA: Diagnosis not present

## 2017-09-30 DIAGNOSIS — E7800 Pure hypercholesterolemia, unspecified: Secondary | ICD-10-CM | POA: Diagnosis not present

## 2017-09-30 DIAGNOSIS — Z23 Encounter for immunization: Secondary | ICD-10-CM | POA: Diagnosis not present

## 2017-10-04 ENCOUNTER — Other Ambulatory Visit: Payer: Self-pay | Admitting: Family Medicine

## 2017-10-04 DIAGNOSIS — M170 Bilateral primary osteoarthritis of knee: Secondary | ICD-10-CM

## 2017-10-21 DIAGNOSIS — H0100A Unspecified blepharitis right eye, upper and lower eyelids: Secondary | ICD-10-CM | POA: Diagnosis not present

## 2017-10-21 DIAGNOSIS — H02834 Dermatochalasis of left upper eyelid: Secondary | ICD-10-CM | POA: Diagnosis not present

## 2017-10-21 DIAGNOSIS — E119 Type 2 diabetes mellitus without complications: Secondary | ICD-10-CM | POA: Diagnosis not present

## 2017-10-21 DIAGNOSIS — H401131 Primary open-angle glaucoma, bilateral, mild stage: Secondary | ICD-10-CM | POA: Diagnosis not present

## 2017-10-21 DIAGNOSIS — H0100B Unspecified blepharitis left eye, upper and lower eyelids: Secondary | ICD-10-CM | POA: Diagnosis not present

## 2017-10-21 DIAGNOSIS — H2513 Age-related nuclear cataract, bilateral: Secondary | ICD-10-CM | POA: Diagnosis not present

## 2017-10-21 DIAGNOSIS — H02831 Dermatochalasis of right upper eyelid: Secondary | ICD-10-CM | POA: Diagnosis not present

## 2017-10-21 DIAGNOSIS — H25013 Cortical age-related cataract, bilateral: Secondary | ICD-10-CM | POA: Diagnosis not present

## 2017-10-21 DIAGNOSIS — Z7984 Long term (current) use of oral hypoglycemic drugs: Secondary | ICD-10-CM | POA: Diagnosis not present

## 2017-10-21 DIAGNOSIS — H35363 Drusen (degenerative) of macula, bilateral: Secondary | ICD-10-CM | POA: Diagnosis not present

## 2017-10-24 DIAGNOSIS — Z85828 Personal history of other malignant neoplasm of skin: Secondary | ICD-10-CM | POA: Diagnosis not present

## 2017-10-24 DIAGNOSIS — L821 Other seborrheic keratosis: Secondary | ICD-10-CM | POA: Diagnosis not present

## 2017-10-24 DIAGNOSIS — Z08 Encounter for follow-up examination after completed treatment for malignant neoplasm: Secondary | ICD-10-CM | POA: Diagnosis not present

## 2017-10-24 DIAGNOSIS — L720 Epidermal cyst: Secondary | ICD-10-CM | POA: Diagnosis not present

## 2017-10-24 DIAGNOSIS — L578 Other skin changes due to chronic exposure to nonionizing radiation: Secondary | ICD-10-CM | POA: Diagnosis not present

## 2017-10-24 DIAGNOSIS — L570 Actinic keratosis: Secondary | ICD-10-CM | POA: Diagnosis not present

## 2017-11-18 ENCOUNTER — Encounter: Payer: Self-pay | Admitting: Internal Medicine

## 2017-11-19 ENCOUNTER — Telehealth: Payer: Self-pay

## 2017-11-19 DIAGNOSIS — K7031 Alcoholic cirrhosis of liver with ascites: Secondary | ICD-10-CM

## 2017-11-19 NOTE — Telephone Encounter (Signed)
May I refill her furosemide Sir?

## 2017-11-20 MED ORDER — FUROSEMIDE 20 MG PO TABS: 20.0000 mg | ORAL_TABLET | Freq: Every day | ORAL | 1 refills | Status: DC

## 2017-11-20 NOTE — Telephone Encounter (Signed)
Left message on son Gershon Mussel) voice mail to call me back. Looks like she no showed her August U/S appointment. Need to set this up and order the labs.

## 2017-11-20 NOTE — Telephone Encounter (Signed)
We can refill it at same sig and for 2 months  She needs: 1) Complete the ultrasound we have ordered already 2) CBC, CMET, INR this week dx alcoholic cirrhosis

## 2017-11-20 NOTE — Telephone Encounter (Signed)
I spoke with Jessica Dennis and she wants me to try and set the U/S up to be done in [facility]. I will call tomorrow and do this. She is aware she needs lab work done and I told her we are refilling her medicine. I confirmed with her what dose she is on of her generic lasix.

## 2017-11-21 NOTE — Telephone Encounter (Signed)
I left her a message to call me back to make sure she knows about her U/S appointment and to make her a f/u appointment with Dr Carlean Purl.

## 2017-11-21 NOTE — Telephone Encounter (Signed)
Pt returned your call. I scheduled her for ov with Dr. Carlean Purl on 12/31/17.

## 2017-11-22 NOTE — Telephone Encounter (Signed)
Per Tye [place] patient is aware of her U/S appointment date/time.

## 2017-11-25 ENCOUNTER — Other Ambulatory Visit (INDEPENDENT_AMBULATORY_CARE_PROVIDER_SITE_OTHER): Payer: Medicare HMO

## 2017-11-25 DIAGNOSIS — K7031 Alcoholic cirrhosis of liver with ascites: Secondary | ICD-10-CM

## 2017-11-25 DIAGNOSIS — R69 Illness, unspecified: Secondary | ICD-10-CM | POA: Diagnosis not present

## 2017-11-25 LAB — CBC WITH DIFFERENTIAL/PLATELET
Basophils Absolute: 0 10*3/uL (ref 0.0–0.1)
Basophils Relative: 0.7 % (ref 0.0–3.0)
Eosinophils Absolute: 0.1 10*3/uL (ref 0.0–0.7)
Eosinophils Relative: 1.5 % (ref 0.0–5.0)
HCT: 29.9 % — ABNORMAL LOW (ref 36.0–46.0)
Hemoglobin: 9.7 g/dL — ABNORMAL LOW (ref 12.0–15.0)
Lymphocytes Relative: 10.7 % — ABNORMAL LOW (ref 12.0–46.0)
Lymphs Abs: 0.6 10*3/uL — ABNORMAL LOW (ref 0.7–4.0)
MCHC: 32.4 g/dL (ref 30.0–36.0)
MCV: 82.9 fl (ref 78.0–100.0)
Monocytes Absolute: 0.3 10*3/uL (ref 0.1–1.0)
Monocytes Relative: 6.3 % (ref 3.0–12.0)
Neutro Abs: 4.2 10*3/uL (ref 1.4–7.7)
Neutrophils Relative %: 80.8 % — ABNORMAL HIGH (ref 43.0–77.0)
Platelets: 72 10*3/uL — ABNORMAL LOW (ref 150.0–400.0)
RBC: 3.61 Mil/uL — ABNORMAL LOW (ref 3.87–5.11)
RDW: 15.1 % (ref 11.5–15.5)
WBC: 5.3 10*3/uL (ref 4.0–10.5)

## 2017-11-25 LAB — COMPREHENSIVE METABOLIC PANEL
ALT: 18 U/L (ref 0–35)
AST: 24 U/L (ref 0–37)
Albumin: 4.2 g/dL (ref 3.5–5.2)
Alkaline Phosphatase: 90 U/L (ref 39–117)
BUN: 25 mg/dL — ABNORMAL HIGH (ref 6–23)
CO2: 22 mEq/L (ref 19–32)
Calcium: 9.4 mg/dL (ref 8.4–10.5)
Chloride: 102 mEq/L (ref 96–112)
Creatinine, Ser: 1.32 mg/dL — ABNORMAL HIGH (ref 0.40–1.20)
GFR: 40.86 mL/min — ABNORMAL LOW (ref >60.00)
Glucose, Bld: 328 mg/dL — ABNORMAL HIGH (ref 70–99)
Potassium: 5.5 mEq/L — ABNORMAL HIGH (ref 3.5–5.1)
Sodium: 133 mEq/L — ABNORMAL LOW (ref 135–145)
Total Bilirubin: 1 mg/dL (ref 0.2–1.2)
Total Protein: 6.7 g/dL (ref 6.0–8.3)

## 2017-11-25 LAB — PROTIME-INR
INR: 1.2 ratio — ABNORMAL HIGH (ref 0.8–1.0)
Prothrombin Time: 13.8 s — ABNORMAL HIGH (ref 9.6–13.1)

## 2017-11-26 NOTE — Progress Notes (Signed)
Labs stable to improved She is anemic and long hx low iron She needs to be taking ferrous sulfate 325 mg twice a day or at least 1x a day Let me know if there are problems with that for her  She will build her Hgb and feel better if she does that (or should)

## 2017-11-27 ENCOUNTER — Ambulatory Visit (HOSPITAL_BASED_OUTPATIENT_CLINIC_OR_DEPARTMENT_OTHER)
Admission: RE | Admit: 2017-11-27 | Discharge: 2017-11-27 | Disposition: A | Discharge status: Home / Self Care | Payer: Medicare HMO | Source: Ambulatory Visit | Attending: Internal Medicine | Admitting: Internal Medicine

## 2017-11-27 DIAGNOSIS — K7031 Alcoholic cirrhosis of liver with ascites: Secondary | ICD-10-CM | POA: Insufficient documentation

## 2017-11-27 DIAGNOSIS — R69 Illness, unspecified: Secondary | ICD-10-CM | POA: Diagnosis not present

## 2017-11-27 IMAGING — US US CAROTID DUPLEX BILAT
1 series · 13 of 24 positions shown · non-contrast
Comparison: 03/18/2012 CT neck with contrast

CLINICAL DATA: Bilateral asymptomatic carotid bruits, hypertension,
diabetes

EXAM:
BILATERAL CAROTID DUPLEX ULTRASOUND
TECHNIQUE: Gray scale imaging, color Doppler and duplex ultrasound were
performed of bilateral carotid and vertebral arteries in the neck.

[Series 1: us carotid duplex bilat · 0.07mm/px · 13 of 73 slices shown]
[im 1/73]
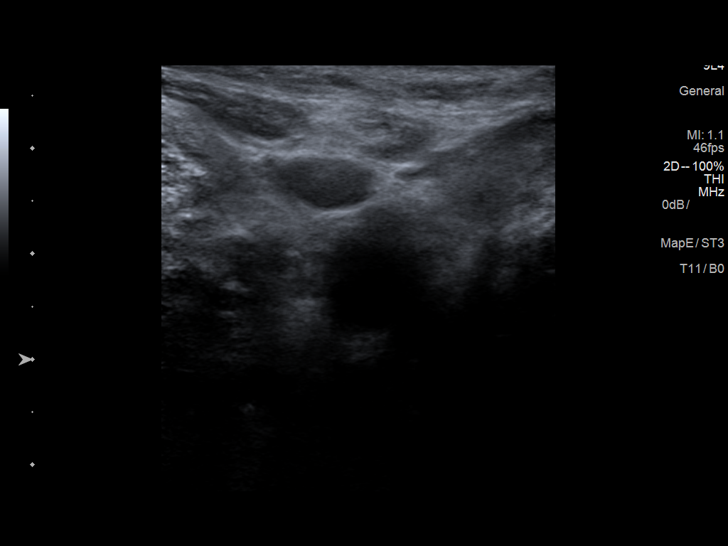
[im 7/73]
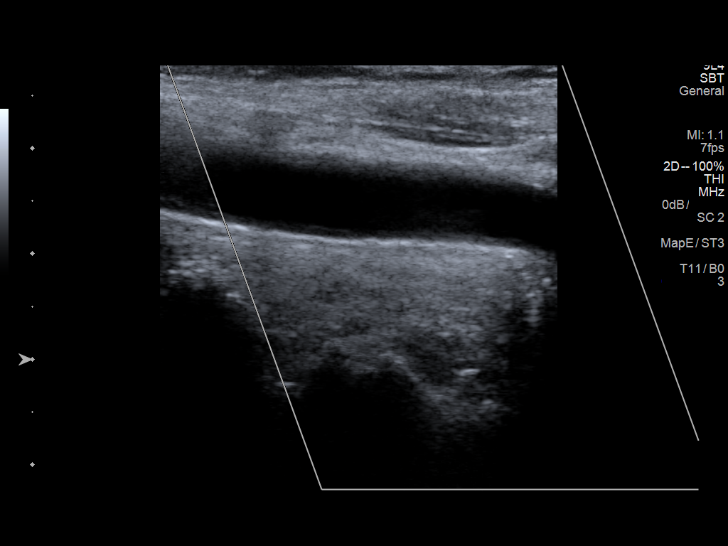
[im 13/73]
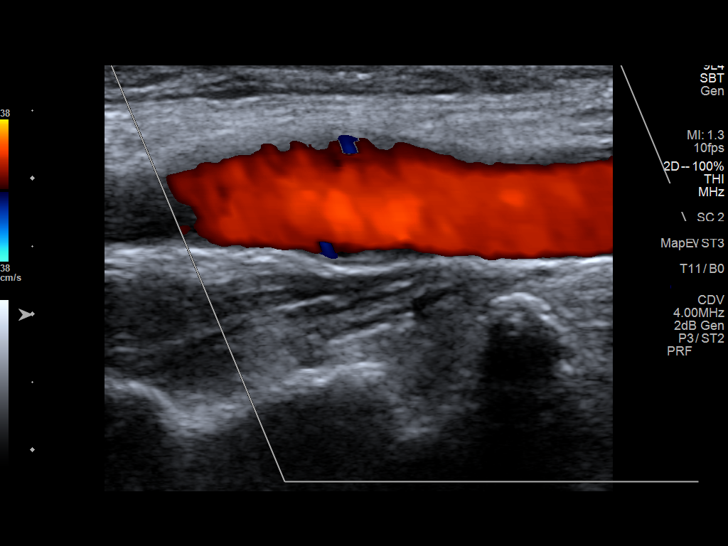
[im 19/73]
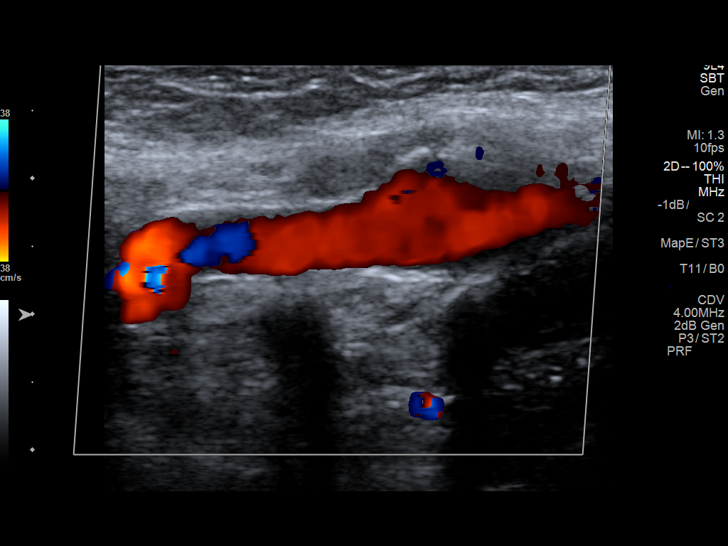
[im 26/73]
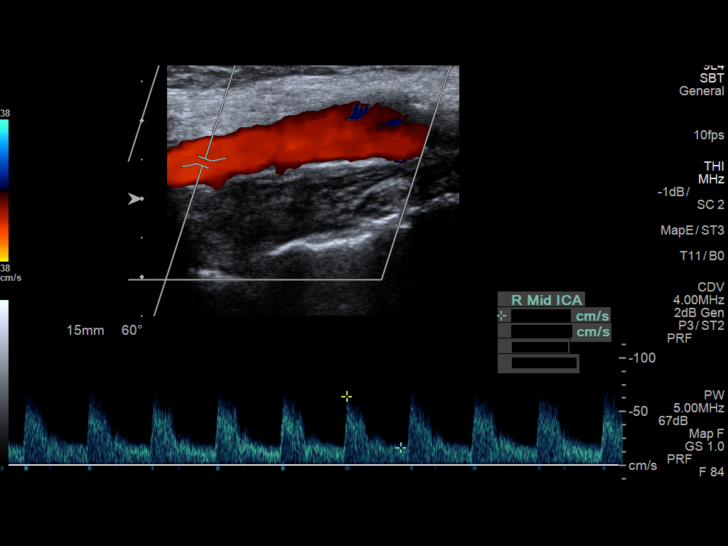
[im 32/73]
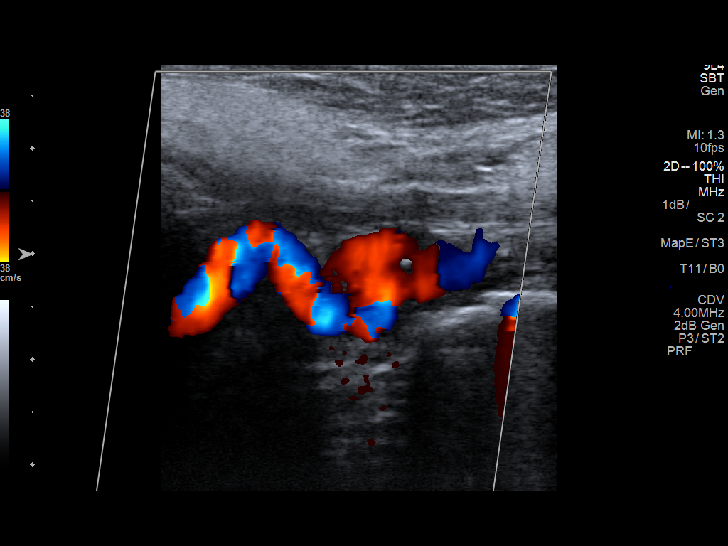
[im 38/73]
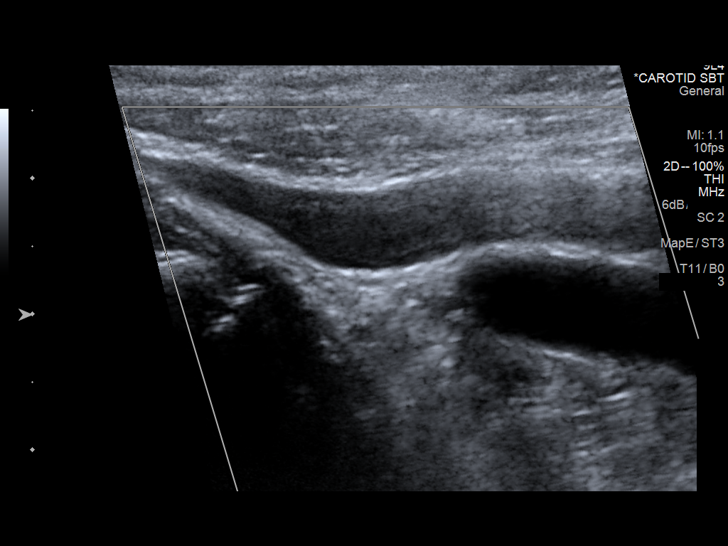
[im 41/73]
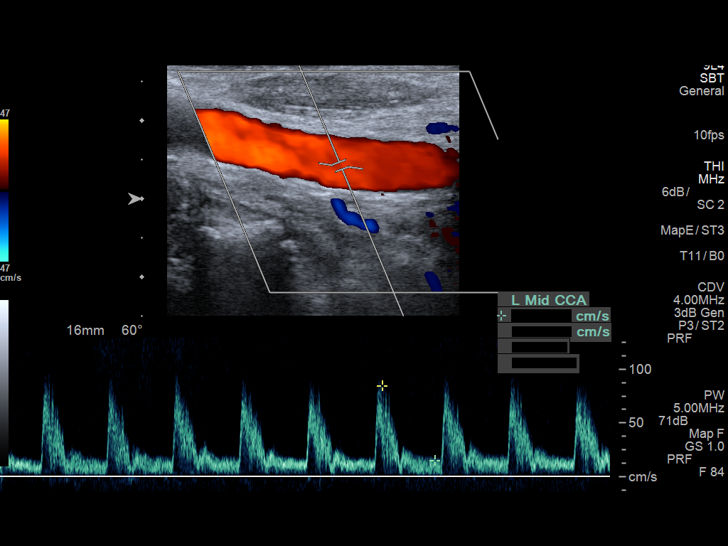
[im 47/73]
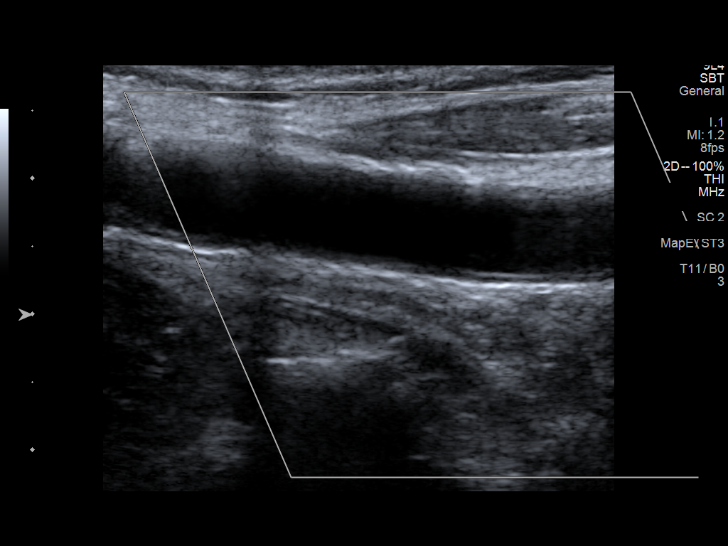
[im 54/73]
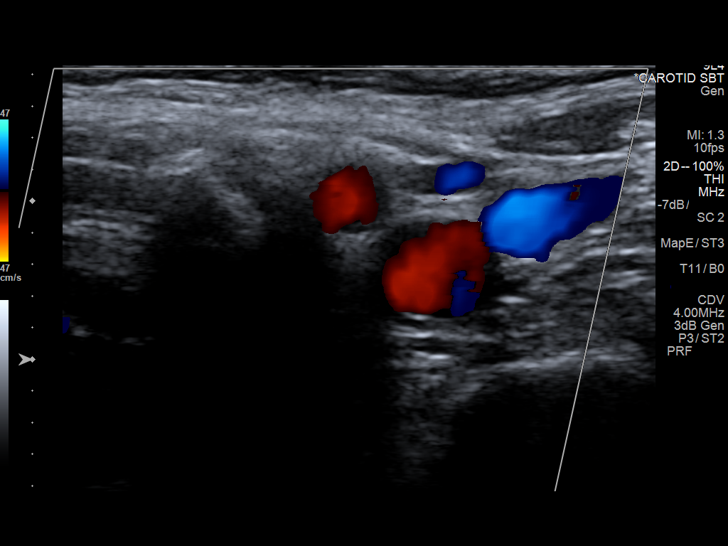
[im 60/73]
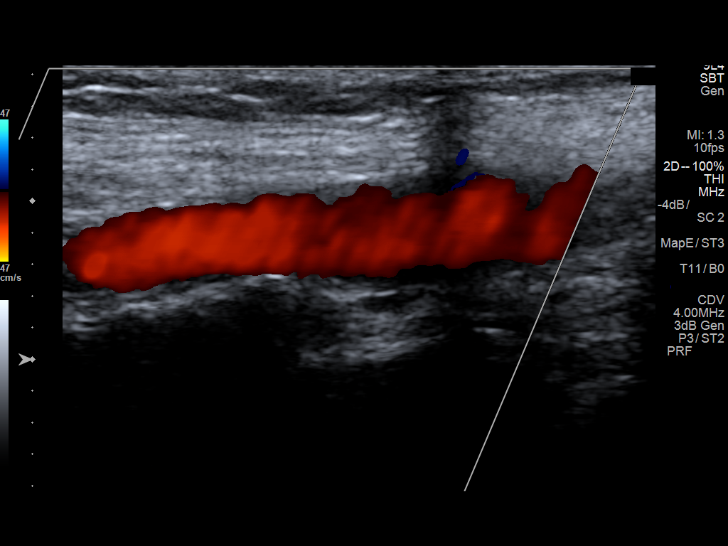
[im 66/73]
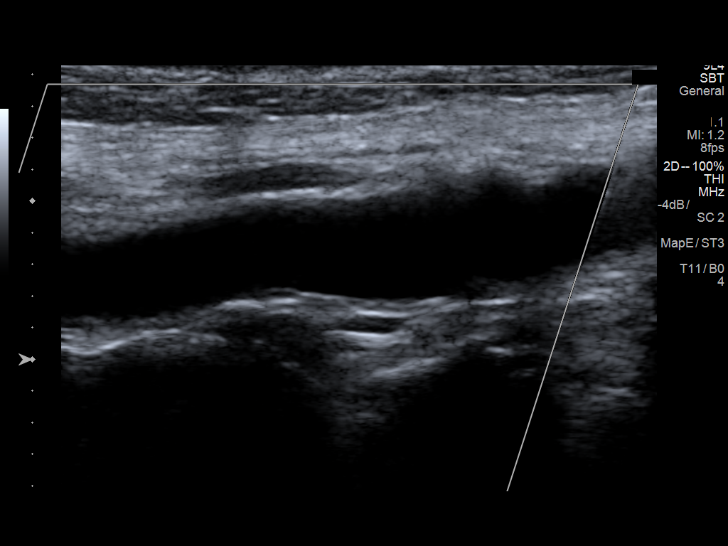
[im 73/73]
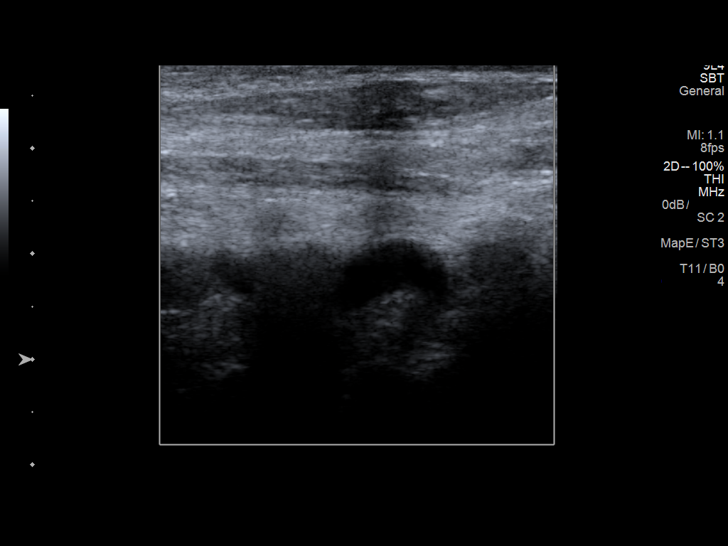

[13 of 24 positions shown; findings below may reference images not displayed]

FINDINGS: Criteria: Quantification of carotid stenosis is based on velocity
parameters that correlate the residual internal carotid diameter
with NASCET-based stenosis levels, using the diameter of the distal
internal carotid lumen as the denominator for stenosis measurement.

The following velocity measurements were obtained:

RIGHT

ICA:  96/24 cm/sec

CCA:  100/19 cm/sec

SYSTOLIC ICA/CCA RATIO:

DIASTOLIC ICA/CCA RATIO:

ECA:  75 cm/sec

LEFT

ICA:  90/28 cm/sec

CCA:  87/11 cm/sec

SYSTOLIC ICA/CCA RATIO:

DIASTOLIC ICA/CCA RATIO:

ECA:  82 cm/sec

RIGHT CAROTID ARTERY: Minor echogenic shadowing plaque formation.
Right distal ICA is tortuous. No hemodynamically significant right
ICA stenosis, velocity elevation, or turbulent flow. Degree of
narrowing less than 50%.

RIGHT VERTEBRAL ARTERY:  Antegrade

LEFT CAROTID ARTERY: Similar scattered minor echogenic plaque
formation. No hemodynamically significant left ICA stenosis,
velocity elevation, or turbulent flow.

LEFT VERTEBRAL ARTERY:  Antegrade
IMPRESSION: Minor carotid atherosclerosis. No hemodynamically significant ICA
stenosis. Degree of narrowing less than 50% bilaterally.

Patent antegrade vertebral flow bilaterally

## 2017-11-28 IMAGING — US US TRANSVAGINAL NON-OB
1 series · 14 of 22 positions shown · non-contrast
Comparison: None

CLINICAL DATA: Pelvic distention

EXAM:
TRANSABDOMINAL AND TRANSVAGINAL ULTRASOUND OF PELVIS
TECHNIQUE: Study was performed transabdominally to optimize pelvic field of
view evaluation and transvaginally to optimize internal visceral
architecture evaluation.

[Series 1: us transvaginal non-ob · 0.25mm/px · 14 of 22 slices shown]
[im 1/22]
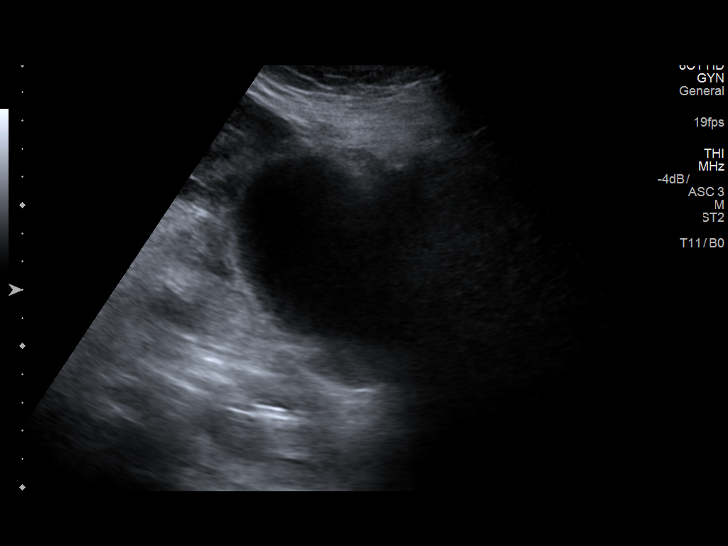
[im 3/22]
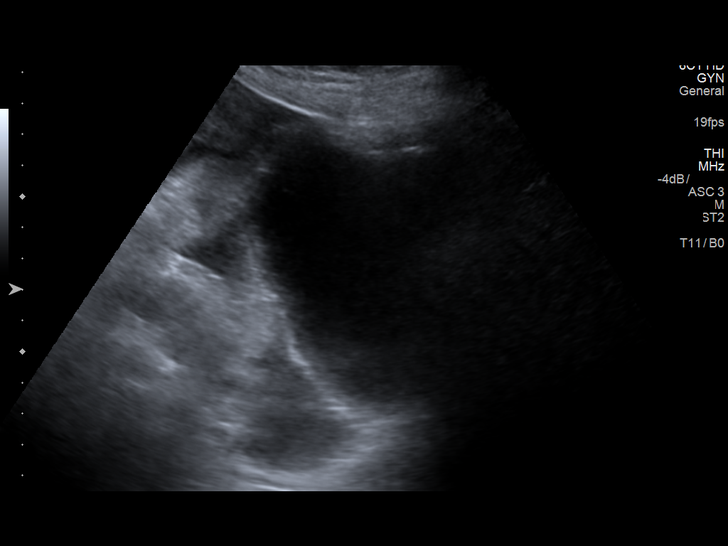
[im 4/22]
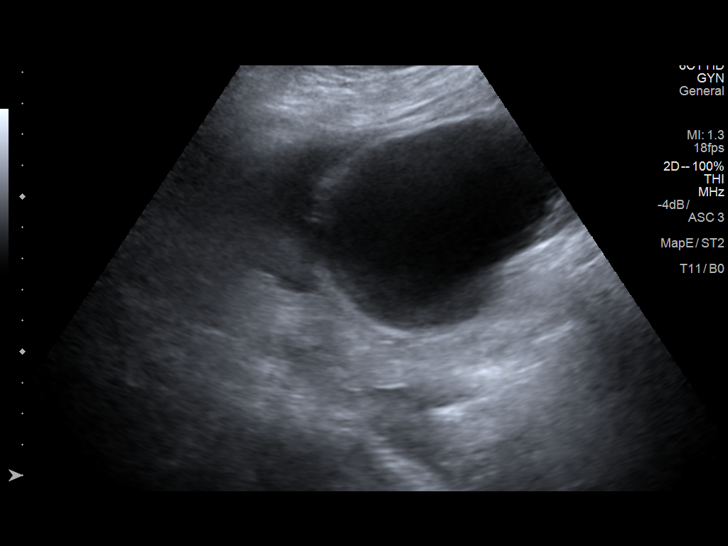
[im 6/22]
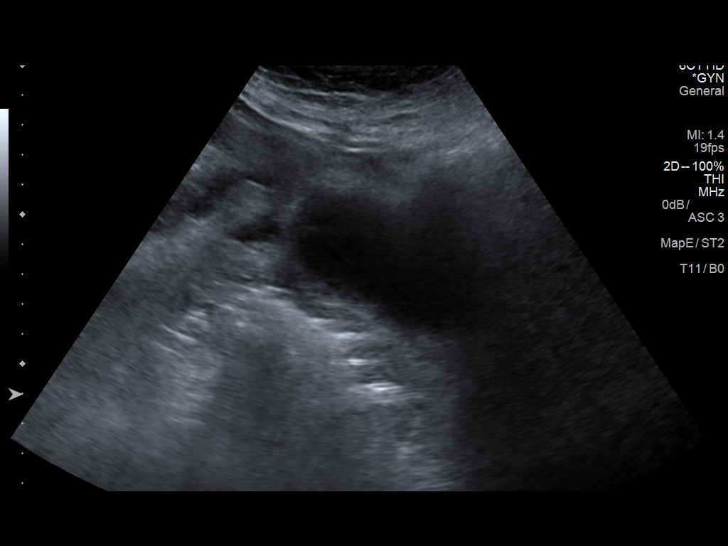
[im 8/22]
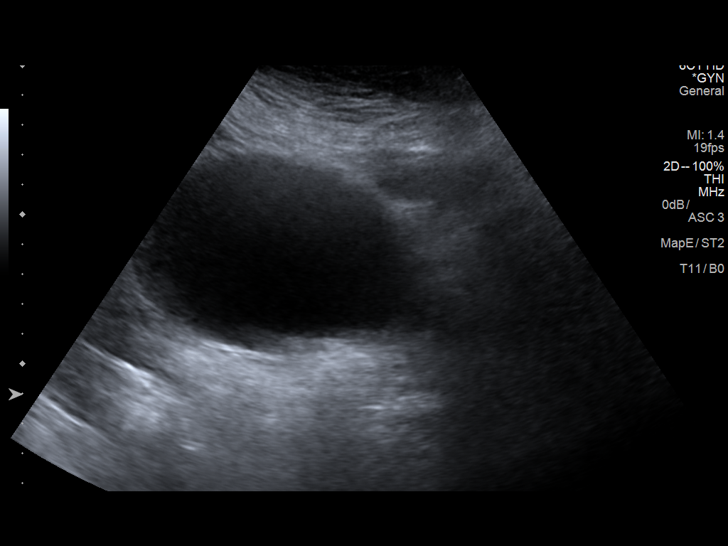
[im 9/22]
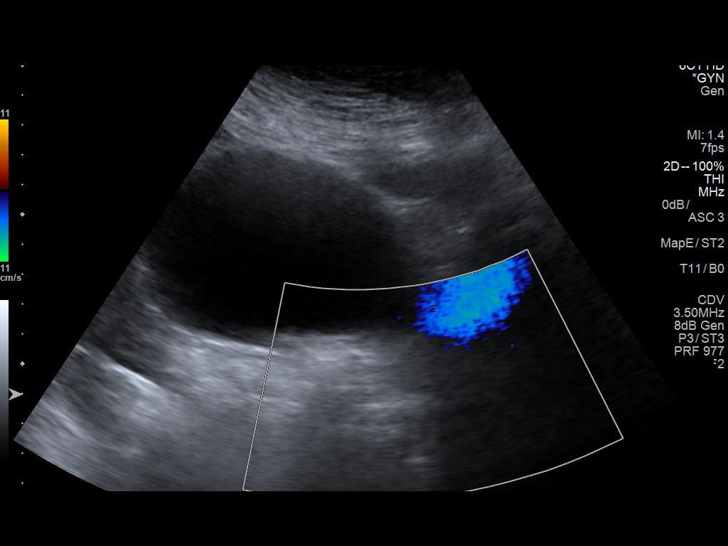
[im 11/22]
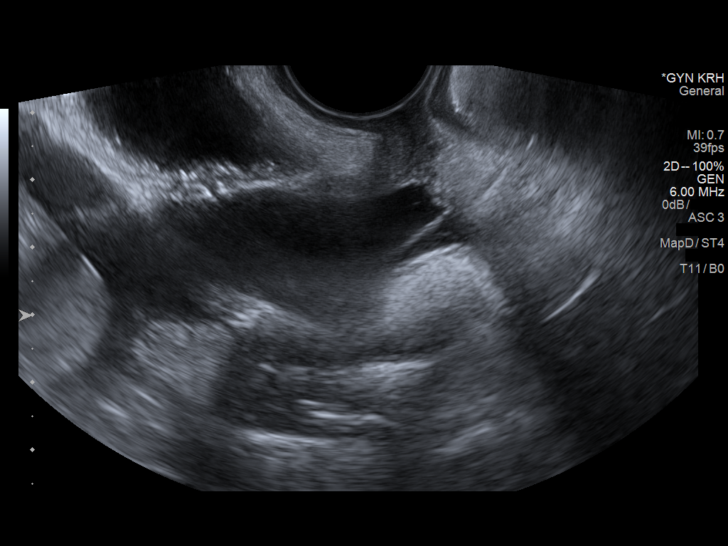
[im 12/22]
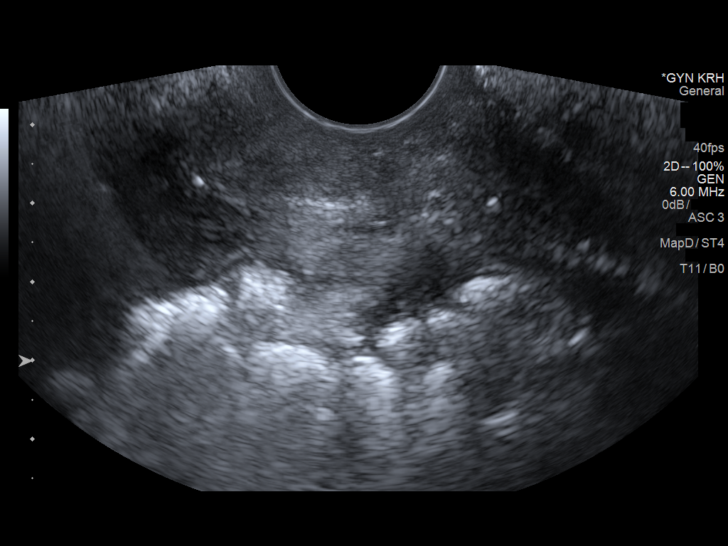
[im 14/22]
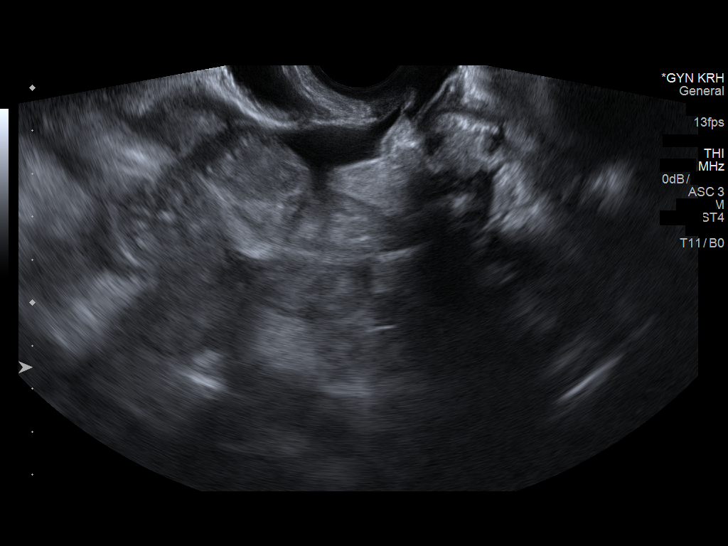
[im 15/22]
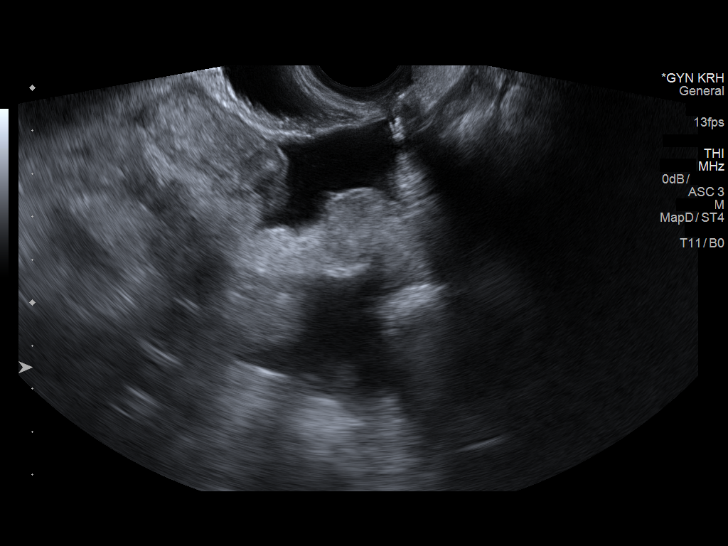
[im 17/22]
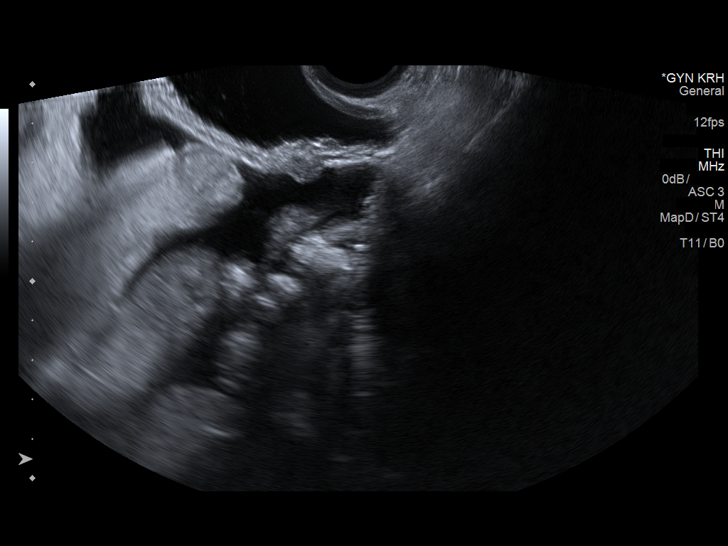
[im 19/22]
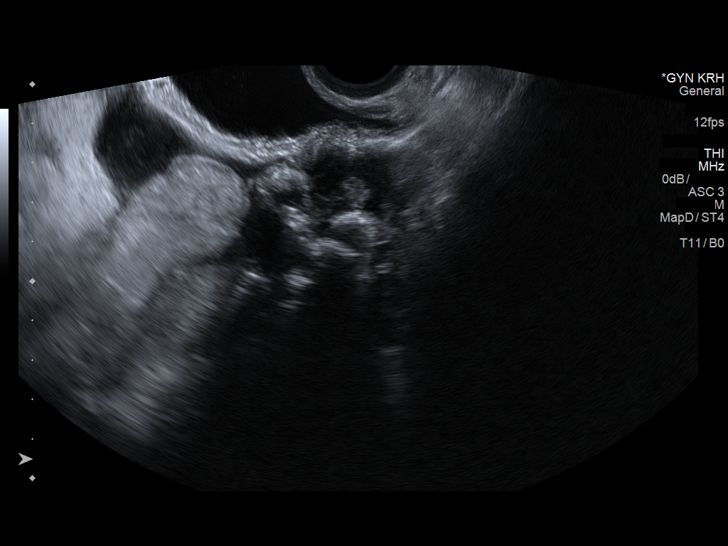
[im 20/22]
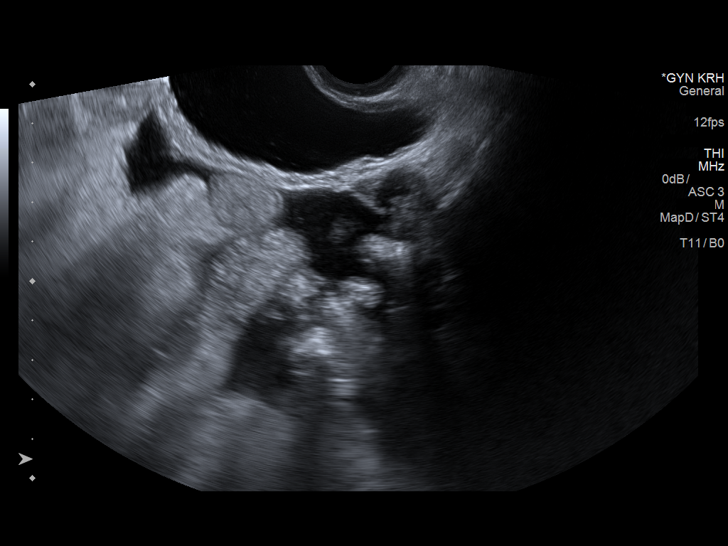
[im 22/22]
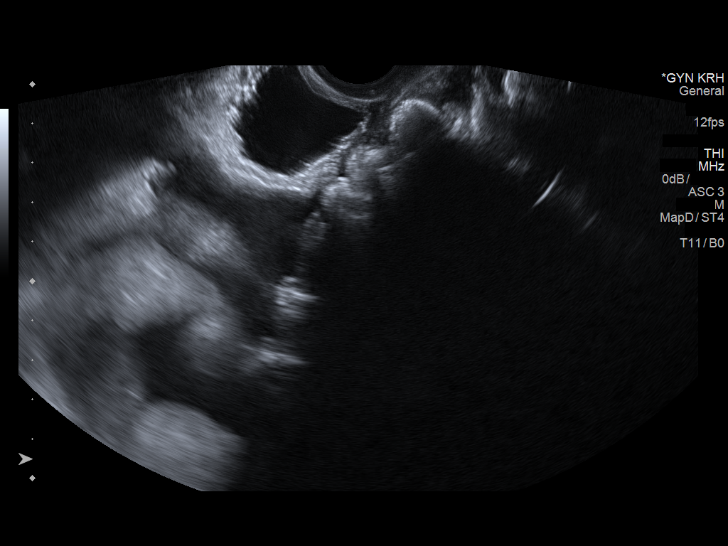

[14 of 22 positions shown; findings below may reference images not displayed]

FINDINGS: Uterus

Surgically absent.

Right ovary

Not visualized by transabdominal or transvaginal technique. No
right-sided pelvic mass evident.

Left ovary

Not visualized by transabdominal or transvaginal technique. No
left-sided pelvic mass evident.

Other findings

There is fairly extensive ascites.
IMPRESSION: Extensive ascites. Etiology uncertain. Patient does have a hepatic
cirrhosis which may account for the ascites.

Uterus absent. Neither ovary visualized, possibly due to
postmenopausal state. There is question of previous oophorectomy as
well.

## 2017-11-28 NOTE — Progress Notes (Signed)
[place] ok My Chart note

## 2017-11-30 ENCOUNTER — Other Ambulatory Visit: Payer: Self-pay | Admitting: Family Medicine

## 2017-11-30 DIAGNOSIS — K219 Gastro-esophageal reflux disease without esophagitis: Secondary | ICD-10-CM

## 2017-12-09 ENCOUNTER — Other Ambulatory Visit: Payer: Self-pay | Admitting: Family Medicine

## 2017-12-09 DIAGNOSIS — F411 Generalized anxiety disorder: Secondary | ICD-10-CM

## 2017-12-10 NOTE — Telephone Encounter (Signed)
Database ran and is on your desk for review.  Last filled per database: 09/13/17 Last written: 09/13/17 Last ov: 06/11/17 Next ov: none Contract: 04/27/18 UDS: 04/27/18

## 2017-12-31 ENCOUNTER — Ambulatory Visit: Payer: Medicare HMO | Admitting: Internal Medicine

## 2018-01-09 ENCOUNTER — Encounter: Payer: Self-pay | Admitting: Medical

## 2018-01-09 ENCOUNTER — Ambulatory Visit (INDEPENDENT_AMBULATORY_CARE_PROVIDER_SITE_OTHER): Payer: Medicare HMO | Admitting: Medical

## 2018-01-09 VITALS — BP 166/70 | HR 105 | Temp 98.3°F | Resp 16 | Ht 65.75 in | Wt 175.2 lb

## 2018-01-09 DIAGNOSIS — M170 Bilateral primary osteoarthritis of knee: Secondary | ICD-10-CM | POA: Diagnosis not present

## 2018-01-09 DIAGNOSIS — R252 Cramp and spasm: Secondary | ICD-10-CM | POA: Diagnosis not present

## 2018-01-09 DIAGNOSIS — M5432 Sciatica, left side: Secondary | ICD-10-CM | POA: Diagnosis not present

## 2018-01-09 MED ORDER — KETOROLAC TROMETHAMINE 30 MG/ML IJ SOLN
30.0000 mg | Freq: Once | INTRAMUSCULAR | Status: AC
  Administered 2018-01-09: 30 mg via INTRAMUSCULAR

## 2018-01-09 MED ORDER — TIZANIDINE HCL 4 MG PO TABS: ORAL_TABLET | ORAL | 1 refills | Status: DC

## 2018-01-09 NOTE — Patient Instructions (Addendum)
You do have lower back pain mostly in the left sciatic area.  We gave you Toradol 30 mg IM injection today.  Not use any over the counter NSAIDs today since you are receiving a Toradol.  Starting tomorrow he can use Tylenol over-the-counter 1 tablet every 8 hours and ibuprofen 200 mg 1 tablet in the morning and another tablet at night.  Prescription of Zanaflex sent to pharmacy just to use at night before sleep.  Rx advisement given.  You bought lidocaine over-the-counter patch recently.  Applied to area daily.  If pain persists then will get x-ray.  Note due to reduced GFR I do want to avoid high dose NSAIDs.  Since diabetic I decided not to give prednisone.  Level of pain persist that might be option but would need to watch your sugar levels very closely.  Follow-up in 7 days or as needed.

## 2018-01-09 NOTE — Progress Notes (Signed)
Pre visit review using our clinic review tool, if applicable. No additional management support is needed unless otherwise documented below in the visit note. 

## 2018-01-09 NOTE — Progress Notes (Signed)
Subjective:    Patient ID: Jessica Dennis, female    DOB: 1932/07/25, 84 y.o.   MRN: [id number]  HPI  Pt in for back pain. Pain for 2 days. No fall or trauma. Pain just noticed when first woke. She states not history of back pain in the past.  Pt states pain left side of back. She points to left si area. Tried warm and cold compresses but no improvement.  Pt tried ibuprofen 200 mg 2-3 tabs but did not help.  Pt is diabetic. On insulin. Her a1c was controlled  8 months ago.  Review of Systems  Constitutional: Negative for chills, diaphoresis and fatigue.  Respiratory: Negative for chest tightness, shortness of breath and wheezing.   Cardiovascular: Negative for chest pain and palpitations.  Gastrointestinal: Negative for abdominal pain.  Musculoskeletal: Positive for back pain.       See hpi. Left si area.  Skin: Negative for rash.  Neurological: Negative for dizziness, weakness, numbness and headaches.  Hematological: Negative for adenopathy. Does not bruise/bleed easily.  Psychiatric/Behavioral: Negative for behavioral problems, confusion and decreased concentration. The patient is not nervous/anxious.     Past Medical History:  Diagnosis Date  . Anemia   . Angiodysplasia of ascending colon 10/25/2014  . Anxiety   . Arthritis   . Borderline diabetes   . Cancer of the skin, basal cell 09/03/2012  . Cirrhosis ([facility])   . Diabetes mellitus without complication ([facility])   . Diverticular disease   . 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 GERD (gastroesophageal reflux disease)   . Hypertension   . Iron deficiency anemia due to chronic blood loss 01/19/2015  . Portal hypertensive gastropathy ([facility]) 08/02/2016   ? Some GAVE also  . Thyroid disease      Social History   Socioeconomic History  . Marital status: Divorced    Spouse name: Not on file  . Number of children: 2  . Years of education: Not on file  . Highest education level: Not on file  Occupational  History  . Occupation: retired  Scientific laboratory technician  . Financial resource strain: Not on file  . Food insecurity:    Worry: Not on file    Inability: Not on file  . Transportation needs:    Medical: Not on file    Non-medical: Not on file  Tobacco Use  . Smoking status: Never Smoker  . Smokeless tobacco: Never Used  . Tobacco comment: NEVER USED TOBACCO  Substance and Sexual Activity  . Alcohol use: No    Alcohol/week: 2.0 standard drinks    Types: 2 Standard drinks or equivalent per week    Comment: rare occassion  . Drug use: No  . Sexual activity: Never  Lifestyle  . Physical activity:    Days per week: Not on file    Minutes per session: Not on file  . Stress: Not on file  Relationships  . Social connections:    Talks on phone: Not on file    Gets together: Not on file    Attends religious service: Not on file    Active member of club or organization: Not on file    Attends meetings of clubs or organizations: Not on file    Relationship status: Not on file  . Intimate partner violence:    Fear of current or ex partner: Not on file    Emotionally abused: Not on file    Physically abused: Not on file    Forced  sexual activity: Not on file  Other Topics Concern  . Not on file  Social History Narrative   The patient is divorced. She is originally from [place]. She has 2 sons. 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   08/10/2014    Past Surgical History:  Procedure Laterality Date  . ABDOMINAL HYSTERECTOMY    . APPENDECTOMY    . COLONOSCOPY    . ESOPHAGOGASTRODUODENOSCOPY    . IR PARACENTESIS  05/25/2016  . LEG SKIN LESION  BIOPSY / EXCISION  12/11/14  . MOHS SURGERY     ankle  . TONSILLECTOMY    . TOTAL HIP ARTHROPLASTY Bilateral 1993, 2006  . UPPER GASTROINTESTINAL ENDOSCOPY      Family History  Problem Relation Age of Onset  . Bladder Cancer Father   . Heart attack Father   . Diabetes Mother   . Colon cancer Neg Hx   . Colon  polyps Neg Hx   . Esophageal cancer Neg Hx   . Rectal cancer Neg Hx   . Stomach cancer Neg Hx     No Known Allergies  Current Outpatient Medications on File Prior to Visit  Medication Sig Dispense Refill  . ACCU-CHEK FASTCLIX LANCETS MISC CHECK BLOOD SUGAR ONCE DAILY. 102 each 6  . Ascorbic Acid (VITAMIN C) 1000 MG tablet Take 1,000 mg by mouth daily.    . Calcium Carbonate-Vitamin D 600-200 MG-UNIT CAPS Take by mouth.    . celecoxib (CELEBREX) 200 MG capsule TAKE 1 CAPSULE (200 MG TOTAL) BY MOUTH 2 (TWO) TIMES DAILY. WITH MEALS    . ferrous sulfate 325 (65 FE) MG tablet Take 325 mg by mouth 2 (two) times daily with a meal.    . Flaxseed, Linseed, (FLAX SEED OIL) 1000 MG CAPS Take by mouth.    . furosemide (LASIX) 20 MG tablet Take 1 tablet (20 mg total) by mouth daily. 30 tablet 1  . glucose blood test strip Accu-Chek [employer]    . Insulin Aspart Prot & Aspart (NOVOLOG MIX 70/30 [place]) Inject into the skin. 35 units in the mornings and 25 at night    . latanoprost (XALATAN) 0.005 % ophthalmic solution Place 1 drop into both eyes at bedtime.     Marland Kitchen levothyroxine (SYNTHROID, LEVOTHROID) 75 MCG tablet Take 1 tablet (75 mcg total) by mouth daily. Needs ov 90 tablet 0  . LORazepam (ATIVAN) 0.5 MG tablet TAKE ONE TABLET BY MOUTH DAILY AS NEEDED 90 tablet 0  . Melatonin 5 MG CAPS Take 5 mg by mouth.    . meloxicam (MOBIC) 7.5 MG tablet TAKE ONE TABLET BY MOUTH TWICE A DAY 180 tablet 0  . Omega-3 Fatty Acids (FISH OIL) 1000 MG CAPS Take by mouth every morning.    . pantoprazole (PROTONIX) 40 MG tablet Take 1 tablet (40 mg total) by mouth daily. Needs ov 90 tablet 0  . Potassium Chloride ER 20 MEQ TBCR TAKE ONE TABLET BY MOUTH DAILY 90 tablet 1  . Prednicarbate 0.1 % CREA Apply topically 2 (two) times daily.     . simvastatin (ZOCOR) 20 MG tablet Take 1 tablet (20 mg total) by mouth at bedtime. Needs ov 90 tablet 0  . spironolactone (ALDACTONE) 50 MG tablet Take 1 tablet (50 mg total) by  mouth daily. 60 tablet 5  . vitamin E 100 UNIT capsule Take by mouth daily.    Marland Kitchen tiZANidine (ZANAFLEX) 4 MG tablet 1 po qhs prn leg cramps (Patient not  taking: Reported on 09/09/2017) 15 tablet 1   No current facility-administered medications on file prior to visit.     BP (!) 166/70 (BP Location: Right Arm, Patient Position: Sitting, Cuff Size: Small)   Pulse (!) 105   Temp 98.3 F (36.8 C) (Oral)   Resp 16   Ht 5' 5.75" (1.67 m)   Wt 175 lb 4 oz (79.5 kg)   SpO2 100%   BMI 28.50 kg/m       Objective:   Physical Exam  General Appearance- Not in acute distress.    Chest and Lung Exam Auscultation: Breath sounds:-Normal. Clear even and unlabored. Adventitious sounds:- No Adventitious sounds.  Cardiovascular Auscultation:Rythm - Regular, rate and rythm. Heart Sounds -Normal heart sounds.  Abdomen Inspection:-Inspection Normal.  Palpation/Perucssion: Palpation and Percussion of the abdomen reveal- Non Tender, No Rebound tenderness, No rigidity(Guarding) and No Palpable abdominal masses.  Liver:-Normal.  Spleen:- Normal.   Back No mis  lumbar spine tenderness to palpation.(lt si tenderness to palpation) Pain on straight leg lift. Pain on lateral movements and flexion/extension of the spine.  Lower ext neurologic  L5-S1 sensation intact bilaterally. Normal patellar reflexes bilaterally. No foot drop bilaterally.      Assessment & Plan:  You do have lower back pain mostly in the left sciatic area.  We gave you Toradol 30 mg IM injection today.  Not use any over the counter NSAIDs today since you are receiving a Toradol.  Starting tomorrow he can use Tylenol over-the-counter 1 tablet every 8 hours and ibuprofen 200 mg 1 tablet in the morning and another tablet at night.  Prescription of Zanaflex sent to pharmacy just to use at night before sleep.  Rx advisement given.  You bought lidocaine over-the-counter patch recently.  Applied to area daily.  If pain  persists then will get x-ray.  Note due to reduced GFR I do want to avoid high dose NSAIDs.  Since diabetic I decided not to give prednisone.  Level of pain persist that might be option but would need to watch your sugar levels very closely.  Follow-up in 7 days or as needed.  Mackie Pai, PA-C

## 2018-01-11 ENCOUNTER — Other Ambulatory Visit: Payer: Self-pay | Admitting: Family Medicine

## 2018-01-14 DIAGNOSIS — M5417 Radiculopathy, lumbosacral region: Secondary | ICD-10-CM | POA: Diagnosis not present

## 2018-01-14 DIAGNOSIS — M545 Low back pain: Secondary | ICD-10-CM | POA: Diagnosis not present

## 2018-01-23 ENCOUNTER — Other Ambulatory Visit: Payer: Self-pay | Admitting: Internal Medicine

## 2018-01-23 DIAGNOSIS — K7031 Alcoholic cirrhosis of liver with ascites: Secondary | ICD-10-CM

## 2018-01-23 NOTE — Telephone Encounter (Signed)
OK to refill x 2  I also want her to come in and do labs  CBC Ferritin CMET INR  Have her be seen in f/u by an APP - tell her we need to check on her and I am not available   dx Cirrhosis

## 2018-01-23 NOTE — Telephone Encounter (Signed)
Spoke with Trudi and she said she will come next Monday for the lab work and then see Ellouise Newer PA-C on 01/31/2018 at 11:15AM.  Spironolactone refilled as approved.

## 2018-01-23 NOTE — Telephone Encounter (Signed)
Please advise Sir? 

## 2018-01-24 ENCOUNTER — Telehealth: Payer: Self-pay

## 2018-01-24 NOTE — Telephone Encounter (Signed)
I have left her messages on both her home and cell that we are cancelling her Ellouise Newer PA-C appointment for 01/31/2018 because she was already on the books for Dr Carlean Purl for 02/07/2018. I left message that she can come for her labs either next week or week after.

## 2018-01-31 ENCOUNTER — Ambulatory Visit: Payer: Medicare HMO | Admitting: Physician Assistant

## 2018-02-03 ENCOUNTER — Encounter: Payer: Self-pay | Admitting: Medical

## 2018-02-03 ENCOUNTER — Other Ambulatory Visit: Payer: Self-pay | Admitting: Medical

## 2018-02-03 ENCOUNTER — Ambulatory Visit (INDEPENDENT_AMBULATORY_CARE_PROVIDER_SITE_OTHER): Payer: Medicare HMO | Admitting: Medical

## 2018-02-03 VITALS — BP 120/60 | HR 74 | Temp 97.9°F | Resp 16 | Ht 65.0 in | Wt 172.6 lb

## 2018-02-03 DIAGNOSIS — E10628 Type 1 diabetes mellitus with other skin complications: Secondary | ICD-10-CM

## 2018-02-03 DIAGNOSIS — S91301A Unspecified open wound, right foot, initial encounter: Secondary | ICD-10-CM

## 2018-02-03 DIAGNOSIS — R252 Cramp and spasm: Secondary | ICD-10-CM

## 2018-02-03 MED ORDER — DOXYCYCLINE HYCLATE 100 MG PO TABS: 100.0000 mg | ORAL_TABLET | Freq: Two times a day (BID) | ORAL | 0 refills | Status: DC

## 2018-02-03 NOTE — Patient Instructions (Addendum)
You do have right heel wound and it does appear that is likely from friction.  No obvious infection presently.  I have you are diabetic and want to watch you closely.  I am referring you back to your endocrinologist and referral made to podiatrist.  Hoping to get you in with podiatrist within a day or 2.  If both of these referrals are delayed and you see any yellow discharge then I want you to go ahead and start a print prescription of doxycycline which I gave you.  The wound culture is pending.  Want you to go ahead and try to by the same brand glucometer which you have presently.  That way strips will work with the machine.  Please get I3J and metabolic panel today.  We will let you know those results.  Sugars definitely need to be controlled with wound present.  Follow-up date to be determined after lab review and after update on referrals.  If by chance  Mackie Pai, PA-C referrals were not made by Thursday or Friday would like to see you before the weekend.

## 2018-02-03 NOTE — Progress Notes (Signed)
Subjective:    Patient ID: Jessica Dennis, female    DOB: January 20, 1933, 85 y.o.   MRN: [id number]  HPI  Pt in for evaluation of rt heal area where blister formed about one week. Then the blister fluid started to leak. Now has 2.5 cm x 2 cm flap of skin present.  Pt last a1c in our computer/epic showed a1c 5.9 9 months. Pt is on novolog 70/30 35 units in am and 25 units at night.  Pt glucometer not working.  No fever, no chills or sweats.   Review of Systems  Constitutional: Negative for chills, fatigue and fever.  Cardiovascular: Negative for palpitations.  Gastrointestinal: Negative for abdominal pain.  Musculoskeletal: Negative for back pain.  Skin: Negative for rash.       Rt heel wound.  Hematological: Negative for adenopathy. Does not bruise/bleed easily.  Psychiatric/Behavioral: Negative for agitation, behavioral problems and decreased concentration.    Past Medical History:  Diagnosis Date  . Anemia   . Angiodysplasia of ascending colon 10/25/2014  . Anxiety   . Arthritis   . Borderline diabetes   . Cancer of the skin, basal cell 09/03/2012  . Cirrhosis ([place])   . Diabetes mellitus without complication ([place])   . Diverticular disease   . 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 GERD (gastroesophageal reflux disease)   . Hypertension   . Iron deficiency anemia due to chronic blood loss 01/19/2015  . Portal hypertensive gastropathy ([place]) 08/02/2016   ? Some GAVE also  . Thyroid disease      Social History   Socioeconomic History  . Marital status: Divorced    Spouse name: Not on file  . Number of children: 2  . Years of education: Not on file  . Highest education level: Not on file  Occupational History  . Occupation: retired  Scientific laboratory technician  . Financial resource strain: Not on file  . Food insecurity:    Worry: Not on file    Inability: Not on file  . Transportation needs:    Medical: Not on file    Non-medical: Not on file  Tobacco Use  .  Smoking status: Never Smoker  . Smokeless tobacco: Never Used  . Tobacco comment: NEVER USED TOBACCO  Substance and Sexual Activity  . Alcohol use: No    Alcohol/week: 2.0 standard drinks    Types: 2 Standard drinks or equivalent per week    Comment: rare occassion  . Drug use: No  . Sexual activity: Never  Lifestyle  . Physical activity:    Days per week: Not on file    Minutes per session: Not on file  . Stress: Not on file  Relationships  . Social connections:    Talks on phone: Not on file    Gets together: Not on file    Attends religious service: Not on file    Active member of club or organization: Not on file    Attends meetings of clubs or organizations: Not on file    Relationship status: Not on file  . Intimate partner violence:    Fear of current or ex partner: Not on file    Emotionally abused: Not on file    Physically abused: Not on file    Forced sexual activity: Not on file  Other Topics Concern  . Not on file  Social History Narrative   The patient is divorced. She is originally from [place]. She has 2 sons. 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   08/10/2014    Past Surgical History:  Procedure Laterality Date  . ABDOMINAL HYSTERECTOMY    . APPENDECTOMY    . COLONOSCOPY    . ESOPHAGOGASTRODUODENOSCOPY    . IR PARACENTESIS  05/25/2016  . LEG SKIN LESION  BIOPSY / EXCISION  12/11/14  . MOHS SURGERY     ankle  . TONSILLECTOMY    . TOTAL HIP ARTHROPLASTY Bilateral 1993, 2006  . UPPER GASTROINTESTINAL ENDOSCOPY      Family History  Problem Relation Age of Onset  . Bladder Cancer Father   . Heart attack Father   . Diabetes Mother   . Colon cancer Neg Hx   . Colon polyps Neg Hx   . Esophageal cancer Neg Hx   . Rectal cancer Neg Hx   . Stomach cancer Neg Hx     No Known Allergies  Current Outpatient Medications on File Prior to Visit  Medication Sig Dispense Refill  . ACCU-CHEK FASTCLIX LANCETS MISC CHECK  BLOOD SUGAR ONCE DAILY. 102 each 6  . Ascorbic Acid (VITAMIN C) 1000 MG tablet Take 1,000 mg by mouth daily.    . Calcium Carbonate-Vitamin D 600-200 MG-UNIT CAPS Take by mouth.    . celecoxib (CELEBREX) 200 MG capsule TAKE 1 CAPSULE (200 MG TOTAL) BY MOUTH 2 (TWO) TIMES DAILY. WITH MEALS    . ferrous sulfate 325 (65 FE) MG tablet Take 325 mg by mouth 2 (two) times daily with a meal.    . Flaxseed, Linseed, (FLAX SEED OIL) 1000 MG CAPS Take by mouth.    . furosemide (LASIX) 20 MG tablet Take 1 tablet (20 mg total) by mouth daily. 30 tablet 1  . glucose blood test strip Accu-Chek [employer]    . Insulin Aspart Prot & Aspart (NOVOLOG MIX 70/30 ) Inject into the skin. 35 units in the mornings and 25 at night    . latanoprost (XALATAN) 0.005 % ophthalmic solution Place 1 drop into both eyes at bedtime.     Marland Kitchen levothyroxine (SYNTHROID, LEVOTHROID) 75 MCG tablet Take 1 tablet (75 mcg total) by mouth daily. Needs ov 90 tablet 0  . LORazepam (ATIVAN) 0.5 MG tablet TAKE ONE TABLET BY MOUTH DAILY AS NEEDED 90 tablet 0  . Melatonin 5 MG CAPS Take 5 mg by mouth.    . Omega-3 Fatty Acids (FISH OIL) 1000 MG CAPS Take by mouth every morning.    . pantoprazole (PROTONIX) 40 MG tablet Take 1 tablet (40 mg total) by mouth daily. Needs ov 90 tablet 0  . Potassium Chloride ER 20 MEQ TBCR TAKE ONE TABLET BY MOUTH DAILY 90 tablet 1  . Prednicarbate 0.1 % CREA Apply topically 2 (two) times daily.     . simvastatin (ZOCOR) 20 MG tablet TAKE ONE TABLET BY MOUTH AT BEDTIME - NEEDS OFFICE VISIT 90 tablet 0  . spironolactone (ALDACTONE) 50 MG tablet TAKE TWO TABLETS BY MOUTH DAILY 180 tablet 1  . tiZANidine (ZANAFLEX) 4 MG tablet 1 po qhs prn back spasms 10 tablet 1  . vitamin E 100 UNIT capsule Take by mouth daily.     No current facility-administered medications on file prior to visit.     BP (!) 128/106   Pulse 74   Temp 97.9 F (36.6 C) (Oral)   Resp 16   Ht 5\' 5"  (1.651 m)   Wt 172 lb 9.6 oz (78.3  kg)   SpO2 100%   BMI 28.72 kg/m  Objective:   Physical Exam  General- No acute distress. Pleasant patient. Neck- Full range of motion, no jvd Lungs- Clear, even and unlabored. Heart- regular rate and rhythm. Neurologic- CNII- XII grossly intact.  Rt heel- 2.5 approximate blister that drained. Now shallow wound. Looks clean. No yellow dc. Surrounding skin has good color good. Normal smell no odor.       Assessment & Plan:  You do have right heel wound and it does appear that is likely from friction.  No obvious infection presently.  I have you are diabetic and want to watch you closely.  I am referring you back to your endocrinologist and referral made to podiatrist.  Hoping to get you in with podiatrist within a day or 2.  If both of these referrals are delayed and you see any yellow discharge then I want you to go ahead and start a print prescription of doxycycline which I gave you.  The wound culture is pending.  Want you to go ahead and try to by the same brand glucometer which you have presently.  That way strips will work with the machine.  Please get Q6S and metabolic panel today.  We will let you know those results.  Sugars definitely need to be controlled with wound present.  Follow-up date to be determined after lab review and after update on referrals.  If by chance referrals were not made by Thursday or Friday would like to see you before the weekend.

## 2018-02-04 ENCOUNTER — Telehealth: Payer: Self-pay | Admitting: Medical

## 2018-02-04 DIAGNOSIS — E10628 Type 1 diabetes mellitus with other skin complications: Secondary | ICD-10-CM

## 2018-02-04 LAB — COMPREHENSIVE METABOLIC PANEL
ALT: 27 U/L (ref 0–35)
AST: 23 U/L (ref 0–37)
Albumin: 3.8 g/dL (ref 3.5–5.2)
Alkaline Phosphatase: 111 U/L (ref 39–117)
BUN: 36 mg/dL — ABNORMAL HIGH (ref 6–23)
CO2: 23 mEq/L (ref 19–32)
Calcium: 9.6 mg/dL (ref 8.4–10.5)
Chloride: 108 mEq/L (ref 96–112)
Creatinine, Ser: 1.19 mg/dL (ref 0.40–1.20)
GFR: 46.03 mL/min — ABNORMAL LOW (ref >60.00)
Glucose, Bld: 125 mg/dL — ABNORMAL HIGH (ref 70–99)
Potassium: 5.1 mEq/L (ref 3.5–5.1)
Sodium: 139 mEq/L (ref 135–145)
Total Bilirubin: 1.2 mg/dL (ref 0.2–1.2)
Total Protein: 6 g/dL (ref 6.0–8.3)

## 2018-02-04 NOTE — Telephone Encounter (Signed)
Gwenn would you get patient in with podiatrist this week? Can you give me update by Thursday morning.

## 2018-02-05 ENCOUNTER — Other Ambulatory Visit (INDEPENDENT_AMBULATORY_CARE_PROVIDER_SITE_OTHER): Payer: Medicare HMO

## 2018-02-05 DIAGNOSIS — K7031 Alcoholic cirrhosis of liver with ascites: Secondary | ICD-10-CM

## 2018-02-05 DIAGNOSIS — R69 Illness, unspecified: Secondary | ICD-10-CM | POA: Diagnosis not present

## 2018-02-05 LAB — COMPREHENSIVE METABOLIC PANEL
ALT: 27 U/L (ref 0–35)
AST: 22 U/L (ref 0–37)
Albumin: 3.7 g/dL (ref 3.5–5.2)
Alkaline Phosphatase: 102 U/L (ref 39–117)
BUN: 26 mg/dL — ABNORMAL HIGH (ref 6–23)
CO2: 25 mEq/L (ref 19–32)
Calcium: 10.1 mg/dL (ref 8.4–10.5)
Chloride: 103 mEq/L (ref 96–112)
Creatinine, Ser: 1.18 mg/dL (ref 0.40–1.20)
GFR: 46.48 mL/min — ABNORMAL LOW (ref >60.00)
Glucose, Bld: 277 mg/dL — ABNORMAL HIGH (ref 70–99)
Potassium: 5.2 mEq/L — ABNORMAL HIGH (ref 3.5–5.1)
Sodium: 133 mEq/L — ABNORMAL LOW (ref 135–145)
Total Bilirubin: 1.5 mg/dL — ABNORMAL HIGH (ref 0.2–1.2)
Total Protein: 6.2 g/dL (ref 6.0–8.3)

## 2018-02-05 LAB — CBC WITH DIFFERENTIAL/PLATELET
Basophils Absolute: 0 10*3/uL (ref 0.0–0.1)
Basophils Relative: 0.4 % (ref 0.0–3.0)
Eosinophils Absolute: 0.2 10*3/uL (ref 0.0–0.7)
Eosinophils Relative: 2.1 % (ref 0.0–5.0)
HCT: 35.4 % — ABNORMAL LOW (ref 36.0–46.0)
Hemoglobin: 12.1 g/dL (ref 12.0–15.0)
Lymphocytes Relative: 9.8 % — ABNORMAL LOW (ref 12.0–46.0)
Lymphs Abs: 0.7 10*3/uL (ref 0.7–4.0)
MCHC: 34.3 g/dL (ref 30.0–36.0)
MCV: 99.5 fl (ref 78.0–100.0)
Monocytes Absolute: 0.4 10*3/uL (ref 0.1–1.0)
Monocytes Relative: 5 % (ref 3.0–12.0)
Neutro Abs: 6.2 10*3/uL (ref 1.4–7.7)
Neutrophils Relative %: 82.7 % — ABNORMAL HIGH (ref 43.0–77.0)
Platelets: 72 10*3/uL — ABNORMAL LOW (ref 150.0–400.0)
RBC: 3.56 Mil/uL — ABNORMAL LOW (ref 3.87–5.11)
RDW: 21.4 % — ABNORMAL HIGH (ref 11.5–15.5)
WBC: 7.5 10*3/uL (ref 4.0–10.5)

## 2018-02-05 LAB — PROTIME-INR
INR: 1.2 ratio — ABNORMAL HIGH (ref 0.8–1.0)
Prothrombin Time: 13.6 s — ABNORMAL HIGH (ref 9.6–13.1)

## 2018-02-05 LAB — FERRITIN: Ferritin: 58.1 ng/mL (ref 10.0–291.0)

## 2018-02-06 DIAGNOSIS — L89893 Pressure ulcer of other site, stage 3: Secondary | ICD-10-CM | POA: Diagnosis not present

## 2018-02-06 DIAGNOSIS — E11621 Type 2 diabetes mellitus with foot ulcer: Secondary | ICD-10-CM | POA: Diagnosis not present

## 2018-02-06 LAB — WOUND CULTURE
GRAM STAIN:: NONE SEEN
MICRO NUMBER:: 46389
SPECIMEN QUALITY:: ADEQUATE

## 2018-02-07 ENCOUNTER — Ambulatory Visit: Payer: Medicare HMO | Admitting: Internal Medicine

## 2018-02-07 ENCOUNTER — Encounter: Payer: Self-pay | Admitting: Internal Medicine

## 2018-02-07 VITALS — BP 120/70 | HR 68 | Ht 65.0 in | Wt 171.0 lb

## 2018-02-07 DIAGNOSIS — K7031 Alcoholic cirrhosis of liver with ascites: Secondary | ICD-10-CM

## 2018-02-07 DIAGNOSIS — E875 Hyperkalemia: Secondary | ICD-10-CM

## 2018-02-07 DIAGNOSIS — I10 Essential (primary) hypertension: Secondary | ICD-10-CM | POA: Diagnosis not present

## 2018-02-07 DIAGNOSIS — E039 Hypothyroidism, unspecified: Secondary | ICD-10-CM | POA: Diagnosis not present

## 2018-02-07 DIAGNOSIS — E1165 Type 2 diabetes mellitus with hyperglycemia: Secondary | ICD-10-CM | POA: Diagnosis not present

## 2018-02-07 DIAGNOSIS — R69 Illness, unspecified: Secondary | ICD-10-CM | POA: Diagnosis not present

## 2018-02-07 DIAGNOSIS — E7800 Pure hypercholesterolemia, unspecified: Secondary | ICD-10-CM | POA: Diagnosis not present

## 2018-02-07 NOTE — Progress Notes (Signed)
Jessica Dennis 85 y.o. [birth year] [id number]  Assessment & Plan:   Encounter Diagnoses  Name Primary?  . Alcoholic cirrhosis of liver with ascites ([place])   . Hyperkalemia Yes    Alcoholic cirrhosis of liver with ascites ([place]) Doing well RTC 6 mos   I have discontinued her potassium supplement I think the spironolactone is helping her maintain her potassium overall and I suspect she will be okay on this.  I have asked her to follow-up with Dr. Cheri Rous later on this year it looks like a physical/Medicare wellness checkup is appropriate.  I appreciate the opportunity to care for this patient. CC: Ann Held, DO    Subjective:   Chief Complaint: Follow-up of cirrhosis  HPI Jessica Dennis is here for follow-up of her alcoholic cirrhosis.  Recent laboratory testing which is in the EMR shows that things are stable with respect to her labs though her potassium is slightly elevated at 5.2 and she is on a potassium supplement in addition to her furosemide and spironolactone.  She has no specific complaints today.  She knows that she should be abstinent from alcohol though will have 1 glass of wine up to 4 times a month she says.  She is aware of the potential complications of that.  Ultrasound imaging in November 2019 showed stable cirrhosis and no lesions. No Known Allergies Current Meds  Medication Sig  . ACCU-CHEK FASTCLIX LANCETS MISC CHECK BLOOD SUGAR ONCE DAILY.  Marland Kitchen Ascorbic Acid (VITAMIN C) 1000 MG tablet Take 1,000 mg by mouth daily.  . Calcium Carbonate-Vitamin D 600-200 MG-UNIT CAPS Take by mouth.  . doxycycline (VIBRA-TABS) 100 MG tablet Take 1 tablet (100 mg total) by mouth 2 (two) times daily. Can give caps or generic  . ferrous sulfate 325 (65 FE) MG tablet Take 325 mg by mouth 2 (two) times daily with a meal.  . Flaxseed, Linseed, (FLAX SEED OIL) 1000 MG CAPS Take by mouth.  . furosemide (LASIX) 20 MG tablet Take 1 tablet (20 mg total) by mouth daily.  Marland Kitchen glucose blood  test strip Accu-Chek [employer]  . Insulin Aspart Prot & Aspart (NOVOLOG MIX 70/30 [place]) Inject into the skin. 35 units in the mornings and 25 at night  . latanoprost (XALATAN) 0.005 % ophthalmic solution Place 1 drop into both eyes at bedtime.   Marland Kitchen levothyroxine (SYNTHROID, LEVOTHROID) 75 MCG tablet Take 1 tablet (75 mcg total) by mouth daily. Needs ov  . LORazepam (ATIVAN) 0.5 MG tablet TAKE ONE TABLET BY MOUTH DAILY AS NEEDED  . Melatonin 5 MG CAPS Take 5 mg by mouth.  . Omega-3 Fatty Acids (FISH OIL) 1000 MG CAPS Take by mouth every morning.  . pantoprazole (PROTONIX) 40 MG tablet Take 1 tablet (40 mg total) by mouth daily. Needs ov  . Potassium Chloride ER 20 MEQ TBCR TAKE ONE TABLET BY MOUTH DAILY  . Prednicarbate 0.1 % CREA Apply topically 2 (two) times daily.   . simvastatin (ZOCOR) 20 MG tablet TAKE ONE TABLET BY MOUTH AT BEDTIME - NEEDS OFFICE VISIT  . spironolactone (ALDACTONE) 50 MG tablet TAKE TWO TABLETS BY MOUTH DAILY  . tiZANidine (ZANAFLEX) 4 MG tablet TAKE ONE TABLET BY MOUTH EVERY NIGHT AT BEDTIME  . vitamin E 100 UNIT capsule Take by mouth daily.   Past Medical History:  Diagnosis Date  . Anemia   . Angiodysplasia of ascending colon 10/25/2014  . Anxiety   . Arthritis   . Borderline diabetes   . Cancer  of the skin, basal cell 09/03/2012  . Cirrhosis ([place])   . Diabetes mellitus without complication ([place])   . Diverticular disease   . 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 GERD (gastroesophageal reflux disease)   . Hypertension   . Iron deficiency anemia due to chronic blood loss 01/19/2015  . Portal hypertensive gastropathy ([place]) 08/02/2016   ? Some GAVE also  . Thyroid disease    Past Surgical History:  Procedure Laterality Date  . ABDOMINAL HYSTERECTOMY    . APPENDECTOMY    . COLONOSCOPY    . ESOPHAGOGASTRODUODENOSCOPY    . IR PARACENTESIS  05/25/2016  . LEG SKIN LESION  BIOPSY / EXCISION  12/11/14  . MOHS SURGERY     ankle  .  TONSILLECTOMY    . TOTAL HIP ARTHROPLASTY Bilateral 1993, 2006  . UPPER GASTROINTESTINAL ENDOSCOPY     Social History   Social History Narrative   The patient is divorced. She is originally from [place]. She has 2 sons. 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   08/10/2014   family history includes Bladder Cancer in her father; Diabetes in her mother; Heart attack in her father.   Review of Systems As above  Objective:   Physical Exam BP 120/70   Pulse 68   Ht 5\' 5"  (1.651 m)   Wt 171 lb (77.6 kg)   BMI 28.46 kg/m  Elderly white woman no acute distress Eyes are anicteric The lungs are clear The heart sounds are normal The abdomen is soft slightly obese nondistended no organomegaly mass or obvious ascites She is awake alert and oriented x3

## 2018-02-07 NOTE — Patient Instructions (Signed)
   Please stop taking the K-Dur (potassium)  Please make an appointment to see Dr. Carollee Herter.  I will see you in about 6 months -   I appreciate the opportunity to care for you. Gatha Mayer, MD, [facility]

## 2018-02-07 NOTE — Assessment & Plan Note (Signed)
Doing well RTC 6 mos 

## 2018-02-11 DIAGNOSIS — M545 Low back pain: Secondary | ICD-10-CM | POA: Diagnosis not present

## 2018-02-14 ENCOUNTER — Ambulatory Visit (INDEPENDENT_AMBULATORY_CARE_PROVIDER_SITE_OTHER): Payer: Medicare HMO | Admitting: Family Medicine

## 2018-02-14 ENCOUNTER — Encounter: Payer: Self-pay | Admitting: Family Medicine

## 2018-02-14 VITALS — BP 126/60 | HR 86 | Temp 98.5°F | Resp 16 | Ht 65.0 in | Wt 169.8 lb

## 2018-02-14 DIAGNOSIS — E039 Hypothyroidism, unspecified: Secondary | ICD-10-CM | POA: Diagnosis not present

## 2018-02-14 DIAGNOSIS — S91301D Unspecified open wound, right foot, subsequent encounter: Secondary | ICD-10-CM

## 2018-02-14 DIAGNOSIS — IMO0002 Reserved for concepts with insufficient information to code with codable children: Secondary | ICD-10-CM

## 2018-02-14 DIAGNOSIS — Z862 Personal history of diseases of the blood and blood-forming organs and certain disorders involving the immune mechanism: Secondary | ICD-10-CM | POA: Diagnosis not present

## 2018-02-14 DIAGNOSIS — E1151 Type 2 diabetes mellitus with diabetic peripheral angiopathy without gangrene: Secondary | ICD-10-CM

## 2018-02-14 DIAGNOSIS — E1165 Type 2 diabetes mellitus with hyperglycemia: Secondary | ICD-10-CM

## 2018-02-14 DIAGNOSIS — I10 Essential (primary) hypertension: Secondary | ICD-10-CM | POA: Diagnosis not present

## 2018-02-14 DIAGNOSIS — E785 Hyperlipidemia, unspecified: Secondary | ICD-10-CM | POA: Diagnosis not present

## 2018-02-14 DIAGNOSIS — D500 Iron deficiency anemia secondary to blood loss (chronic): Secondary | ICD-10-CM

## 2018-02-14 MED ORDER — CEPHALEXIN 500 MG PO CAPS: 500.0000 mg | ORAL_CAPSULE | Freq: Two times a day (BID) | ORAL | 0 refills | Status: DC

## 2018-02-14 NOTE — Progress Notes (Signed)
Patient ID: Jessica Dennis, female    DOB: [birth year]  Age: 85 y.o. MRN: [id number]    Subjective:  Subjective  HPI Jessica Dennis presents for sore on r heel that does not want to heal.  It started out like a blister and then became red and swollen--- she was seen here earlier this month and sent to podiatry.  The podiatrist but her on augmenting and silvadene cream--- she feels it is not helping.   She is also overdue for labs  No other complaints.    Review of Systems  Constitutional: Negative for appetite change, diaphoresis, fatigue and unexpected weight change.  Eyes: Negative for pain, redness and visual disturbance.  Respiratory: Negative for cough, chest tightness, shortness of breath and wheezing.   Cardiovascular: Positive for leg swelling. Negative for chest pain and palpitations.  Endocrine: Negative for cold intolerance, heat intolerance, polydipsia, polyphagia and polyuria.  Genitourinary: Negative for difficulty urinating, dysuria and frequency.  Skin: Positive for wound.  Neurological: Negative for dizziness, light-headedness, numbness and headaches.    History Past Medical History:  Diagnosis Date  . Anemia   . Angiodysplasia of ascending colon 10/25/2014  . Anxiety   . Arthritis   . Borderline diabetes   . Cancer of the skin, basal cell 09/03/2012  . Cirrhosis ([place])   . Diabetes mellitus without complication ([place])   . Diverticular disease   . 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 GERD (gastroesophageal reflux disease)   . Hypertension   . Iron deficiency anemia due to chronic blood loss 01/19/2015  . Portal hypertensive gastropathy ([place]) 08/02/2016   ? Some GAVE also  . Thyroid disease     She has a past surgical history that includes Abdominal hysterectomy; Total hip arthroplasty (Bilateral, 1993, 2006); Appendectomy; Tonsillectomy; Colonoscopy; Leg skin lesion  biopsy / excision (12/11/14); Mohs surgery; Esophagogastroduodenoscopy; IR  Paracentesis (05/25/2016); and Upper gastrointestinal endoscopy.   Her family history includes Bladder Cancer in her father; Diabetes in her mother; Heart attack in her father.She reports that she has never smoked. She has never used smokeless tobacco. She reports that she does not drink alcohol or use drugs.  Current Outpatient Medications on File Prior to Visit  Medication Sig Dispense Refill  . ACCU-CHEK FASTCLIX LANCETS MISC CHECK BLOOD SUGAR ONCE DAILY. 102 each 6  . Ascorbic Acid (VITAMIN C) 1000 MG tablet Take 1,000 mg by mouth daily.    . Calcium Carbonate-Vitamin D 600-200 MG-UNIT CAPS Take by mouth.    . doxycycline (VIBRA-TABS) 100 MG tablet Take 1 tablet (100 mg total) by mouth 2 (two) times daily. Can give caps or generic 14 tablet 0  . ferrous sulfate 325 (65 FE) MG tablet Take 325 mg by mouth 2 (two) times daily with a meal.    . Flaxseed, Linseed, (FLAX SEED OIL) 1000 MG CAPS Take by mouth.    . furosemide (LASIX) 20 MG tablet Take 1 tablet (20 mg total) by mouth daily. 30 tablet 1  . glucose blood test strip Accu-Chek [employer]    . Insulin Aspart Prot & Aspart (NOVOLOG MIX 70/30 [place]) Inject into the skin. 35 units in the mornings and 25 at night    . latanoprost (XALATAN) 0.005 % ophthalmic solution Place 1 drop into both eyes at bedtime.     Marland Kitchen levothyroxine (SYNTHROID, LEVOTHROID) 75 MCG tablet Take 1 tablet (75 mcg total) by mouth daily. Needs ov 90 tablet 0  . LORazepam (ATIVAN) 0.5 MG tablet TAKE ONE  TABLET BY MOUTH DAILY AS NEEDED 90 tablet 0  . Melatonin 5 MG CAPS Take 5 mg by mouth.    . Omega-3 Fatty Acids (FISH OIL) 1000 MG CAPS Take by mouth every morning.    . pantoprazole (PROTONIX) 40 MG tablet Take 1 tablet (40 mg total) by mouth daily. Needs ov 90 tablet 0  . Prednicarbate 0.1 % CREA Apply topically 2 (two) times daily.     . simvastatin (ZOCOR) 20 MG tablet TAKE ONE TABLET BY MOUTH AT BEDTIME - NEEDS OFFICE VISIT 90 tablet 0  . spironolactone  (ALDACTONE) 50 MG tablet TAKE TWO TABLETS BY MOUTH DAILY 180 tablet 1  . SSD 1 % cream     . tiZANidine (ZANAFLEX) 4 MG tablet TAKE ONE TABLET BY MOUTH EVERY NIGHT AT BEDTIME 30 tablet 5  . vitamin E 100 UNIT capsule Take by mouth daily.     No current facility-administered medications on file prior to visit.      Objective:  Objective  Physical Exam Vitals signs and nursing note reviewed.  Skin:    Findings: Erythema and lesion present.         BP 126/60 (BP Location: Left Arm, Cuff Size: Normal)   Pulse 86   Temp 98.5 F (36.9 C) (Oral)   Resp 16   Ht 5\' 5"  (1.651 m)   Wt 169 lb 12.8 oz (77 kg)   SpO2 98%   BMI 28.26 kg/m  Wt Readings from Last 3 Encounters:  02/14/18 169 lb 12.8 oz (77 kg)  02/07/18 171 lb (77.6 kg)  02/03/18 172 lb 9.6 oz (78.3 kg)     Lab Results  Component Value Date   WBC 7.5 02/05/2018   HGB 12.1 02/05/2018   HCT 35.4 (L) 02/05/2018   PLT 72.0 (L) 02/05/2018   GLUCOSE 277 (H) 02/05/2018   CHOL 112 04/26/2017   TRIG 95.0 04/26/2017   HDL 56.00 04/26/2017   LDLCALC 37 04/26/2017   ALT 27 02/05/2018   AST 22 02/05/2018   NA 133 (L) 02/05/2018   K 5.2 (H) 02/05/2018   CL 103 02/05/2018   CREATININE 1.18 02/05/2018   BUN 26 (H) 02/05/2018   CO2 25 02/05/2018   TSH 2.25 04/26/2017   INR 1.2 (H) 02/05/2018   HGBA1C 5.9 04/26/2017   MICROALBUR <0.7 10/26/2016    US Abdomen Limited Ruq  Result Date: 11/28/2017 CLINICAL DATA:  Alcoholic liver cirrhosis with ascites. EXAM: ULTRASOUND ABDOMEN LIMITED RIGHT UPPER QUADRANT COMPARISON:  02/25/2017. FINDINGS: Gallbladder: No gallstones or wall thickening visualized. No sonographic Murphy sign noted by sonographer. Common bile duct: Diameter: 1.1 mm Liver: The liver remains somewhat small with irregular contours. Again demonstrated is a recanalized umbilical vein. No liver masses are seen. No ascites visualized. Portal vein is patent on color Doppler imaging with normal direction of blood flow  towards the liver. IMPRESSION: 1. No liver mass seen. 2. Stable changes of cirrhosis of the liver with a recanalized umbilical vein compatible with portal venous hypertension. Electronically Signed   By: Claudie Revering M.D.   On: 11/28/2017 09:20     Assessment & Plan:  Plan  I am having Jessica Izquierdo "Trudi" start on cephALEXin. I am also having her maintain her Prednicarbate, latanoprost, Fish Oil, vitamin C, Calcium Carbonate-Vitamin D, Melatonin, Flax Seed Oil, vitamin E, glucose blood, Insulin Aspart Prot & Aspart (NOVOLOG MIX 70/30 [place]), ACCU-CHEK FASTCLIX LANCETS, furosemide, ferrous sulfate, levothyroxine, pantoprazole, LORazepam, simvastatin, spironolactone, doxycycline, tiZANidine, and SSD.  Meds  ordered this encounter  Medications  . cephALEXin (KEFLEX) 500 MG capsule    Sig: Take 1 capsule (500 mg total) by mouth 2 (two) times daily.    Dispense:  20 capsule    Refill:  0    Problem List Items Addressed This Visit      Unprioritized   Adult hypothyroidism    Check labs today      Anemia, iron deficiency    Check cbcd today      DM (diabetes mellitus) type II uncontrolled, periph vascular disorder ([place])    Per endo      Relevant Orders   Lipid panel   Comprehensive metabolic panel   Essential hypertension, benign    Well controlled, no changes to meds. Encouraged heart healthy diet such as the DASH diet and exercise as tolerated.       History of anemia   Relevant Orders   CBC with Differential/Platelet   Hyperlipidemia LDL goal <100    Tolerating statin, encouraged heart healthy diet, avoid trans fats, minimize simple carbs and saturated fats. Increase exercise as tolerated      Hypothyroidism   Relevant Orders   TSH    Other Visit Diagnoses    Non-healing open wound of right heel, subsequent encounter    -  Primary   Relevant Medications   cephALEXin (KEFLEX) 500 MG capsule   Other Relevant Orders   Ambulatory referral to Wound Clinic   Hyperlipidemia,  unspecified hyperlipidemia type       Relevant Orders   Lipid panel   Comprehensive metabolic panel   Essential hypertension       Relevant Orders   Lipid panel   Comprehensive metabolic panel   TSH      Follow-up: Return in about 6 months (around 08/15/2018) for hypertension, hyperlipidemia.  Ann Held, DO

## 2018-02-14 NOTE — Assessment & Plan Note (Signed)
Check labs today.

## 2018-02-14 NOTE — Assessment & Plan Note (Signed)
Check cbcd today

## 2018-02-14 NOTE — Assessment & Plan Note (Signed)
Tolerating statin, encouraged heart healthy diet, avoid trans fats, minimize simple carbs and saturated fats. Increase exercise as tolerated 

## 2018-02-14 NOTE — Assessment & Plan Note (Signed)
Per endo °

## 2018-02-14 NOTE — Assessment & Plan Note (Signed)
Well controlled, no changes to meds. Encouraged heart healthy diet such as the DASH diet and exercise as tolerated.  °

## 2018-02-14 NOTE — Patient Instructions (Signed)
Wound Care, Adult  Taking care of your wound properly can help to prevent pain, infection, and scarring. It can also help your wound to heal more quickly.  How to care for your wound  Wound care          Follow instructions from your health care provider about how to take care of your wound. Make sure you:  ? Wash your hands with soap and water before you change the bandage (dressing). If soap and water are not available, use hand sanitizer.  ? Change your dressing as told by your health care provider.  ? Leave stitches (sutures), skin glue, or adhesive strips in place. These skin closures may need to stay in place for 2 weeks or longer. If adhesive strip edges start to loosen and curl up, you may trim the loose edges. Do not remove adhesive strips completely unless your health care provider tells you to do that.   Check your wound area every day for signs of infection. Check for:  ? Redness, swelling, or pain.  ? Fluid or blood.  ? Warmth.  ? Pus or a bad smell.   Ask your health care provider if you should clean the wound with mild soap and water. Doing this may include:  ? Using a clean towel to pat the wound dry after cleaning it. Do not rub or scrub the wound.  ? Applying a cream or ointment. Do this only as told by your health care provider.  ? Covering the incision with a clean dressing.   Ask your health care provider when you can leave the wound uncovered.   Keep the dressing dry until your health care provider says it can be removed. Do not take baths, swim, use a hot tub, or do anything that would put the wound underwater until your health care provider approves. Ask your health care provider if you can take showers. You may only be allowed to take sponge baths.  Medicines     If you were prescribed an antibiotic medicine, cream, or ointment, take or use the antibiotic as told by your health care provider. Do not stop taking or using the antibiotic even if your condition improves.   Take  over-the-counter and prescription medicines only as told by your health care provider. If you were prescribed pain medicine, take it 30 or more minutes before you do any wound care or as told by your health care provider.  General instructions   Return to your normal activities as told by your health care provider. Ask your health care provider what activities are safe.   Do not scratch or pick at the wound.   Do not use any products that contain nicotine or tobacco, such as cigarettes and e-cigarettes. These may delay wound healing. If you need help quitting, ask your health care provider.   Keep all follow-up visits as told by your health care provider. This is important.   Eat a diet that includes protein, vitamin A, vitamin C, and other nutrient-rich foods to help the wound heal.  ? Foods rich in protein include meat, dairy, beans, nuts, and other sources.  ? Foods rich in vitamin A include carrots and dark green, leafy vegetables.  ? Foods rich in vitamin C include citrus, tomatoes, and other fruits and vegetables.  ? Nutrient-rich foods have protein, carbohydrates, fat, vitamins, or minerals. Eat a variety of healthy foods including vegetables, fruits, and whole grains.  Contact a health care provider if:     You received a tetanus shot and you have swelling, severe pain, redness, or bleeding at the injection site.   Your pain is not controlled with medicine.   You have redness, swelling, or pain around the wound.   You have fluid or blood coming from the wound.   Your wound feels warm to the touch.   You have pus or a bad smell coming from the wound.   You have a fever or chills.   You are nauseous or you vomit.   You are dizzy.  Get help right away if:   You have a red streak going away from your wound.   The edges of the wound open up and separate.   Your wound is bleeding, and the bleeding does not stop with gentle pressure.   You have a rash.   You faint.   You have trouble  breathing.  Summary   Always wash your hands with soap and water before changing your bandage (dressing).   To help with healing, eat foods that are rich in protein, vitamin A, vitamin C, and other nutrients.   Check your wound every day for signs of infection. Contact your health care provider if you suspect that your wound is infected.  This information is not intended to replace advice given to you by your health care provider. Make sure you discuss any questions you have with your health care provider.  Document Released: 10/18/2007 Document Revised: 02/19/2017 Document Reviewed: 07/26/2015  Elsevier Interactive Patient Education  2019 [employer]

## 2018-02-15 LAB — COMPREHENSIVE METABOLIC PANEL
AG Ratio: 1.7 (calc) (ref 1.0–2.5)
ALT: 25 U/L (ref 6–29)
AST: 28 U/L (ref 10–35)
Albumin: 3.7 g/dL (ref 3.6–5.1)
Alkaline phosphatase (APISO): 110 U/L (ref 33–130)
BUN/Creatinine Ratio: 20 (calc) (ref 6–22)
BUN: 21 mg/dL (ref 7–25)
CO2: 23 mmol/L (ref 20–32)
Calcium: 9.3 mg/dL (ref 8.6–10.4)
Chloride: 104 mmol/L (ref 98–110)
Creat: 1.06 mg/dL — ABNORMAL HIGH (ref 0.60–0.88)
Globulin: 2.2 g/dL (calc) (ref 1.9–3.7)
Glucose, Bld: 208 mg/dL — ABNORMAL HIGH (ref 65–99)
Potassium: 4.1 mmol/L (ref 3.5–5.3)
Sodium: 136 mmol/L (ref 135–146)
Total Bilirubin: 1.9 mg/dL — ABNORMAL HIGH (ref 0.2–1.2)
Total Protein: 5.9 g/dL — ABNORMAL LOW (ref 6.1–8.1)

## 2018-02-15 LAB — CBC WITH DIFFERENTIAL/PLATELET
Absolute Monocytes: 504 cells/uL (ref 200–950)
Basophils Absolute: 22 cells/uL (ref 0–200)
Basophils Relative: 0.3 %
Eosinophils Absolute: 94 cells/uL (ref 15–500)
Eosinophils Relative: 1.3 %
HCT: 32 % — ABNORMAL LOW (ref 35.0–45.0)
Hemoglobin: 11 g/dL — ABNORMAL LOW (ref 11.7–15.5)
Lymphs Abs: 590 cells/uL — ABNORMAL LOW (ref 850–3900)
MCH: 33.8 pg — ABNORMAL HIGH (ref 27.0–33.0)
MCHC: 34.4 g/dL (ref 32.0–36.0)
MCV: 98.5 fL (ref 80.0–100.0)
MPV: 12.4 fL (ref 7.5–12.5)
Monocytes Relative: 7 %
Neutro Abs: 5990 cells/uL (ref 1500–7800)
Neutrophils Relative %: 83.2 %
Platelets: 80 10*3/uL — ABNORMAL LOW (ref 140–400)
RBC: 3.25 10*6/uL — ABNORMAL LOW (ref 3.80–5.10)
RDW: 15 % (ref 11.0–15.0)
Total Lymphocyte: 8.2 %
WBC: 7.2 10*3/uL (ref 3.8–10.8)

## 2018-02-15 LAB — LIPID PANEL
Cholesterol: 132 mg/dL (ref <200)
HDL: 50 mg/dL — ABNORMAL LOW (ref >50)
LDL Cholesterol (Calc): 57 mg/dL (calc)
Non-HDL Cholesterol (Calc): 82 mg/dL (calc) (ref <130)
Total CHOL/HDL Ratio: 2.6 (calc) (ref <5.0)
Triglycerides: 169 mg/dL — ABNORMAL HIGH (ref <150)

## 2018-02-15 LAB — TSH: TSH: 1.71 mIU/L (ref 0.40–4.50)

## 2018-02-20 DIAGNOSIS — M4187 Other forms of scoliosis, lumbosacral region: Secondary | ICD-10-CM | POA: Diagnosis not present

## 2018-02-20 DIAGNOSIS — M545 Low back pain: Secondary | ICD-10-CM | POA: Diagnosis not present

## 2018-02-20 DIAGNOSIS — M5126 Other intervertebral disc displacement, lumbar region: Secondary | ICD-10-CM | POA: Diagnosis not present

## 2018-02-20 DIAGNOSIS — M4310 Spondylolisthesis, site unspecified: Secondary | ICD-10-CM | POA: Diagnosis not present

## 2018-02-20 DIAGNOSIS — D638 Anemia in other chronic diseases classified elsewhere: Secondary | ICD-10-CM | POA: Diagnosis not present

## 2018-02-20 DIAGNOSIS — M25552 Pain in left hip: Secondary | ICD-10-CM | POA: Diagnosis not present

## 2018-02-20 DIAGNOSIS — Z96642 Presence of left artificial hip joint: Secondary | ICD-10-CM | POA: Diagnosis not present

## 2018-02-20 DIAGNOSIS — M48061 Spinal stenosis, lumbar region without neurogenic claudication: Secondary | ICD-10-CM | POA: Diagnosis not present

## 2018-02-24 ENCOUNTER — Encounter (HOSPITAL_BASED_OUTPATIENT_CLINIC_OR_DEPARTMENT_OTHER): Payer: Medicare HMO | Attending: Internal Medicine

## 2018-02-24 DIAGNOSIS — W2203XA Walked into furniture, initial encounter: Secondary | ICD-10-CM | POA: Insufficient documentation

## 2018-02-24 DIAGNOSIS — E1151 Type 2 diabetes mellitus with diabetic peripheral angiopathy without gangrene: Secondary | ICD-10-CM | POA: Insufficient documentation

## 2018-02-24 DIAGNOSIS — K7030 Alcoholic cirrhosis of liver without ascites: Secondary | ICD-10-CM | POA: Insufficient documentation

## 2018-02-24 DIAGNOSIS — R69 Illness, unspecified: Secondary | ICD-10-CM | POA: Diagnosis not present

## 2018-02-24 DIAGNOSIS — S8011XA Contusion of right lower leg, initial encounter: Secondary | ICD-10-CM | POA: Insufficient documentation

## 2018-02-24 DIAGNOSIS — E11621 Type 2 diabetes mellitus with foot ulcer: Secondary | ICD-10-CM | POA: Diagnosis not present

## 2018-02-24 DIAGNOSIS — L97411 Non-pressure chronic ulcer of right heel and midfoot limited to breakdown of skin: Secondary | ICD-10-CM | POA: Insufficient documentation

## 2018-02-24 DIAGNOSIS — Z96642 Presence of left artificial hip joint: Secondary | ICD-10-CM | POA: Diagnosis not present

## 2018-02-24 DIAGNOSIS — L97412 Non-pressure chronic ulcer of right heel and midfoot with fat layer exposed: Secondary | ICD-10-CM | POA: Diagnosis not present

## 2018-02-25 ENCOUNTER — Telehealth: Payer: Self-pay | Admitting: Hematology & Oncology

## 2018-02-25 ENCOUNTER — Other Ambulatory Visit: Payer: Self-pay | Admitting: *Deleted

## 2018-02-25 DIAGNOSIS — E1151 Type 2 diabetes mellitus with diabetic peripheral angiopathy without gangrene: Secondary | ICD-10-CM

## 2018-02-25 DIAGNOSIS — IMO0002 Reserved for concepts with insufficient information to code with codable children: Secondary | ICD-10-CM

## 2018-02-25 DIAGNOSIS — E1165 Type 2 diabetes mellitus with hyperglycemia: Secondary | ICD-10-CM

## 2018-02-25 NOTE — Telephone Encounter (Signed)
lmom to inform patient of appt 2/25 at 1030 am

## 2018-03-03 ENCOUNTER — Other Ambulatory Visit: Payer: Self-pay | Admitting: Family Medicine

## 2018-03-03 DIAGNOSIS — L97411 Non-pressure chronic ulcer of right heel and midfoot limited to breakdown of skin: Secondary | ICD-10-CM | POA: Diagnosis not present

## 2018-03-03 DIAGNOSIS — S8011XA Contusion of right lower leg, initial encounter: Secondary | ICD-10-CM | POA: Diagnosis not present

## 2018-03-03 DIAGNOSIS — L97412 Non-pressure chronic ulcer of right heel and midfoot with fat layer exposed: Secondary | ICD-10-CM | POA: Diagnosis not present

## 2018-03-03 DIAGNOSIS — S81801A Unspecified open wound, right lower leg, initial encounter: Secondary | ICD-10-CM | POA: Diagnosis not present

## 2018-03-03 DIAGNOSIS — E1151 Type 2 diabetes mellitus with diabetic peripheral angiopathy without gangrene: Secondary | ICD-10-CM | POA: Diagnosis not present

## 2018-03-03 DIAGNOSIS — W2203XA Walked into furniture, initial encounter: Secondary | ICD-10-CM | POA: Diagnosis not present

## 2018-03-03 DIAGNOSIS — I10 Essential (primary) hypertension: Secondary | ICD-10-CM

## 2018-03-03 DIAGNOSIS — R69 Illness, unspecified: Secondary | ICD-10-CM | POA: Diagnosis not present

## 2018-03-03 DIAGNOSIS — E11621 Type 2 diabetes mellitus with foot ulcer: Secondary | ICD-10-CM | POA: Diagnosis not present

## 2018-03-03 DIAGNOSIS — Z96642 Presence of left artificial hip joint: Secondary | ICD-10-CM | POA: Diagnosis not present

## 2018-03-13 DIAGNOSIS — L97411 Non-pressure chronic ulcer of right heel and midfoot limited to breakdown of skin: Secondary | ICD-10-CM | POA: Diagnosis not present

## 2018-03-13 DIAGNOSIS — W2203XA Walked into furniture, initial encounter: Secondary | ICD-10-CM | POA: Diagnosis not present

## 2018-03-13 DIAGNOSIS — S91301A Unspecified open wound, right foot, initial encounter: Secondary | ICD-10-CM | POA: Diagnosis not present

## 2018-03-13 DIAGNOSIS — E1151 Type 2 diabetes mellitus with diabetic peripheral angiopathy without gangrene: Secondary | ICD-10-CM | POA: Diagnosis not present

## 2018-03-13 DIAGNOSIS — R69 Illness, unspecified: Secondary | ICD-10-CM | POA: Diagnosis not present

## 2018-03-13 DIAGNOSIS — S8011XA Contusion of right lower leg, initial encounter: Secondary | ICD-10-CM | POA: Diagnosis not present

## 2018-03-13 DIAGNOSIS — S81801A Unspecified open wound, right lower leg, initial encounter: Secondary | ICD-10-CM | POA: Diagnosis not present

## 2018-03-13 DIAGNOSIS — E11621 Type 2 diabetes mellitus with foot ulcer: Secondary | ICD-10-CM | POA: Diagnosis not present

## 2018-03-13 DIAGNOSIS — Z96642 Presence of left artificial hip joint: Secondary | ICD-10-CM | POA: Diagnosis not present

## 2018-03-15 ENCOUNTER — Other Ambulatory Visit: Payer: Self-pay | Admitting: Family Medicine

## 2018-03-15 DIAGNOSIS — F411 Generalized anxiety disorder: Secondary | ICD-10-CM

## 2018-03-18 ENCOUNTER — Other Ambulatory Visit: Payer: Self-pay

## 2018-03-18 ENCOUNTER — Inpatient Hospital Stay: Payer: Medicare HMO

## 2018-03-18 ENCOUNTER — Other Ambulatory Visit: Payer: Medicare HMO

## 2018-03-18 ENCOUNTER — Encounter: Payer: Self-pay | Admitting: Hematology & Oncology

## 2018-03-18 ENCOUNTER — Inpatient Hospital Stay: Payer: Medicare HMO | Attending: Hematology & Oncology | Admitting: Hematology & Oncology

## 2018-03-18 VITALS — BP 100/46 | HR 99 | Temp 98.6°F | Resp 18 | Wt 171.0 lb

## 2018-03-18 DIAGNOSIS — E1151 Type 2 diabetes mellitus with diabetic peripheral angiopathy without gangrene: Secondary | ICD-10-CM

## 2018-03-18 DIAGNOSIS — K760 Fatty (change of) liver, not elsewhere classified: Secondary | ICD-10-CM | POA: Insufficient documentation

## 2018-03-18 DIAGNOSIS — E1165 Type 2 diabetes mellitus with hyperglycemia: Secondary | ICD-10-CM

## 2018-03-18 DIAGNOSIS — I864 Gastric varices: Secondary | ICD-10-CM | POA: Diagnosis not present

## 2018-03-18 DIAGNOSIS — Z79899 Other long term (current) drug therapy: Secondary | ICD-10-CM | POA: Diagnosis not present

## 2018-03-18 DIAGNOSIS — E119 Type 2 diabetes mellitus without complications: Secondary | ICD-10-CM

## 2018-03-18 DIAGNOSIS — IMO0002 Reserved for concepts with insufficient information to code with codable children: Secondary | ICD-10-CM

## 2018-03-18 DIAGNOSIS — R161 Splenomegaly, not elsewhere classified: Secondary | ICD-10-CM | POA: Diagnosis not present

## 2018-03-18 DIAGNOSIS — D696 Thrombocytopenia, unspecified: Secondary | ICD-10-CM

## 2018-03-18 DIAGNOSIS — D500 Iron deficiency anemia secondary to blood loss (chronic): Secondary | ICD-10-CM

## 2018-03-18 DIAGNOSIS — K7460 Unspecified cirrhosis of liver: Secondary | ICD-10-CM | POA: Diagnosis not present

## 2018-03-18 DIAGNOSIS — D6959 Other secondary thrombocytopenia: Secondary | ICD-10-CM | POA: Insufficient documentation

## 2018-03-18 LAB — CBC WITH DIFFERENTIAL (CANCER CENTER ONLY)
Abs Immature Granulocytes: 0.02 10*3/uL (ref 0.00–0.07)
Basophils Absolute: 0 10*3/uL (ref 0.0–0.1)
Basophils Relative: 1 %
Eosinophils Absolute: 0.1 10*3/uL (ref 0.0–0.5)
Eosinophils Relative: 4 %
HCT: 32.1 % — ABNORMAL LOW (ref 36.0–46.0)
Hemoglobin: 9.7 g/dL — ABNORMAL LOW (ref 12.0–15.0)
Immature Granulocytes: 1 %
Lymphocytes Relative: 25 %
Lymphs Abs: 0.7 10*3/uL (ref 0.7–4.0)
MCH: 27.9 pg (ref 26.0–34.0)
MCHC: 30.2 g/dL (ref 30.0–36.0)
MCV: 92.2 fL (ref 80.0–100.0)
Monocytes Absolute: 0.3 10*3/uL (ref 0.1–1.0)
Monocytes Relative: 12 %
Neutro Abs: 1.7 10*3/uL (ref 1.7–7.7)
Neutrophils Relative %: 57 %
Platelet Count: 70 10*3/uL — ABNORMAL LOW (ref 150–400)
RBC: 3.48 MIL/uL — ABNORMAL LOW (ref 3.87–5.11)
RDW: 14.2 % (ref 11.5–15.5)
WBC Count: 2.9 10*3/uL — ABNORMAL LOW (ref 4.0–10.5)
nRBC: 0 % (ref 0.0–0.2)

## 2018-03-18 LAB — HEMOGLOBIN A1C
Hgb A1c MFr Bld: 6.3 % — ABNORMAL HIGH (ref 4.8–5.6)
Mean Plasma Glucose: 134.11 mg/dL

## 2018-03-18 LAB — CMP (CANCER CENTER ONLY)
ALT: 15 U/L (ref 0–44)
AST: 30 U/L (ref 15–41)
Albumin: 3.5 g/dL (ref 3.5–5.0)
Alkaline Phosphatase: 94 U/L (ref 38–126)
Anion gap: 8 (ref 5–15)
BUN: 25 mg/dL — ABNORMAL HIGH (ref 8–23)
CO2: 25 mmol/L (ref 22–32)
Calcium: 8.5 mg/dL — ABNORMAL LOW (ref 8.9–10.3)
Chloride: 105 mmol/L (ref 98–111)
Creatinine: 1.32 mg/dL — ABNORMAL HIGH (ref 0.44–1.00)
GFR, Est AFR Am: 43 mL/min — ABNORMAL LOW (ref >60)
GFR, Estimated: 37 mL/min — ABNORMAL LOW (ref >60)
Glucose, Bld: 256 mg/dL — ABNORMAL HIGH (ref 70–99)
Potassium: 4.2 mmol/L (ref 3.5–5.1)
Sodium: 138 mmol/L (ref 135–145)
Total Bilirubin: 1.2 mg/dL (ref 0.3–1.2)
Total Protein: 5.1 g/dL — ABNORMAL LOW (ref 6.5–8.1)

## 2018-03-18 LAB — RETICULOCYTES
Immature Retic Fract: 16.7 % — ABNORMAL HIGH (ref 2.3–15.9)
RBC.: 3.48 MIL/uL — ABNORMAL LOW (ref 3.87–5.11)
Retic Count, Absolute: 81.1 10*3/uL (ref 19.0–186.0)
Retic Ct Pct: 2.3 % (ref 0.4–3.1)

## 2018-03-18 LAB — PLATELET BY CITRATE

## 2018-03-18 NOTE — Progress Notes (Addendum)
Hematology and Oncology Follow Up Visit  Jessica Dennis [id number] 1932/11/13 85 y.o. 03/18/2018   Principle Diagnosis:  Chronic thrombocytopenia secondary to cirrhosis and splenomegaly Iron deficiency anemia secondary to GI bleed  Current Therapy:   IV iron as indicated- last received in May 2019 x 2 Red blood cell transfusion as needed for variceal bleeding- last received February 2019   Interim History:  [name] is here today for follow-up.  We typically see her every 6 months.  Since we last saw her, she has not been in the hospital.  She has not had any obvious bleeding.  Her problem still remains that being her diabetes.  She still has very poor blood sugar control.  She has had no issues with melena or bright red blood per rectum.  Her last iron studies that were done back in July 2019 showed a ferritin of 13 with an iron saturation of 9%.  She got IV iron at that time.  She does have varices.  Her appetite is doing okay.  Overall, her performance status is ECOG 2.   Medications:  Allergies as of 03/18/2018   No Known Allergies     Medication List       Accurate as of March 18, 2018 12:28 PM. Always use your most recent med list.        ACCU-CHEK FASTCLIX LANCETS Misc CHECK BLOOD SUGAR ONCE DAILY.   Calcium Carbonate-Vitamin D 600-200 MG-UNIT Caps Take by mouth.   doxycycline 100 MG tablet Commonly known as:  VIBRA-TABS Take 1 tablet (100 mg total) by mouth 2 (two) times daily. Can give caps or generic   Fish Oil 1000 MG Caps Take by mouth every morning.   Flax Seed Oil 1000 MG Caps Take by mouth.   gabapentin 100 MG capsule Commonly known as:  NEURONTIN Take 100 mg by mouth 3 (three) times daily.   glucose blood test strip Accu-Chek Fastclix Lancet Drum   latanoprost 0.005 % ophthalmic solution Commonly known as:  XALATAN Place 1 drop into both eyes at bedtime.   levothyroxine 75 MCG tablet Commonly known as:  SYNTHROID,  LEVOTHROID TAKE ONE TABLET BY MOUTH DAILY   LORazepam 0.5 MG tablet Commonly known as:  ATIVAN TAKE ONE TABLET BY MOUTH DAILY AS NEEDED   Melatonin 5 MG Caps Take 5 mg by mouth.   NOVOLOG MIX 70/30 [place] Inject into the skin. 35 units in the mornings and 25 at night   pantoprazole 40 MG tablet Commonly known as:  PROTONIX Take 1 tablet (40 mg total) by mouth daily. Needs ov   Prednicarbate 0.1 % Crea Apply topically 2 (two) times daily.   simvastatin 20 MG tablet Commonly known as:  ZOCOR TAKE ONE TABLET BY MOUTH AT BEDTIME - NEEDS OFFICE VISIT   spironolactone 50 MG tablet Commonly known as:  ALDACTONE TAKE TWO TABLETS BY MOUTH DAILY   SSD 1 % cream Generic drug:  silver sulfADIAZINE   tiZANidine 4 MG tablet Commonly known as:  ZANAFLEX TAKE ONE TABLET BY MOUTH EVERY NIGHT AT BEDTIME   vitamin C 1000 MG tablet Take 1,000 mg by mouth daily.   vitamin E 100 UNIT capsule Take by mouth daily.       Allergies: No Known Allergies  Past Medical History, Surgical history, Social history, and Family History were reviewed and updated.  Review of Systems: Review of Systems  Constitutional: Negative.   HENT: Negative.   Eyes: Negative.   Respiratory: Negative.   Cardiovascular: Negative.  Gastrointestinal: Negative.   Genitourinary: Negative.   Musculoskeletal: Negative.   Skin: Negative.   Neurological: Negative.   Endo/Heme/Allergies: Negative.   Psychiatric/Behavioral: Negative.      Physical Exam:  weight is 171 lb (77.6 kg). Her oral temperature is 98.6 F (37 C). Her blood pressure is 100/46 (abnormal) and her pulse is 99. Her respiration is 18 and oxygen saturation is 97%.   Wt Readings from Last 3 Encounters:  03/18/18 171 lb (77.6 kg)  02/14/18 169 lb 12.8 oz (77 kg)  02/07/18 171 lb (77.6 kg)    Physical Exam Vitals signs reviewed.  HENT:     Head: Normocephalic and atraumatic.  Eyes:     Pupils: Pupils are equal, round, and reactive to  light.  Neck:     Musculoskeletal: Normal range of motion.  Cardiovascular:     Rate and Rhythm: Normal rate and regular rhythm.     Heart sounds: Normal heart sounds.  Pulmonary:     Effort: Pulmonary effort is normal.     Breath sounds: Normal breath sounds.  Abdominal:     General: Bowel sounds are normal.     Palpations: Abdomen is soft.  Musculoskeletal: Normal range of motion.        General: No tenderness or deformity.  Lymphadenopathy:     Cervical: No cervical adenopathy.  Skin:    General: Skin is warm and dry.     Findings: No erythema or rash.  Neurological:     Mental Status: She is alert and oriented to person, place, and time.  Psychiatric:        Behavior: Behavior normal.        Thought Content: Thought content normal.        Judgment: Judgment normal.      Lab Results  Component Value Date   WBC 2.9 (L) 03/18/2018   HGB 9.7 (L) 03/18/2018   HCT 32.1 (L) 03/18/2018   MCV 92.2 03/18/2018   PLT 70 (L) 03/18/2018   Lab Results  Component Value Date   FERRITIN 58.1 02/05/2018   IRON 32 (L) 08/13/2017   TIBC 353 08/13/2017   UIBC 320 08/13/2017   IRONPCTSAT 9 (L) 08/13/2017   Lab Results  Component Value Date   RETICCTPCT 2.3 03/18/2018   RBC 3.48 (L) 03/18/2018   RBC 3.48 (L) 03/18/2018   No results found for: KPAFRELGTCHN, LAMBDASER, KAPLAMBRATIO No results found for: IGGSERUM, IGA, IGMSERUM No results found for: Odetta Pink, SPEI   Chemistry      Component Value Date/Time   NA 138 03/18/2018 1034   NA 138 12/18/2016 1314   NA 139 03/05/2016 1139   K 4.2 03/18/2018 1034   K 4.3 12/18/2016 1314   K 3.6 03/05/2016 1139   CL 105 03/18/2018 1034   CL 101 12/18/2016 1314   CO2 25 03/18/2018 1034   CO2 28 12/18/2016 1314   CO2 26 03/05/2016 1139   BUN 25 (H) 03/18/2018 1034   BUN 33 (H) 12/18/2016 1314   BUN 14.3 03/05/2016 1139   CREATININE 1.32 (H) 03/18/2018 1034   CREATININE 1.06  (H) 02/14/2018 1544   CREATININE 0.7 03/05/2016 1139      Component Value Date/Time   CALCIUM 8.5 (L) 03/18/2018 1034   CALCIUM 10.0 12/18/2016 1314   CALCIUM 9.3 03/05/2016 1139   ALKPHOS 94 03/18/2018 1034   ALKPHOS 94 (H) 12/18/2016 1314   ALKPHOS 127 03/05/2016 1139   AST 30  03/18/2018 1034   AST 29 03/05/2016 1139   ALT 15 03/18/2018 1034   ALT 26 12/18/2016 1314   ALT 22 03/05/2016 1139   BILITOT 1.2 03/18/2018 1034   BILITOT 1.75 (H) 03/05/2016 1139      Impression and Plan: Ms. Fear is a very pleasant 85 yo female with chronic thrombocytopenia and iron deficiency anemia secondary to intermittent GI blood loss.  It would not surprise me if her iron studies are low again.  I think her thrombocytopenia will always be an issue.  She has cirrhosis.  She has hepatic steatosis from her diabetes.  I would like to see her back in about 5-6 weeks.  I want to make sure that we are  following her closely.    Volanda Napoleon, MD 2/25/202012:28 PM

## 2018-03-19 ENCOUNTER — Telehealth: Payer: Self-pay | Admitting: Hematology & Oncology

## 2018-03-19 LAB — IRON AND TIBC
Iron: 31 ug/dL — ABNORMAL LOW (ref 41–142)
Saturation Ratios: 12 % — ABNORMAL LOW (ref 21–57)
TIBC: 256 ug/dL (ref 236–444)
UIBC: 225 ug/dL (ref 120–384)

## 2018-03-19 LAB — FERRITIN: Ferritin: 22 ng/mL (ref 11–307)

## 2018-03-19 NOTE — Telephone Encounter (Signed)
Called and spoke with patient regarding appointment being added for Iron infusion/ she is ok with date/time per 2/26 sch msg

## 2018-03-20 DIAGNOSIS — I872 Venous insufficiency (chronic) (peripheral): Secondary | ICD-10-CM | POA: Diagnosis not present

## 2018-03-20 DIAGNOSIS — E11621 Type 2 diabetes mellitus with foot ulcer: Secondary | ICD-10-CM | POA: Diagnosis not present

## 2018-03-20 DIAGNOSIS — S8011XA Contusion of right lower leg, initial encounter: Secondary | ICD-10-CM | POA: Diagnosis not present

## 2018-03-20 DIAGNOSIS — E1151 Type 2 diabetes mellitus with diabetic peripheral angiopathy without gangrene: Secondary | ICD-10-CM | POA: Diagnosis not present

## 2018-03-20 DIAGNOSIS — S81801A Unspecified open wound, right lower leg, initial encounter: Secondary | ICD-10-CM | POA: Diagnosis not present

## 2018-03-20 DIAGNOSIS — Z96642 Presence of left artificial hip joint: Secondary | ICD-10-CM | POA: Diagnosis not present

## 2018-03-20 DIAGNOSIS — R69 Illness, unspecified: Secondary | ICD-10-CM | POA: Diagnosis not present

## 2018-03-20 DIAGNOSIS — W2203XA Walked into furniture, initial encounter: Secondary | ICD-10-CM | POA: Diagnosis not present

## 2018-03-20 DIAGNOSIS — L97411 Non-pressure chronic ulcer of right heel and midfoot limited to breakdown of skin: Secondary | ICD-10-CM | POA: Diagnosis not present

## 2018-03-21 NOTE — Telephone Encounter (Signed)
Last written: 12/11/2017 Last ov: 02/14/18 Next ov: none Contract: 04/27/18 UDS: 04/27/18

## 2018-03-27 ENCOUNTER — Inpatient Hospital Stay: Payer: Medicare HMO | Attending: Hematology & Oncology

## 2018-03-27 ENCOUNTER — Other Ambulatory Visit: Payer: Self-pay

## 2018-03-27 VITALS — BP 144/68 | HR 97 | Temp 98.9°F | Resp 18

## 2018-03-27 DIAGNOSIS — D500 Iron deficiency anemia secondary to blood loss (chronic): Secondary | ICD-10-CM | POA: Insufficient documentation

## 2018-03-27 DIAGNOSIS — D6959 Other secondary thrombocytopenia: Secondary | ICD-10-CM | POA: Insufficient documentation

## 2018-03-27 DIAGNOSIS — R195 Other fecal abnormalities: Secondary | ICD-10-CM

## 2018-03-27 DIAGNOSIS — I864 Gastric varices: Secondary | ICD-10-CM | POA: Insufficient documentation

## 2018-03-27 DIAGNOSIS — K7460 Unspecified cirrhosis of liver: Secondary | ICD-10-CM | POA: Insufficient documentation

## 2018-03-27 DIAGNOSIS — K5520 Angiodysplasia of colon without hemorrhage: Secondary | ICD-10-CM

## 2018-03-27 DIAGNOSIS — K219 Gastro-esophageal reflux disease without esophagitis: Secondary | ICD-10-CM

## 2018-03-27 MED ORDER — SODIUM CHLORIDE 0.9 % IV SOLN
INTRAVENOUS | Status: DC
  Administered 2018-03-27: 13:00:00 via INTRAVENOUS
  Filled 2018-03-27: qty 250

## 2018-03-27 MED ORDER — SODIUM CHLORIDE 0.9 % IV SOLN
510.0000 mg | Freq: Once | INTRAVENOUS | Status: AC
  Administered 2018-03-27: 510 mg via INTRAVENOUS
  Filled 2018-03-27: qty 17

## 2018-03-27 NOTE — Progress Notes (Signed)
Patient does not want to stay for the 30 minute post Iron observation. Patient discharged ambulatory without complaints or concerns.  

## 2018-03-27 NOTE — Patient Instructions (Signed)

## 2018-03-28 ENCOUNTER — Other Ambulatory Visit: Payer: Self-pay | Admitting: Family Medicine

## 2018-03-28 DIAGNOSIS — K219 Gastro-esophageal reflux disease without esophagitis: Secondary | ICD-10-CM

## 2018-04-22 ENCOUNTER — Ambulatory Visit: Payer: Medicare HMO | Admitting: Hematology & Oncology

## 2018-04-22 ENCOUNTER — Other Ambulatory Visit: Payer: Medicare HMO

## 2018-04-23 ENCOUNTER — Inpatient Hospital Stay: Payer: Medicare HMO

## 2018-04-23 ENCOUNTER — Inpatient Hospital Stay: Payer: Medicare HMO | Admitting: Hematology & Oncology

## 2018-05-16 ENCOUNTER — Inpatient Hospital Stay: Payer: Medicare HMO | Attending: Hematology & Oncology

## 2018-05-16 ENCOUNTER — Other Ambulatory Visit: Payer: Self-pay

## 2018-05-16 ENCOUNTER — Encounter: Payer: Self-pay | Admitting: Family

## 2018-05-16 ENCOUNTER — Inpatient Hospital Stay (HOSPITAL_BASED_OUTPATIENT_CLINIC_OR_DEPARTMENT_OTHER): Payer: Medicare HMO | Admitting: Family

## 2018-05-16 VITALS — BP 134/64 | HR 78 | Temp 98.5°F | Resp 18 | Ht 65.0 in | Wt 169.8 lb

## 2018-05-16 DIAGNOSIS — D696 Thrombocytopenia, unspecified: Secondary | ICD-10-CM

## 2018-05-16 DIAGNOSIS — G629 Polyneuropathy, unspecified: Secondary | ICD-10-CM | POA: Insufficient documentation

## 2018-05-16 DIAGNOSIS — K7460 Unspecified cirrhosis of liver: Secondary | ICD-10-CM | POA: Diagnosis not present

## 2018-05-16 DIAGNOSIS — D500 Iron deficiency anemia secondary to blood loss (chronic): Secondary | ICD-10-CM

## 2018-05-16 DIAGNOSIS — R161 Splenomegaly, not elsewhere classified: Secondary | ICD-10-CM | POA: Insufficient documentation

## 2018-05-16 DIAGNOSIS — K922 Gastrointestinal hemorrhage, unspecified: Secondary | ICD-10-CM

## 2018-05-16 DIAGNOSIS — D6959 Other secondary thrombocytopenia: Secondary | ICD-10-CM | POA: Diagnosis not present

## 2018-05-16 LAB — CBC WITH DIFFERENTIAL (CANCER CENTER ONLY)
Abs Immature Granulocytes: 0.01 10*3/uL (ref 0.00–0.07)
Basophils Absolute: 0 10*3/uL (ref 0.0–0.1)
Basophils Relative: 1 %
Eosinophils Absolute: 0.1 10*3/uL (ref 0.0–0.5)
Eosinophils Relative: 3 %
HCT: 34.7 % — ABNORMAL LOW (ref 36.0–46.0)
Hemoglobin: 11 g/dL — ABNORMAL LOW (ref 12.0–15.0)
Immature Granulocytes: 0 %
Lymphocytes Relative: 13 %
Lymphs Abs: 0.5 10*3/uL — ABNORMAL LOW (ref 0.7–4.0)
MCH: 28.8 pg (ref 26.0–34.0)
MCHC: 31.7 g/dL (ref 30.0–36.0)
MCV: 90.8 fL (ref 80.0–100.0)
Monocytes Absolute: 0.3 10*3/uL (ref 0.1–1.0)
Monocytes Relative: 7 %
Neutro Abs: 3 10*3/uL (ref 1.7–7.7)
Neutrophils Relative %: 76 %
Platelet Count: 67 10*3/uL — ABNORMAL LOW (ref 150–400)
RBC: 3.82 MIL/uL — ABNORMAL LOW (ref 3.87–5.11)
RDW: 18.6 % — ABNORMAL HIGH (ref 11.5–15.5)
WBC Count: 4 10*3/uL (ref 4.0–10.5)
nRBC: 0 % (ref 0.0–0.2)

## 2018-05-16 LAB — CMP (CANCER CENTER ONLY)
ALT: 24 U/L (ref 0–44)
AST: 40 U/L (ref 15–41)
Albumin: 3.9 g/dL (ref 3.5–5.0)
Alkaline Phosphatase: 133 U/L — ABNORMAL HIGH (ref 38–126)
Anion gap: 7 (ref 5–15)
BUN: 22 mg/dL (ref 8–23)
CO2: 25 mmol/L (ref 22–32)
Calcium: 9.6 mg/dL (ref 8.9–10.3)
Chloride: 103 mmol/L (ref 98–111)
Creatinine: 1.06 mg/dL — ABNORMAL HIGH (ref 0.44–1.00)
GFR, Est AFR Am: 56 mL/min — ABNORMAL LOW (ref >60)
GFR, Estimated: 49 mL/min — ABNORMAL LOW (ref >60)
Glucose, Bld: 272 mg/dL — ABNORMAL HIGH (ref 70–99)
Potassium: 4.7 mmol/L (ref 3.5–5.1)
Sodium: 135 mmol/L (ref 135–145)
Total Bilirubin: 1.2 mg/dL (ref 0.3–1.2)
Total Protein: 6.1 g/dL — ABNORMAL LOW (ref 6.5–8.1)

## 2018-05-16 LAB — SAMPLE TO BLOOD BANK

## 2018-05-16 NOTE — Progress Notes (Signed)
Hematology and Oncology Follow Up Visit  Jessica Dennis [id number] January 24, 1932 85 y.o. 05/16/2018   Principle Diagnosis:  Chronic thrombocytopenia secondary to cirrhosis and splenomegaly Iron deficiency anemia secondary to GI bleed  Current Therapy:   IV iron as indicated- last received inMay2019 x 2 Red blood cell transfusion as needed for variceal bleeding- last received February 2019   Interim History:  Jessica Dennis is here today for follow-up. She is doing well but states that she did have an episodes where she stood up to get out of bed and fell into the floor a month and a half ago. She states that she has weak knees and she "got up too quick". She is now being careful and taking her time when getting up out of bed or from sitting in a chair. Thankfully she has not had any other episodes.  She is trying to stay active walking her dog daily for exercise.    She denies having any episodes of bleeding. Her skin is thin and she does bruise easily.  Her Hgb is improved at 11.0 and platelet count is 67.  No fever, chills, n/v, cough, rash, dizziness, SOB, chest pain, palpitations, abdominal pain or changes in bowel or bladder habits.  The puffiness in her ankles waxes and wanes.  She does have neuropathy in her feet which is unchanged.  No lymphadenopathy noted on exam.  She is eating well and staying hydrated. Her weight is stable.   ECOG Performance Status: 1 - Symptomatic but completely ambulatory  Medications:  Allergies as of 05/16/2018   No Known Allergies     Medication List       Accurate as of May 16, 2018  2:27 PM. Always use your most recent med list.        Accu-Chek FastClix Lancets Misc CHECK BLOOD SUGAR ONCE DAILY.   Calcium Carbonate-Vitamin D 600-200 MG-UNIT Caps Take by mouth.   doxycycline 100 MG tablet Commonly known as:  VIBRA-TABS Take 1 tablet (100 mg total) by mouth 2 (two) times daily. Can give caps or generic   Fish Oil 1000 MG Caps Take  by mouth every morning.   Flax Seed Oil 1000 MG Caps Take by mouth.   gabapentin 100 MG capsule Commonly known as:  NEURONTIN Take 100 mg by mouth 3 (three) times daily.   glucose blood test strip Accu-Chek Fastclix Lancet Drum   latanoprost 0.005 % ophthalmic solution Commonly known as:  XALATAN Place 1 drop into both eyes at bedtime.   levothyroxine 75 MCG tablet Commonly known as:  SYNTHROID TAKE ONE TABLET BY MOUTH DAILY   LORazepam 0.5 MG tablet Commonly known as:  ATIVAN TAKE ONE TABLET BY MOUTH DAILY AS NEEDED   Melatonin 5 MG Caps Take 5 mg by mouth.   NOVOLOG MIX 70/30 [place] Inject into the skin. 35 units in the mornings and 25 at night   pantoprazole 40 MG tablet Commonly known as:  PROTONIX TAKE ONE TABLET BY MOUTH DAILY - NEEDS OFFICE VISIT   Prednicarbate 0.1 % Crea Apply topically 2 (two) times daily.   simvastatin 20 MG tablet Commonly known as:  ZOCOR TAKE ONE TABLET BY MOUTH AT BEDTIME - NEEDS OFFICE VISIT   spironolactone 50 MG tablet Commonly known as:  ALDACTONE TAKE TWO TABLETS BY MOUTH DAILY   SSD 1 % cream Generic drug:  silver sulfADIAZINE   tiZANidine 4 MG tablet Commonly known as:  ZANAFLEX TAKE ONE TABLET BY MOUTH EVERY NIGHT AT BEDTIME  vitamin C 1000 MG tablet Take 1,000 mg by mouth daily.   vitamin E 100 UNIT capsule Take by mouth daily.       Allergies: No Known Allergies  Past Medical History, Surgical history, Social history, and Family History were reviewed and updated.  Review of Systems: All other 10 point review of systems is negative.   Physical Exam:  vitals were not taken for this visit.   Wt Readings from Last 3 Encounters:  03/18/18 171 lb (77.6 kg)  02/14/18 169 lb 12.8 oz (77 kg)  02/07/18 171 lb (77.6 kg)    Ocular: Sclerae unicteric, pupils equal, round and reactive to light Ear-nose-throat: Oropharynx clear, dentition fair Lymphatic: No cervical or supraclavicular adenopathy Lungs no rales or  rhonchi, good excursion bilaterally Heart regular rate and rhythm, no murmur appreciated Abd soft, nontender, positive bowel sounds, no liver or spleen tip palpated on exam, no fluid wave MSK no focal spinal tenderness, no joint edema Neuro: non-focal, well-oriented, appropriate affect Breasts: Deferred   Lab Results  Component Value Date   WBC 4.0 05/16/2018   HGB 11.0 (L) 05/16/2018   HCT 34.7 (L) 05/16/2018   MCV 90.8 05/16/2018   PLT 67 (L) 05/16/2018   Lab Results  Component Value Date   FERRITIN 22 03/18/2018   IRON 31 (L) 03/18/2018   TIBC 256 03/18/2018   UIBC 225 03/18/2018   IRONPCTSAT 12 (L) 03/18/2018   Lab Results  Component Value Date   RETICCTPCT 2.3 03/18/2018   RBC 3.82 (L) 05/16/2018   No results found for: KPAFRELGTCHN, LAMBDASER, KAPLAMBRATIO No results found for: IGGSERUM, IGA, IGMSERUM No results found for: Odetta Pink, SPEI   Chemistry      Component Value Date/Time   NA 138 03/18/2018 1034   NA 138 12/18/2016 1314   NA 139 03/05/2016 1139   K 4.2 03/18/2018 1034   K 4.3 12/18/2016 1314   K 3.6 03/05/2016 1139   CL 105 03/18/2018 1034   CL 101 12/18/2016 1314   CO2 25 03/18/2018 1034   CO2 28 12/18/2016 1314   CO2 26 03/05/2016 1139   BUN 25 (H) 03/18/2018 1034   BUN 33 (H) 12/18/2016 1314   BUN 14.3 03/05/2016 1139   CREATININE 1.32 (H) 03/18/2018 1034   CREATININE 1.06 (H) 02/14/2018 1544   CREATININE 0.7 03/05/2016 1139      Component Value Date/Time   CALCIUM 8.5 (L) 03/18/2018 1034   CALCIUM 10.0 12/18/2016 1314   CALCIUM 9.3 03/05/2016 1139   ALKPHOS 94 03/18/2018 1034   ALKPHOS 94 (H) 12/18/2016 1314   ALKPHOS 127 03/05/2016 1139   AST 30 03/18/2018 1034   AST 29 03/05/2016 1139   ALT 15 03/18/2018 1034   ALT 26 12/18/2016 1314   ALT 22 03/05/2016 1139   BILITOT 1.2 03/18/2018 1034   BILITOT 1.75 (H) 03/05/2016 1139       Impression and Plan: [name] is a very  pleasant 85 yo [place] female with chronic thrombocytopenia and iron deficiency anemia secondary to intermittent GI blood loss. She is doing well and has no complaints at this time.  We will see what her iron studies show and bring her back in for infusion if needed.  We'll plan to see her back in another 6 weeks.  She will contact our office with any questions or concerns. We can certainly see her sooner if need be.   [name], NP 4/24/20202:27 PM

## 2018-05-19 DIAGNOSIS — L821 Other seborrheic keratosis: Secondary | ICD-10-CM | POA: Diagnosis not present

## 2018-05-19 DIAGNOSIS — C44722 Squamous cell carcinoma of skin of right lower limb, including hip: Secondary | ICD-10-CM | POA: Diagnosis not present

## 2018-05-19 LAB — IRON AND TIBC
Iron: 66 ug/dL (ref 41–142)
Saturation Ratios: 22 % (ref 21–57)
TIBC: 294 ug/dL (ref 236–444)
UIBC: 228 ug/dL (ref 120–384)

## 2018-05-19 LAB — FERRITIN: Ferritin: 34 ng/mL (ref 11–307)

## 2018-05-22 ENCOUNTER — Telehealth: Payer: Self-pay | Admitting: Hematology & Oncology

## 2018-05-22 NOTE — Telephone Encounter (Signed)
Appointments scheduled letter/calendar mailed  °

## 2018-06-01 ENCOUNTER — Other Ambulatory Visit: Payer: Self-pay | Admitting: Family Medicine

## 2018-06-18 ENCOUNTER — Other Ambulatory Visit: Payer: Self-pay | Admitting: Family Medicine

## 2018-06-18 DIAGNOSIS — F411 Generalized anxiety disorder: Secondary | ICD-10-CM

## 2018-06-18 NOTE — Telephone Encounter (Signed)
Requesting: Ativan Contract: 04/26/2017 UDS: 04/26/2017, low risk, next screen 04/27/2018 Last OV: 02/14/2018 Next OV: N/A Last Refill: 03/21/2018, #[id number] RF Database:   Please advise

## 2018-06-26 ENCOUNTER — Other Ambulatory Visit: Payer: Self-pay

## 2018-06-26 ENCOUNTER — Encounter: Payer: Self-pay | Admitting: Hematology & Oncology

## 2018-06-26 ENCOUNTER — Inpatient Hospital Stay (HOSPITAL_BASED_OUTPATIENT_CLINIC_OR_DEPARTMENT_OTHER): Payer: Medicare HMO | Admitting: Hematology & Oncology

## 2018-06-26 ENCOUNTER — Inpatient Hospital Stay: Payer: Medicare HMO | Attending: Hematology & Oncology

## 2018-06-26 VITALS — BP 137/58 | HR 80 | Temp 98.4°F | Resp 19 | Wt 170.0 lb

## 2018-06-26 DIAGNOSIS — D696 Thrombocytopenia, unspecified: Secondary | ICD-10-CM

## 2018-06-26 DIAGNOSIS — D6959 Other secondary thrombocytopenia: Secondary | ICD-10-CM | POA: Insufficient documentation

## 2018-06-26 DIAGNOSIS — K760 Fatty (change of) liver, not elsewhere classified: Secondary | ICD-10-CM | POA: Diagnosis not present

## 2018-06-26 DIAGNOSIS — Z79899 Other long term (current) drug therapy: Secondary | ICD-10-CM

## 2018-06-26 DIAGNOSIS — E119 Type 2 diabetes mellitus without complications: Secondary | ICD-10-CM | POA: Diagnosis not present

## 2018-06-26 DIAGNOSIS — K7460 Unspecified cirrhosis of liver: Secondary | ICD-10-CM | POA: Diagnosis not present

## 2018-06-26 DIAGNOSIS — D500 Iron deficiency anemia secondary to blood loss (chronic): Secondary | ICD-10-CM | POA: Insufficient documentation

## 2018-06-26 DIAGNOSIS — K922 Gastrointestinal hemorrhage, unspecified: Secondary | ICD-10-CM

## 2018-06-26 LAB — CBC WITH DIFFERENTIAL (CANCER CENTER ONLY)
Abs Immature Granulocytes: 0.02 10*3/uL (ref 0.00–0.07)
Basophils Absolute: 0 10*3/uL (ref 0.0–0.1)
Basophils Relative: 0 %
Eosinophils Absolute: 0.1 10*3/uL (ref 0.0–0.5)
Eosinophils Relative: 2 %
HCT: 29.6 % — ABNORMAL LOW (ref 36.0–46.0)
Hemoglobin: 9.2 g/dL — ABNORMAL LOW (ref 12.0–15.0)
Immature Granulocytes: 0 %
Lymphocytes Relative: 12 %
Lymphs Abs: 0.6 10*3/uL — ABNORMAL LOW (ref 0.7–4.0)
MCH: 28.1 pg (ref 26.0–34.0)
MCHC: 31.1 g/dL (ref 30.0–36.0)
MCV: 90.5 fL (ref 80.0–100.0)
Monocytes Absolute: 0.3 10*3/uL (ref 0.1–1.0)
Monocytes Relative: 6 %
Neutro Abs: 3.9 10*3/uL (ref 1.7–7.7)
Neutrophils Relative %: 80 %
Platelet Count: 68 10*3/uL — ABNORMAL LOW (ref 150–400)
RBC: 3.27 MIL/uL — ABNORMAL LOW (ref 3.87–5.11)
RDW: 13.6 % (ref 11.5–15.5)
WBC Count: 4.9 10*3/uL (ref 4.0–10.5)
nRBC: 0 % (ref 0.0–0.2)

## 2018-06-26 LAB — CMP (CANCER CENTER ONLY)
ALT: 17 U/L (ref 0–44)
AST: 27 U/L (ref 15–41)
Albumin: 4 g/dL (ref 3.5–5.0)
Alkaline Phosphatase: 121 U/L (ref 38–126)
Anion gap: 8 (ref 5–15)
BUN: 21 mg/dL (ref 8–23)
CO2: 22 mmol/L (ref 22–32)
Calcium: 9.3 mg/dL (ref 8.9–10.3)
Chloride: 105 mmol/L (ref 98–111)
Creatinine: 1.09 mg/dL — ABNORMAL HIGH (ref 0.44–1.00)
GFR, Est AFR Am: 54 mL/min — ABNORMAL LOW (ref >60)
GFR, Estimated: 47 mL/min — ABNORMAL LOW (ref >60)
Glucose, Bld: 313 mg/dL — ABNORMAL HIGH (ref 70–99)
Potassium: 5 mmol/L (ref 3.5–5.1)
Sodium: 135 mmol/L (ref 135–145)
Total Bilirubin: 1.5 mg/dL — ABNORMAL HIGH (ref 0.3–1.2)
Total Protein: 6.4 g/dL — ABNORMAL LOW (ref 6.5–8.1)

## 2018-06-26 LAB — SAVE SMEAR(SSMR), FOR PROVIDER SLIDE REVIEW

## 2018-06-26 NOTE — Progress Notes (Signed)
Hematology and Oncology Follow Up Visit  Jessica Dennis [id number] May 13, 1932 85 y.o. 06/26/2018   Principle Diagnosis:  Chronic thrombocytopenia secondary to cirrhosis and splenomegaly Iron deficiency anemia secondary to GI bleed  Current Therapy:   IV iron as indicated- last received in  March 2020 Red blood cell transfusion as needed for variceal bleeding- last received February 2019   Interim History:  Jessica Dennis is here today for follow-up.  She is doing okay.  She really has had no specific complaints.  She last got iron back in May 2019.  When we last saw her in April 2020, her ferritin was 34 with an iron saturation of 22%.  There is been no problems with her bowels or bladder.  She is eating well.  She has had no problems with cough or shortness of breath.  There has been no leg swelling.  Overall, her performance status is ECOG 1.   Medications:  Allergies as of 06/26/2018   No Known Allergies     Medication List       Accurate as of June 26, 2018  2:42 PM. If you have any questions, ask your nurse or doctor.        Accu-Chek FastClix Lancets Misc CHECK BLOOD SUGAR ONCE DAILY.   ammonium lactate 12 % cream Commonly known as:  AMLACTIN   Calcium Carbonate-Vitamin D 600-200 MG-UNIT Caps Take by mouth.   Fish Oil 1000 MG Caps Take by mouth every morning.   Flax Seed Oil 1000 MG Caps Take by mouth.   gabapentin 100 MG capsule Commonly known as:  NEURONTIN Take 100 mg by mouth 3 (three) times daily.   glucose blood test strip Accu-Chek Fastclix Lancet Drum   latanoprost 0.005 % ophthalmic solution Commonly known as:  XALATAN Place 1 drop into both eyes at bedtime.   levothyroxine 75 MCG tablet Commonly known as:  SYNTHROID TAKE ONE TABLET BY MOUTH DAILY   LORazepam 0.5 MG tablet Commonly known as:  ATIVAN TAKE ONE TABLET BY MOUTH DAILY AS NEEDED   Melatonin 5 MG Caps Take 5 mg by mouth.   NOVOLOG MIX 70/30  Inject into the skin. 35  units in the mornings and 25 at night   pantoprazole 40 MG tablet Commonly known as:  PROTONIX TAKE ONE TABLET BY MOUTH DAILY - NEEDS OFFICE VISIT   Prednicarbate 0.1 % Crea Apply topically 2 (two) times daily.   simvastatin 20 MG tablet Commonly known as:  ZOCOR TAKE ONE TABLET BY MOUTH AT BEDTIME - NEEDS OFFICE VISIT   spironolactone 50 MG tablet Commonly known as:  ALDACTONE TAKE TWO TABLETS BY MOUTH DAILY   SSD 1 % cream Generic drug:  silver sulfADIAZINE   tiZANidine 4 MG tablet Commonly known as:  ZANAFLEX TAKE ONE TABLET BY MOUTH EVERY NIGHT AT BEDTIME   vitamin C 1000 MG tablet Take 1,000 mg by mouth daily.   vitamin E 100 UNIT capsule Take by mouth daily.       Allergies: No Known Allergies  Past Medical History, Surgical history, Social history, and Family History were reviewed and updated.  Review of Systems: Review of Systems  Constitutional: Negative.  Is HENT: Negative.   Eyes: Negative.   Respiratory: Negative.   Cardiovascular: Negative.   Gastrointestinal: Negative.   Genitourinary: Negative.   Musculoskeletal: Negative.   Skin: Negative.   Neurological: Negative.   Endo/Heme/Allergies: Negative.   Psychiatric/Behavioral: Negative.      Physical Exam:  weight is 170 lb (77.1  kg). Her oral temperature is 98.4 F (36.9 C). Her blood pressure is 137/58 (abnormal) and her pulse is 80. Her respiration is 19 and oxygen saturation is 100%.   Wt Readings from Last 3 Encounters:  06/26/18 170 lb (77.1 kg)  05/16/18 169 lb 12.8 oz (77 kg)  03/18/18 171 lb (77.6 kg)    Physical Exam Vitals signs reviewed.  HENT:     Head: Normocephalic and atraumatic.  Eyes:     Pupils: Pupils are equal, round, and reactive to light.  Neck:     Musculoskeletal: Normal range of motion.  Cardiovascular:     Rate and Rhythm: Normal rate and regular rhythm.     Heart sounds: Normal heart sounds.  Pulmonary:     Effort: Pulmonary effort is normal.      Breath sounds: Normal breath sounds.  Abdominal:     General: Bowel sounds are normal.     Palpations: Abdomen is soft.  Musculoskeletal: Normal range of motion.        General: No tenderness or deformity.  Lymphadenopathy:     Cervical: No cervical adenopathy.  Skin:    General: Skin is warm and dry.     Findings: No erythema or rash.  Neurological:     Mental Status: She is alert and oriented to person, place, and time.  Psychiatric:        Behavior: Behavior normal.        Thought Content: Thought content normal.        Judgment: Judgment normal.      Lab Results  Component Value Date   WBC 4.9 06/26/2018   HGB 9.2 (L) 06/26/2018   HCT 29.6 (L) 06/26/2018   MCV 90.5 06/26/2018   PLT 68 (L) 06/26/2018   Lab Results  Component Value Date   FERRITIN 34 05/16/2018   IRON 66 05/16/2018   TIBC 294 05/16/2018   UIBC 228 05/16/2018   IRONPCTSAT 22 05/16/2018   Lab Results  Component Value Date   RETICCTPCT 2.3 03/18/2018   RBC 3.27 (L) 06/26/2018   No results found for: KPAFRELGTCHN, LAMBDASER, KAPLAMBRATIO No results found for: IGGSERUM, IGA, IGMSERUM No results found for: Odetta Pink, SPEI   Chemistry      Component Value Date/Time   NA 135 06/26/2018 1327   NA 138 12/18/2016 1314   NA 139 03/05/2016 1139   K 5.0 06/26/2018 1327   K 4.3 12/18/2016 1314   K 3.6 03/05/2016 1139   CL 105 06/26/2018 1327   CL 101 12/18/2016 1314   CO2 22 06/26/2018 1327   CO2 28 12/18/2016 1314   CO2 26 03/05/2016 1139   BUN 21 06/26/2018 1327   BUN 33 (H) 12/18/2016 1314   BUN 14.3 03/05/2016 1139   CREATININE 1.09 (H) 06/26/2018 1327   CREATININE 1.06 (H) 02/14/2018 1544   CREATININE 0.7 03/05/2016 1139      Component Value Date/Time   CALCIUM 9.3 06/26/2018 1327   CALCIUM 10.0 12/18/2016 1314   CALCIUM 9.3 03/05/2016 1139   ALKPHOS 121 06/26/2018 1327   ALKPHOS 94 (H) 12/18/2016 1314   ALKPHOS 127 03/05/2016  1139   AST 27 06/26/2018 1327   AST 29 03/05/2016 1139   ALT 17 06/26/2018 1327   ALT 26 12/18/2016 1314   ALT 22 03/05/2016 1139   BILITOT 1.5 (H) 06/26/2018 1327   BILITOT 1.75 (H) 03/05/2016 1139      Impression and Plan: Jessica Dennis  is a very pleasant 85 yo female with chronic thrombocytopenia and iron deficiency anemia secondary to intermittent GI blood loss.  It would not surprise me if her iron studies are low again.  I do not think her thrombocytopenia will always be an issue.  She has cirrhosis.  She has hepatic steatosis from her diabetes.  I would like to see her back in about 5-6 weeks.  I want to make sure that we are  following her closely.    Volanda Napoleon, MD 6/4/20202:42 PM

## 2018-06-27 ENCOUNTER — Telehealth: Payer: Self-pay | Admitting: Hematology & Oncology

## 2018-06-27 LAB — IRON AND TIBC
Iron: 44 ug/dL (ref 41–142)
Saturation Ratios: 13 % — ABNORMAL LOW (ref 21–57)
TIBC: 350 ug/dL (ref 236–444)
UIBC: 306 ug/dL (ref 120–384)

## 2018-06-27 LAB — FERRITIN: Ferritin: 10 ng/mL — ABNORMAL LOW (ref 11–307)

## 2018-06-27 NOTE — Telephone Encounter (Signed)
lvm to inform pt of iron appt 6/10 at 0900

## 2018-06-27 NOTE — Telephone Encounter (Signed)
lvm to inform pt of 8/5 appt at 1130 am

## 2018-07-02 ENCOUNTER — Inpatient Hospital Stay: Payer: Medicare HMO

## 2018-07-02 ENCOUNTER — Other Ambulatory Visit: Payer: Self-pay

## 2018-07-02 VITALS — BP 110/58 | HR 67 | Temp 98.1°F | Resp 20

## 2018-07-02 DIAGNOSIS — R195 Other fecal abnormalities: Secondary | ICD-10-CM

## 2018-07-02 DIAGNOSIS — D6959 Other secondary thrombocytopenia: Secondary | ICD-10-CM | POA: Diagnosis not present

## 2018-07-02 DIAGNOSIS — K219 Gastro-esophageal reflux disease without esophagitis: Secondary | ICD-10-CM

## 2018-07-02 DIAGNOSIS — K922 Gastrointestinal hemorrhage, unspecified: Secondary | ICD-10-CM | POA: Diagnosis not present

## 2018-07-02 DIAGNOSIS — D500 Iron deficiency anemia secondary to blood loss (chronic): Secondary | ICD-10-CM

## 2018-07-02 DIAGNOSIS — K760 Fatty (change of) liver, not elsewhere classified: Secondary | ICD-10-CM | POA: Diagnosis not present

## 2018-07-02 DIAGNOSIS — K5520 Angiodysplasia of colon without hemorrhage: Secondary | ICD-10-CM

## 2018-07-02 DIAGNOSIS — E119 Type 2 diabetes mellitus without complications: Secondary | ICD-10-CM | POA: Diagnosis not present

## 2018-07-02 DIAGNOSIS — Z79899 Other long term (current) drug therapy: Secondary | ICD-10-CM | POA: Diagnosis not present

## 2018-07-02 DIAGNOSIS — K7460 Unspecified cirrhosis of liver: Secondary | ICD-10-CM | POA: Diagnosis not present

## 2018-07-02 MED ORDER — SODIUM CHLORIDE 0.9 % IV SOLN
510.0000 mg | Freq: Once | INTRAVENOUS | Status: AC
  Administered 2018-07-02: 510 mg via INTRAVENOUS
  Filled 2018-07-02: qty 17

## 2018-07-02 MED ORDER — SODIUM CHLORIDE 0.9 % IV SOLN
Freq: Once | INTRAVENOUS | Status: AC
  Administered 2018-07-02: 09:00:00 via INTRAVENOUS
  Filled 2018-07-02: qty 250

## 2018-07-02 NOTE — Patient Instructions (Signed)

## 2018-07-09 ENCOUNTER — Inpatient Hospital Stay: Payer: Medicare HMO

## 2018-08-04 ENCOUNTER — Other Ambulatory Visit: Payer: Self-pay | Admitting: Family Medicine

## 2018-08-04 DIAGNOSIS — R252 Cramp and spasm: Secondary | ICD-10-CM

## 2018-08-27 ENCOUNTER — Inpatient Hospital Stay: Payer: Medicare HMO | Attending: Hematology & Oncology | Admitting: Family

## 2018-08-27 ENCOUNTER — Encounter: Payer: Self-pay | Admitting: Hematology & Oncology

## 2018-08-27 ENCOUNTER — Inpatient Hospital Stay: Payer: Medicare HMO

## 2018-08-27 ENCOUNTER — Other Ambulatory Visit: Payer: Self-pay

## 2018-08-27 VITALS — BP 137/56 | HR 92 | Temp 97.4°F | Resp 20 | Wt 175.0 lb

## 2018-08-27 DIAGNOSIS — R161 Splenomegaly, not elsewhere classified: Secondary | ICD-10-CM | POA: Diagnosis not present

## 2018-08-27 DIAGNOSIS — D649 Anemia, unspecified: Secondary | ICD-10-CM

## 2018-08-27 DIAGNOSIS — K922 Gastrointestinal hemorrhage, unspecified: Secondary | ICD-10-CM | POA: Diagnosis not present

## 2018-08-27 DIAGNOSIS — D500 Iron deficiency anemia secondary to blood loss (chronic): Secondary | ICD-10-CM | POA: Insufficient documentation

## 2018-08-27 DIAGNOSIS — D6959 Other secondary thrombocytopenia: Secondary | ICD-10-CM | POA: Diagnosis not present

## 2018-08-27 DIAGNOSIS — K7460 Unspecified cirrhosis of liver: Secondary | ICD-10-CM | POA: Diagnosis not present

## 2018-08-27 DIAGNOSIS — G629 Polyneuropathy, unspecified: Secondary | ICD-10-CM | POA: Diagnosis not present

## 2018-08-27 DIAGNOSIS — Z79899 Other long term (current) drug therapy: Secondary | ICD-10-CM | POA: Diagnosis not present

## 2018-08-27 LAB — CBC WITH DIFFERENTIAL (CANCER CENTER ONLY)
Abs Immature Granulocytes: 0.01 10*3/uL (ref 0.00–0.07)
Basophils Absolute: 0 10*3/uL (ref 0.0–0.1)
Basophils Relative: 1 %
Eosinophils Absolute: 0.1 10*3/uL (ref 0.0–0.5)
Eosinophils Relative: 2 %
HCT: 26.1 % — ABNORMAL LOW (ref 36.0–46.0)
Hemoglobin: 7.9 g/dL — ABNORMAL LOW (ref 12.0–15.0)
Immature Granulocytes: 0 %
Lymphocytes Relative: 18 %
Lymphs Abs: 0.6 10*3/uL — ABNORMAL LOW (ref 0.7–4.0)
MCH: 26.5 pg (ref 26.0–34.0)
MCHC: 30.3 g/dL (ref 30.0–36.0)
MCV: 87.6 fL (ref 80.0–100.0)
Monocytes Absolute: 0.3 10*3/uL (ref 0.1–1.0)
Monocytes Relative: 8 %
Neutro Abs: 2.3 10*3/uL (ref 1.7–7.7)
Neutrophils Relative %: 71 %
Platelet Count: 66 10*3/uL — ABNORMAL LOW (ref 150–400)
RBC: 2.98 MIL/uL — ABNORMAL LOW (ref 3.87–5.11)
RDW: 15.4 % (ref 11.5–15.5)
WBC Count: 3.3 10*3/uL — ABNORMAL LOW (ref 4.0–10.5)
nRBC: 0 % (ref 0.0–0.2)

## 2018-08-27 LAB — CMP (CANCER CENTER ONLY)
ALT: 17 U/L (ref 0–44)
AST: 32 U/L (ref 15–41)
Albumin: 3.7 g/dL (ref 3.5–5.0)
Alkaline Phosphatase: 103 U/L (ref 38–126)
Anion gap: 7 (ref 5–15)
BUN: 21 mg/dL (ref 8–23)
CO2: 24 mmol/L (ref 22–32)
Calcium: 8.8 mg/dL — ABNORMAL LOW (ref 8.9–10.3)
Chloride: 106 mmol/L (ref 98–111)
Creatinine: 1.06 mg/dL — ABNORMAL HIGH (ref 0.44–1.00)
GFR, Est AFR Am: 56 mL/min — ABNORMAL LOW (ref >60)
GFR, Estimated: 49 mL/min — ABNORMAL LOW (ref >60)
Glucose, Bld: 153 mg/dL — ABNORMAL HIGH (ref 70–99)
Potassium: 5.2 mmol/L — ABNORMAL HIGH (ref 3.5–5.1)
Sodium: 137 mmol/L (ref 135–145)
Total Bilirubin: 0.8 mg/dL (ref 0.3–1.2)
Total Protein: 5.8 g/dL — ABNORMAL LOW (ref 6.5–8.1)

## 2018-08-27 NOTE — Progress Notes (Signed)
Hematology and Oncology Follow Up Visit  Jessica Dennis [id number] July 13, 1932 85 y.o. 08/27/2018   Principle Diagnosis:  Chronic thrombocytopenia secondary to cirrhosis and splenomegaly Iron deficiency anemia secondary to GI bleed  Current Therapy:   IV iron as indicated- last received in March 2020 Red blood cell transfusion as needed for variceal bleeding- last received in February 2019   Interim History:  Jessica Dennis is here today for follow-up. She is doing well and has no complaints at this time despite her Hgb being 7.9, MCV 87 and platelets 66.  She has not noted any obvious blood loss. Her skin is thin and she does bruise easily on her arms. No petechiae.  She denies fatigue.  No fever, chills, n/v, cough, rash, dizziness, SOB, chest pain, palpitations, abdominal pain or changes in bowel or bladder habits.  No tenderness in her extremities.  She has mild puffiness in her ankles that waxes and wanes.  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  She is eating well and staying hydrated. Her weight is stable.   ECOG Performance Status: 1 - Symptomatic but completely ambulatory  Medications:  Allergies as of 08/27/2018   No Known Allergies     Medication List       Accurate as of August 27, 2018 12:08 PM. If you have any questions, ask your nurse or doctor.        Accu-Chek FastClix Lancets Misc CHECK BLOOD SUGAR ONCE DAILY.   ammonium lactate 12 % cream Commonly known as: AMLACTIN   Calcium Carbonate-Vitamin D 600-200 MG-UNIT Caps Take by mouth.   Fish Oil 1000 MG Caps Take by mouth every morning.   Flax Seed Oil 1000 MG Caps Take by mouth.   gabapentin 100 MG capsule Commonly known as: NEURONTIN Take 100 mg by mouth 2 (two) times daily.   glucose blood test strip Accu-Chek Fastclix Lancet Drum   latanoprost 0.005 % ophthalmic solution Commonly known as: XALATAN Place 1 drop into both eyes at bedtime.   levothyroxine 75 MCG tablet Commonly known as:  SYNTHROID TAKE ONE TABLET BY MOUTH DAILY   LORazepam 0.5 MG tablet Commonly known as: ATIVAN TAKE ONE TABLET BY MOUTH DAILY AS NEEDED   Melatonin 5 MG Caps Take 5 mg by mouth.   NOVOLOG MIX 70/30 [place] Inject into the skin. 35 units in the mornings and 25 at night   pantoprazole 40 MG tablet Commonly known as: PROTONIX TAKE ONE TABLET BY MOUTH DAILY - NEEDS OFFICE VISIT   Prednicarbate 0.1 % Crea Apply topically 2 (two) times daily.   simvastatin 20 MG tablet Commonly known as: ZOCOR TAKE ONE TABLET BY MOUTH AT BEDTIME - NEEDS OFFICE VISIT   spironolactone 50 MG tablet Commonly known as: ALDACTONE TAKE TWO TABLETS BY MOUTH DAILY   SSD 1 % cream Generic drug: silver sulfADIAZINE 1 application daily.   tiZANidine 4 MG tablet Commonly known as: ZANAFLEX TAKE ONE TABLET BY MOUTH EVERY NIGHT AT BEDTIME AS NEEDED MUSCLE SPASMS   triamterene-hydrochlorothiazide 37.5-25 MG tablet Commonly known as: MAXZIDE-25 triamterene 37.5 mg-hydrochlorothiazide 25 mg tablet   vitamin C 1000 MG tablet Take 1,000 mg by mouth daily.   vitamin E 100 UNIT capsule Take by mouth daily.       Allergies: No Known Allergies  Past Medical History, Surgical history, Social history, and Family History were reviewed and updated.  Review of Systems: All other 10 point review of systems is negative.   Physical Exam:  weight is 175 lb (79.4  kg). Her oral temperature is 97.4 F (36.3 C) (abnormal). Her blood pressure is 137/56 (abnormal) and her pulse is 92. Her respiration is 20 and oxygen saturation is 99%.   Wt Readings from Last 3 Encounters:  08/27/18 175 lb (79.4 kg)  06/26/18 170 lb (77.1 kg)  05/16/18 169 lb 12.8 oz (77 kg)    Ocular: Sclerae unicteric, pupils equal, round and reactive to light Ear-nose-throat: Oropharynx clear, dentition fair Lymphatic: No cervical or supraclavicular adenopathy Lungs no rales or rhonchi, good excursion bilaterally Heart regular rate and rhythm,  no murmur appreciated Abd soft, nontender, positive bowel sounds, no liver or spleen tip palpated on exam, no fluid wave  MSK no focal spinal tenderness, no joint edema Neuro: non-focal, well-oriented, appropriate affect Breasts: Deferred   Lab Results  Component Value Date   WBC 3.3 (L) 08/27/2018   HGB 7.9 (L) 08/27/2018   HCT 26.1 (L) 08/27/2018   MCV 87.6 08/27/2018   PLT 66 (L) 08/27/2018   Lab Results  Component Value Date   FERRITIN 10 (L) 06/26/2018   IRON 44 06/26/2018   TIBC 350 06/26/2018   UIBC 306 06/26/2018   IRONPCTSAT 13 (L) 06/26/2018   Lab Results  Component Value Date   RETICCTPCT 2.3 03/18/2018   RBC 2.98 (L) 08/27/2018   No results found for: KPAFRELGTCHN, LAMBDASER, KAPLAMBRATIO No results found for: Kandis Cocking, IGMSERUM No results found for: Odetta Pink, SPEI   Chemistry      Component Value Date/Time   NA 137 08/27/2018 1137   NA 138 12/18/2016 1314   NA 139 03/05/2016 1139   K 5.2 (H) 08/27/2018 1137   K 4.3 12/18/2016 1314   K 3.6 03/05/2016 1139   CL 106 08/27/2018 1137   CL 101 12/18/2016 1314   CO2 24 08/27/2018 1137   CO2 28 12/18/2016 1314   CO2 26 03/05/2016 1139   BUN 21 08/27/2018 1137   BUN 33 (H) 12/18/2016 1314   BUN 14.3 03/05/2016 1139   CREATININE 1.06 (H) 08/27/2018 1137   CREATININE 1.06 (H) 02/14/2018 1544   CREATININE 0.7 03/05/2016 1139      Component Value Date/Time   CALCIUM 8.8 (L) 08/27/2018 1137   CALCIUM 10.0 12/18/2016 1314   CALCIUM 9.3 03/05/2016 1139   ALKPHOS 103 08/27/2018 1137   ALKPHOS 94 (H) 12/18/2016 1314   ALKPHOS 127 03/05/2016 1139   AST 32 08/27/2018 1137   AST 29 03/05/2016 1139   ALT 17 08/27/2018 1137   ALT 26 12/18/2016 1314   ALT 22 03/05/2016 1139   BILITOT 0.8 08/27/2018 1137   BILITOT 1.75 (H) 03/05/2016 1139       Impression and Plan: Jessica Dennis is a very pleasant 85 yo female with chronic thrombocytopenia and iron  deficiency anemia secondary to intermittent GI blood loss. Hgb is down at 7.9. We will give her iron tomorrow and a again next week.  We will plan to see her back in another month for follow-up.  She will contact our office with any questions or concerns. We can certainly see her sooner if needed.   [name], NP 8/5/202012:08 PM

## 2018-08-28 ENCOUNTER — Inpatient Hospital Stay: Payer: Medicare HMO

## 2018-08-28 ENCOUNTER — Other Ambulatory Visit: Payer: Self-pay | Admitting: Internal Medicine

## 2018-08-28 VITALS — BP 138/51 | HR 74

## 2018-08-28 DIAGNOSIS — K5520 Angiodysplasia of colon without hemorrhage: Secondary | ICD-10-CM

## 2018-08-28 DIAGNOSIS — D500 Iron deficiency anemia secondary to blood loss (chronic): Secondary | ICD-10-CM

## 2018-08-28 DIAGNOSIS — G629 Polyneuropathy, unspecified: Secondary | ICD-10-CM | POA: Diagnosis not present

## 2018-08-28 DIAGNOSIS — R161 Splenomegaly, not elsewhere classified: Secondary | ICD-10-CM | POA: Diagnosis not present

## 2018-08-28 DIAGNOSIS — K7460 Unspecified cirrhosis of liver: Secondary | ICD-10-CM | POA: Diagnosis not present

## 2018-08-28 DIAGNOSIS — R195 Other fecal abnormalities: Secondary | ICD-10-CM

## 2018-08-28 DIAGNOSIS — K922 Gastrointestinal hemorrhage, unspecified: Secondary | ICD-10-CM | POA: Diagnosis not present

## 2018-08-28 DIAGNOSIS — K219 Gastro-esophageal reflux disease without esophagitis: Secondary | ICD-10-CM

## 2018-08-28 DIAGNOSIS — I864 Gastric varices: Secondary | ICD-10-CM

## 2018-08-28 DIAGNOSIS — Z79899 Other long term (current) drug therapy: Secondary | ICD-10-CM | POA: Diagnosis not present

## 2018-08-28 DIAGNOSIS — D6959 Other secondary thrombocytopenia: Secondary | ICD-10-CM | POA: Diagnosis not present

## 2018-08-28 LAB — IRON AND TIBC
Iron: 25 ug/dL — ABNORMAL LOW (ref 41–142)
Saturation Ratios: 7 % — ABNORMAL LOW (ref 21–57)
TIBC: 351 ug/dL (ref 236–444)
UIBC: 326 ug/dL (ref 120–384)

## 2018-08-28 LAB — FERRITIN: Ferritin: 13 ng/mL (ref 11–307)

## 2018-08-28 MED ORDER — SODIUM CHLORIDE 0.9 % IV SOLN
510.0000 mg | Freq: Once | INTRAVENOUS | Status: AC
  Administered 2018-08-28: 510 mg via INTRAVENOUS
  Filled 2018-08-28: qty 510

## 2018-08-28 MED ORDER — SODIUM CHLORIDE 0.9 % IV SOLN
Freq: Once | INTRAVENOUS | Status: AC
  Administered 2018-08-28: 11:00:00 via INTRAVENOUS
  Filled 2018-08-28: qty 250

## 2018-08-28 NOTE — Patient Instructions (Signed)

## 2018-08-28 NOTE — Telephone Encounter (Signed)
Please advise Sir? Thank you. 

## 2018-08-29 NOTE — Telephone Encounter (Signed)
OK  Looks like [facility] refilled it

## 2018-08-30 ENCOUNTER — Other Ambulatory Visit: Payer: Self-pay | Admitting: Family Medicine

## 2018-09-03 ENCOUNTER — Other Ambulatory Visit: Payer: Self-pay

## 2018-09-03 ENCOUNTER — Inpatient Hospital Stay: Payer: Medicare HMO

## 2018-09-03 VITALS — BP 112/50 | HR 90 | Resp 18

## 2018-09-03 DIAGNOSIS — G629 Polyneuropathy, unspecified: Secondary | ICD-10-CM | POA: Diagnosis not present

## 2018-09-03 DIAGNOSIS — D500 Iron deficiency anemia secondary to blood loss (chronic): Secondary | ICD-10-CM | POA: Diagnosis not present

## 2018-09-03 DIAGNOSIS — K5520 Angiodysplasia of colon without hemorrhage: Secondary | ICD-10-CM

## 2018-09-03 DIAGNOSIS — K219 Gastro-esophageal reflux disease without esophagitis: Secondary | ICD-10-CM

## 2018-09-03 DIAGNOSIS — K922 Gastrointestinal hemorrhage, unspecified: Secondary | ICD-10-CM | POA: Diagnosis not present

## 2018-09-03 DIAGNOSIS — K7460 Unspecified cirrhosis of liver: Secondary | ICD-10-CM | POA: Diagnosis not present

## 2018-09-03 DIAGNOSIS — Z79899 Other long term (current) drug therapy: Secondary | ICD-10-CM | POA: Diagnosis not present

## 2018-09-03 DIAGNOSIS — D6959 Other secondary thrombocytopenia: Secondary | ICD-10-CM | POA: Diagnosis not present

## 2018-09-03 DIAGNOSIS — R195 Other fecal abnormalities: Secondary | ICD-10-CM

## 2018-09-03 DIAGNOSIS — R161 Splenomegaly, not elsewhere classified: Secondary | ICD-10-CM | POA: Diagnosis not present

## 2018-09-03 MED ORDER — SODIUM CHLORIDE 0.9 % IV SOLN
INTRAVENOUS | Status: DC
  Administered 2018-09-03: 14:00:00 via INTRAVENOUS
  Filled 2018-09-03: qty 250

## 2018-09-03 MED ORDER — SODIUM CHLORIDE 0.9 % IV SOLN
510.0000 mg | Freq: Once | INTRAVENOUS | Status: AC
  Administered 2018-09-03: 510 mg via INTRAVENOUS
  Filled 2018-09-03: qty 510

## 2018-09-11 ENCOUNTER — Other Ambulatory Visit: Payer: Self-pay | Admitting: Family Medicine

## 2018-09-11 DIAGNOSIS — F411 Generalized anxiety disorder: Secondary | ICD-10-CM

## 2018-09-12 ENCOUNTER — Encounter: Payer: Self-pay | Admitting: Internal Medicine

## 2018-09-12 NOTE — Telephone Encounter (Signed)
Requesting: Ativan Contract: 04/26/2017 UDS: 04/26/2017, low risk, next screen 04/27/2018 Last OV: 02/14/2018 Next OV: N/A Last Refill: 06/19/2018, #90--0 RF Database:   Please advise

## 2018-09-16 DIAGNOSIS — I10 Essential (primary) hypertension: Secondary | ICD-10-CM | POA: Diagnosis not present

## 2018-09-16 DIAGNOSIS — E1165 Type 2 diabetes mellitus with hyperglycemia: Secondary | ICD-10-CM | POA: Diagnosis not present

## 2018-09-16 DIAGNOSIS — E7800 Pure hypercholesterolemia, unspecified: Secondary | ICD-10-CM | POA: Diagnosis not present

## 2018-09-16 DIAGNOSIS — E039 Hypothyroidism, unspecified: Secondary | ICD-10-CM | POA: Diagnosis not present

## 2018-10-08 ENCOUNTER — Encounter: Payer: Self-pay | Admitting: Family

## 2018-10-08 ENCOUNTER — Other Ambulatory Visit: Payer: Self-pay

## 2018-10-08 ENCOUNTER — Inpatient Hospital Stay (HOSPITAL_BASED_OUTPATIENT_CLINIC_OR_DEPARTMENT_OTHER): Payer: Medicare HMO | Admitting: Family

## 2018-10-08 ENCOUNTER — Telehealth: Payer: Self-pay | Admitting: Hematology & Oncology

## 2018-10-08 ENCOUNTER — Inpatient Hospital Stay: Payer: Medicare HMO | Attending: Hematology & Oncology

## 2018-10-08 VITALS — BP 114/48 | HR 72 | Temp 97.1°F | Resp 18 | Ht 65.0 in | Wt 168.0 lb

## 2018-10-08 DIAGNOSIS — G629 Polyneuropathy, unspecified: Secondary | ICD-10-CM | POA: Insufficient documentation

## 2018-10-08 DIAGNOSIS — D500 Iron deficiency anemia secondary to blood loss (chronic): Secondary | ICD-10-CM

## 2018-10-08 DIAGNOSIS — D6959 Other secondary thrombocytopenia: Secondary | ICD-10-CM | POA: Diagnosis not present

## 2018-10-08 DIAGNOSIS — K7460 Unspecified cirrhosis of liver: Secondary | ICD-10-CM | POA: Insufficient documentation

## 2018-10-08 DIAGNOSIS — Z79899 Other long term (current) drug therapy: Secondary | ICD-10-CM | POA: Diagnosis not present

## 2018-10-08 DIAGNOSIS — R5383 Other fatigue: Secondary | ICD-10-CM | POA: Diagnosis not present

## 2018-10-08 DIAGNOSIS — K922 Gastrointestinal hemorrhage, unspecified: Secondary | ICD-10-CM | POA: Diagnosis not present

## 2018-10-08 DIAGNOSIS — D649 Anemia, unspecified: Secondary | ICD-10-CM

## 2018-10-08 LAB — CBC WITH DIFFERENTIAL (CANCER CENTER ONLY)
Abs Immature Granulocytes: 0.01 10*3/uL (ref 0.00–0.07)
Basophils Absolute: 0 10*3/uL (ref 0.0–0.1)
Basophils Relative: 1 %
Eosinophils Absolute: 0.1 10*3/uL (ref 0.0–0.5)
Eosinophils Relative: 3 %
HCT: 32.4 % — ABNORMAL LOW (ref 36.0–46.0)
Hemoglobin: 10.3 g/dL — ABNORMAL LOW (ref 12.0–15.0)
Immature Granulocytes: 0 %
Lymphocytes Relative: 15 %
Lymphs Abs: 0.6 10*3/uL — ABNORMAL LOW (ref 0.7–4.0)
MCH: 29.9 pg (ref 26.0–34.0)
MCHC: 31.8 g/dL (ref 30.0–36.0)
MCV: 93.9 fL (ref 80.0–100.0)
Monocytes Absolute: 0.3 10*3/uL (ref 0.1–1.0)
Monocytes Relative: 8 %
Neutro Abs: 2.8 10*3/uL (ref 1.7–7.7)
Neutrophils Relative %: 73 %
Platelet Count: 67 10*3/uL — ABNORMAL LOW (ref 150–400)
RBC: 3.45 MIL/uL — ABNORMAL LOW (ref 3.87–5.11)
RDW: 17.3 % — ABNORMAL HIGH (ref 11.5–15.5)
WBC Count: 3.8 10*3/uL — ABNORMAL LOW (ref 4.0–10.5)
nRBC: 0 % (ref 0.0–0.2)

## 2018-10-08 LAB — CMP (CANCER CENTER ONLY)
ALT: 16 U/L (ref 0–44)
AST: 27 U/L (ref 15–41)
Albumin: 3.8 g/dL (ref 3.5–5.0)
Alkaline Phosphatase: 86 U/L (ref 38–126)
Anion gap: 5 (ref 5–15)
BUN: 26 mg/dL — ABNORMAL HIGH (ref 8–23)
CO2: 24 mmol/L (ref 22–32)
Calcium: 9.2 mg/dL (ref 8.9–10.3)
Chloride: 103 mmol/L (ref 98–111)
Creatinine: 1.19 mg/dL — ABNORMAL HIGH (ref 0.44–1.00)
GFR, Est AFR Am: 49 mL/min — ABNORMAL LOW (ref >60)
GFR, Estimated: 42 mL/min — ABNORMAL LOW (ref >60)
Glucose, Bld: 283 mg/dL — ABNORMAL HIGH (ref 70–99)
Potassium: 4.9 mmol/L (ref 3.5–5.1)
Sodium: 132 mmol/L — ABNORMAL LOW (ref 135–145)
Total Bilirubin: 1.2 mg/dL (ref 0.3–1.2)
Total Protein: 5.9 g/dL — ABNORMAL LOW (ref 6.5–8.1)

## 2018-10-08 NOTE — Progress Notes (Signed)
Hematology and Oncology Follow Up Visit  Jessica Dennis [id number] 28-Mar-1932 85 y.o. 10/08/2018   Principle Diagnosis:  Chronic thrombocytopenia secondary to cirrhosis and splenomegaly Iron deficiency anemia secondary to GI bleed  Current Therapy:   IV iron as indicated- last received inMarch 2020 Red blood cell transfusion as needed for variceal bleeding- last received in February 2019   Interim History:  Jessica Dennis is here today for follow-up. Jessica Dennis is doing well but has some mild fatigue at times.  Jessica Dennis has not noted any bleeding. Jessica Dennis does bruise easily.  Hgb is stable at 10.3, MCV 93. Platelets are 67.  No fever, chills, n/v, cough, rash, dizziness, SOB, chest pain, palpitations, abdominal pain or changes in bowel or bladder habits.  No swelling or tenderness in her extremities.  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  No falls or syncope. Jessica Dennis tripped the other day when her dog got in a fight with a chihuahua. Thankfully Jessica Dennis did not get hurt.    ECOG Performance Status: 1 - Symptomatic but completely ambulatory  Medications:  Allergies as of 10/08/2018   No Known Allergies     Medication List       Accurate as of October 08, 2018 11:48 AM. If you have any questions, ask your nurse or doctor.        Accu-Chek FastClix Lancets Misc CHECK BLOOD SUGAR ONCE DAILY.   ammonium lactate 12 % cream Commonly known as: AMLACTIN   Calcium Carbonate-Vitamin D 600-200 MG-UNIT Caps Take by mouth.   Fish Oil 1000 MG Caps Take by mouth every morning.   Flax Seed Oil 1000 MG Caps Take by mouth.   gabapentin 100 MG capsule Commonly known as: NEURONTIN Take 100 mg by mouth 2 (two) times daily.   glucose blood test strip Accu-Chek Fastclix Lancet Drum   latanoprost 0.005 % ophthalmic solution Commonly known as: XALATAN Place 1 drop into both eyes at bedtime.   levothyroxine 75 MCG tablet Commonly known as: SYNTHROID TAKE ONE TABLET BY MOUTH DAILY    LORazepam 0.5 MG tablet Commonly known as: ATIVAN TAKE ONE TABLET BY MOUTH DAILY AS NEEDED   Melatonin 5 MG Caps Take 5 mg by mouth.   NOVOLOG MIX 70/30 [place] Inject into the skin. 35 units in the mornings and 25 at night   pantoprazole 40 MG tablet Commonly known as: PROTONIX TAKE ONE TABLET BY MOUTH DAILY - NEEDS OFFICE VISIT   Prednicarbate 0.1 % Crea Apply topically 2 (two) times daily.   simvastatin 20 MG tablet Commonly known as: ZOCOR TAKE ONE TABLET BY MOUTH AT BEDTIME - NEEDS OFFICE VISIT   spironolactone 50 MG tablet Commonly known as: ALDACTONE TAKE TWO TABLETS BY MOUTH DAILY   SSD 1 % cream Generic drug: silver sulfADIAZINE 1 application daily.   tiZANidine 4 MG tablet Commonly known as: ZANAFLEX TAKE ONE TABLET BY MOUTH EVERY NIGHT AT BEDTIME AS NEEDED MUSCLE SPASMS   triamterene-hydrochlorothiazide 37.5-25 MG tablet Commonly known as: MAXZIDE-25 triamterene 37.5 mg-hydrochlorothiazide 25 mg tablet   vitamin C 1000 MG tablet Take 1,000 mg by mouth daily.   vitamin E 100 UNIT capsule Take by mouth daily.       Allergies: No Known Allergies  Past Medical History, Surgical history, Social history, and Family History were reviewed and updated.  Review of Systems: All other 10 point review of systems is negative.   Physical Exam:  height is 5\' 5"  (1.651 m) and weight is 168 lb (76.2 kg). Her temporal  temperature is 97.1 F (36.2 C) (abnormal). Her blood pressure is 114/48 (abnormal) and her pulse is 72. Her respiration is 18 and oxygen saturation is 100%.   Wt Readings from Last 3 Encounters:  10/08/18 168 lb (76.2 kg)  08/27/18 175 lb (79.4 kg)  06/26/18 170 lb (77.1 kg)    Ocular: Sclerae unicteric, pupils equal, round and reactive to light Ear-nose-throat: Oropharynx clear, dentition fair Lymphatic: No cervical or supraclavicular adenopathy Lungs no rales or rhonchi, good excursion bilaterally Heart regular rate and rhythm, no murmur  appreciated Abd soft, nontender, positive bowel sounds, no liver or spleen tip palpated on exam, no fluid wave  MSK no focal spinal tenderness, no joint edema Neuro: non-focal, well-oriented, appropriate affect Breasts: Deferred   Lab Results  Component Value Date   WBC 3.8 (L) 10/08/2018   HGB 10.3 (L) 10/08/2018   HCT 32.4 (L) 10/08/2018   MCV 93.9 10/08/2018   PLT 67 (L) 10/08/2018   Lab Results  Component Value Date   FERRITIN 13 08/27/2018   IRON 25 (L) 08/27/2018   TIBC 351 08/27/2018   UIBC 326 08/27/2018   IRONPCTSAT 7 (L) 08/27/2018   Lab Results  Component Value Date   RETICCTPCT 2.3 03/18/2018   RBC 3.45 (L) 10/08/2018   No results found for: KPAFRELGTCHN, LAMBDASER, KAPLAMBRATIO No results found for: Kandis Cocking, IGMSERUM No results found for: Odetta Pink, SPEI   Chemistry      Component Value Date/Time   NA 137 08/27/2018 1137   NA 138 12/18/2016 1314   NA 139 03/05/2016 1139   K 5.2 (H) 08/27/2018 1137   K 4.3 12/18/2016 1314   K 3.6 03/05/2016 1139   CL 106 08/27/2018 1137   CL 101 12/18/2016 1314   CO2 24 08/27/2018 1137   CO2 28 12/18/2016 1314   CO2 26 03/05/2016 1139   BUN 21 08/27/2018 1137   BUN 33 (H) 12/18/2016 1314   BUN 14.3 03/05/2016 1139   CREATININE 1.06 (H) 08/27/2018 1137   CREATININE 1.06 (H) 02/14/2018 1544   CREATININE 0.7 03/05/2016 1139      Component Value Date/Time   CALCIUM 8.8 (L) 08/27/2018 1137   CALCIUM 10.0 12/18/2016 1314   CALCIUM 9.3 03/05/2016 1139   ALKPHOS 103 08/27/2018 1137   ALKPHOS 94 (H) 12/18/2016 1314   ALKPHOS 127 03/05/2016 1139   AST 32 08/27/2018 1137   AST 29 03/05/2016 1139   ALT 17 08/27/2018 1137   ALT 26 12/18/2016 1314   ALT 22 03/05/2016 1139   BILITOT 0.8 08/27/2018 1137   BILITOT 1.75 (H) 03/05/2016 1139       Impression and Plan: Jessica Dennis is a very pleasant 85 yo female with chronic thrombocytopenia and iron deficiency  anemia secondary to intermittent GI blood loss. We will see what her iron studies show and bring her back in for infusion if needed.  We will do a lab only in 1 month and follow-up in 2 months.  Jessica Dennis will contact our office with any questions or concerns. We can certainly see her sooner if needed.   [name], NP 9/16/202011:48 AM

## 2018-10-08 NOTE — Telephone Encounter (Signed)
Called patient however was unable to [facility] on either home or cell phone.  Letter/Calendar mailed per 9/16 los  I did call son Tom's number and was able to [facility] for him w/ date/time of appointments

## 2018-10-09 LAB — IRON AND TIBC
Iron: 42 ug/dL (ref 41–142)
Saturation Ratios: 15 % — ABNORMAL LOW (ref 21–57)
TIBC: 285 ug/dL (ref 236–444)
UIBC: 243 ug/dL (ref 120–384)

## 2018-10-09 LAB — FERRITIN: Ferritin: 32 ng/mL (ref 11–307)

## 2018-10-10 ENCOUNTER — Telehealth: Payer: Self-pay | Admitting: Hematology & Oncology

## 2018-10-10 NOTE — Telephone Encounter (Signed)
LMOM TO INFORM PATIENT OF IRONAPPT 9/23 AT 1 PM PER 9/18 [place] [facility]

## 2018-10-14 ENCOUNTER — Other Ambulatory Visit: Payer: Self-pay | Admitting: Family

## 2018-10-15 ENCOUNTER — Inpatient Hospital Stay: Payer: Medicare HMO

## 2018-10-15 ENCOUNTER — Other Ambulatory Visit: Payer: Self-pay

## 2018-10-15 VITALS — BP 160/61 | HR 84 | Temp 97.5°F | Resp 18

## 2018-10-15 DIAGNOSIS — K5520 Angiodysplasia of colon without hemorrhage: Secondary | ICD-10-CM

## 2018-10-15 DIAGNOSIS — D6959 Other secondary thrombocytopenia: Secondary | ICD-10-CM | POA: Diagnosis not present

## 2018-10-15 DIAGNOSIS — G629 Polyneuropathy, unspecified: Secondary | ICD-10-CM | POA: Diagnosis not present

## 2018-10-15 DIAGNOSIS — Z79899 Other long term (current) drug therapy: Secondary | ICD-10-CM | POA: Diagnosis not present

## 2018-10-15 DIAGNOSIS — R5383 Other fatigue: Secondary | ICD-10-CM | POA: Diagnosis not present

## 2018-10-15 DIAGNOSIS — K7460 Unspecified cirrhosis of liver: Secondary | ICD-10-CM | POA: Diagnosis not present

## 2018-10-15 DIAGNOSIS — R195 Other fecal abnormalities: Secondary | ICD-10-CM

## 2018-10-15 DIAGNOSIS — D500 Iron deficiency anemia secondary to blood loss (chronic): Secondary | ICD-10-CM

## 2018-10-15 DIAGNOSIS — K922 Gastrointestinal hemorrhage, unspecified: Secondary | ICD-10-CM | POA: Diagnosis not present

## 2018-10-15 DIAGNOSIS — K219 Gastro-esophageal reflux disease without esophagitis: Secondary | ICD-10-CM

## 2018-10-15 MED ORDER — SODIUM CHLORIDE 0.9 % IV SOLN
Freq: Once | INTRAVENOUS | Status: AC
  Administered 2018-10-15: 13:00:00 via INTRAVENOUS
  Filled 2018-10-15: qty 250

## 2018-10-15 MED ORDER — SODIUM CHLORIDE 0.9 % IV SOLN
200.0000 mg | Freq: Once | INTRAVENOUS | Status: AC
  Administered 2018-10-15: 200 mg via INTRAVENOUS
  Filled 2018-10-15: qty 10

## 2018-10-15 NOTE — Patient Instructions (Signed)

## 2018-10-27 ENCOUNTER — Other Ambulatory Visit: Payer: Self-pay

## 2018-10-27 ENCOUNTER — Encounter: Payer: Self-pay | Admitting: Family Medicine

## 2018-10-27 ENCOUNTER — Ambulatory Visit (INDEPENDENT_AMBULATORY_CARE_PROVIDER_SITE_OTHER): Payer: Medicare HMO | Admitting: Family Medicine

## 2018-10-27 VITALS — BP 142/72 | HR 82 | Temp 97.5°F | Resp 18 | Ht 65.0 in | Wt 168.8 lb

## 2018-10-27 DIAGNOSIS — E039 Hypothyroidism, unspecified: Secondary | ICD-10-CM

## 2018-10-27 DIAGNOSIS — E785 Hyperlipidemia, unspecified: Secondary | ICD-10-CM

## 2018-10-27 DIAGNOSIS — S81802A Unspecified open wound, left lower leg, initial encounter: Secondary | ICD-10-CM

## 2018-10-27 DIAGNOSIS — Z23 Encounter for immunization: Secondary | ICD-10-CM | POA: Diagnosis not present

## 2018-10-27 DIAGNOSIS — K219 Gastro-esophageal reflux disease without esophagitis: Secondary | ICD-10-CM

## 2018-10-27 DIAGNOSIS — R251 Tremor, unspecified: Secondary | ICD-10-CM | POA: Diagnosis not present

## 2018-10-27 DIAGNOSIS — I10 Essential (primary) hypertension: Secondary | ICD-10-CM

## 2018-10-27 DIAGNOSIS — Z794 Long term (current) use of insulin: Secondary | ICD-10-CM | POA: Diagnosis not present

## 2018-10-27 DIAGNOSIS — E1165 Type 2 diabetes mellitus with hyperglycemia: Secondary | ICD-10-CM | POA: Diagnosis not present

## 2018-10-27 DIAGNOSIS — F411 Generalized anxiety disorder: Secondary | ICD-10-CM

## 2018-10-27 DIAGNOSIS — E1169 Type 2 diabetes mellitus with other specified complication: Secondary | ICD-10-CM

## 2018-10-27 DIAGNOSIS — R69 Illness, unspecified: Secondary | ICD-10-CM | POA: Diagnosis not present

## 2018-10-27 MED ORDER — PANTOPRAZOLE SODIUM 40 MG PO TBEC: DELAYED_RELEASE_TABLET | ORAL | 1 refills | Status: DC

## 2018-10-27 MED ORDER — LEVOTHYROXINE SODIUM 75 MCG PO TABS: 75.0000 ug | ORAL_TABLET | Freq: Every day | ORAL | 1 refills | Status: DC

## 2018-10-27 MED ORDER — LORAZEPAM 0.5 MG PO TABS: 0.5000 mg | ORAL_TABLET | Freq: Every day | ORAL | 1 refills | Status: DC | PRN

## 2018-10-27 MED ORDER — CEPHALEXIN 500 MG PO CAPS: 500.0000 mg | ORAL_CAPSULE | Freq: Two times a day (BID) | ORAL | 0 refills | Status: DC

## 2018-10-27 MED ORDER — SIMVASTATIN 20 MG PO TABS: ORAL_TABLET | ORAL | 1 refills | Status: DC

## 2018-10-27 NOTE — Assessment & Plan Note (Signed)
abx ointment and telfa with cobain used to wrap F/u 1 week or sooner prn  Tetanus utd

## 2018-10-27 NOTE — Assessment & Plan Note (Signed)
Stable con't meds 

## 2018-10-27 NOTE — Assessment & Plan Note (Signed)
Tolerating statin, encouraged heart healthy diet, avoid trans fats, minimize simple carbs and saturated fats. Increase exercise as tolerated 

## 2018-10-27 NOTE — Assessment & Plan Note (Signed)
Intention tremor? Pt requesting to see neuro

## 2018-10-27 NOTE — Assessment & Plan Note (Signed)
Check labs stable 

## 2018-10-27 NOTE — Progress Notes (Signed)
Patient ID: Jessica Dennis, female    DOB: Apr 05, 1932  Age: 85 y.o. MRN: [id number]    Subjective:  Subjective  HPI Jessica Dennis presents for f/u bp, chol and anxiety.   He also c/o wound on back of L calf 3 days ago.  It occurred while getting out of her car.  She has been putting bandaids on it.    Review of Systems  Constitutional: Negative for activity change, appetite change, fatigue and unexpected weight change.  Respiratory: Negative for cough and shortness of breath.   Cardiovascular: Negative for chest pain and palpitations.  Psychiatric/Behavioral: Negative for behavioral problems and dysphoric mood. The patient is not nervous/anxious.     History Past Medical History:  Diagnosis Date  . Anemia   . Angiodysplasia of ascending colon 10/25/2014  . Anxiety   . Arthritis   . Borderline diabetes   . Cancer of the skin, basal cell 09/03/2012  . Cirrhosis ([place])   . Diabetes mellitus without complication ([place])   . Diverticular disease   . 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 GERD (gastroesophageal reflux disease)   . Hypertension   . Iron deficiency anemia due to chronic blood loss 01/19/2015  . Portal hypertensive gastropathy ([place]) 08/02/2016   ? Some GAVE also  . Thyroid disease     She has a past surgical history that includes Abdominal hysterectomy; Total hip arthroplasty (Bilateral, 1993, 2006); Appendectomy; Tonsillectomy; Colonoscopy; Leg skin lesion  biopsy / excision (12/11/14); Mohs surgery; Esophagogastroduodenoscopy; IR Paracentesis (05/25/2016); and Upper gastrointestinal endoscopy.   Her family history includes Bladder Cancer in her father; Diabetes in her mother; Heart attack in her father.She reports that she has never smoked. She has never used smokeless tobacco. She reports that she does not drink alcohol or use drugs.  Current Outpatient Medications on File Prior to Visit  Medication Sig Dispense Refill  . ACCU-CHEK FASTCLIX LANCETS MISC  CHECK BLOOD SUGAR ONCE DAILY. 102 each 6  . ammonium lactate (AMLACTIN) 12 % cream     . Ascorbic Acid (VITAMIN C) 1000 MG tablet Take 1,000 mg by mouth daily.    . Calcium Carbonate-Vitamin D 600-200 MG-UNIT CAPS Take by mouth.    . Flaxseed, Linseed, (FLAX SEED OIL) 1000 MG CAPS Take by mouth.    . gabapentin (NEURONTIN) 100 MG capsule Take 100 mg by mouth 2 (two) times daily.     Marland Kitchen glucose blood test strip Accu-Chek [employer]    . Insulin Aspart Prot & Aspart (NOVOLOG MIX 70/30 [place]) Inject into the skin. 35 units in the mornings and 25 at night    . latanoprost (XALATAN) 0.005 % ophthalmic solution Place 1 drop into both eyes at bedtime.     . Melatonin 5 MG CAPS Take 5 mg by mouth.    . Omega-3 Fatty Acids (FISH OIL) 1000 MG CAPS Take by mouth every morning.    . Prednicarbate 0.1 % CREA Apply topically 2 (two) times daily.     Marland Kitchen spironolactone (ALDACTONE) 50 MG tablet TAKE TWO TABLETS BY MOUTH DAILY 180 tablet 1  . SSD 1 % cream 1 application daily.     Marland Kitchen tiZANidine (ZANAFLEX) 4 MG tablet TAKE ONE TABLET BY MOUTH EVERY NIGHT AT BEDTIME AS NEEDED MUSCLE SPASMS 30 tablet 2  . triamterene-hydrochlorothiazide (MAXZIDE-25) 37.5-25 MG tablet triamterene 37.5 mg-hydrochlorothiazide 25 mg tablet    . vitamin E 100 UNIT capsule Take by mouth daily.     No current facility-administered medications on  file prior to visit.      Objective:  Objective  Physical Exam Vitals signs and nursing note reviewed.  Constitutional:      Appearance: She is well-developed.  HENT:     Head: Normocephalic and atraumatic.  Eyes:     Conjunctiva/sclera: Conjunctivae normal.  Neck:     Musculoskeletal: Normal range of motion and neck supple.     Thyroid: No thyromegaly.     Vascular: No carotid bruit or JVD.  Cardiovascular:     Rate and Rhythm: Normal rate and regular rhythm.     Heart sounds: Normal heart sounds. No murmur.  Pulmonary:     Effort: Pulmonary effort is normal. No respiratory  distress.     Breath sounds: Normal breath sounds. No wheezing or rales.  Chest:     Chest wall: No tenderness.  Skin:    Findings: Erythema and lesion present.       Neurological:     Mental Status: She is alert and oriented to person, place, and time.     Comments: Intention tremor---  R hand     BP (!) 142/72 (BP Location: Left Arm, Patient Position: Sitting, Cuff Size: Normal)   Pulse 82   Temp (!) 97.5 F (36.4 C) (Temporal)   Resp 18   Ht 5\' 5"  (1.651 m)   Wt 168 lb 12.8 oz (76.6 kg)   SpO2 98%   BMI 28.09 kg/m  Wt Readings from Last 3 Encounters:  10/27/18 168 lb 12.8 oz (76.6 kg)  10/08/18 168 lb (76.2 kg)  08/27/18 175 lb (79.4 kg)     Lab Results  Component Value Date   WBC 3.8 (L) 10/08/2018   HGB 10.3 (L) 10/08/2018   HCT 32.4 (L) 10/08/2018   PLT 67 (L) 10/08/2018   GLUCOSE 283 (H) 10/08/2018   CHOL 132 02/14/2018   TRIG 169 (H) 02/14/2018   HDL 50 (L) 02/14/2018   LDLCALC 57 02/14/2018   ALT 16 10/08/2018   AST 27 10/08/2018   NA 132 (L) 10/08/2018   K 4.9 10/08/2018   CL 103 10/08/2018   CREATININE 1.19 (H) 10/08/2018   BUN 26 (H) 10/08/2018   CO2 24 10/08/2018   TSH 1.71 02/14/2018   INR 1.2 (H) 02/05/2018   HGBA1C 6.3 (H) 03/18/2018   MICROALBUR <0.7 10/26/2016    US Abdomen Limited Ruq  Result Date: 11/28/2017 CLINICAL DATA:  Alcoholic liver cirrhosis with ascites. EXAM: ULTRASOUND ABDOMEN LIMITED RIGHT UPPER QUADRANT COMPARISON:  02/25/2017. FINDINGS: Gallbladder: No gallstones or wall thickening visualized. No sonographic Murphy sign noted by sonographer. Common bile duct: Diameter: 1.1 mm Liver: The liver remains somewhat small with irregular contours. Again demonstrated is a recanalized umbilical vein. No liver masses are seen. No ascites visualized. Portal vein is patent on color Doppler imaging with normal direction of blood flow towards the liver. IMPRESSION: 1. No liver mass seen. 2. Stable changes of cirrhosis of the liver with a  recanalized umbilical vein compatible with portal venous hypertension. Electronically Signed   By: Claudie Revering M.D.   On: 11/28/2017 09:20     Assessment & Plan:  Plan  I have changed Octavio Graves "Trudi"'s levothyroxine and LORazepam. I am also having her start on cephALEXin. Additionally, I am having her maintain her Prednicarbate, latanoprost, Fish Oil, vitamin C, Calcium Carbonate-Vitamin D, Melatonin, Flax Seed Oil, vitamin E, glucose blood, Insulin Aspart Prot & Aspart (NOVOLOG MIX 70/30 ), Accu-Chek FastClix Lancets, SSD, gabapentin, ammonium lactate, tiZANidine, triamterene-hydrochlorothiazide,  spironolactone, simvastatin, and pantoprazole.  Meds ordered this encounter  Medications  . simvastatin (ZOCOR) 20 MG tablet    Sig: TAKE ONE TABLET BY MOUTH AT BEDTIME - NEEDS OFFICE VISIT    Dispense:  90 tablet    Refill:  1  . pantoprazole (PROTONIX) 40 MG tablet    Sig: TAKE ONE TABLET BY MOUTH DAILY - NEEDS OFFICE VISIT    Dispense:  90 tablet    Refill:  1  . levothyroxine (SYNTHROID) 75 MCG tablet    Sig: Take 1 tablet (75 mcg total) by mouth daily.    Dispense:  90 tablet    Refill:  1  . LORazepam (ATIVAN) 0.5 MG tablet    Sig: Take 1 tablet (0.5 mg total) by mouth daily as needed.    Dispense:  90 tablet    Refill:  1  . cephALEXin (KEFLEX) 500 MG capsule    Sig: Take 1 capsule (500 mg total) by mouth 2 (two) times daily.    Dispense:  20 capsule    Refill:  0    Problem List Items Addressed This Visit      Unprioritized   Essential hypertension    Well controlled, no changes to meds. Encouraged heart healthy diet such as the DASH diet and exercise as tolerated.       Relevant Medications   simvastatin (ZOCOR) 20 MG tablet   Other Relevant Orders   Comprehensive metabolic panel   Generalized anxiety disorder    Stable con't meds      Relevant Medications   LORazepam (ATIVAN) 0.5 MG tablet   Hyperlipidemia associated with type 2 diabetes mellitus ([place])     Tolerating statin, encouraged heart healthy diet, avoid trans fats, minimize simple carbs and saturated fats. Increase exercise as tolerated      Relevant Medications   simvastatin (ZOCOR) 20 MG tablet   Other Relevant Orders   Lipid panel   Comprehensive metabolic panel   Hypothyroidism    Check labs  stable      Relevant Medications   levothyroxine (SYNTHROID) 75 MCG tablet   Other Relevant Orders   TSH   Comprehensive metabolic panel   Open wound, lower leg, left, initial encounter    abx ointment and telfa with cobain used to wrap F/u 1 week or sooner prn  Tetanus utd      Relevant Medications   cephALEXin (KEFLEX) 500 MG capsule   Tremor of right hand - Primary    Intention tremor? Pt requesting to see neuro      Relevant Orders   Ambulatory referral to Neurology   Type 2 diabetes mellitus with hyperglycemia, with long-term current use of insulin (HCC)   Relevant Medications   simvastatin (ZOCOR) 20 MG tablet   Other Relevant Orders   Microalbumin / creatinine urine ratio   Ambulatory referral to Ophthalmology    Other Visit Diagnoses    Gastroesophageal reflux disease       Relevant Medications   pantoprazole (PROTONIX) 40 MG tablet   Need for influenza vaccination       Relevant Orders   Flu Vaccine QUAD High Dose(Fluad) (Completed)      Follow-up: Return in about 1 week (around 11/03/2018), or if symptoms worsen or fail to improve, for recheck leg.  Ann Held, DO

## 2018-10-27 NOTE — Patient Instructions (Signed)

## 2018-10-27 NOTE — Assessment & Plan Note (Signed)
Well controlled, no changes to meds. Encouraged heart healthy diet such as the DASH diet and exercise as tolerated.  °

## 2018-10-28 LAB — COMPREHENSIVE METABOLIC PANEL
ALT: 24 U/L (ref 0–35)
AST: 37 U/L (ref 0–37)
Albumin: 4.1 g/dL (ref 3.5–5.2)
Alkaline Phosphatase: 119 U/L — ABNORMAL HIGH (ref 39–117)
BUN: 22 mg/dL (ref 6–23)
CO2: 24 mEq/L (ref 19–32)
Calcium: 9.8 mg/dL (ref 8.4–10.5)
Chloride: 105 mEq/L (ref 96–112)
Creatinine, Ser: 1.11 mg/dL (ref 0.40–1.20)
GFR: 46.85 mL/min — ABNORMAL LOW (ref >60.00)
Glucose, Bld: 254 mg/dL — ABNORMAL HIGH (ref 70–99)
Potassium: 4.4 mEq/L (ref 3.5–5.1)
Sodium: 137 mEq/L (ref 135–145)
Total Bilirubin: 1.8 mg/dL — ABNORMAL HIGH (ref 0.2–1.2)
Total Protein: 6.3 g/dL (ref 6.0–8.3)

## 2018-10-28 LAB — LIPID PANEL
Cholesterol: 109 mg/dL (ref 0–200)
HDL: 47.5 mg/dL (ref >39.00)
LDL Cholesterol: 47 mg/dL (ref 0–99)
NonHDL: 61.49
Total CHOL/HDL Ratio: 2
Triglycerides: 73 mg/dL (ref 0.0–149.0)
VLDL: 14.6 mg/dL (ref 0.0–40.0)

## 2018-10-28 LAB — MICROALBUMIN / CREATININE URINE RATIO
Creatinine,U: 83.8 mg/dL
Microalb Creat Ratio: 1.2 mg/g (ref 0.0–30.0)
Microalb, Ur: 1 mg/dL (ref 0.0–1.9)

## 2018-10-28 LAB — TSH: TSH: 1.78 u[IU]/mL (ref 0.35–4.50)

## 2018-10-31 ENCOUNTER — Encounter: Payer: Self-pay | Admitting: Neurology

## 2018-11-03 ENCOUNTER — Encounter: Payer: Self-pay | Admitting: Family Medicine

## 2018-11-03 ENCOUNTER — Other Ambulatory Visit: Payer: Self-pay

## 2018-11-03 ENCOUNTER — Ambulatory Visit (INDEPENDENT_AMBULATORY_CARE_PROVIDER_SITE_OTHER): Payer: Medicare HMO | Admitting: Family Medicine

## 2018-11-03 DIAGNOSIS — R252 Cramp and spasm: Secondary | ICD-10-CM | POA: Diagnosis not present

## 2018-11-03 DIAGNOSIS — E785 Hyperlipidemia, unspecified: Secondary | ICD-10-CM

## 2018-11-03 DIAGNOSIS — K219 Gastro-esophageal reflux disease without esophagitis: Secondary | ICD-10-CM

## 2018-11-03 DIAGNOSIS — E1169 Type 2 diabetes mellitus with other specified complication: Secondary | ICD-10-CM

## 2018-11-03 DIAGNOSIS — S81802A Unspecified open wound, left lower leg, initial encounter: Secondary | ICD-10-CM

## 2018-11-03 MED ORDER — TIZANIDINE HCL 4 MG PO TABS: ORAL_TABLET | ORAL | 2 refills | Status: DC

## 2018-11-03 MED ORDER — GABAPENTIN 100 MG PO CAPS: 100.0000 mg | ORAL_CAPSULE | Freq: Two times a day (BID) | ORAL | 1 refills | Status: DC

## 2018-11-03 NOTE — Patient Instructions (Signed)

## 2018-11-03 NOTE — Assessment & Plan Note (Addendum)
Healing well  Finish abx  rto prn

## 2018-11-03 NOTE — Progress Notes (Signed)
Patient ID: Jessica Dennis, female    DOB: 11-Dec-1932  Age: 85 y.o. MRN: [id number]    Subjective:  Subjective  HPI Jessica Dennis presents for recheck wound L low leg.   Jessica Dennis is taking the abx and it is doing well.  Pt has no complaints  Review of Systems  Constitutional: Negative for appetite change, diaphoresis, fatigue and unexpected weight change.  Eyes: Negative for pain, redness and visual disturbance.  Respiratory: Negative for cough, chest tightness, shortness of breath and wheezing.   Cardiovascular: Negative for chest pain, palpitations and leg swelling.  Endocrine: Negative for cold intolerance, heat intolerance, polydipsia, polyphagia and polyuria.  Genitourinary: Negative for difficulty urinating, dysuria and frequency.  Skin: Positive for wound.  Neurological: Negative for dizziness, light-headedness, numbness and headaches.    History Past Medical History:  Diagnosis Date  . Anemia   . Angiodysplasia of ascending colon 10/25/2014  . Anxiety   . Arthritis   . Borderline diabetes   . Cancer of the skin, basal cell 09/03/2012  . Cirrhosis ([place])   . Diabetes mellitus without complication ([place])   . Diverticular disease   . 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 GERD (gastroesophageal reflux disease)   . Hypertension   . Iron deficiency anemia due to chronic blood loss 01/19/2015  . Portal hypertensive gastropathy ([place]) 08/02/2016   ? Some GAVE also  . Thyroid disease     Jessica Dennis has a past surgical history that includes Abdominal hysterectomy; Total hip arthroplasty (Bilateral, 1993, 2006); Appendectomy; Tonsillectomy; Colonoscopy; Leg skin lesion  biopsy / excision (12/11/14); Mohs surgery; Esophagogastroduodenoscopy; IR Paracentesis (05/25/2016); and Upper gastrointestinal endoscopy.   Jessica Dennis family history includes Bladder Cancer in Jessica Dennis father; Diabetes in Jessica Dennis mother; Heart attack in Jessica Dennis father.Jessica Dennis reports that Jessica Dennis has never smoked. Jessica Dennis has never used smokeless  tobacco. Jessica Dennis reports that Jessica Dennis does not drink alcohol or use drugs.  Current Outpatient Medications on File Prior to Visit  Medication Sig Dispense Refill  . ACCU-CHEK FASTCLIX LANCETS MISC CHECK BLOOD SUGAR ONCE DAILY. 102 each 6  . ammonium lactate (AMLACTIN) 12 % cream     . Ascorbic Acid (VITAMIN C) 1000 MG tablet Take 1,000 mg by mouth daily.    . Calcium Carbonate-Vitamin D 600-200 MG-UNIT CAPS Take by mouth.    . cephALEXin (KEFLEX) 500 MG capsule Take 1 capsule (500 mg total) by mouth 2 (two) times daily. 20 capsule 0  . glucose blood test strip Accu-Chek [employer]    . Insulin Aspart Prot & Aspart (NOVOLOG MIX 70/30 [place]) Inject into the skin. 30 units in the mornings and 25 at night    . latanoprost (XALATAN) 0.005 % ophthalmic solution Place 1 drop into both eyes at bedtime.     Marland Kitchen levothyroxine (SYNTHROID) 75 MCG tablet Take 1 tablet (75 mcg total) by mouth daily. 90 tablet 1  . LORazepam (ATIVAN) 0.5 MG tablet Take 1 tablet (0.5 mg total) by mouth daily as needed. 90 tablet 1  . Melatonin 5 MG CAPS Take 5 mg by mouth.    . Omega-3 Fatty Acids (FISH OIL) 1000 MG CAPS Take by mouth every morning.    . pantoprazole (PROTONIX) 40 MG tablet TAKE ONE TABLET BY MOUTH DAILY - NEEDS OFFICE VISIT 90 tablet 1  . Prednicarbate 0.1 % CREA Apply topically 2 (two) times daily.     . simvastatin (ZOCOR) 20 MG tablet TAKE ONE TABLET BY MOUTH AT BEDTIME - NEEDS OFFICE VISIT 90 tablet 1  .  spironolactone (ALDACTONE) 50 MG tablet TAKE TWO TABLETS BY MOUTH DAILY 180 tablet 1  . SSD 1 % cream 1 application daily.     Marland Kitchen triamterene-hydrochlorothiazide (MAXZIDE-25) 37.5-25 MG tablet triamterene 37.5 mg-hydrochlorothiazide 25 mg tablet    . vitamin E 100 UNIT capsule Take by mouth daily.     No current facility-administered medications on file prior to visit.      Objective:  Objective  Physical Exam Vitals signs and nursing note reviewed.  Constitutional:      Appearance: Jessica Dennis is  well-developed.  HENT:     Head: Normocephalic and atraumatic.  Eyes:     Conjunctiva/sclera: Conjunctivae normal.  Neck:     Musculoskeletal: Normal range of motion and neck supple.     Thyroid: No thyromegaly.     Vascular: No carotid bruit or JVD.  Cardiovascular:     Rate and Rhythm: Normal rate and regular rhythm.     Heart sounds: Normal heart sounds. No murmur.  Pulmonary:     Effort: Pulmonary effort is normal. No respiratory distress.     Breath sounds: Normal breath sounds. No wheezing or rales.  Chest:     Chest wall: No tenderness.  Skin:      Neurological:     Mental Status: Jessica Dennis is alert and oriented to person, place, and time.    BP (!) 133/59 (BP Location: Right Arm, Cuff Size: Normal)   Pulse 92   Temp (!) 97.3 F (36.3 C) (Temporal)   Resp 12   Ht 5\' 5"  (1.651 m)   Wt 168 lb 3.2 oz (76.3 kg)   SpO2 100%   BMI 27.99 kg/m  Wt Readings from Last 3 Encounters:  11/03/18 168 lb 3.2 oz (76.3 kg)  10/27/18 168 lb 12.8 oz (76.6 kg)  10/08/18 168 lb (76.2 kg)     Lab Results  Component Value Date   WBC 3.8 (L) 10/08/2018   HGB 10.3 (L) 10/08/2018   HCT 32.4 (L) 10/08/2018   PLT 67 (L) 10/08/2018   GLUCOSE 254 (H) 10/27/2018   CHOL 109 10/27/2018   TRIG 73.0 10/27/2018   HDL 47.50 10/27/2018   LDLCALC 47 10/27/2018   ALT 24 10/27/2018   AST 37 10/27/2018   NA 137 10/27/2018   K 4.4 10/27/2018   CL 105 10/27/2018   CREATININE 1.11 10/27/2018   BUN 22 10/27/2018   CO2 24 10/27/2018   TSH 1.78 10/27/2018   INR 1.2 (H) 02/05/2018   HGBA1C 6.3 (H) 03/18/2018   MICROALBUR 1.0 10/27/2018    US Abdomen Limited Ruq  Result Date: 11/28/2017 CLINICAL DATA:  Alcoholic liver cirrhosis with ascites. EXAM: ULTRASOUND ABDOMEN LIMITED RIGHT UPPER QUADRANT COMPARISON:  02/25/2017. FINDINGS: Gallbladder: No gallstones or wall thickening visualized. No sonographic Murphy sign noted by sonographer. Common bile duct: Diameter: 1.1 mm Liver: The liver remains  somewhat small with irregular contours. Again demonstrated is a recanalized umbilical vein. No liver masses are seen. No ascites visualized. Portal vein is patent on color Doppler imaging with normal direction of blood flow towards the liver. IMPRESSION: 1. No liver mass seen. 2. Stable changes of cirrhosis of the liver with a recanalized umbilical vein compatible with portal venous hypertension. Electronically Signed   By: Claudie Revering M.D.   On: 11/28/2017 09:20     Assessment & Plan:  Plan  I have discontinued Octavio Graves "Trudi"'s Flax Seed Oil. I have also changed Jessica Dennis gabapentin. Additionally, I am having Jessica Dennis maintain Jessica Dennis Prednicarbate, latanoprost,  Fish Oil, vitamin C, Calcium Carbonate-Vitamin D, Melatonin, vitamin E, glucose blood, Insulin Aspart Prot & Aspart (NOVOLOG MIX 70/30 [place]), Accu-Chek [employer]ix Lancets, SSD, ammonium lactate, triamterene-hydrochlorothiazide, spironolactone, simvastatin, pantoprazole, levothyroxine, LORazepam, cephALEXin, and tiZANidine.  Meds ordered this encounter  Medications  . gabapentin (NEURONTIN) 100 MG capsule    Sig: Take 1 capsule (100 mg total) by mouth 2 (two) times daily.    Dispense:  180 capsule    Refill:  1  . tiZANidine (ZANAFLEX) 4 MG tablet    Sig: TAKE ONE TABLET BY MOUTH EVERY NIGHT AT BEDTIME AS NEEDED MUSCLE SPASMS    Dispense:  30 tablet    Refill:  2    Problem List Items Addressed This Visit      Unprioritized   Hyperlipidemia associated with type 2 diabetes mellitus ([place])   Open wound, lower leg, left, initial encounter    Healing well  Finish abx  rto prn        Other Visit Diagnoses    Leg cramps       Relevant Medications   gabapentin (NEURONTIN) 100 MG capsule   tiZANidine (ZANAFLEX) 4 MG tablet   Gastroesophageal reflux disease          Follow-up: Return in about 3 months (around 02/03/2019), or if symptoms worsen or fail to improve.  Ann Held, DO

## 2018-11-07 ENCOUNTER — Telehealth: Payer: Self-pay

## 2018-11-07 ENCOUNTER — Other Ambulatory Visit: Payer: Self-pay

## 2018-11-07 ENCOUNTER — Inpatient Hospital Stay: Payer: Medicare HMO | Attending: Hematology & Oncology

## 2018-11-07 DIAGNOSIS — K922 Gastrointestinal hemorrhage, unspecified: Secondary | ICD-10-CM | POA: Insufficient documentation

## 2018-11-07 DIAGNOSIS — D500 Iron deficiency anemia secondary to blood loss (chronic): Secondary | ICD-10-CM | POA: Diagnosis present

## 2018-11-07 DIAGNOSIS — D6959 Other secondary thrombocytopenia: Secondary | ICD-10-CM | POA: Diagnosis not present

## 2018-11-07 LAB — CBC WITH DIFFERENTIAL (CANCER CENTER ONLY)
Abs Immature Granulocytes: 0.02 10*3/uL (ref 0.00–0.07)
Basophils Absolute: 0 10*3/uL (ref 0.0–0.1)
Basophils Relative: 1 %
Eosinophils Absolute: 0.1 10*3/uL (ref 0.0–0.5)
Eosinophils Relative: 3 %
HCT: 30.9 % — ABNORMAL LOW (ref 36.0–46.0)
Hemoglobin: 9.7 g/dL — ABNORMAL LOW (ref 12.0–15.0)
Immature Granulocytes: 1 %
Lymphocytes Relative: 16 %
Lymphs Abs: 0.5 10*3/uL — ABNORMAL LOW (ref 0.7–4.0)
MCH: 29.8 pg (ref 26.0–34.0)
MCHC: 31.4 g/dL (ref 30.0–36.0)
MCV: 94.8 fL (ref 80.0–100.0)
Monocytes Absolute: 0.2 10*3/uL (ref 0.1–1.0)
Monocytes Relative: 7 %
Neutro Abs: 2 10*3/uL (ref 1.7–7.7)
Neutrophils Relative %: 72 %
Platelet Count: 51 10*3/uL — ABNORMAL LOW (ref 150–400)
RBC: 3.26 MIL/uL — ABNORMAL LOW (ref 3.87–5.11)
RDW: 14.7 % (ref 11.5–15.5)
WBC Count: 2.8 10*3/uL — ABNORMAL LOW (ref 4.0–10.5)
nRBC: 0 % (ref 0.0–0.2)

## 2018-11-07 LAB — CMP (CANCER CENTER ONLY)
ALT: 20 U/L (ref 0–44)
AST: 33 U/L (ref 15–41)
Albumin: 3.8 g/dL (ref 3.5–5.0)
Alkaline Phosphatase: 119 U/L (ref 38–126)
Anion gap: 6 (ref 5–15)
BUN: 30 mg/dL — ABNORMAL HIGH (ref 8–23)
CO2: 25 mmol/L (ref 22–32)
Calcium: 9.9 mg/dL (ref 8.9–10.3)
Chloride: 102 mmol/L (ref 98–111)
Creatinine: 1.29 mg/dL — ABNORMAL HIGH (ref 0.44–1.00)
GFR, Est AFR Am: 44 mL/min — ABNORMAL LOW (ref >60)
GFR, Estimated: 38 mL/min — ABNORMAL LOW (ref >60)
Glucose, Bld: 313 mg/dL — ABNORMAL HIGH (ref 70–99)
Potassium: 4.9 mmol/L (ref 3.5–5.1)
Sodium: 133 mmol/L — ABNORMAL LOW (ref 135–145)
Total Bilirubin: 1 mg/dL (ref 0.3–1.2)
Total Protein: 5.9 g/dL — ABNORMAL LOW (ref 6.5–8.1)

## 2018-11-07 LAB — RETICULOCYTES
Immature Retic Fract: 13.3 % (ref 2.3–15.9)
RBC.: 3.23 MIL/uL — ABNORMAL LOW (ref 3.87–5.11)
Retic Count, Absolute: 95.3 10*3/uL (ref 19.0–186.0)
Retic Ct Pct: 3 % (ref 0.4–3.1)

## 2018-11-07 LAB — SAMPLE TO BLOOD BANK

## 2018-11-07 NOTE — Telephone Encounter (Addendum)
Message left on personalized VM & results routed to PCP. dph  ----- Message from Eliezer Bottom, NP sent at 11/07/2018  2:42 PM EDT ----- Blood sugar is way high/ IS she taking her meds?!?   ----- Message ----- From: Buel Ream, [facility] [place] Sent: 11/07/2018   2:01 PM EDT To: Eliezer Bottom, NP

## 2018-11-10 LAB — IRON AND TIBC
Iron: 38 ug/dL — ABNORMAL LOW (ref 41–142)
Saturation Ratios: 13 % — ABNORMAL LOW (ref 21–57)
TIBC: 291 ug/dL (ref 236–444)
UIBC: 254 ug/dL (ref 120–384)

## 2018-11-10 LAB — FERRITIN: Ferritin: 30 ng/mL (ref 11–307)

## 2018-11-11 ENCOUNTER — Telehealth: Payer: Self-pay | Admitting: Hematology & Oncology

## 2018-11-11 NOTE — Telephone Encounter (Signed)
Called and LMVM for patient with date/time of appointment added per 10/19 sch msg

## 2018-11-12 ENCOUNTER — Other Ambulatory Visit: Payer: Self-pay | Admitting: Family Medicine

## 2018-11-12 DIAGNOSIS — R69 Illness, unspecified: Secondary | ICD-10-CM | POA: Diagnosis not present

## 2018-11-13 ENCOUNTER — Inpatient Hospital Stay: Payer: Medicare HMO

## 2018-11-13 ENCOUNTER — Other Ambulatory Visit: Payer: Self-pay

## 2018-11-13 VITALS — BP 142/55 | HR 77 | Resp 18

## 2018-11-13 DIAGNOSIS — D500 Iron deficiency anemia secondary to blood loss (chronic): Secondary | ICD-10-CM | POA: Diagnosis not present

## 2018-11-13 DIAGNOSIS — K922 Gastrointestinal hemorrhage, unspecified: Secondary | ICD-10-CM | POA: Diagnosis not present

## 2018-11-13 DIAGNOSIS — K5520 Angiodysplasia of colon without hemorrhage: Secondary | ICD-10-CM

## 2018-11-13 DIAGNOSIS — D6959 Other secondary thrombocytopenia: Secondary | ICD-10-CM | POA: Diagnosis not present

## 2018-11-13 DIAGNOSIS — R195 Other fecal abnormalities: Secondary | ICD-10-CM

## 2018-11-13 DIAGNOSIS — K219 Gastro-esophageal reflux disease without esophagitis: Secondary | ICD-10-CM

## 2018-11-13 MED ORDER — SODIUM CHLORIDE 0.9 % IV SOLN
Freq: Once | INTRAVENOUS | Status: AC
  Administered 2018-11-13: 12:00:00 via INTRAVENOUS
  Filled 2018-11-13: qty 250

## 2018-11-13 MED ORDER — SODIUM CHLORIDE 0.9 % IV SOLN
200.0000 mg | Freq: Once | INTRAVENOUS | Status: AC
  Administered 2018-11-13: 200 mg via INTRAVENOUS
  Filled 2018-11-13: qty 10

## 2018-11-14 DIAGNOSIS — M25811 Other specified joint disorders, right shoulder: Secondary | ICD-10-CM | POA: Diagnosis not present

## 2018-11-14 DIAGNOSIS — M19011 Primary osteoarthritis, right shoulder: Secondary | ICD-10-CM | POA: Diagnosis not present

## 2018-11-20 ENCOUNTER — Telehealth: Payer: Self-pay | Admitting: Internal Medicine

## 2018-11-20 NOTE — Telephone Encounter (Signed)
Dr. Carlean Purl reviewed her Recall Assessment for Endoscopy that is due for cirhosis, screening for varices. Patient can be scheduled directly with a previsit instead of an office visit.

## 2018-11-24 ENCOUNTER — Encounter: Payer: Self-pay | Admitting: Neurology

## 2018-11-28 ENCOUNTER — Other Ambulatory Visit: Payer: Self-pay | Admitting: Family Medicine

## 2018-11-28 ENCOUNTER — Other Ambulatory Visit: Payer: Self-pay

## 2018-11-28 ENCOUNTER — Ambulatory Visit (AMBULATORY_SURGERY_CENTER): Payer: Medicare HMO | Admitting: *Deleted

## 2018-11-28 ENCOUNTER — Encounter: Payer: Self-pay | Admitting: Internal Medicine

## 2018-11-28 VITALS — Temp 97.8°F | Ht 65.0 in | Wt 159.0 lb

## 2018-11-28 DIAGNOSIS — I8500 Esophageal varices without bleeding: Secondary | ICD-10-CM

## 2018-11-28 DIAGNOSIS — E039 Hypothyroidism, unspecified: Secondary | ICD-10-CM

## 2018-11-28 DIAGNOSIS — Z1159 Encounter for screening for other viral diseases: Secondary | ICD-10-CM

## 2018-11-28 NOTE — Progress Notes (Signed)
Patient is here in-person for PV. Patient denies any allergies to eggs or soy. Patient denies any problems with anesthesia/sedation. Patient denies any oxygen use at home. Patient denies taking any diet/weight loss medications or blood thinners. Patient is not being treated for MRSA or C-diff. EMMI education assisgned to the patient for the procedure, this was explained and instructions given to patient. COVID-19 screening test is on 11/16, the pt is aware. Pt is aware that care partner will wait in the car during procedure; if they feel like they will be too hot or cold to wait in the car; they may wait in the 4 th floor lobby. Patient is aware to bring only one care partner. We want them to wear a mask (we do not have any that we can provide them), practice social distancing, and we will check their temperatures when they get here.  I did remind the patient that their care partner needs to stay in the parking lot the entire time and have a cell phone available, we will call them when the pt is ready for discharge. Patient will wear mask into building.

## 2018-12-05 ENCOUNTER — Ambulatory Visit: Payer: Medicare HMO | Admitting: Neurology

## 2018-12-08 ENCOUNTER — Inpatient Hospital Stay (HOSPITAL_BASED_OUTPATIENT_CLINIC_OR_DEPARTMENT_OTHER): Payer: Medicare HMO | Admitting: Family

## 2018-12-08 ENCOUNTER — Other Ambulatory Visit: Payer: Self-pay

## 2018-12-08 ENCOUNTER — Inpatient Hospital Stay: Payer: Medicare HMO | Attending: Hematology & Oncology

## 2018-12-08 VITALS — BP 154/65 | HR 83 | Temp 97.3°F | Resp 18 | Wt 164.5 lb

## 2018-12-08 DIAGNOSIS — K7460 Unspecified cirrhosis of liver: Secondary | ICD-10-CM | POA: Diagnosis not present

## 2018-12-08 DIAGNOSIS — D696 Thrombocytopenia, unspecified: Secondary | ICD-10-CM

## 2018-12-08 DIAGNOSIS — D500 Iron deficiency anemia secondary to blood loss (chronic): Secondary | ICD-10-CM | POA: Diagnosis not present

## 2018-12-08 DIAGNOSIS — D6959 Other secondary thrombocytopenia: Secondary | ICD-10-CM | POA: Insufficient documentation

## 2018-12-08 DIAGNOSIS — Z79899 Other long term (current) drug therapy: Secondary | ICD-10-CM | POA: Insufficient documentation

## 2018-12-08 DIAGNOSIS — R161 Splenomegaly, not elsewhere classified: Secondary | ICD-10-CM | POA: Diagnosis not present

## 2018-12-08 DIAGNOSIS — K922 Gastrointestinal hemorrhage, unspecified: Secondary | ICD-10-CM | POA: Insufficient documentation

## 2018-12-08 LAB — CBC WITH DIFFERENTIAL (CANCER CENTER ONLY)
Abs Immature Granulocytes: 0.03 10*3/uL (ref 0.00–0.07)
Basophils Absolute: 0 10*3/uL (ref 0.0–0.1)
Basophils Relative: 0 %
Eosinophils Absolute: 0.1 10*3/uL (ref 0.0–0.5)
Eosinophils Relative: 2 %
HCT: 33.8 % — ABNORMAL LOW (ref 36.0–46.0)
Hemoglobin: 10.9 g/dL — ABNORMAL LOW (ref 12.0–15.0)
Immature Granulocytes: 0 %
Lymphocytes Relative: 12 %
Lymphs Abs: 0.9 10*3/uL (ref 0.7–4.0)
MCH: 29.5 pg (ref 26.0–34.0)
MCHC: 32.2 g/dL (ref 30.0–36.0)
MCV: 91.6 fL (ref 80.0–100.0)
Monocytes Absolute: 0.5 10*3/uL (ref 0.1–1.0)
Monocytes Relative: 7 %
Neutro Abs: 5.5 10*3/uL (ref 1.7–7.7)
Neutrophils Relative %: 79 %
Platelet Count: 66 10*3/uL — ABNORMAL LOW (ref 150–400)
RBC: 3.69 MIL/uL — ABNORMAL LOW (ref 3.87–5.11)
RDW: 15 % (ref 11.5–15.5)
WBC Count: 7 10*3/uL (ref 4.0–10.5)
nRBC: 0 % (ref 0.0–0.2)

## 2018-12-08 LAB — CMP (CANCER CENTER ONLY)
ALT: 30 U/L (ref 0–44)
AST: 33 U/L (ref 15–41)
Albumin: 4.5 g/dL (ref 3.5–5.0)
Alkaline Phosphatase: 114 U/L (ref 38–126)
Anion gap: 8 (ref 5–15)
BUN: 28 mg/dL — ABNORMAL HIGH (ref 8–23)
CO2: 24 mmol/L (ref 22–32)
Calcium: 10.2 mg/dL (ref 8.9–10.3)
Chloride: 103 mmol/L (ref 98–111)
Creatinine: 1.09 mg/dL — ABNORMAL HIGH (ref 0.44–1.00)
GFR, Est AFR Am: 54 mL/min — ABNORMAL LOW (ref >60)
GFR, Estimated: 47 mL/min — ABNORMAL LOW (ref >60)
Glucose, Bld: 118 mg/dL — ABNORMAL HIGH (ref 70–99)
Potassium: 4.6 mmol/L (ref 3.5–5.1)
Sodium: 135 mmol/L (ref 135–145)
Total Bilirubin: 1.7 mg/dL — ABNORMAL HIGH (ref 0.3–1.2)
Total Protein: 6.5 g/dL (ref 6.5–8.1)

## 2018-12-08 LAB — SAMPLE TO BLOOD BANK

## 2018-12-08 LAB — RETICULOCYTES
Immature Retic Fract: 17.2 % — ABNORMAL HIGH (ref 2.3–15.9)
RBC.: 3.69 MIL/uL — ABNORMAL LOW (ref 3.87–5.11)
Retic Count, Absolute: 101.1 10*3/uL (ref 19.0–186.0)
Retic Ct Pct: 2.7 % (ref 0.4–3.1)

## 2018-12-08 NOTE — Progress Notes (Signed)
Hematology and Oncology Follow Up Visit  Jessica Dennis [id number] [birth year] 85 y.o. 12/08/2018   Principle Diagnosis:  Chronic thrombocytopenia secondary to cirrhosis and splenomegaly Iron deficiency anemia secondary to GI bleed  Current Therapy:   IV iron as indicated Red blood cell transfusion as needed for variceal bleeding   Interim History:  Jessica Dennis is here today for follow-up. She is doing well and has no complaints at this time.  She has not noted any blood loss. No bruising or petechiae.  No falls or syncope.  No fever, chills, n/v, cough, rash, dizziness, SOB, chest pain, palpitations, abdominal pain or changes in bowel or bladder habits.  No swelling, tenderness, numbness or tingling in her extremities at this time.  She has a good appetite and is hydrating well. Her weight is stable.   ECOG Performance Status: 1 - Symptomatic but completely ambulatory  Medications:  Allergies as of 12/08/2018   No Known Allergies     Medication List       Accurate as of December 08, 2018  3:34 PM. If you have any questions, ask your nurse or doctor.        Accu-Chek FastClix Lancets Misc CHECK FOR BLOOD SUGAR DAILY   ammonium lactate 12 % cream Commonly known as: AMLACTIN   Calcium Carbonate-Vitamin D 600-200 MG-UNIT Caps Take by mouth.   Fish Oil 1000 MG Caps Take by mouth every morning.   gabapentin 100 MG capsule Commonly known as: NEURONTIN Take 1 capsule (100 mg total) by mouth 2 (two) times daily.   glucose blood test strip Accu-Chek Fastclix Lancet Drum   latanoprost 0.005 % ophthalmic solution Commonly known as: XALATAN Place 1 drop into both eyes at bedtime.   levothyroxine 75 MCG tablet Commonly known as: SYNTHROID TAKE ONE TABLET BY MOUTH DAILY   LORazepam 0.5 MG tablet Commonly known as: ATIVAN Take 1 tablet (0.5 mg total) by mouth daily as needed.   Melatonin 5 MG Caps Take 5 mg by mouth.   NOVOLOG MIX 70/30 [place] Inject into the  skin. 35 units in the mornings and 25 at night   pantoprazole 40 MG tablet Commonly known as: PROTONIX TAKE ONE TABLET BY MOUTH DAILY - NEEDS OFFICE VISIT   Prednicarbate 0.1 % Crea Apply topically 2 (two) times daily.   simvastatin 20 MG tablet Commonly known as: ZOCOR TAKE ONE TABLET BY MOUTH AT BEDTIME - NEEDS OFFICE VISIT   spironolactone 50 MG tablet Commonly known as: ALDACTONE TAKE TWO TABLETS BY MOUTH DAILY   SSD 1 % cream Generic drug: silver sulfADIAZINE 1 application daily.   tiZANidine 4 MG tablet Commonly known as: ZANAFLEX TAKE ONE TABLET BY MOUTH EVERY NIGHT AT BEDTIME AS NEEDED MUSCLE SPASMS   triamterene-hydrochlorothiazide 37.5-25 MG tablet Commonly known as: MAXZIDE-25 triamterene 37.5 mg-hydrochlorothiazide 25 mg tablet   vitamin C 1000 MG tablet Take 1,000 mg by mouth daily.   vitamin E 100 UNIT capsule Take by mouth daily.       Allergies: No Known Allergies  Past Medical History, Surgical history, Social history, and Family History were reviewed and updated.  Review of Systems: All other 10 point review of systems is negative.   Physical Exam:  weight is 164 lb 8 oz (74.6 kg). Her temperature is 97.3 F (36.3 C) (abnormal). Her blood pressure is 154/65 (abnormal) and her pulse is 83. Her respiration is 18 and oxygen saturation is 97%.   Wt Readings from Last 3 Encounters:  12/08/18 164 lb 8 oz (  74.6 kg)  11/28/18 159 lb (72.1 kg)  11/03/18 168 lb 3.2 oz (76.3 kg)    Ocular: Sclerae unicteric, pupils equal, round and reactive to light Ear-nose-throat: Oropharynx clear, dentition fair Lymphatic: No cervical or supraclavicular adenopathy Lungs no rales or rhonchi, good excursion bilaterally Heart regular rate and rhythm, no murmur appreciated Abd soft, nontender, positive bowel sounds, no liver or spleen tip palpated on exam, no fluid wave  MSK no focal spinal tenderness, no joint edema Neuro: non-focal, well-oriented, appropriate  affect Breasts: Deferred   Lab Results  Component Value Date   WBC 7.0 12/08/2018   HGB 10.9 (L) 12/08/2018   HCT 33.8 (L) 12/08/2018   MCV 91.6 12/08/2018   PLT 66 (L) 12/08/2018   Lab Results  Component Value Date   FERRITIN 30 11/07/2018   IRON 38 (L) 11/07/2018   TIBC 291 11/07/2018   UIBC 254 11/07/2018   IRONPCTSAT 13 (L) 11/07/2018   Lab Results  Component Value Date   RETICCTPCT 2.7 12/08/2018   RBC 3.69 (L) 12/08/2018   RBC 3.69 (L) 12/08/2018   No results found for: KPAFRELGTCHN, LAMBDASER, KAPLAMBRATIO No results found for: IGGSERUM, IGA, IGMSERUM No results found for: Kathrynn Ducking, MSPIKE, SPEI   Chemistry      Component Value Date/Time   NA 133 (L) 11/07/2018 1340   NA 138 12/18/2016 1314   NA 139 03/05/2016 1139   K 4.9 11/07/2018 1340   K 4.3 12/18/2016 1314   K 3.6 03/05/2016 1139   CL 102 11/07/2018 1340   CL 101 12/18/2016 1314   CO2 25 11/07/2018 1340   CO2 28 12/18/2016 1314   CO2 26 03/05/2016 1139   BUN 30 (H) 11/07/2018 1340   BUN 33 (H) 12/18/2016 1314   BUN 14.3 03/05/2016 1139   CREATININE 1.29 (H) 11/07/2018 1340   CREATININE 1.06 (H) 02/14/2018 1544   CREATININE 0.7 03/05/2016 1139      Component Value Date/Time   CALCIUM 9.9 11/07/2018 1340   CALCIUM 10.0 12/18/2016 1314   CALCIUM 9.3 03/05/2016 1139   ALKPHOS 119 11/07/2018 1340   ALKPHOS 94 (H) 12/18/2016 1314   ALKPHOS 127 03/05/2016 1139   AST 33 11/07/2018 1340   AST 29 03/05/2016 1139   ALT 20 11/07/2018 1340   ALT 26 12/18/2016 1314   ALT 22 03/05/2016 1139   BILITOT 1.0 11/07/2018 1340   BILITOT 1.75 (H) 03/05/2016 1139       Impression and Plan: Jessica Dennis is a very pleasant 85 yo female with chronic thrombocytopenia and iron deficiency anemia secondary to intermittent GI blood loss. We will see what her iron studies look like and bring her back in for infusion if needed.  Platelets are stable at 66. No symptoms at  this time.  We will plan to check labs in 1 month and follow-up in 2 months.  She will contact our office with any questions or concerns. We can certainly see her sooner if needed.   [name], NP 11/16/20203:34 PM

## 2018-12-09 ENCOUNTER — Telehealth: Payer: Self-pay | Admitting: Hematology & Oncology

## 2018-12-09 ENCOUNTER — Other Ambulatory Visit: Payer: Self-pay | Admitting: Family

## 2018-12-09 LAB — IRON AND TIBC
Iron: 49 ug/dL (ref 41–142)
Saturation Ratios: 14 % — ABNORMAL LOW (ref 21–57)
TIBC: 343 ug/dL (ref 236–444)
UIBC: 294 ug/dL (ref 120–384)

## 2018-12-09 LAB — FERRITIN: Ferritin: 35 ng/mL (ref 11–307)

## 2018-12-09 NOTE — Telephone Encounter (Signed)
Called and LMVM regarding appointments scheduled per 11/17 sch msg

## 2018-12-11 ENCOUNTER — Encounter: Payer: Medicare HMO | Admitting: Internal Medicine

## 2018-12-11 ENCOUNTER — Inpatient Hospital Stay: Payer: Medicare HMO

## 2018-12-11 ENCOUNTER — Other Ambulatory Visit: Payer: Self-pay

## 2018-12-11 VITALS — BP 127/54 | HR 74 | Resp 17

## 2018-12-11 DIAGNOSIS — K922 Gastrointestinal hemorrhage, unspecified: Secondary | ICD-10-CM | POA: Diagnosis not present

## 2018-12-11 DIAGNOSIS — K219 Gastro-esophageal reflux disease without esophagitis: Secondary | ICD-10-CM

## 2018-12-11 DIAGNOSIS — K5520 Angiodysplasia of colon without hemorrhage: Secondary | ICD-10-CM

## 2018-12-11 DIAGNOSIS — D500 Iron deficiency anemia secondary to blood loss (chronic): Secondary | ICD-10-CM

## 2018-12-11 DIAGNOSIS — K7460 Unspecified cirrhosis of liver: Secondary | ICD-10-CM | POA: Diagnosis not present

## 2018-12-11 DIAGNOSIS — D6959 Other secondary thrombocytopenia: Secondary | ICD-10-CM | POA: Diagnosis not present

## 2018-12-11 DIAGNOSIS — R195 Other fecal abnormalities: Secondary | ICD-10-CM

## 2018-12-11 DIAGNOSIS — Z79899 Other long term (current) drug therapy: Secondary | ICD-10-CM | POA: Diagnosis not present

## 2018-12-11 DIAGNOSIS — R161 Splenomegaly, not elsewhere classified: Secondary | ICD-10-CM | POA: Diagnosis not present

## 2018-12-11 MED ORDER — SODIUM CHLORIDE 0.9 % IV SOLN
Freq: Once | INTRAVENOUS | Status: AC
  Administered 2018-12-11: 14:00:00 via INTRAVENOUS
  Filled 2018-12-11: qty 250

## 2018-12-11 MED ORDER — SODIUM CHLORIDE 0.9 % IV SOLN
200.0000 mg | Freq: Once | INTRAVENOUS | Status: AC
  Administered 2018-12-11: 200 mg via INTRAVENOUS
  Filled 2018-12-11: qty 10

## 2018-12-11 NOTE — Patient Instructions (Signed)

## 2018-12-12 NOTE — Progress Notes (Deleted)
Jessica Dennis was seen today in the movement disorders clinic for neurologic consultation at the request of Ann Held, *.  The consultation is for the evaluation of intention tremor of the R hand.  The records that were made available to me were reviewed.  Tremor: {yes no:314532}   How long has it been going on? ***  At rest or with activation?  ***  When is it noted the most?  ***  Fam hx of tremor?  {yes [id number]  Located where?  ***  Affected by caffeine:  {yes no:314532}  Affected by alcohol:  {yes no:314532}  Affected by stress:  {yes no:314532}  Affected by fatigue:  {yes no:314532}  Spills soup if on spoon:  {yes no:314532}  Spills glass of liquid if full:  {yes no:314532}  Affects ADL's (tying shoes, brushing teeth, etc):  {yes no:314532}  Tremor inducing meds:  {yes no:314532}  Other Specific Symptoms:  Voice: *** Sleep: ***  Vivid Dreams:  {yes no:314532}  Acting out dreams:  {yes no:314532} Wet Pillows: {yes no:314532} Postural symptoms:  {yes no:314532}  Falls?  {yes no:314532} Bradykinesia symptoms: {parkinson brady:18041} Loss of smell:  {yes no:314532} Loss of taste:  {yes no:314532} Urinary Incontinence:  {yes no:314532} Difficulty Swallowing:  {yes no:314532} Handwriting, micrographia: {yes no:314532} Trouble with ADL's:  {yes no:314532}  Trouble buttoning clothing: {yes no:314532} Depression:  {yes no:314532} Memory changes:  {yes no:314532} Hallucinations:  {yes no:314532}  visual distortions: {yes no:314532} N/V:  {yes no:314532} Lightheaded:  {yes no:314532}  Syncope: {yes no:314532} Diplopia:  {yes no:314532} Dyskinesia:  {yes no:314532}  Neuroimaging of the brain has not previously been performed.  It *** available for my review today.  PREVIOUS MEDICATIONS: {Parkinson's RX:18200}  ALLERGIES:  No Known Allergies  CURRENT MEDICATIONS:  Current Outpatient Medications  Medication Instructions  . Accu-Chek FastClix Lancets MISC  CHECK FOR BLOOD SUGAR DAILY  . ammonium lactate (AMLACTIN) 12 % cream No dose, route, or frequency recorded.  . Calcium Carbonate-Vitamin D 600-200 MG-UNIT CAPS Oral  . gabapentin (NEURONTIN) 100 mg, Oral, 2 times daily  . glucose blood test strip Accu-Chek [employer]  . Insulin Aspart Prot & Aspart (NOVOLOG MIX 70/30 [place]) Subcutaneous, 35 units in the mornings and 25 at night  . latanoprost (XALATAN) 0.005 % ophthalmic solution 1 drop, Daily at bedtime  . levothyroxine (SYNTHROID) 75 MCG tablet TAKE ONE TABLET BY MOUTH DAILY  . LORazepam (ATIVAN) 0.5 mg, Oral, Daily PRN  . Melatonin 5 mg, Oral  . Omega-3 Fatty Acids (FISH OIL) 1000 MG CAPS  Every morning - 10a  . pantoprazole (PROTONIX) 40 MG tablet TAKE ONE TABLET BY MOUTH DAILY - NEEDS OFFICE VISIT  . Prednicarbate 0.1 % CREA 2 times daily  . simvastatin (ZOCOR) 20 MG tablet TAKE ONE TABLET BY MOUTH AT BEDTIME - NEEDS OFFICE VISIT  . spironolactone (ALDACTONE) 50 MG tablet TAKE TWO TABLETS BY MOUTH DAILY  . SSD 1 % cream 1 application, Daily  . tiZANidine (ZANAFLEX) 4 MG tablet TAKE ONE TABLET BY MOUTH EVERY NIGHT AT BEDTIME AS NEEDED MUSCLE SPASMS  . triamterene-hydrochlorothiazide (MAXZIDE-25) 37.5-25 MG tablet triamterene 37.5 mg-hydrochlorothiazide 25 mg tablet  . vitamin C 1,000 mg, Daily  . vitamin E 100 UNIT capsule Oral, Daily    PAST MEDICAL HISTORY:   Past Medical History:  Diagnosis Date  . Anemia   . Angiodysplasia of ascending colon 10/25/2014  . Anxiety   . Arthritis   . Borderline diabetes   . Cancer  of the skin, basal cell 09/03/2012  . Cirrhosis ([place])   . Diabetes mellitus without complication ([place])   . Diverticular disease   . 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 GERD (gastroesophageal reflux disease)   . Hypertension   . Iron deficiency anemia due to chronic blood loss 01/19/2015  . Portal hypertensive gastropathy ([place]) 08/02/2016   ? Some GAVE also  . Thyroid disease     PAST  SURGICAL HISTORY:   Past Surgical History:  Procedure Laterality Date  . ABDOMINAL HYSTERECTOMY    . APPENDECTOMY    . COLONOSCOPY    . ESOPHAGOGASTRODUODENOSCOPY    . IR PARACENTESIS  05/25/2016  . LEG SKIN LESION  BIOPSY / EXCISION  12/11/14  . MOHS SURGERY     ankle  . TONSILLECTOMY    . TOTAL HIP ARTHROPLASTY Bilateral 1993, 2006  . UPPER GASTROINTESTINAL ENDOSCOPY  09/09/2017    SOCIAL HISTORY:   Social History   Socioeconomic History  . Marital status: Divorced    Spouse name: Not on file  . Number of children: 2  . Years of education: Not on file  . Highest education level: Not on file  Occupational History  . Occupation: retired  Scientific laboratory technician  . Financial resource strain: Not on file  . Food insecurity    Worry: Not on file    Inability: Not on file  . Transportation needs    Medical: Not on file    Non-medical: Not on file  Tobacco Use  . Smoking status: Never Smoker  . Smokeless tobacco: Never Used  . Tobacco comment: NEVER USED TOBACCO  Substance and Sexual Activity  . Alcohol use: No    Alcohol/week: 2.0 standard drinks    Types: 2 Standard drinks or equivalent per week    Comment: rare occassion  . Drug use: No  . Sexual activity: Never  Lifestyle  . Physical activity    Days per week: Not on file    Minutes per session: Not on file  . Stress: Not on file  Relationships  . Social Herbalist on phone: Not on file    Gets together: Not on file    Attends religious service: Not on file    Active member of club or organization: Not on file    Attends meetings of clubs or organizations: Not on file    Relationship status: Not on file  . Intimate partner violence    Fear of current or ex partner: Not on file    Emotionally abused: Not on file    Physically abused: Not on file    Forced sexual activity: Not on file  Other Topics Concern  . Not on file  Social History Narrative   The patient is divorced. She is originally from [place].  She has 2 sons. 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   08/10/2014    FAMILY HISTORY:   Family Status  Relation Name Status  . Father  Deceased  . Mother  Deceased  . Neg Hx  (Not Specified)    ROS:  ROS  PHYSICAL EXAMINATION:    VITALS:  There were no vitals filed for this visit.  GEN:  The patient appears stated age and is in NAD. HEENT:  Normocephalic, atraumatic.  The mucous membranes are moist. The superficial temporal arteries are without ropiness or tenderness. CV:  RRR Lungs:  CTAB Neck/HEME:  There are no carotid bruits bilaterally.  Neurological examination:  Orientation: The patient is alert and oriented x3. Fund of knowledge is appropriate.  Recent and remote memory are intact.  Attention and concentration are normal.    Able to name objects and repeat phrases. Cranial nerves: There is good facial symmetry. Pupils are equal round and reactive to light bilaterally. Fundoscopic exam reveals clear margins bilaterally. Extraocular muscles are intact. The visual fields are full to confrontational testing. The speech is fluent and clear. Soft palate rises symmetrically and there is no tongue deviation. Hearing is intact to conversational tone. Sensation: Sensation is intact to light and pinprick throughout (facial, trunk, extremities). Vibration is intact at the bilateral big toe. There is no extinction with double simultaneous stimulation. There is no sensory dermatomal level identified. Motor: Strength is 5/5 in the bilateral upper and lower extremities.   Shoulder shrug is equal and symmetric.  There is no pronator drift. Deep tendon reflexes: Deep tendon reflexes are 2/4 at the bilateral biceps, triceps, brachioradialis, patella and achilles. Plantar responses are downgoing bilaterally.  Movement examination: Tone: There is ***tone in the bilateral upper extremities.  The tone in the lower extremities is ***.  Abnormal movements: ***  Coordination:  There is *** decremation with RAM's, *** Gait and Station: The patient has *** difficulty arising out of a deep-seated chair without the use of the hands. The patient's stride length is ***.  The patient has a *** pull test.        Chemistry      Component Value Date/Time   NA 135 12/08/2018 1504   NA 138 12/18/2016 1314   NA 139 03/05/2016 1139   K 4.6 12/08/2018 1504   K 4.3 12/18/2016 1314   K 3.6 03/05/2016 1139   CL 103 12/08/2018 1504   CL 101 12/18/2016 1314   CO2 24 12/08/2018 1504   CO2 28 12/18/2016 1314   CO2 26 03/05/2016 1139   BUN 28 (H) 12/08/2018 1504   BUN 33 (H) 12/18/2016 1314   BUN 14.3 03/05/2016 1139   CREATININE 1.09 (H) 12/08/2018 1504   CREATININE 1.06 (H) 02/14/2018 1544   CREATININE 0.7 03/05/2016 1139      Component Value Date/Time   CALCIUM 10.2 12/08/2018 1504   CALCIUM 10.0 12/18/2016 1314   CALCIUM 9.3 03/05/2016 1139   ALKPHOS 114 12/08/2018 1504   ALKPHOS 94 (H) 12/18/2016 1314   ALKPHOS 127 03/05/2016 1139   AST 33 12/08/2018 1504   AST 29 03/05/2016 1139   ALT 30 12/08/2018 1504   ALT 26 12/18/2016 1314   ALT 22 03/05/2016 1139   BILITOT 1.7 (H) 12/08/2018 1504   BILITOT 1.75 (H) 03/05/2016 1139     Lab Results  Component Value Date   TSH 1.78 10/27/2018     ASSESSMENT/PLAN:  ***  Cc:  Ann Held, DO

## 2018-12-15 ENCOUNTER — Ambulatory Visit: Payer: Medicare HMO

## 2018-12-15 ENCOUNTER — Other Ambulatory Visit: Payer: Self-pay

## 2018-12-15 ENCOUNTER — Inpatient Hospital Stay: Payer: Medicare HMO

## 2018-12-15 VITALS — BP 157/58 | HR 82 | Temp 97.0°F | Resp 18

## 2018-12-15 DIAGNOSIS — D500 Iron deficiency anemia secondary to blood loss (chronic): Secondary | ICD-10-CM

## 2018-12-15 DIAGNOSIS — R161 Splenomegaly, not elsewhere classified: Secondary | ICD-10-CM | POA: Diagnosis not present

## 2018-12-15 DIAGNOSIS — D6959 Other secondary thrombocytopenia: Secondary | ICD-10-CM | POA: Diagnosis not present

## 2018-12-15 DIAGNOSIS — K922 Gastrointestinal hemorrhage, unspecified: Secondary | ICD-10-CM | POA: Diagnosis not present

## 2018-12-15 DIAGNOSIS — K5520 Angiodysplasia of colon without hemorrhage: Secondary | ICD-10-CM

## 2018-12-15 DIAGNOSIS — K219 Gastro-esophageal reflux disease without esophagitis: Secondary | ICD-10-CM

## 2018-12-15 DIAGNOSIS — Z79899 Other long term (current) drug therapy: Secondary | ICD-10-CM | POA: Diagnosis not present

## 2018-12-15 DIAGNOSIS — R195 Other fecal abnormalities: Secondary | ICD-10-CM

## 2018-12-15 DIAGNOSIS — K7460 Unspecified cirrhosis of liver: Secondary | ICD-10-CM | POA: Diagnosis not present

## 2018-12-15 MED ORDER — SODIUM CHLORIDE 0.9 % IV SOLN
200.0000 mg | Freq: Once | INTRAVENOUS | Status: AC
  Administered 2018-12-15: 200 mg via INTRAVENOUS
  Filled 2018-12-15: qty 10

## 2018-12-15 MED ORDER — SODIUM CHLORIDE 0.9 % IV SOLN
200.0000 mg | Freq: Once | INTRAVENOUS | Status: DC
  Filled 2018-12-15: qty 10

## 2018-12-15 MED ORDER — SODIUM CHLORIDE 0.9 % IV SOLN
Freq: Once | INTRAVENOUS | Status: AC
  Administered 2018-12-15: 14:00:00 via INTRAVENOUS
  Filled 2018-12-15: qty 250

## 2018-12-15 NOTE — Patient Instructions (Signed)

## 2018-12-16 ENCOUNTER — Ambulatory Visit: Payer: Medicare HMO | Admitting: Neurology

## 2018-12-17 ENCOUNTER — Ambulatory Visit: Payer: Medicare HMO

## 2018-12-22 ENCOUNTER — Inpatient Hospital Stay: Payer: Medicare HMO

## 2018-12-26 ENCOUNTER — Inpatient Hospital Stay: Payer: Medicare HMO | Attending: Hematology & Oncology

## 2018-12-26 DIAGNOSIS — D696 Thrombocytopenia, unspecified: Secondary | ICD-10-CM | POA: Insufficient documentation

## 2018-12-26 DIAGNOSIS — D500 Iron deficiency anemia secondary to blood loss (chronic): Secondary | ICD-10-CM | POA: Insufficient documentation

## 2018-12-26 DIAGNOSIS — Z8719 Personal history of other diseases of the digestive system: Secondary | ICD-10-CM | POA: Insufficient documentation

## 2018-12-29 ENCOUNTER — Inpatient Hospital Stay: Payer: Medicare HMO

## 2019-01-01 ENCOUNTER — Other Ambulatory Visit: Payer: Self-pay

## 2019-01-01 ENCOUNTER — Inpatient Hospital Stay: Payer: Medicare HMO

## 2019-01-01 VITALS — BP 134/64 | HR 82 | Temp 97.0°F | Resp 16

## 2019-01-01 DIAGNOSIS — R195 Other fecal abnormalities: Secondary | ICD-10-CM

## 2019-01-01 DIAGNOSIS — D696 Thrombocytopenia, unspecified: Secondary | ICD-10-CM | POA: Diagnosis not present

## 2019-01-01 DIAGNOSIS — D500 Iron deficiency anemia secondary to blood loss (chronic): Secondary | ICD-10-CM

## 2019-01-01 DIAGNOSIS — Z8719 Personal history of other diseases of the digestive system: Secondary | ICD-10-CM | POA: Diagnosis not present

## 2019-01-01 DIAGNOSIS — K5520 Angiodysplasia of colon without hemorrhage: Secondary | ICD-10-CM

## 2019-01-01 DIAGNOSIS — K219 Gastro-esophageal reflux disease without esophagitis: Secondary | ICD-10-CM

## 2019-01-01 MED ORDER — SODIUM CHLORIDE 0.9 % IV SOLN
INTRAVENOUS | Status: DC
  Administered 2019-01-01: 14:00:00 via INTRAVENOUS
  Filled 2019-01-01: qty 250

## 2019-01-01 MED ORDER — SODIUM CHLORIDE 0.9 % IV SOLN
200.0000 mg | Freq: Once | INTRAVENOUS | Status: AC
  Administered 2019-01-01: 200 mg via INTRAVENOUS
  Filled 2019-01-01: qty 10

## 2019-01-01 NOTE — Patient Instructions (Signed)

## 2019-01-07 ENCOUNTER — Inpatient Hospital Stay: Payer: Medicare HMO

## 2019-01-09 ENCOUNTER — Other Ambulatory Visit: Payer: Medicare HMO

## 2019-01-13 NOTE — Progress Notes (Deleted)
Jessica Dennis was seen today in the movement disorders clinic for neurologic consultation at the request of Ann Held, *.  The consultation is for the evaluation of intention tremor of the R hand.  The records that were made available to me were reviewed.  Tremor: {yes no:314532}   How long has it been going on? ***  At rest or with activation?  ***  When is it noted the most?  ***  Fam hx of tremor?  {yes [id number]  Located where?  ***  Affected by caffeine:  {yes no:314532}  Affected by alcohol:  {yes no:314532}  Affected by stress:  {yes no:314532}  Affected by fatigue:  {yes no:314532}  Spills soup if on spoon:  {yes no:314532}  Spills glass of liquid if full:  {yes no:314532}  Affects ADL's (tying shoes, brushing teeth, etc):  {yes no:314532}  Tremor inducing meds:  {yes no:314532}  Other Specific Symptoms:  Voice: *** Sleep: ***  Vivid Dreams:  {yes no:314532}  Acting out dreams:  {yes no:314532} Wet Pillows: {yes no:314532} Postural symptoms:  {yes no:314532}  Falls?  {yes no:314532} Bradykinesia symptoms: {parkinson brady:18041} Loss of smell:  {yes no:314532} Loss of taste:  {yes no:314532} Urinary Incontinence:  {yes no:314532} Difficulty Swallowing:  {yes no:314532} Handwriting, micrographia: {yes no:314532} Trouble with ADL's:  {yes no:314532}  Trouble buttoning clothing: {yes no:314532} Depression:  {yes no:314532} Memory changes:  {yes no:314532} Hallucinations:  {yes no:314532}  visual distortions: {yes no:314532} N/V:  {yes no:314532} Lightheaded:  {yes no:314532}  Syncope: {yes no:314532} Diplopia:  {yes no:314532} Dyskinesia:  {yes no:314532}  Neuroimaging of the brain has not previously been performed.  It *** available for my review today.  PREVIOUS MEDICATIONS: {Parkinson's RX:18200}  ALLERGIES:  No Known Allergies  CURRENT MEDICATIONS:  Current Outpatient Medications  Medication Instructions  . Accu-Chek FastClix Lancets MISC  CHECK FOR BLOOD SUGAR DAILY  . ammonium lactate (AMLACTIN) 12 % cream No dose, route, or frequency recorded.  . Calcium Carbonate-Vitamin D 600-200 MG-UNIT CAPS Oral  . gabapentin (NEURONTIN) 100 mg, Oral, 2 times daily  . glucose blood test strip Accu-Chek [employer]  . Insulin Aspart Prot & Aspart (NOVOLOG MIX 70/30 [place]) Subcutaneous, 35 units in the mornings and 25 at night  . latanoprost (XALATAN) 0.005 % ophthalmic solution 1 drop, Daily at bedtime  . levothyroxine (SYNTHROID) 75 MCG tablet TAKE ONE TABLET BY MOUTH DAILY  . LORazepam (ATIVAN) 0.5 mg, Oral, Daily PRN  . Melatonin 5 mg, Oral  . Omega-3 Fatty Acids (FISH OIL) 1000 MG CAPS  Every morning - 10a  . pantoprazole (PROTONIX) 40 MG tablet TAKE ONE TABLET BY MOUTH DAILY - NEEDS OFFICE VISIT  . Prednicarbate 0.1 % CREA 2 times daily  . simvastatin (ZOCOR) 20 MG tablet TAKE ONE TABLET BY MOUTH AT BEDTIME - NEEDS OFFICE VISIT  . spironolactone (ALDACTONE) 50 MG tablet TAKE TWO TABLETS BY MOUTH DAILY  . SSD 1 % cream 1 application, Daily  . tiZANidine (ZANAFLEX) 4 MG tablet TAKE ONE TABLET BY MOUTH EVERY NIGHT AT BEDTIME AS NEEDED MUSCLE SPASMS  . triamterene-hydrochlorothiazide (MAXZIDE-25) 37.5-25 MG tablet triamterene 37.5 mg-hydrochlorothiazide 25 mg tablet  . vitamin C 1,000 mg, Daily  . vitamin E 100 UNIT capsule Oral, Daily    PAST MEDICAL HISTORY:   Past Medical History:  Diagnosis Date  . Anemia   . Angiodysplasia of ascending colon 10/25/2014  . Anxiety   . Arthritis   . Borderline diabetes   . Cancer  of the skin, basal cell 09/03/2012  . Cirrhosis ([place])   . Diabetes mellitus without complication ([place])   . Diverticular disease   . 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 GERD (gastroesophageal reflux disease)   . Hypertension   . Iron deficiency anemia due to chronic blood loss 01/19/2015  . Portal hypertensive gastropathy ([place]) 08/02/2016   ? Some GAVE also  . Thyroid disease     PAST  SURGICAL HISTORY:   Past Surgical History:  Procedure Laterality Date  . ABDOMINAL HYSTERECTOMY    . APPENDECTOMY    . COLONOSCOPY    . ESOPHAGOGASTRODUODENOSCOPY    . IR PARACENTESIS  05/25/2016  . LEG SKIN LESION  BIOPSY / EXCISION  12/11/14  . MOHS SURGERY     ankle  . TONSILLECTOMY    . TOTAL HIP ARTHROPLASTY Bilateral 1993, 2006  . UPPER GASTROINTESTINAL ENDOSCOPY  09/09/2017    SOCIAL HISTORY:   Social History   Socioeconomic History  . Marital status: Divorced    Spouse name: Not on file  . Number of children: 2  . Years of education: Not on file  . Highest education level: Not on file  Occupational History  . Occupation: retired  Tobacco Use  . Smoking status: Never Smoker  . Smokeless tobacco: Never Used  . Tobacco comment: NEVER USED TOBACCO  Substance and Sexual Activity  . Alcohol use: No    Alcohol/week: 2.0 standard drinks    Types: 2 Standard drinks or equivalent per week    Comment: rare occassion  . Drug use: No  . Sexual activity: Never  Other Topics Concern  . Not on file  Social History Narrative   The patient is divorced. She is originally from [place]. She has 2 sons. 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   08/10/2014   Social Determinants of Health   Financial Resource Strain:   . Difficulty of Paying Living Expenses: Not on file  Food Insecurity:   . Worried About Charity fundraiser in the Last Year: Not on file  . Ran Out of Food in the Last Year: Not on file  Transportation Needs:   . Lack of Transportation (Medical): Not on file  . Lack of Transportation (Non-Medical): Not on file  Physical Activity:   . Days of Exercise per Week: Not on file  . Minutes of Exercise per Session: Not on file  Stress:   . Feeling of Stress : Not on file  Social Connections:   . Frequency of Communication with Friends and Family: Not on file  . Frequency of Social Gatherings with Friends and Family: Not on file  .  Attends Religious Services: Not on file  . Active Member of Clubs or Organizations: Not on file  . Attends Archivist Meetings: Not on file  . Marital Status: Not on file  Intimate Partner Violence:   . Fear of Current or Ex-Partner: Not on file  . Emotionally Abused: Not on file  . Physically Abused: Not on file  . Sexually Abused: Not on file    FAMILY HISTORY:   Family Status  Relation Name Status  . Father  Deceased  . Mother  Deceased  . Neg Hx  (Not Specified)    ROS:  ROS  PHYSICAL EXAMINATION:    VITALS:  There were no vitals filed for this visit.  GEN:  The patient appears stated age and is in NAD. HEENT:  Normocephalic, atraumatic.  The mucous membranes are moist. The superficial temporal arteries are without ropiness or tenderness. CV:  RRR Lungs:  CTAB Neck/HEME:  There are no carotid bruits bilaterally.  Neurological examination:  Orientation: The patient is alert and oriented x3. Fund of knowledge is appropriate.  Recent and remote memory are intact.  Attention and concentration are normal.    Able to name objects and repeat phrases. Cranial nerves: There is good facial symmetry. Pupils are equal round and reactive to light bilaterally. Fundoscopic exam reveals clear margins bilaterally. Extraocular muscles are intact. The visual fields are full to confrontational testing. The speech is fluent and clear. Soft palate rises symmetrically and there is no tongue deviation. Hearing is intact to conversational tone. Sensation: Sensation is intact to light and pinprick throughout (facial, trunk, extremities). Vibration is intact at the bilateral big toe. There is no extinction with double simultaneous stimulation. There is no sensory dermatomal level identified. Motor: Strength is 5/5 in the bilateral upper and lower extremities.   Shoulder shrug is equal and symmetric.  There is no pronator drift. Deep tendon reflexes: Deep tendon reflexes are 2/4 at the  bilateral biceps, triceps, brachioradialis, patella and achilles. Plantar responses are downgoing bilaterally.  Movement examination: Tone: There is ***tone in the bilateral upper extremities.  The tone in the lower extremities is ***.  Abnormal movements: *** Coordination:  There is *** decremation with RAM's, *** Gait and Station: The patient has *** difficulty arising out of a deep-seated chair without the use of the hands. The patient's stride length is ***.  The patient has a *** pull test.        Chemistry      Component Value Date/Time   NA 135 12/08/2018 1504   NA 138 12/18/2016 1314   NA 139 03/05/2016 1139   K 4.6 12/08/2018 1504   K 4.3 12/18/2016 1314   K 3.6 03/05/2016 1139   CL 103 12/08/2018 1504   CL 101 12/18/2016 1314   CO2 24 12/08/2018 1504   CO2 28 12/18/2016 1314   CO2 26 03/05/2016 1139   BUN 28 (H) 12/08/2018 1504   BUN 33 (H) 12/18/2016 1314   BUN 14.3 03/05/2016 1139   CREATININE 1.09 (H) 12/08/2018 1504   CREATININE 1.06 (H) 02/14/2018 1544   CREATININE 0.7 03/05/2016 1139      Component Value Date/Time   CALCIUM 10.2 12/08/2018 1504   CALCIUM 10.0 12/18/2016 1314   CALCIUM 9.3 03/05/2016 1139   ALKPHOS 114 12/08/2018 1504   ALKPHOS 94 (H) 12/18/2016 1314   ALKPHOS 127 03/05/2016 1139   AST 33 12/08/2018 1504   AST 29 03/05/2016 1139   ALT 30 12/08/2018 1504   ALT 26 12/18/2016 1314   ALT 22 03/05/2016 1139   BILITOT 1.7 (H) 12/08/2018 1504   BILITOT 1.75 (H) 03/05/2016 1139     Lab Results  Component Value Date   TSH 1.78 10/27/2018     ASSESSMENT/PLAN:  ***  Cc:  Ann Held, DO

## 2019-01-26 ENCOUNTER — Ambulatory Visit: Payer: Medicare HMO | Admitting: Neurology

## 2019-02-06 ENCOUNTER — Inpatient Hospital Stay: Payer: HMO

## 2019-02-06 ENCOUNTER — Inpatient Hospital Stay: Payer: HMO | Admitting: Family

## 2019-02-11 ENCOUNTER — Inpatient Hospital Stay: Payer: HMO | Attending: Hematology & Oncology

## 2019-02-11 ENCOUNTER — Telehealth: Payer: Self-pay | Admitting: *Deleted

## 2019-02-11 ENCOUNTER — Inpatient Hospital Stay (HOSPITAL_BASED_OUTPATIENT_CLINIC_OR_DEPARTMENT_OTHER): Payer: HMO | Admitting: Family

## 2019-02-11 ENCOUNTER — Telehealth: Payer: Self-pay | Admitting: Family

## 2019-02-11 ENCOUNTER — Other Ambulatory Visit: Payer: Self-pay | Admitting: Family Medicine

## 2019-02-11 ENCOUNTER — Encounter: Payer: Self-pay | Admitting: Family

## 2019-02-11 ENCOUNTER — Other Ambulatory Visit: Payer: Self-pay

## 2019-02-11 VITALS — BP 134/65 | HR 108 | Temp 96.9°F | Resp 17 | Ht 65.0 in | Wt 166.8 lb

## 2019-02-11 DIAGNOSIS — G629 Polyneuropathy, unspecified: Secondary | ICD-10-CM | POA: Diagnosis not present

## 2019-02-11 DIAGNOSIS — R195 Other fecal abnormalities: Secondary | ICD-10-CM

## 2019-02-11 DIAGNOSIS — D500 Iron deficiency anemia secondary to blood loss (chronic): Secondary | ICD-10-CM

## 2019-02-11 DIAGNOSIS — D649 Anemia, unspecified: Secondary | ICD-10-CM

## 2019-02-11 DIAGNOSIS — Z79899 Other long term (current) drug therapy: Secondary | ICD-10-CM | POA: Diagnosis not present

## 2019-02-11 DIAGNOSIS — K7460 Unspecified cirrhosis of liver: Secondary | ICD-10-CM | POA: Diagnosis not present

## 2019-02-11 DIAGNOSIS — D6959 Other secondary thrombocytopenia: Secondary | ICD-10-CM | POA: Insufficient documentation

## 2019-02-11 DIAGNOSIS — R161 Splenomegaly, not elsewhere classified: Secondary | ICD-10-CM | POA: Insufficient documentation

## 2019-02-11 DIAGNOSIS — R252 Cramp and spasm: Secondary | ICD-10-CM | POA: Diagnosis not present

## 2019-02-11 DIAGNOSIS — K922 Gastrointestinal hemorrhage, unspecified: Secondary | ICD-10-CM | POA: Insufficient documentation

## 2019-02-11 LAB — CBC WITH DIFFERENTIAL (CANCER CENTER ONLY)
Abs Immature Granulocytes: 0.04 10*3/uL (ref 0.00–0.07)
Basophils Absolute: 0 10*3/uL (ref 0.0–0.1)
Basophils Relative: 1 %
Eosinophils Absolute: 0.1 10*3/uL (ref 0.0–0.5)
Eosinophils Relative: 2 %
HCT: 34.4 % — ABNORMAL LOW (ref 36.0–46.0)
Hemoglobin: 10.7 g/dL — ABNORMAL LOW (ref 12.0–15.0)
Immature Granulocytes: 1 %
Lymphocytes Relative: 15 %
Lymphs Abs: 0.7 10*3/uL (ref 0.7–4.0)
MCH: 28.5 pg (ref 26.0–34.0)
MCHC: 31.1 g/dL (ref 30.0–36.0)
MCV: 91.5 fL (ref 80.0–100.0)
Monocytes Absolute: 0.4 10*3/uL (ref 0.1–1.0)
Monocytes Relative: 8 %
Neutro Abs: 3.4 10*3/uL (ref 1.7–7.7)
Neutrophils Relative %: 73 %
Platelet Count: 72 10*3/uL — ABNORMAL LOW (ref 150–400)
RBC: 3.76 MIL/uL — ABNORMAL LOW (ref 3.87–5.11)
RDW: 14.4 % (ref 11.5–15.5)
WBC Count: 4.6 10*3/uL (ref 4.0–10.5)
nRBC: 0 % (ref 0.0–0.2)

## 2019-02-11 LAB — CMP (CANCER CENTER ONLY)
ALT: 32 U/L (ref 0–44)
AST: 42 U/L — ABNORMAL HIGH (ref 15–41)
Albumin: 3.9 g/dL (ref 3.5–5.0)
Alkaline Phosphatase: 130 U/L — ABNORMAL HIGH (ref 38–126)
Anion gap: 6 (ref 5–15)
BUN: 20 mg/dL (ref 8–23)
CO2: 27 mmol/L (ref 22–32)
Calcium: 10.1 mg/dL (ref 8.9–10.3)
Chloride: 102 mmol/L (ref 98–111)
Creatinine: 0.97 mg/dL (ref 0.44–1.00)
GFR, Est AFR Am: >60 mL/min (ref >60)
GFR, Estimated: 54 mL/min — ABNORMAL LOW (ref >60)
Glucose, Bld: 372 mg/dL — ABNORMAL HIGH (ref 70–99)
Potassium: 4.6 mmol/L (ref 3.5–5.1)
Sodium: 135 mmol/L (ref 135–145)
Total Bilirubin: 1.5 mg/dL — ABNORMAL HIGH (ref 0.3–1.2)
Total Protein: 6.3 g/dL — ABNORMAL LOW (ref 6.5–8.1)

## 2019-02-11 LAB — RETICULOCYTES
Immature Retic Fract: 14.8 % (ref 2.3–15.9)
RBC.: 3.68 MIL/uL — ABNORMAL LOW (ref 3.87–5.11)
Retic Count, Absolute: 104.5 10*3/uL (ref 19.0–186.0)
Retic Ct Pct: 2.8 % (ref 0.4–3.1)

## 2019-02-11 LAB — SAMPLE TO BLOOD BANK

## 2019-02-11 NOTE — Telephone Encounter (Signed)
Electrolytes normal  She can try calcium mag supplement at night Tonic water has quinine in it --- that can help A homeopathic med called Hylands for leg cramps can help as well

## 2019-02-11 NOTE — Telephone Encounter (Signed)
Appointments scheduled calendar printed per 1/20 los

## 2019-02-11 NOTE — Telephone Encounter (Signed)
Spoke with patient and she stated that she is having cramps at night in her calves most nights.  They get as "hard as a rock".  No swelling, just cramps.  Labs are back from oncology.

## 2019-02-11 NOTE — Telephone Encounter (Signed)
-----   Message from Ann Held, DO sent at 02/11/2019  2:10 PM EST ----- The pt was upstairs seeing oncology and complained to sarah about leg cramps----- would you be able to get more details from her?   Does it sound like restless legs or just cramps?   We may need visit tomorrow or Friday to check cmp, cbc etc--- if not done today

## 2019-02-11 NOTE — Telephone Encounter (Signed)
Left message on machine for patient to call back.

## 2019-02-11 NOTE — Progress Notes (Signed)
Hematology and Oncology Follow Up Visit  [name] [id number] 03/06/1932 86 y.o. 02/11/2019   Principle Diagnosis:  Chronic thrombocytopenia secondary to cirrhosis and splenomegaly Iron deficiency anemia secondary to GI bleed  Current Therapy:   IV iron as indicated Red blood cell transfusion as needed for variceal bleeding   Interim History:  [name] is here today for follow-up. She is doing well but is still having severe muscle cramps in her legs at night. She states that this brings her to tears at times. I will check with her PCP and see how she would like to treat this.  She has not noted any blood loss. No bruising or petechiae.  No fever, chills, n/v, cough, rash, dizziness, SOB, chest pain, palpitations, abdominal pain or changes in bowel or bladder habits.  No swelling or tenderness in her extremities at this time.  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  No falls or syncopal episodes to report.  She states that she is eating well and staying hydrated. Her weight is stable.   ECOG Performance Status: 1 - Symptomatic but completely ambulatory  Medications:  Allergies as of 02/11/2019   No Known Allergies     Medication List       Accurate as of February 11, 2019  1:53 PM. If you have any questions, ask your nurse or doctor.        Accu-Chek FastClix Lancets Misc CHECK FOR BLOOD SUGAR DAILY   ammonium lactate 12 % cream Commonly known as: AMLACTIN   Calcium Carbonate-Vitamin D 600-200 MG-UNIT Caps Take by mouth.   Fish Oil 1000 MG Caps Take by mouth every morning.   gabapentin 100 MG capsule Commonly known as: NEURONTIN Take 1 capsule (100 mg total) by mouth 2 (two) times daily.   glucose blood test strip Accu-Chek Fastclix Lancet Drum   latanoprost 0.005 % ophthalmic solution Commonly known as: XALATAN Place 1 drop into both eyes at bedtime.   levothyroxine 75 MCG tablet Commonly known as: SYNTHROID TAKE ONE TABLET BY MOUTH DAILY    LORazepam 0.5 MG tablet Commonly known as: ATIVAN Take 1 tablet (0.5 mg total) by mouth daily as needed.   Melatonin 5 MG Caps Take 5 mg by mouth.   NOVOLOG MIX 70/30 [place] Inject into the skin. 35 units in the mornings and 25 at night   pantoprazole 40 MG tablet Commonly known as: PROTONIX TAKE ONE TABLET BY MOUTH DAILY - NEEDS OFFICE VISIT   Prednicarbate 0.1 % Crea Apply topically 2 (two) times daily.   simvastatin 20 MG tablet Commonly known as: ZOCOR TAKE ONE TABLET BY MOUTH AT BEDTIME - NEEDS OFFICE VISIT   spironolactone 50 MG tablet Commonly known as: ALDACTONE TAKE TWO TABLETS BY MOUTH DAILY   SSD 1 % cream Generic drug: silver sulfADIAZINE 1 application daily.   tiZANidine 4 MG tablet Commonly known as: ZANAFLEX TAKE ONE TABLET BY MOUTH EVERY NIGHT AT BEDTIME AS NEEDED MUSCLE SPASMS   triamterene-hydrochlorothiazide 37.5-25 MG tablet Commonly known as: MAXZIDE-25 triamterene 37.5 mg-hydrochlorothiazide 25 mg tablet   vitamin C 1000 MG tablet Take 1,000 mg by mouth daily.   vitamin E 45 MG (100 UNITS) capsule Take by mouth daily.       Allergies: No Known Allergies  Past Medical History, Surgical history, Social history, and Family History were reviewed and updated.  Review of Systems: All other 10 point review of systems is negative.   Physical Exam:  vitals were not taken for this visit.  Wt Readings from Last 3 Encounters:  12/08/18 164 lb 8 oz (74.6 kg)  11/28/18 159 lb (72.1 kg)  11/03/18 168 lb 3.2 oz (76.3 kg)    Ocular: Sclerae unicteric, pupils equal, round and reactive to light Ear-nose-throat: Oropharynx clear, dentition fair Lymphatic: No cervical or supraclavicular adenopathy Lungs no rales or rhonchi, good excursion bilaterally Heart regular rate and rhythm, no murmur appreciated Abd soft, nontender, positive bowel sounds, no liver or spleen tip palpated on exam, no fluid wave  MSK no focal spinal tenderness, no joint  edema Neuro: non-focal, well-oriented, appropriate affect Breasts: Deferred   Lab Results  Component Value Date   WBC 7.0 12/08/2018   HGB 10.9 (L) 12/08/2018   HCT 33.8 (L) 12/08/2018   MCV 91.6 12/08/2018   PLT 66 (L) 12/08/2018   Lab Results  Component Value Date   FERRITIN 35 12/08/2018   IRON 49 12/08/2018   TIBC 343 12/08/2018   UIBC 294 12/08/2018   IRONPCTSAT 14 (L) 12/08/2018   Lab Results  Component Value Date   RETICCTPCT 2.7 12/08/2018   RBC 3.69 (L) 12/08/2018   RBC 3.69 (L) 12/08/2018   No results found for: KPAFRELGTCHN, LAMBDASER, KAPLAMBRATIO No results found for: IGGSERUM, IGA, IGMSERUM No results found for: Odetta Pink, SPEI   Chemistry      Component Value Date/Time   NA 135 12/08/2018 1504   NA 138 12/18/2016 1314   NA 139 03/05/2016 1139   K 4.6 12/08/2018 1504   K 4.3 12/18/2016 1314   K 3.6 03/05/2016 1139   CL 103 12/08/2018 1504   CL 101 12/18/2016 1314   CO2 24 12/08/2018 1504   CO2 28 12/18/2016 1314   CO2 26 03/05/2016 1139   BUN 28 (H) 12/08/2018 1504   BUN 33 (H) 12/18/2016 1314   BUN 14.3 03/05/2016 1139   CREATININE 1.09 (H) 12/08/2018 1504   CREATININE 1.06 (H) 02/14/2018 1544   CREATININE 0.7 03/05/2016 1139      Component Value Date/Time   CALCIUM 10.2 12/08/2018 1504   CALCIUM 10.0 12/18/2016 1314   CALCIUM 9.3 03/05/2016 1139   ALKPHOS 114 12/08/2018 1504   ALKPHOS 94 (H) 12/18/2016 1314   ALKPHOS 127 03/05/2016 1139   AST 33 12/08/2018 1504   AST 29 03/05/2016 1139   ALT 30 12/08/2018 1504   ALT 26 12/18/2016 1314   ALT 22 03/05/2016 1139   BILITOT 1.7 (H) 12/08/2018 1504   BILITOT 1.75 (H) 03/05/2016 1139       Impression and Plan: [name] is a very pleasant71 yo female with chronic thrombocytopenia and iron deficiency anemia secondary to intermittent GI blood loss. We will see what her iron studies show and bring her back in for infusion if needed. No  blood needed at this time.  We will plan to see her back in another 8 weeks.  She will contact our office with any questions or concerns. We can certainly see her sooner if needed.   [name], NP 1/20/20211:53 PM

## 2019-02-11 NOTE — Telephone Encounter (Signed)
Patient notified

## 2019-02-12 ENCOUNTER — Telehealth: Payer: Self-pay | Admitting: Family

## 2019-02-12 LAB — IRON AND TIBC
Iron: 65 ug/dL (ref 41–142)
Saturation Ratios: 20 % — ABNORMAL LOW (ref 21–57)
TIBC: 328 ug/dL (ref 236–444)
UIBC: 262 ug/dL (ref 120–384)

## 2019-02-12 LAB — FERRITIN: Ferritin: 20 ng/mL (ref 11–307)

## 2019-02-12 NOTE — Telephone Encounter (Signed)
Called and spoke with patient about adding 5 infusion appts to her schedule per 1/21 sch msg.  She was ok with all dates/times scheduled

## 2019-02-18 ENCOUNTER — Other Ambulatory Visit: Payer: Self-pay

## 2019-02-18 ENCOUNTER — Inpatient Hospital Stay: Payer: HMO

## 2019-02-18 VITALS — BP 127/62 | HR 99 | Temp 97.1°F | Resp 16

## 2019-02-18 DIAGNOSIS — D6959 Other secondary thrombocytopenia: Secondary | ICD-10-CM | POA: Diagnosis not present

## 2019-02-18 DIAGNOSIS — R195 Other fecal abnormalities: Secondary | ICD-10-CM

## 2019-02-18 DIAGNOSIS — K5520 Angiodysplasia of colon without hemorrhage: Secondary | ICD-10-CM

## 2019-02-18 DIAGNOSIS — K219 Gastro-esophageal reflux disease without esophagitis: Secondary | ICD-10-CM

## 2019-02-18 DIAGNOSIS — D500 Iron deficiency anemia secondary to blood loss (chronic): Secondary | ICD-10-CM

## 2019-02-18 MED ORDER — SODIUM CHLORIDE 0.9 % IV SOLN
200.0000 mg | Freq: Once | INTRAVENOUS | Status: AC
  Administered 2019-02-18: 200 mg via INTRAVENOUS
  Filled 2019-02-18: qty 200

## 2019-02-18 MED ORDER — SODIUM CHLORIDE 0.9 % IV SOLN
INTRAVENOUS | Status: DC
  Filled 2019-02-18: qty 250

## 2019-02-18 NOTE — Patient Instructions (Signed)

## 2019-02-19 ENCOUNTER — Telehealth: Payer: Self-pay

## 2019-02-19 NOTE — Telephone Encounter (Signed)
Courtney from [facility] [employer] called in needing information on this patient as far as diagnose's of heart failure or diabetes please give Loma Sousa a call back at [phone] thanks.

## 2019-02-23 ENCOUNTER — Ambulatory Visit: Payer: Medicare HMO | Admitting: Neurology

## 2019-02-24 ENCOUNTER — Other Ambulatory Visit: Payer: Self-pay

## 2019-02-24 ENCOUNTER — Inpatient Hospital Stay: Payer: HMO | Attending: Hematology & Oncology

## 2019-02-24 VITALS — BP 134/69 | HR 104 | Temp 97.6°F | Resp 18

## 2019-02-24 DIAGNOSIS — R195 Other fecal abnormalities: Secondary | ICD-10-CM

## 2019-02-24 DIAGNOSIS — D500 Iron deficiency anemia secondary to blood loss (chronic): Secondary | ICD-10-CM

## 2019-02-24 DIAGNOSIS — K5520 Angiodysplasia of colon without hemorrhage: Secondary | ICD-10-CM

## 2019-02-24 DIAGNOSIS — D6959 Other secondary thrombocytopenia: Secondary | ICD-10-CM | POA: Diagnosis not present

## 2019-02-24 DIAGNOSIS — Z8719 Personal history of other diseases of the digestive system: Secondary | ICD-10-CM | POA: Diagnosis not present

## 2019-02-24 DIAGNOSIS — K219 Gastro-esophageal reflux disease without esophagitis: Secondary | ICD-10-CM

## 2019-02-24 MED ORDER — SODIUM CHLORIDE 0.9 % IV SOLN
INTRAVENOUS | Status: DC
  Filled 2019-02-24: qty 250

## 2019-02-24 MED ORDER — SODIUM CHLORIDE 0.9 % IV SOLN
200.0000 mg | Freq: Once | INTRAVENOUS | Status: AC
  Administered 2019-02-24: 200 mg via INTRAVENOUS
  Filled 2019-02-24: qty 200

## 2019-02-24 NOTE — Patient Instructions (Signed)

## 2019-02-25 NOTE — Telephone Encounter (Signed)
Called back and confirmed diabetes.

## 2019-02-26 ENCOUNTER — Inpatient Hospital Stay: Payer: HMO

## 2019-02-26 ENCOUNTER — Other Ambulatory Visit: Payer: Self-pay

## 2019-02-26 VITALS — BP 135/73 | HR 96 | Temp 97.1°F | Resp 17

## 2019-02-26 DIAGNOSIS — D500 Iron deficiency anemia secondary to blood loss (chronic): Secondary | ICD-10-CM | POA: Diagnosis not present

## 2019-02-26 DIAGNOSIS — R195 Other fecal abnormalities: Secondary | ICD-10-CM

## 2019-02-26 DIAGNOSIS — K219 Gastro-esophageal reflux disease without esophagitis: Secondary | ICD-10-CM

## 2019-02-26 DIAGNOSIS — K5520 Angiodysplasia of colon without hemorrhage: Secondary | ICD-10-CM

## 2019-02-26 MED ORDER — SODIUM CHLORIDE 0.9 % IV SOLN
Freq: Once | INTRAVENOUS | Status: AC
  Filled 2019-02-26: qty 250

## 2019-02-26 MED ORDER — SODIUM CHLORIDE 0.9 % IV SOLN
200.0000 mg | Freq: Once | INTRAVENOUS | Status: AC
  Administered 2019-02-26: 200 mg via INTRAVENOUS
  Filled 2019-02-26: qty 200

## 2019-02-26 NOTE — Patient Instructions (Signed)

## 2019-03-02 ENCOUNTER — Other Ambulatory Visit: Payer: Self-pay

## 2019-03-02 ENCOUNTER — Inpatient Hospital Stay: Payer: HMO

## 2019-03-02 VITALS — BP 138/61 | HR 84 | Temp 97.3°F | Resp 17

## 2019-03-02 DIAGNOSIS — D500 Iron deficiency anemia secondary to blood loss (chronic): Secondary | ICD-10-CM

## 2019-03-02 DIAGNOSIS — K5520 Angiodysplasia of colon without hemorrhage: Secondary | ICD-10-CM

## 2019-03-02 DIAGNOSIS — K219 Gastro-esophageal reflux disease without esophagitis: Secondary | ICD-10-CM

## 2019-03-02 DIAGNOSIS — R195 Other fecal abnormalities: Secondary | ICD-10-CM

## 2019-03-02 MED ORDER — SODIUM CHLORIDE 0.9 % IV SOLN
200.0000 mg | Freq: Once | INTRAVENOUS | Status: AC
  Administered 2019-03-02: 200 mg via INTRAVENOUS
  Filled 2019-03-02: qty 200

## 2019-03-02 NOTE — Patient Instructions (Signed)

## 2019-03-04 ENCOUNTER — Other Ambulatory Visit: Payer: Self-pay

## 2019-03-04 ENCOUNTER — Inpatient Hospital Stay: Payer: HMO

## 2019-03-04 VITALS — BP 138/67 | HR 101 | Temp 96.9°F | Resp 16

## 2019-03-04 DIAGNOSIS — D500 Iron deficiency anemia secondary to blood loss (chronic): Secondary | ICD-10-CM

## 2019-03-04 DIAGNOSIS — R195 Other fecal abnormalities: Secondary | ICD-10-CM

## 2019-03-04 DIAGNOSIS — K219 Gastro-esophageal reflux disease without esophagitis: Secondary | ICD-10-CM

## 2019-03-04 DIAGNOSIS — K5520 Angiodysplasia of colon without hemorrhage: Secondary | ICD-10-CM

## 2019-03-04 MED ORDER — SODIUM CHLORIDE 0.9 % IV SOLN
200.0000 mg | Freq: Once | INTRAVENOUS | Status: AC
  Administered 2019-03-04: 200 mg via INTRAVENOUS
  Filled 2019-03-04: qty 200

## 2019-03-04 MED ORDER — SODIUM CHLORIDE 0.9 % IV SOLN
INTRAVENOUS | Status: DC
  Filled 2019-03-04: qty 250

## 2019-03-04 NOTE — Patient Instructions (Signed)

## 2019-03-14 NOTE — Progress Notes (Deleted)
Jessica Dennis was seen today in the movement disorders clinic for neurologic consultation at the request of Ann Held, *.  The consultation is for the evaluation of tremor.  Outside records that were made available to me were reviewed.  Pts referral was made in October (had appt in October but arrived after appt time and not seen and no showed her Jan appt time).  Pt is an 86 y/o female with a hx of iron def anemia, splenomegaly, alcoholism who presents with c/o R hand tremor.  Tremor: {yes no:314532}   How long has it been going on? ***  At rest or with activation?  ***  When is it noted the most?  ***  Fam hx of tremor?  {yes [id number]  Located where?  ***  Affected by caffeine:  {yes no:314532}  Affected by alcohol:  {yes no:314532}  Affected by stress:  {yes no:314532}  Affected by fatigue:  {yes no:314532}  Spills soup if on spoon:  {yes no:314532}  Spills glass of liquid if full:  {yes no:314532}  Affects ADL's (tying shoes, brushing teeth, etc):  {yes no:314532}  Tremor inducing meds:  {yes no:314532}  Other Specific Symptoms:  Voice: *** Sleep: ***  Vivid Dreams:  {yes no:314532}  Acting out dreams:  {yes no:314532} Wet Pillows: {yes no:314532} Postural symptoms:  {yes no:314532}  Falls?  {yes no:314532} Bradykinesia symptoms: {parkinson brady:18041} Loss of smell:  {yes no:314532} Loss of taste:  {yes no:314532} Urinary Incontinence:  {yes no:314532} Difficulty Swallowing:  {yes no:314532} Handwriting, micrographia: {yes no:314532} Trouble with ADL's:  {yes no:314532}  Trouble buttoning clothing: {yes no:314532} Depression:  {yes no:314532} Memory changes:  {yes no:314532} Hallucinations:  {yes no:314532}  visual distortions: {yes no:314532} N/V:  {yes no:314532} Lightheaded:  {yes no:314532}  Syncope: {yes no:314532} Diplopia:  {yes no:314532} Dyskinesia:  {yes no:314532}  Neuroimaging of the brain has not previously been performed.  It ***  available for my review today.  PREVIOUS MEDICATIONS: {Parkinson's RX:18200}  ALLERGIES:  No Known Allergies  CURRENT MEDICATIONS:  Current Outpatient Medications  Medication Instructions  . Accu-Chek FastClix Lancets MISC CHECK FOR BLOOD SUGAR DAILY  . alclomethasone (ACLOVATE) 0.05 % cream No dose, route, or frequency recorded.  Marland Kitchen ammonium lactate (AMLACTIN) 12 % cream No dose, route, or frequency recorded.  . Calcium Carbonate-Vitamin D 600-200 MG-UNIT CAPS Oral  . gabapentin (NEURONTIN) 100 mg, Oral, 2 times daily  . glucose blood test strip Accu-Chek [employer]  . Insulin Aspart Prot & Aspart (NOVOLOG MIX 70/30 [place]) Subcutaneous, 35 units in the mornings and 25 at night  . latanoprost (XALATAN) 0.005 % ophthalmic solution 1 drop, Daily at bedtime  . levothyroxine (SYNTHROID) 75 MCG tablet TAKE ONE TABLET BY MOUTH DAILY  . LORazepam (ATIVAN) 0.5 mg, Oral, Daily PRN  . Melatonin 5 mg, Oral  . Omega-3 Fatty Acids (FISH OIL) 1000 MG CAPS  Every morning - 10a  . pantoprazole (PROTONIX) 40 MG tablet TAKE ONE TABLET BY MOUTH DAILY - NEEDS OFFICE VISIT  . potassium chloride SA (KLOR-CON) 20 MEQ tablet potassium chloride ER 20 mEq tablet,extended release  . Prednicarbate 0.1 % CREA 2 times daily  . simvastatin (ZOCOR) 20 MG tablet TAKE ONE TABLET BY MOUTH AT BEDTIME - NEEDS OFFICE VISIT  . spironolactone (ALDACTONE) 50 MG tablet TAKE TWO TABLETS BY MOUTH DAILY  . SSD 1 % cream 1 application, Daily  . tiZANidine (ZANAFLEX) 4 MG tablet TAKE ONE TABLET BY MOUTH EVERY NIGHT AT BEDTIME AS  NEEDED MUSCLE SPASMS  . triamterene-hydrochlorothiazide (MAXZIDE-25) 37.5-25 MG tablet triamterene 37.5 mg-hydrochlorothiazide 25 mg tablet  . vitamin C 1,000 mg, Daily  . vitamin E 100 UNIT capsule Oral, Daily    PHYSICAL EXAMINATION:    VITALS:  There were no vitals filed for this visit.  GEN:  The patient appears stated age and is in NAD. HEENT:  Normocephalic, atraumatic.  The mucous  membranes are moist. The superficial temporal arteries are without ropiness or tenderness. CV:  RRR Lungs:  CTAB Neck/HEME:  There are no carotid bruits bilaterally.  Neurological examination:  Orientation: The patient is alert and oriented x3.  Cranial nerves: There is good facial symmetry.  Extraocular muscles are intact. The visual fields are full to confrontational testing. The speech is fluent and clear. Soft palate rises symmetrically and there is no tongue deviation. Hearing is intact to conversational tone. Sensation: Sensation is intact to light touch throughout (facial, trunk, extremities). Vibration is intact at the bilateral big toe. There is no extinction with double simultaneous stimulation.  Motor: Strength is 5/5 in the bilateral upper and lower extremities.   Shoulder shrug is equal and symmetric.  There is no pronator drift. Deep tendon reflexes: Deep tendon reflexes are 2/4 at the bilateral biceps, triceps, brachioradialis, patella and achilles. Plantar responses are downgoing bilaterally.  Movement examination: Tone: There is ***tone in the bilateral upper extremities.  The tone in the lower extremities is ***.  Abnormal movements: *** Coordination:  There is *** decremation with RAM's, *** Gait and Station: The patient has *** difficulty arising out of a deep-seated chair without the use of the hands. The patient's stride length is ***.  The patient has a *** pull test.      Lab Results  Component Value Date   TSH 1.78 10/27/2018     Chemistry      Component Value Date/Time   NA 135 02/11/2019 1341   NA 138 12/18/2016 1314   NA 139 03/05/2016 1139   K 4.6 02/11/2019 1341   K 4.3 12/18/2016 1314   K 3.6 03/05/2016 1139   CL 102 02/11/2019 1341   CL 101 12/18/2016 1314   CO2 27 02/11/2019 1341   CO2 28 12/18/2016 1314   CO2 26 03/05/2016 1139   BUN 20 02/11/2019 1341   BUN 33 (H) 12/18/2016 1314   BUN 14.3 03/05/2016 1139   CREATININE 0.97 02/11/2019 1341    CREATININE 1.06 (H) 02/14/2018 1544   CREATININE 0.7 03/05/2016 1139      Component Value Date/Time   CALCIUM 10.1 02/11/2019 1341   CALCIUM 10.0 12/18/2016 1314   CALCIUM 9.3 03/05/2016 1139   ALKPHOS 130 (H) 02/11/2019 1341   ALKPHOS 94 (H) 12/18/2016 1314   ALKPHOS 127 03/05/2016 1139   AST 42 (H) 02/11/2019 1341   AST 29 03/05/2016 1139   ALT 32 02/11/2019 1341   ALT 26 12/18/2016 1314   ALT 22 03/05/2016 1139   BILITOT 1.5 (H) 02/11/2019 1341   BILITOT 1.75 (H) 03/05/2016 1139       ASSESSMENT/PLAN:  ***  Total time spent on today's visit was ***greater than 60 minutes, including both face-to-face time and nonface-to-face time.  Time included that spent on review of records (prior notes available to me/labs/imaging if pertinent), discussing treatment and goals, answering patient's questions and coordinating care.  Cc:  Ann Held, DO

## 2019-03-17 ENCOUNTER — Ambulatory Visit: Payer: HMO | Admitting: Neurology

## 2019-03-21 ENCOUNTER — Other Ambulatory Visit: Payer: Self-pay | Admitting: Internal Medicine

## 2019-03-22 IMAGING — DX DG SHOULDER 2+V*R*
3 series · 3 of 3 positions shown · non-contrast
Comparison: None.

CLINICAL DATA: Right shoulder pain for several months

EXAM:
RIGHT SHOULDER - 2+ VIEW

[shoulder grashey]
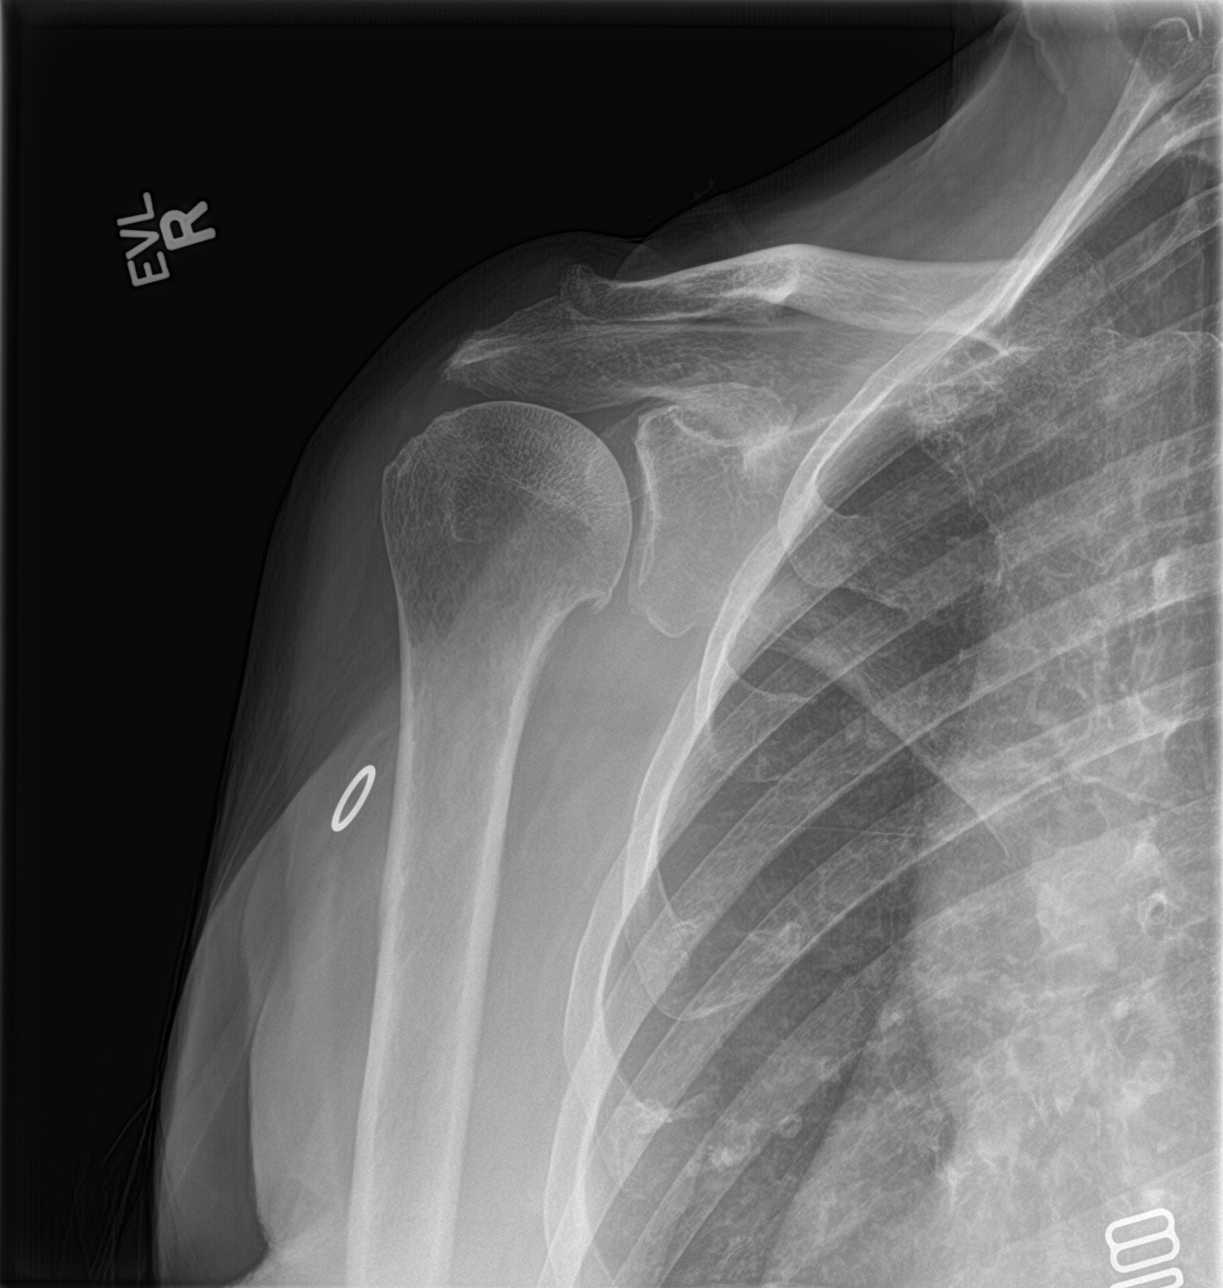

[shoulder y view]
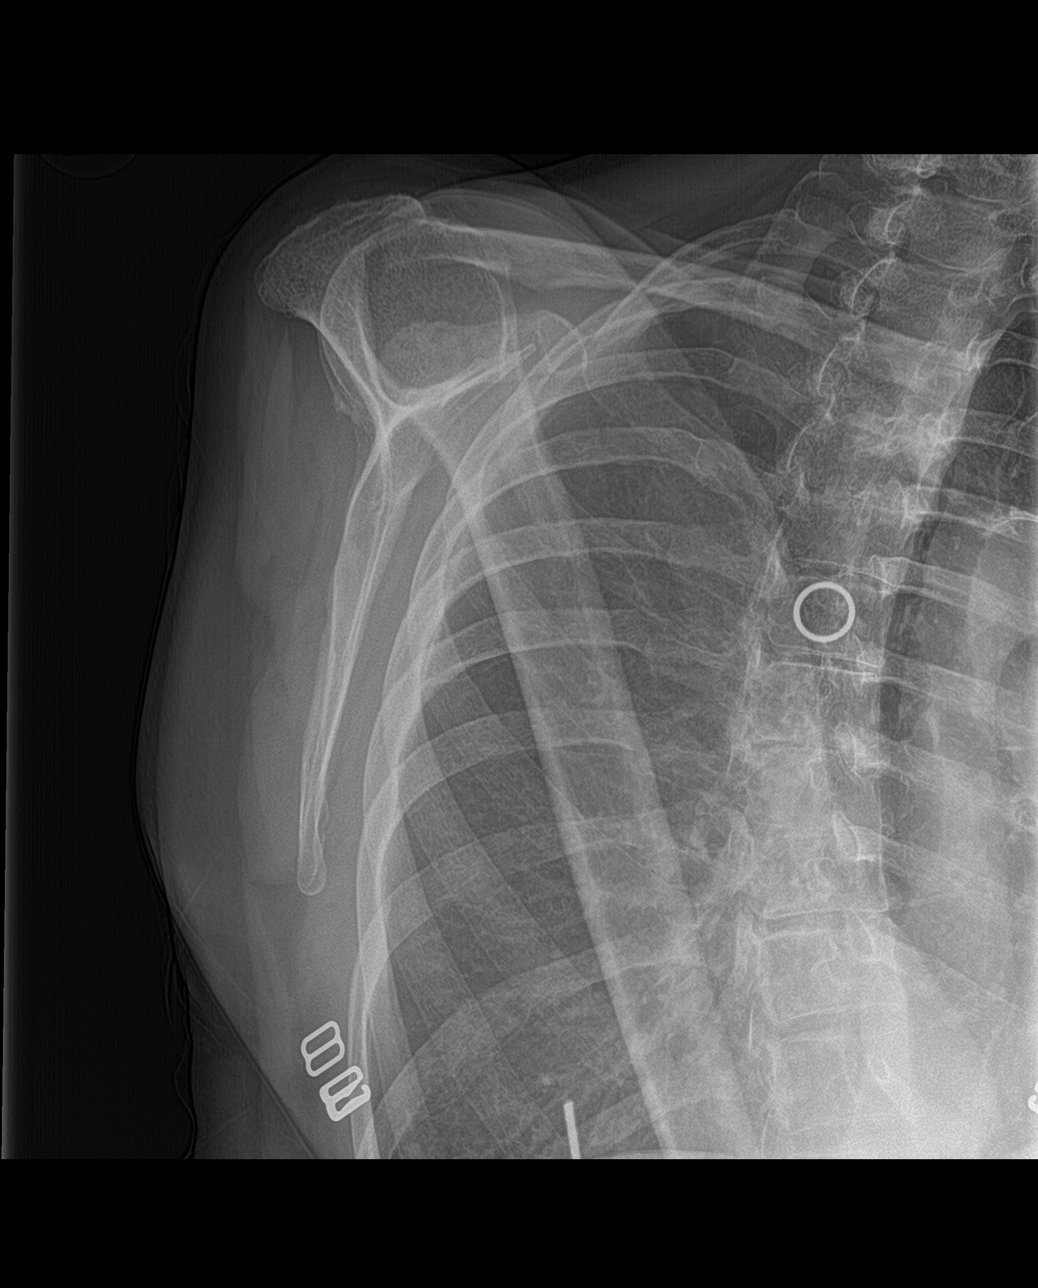

[shoulder axillary]
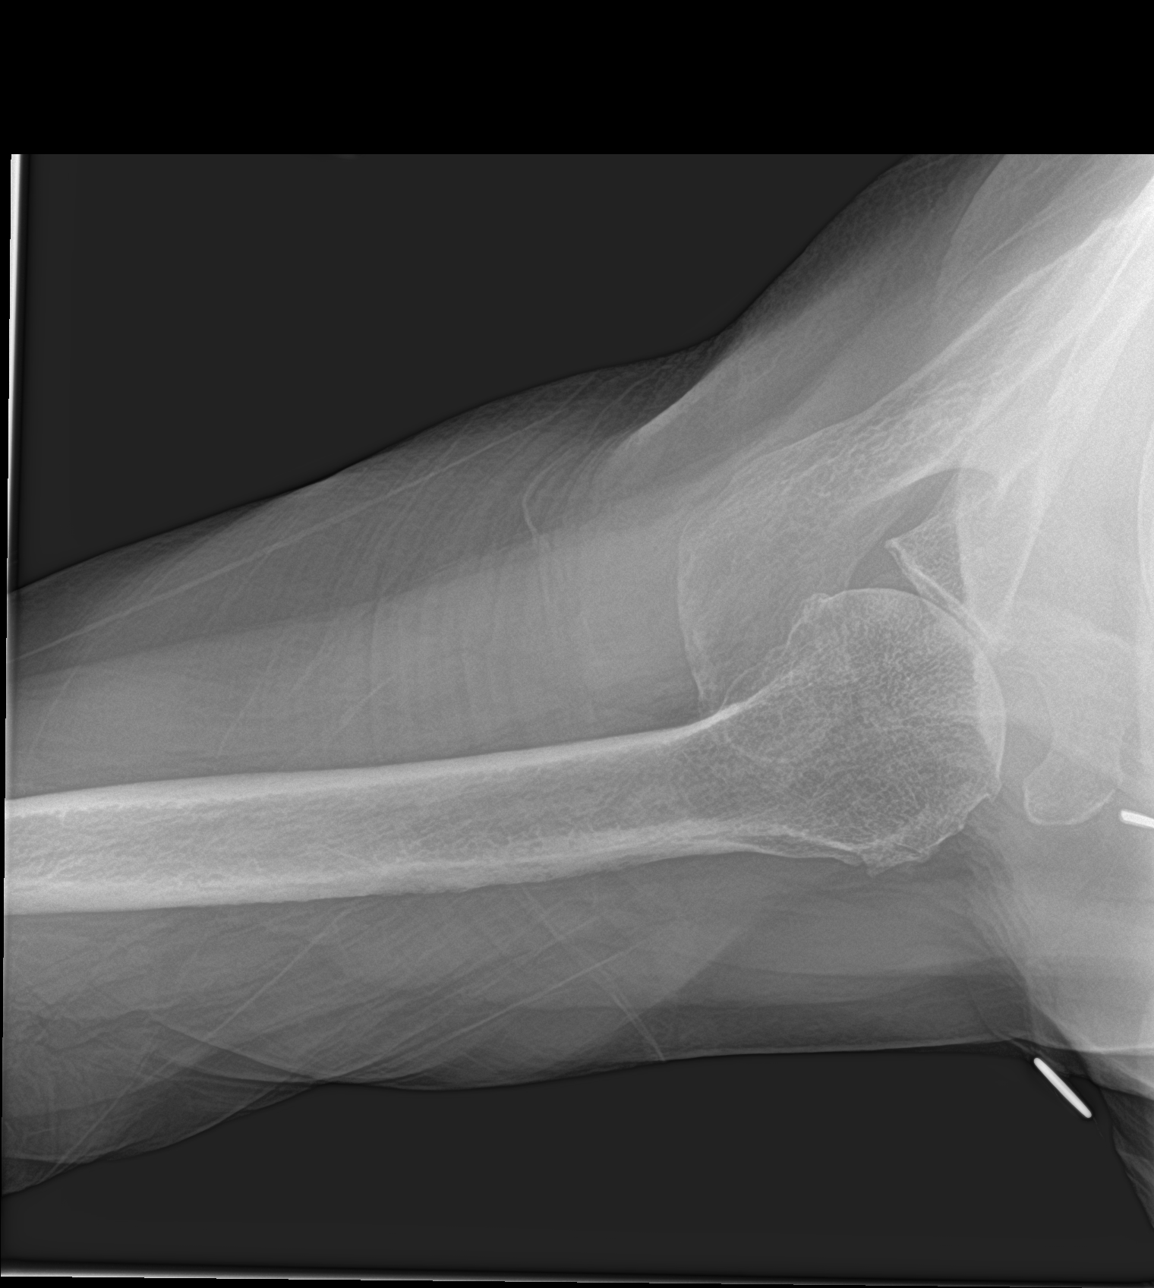

[3 of 3 positions shown; findings below may reference images not displayed]

FINDINGS: No fracture or dislocation. Mild inferior glenohumeral degenerative
change. The right lung apex is clear
IMPRESSION: Mild degenerative changes.  No acute osseous abnormality.

## 2019-03-23 NOTE — Telephone Encounter (Signed)
Please advise, she has a physical set up with her PCP for 04/28/2019.

## 2019-03-25 ENCOUNTER — Telehealth: Payer: Self-pay | Admitting: Family Medicine

## 2019-03-25 NOTE — Progress Notes (Signed)
  Chronic Care Management   Outreach Note  03/25/2019 Name: Jessica Dennis MRN: [id number] DOB: 01-Aug-1932  Referred by: Ann Held, DO Reason for referral : No chief complaint on file.   An unsuccessful telephone outreach was attempted today. The patient was referred to the pharmacist for assistance with care management and care coordination.   Follow Up Plan:   Raynicia Dukes [facility] Scheduler

## 2019-03-27 ENCOUNTER — Telehealth: Payer: Self-pay | Admitting: Family Medicine

## 2019-03-27 NOTE — Chronic Care Management (AMB) (Signed)
  Chronic Care Management   Note  03/27/2019 Name: Lakeda Bajo MRN: [id number] DOB: [birth year]  Kristina Hastings is a 86 y.o. year old female who is a primary care patient of Ann Held, DO. I reached out to Octavio Graves by phone today in response to a referral sent by Ms. Hughie Closs PCP, Ann Held, DO.   Ms. Elbaz was given information about Chronic Care Management services today including:  1. CCM service includes personalized support from designated clinical staff supervised by her physician, including individualized plan of care and coordination with other care providers 2. 24/7 contact phone numbers for assistance for urgent and routine care needs. 3. Service will only be billed when office clinical staff spend 20 minutes or more in a month to coordinate care. 4. Only one practitioner may furnish and bill the service in a calendar month. 5. The patient may stop CCM services at any time (effective at the end of the month) by phone call to the office staff.   Patient agreed to services and verbal consent obtained.   Follow up plan:   Raynicia Dukes [facility] Scheduler

## 2019-04-08 ENCOUNTER — Inpatient Hospital Stay: Payer: HMO | Attending: Hematology & Oncology

## 2019-04-08 ENCOUNTER — Other Ambulatory Visit: Payer: Self-pay

## 2019-04-08 ENCOUNTER — Encounter: Payer: Self-pay | Admitting: Family

## 2019-04-08 ENCOUNTER — Inpatient Hospital Stay (HOSPITAL_BASED_OUTPATIENT_CLINIC_OR_DEPARTMENT_OTHER): Payer: HMO | Admitting: Family

## 2019-04-08 VITALS — BP 143/64 | HR 105 | Temp 97.8°F | Resp 20 | Wt 168.8 lb

## 2019-04-08 DIAGNOSIS — Z79899 Other long term (current) drug therapy: Secondary | ICD-10-CM | POA: Diagnosis not present

## 2019-04-08 DIAGNOSIS — R202 Paresthesia of skin: Secondary | ICD-10-CM | POA: Insufficient documentation

## 2019-04-08 DIAGNOSIS — R251 Tremor, unspecified: Secondary | ICD-10-CM | POA: Diagnosis not present

## 2019-04-08 DIAGNOSIS — D6959 Other secondary thrombocytopenia: Secondary | ICD-10-CM | POA: Insufficient documentation

## 2019-04-08 DIAGNOSIS — R195 Other fecal abnormalities: Secondary | ICD-10-CM

## 2019-04-08 DIAGNOSIS — D500 Iron deficiency anemia secondary to blood loss (chronic): Secondary | ICD-10-CM

## 2019-04-08 DIAGNOSIS — K922 Gastrointestinal hemorrhage, unspecified: Secondary | ICD-10-CM | POA: Diagnosis not present

## 2019-04-08 DIAGNOSIS — D649 Anemia, unspecified: Secondary | ICD-10-CM | POA: Diagnosis not present

## 2019-04-08 DIAGNOSIS — K7460 Unspecified cirrhosis of liver: Secondary | ICD-10-CM | POA: Insufficient documentation

## 2019-04-08 DIAGNOSIS — R161 Splenomegaly, not elsewhere classified: Secondary | ICD-10-CM | POA: Diagnosis not present

## 2019-04-08 LAB — CBC WITH DIFFERENTIAL (CANCER CENTER ONLY)
Abs Immature Granulocytes: 0.04 10*3/uL (ref 0.00–0.07)
Basophils Absolute: 0 10*3/uL (ref 0.0–0.1)
Basophils Relative: 1 %
Eosinophils Absolute: 0.1 10*3/uL (ref 0.0–0.5)
Eosinophils Relative: 3 %
HCT: 38.6 % (ref 36.0–46.0)
Hemoglobin: 12.7 g/dL (ref 12.0–15.0)
Immature Granulocytes: 1 %
Lymphocytes Relative: 15 %
Lymphs Abs: 0.6 10*3/uL — ABNORMAL LOW (ref 0.7–4.0)
MCH: 31.8 pg (ref 26.0–34.0)
MCHC: 32.9 g/dL (ref 30.0–36.0)
MCV: 96.7 fL (ref 80.0–100.0)
Monocytes Absolute: 0.3 10*3/uL (ref 0.1–1.0)
Monocytes Relative: 8 %
Neutro Abs: 3.2 10*3/uL (ref 1.7–7.7)
Neutrophils Relative %: 72 %
Platelet Count: 56 10*3/uL — ABNORMAL LOW (ref 150–400)
RBC: 3.99 MIL/uL (ref 3.87–5.11)
RDW: 18.9 % — ABNORMAL HIGH (ref 11.5–15.5)
WBC Count: 4.3 10*3/uL (ref 4.0–10.5)
nRBC: 0 % (ref 0.0–0.2)

## 2019-04-08 LAB — CMP (CANCER CENTER ONLY)
ALT: 24 U/L (ref 0–44)
AST: 43 U/L — ABNORMAL HIGH (ref 15–41)
Albumin: 4.1 g/dL (ref 3.5–5.0)
Alkaline Phosphatase: 119 U/L (ref 38–126)
Anion gap: 8 (ref 5–15)
BUN: 31 mg/dL — ABNORMAL HIGH (ref 8–23)
CO2: 27 mmol/L (ref 22–32)
Calcium: 10.6 mg/dL — ABNORMAL HIGH (ref 8.9–10.3)
Chloride: 102 mmol/L (ref 98–111)
Creatinine: 1.12 mg/dL — ABNORMAL HIGH (ref 0.44–1.00)
GFR, Est AFR Am: 52 mL/min — ABNORMAL LOW (ref >60)
GFR, Estimated: 45 mL/min — ABNORMAL LOW (ref >60)
Glucose, Bld: 214 mg/dL — ABNORMAL HIGH (ref 70–99)
Potassium: 4.6 mmol/L (ref 3.5–5.1)
Sodium: 137 mmol/L (ref 135–145)
Total Bilirubin: 2 mg/dL — ABNORMAL HIGH (ref 0.3–1.2)
Total Protein: 6.3 g/dL — ABNORMAL LOW (ref 6.5–8.1)

## 2019-04-08 LAB — SAMPLE TO BLOOD BANK

## 2019-04-08 LAB — RETICULOCYTES
Immature Retic Fract: 13.7 % (ref 2.3–15.9)
RBC.: 3.96 MIL/uL (ref 3.87–5.11)
Retic Count, Absolute: 135 10*3/uL (ref 19.0–186.0)
Retic Ct Pct: 3.4 % — ABNORMAL HIGH (ref 0.4–3.1)

## 2019-04-08 NOTE — Progress Notes (Signed)
Hematology and Oncology Follow Up Visit  Jessica Dennis [id number] 1933-01-05 86 y.o. 04/08/2019   Principle Diagnosis:  Chronic thrombocytopenia secondary to cirrhosis and splenomegaly Iron deficiency anemia secondary to GI bleed  Current Therapy:   IV iron as indicated Red blood cell transfusion as needed for variceal bleeding   Interim History:  [name] is here today for follow-up. She is doing well but has noted a slight tremor in her fingers. This has not effected her dexterity so far.  She has responded well to IV iron and Hgb is now  She has not noted any episodes of bleeding. No bruising or petechiae.  No fever, chills, n/v, cough, rash, dizziness, SOB, chest pain, palpitations, abdominal pain or changes in bowel or bladder habits.  She has several scabby bumps on her legs and also one on the right side of her nose. She plans to contact her dermatologist Dr. Melina Copa for follow-up and further evaluation.  No swelling or tenderness in her extremities.  The tingling in her toes is unchanged. She states that she fell in her kitchen a month ago after turning around too quickly. Thankfully she was not seriously injured. She denies syncope. No other episodes.  She has maintained a good appetite but states that she does need to better hydrate daily. Her weight is stable.   ECOG Performance Status: 1 - Symptomatic but completely ambulatory  Medications:  Allergies as of 04/08/2019   No Known Allergies     Medication List       Accurate as of April 08, 2019  1:48 PM. If you have any questions, ask your nurse or doctor.        STOP taking these medications   alclomethasone 0.05 % cream Commonly known as: ACLOVATE Stopped by: [name], NP     TAKE these medications   Accu-Chek FastClix Lancets Misc CHECK FOR BLOOD SUGAR DAILY   ammonium lactate 12 % cream Commonly known as: AMLACTIN   Calcium Carbonate-Vitamin D 600-200 MG-UNIT Caps Take by mouth.   Fish  Oil 1000 MG Caps Take by mouth every morning.   gabapentin 100 MG capsule Commonly known as: NEURONTIN Take 1 capsule (100 mg total) by mouth 2 (two) times daily.   glucose blood test strip Accu-Chek Fastclix Lancet Drum   latanoprost 0.005 % ophthalmic solution Commonly known as: XALATAN Place 1 drop into both eyes at bedtime.   levothyroxine 75 MCG tablet Commonly known as: SYNTHROID TAKE ONE TABLET BY MOUTH DAILY   LORazepam 0.5 MG tablet Commonly known as: ATIVAN Take 1 tablet (0.5 mg total) by mouth daily as needed.   Melatonin 5 MG Caps Take 5 mg by mouth.   NOVOLOG MIX 70/30 [place] Inject into the skin. 35 units in the mornings and 25 at night   pantoprazole 40 MG tablet Commonly known as: PROTONIX TAKE ONE TABLET BY MOUTH DAILY - NEEDS OFFICE VISIT   potassium chloride SA 20 MEQ tablet Commonly known as: KLOR-CON potassium chloride ER 20 mEq tablet,extended release   Prednicarbate 0.1 % Crea Apply topically 2 (two) times daily.   simvastatin 20 MG tablet Commonly known as: ZOCOR TAKE ONE TABLET BY MOUTH AT BEDTIME - NEEDS OFFICE VISIT   spironolactone 50 MG tablet Commonly known as: ALDACTONE TAKE TWO TABLETS BY MOUTH DAILY   SSD 1 % cream Generic drug: silver sulfADIAZINE 1 application daily.   tiZANidine 4 MG tablet Commonly known as: ZANAFLEX TAKE ONE TABLET BY MOUTH EVERY NIGHT AT BEDTIME AS NEEDED  MUSCLE SPASMS   triamterene-hydrochlorothiazide 37.5-25 MG tablet Commonly known as: MAXZIDE-25 triamterene 37.5 mg-hydrochlorothiazide 25 mg tablet   vitamin C 1000 MG tablet Take 1,000 mg by mouth daily.   vitamin E 45 MG (100 UNITS) capsule Take by mouth daily.       Allergies: No Known Allergies  Past Medical History, Surgical history, Social history, and Family History were reviewed and updated.  Review of Systems: All other 10 point review of systems is negative.   Physical Exam:  vitals were not taken for this visit.   Wt Readings  from Last 3 Encounters:  02/11/19 166 lb 12.8 oz (75.7 kg)  12/08/18 164 lb 8 oz (74.6 kg)  11/28/18 159 lb (72.1 kg)    Ocular: Sclerae unicteric, pupils equal, round and reactive to light Ear-nose-throat: Oropharynx clear, dentition fair Lymphatic: No cervical or supraclavicular adenopathy Lungs no rales or rhonchi, good excursion bilaterally Heart regular rate and rhythm, no murmur appreciated Abd soft, nontender, positive bowel sounds, no liver or spleen tip palpated on exam, no fluid wave  MSK no focal spinal tenderness, no joint edema Neuro: non-focal, well-oriented, appropriate affect Breasts: Deferred   Lab Results  Component Value Date   WBC 4.3 04/08/2019   HGB 12.7 04/08/2019   HCT 38.6 04/08/2019   MCV 96.7 04/08/2019   PLT 56 (L) 04/08/2019   Lab Results  Component Value Date   FERRITIN 20 02/11/2019   IRON 65 02/11/2019   TIBC 328 02/11/2019   UIBC 262 02/11/2019   IRONPCTSAT 20 (L) 02/11/2019   Lab Results  Component Value Date   RETICCTPCT 3.4 (H) 04/08/2019   RBC 3.99 04/08/2019   RBC 3.96 04/08/2019   No results found for: KPAFRELGTCHN, LAMBDASER, KAPLAMBRATIO No results found for: IGGSERUM, IGA, IGMSERUM No results found for: Odetta Pink, SPEI   Chemistry      Component Value Date/Time   NA 135 02/11/2019 1341   NA 138 12/18/2016 1314   NA 139 03/05/2016 1139   K 4.6 02/11/2019 1341   K 4.3 12/18/2016 1314   K 3.6 03/05/2016 1139   CL 102 02/11/2019 1341   CL 101 12/18/2016 1314   CO2 27 02/11/2019 1341   CO2 28 12/18/2016 1314   CO2 26 03/05/2016 1139   BUN 20 02/11/2019 1341   BUN 33 (H) 12/18/2016 1314   BUN 14.3 03/05/2016 1139   CREATININE 0.97 02/11/2019 1341   CREATININE 1.06 (H) 02/14/2018 1544   CREATININE 0.7 03/05/2016 1139      Component Value Date/Time   CALCIUM 10.1 02/11/2019 1341   CALCIUM 10.0 12/18/2016 1314   CALCIUM 9.3 03/05/2016 1139   ALKPHOS 130 (H)  02/11/2019 1341   ALKPHOS 94 (H) 12/18/2016 1314   ALKPHOS 127 03/05/2016 1139   AST 42 (H) 02/11/2019 1341   AST 29 03/05/2016 1139   ALT 32 02/11/2019 1341   ALT 26 12/18/2016 1314   ALT 22 03/05/2016 1139   BILITOT 1.5 (H) 02/11/2019 1341   BILITOT 1.75 (H) 03/05/2016 1139       Impression and Plan: Ms. Kizer is a very pleasant63 yo female with chronic thrombocytopenia and iron deficiency anemia secondary to intermittent GI blood loss. We will see what her iron studies show and bring her back in for replacement if needed.  We will plan to see her back in another 8 weeks.  She will contact our office with any questions or concerns. We can certainly see  her sooner if needed.   [name], NP 3/17/20211:48 PM

## 2019-04-09 ENCOUNTER — Other Ambulatory Visit: Payer: HMO

## 2019-04-09 ENCOUNTER — Other Ambulatory Visit: Payer: Self-pay | Admitting: General Practice

## 2019-04-09 ENCOUNTER — Ambulatory Visit: Payer: HMO | Admitting: Family

## 2019-04-09 LAB — FERRITIN: Ferritin: 67 ng/mL (ref 11–307)

## 2019-04-09 LAB — IRON AND TIBC
Iron: 138 ug/dL (ref 41–142)
Saturation Ratios: 53 % (ref 21–57)
TIBC: 258 ug/dL (ref 236–444)
UIBC: 120 ug/dL (ref 120–384)

## 2019-04-09 NOTE — Patient Outreach (Signed)
Client is newly enrolled in the Special Needs Plan program with Type II Diabetes. Recent Hgb A1C is normal at 6.3 %. No Health Risk Assessment on file, Individualized Care Plan (ICP) completed from information in the EMR. Client also has a history of Hyperglycemia controlled on a statin and Anemia. Will send introductory letter with ICP to the primary provider and client, along with educational materials. [facility] pharmacist is currently following client at provider's office. No ED or acute hospital visits in over a year, assigned RN Care Coordinator will follow up in 3 months.

## 2019-04-10 ENCOUNTER — Ambulatory Visit: Payer: HMO | Admitting: Family

## 2019-04-10 ENCOUNTER — Other Ambulatory Visit: Payer: HMO

## 2019-04-13 ENCOUNTER — Ambulatory Visit: Payer: HMO | Admitting: Pharmacist

## 2019-04-13 ENCOUNTER — Other Ambulatory Visit: Payer: Self-pay

## 2019-04-13 DIAGNOSIS — E1165 Type 2 diabetes mellitus with hyperglycemia: Secondary | ICD-10-CM

## 2019-04-13 DIAGNOSIS — K219 Gastro-esophageal reflux disease without esophagitis: Secondary | ICD-10-CM

## 2019-04-13 DIAGNOSIS — E785 Hyperlipidemia, unspecified: Secondary | ICD-10-CM

## 2019-04-13 DIAGNOSIS — E1169 Type 2 diabetes mellitus with other specified complication: Secondary | ICD-10-CM

## 2019-04-13 DIAGNOSIS — Z794 Long term (current) use of insulin: Secondary | ICD-10-CM

## 2019-04-13 DIAGNOSIS — I10 Essential (primary) hypertension: Secondary | ICD-10-CM

## 2019-04-13 DIAGNOSIS — E039 Hypothyroidism, unspecified: Secondary | ICD-10-CM

## 2019-04-13 NOTE — Chronic Care Management (AMB) (Signed)
Chronic Care Management Pharmacy  Name: Marai Teehan  MRN: [id number] DOB: 11-21-1932  Chief Complaint/ HPI  Jessica Dennis,  86 y.o. , female presents for their Initial CCM visit with the clinical pharmacist via telephone due to COVID-19 Pandemic.  PCP : Ann Held, DO  Their chronic conditions include: DM, HTN, HLD, Hypothyroidism, GERD, Neuropathy, Insomnia  Office Visits: 11/03/18: Visit w/ Dr. Etter Sjogren - Leg cramps and wound check recheck of L lower leg. Pt taking abx and doing well. Given tizanidine for leg cramps  10/27/18: Visit w/ Dr. Etter Sjogren - Follow up. Pt has wound on back L calf 3 days prior. Occurred while getting out of the car. Given abx ointment and telfa with cobain used to wrap. F/U in 1 week. No medication changes noted.   Consult Visit: 04/08/19: Heme/Onc visit w/ [name], NP - Iron deficiency anemia. No medication changes noted  03/04/19 - Iron infusion  Medications: Outpatient Encounter Medications as of 04/13/2019  Medication Sig Note  . Accu-Chek FastClix Lancets MISC CHECK FOR BLOOD SUGAR DAILY   . ammonium lactate (AMLACTIN) 12 % cream Apply topically daily as needed.    . Ascorbic Acid (VITAMIN C) 1000 MG tablet Take 1,000 mg by mouth daily.   Marland Kitchen CALCIUM CARBONATE-VITAMIN D PO Take by mouth. Calcium 1244m  Vitamin D 5000 units  Pt also has a calcium 6059mplus 800 units vitamin D supplement   . gabapentin (NEURONTIN) 100 MG capsule Take 1 capsule (100 mg total) by mouth 2 (two) times daily.   . Marland Kitchenlucose blood test strip Accu-Chek [employer] . Insulin Aspart Prot & Aspart (NOVOLOG MIX 70/30 [place]) Inject into the skin. 35 units in the mornings and 25 at night   . latanoprost (XALATAN) 0.005 % ophthalmic solution Place 1 drop into both eyes at bedtime.    . Marland Kitchenevothyroxine (SYNTHROID) 75 MCG tablet TAKE ONE TABLET BY MOUTH DAILY   . LORazepam (ATIVAN) 0.5 MG tablet Take 1 tablet (0.5 mg total) by mouth daily as needed.   . Omega-3  Fatty Acids (FISH OIL) 1200 MG CPDR Take by mouth at bedtime.    . pantoprazole (PROTONIX) 40 MG tablet TAKE ONE TABLET BY MOUTH DAILY - NEEDS OFFICE VISIT   . simvastatin (ZOCOR) 20 MG tablet TAKE ONE TABLET BY MOUTH AT BEDTIME - NEEDS OFFICE VISIT   . spironolactone (ALDACTONE) 50 MG tablet Take 100 mg by mouth daily.   . Marland KitchenSD 1 % cream 1 application daily.  04/13/2019: Uses PRN  . vitamin E 180 MG (400 UNITS) capsule Take by mouth daily.    . Melatonin 5 MG CAPS Take 5 mg by mouth at bedtime as needed.    . Prednicarbate 0.1 % CREA Apply topically 2 (two) times daily.    . Marland KitcheniZANidine (ZANAFLEX) 4 MG tablet TAKE ONE TABLET BY MOUTH EVERY NIGHT AT BEDTIME AS NEEDED MUSCLE SPASMS (Patient not taking: Reported on 04/13/2019) 04/13/2019: Patient doesn't remember receiving    No facility-administered encounter medications on file as of 04/13/2019.   Immunization History  Administered Date(s) Administered  . Fluad Quad(high Dose 65+) 10/27/2018  . Hepatitis B, adult 05/12/2014  . Hepatitis B, ped/adol 08/10/2014  . Influenza Split 11/02/2009, 10/15/2010  . Influenza, High Dose Seasonal PF 10/13/2014, 01/26/2017  . Influenza, Seasonal, Injecte, Preservative Fre 10/13/2014  . Influenza,inj,quad, With Preservative 11/22/2016  . Influenza-Unspecified 11/25/2013, 10/06/2015, 10/22/2017  . Pneumococcal Conjugate-13 05/17/2014  . Pneumococcal Polysaccharide-23 10/06/2015  . Tdap 10/02/2011, 11/06/2016  .  Zoster 01/22/2005  03/26/19 - Moderna COVID vaccine 04/28/19 - Moderna COVID vaccine #2 (scheduled)    Current Diagnosis/Assessment:  Goals Addressed            This Visit's Progress   . A1c goal less than 7%      . Blood pressure goal less than 140/90      . Find a new eye doctor      . Only take 1 supplement for calcium/vitamin D       Recommended values of calcium and vitamin D: Calcium - 126m daily Vitamin D - 800 units daily    . Pharmacy Care Plan       CARE PLAN ENTRY  Current  Barriers:  . Chronic Disease Management support, education, and care coordination needs related to DM, HTN, HLD, Hypothyroidism, GERD, Neuropathy, Insomnia  Pharmacist Clinical Goal(s):  . Find a new eye doctor . A1c goal <7% . Blood pressure goal <140/90  Interventions: . Comprehensive medication review performed. . Only take 1 calcium/vitamin D supplement  Patient Self Care Activities:  . Patient verbalizes understanding of plan to follow as described above, Self administers medications as prescribed, Calls pharmacy for medication refills, and Calls provider office for new concerns or questions  Initial goal documentation       [place] Lab Results  Component Value Date/Time   HGBA1C 6.3 (H) 03/18/2018 10:40 AM   HGBA1C 5.9 04/26/2017 11:58 AM   EGFR 76 (L) 03/05/2016 11:39 AM   EGFR 82 (L) 11/25/2015 11:38 AM   MICROALBUR 1.0 10/27/2018 02:43 PM   MICROALBUR <0.7 10/26/2016 11:44 AM     Checking BG: Every other Kenn Rekowski  Recent FBG Readings:  199 207 207 188 168 176 196 148 192 200  Average 188.1  Patient has failed these meds in past: metformin (GFR? Toleration?) Patient is currently controlled per a1c, but not per most recent FBG on the following medications: Novolog 70/30 35 units AM +25 units PM  Followed by Dr. BChalmers Cater Last diabetic Foot exam:  Lab Results  Component Value Date/Time   HMDIABEYEEXA No Retinopathy 06/15/2014 12:00 AM    Last diabetic Eye exam: No results found for: HMDIABFOOTEX   We discussed: The possibility that pt's a1c is elevated from last reading a year ago based on pt's current BG readings. DM goals (a1c <7%, FBG 80-130, PPBG <180)  Plan -Continue current medications    Hypertension   CMP Latest Ref Rng & Units 04/08/2019 02/11/2019 12/08/2018  Glucose 70 - 99 mg/dL 214(H) 372(H) 118(H)  BUN 8 - 23 mg/dL 31(H) 20 28(H)  Creatinine 0.44 - 1.00 mg/dL 1.12(H)  0.97 1.09(H)  Sodium 135 - 145 mmol/L 137 135 135  Potassium 3.5 - 5.1 mmol/L 4.6 4.6 4.6  Chloride 98 - 111 mmol/L 102 102 103  CO2 22 - 32 mmol/L 27 27 24   Calcium 8.9 - 10.3 mg/dL 10.6(H) 10.1 10.2  Total Protein 6.5 - 8.1 g/dL 6.3(L) 6.3(L) 6.5  Total Bilirubin 0.3 - 1.2 mg/dL 2.0(H) 1.5(H) 1.7(H)  Alkaline Phos 38 - 126 U/L 119 130(H) 114  AST 15 - 41 U/L 43(H) 42(H) 33  ALT 0 - 44 U/L 24 32 30  GFR      45   54   47  BP today is:  Unable to assess due to phone visit  Office blood pressures are  BP Readings from Last 3 Encounters:  04/08/19 (Marland Kitchen  143/64  03/04/19 138/67  03/02/19 138/61    Patient has failed these meds in the past: quinapril (listed in D/C meds. No apparent reason for D/C)  Patient is currently controlled on the following medications: spironolactone 24m #2 daily (was not originally on medication list. Added to medication list)  Spironolactone prescribed by Dr. GCarlean Purl Patient checks BP at home Never (does not have BP cuff)  She states her BP is always well controlled when she goes in for iron infusions.   Plan -Continue current medications   Hyperlipidemia   Lipid Panel     Component Value Date/Time   CHOL 109 10/27/2018 1443   TRIG 73.0 10/27/2018 1443   HDL 47.50 10/27/2018 1443   CHOLHDL 2 10/27/2018 1443   VLDL 14.6 10/27/2018 1443   LDLCALC 47 10/27/2018 1443   LDLCALC 57 02/14/2018 1544     The ASCVD Risk score (Goff DC Jr., et al., 2013) failed to calculate for the following reasons:   The 2013 ASCVD risk score is only valid for ages 482to 780  Patient has failed these meds in past: None noted  Patient is currently controlled on the following medications: simvastatin 222mdaily, fish oil 120063maily  Plan -Continue current medications  Hypothyroidism   TSH  Date Value Ref Range Status  10/27/2018 1.78 0.35 - 4.50 uIU/mL Final     Patient has failed these meds in past: None noted  Patient is currently controlled on the  following medications: levothyroxine 46m54maily  Plan -Continue current medications  GERD    Patient has failed these meds in past: None noted  Patient is currently controlled on the following medications: pantoprazole 40mg50mly  Breakthrough symptoms: None   Plan -Continue current medications   Neuropathy    Patient has failed these meds in past: None noted  Patient is currently controlled on the following medications: gabapentin 100mg 57m Feels the gabapentin works for her numbness in her feet  Plan -Continue current medications   Insomnia    Patient has failed these meds in past: melatonin (ineffective) Patient is currently controlled on the following medications: lorazepam 0.5mg da52m at bedtime   Plan -Continue current medications   Miscellaneous Meds Calcium/vitamin D She takes 2 different supplements.  Calcium/Vitamin D 600mg/8074mits #1 AM Calcium/Vitamin D 1200mg/50032mits #1 PM She is considering replacing both with Centrum Silver Recommended that patient continue the calcium/vitamin D 1200mg/500017mts and D/C the other 2. Especially noting her normal bone density as of 04/13/2013  Latanoprost - plans to use the medication until she runs out then D/C (needs to find new eye doctor) Ammonium lactactate - used for body rash Alclometasone dipropionate 0.05% - uses on her lips (warned about using steroids on lips) Vitamin E 180 units daily Vitamin C 1000mg  SSD 7mream  Old Meds Triamterene-hctz (still has this at home, but does not use) Hydrocodone 5-325 (still has this at home, but does not use; expiration date in 2013)  Advised patient to dispose of these medications, but she states she will keep them just in case she needs them

## 2019-04-13 NOTE — Patient Instructions (Signed)
Visit Information  Goals Addressed            This Visit's Progress    A1c goal less than 7%       Blood pressure goal less than 140/90       Find a new eye doctor       Only take 1 supplement for calcium/vitamin D       Recommended values of calcium and vitamin D: Calcium - 1200mg  daily Vitamin D - 800 units daily     Pharmacy Care Plan       CARE PLAN ENTRY  Current Barriers:   Chronic Disease Management support, education, and care coordination needs related to DM, HTN, HLD, Hypothyroidism, GERD, Neuropathy, Insomnia  Pharmacist Clinical Goal(s):   Find a new eye doctor  A1c goal <7%  Blood pressure goal <140/90  Interventions:  Comprehensive medication review performed.  Only take 1 calcium/vitamin D supplement  Patient Self Care Activities:   Patient verbalizes understanding of plan to follow as described above, Self administers medications as prescribed, Calls pharmacy for medication refills, and Calls provider office for new concerns or questions  Initial goal documentation        Jessica Dennis was given information about Chronic Care Management services today including:  1. CCM service includes personalized support from designated clinical staff supervised by her physician, including individualized plan of care and coordination with other care providers 2. 24/7 contact phone numbers for assistance for urgent and routine care needs. 3. Standard insurance, coinsurance, copays and deductibles apply for chronic care management only during months in which we provide at least 20 minutes of these services. Most insurances cover these services at 100%, however patients may be responsible for any copay, coinsurance and/or deductible if applicable. This service may help you avoid the need for more expensive face-to-face services. 4. Only one practitioner may furnish and bill the service in a calendar month. 5. The patient may stop CCM services at any time (effective  at the end of the month) by phone call to the office staff.  Patient agreed to services and verbal consent obtained.   The patient verbalized understanding of instructions provided today and agreed to receive a mailed copy of patient instruction and/or educational materials. Telephone follow up appointment with pharmacy team member scheduled for: 10/12/2019  Jessica Dennis, Jessica Dennis Clinical Pharmacist [facility] [place] protect organs, store calcium, anchor muscles, and support the whole body. Keeping your bones strong is important, especially as you get older. You can take actions to help keep your bones strong and healthy. Why is keeping my bones healthy important?  Keeping your bones healthy is important because your body constantly replaces bone cells. Cells get old, and new cells take their place. As we age, we lose bone cells because the body may not be able to make enough new cells to replace the old cells. The amount of bone cells and bone tissue you have is referred to as bone mass. The higher your bone mass, the stronger your bones. The aging process leads to an overall loss of bone mass in the body, which can increase the likelihood of:  Joint pain and stiffness.  Broken bones.  A condition in which the bones become weak and brittle (osteoporosis). A large decline in bone mass occurs in older adults. In women, it occurs about the time of menopause. What actions can I take to keep my bones healthy? Good  health habits are important for maintaining healthy bones. This includes eating nutritious foods and exercising regularly. To have healthy bones, you need to get enough of the right minerals and vitamins. Most nutrition experts recommend getting these nutrients from the foods that you eat. In some cases, taking supplements may also be recommended. Doing certain types of exercise is also important for bone health. What are the  nutritional recommendations for healthy bones?  Eating a well-balanced diet with plenty of calcium and vitamin D will help to protect your bones. Nutritional recommendations vary from person to person. Ask your health care provider what is healthy for you. Here are some general guidelines. Get enough calcium Calcium is the most important (essential) mineral for bone health. Most people can get enough calcium from their diet, but supplements may be recommended for people who are at risk for osteoporosis. Good sources of calcium include:  Dairy products, such as low-fat or nonfat milk, cheese, and yogurt.  Dark green leafy vegetables, such as bok choy and broccoli.  Calcium-fortified foods, such as orange juice, cereal, bread, soy beverages, and tofu products.  Nuts, such as almonds. Follow these recommended amounts for daily calcium intake:  Children, age 73-3: 700 mg.  Children, age 78-8: 1,000 mg.  Children, age 27-13: 1,300 mg.  Teens, age 69-18: 1,300 mg.  Adults, age 61-50: 1,000 mg.  Adults, age 36-70: ? Men: 1,000 mg. ? Women: 1,200 mg.  Adults, age 61 or older: 1,200 mg.  Pregnant and breastfeeding females: ? Teens: 1,300 mg. ? Adults: 1,000 mg. Get enough vitamin D Vitamin D is the most essential vitamin for bone health. It helps the body absorb calcium. Sunlight stimulates the skin to make vitamin D, so be sure to get enough sunlight. If you live in a cold climate or you do not get outside often, your health care provider may recommend that you take vitamin D supplements. Good sources of vitamin D in your diet include:  Egg yolks.  Saltwater fish.  Milk and cereal fortified with vitamin D. Follow these recommended amounts for daily vitamin D intake:  Children and teens, age 73-18: 600 international units.  Adults, age 737 or younger: 400-800 international units.  Adults, age 70 or older: 800-1,000 international units. Get other important nutrients Other nutrients  that are important for bone health include:  Phosphorus. This mineral is found in meat, poultry, dairy foods, nuts, and legumes. The recommended daily intake for adult men and adult women is 700 mg.  Magnesium. This mineral is found in seeds, nuts, dark green vegetables, and legumes. The recommended daily intake for adult men is 400-420 mg. For adult women, it is 310-320 mg.  Vitamin K. This vitamin is found in green leafy vegetables. The recommended daily intake is 120 mg for adult men and 90 mg for adult women. What type of physical activity is best for building and maintaining healthy bones? Weight-bearing and strength-building activities are important for building and maintaining healthy bones. Weight-bearing activities cause muscles and bones to work against gravity. Strength-building activities increase the strength of the muscles that support bones. Weight-bearing and muscle-building activities include:  Walking and hiking.  Jogging and running.  Dancing.  Gym exercises.  Lifting weights.  Tennis and racquetball.  Climbing stairs.  Aerobics. Adults should get at least 30 minutes of moderate physical activity on most days. Children should get at least 60 minutes of moderate physical activity on most days. Ask your health care provider what type of exercise is best  for you. How can I find out if my bone mass is low? Bone mass can be measured with an X-ray test called a bone mineral density (BMD) test. This test is recommended for all women who are age 25 or older. It may also be recommended for:  Men who are age 23 or older.  People who are at risk for osteoporosis because of: ? Having bones that break easily. ? Having a long-term disease that weakens bones, such as kidney disease or rheumatoid arthritis. ? Having menopause earlier than normal. ? Taking medicine that weakens bones, such as steroids, thyroid hormones, or hormone treatment for breast cancer or prostate  cancer. ? Smoking. ? Drinking three or more alcoholic drinks a Buffy Ehler. If you find that you have a low bone mass, you may be able to prevent osteoporosis or further bone loss by changing your diet and lifestyle. Where can I find more information? For more information, check out the following websites:  [place]: AviationTales.fr  [employer] of Health: www.bones.SouthExposed.es  International Osteoporosis Foundation: Administrator.iofbonehealth.org Summary  The aging process leads to an overall loss of bone mass in the body, which can increase the likelihood of broken bones and osteoporosis.  Eating a well-balanced diet with plenty of calcium and vitamin D will help to protect your bones.  Weight-bearing and strength-building activities are also important for building and maintaining strong bones.  Bone mass can be measured with an X-ray test called a bone mineral density (BMD) test. This information is not intended to replace advice given to you by your health care provider. Make sure you discuss any questions you have with your health care provider. Document Revised: 02/04/2017 Document Reviewed: 02/04/2017 Elsevier Patient Education  2020 [employer].

## 2019-04-14 ENCOUNTER — Other Ambulatory Visit: Payer: Self-pay

## 2019-04-14 DIAGNOSIS — Z794 Long term (current) use of insulin: Secondary | ICD-10-CM

## 2019-04-14 DIAGNOSIS — E1165 Type 2 diabetes mellitus with hyperglycemia: Secondary | ICD-10-CM

## 2019-04-14 DIAGNOSIS — E785 Hyperlipidemia, unspecified: Secondary | ICD-10-CM

## 2019-04-14 DIAGNOSIS — E1169 Type 2 diabetes mellitus with other specified complication: Secondary | ICD-10-CM

## 2019-04-14 DIAGNOSIS — I10 Essential (primary) hypertension: Secondary | ICD-10-CM

## 2019-04-14 DIAGNOSIS — E039 Hypothyroidism, unspecified: Secondary | ICD-10-CM

## 2019-04-15 ENCOUNTER — Telehealth: Payer: Self-pay | Admitting: Family Medicine

## 2019-04-28 ENCOUNTER — Ambulatory Visit (INDEPENDENT_AMBULATORY_CARE_PROVIDER_SITE_OTHER): Payer: HMO | Admitting: Family Medicine

## 2019-04-28 ENCOUNTER — Other Ambulatory Visit: Payer: Self-pay

## 2019-04-28 ENCOUNTER — Encounter: Payer: Self-pay | Admitting: Family Medicine

## 2019-04-28 VITALS — BP 118/60 | HR 80 | Temp 97.1°F | Resp 18 | Ht 65.0 in | Wt 172.4 lb

## 2019-04-28 DIAGNOSIS — E785 Hyperlipidemia, unspecified: Secondary | ICD-10-CM

## 2019-04-28 DIAGNOSIS — D509 Iron deficiency anemia, unspecified: Secondary | ICD-10-CM

## 2019-04-28 DIAGNOSIS — Z1231 Encounter for screening mammogram for malignant neoplasm of breast: Secondary | ICD-10-CM

## 2019-04-28 DIAGNOSIS — I10 Essential (primary) hypertension: Secondary | ICD-10-CM

## 2019-04-28 DIAGNOSIS — Z794 Long term (current) use of insulin: Secondary | ICD-10-CM

## 2019-04-28 DIAGNOSIS — E1169 Type 2 diabetes mellitus with other specified complication: Secondary | ICD-10-CM

## 2019-04-28 DIAGNOSIS — Z0000 Encounter for general adult medical examination without abnormal findings: Secondary | ICD-10-CM

## 2019-04-28 DIAGNOSIS — E039 Hypothyroidism, unspecified: Secondary | ICD-10-CM | POA: Diagnosis not present

## 2019-04-28 DIAGNOSIS — E1165 Type 2 diabetes mellitus with hyperglycemia: Secondary | ICD-10-CM

## 2019-04-28 DIAGNOSIS — D229 Melanocytic nevi, unspecified: Secondary | ICD-10-CM

## 2019-04-28 DIAGNOSIS — R251 Tremor, unspecified: Secondary | ICD-10-CM | POA: Diagnosis not present

## 2019-04-28 LAB — CBC WITH DIFFERENTIAL/PLATELET
Basophils Absolute: 0 10*3/uL (ref 0.0–0.1)
Basophils Relative: 0.6 % (ref 0.0–3.0)
Eosinophils Absolute: 0.1 10*3/uL (ref 0.0–0.7)
Eosinophils Relative: 2.6 % (ref 0.0–5.0)
HCT: 34.8 % — ABNORMAL LOW (ref 36.0–46.0)
Hemoglobin: 11.8 g/dL — ABNORMAL LOW (ref 12.0–15.0)
Lymphocytes Relative: 9.6 % — ABNORMAL LOW (ref 12.0–46.0)
Lymphs Abs: 0.4 10*3/uL — ABNORMAL LOW (ref 0.7–4.0)
MCHC: 34 g/dL (ref 30.0–36.0)
MCV: 100.1 fl — ABNORMAL HIGH (ref 78.0–100.0)
Monocytes Absolute: 0.3 10*3/uL (ref 0.1–1.0)
Monocytes Relative: 6.8 % (ref 3.0–12.0)
Neutro Abs: 3.3 10*3/uL (ref 1.4–7.7)
Neutrophils Relative %: 80.4 % — ABNORMAL HIGH (ref 43.0–77.0)
Platelets: 51 10*3/uL — ABNORMAL LOW (ref 150.0–400.0)
RBC: 3.47 Mil/uL — ABNORMAL LOW (ref 3.87–5.11)
RDW: 16.4 % — ABNORMAL HIGH (ref 11.5–15.5)
WBC: 4.1 10*3/uL (ref 4.0–10.5)

## 2019-04-28 LAB — LIPID PANEL
Cholesterol: 124 mg/dL (ref 0–200)
HDL: 66.9 mg/dL (ref >39.00)
LDL Cholesterol: 43 mg/dL (ref 0–99)
NonHDL: 57.19
Total CHOL/HDL Ratio: 2
Triglycerides: 71 mg/dL (ref 0.0–149.0)
VLDL: 14.2 mg/dL (ref 0.0–40.0)

## 2019-04-28 LAB — COMPREHENSIVE METABOLIC PANEL
ALT: 20 U/L (ref 0–35)
AST: 33 U/L (ref 0–37)
Albumin: 3.7 g/dL (ref 3.5–5.2)
Alkaline Phosphatase: 121 U/L — ABNORMAL HIGH (ref 39–117)
BUN: 21 mg/dL (ref 6–23)
CO2: 25 mEq/L (ref 19–32)
Calcium: 9.2 mg/dL (ref 8.4–10.5)
Chloride: 102 mEq/L (ref 96–112)
Creatinine, Ser: 1.02 mg/dL (ref 0.40–1.20)
GFR: 51.59 mL/min — ABNORMAL LOW (ref >60.00)
Glucose, Bld: 321 mg/dL — ABNORMAL HIGH (ref 70–99)
Potassium: 4 mEq/L (ref 3.5–5.1)
Sodium: 134 mEq/L — ABNORMAL LOW (ref 135–145)
Total Bilirubin: 2.1 mg/dL — ABNORMAL HIGH (ref 0.2–1.2)
Total Protein: 5.8 g/dL — ABNORMAL LOW (ref 6.0–8.3)

## 2019-04-28 LAB — TSH: TSH: 3.31 u[IU]/mL (ref 0.35–4.50)

## 2019-04-28 LAB — MICROALBUMIN / CREATININE URINE RATIO
Creatinine,U: 142.2 mg/dL
Microalb Creat Ratio: 1.2 mg/g (ref 0.0–30.0)
Microalb, Ur: 1.6 mg/dL (ref 0.0–1.9)

## 2019-04-28 LAB — HEMOGLOBIN A1C: Hgb A1c MFr Bld: 6.3 % (ref 4.6–6.5)

## 2019-04-28 NOTE — Progress Notes (Signed)
Subjective:     Jessica Dennis is a 86 y.o. female and is here for a comprehensive physical exam. The patient reports problems - mult skin lesions she is concerned about and she needs f/u dm, chol and bp.  no refills needed now .  Social History   Socioeconomic History  . Marital status: Divorced    Spouse name: Not on file  . Number of children: 2  . Years of education: Not on file  . Highest education level: Not on file  Occupational History  . Occupation: retired  Tobacco Use  . Smoking status: Never Smoker  . Smokeless tobacco: Never Used  . Tobacco comment: NEVER USED TOBACCO  Substance and Sexual Activity  . Alcohol use: No    Alcohol/week: 2.0 standard drinks    Types: 2 Standard drinks or equivalent per week    Comment: rare occassion  . Drug use: No  . Sexual activity: Never  Other Topics Concern  . Not on file  Social History Narrative   The patient is divorced. She is originally from [place]. She has 2 sons. 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   08/10/2014   Social Determinants of Health   Financial Resource Strain:   . Difficulty of Paying Living Expenses:   Food Insecurity:   . Worried About Charity fundraiser in the Last Year:   . Arboriculturist in the Last Year:   Transportation Needs:   . Film/video editor (Medical):   Marland Kitchen Lack of Transportation (Non-Medical):   Physical Activity:   . Days of Exercise per Week:   . Minutes of Exercise per Session:   Stress:   . Feeling of Stress :   Social Connections:   . Frequency of Communication with Friends and Family:   . Frequency of Social Gatherings with Friends and Family:   . Attends Religious Services:   . Active Member of Clubs or Organizations:   . Attends Archivist Meetings:   Marland Kitchen Marital Status:   Intimate Partner Violence:   . Fear of Current or Ex-Partner:   . Emotionally Abused:   Marland Kitchen Physically Abused:   . Sexually Abused:    Health  Maintenance  Topic Date Due  . FOOT EXAM  05/12/2015  . OPHTHALMOLOGY EXAM  11/22/2016  . MAMMOGRAM  04/19/2018  . HEMOGLOBIN A1C  09/16/2018  . COLONOSCOPY  10/27/2019 (Originally 10/25/2015)  . INFLUENZA VACCINE  08/23/2019  . URINE MICROALBUMIN  10/27/2019  . TETANUS/TDAP  11/07/2026  . DEXA SCAN  Completed  . PNA vac Low Risk Adult  Addressed    The following portions of the patient's history were reviewed and updated as appropriate:  She  has a past medical history of Anemia, Angiodysplasia of ascending colon (10/25/2014), Anxiety, Arthritis, Borderline diabetes, Cancer of the skin, basal cell (09/03/2012), Cirrhosis ([place]), Diabetes mellitus without complication ([place]), Diverticular disease, GAVE (gastric antral vascular ectasia) (09/09/2017), GERD (gastroesophageal reflux disease), Hypertension, Iron deficiency anemia due to chronic blood loss (01/19/2015), Portal hypertensive gastropathy ([place]) (08/02/2016), and Thyroid disease. She does not have any pertinent problems on file. She  has a past surgical history that includes Abdominal hysterectomy; Total hip arthroplasty (Bilateral, 1993, 2006); Appendectomy; Tonsillectomy; Leg skin lesion  biopsy / excision (12/11/14); Mohs surgery; Esophagogastroduodenoscopy; IR Paracentesis (05/25/2016); Colonoscopy; and Upper gastrointestinal endoscopy (09/09/2017). Her family history includes Bladder Cancer in her father; Diabetes in her mother; Heart attack in her father. She  reports that  she has never smoked. She has never used smokeless tobacco. She reports that she does not drink alcohol or use drugs. She has a current medication list which includes the following prescription(s): accu-chek fastclix lancets, ammonium lactate, vitamin c, calcium carbonate-vitamin d, gabapentin, glucose blood, insulin aspart prot & aspart, latanoprost, levothyroxine, lorazepam, melatonin, fish oil, pantoprazole, prednicarbate, simvastatin, spironolactone, ssd, tizanidine, and  vitamin e. Current Outpatient Medications on File Prior to Visit  Medication Sig Dispense Refill  . Accu-Chek FastClix Lancets MISC CHECK FOR BLOOD SUGAR DAILY 102 each 5  . ammonium lactate (AMLACTIN) 12 % cream Apply topically daily as needed.     . Ascorbic Acid (VITAMIN C) 1000 MG tablet Take 1,000 mg by mouth daily.    Marland Kitchen CALCIUM CARBONATE-VITAMIN D PO Take by mouth. Calcium 1200mg   Vitamin D 5000 units  Pt also has a calcium 600mg  plus 800 units vitamin D supplement    . gabapentin (NEURONTIN) 100 MG capsule Take 1 capsule (100 mg total) by mouth 2 (two) times daily. 180 capsule 1  . glucose blood test strip Accu-Chek [employer]    . Insulin Aspart Prot & Aspart (NOVOLOG MIX 70/30 [place]) Inject into the skin. 35 units in the mornings and 25 at night    . latanoprost (XALATAN) 0.005 % ophthalmic solution Place 1 drop into both eyes at bedtime.     Marland Kitchen levothyroxine (SYNTHROID) 75 MCG tablet TAKE ONE TABLET BY MOUTH DAILY 90 tablet 1  . LORazepam (ATIVAN) 0.5 MG tablet Take 1 tablet (0.5 mg total) by mouth daily as needed. 90 tablet 1  . Melatonin 5 MG CAPS Take 5 mg by mouth at bedtime as needed.     . Omega-3 Fatty Acids (FISH OIL) 1200 MG CPDR Take by mouth at bedtime.     . pantoprazole (PROTONIX) 40 MG tablet TAKE ONE TABLET BY MOUTH DAILY - NEEDS OFFICE VISIT 90 tablet 1  . Prednicarbate 0.1 % CREA Apply topically 2 (two) times daily.     . simvastatin (ZOCOR) 20 MG tablet TAKE ONE TABLET BY MOUTH AT BEDTIME - NEEDS OFFICE VISIT 90 tablet 1  . spironolactone (ALDACTONE) 50 MG tablet Take 100 mg by mouth daily.    Marland Kitchen SSD 1 % cream 1 application daily.     Marland Kitchen tiZANidine (ZANAFLEX) 4 MG tablet TAKE ONE TABLET BY MOUTH EVERY NIGHT AT BEDTIME AS NEEDED MUSCLE SPASMS 30 tablet 2  . vitamin E 180 MG (400 UNITS) capsule Take by mouth daily.      No current facility-administered medications on file prior to visit.   She has No Known Allergies.. Health Maintenance  Topic Date Due  .  FOOT EXAM  05/12/2015  . OPHTHALMOLOGY EXAM  11/22/2016  . HEMOGLOBIN A1C  09/16/2018  . COLONOSCOPY  10/27/2019 (Originally 10/25/2015)  . INFLUENZA VACCINE  08/23/2019  . URINE MICROALBUMIN  10/27/2019  . TETANUS/TDAP  11/07/2026  . DEXA SCAN  Completed  . PNA vac Low Risk Adult  Addressed   Immunization History  Administered Date(s) Administered  . Fluad Quad(high Dose 65+) 10/27/2018  . Hepatitis B, adult 05/12/2014  . Hepatitis B, ped/adol 08/10/2014  . Influenza Split 11/02/2009, 10/15/2010  . Influenza, High Dose Seasonal PF 10/13/2014, 01/26/2017  . Influenza, Seasonal, Injecte, Preservative Fre 10/13/2014  . Influenza,inj,quad, With Preservative 11/22/2016  . Influenza-Unspecified 11/25/2013, 10/06/2015, 10/22/2017  . Moderna SARS-COVID-2 Vaccination 03/26/2019, 04/23/2019  . Pneumococcal Conjugate-13 05/17/2014  . Pneumococcal Polysaccharide-23 10/06/2015  . Tdap 10/02/2011, 11/06/2016  . Zoster  01/22/2005   Review of Systems Review of Systems  Constitutional: Negative for activity change, appetite change and fatigue.  HENT: Negative for hearing loss, congestion, tinnitus and ear discharge.  dentist q76m Eyes: Negative for visual disturbance (see optho q1y -- vision corrected to 20/20 with glasses).  Respiratory: Negative for cough, chest tightness and shortness of breath.   Cardiovascular: Negative for chest pain, palpitations and leg swelling.  Gastrointestinal: Negative for abdominal pain, diarrhea, constipation and abdominal distention.  Genitourinary: Negative for urgency, frequency, decreased urine volume and difficulty urinating.  Musculoskeletal: Negative for back pain, arthralgias and gait problem.  Skin: Negative for color change, pallor and rash.  Neurological: Negative for dizziness, light-headedness, numbness and headaches.  Hematological: Negative for adenopathy. Does not bruise/bleed easily.  Psychiatric/Behavioral: Negative for suicidal ideas, confusion,  sleep disturbance, self-injury, dysphoric mood, decreased concentration and agitation.       Objective:    BP 118/60 (BP Location: Left Arm, Patient Position: Sitting, Cuff Size: Normal)   Pulse 80   Temp (!) 97.1 F (36.2 C) (Temporal)   Resp 18   Ht 5\' 5"  (1.651 m)   Wt 172 lb 6.4 oz (78.2 kg)   SpO2 94%   BMI 28.69 kg/m  General appearance: alert, cooperative, appears stated age and no distress Head: Normocephalic, without obvious abnormality, atraumatic Eyes: conjunctivae/corneas clear. PERRL, EOM's intact. Fundi benign. Ears: normal TM's and external ear canals both ears Neck: no adenopathy, no carotid bruit, no JVD, supple, symmetrical, trachea midline and thyroid not enlarged, symmetric, no tenderness/mass/nodules Back: symmetric, no curvature. ROM normal. No CVA tenderness. Lungs: clear to auscultation bilaterally Breasts: normal appearance, no masses or tenderness Heart: regular rate and rhythm, S1, S2 normal, no murmur, click, rub or gallop Abdomen: soft, non-tender; bowel sounds normal; no masses,  no organomegaly Pelvic: not indicated; status post hysterectomy, negative ROS Extremities: extremities normal, atraumatic, no cyanosis or edema Pulses: 2+ and symmetric Skin: Skin color, texture, turgor normal. No rashes or lesions Lymph nodes: Cervical, supraclavicular, and axillary nodes normal. Neurologic: Alert and oriented X 3, normal strength and tone. Normal symmetric reflexes. Normal coordination and gait    Assessment:    Healthy female exam.      Plan:     ghm utd--- pt will discuss shingrix with pharmacy Check labsSee After Visit Summary for Counseling Recommendations    1. Encounter for screening mammogram for malignant neoplasm of breast Pt decided she wants to have mammogram  - MM DIAG BREAST TOMO BILATERAL; Future  2. Tremor Pt states last app was cancelled because provider was quarantined due to covid  App was not rescheduled  - Ambulatory  referral to Neurology  3. Suspicious nevus On nose and shin  Pt has derm appointment  - Ambulatory referral to Dermatology  4. Type 2 diabetes mellitus with hyperglycemia, with long-term current use of insulin (HCC) Per endo  - Ambulatory referral to Ophthalmology - Lipid panel - Hemoglobin A1c - Comprehensive metabolic panel - Microalbumin / creatinine urine ratio  5. Essential hypertension Well controlled, no changes to meds. Encouraged heart healthy diet such as the DASH diet and exercise as tolerated.  - Hemoglobin A1c - Comprehensive metabolic panel - Microalbumin / creatinine urine ratio  6. Hyperlipidemia associated with type 2 diabetes mellitus ([place]) Tolerating statin, encouraged heart healthy diet, avoid trans fats, minimize simple carbs and saturated fats. Increase exercise as tolerated - Lipid panel - Hemoglobin A1c - Comprehensive metabolic panel - Microalbumin / creatinine urine ratio  7. Hypothyroidism,  unspecified type Stable Check labs  con't synthroid  - TSH  8. Preventative health care See above  D/w pt shigrix   9. Iron deficiency anemia, unspecified iron deficiency anemia type Per hematology - CBC with Differential/Platelet

## 2019-04-28 NOTE — Patient Instructions (Signed)
Preventive Care 38 Years and Older, Female Preventive care refers to lifestyle choices and visits with your health care provider that can promote health and wellness. This includes:  A yearly physical exam. This is also called an annual well check.  Regular dental and eye exams.  Immunizations.  Screening for certain conditions.  Healthy lifestyle choices, such as diet and exercise. What can I expect for my preventive care visit? Physical exam Your health care provider will check:  Height and weight. These may be used to calculate body mass index (BMI), which is a measurement that tells if you are at a healthy weight.  Heart rate and blood pressure.  Your skin for abnormal spots. Counseling Your health care provider may ask you questions about:  Alcohol, tobacco, and drug use.  Emotional well-being.  Home and relationship well-being.  Sexual activity.  Eating habits.  History of falls.  Memory and ability to understand (cognition).  Work and work Statistician.  Pregnancy and menstrual history. What immunizations do I need?  Influenza (flu) vaccine  This is recommended every year. Tetanus, diphtheria, and pertussis (Tdap) vaccine  You may need a Td booster every 10 years. Varicella (chickenpox) vaccine  You may need this vaccine if you have not already been vaccinated. Zoster (shingles) vaccine  You may need this after age 33. Pneumococcal conjugate (PCV13) vaccine  One dose is recommended after age 33. Pneumococcal polysaccharide (PPSV23) vaccine  One dose is recommended after age 72. Measles, mumps, and rubella (MMR) vaccine  You may need at least one dose of MMR if you were born in 1957 or later. You may also need a second dose. Meningococcal conjugate (MenACWY) vaccine  You may need this if you have certain conditions. Hepatitis A vaccine  You may need this if you have certain conditions or if you travel or work in places where you may be exposed  to hepatitis A. Hepatitis B vaccine  You may need this if you have certain conditions or if you travel or work in places where you may be exposed to hepatitis B. Haemophilus influenzae type b (Hib) vaccine  You may need this if you have certain conditions. You may receive vaccines as individual doses or as more than one vaccine together in one shot (combination vaccines). Talk with your health care provider about the risks and benefits of combination vaccines. What tests do I need? Blood tests  Lipid and cholesterol levels. These may be checked every 5 years, or more frequently depending on your overall health.  Hepatitis C test.  Hepatitis B test. Screening  Lung cancer screening. You may have this screening every year starting at age 39 if you have a 30-pack-year history of smoking and currently smoke or have quit within the past 15 years.  Colorectal cancer screening. All adults should have this screening starting at age 36 and continuing until age 15. Your health care provider may recommend screening at age 23 if you are at increased risk. You will have tests every 1-10 years, depending on your results and the type of screening test.  Diabetes screening. This is done by checking your blood sugar (glucose) after you have not eaten for a while (fasting). You may have this done every 1-3 years.  Mammogram. This may be done every 1-2 years. Talk with your health care provider about how often you should have regular mammograms.  BRCA-related cancer screening. This may be done if you have a family history of breast, ovarian, tubal, or peritoneal cancers.  Other tests  Sexually transmitted disease (STD) testing.  Bone density scan. This is done to screen for osteoporosis. You may have this done starting at age 44. Follow these instructions at home: Eating and drinking  Eat a diet that includes fresh fruits and vegetables, whole grains, lean protein, and low-fat dairy products. Limit  your intake of foods with high amounts of sugar, saturated fats, and salt.  Take vitamin and mineral supplements as recommended by your health care provider.  Do not drink alcohol if your health care provider tells you not to drink.  If you drink alcohol: ? Limit how much you have to 0-1 drink a day. ? Be aware of how much alcohol is in your drink. In the [place] one drink equals one 12 oz bottle of beer (355 mL), one 5 oz glass of wine (148 mL), or one 1 oz glass of hard liquor (44 mL). Lifestyle  Take daily care of your teeth and gums.  Stay active. Exercise for at least 30 minutes on 5 or more days each week.  Do not use any products that contain nicotine or tobacco, such as cigarettes, e-cigarettes, and chewing tobacco. If you need help quitting, ask your health care provider.  If you are sexually active, practice safe sex. Use a condom or other form of protection in order to prevent STIs (sexually transmitted infections).  Talk with your health care provider about taking a low-dose aspirin or statin. What's next?  Go to your health care provider once a year for a well check visit.  Ask your health care provider how often you should have your eyes and teeth checked.  Stay up to date on all vaccines. This information is not intended to replace advice given to you by your health care provider. Make sure you discuss any questions you have with your health care provider. Document Revised: 01/02/2018 Document Reviewed: 01/02/2018 Elsevier Patient Education  2020 [employer].

## 2019-05-18 LAB — HM DIABETES EYE EXAM

## 2019-05-20 ENCOUNTER — Inpatient Hospital Stay (HOSPITAL_BASED_OUTPATIENT_CLINIC_OR_DEPARTMENT_OTHER): Payer: HMO | Admitting: Family

## 2019-05-20 ENCOUNTER — Other Ambulatory Visit: Payer: Self-pay

## 2019-05-20 ENCOUNTER — Encounter: Payer: Self-pay | Admitting: Family

## 2019-05-20 ENCOUNTER — Inpatient Hospital Stay: Payer: HMO | Attending: Hematology & Oncology

## 2019-05-20 VITALS — BP 128/55 | HR 110 | Temp 97.5°F | Resp 20 | Ht 65.0 in | Wt 174.4 lb

## 2019-05-20 DIAGNOSIS — K922 Gastrointestinal hemorrhage, unspecified: Secondary | ICD-10-CM | POA: Diagnosis not present

## 2019-05-20 DIAGNOSIS — D6959 Other secondary thrombocytopenia: Secondary | ICD-10-CM | POA: Diagnosis not present

## 2019-05-20 DIAGNOSIS — R195 Other fecal abnormalities: Secondary | ICD-10-CM | POA: Diagnosis not present

## 2019-05-20 DIAGNOSIS — K7460 Unspecified cirrhosis of liver: Secondary | ICD-10-CM | POA: Diagnosis not present

## 2019-05-20 DIAGNOSIS — D500 Iron deficiency anemia secondary to blood loss (chronic): Secondary | ICD-10-CM | POA: Diagnosis not present

## 2019-05-20 DIAGNOSIS — R161 Splenomegaly, not elsewhere classified: Secondary | ICD-10-CM | POA: Diagnosis not present

## 2019-05-20 DIAGNOSIS — D649 Anemia, unspecified: Secondary | ICD-10-CM

## 2019-05-20 DIAGNOSIS — Z79899 Other long term (current) drug therapy: Secondary | ICD-10-CM | POA: Diagnosis not present

## 2019-05-20 LAB — CBC WITH DIFFERENTIAL (CANCER CENTER ONLY)
Abs Immature Granulocytes: 0.11 10*3/uL — ABNORMAL HIGH (ref 0.00–0.07)
Basophils Absolute: 0 10*3/uL (ref 0.0–0.1)
Basophils Relative: 1 %
Eosinophils Absolute: 0.2 10*3/uL (ref 0.0–0.5)
Eosinophils Relative: 5 %
HCT: 36.2 % (ref 36.0–46.0)
Hemoglobin: 11.9 g/dL — ABNORMAL LOW (ref 12.0–15.0)
Immature Granulocytes: 2 %
Lymphocytes Relative: 15 %
Lymphs Abs: 0.7 10*3/uL (ref 0.7–4.0)
MCH: 32.4 pg (ref 26.0–34.0)
MCHC: 32.9 g/dL (ref 30.0–36.0)
MCV: 98.6 fL (ref 80.0–100.0)
Monocytes Absolute: 0.3 10*3/uL (ref 0.1–1.0)
Monocytes Relative: 7 %
Neutro Abs: 3.5 10*3/uL (ref 1.7–7.7)
Neutrophils Relative %: 70 %
Platelet Count: 69 10*3/uL — ABNORMAL LOW (ref 150–400)
RBC: 3.67 MIL/uL — ABNORMAL LOW (ref 3.87–5.11)
RDW: 13 % (ref 11.5–15.5)
WBC Count: 4.9 10*3/uL (ref 4.0–10.5)
nRBC: 0 % (ref 0.0–0.2)

## 2019-05-20 LAB — CMP (CANCER CENTER ONLY)
ALT: 19 U/L (ref 0–44)
AST: 36 U/L (ref 15–41)
Albumin: 3.9 g/dL (ref 3.5–5.0)
Alkaline Phosphatase: 116 U/L (ref 38–126)
Anion gap: 6 (ref 5–15)
BUN: 20 mg/dL (ref 8–23)
CO2: 27 mmol/L (ref 22–32)
Calcium: 10.2 mg/dL (ref 8.9–10.3)
Chloride: 104 mmol/L (ref 98–111)
Creatinine: 1.07 mg/dL — ABNORMAL HIGH (ref 0.44–1.00)
GFR, Est AFR Am: 55 mL/min — ABNORMAL LOW (ref >60)
GFR, Estimated: 48 mL/min — ABNORMAL LOW (ref >60)
Glucose, Bld: 204 mg/dL — ABNORMAL HIGH (ref 70–99)
Potassium: 4.9 mmol/L (ref 3.5–5.1)
Sodium: 137 mmol/L (ref 135–145)
Total Bilirubin: 1.9 mg/dL — ABNORMAL HIGH (ref 0.3–1.2)
Total Protein: 6.4 g/dL — ABNORMAL LOW (ref 6.5–8.1)

## 2019-05-20 LAB — RETICULOCYTES
Immature Retic Fract: 17.2 % — ABNORMAL HIGH (ref 2.3–15.9)
RBC.: 3.63 MIL/uL — ABNORMAL LOW (ref 3.87–5.11)
Retic Count, Absolute: 126.3 10*3/uL (ref 19.0–186.0)
Retic Ct Pct: 3.5 % — ABNORMAL HIGH (ref 0.4–3.1)

## 2019-05-20 NOTE — Progress Notes (Signed)
Hematology and Oncology Follow Up Visit  Jessica Dennis [id number] 1932-10-24 86 y.o. 05/20/2019   Principle Diagnosis:  Chronic thrombocytopenia secondary to cirrhosis and splenomegaly Iron deficiency anemia secondary to GI bleed  Current Therapy:        IV iron as indicated Red blood cell transfusion as needed for variceal bleeding   Interim History:  Jessica Dennis is here today for follow-up. She is doing well and has no complaints at this time.  She has not noted any blood loss. No bruising or petechiae.  She has occasional fatigue and will take a break to rest when needed.  No fever, chills, n/v, cough, rash, dizziness, SOB, chest pain, palpitations, abdominal pain or changes in bowel or bladder habits.  No swelling or tenderness in her extremities.  The neuropathy in her feet is unchanged. No falls or syncopal episodes to report.  She states that she is eating well and staying properly hydrated. Her weight is stable.   ECOG Performance Status: 1 - Symptomatic but completely ambulatory  Medications:  Allergies as of 05/20/2019   No Known Allergies     Medication List       Accurate as of May 20, 2019  1:26 PM. If you have any questions, ask your nurse or doctor.        Accu-Chek FastClix Lancets Misc CHECK FOR BLOOD SUGAR DAILY   ammonium lactate 12 % cream Commonly known as: AMLACTIN Apply topically daily as needed.   CALCIUM CARBONATE-VITAMIN D PO Take by mouth. Calcium 1200mg   Vitamin D 5000 units  Pt also has a calcium 600mg  plus 800 units vitamin D supplement   Fish Oil 1200 MG Cpdr Take by mouth at bedtime.   gabapentin 100 MG capsule Commonly known as: NEURONTIN Take 1 capsule (100 mg total) by mouth 2 (two) times daily.   glucose blood test strip Accu-Chek Fastclix Lancet Drum   latanoprost 0.005 % ophthalmic solution Commonly known as: XALATAN Place 1 drop into both eyes at bedtime.   levothyroxine 75 MCG tablet Commonly known as:  SYNTHROID TAKE ONE TABLET BY MOUTH DAILY   LORazepam 0.5 MG tablet Commonly known as: ATIVAN Take 1 tablet (0.5 mg total) by mouth daily as needed.   Melatonin 5 MG Caps Take 5 mg by mouth at bedtime as needed.   NOVOLOG MIX 70/30 [place] Inject into the skin. 35 units in the mornings and 25 at night   pantoprazole 40 MG tablet Commonly known as: PROTONIX TAKE ONE TABLET BY MOUTH DAILY - NEEDS OFFICE VISIT   Prednicarbate 0.1 % Crea Apply topically 2 (two) times daily.   simvastatin 20 MG tablet Commonly known as: ZOCOR TAKE ONE TABLET BY MOUTH AT BEDTIME - NEEDS OFFICE VISIT   spironolactone 50 MG tablet Commonly known as: ALDACTONE Take 100 mg by mouth daily.   SSD 1 % cream Generic drug: silver sulfADIAZINE 1 application daily.   tiZANidine 4 MG tablet Commonly known as: ZANAFLEX TAKE ONE TABLET BY MOUTH EVERY NIGHT AT BEDTIME AS NEEDED MUSCLE SPASMS   vitamin C 1000 MG tablet Take 1,000 mg by mouth daily.   vitamin E 180 MG (400 UNITS) capsule Take by mouth daily.       Allergies: No Known Allergies  Past Medical History, Surgical history, Social history, and Family History were reviewed and updated.  Review of Systems: All other 10 point review of systems is negative.   Physical Exam:  vitals were not taken for this visit.   Wt Readings  from Last 3 Encounters:  04/28/19 172 lb 6.4 oz (78.2 kg)  04/08/19 168 lb 12.8 oz (76.6 kg)  02/11/19 166 lb 12.8 oz (75.7 kg)    Ocular: Sclerae unicteric, pupils equal, round and reactive to light Ear-nose-throat: Oropharynx clear, dentition fair Lymphatic: No cervical or supraclavicular adenopathy Lungs no rales or rhonchi, good excursion bilaterally Heart regular rate and rhythm, no murmur appreciated Abd soft, nontender, positive bowel sounds, no liver or spleen tip palpated on exam, no fluid wave  MSK no focal spinal tenderness, no joint edema Neuro: non-focal, well-oriented, appropriate affect Breasts:  Deferred   Lab Results  Component Value Date   WBC 4.1 04/28/2019   HGB 11.8 (L) 04/28/2019   HCT 34.8 (L) 04/28/2019   MCV 100.1 (H) 04/28/2019   PLT 51.0 Repeated and verified X2. (L) 04/28/2019   Lab Results  Component Value Date   FERRITIN 67 04/08/2019   IRON 138 04/08/2019   TIBC 258 04/08/2019   UIBC 120 04/08/2019   IRONPCTSAT 53 04/08/2019   Lab Results  Component Value Date   RETICCTPCT 3.4 (H) 04/08/2019   RBC 3.47 (L) 04/28/2019   No results found for: KPAFRELGTCHN, LAMBDASER, KAPLAMBRATIO No results found for: IGGSERUM, IGA, IGMSERUM No results found for: Kathrynn Ducking, MSPIKE, SPEI   Chemistry      Component Value Date/Time   NA 134 (L) 04/28/2019 1139   NA 138 12/18/2016 1314   NA 139 03/05/2016 1139   K 4.0 04/28/2019 1139   K 4.3 12/18/2016 1314   K 3.6 03/05/2016 1139   CL 102 04/28/2019 1139   CL 101 12/18/2016 1314   CO2 25 04/28/2019 1139   CO2 28 12/18/2016 1314   CO2 26 03/05/2016 1139   BUN 21 04/28/2019 1139   BUN 33 (H) 12/18/2016 1314   BUN 14.3 03/05/2016 1139   CREATININE 1.02 04/28/2019 1139   CREATININE 1.12 (H) 04/08/2019 1323   CREATININE 1.06 (H) 02/14/2018 1544   CREATININE 0.7 03/05/2016 1139      Component Value Date/Time   CALCIUM 9.2 04/28/2019 1139   CALCIUM 10.0 12/18/2016 1314   CALCIUM 9.3 03/05/2016 1139   ALKPHOS 121 (H) 04/28/2019 1139   ALKPHOS 94 (H) 12/18/2016 1314   ALKPHOS 127 03/05/2016 1139   AST 33 04/28/2019 1139   AST 43 (H) 04/08/2019 1323   AST 29 03/05/2016 1139   ALT 20 04/28/2019 1139   ALT 24 04/08/2019 1323   ALT 26 12/18/2016 1314   ALT 22 03/05/2016 1139   BILITOT 2.1 (H) 04/28/2019 1139   BILITOT 2.0 (H) 04/08/2019 1323   BILITOT 1.75 (H) 03/05/2016 1139       Impression and Plan: Jessica Dennis is a very pleasant44 yo female with chronic thrombocytopenia and iron deficiency anemia secondary to intermittent GI blood loss. We will see what her  iron studies show and bring her back in for replacement if needed.  We will plan to see her back in another 8 weeks.  She will contact our office with any questions or concerns. We can certainly see her sooner if needed.   [name], NP 4/28/20211:26 PM

## 2019-05-21 ENCOUNTER — Other Ambulatory Visit: Payer: Self-pay | Admitting: Family Medicine

## 2019-05-21 DIAGNOSIS — K219 Gastro-esophageal reflux disease without esophagitis: Secondary | ICD-10-CM

## 2019-05-21 LAB — FERRITIN: Ferritin: 55 ng/mL (ref 11–307)

## 2019-05-21 LAB — IRON AND TIBC
Iron: 191 ug/dL — ABNORMAL HIGH (ref 41–142)
Saturation Ratios: 65 % — ABNORMAL HIGH (ref 21–57)
TIBC: 295 ug/dL (ref 236–444)
UIBC: 104 ug/dL — ABNORMAL LOW (ref 120–384)

## 2019-05-27 ENCOUNTER — Other Ambulatory Visit: Payer: Self-pay | Admitting: Family Medicine

## 2019-05-27 DIAGNOSIS — E785 Hyperlipidemia, unspecified: Secondary | ICD-10-CM

## 2019-05-27 DIAGNOSIS — E1169 Type 2 diabetes mellitus with other specified complication: Secondary | ICD-10-CM

## 2019-05-27 DIAGNOSIS — K219 Gastro-esophageal reflux disease without esophagitis: Secondary | ICD-10-CM

## 2019-05-29 ENCOUNTER — Other Ambulatory Visit: Payer: Self-pay | Admitting: Family Medicine

## 2019-05-29 DIAGNOSIS — R252 Cramp and spasm: Secondary | ICD-10-CM

## 2019-06-02 ENCOUNTER — Other Ambulatory Visit: Payer: Self-pay | Admitting: *Deleted

## 2019-06-02 ENCOUNTER — Other Ambulatory Visit: Payer: Self-pay | Admitting: Family Medicine

## 2019-06-02 DIAGNOSIS — F411 Generalized anxiety disorder: Secondary | ICD-10-CM

## 2019-06-02 NOTE — Patient Outreach (Signed)
  [facility]) Care Management Chronic Special Needs Program    06/02/2019  Name: Cary Vandervort, DOB: [birth year]  MRN: [id number]   Ms. Lashala Fohl is enrolled in a chronic special needs plan for Diabetes. Outreach call to client for initial telephone assessment,spoke with client and introduced CSNP program, client states " it's not a good day for me to talk"  Client requests call back another day.  PLAN Outreach client in 2-3 weeks  [name] [facility], [place] Coordinator [phone]

## 2019-06-03 NOTE — Telephone Encounter (Signed)
Last Lorazepam RX:  10/27/18, #90 x 1 RF Last OV:  04/28/19 Next OV: 10/29/19 UDS: 04/26/17, past due CSC:  04/26/17, past due

## 2019-06-19 ENCOUNTER — Other Ambulatory Visit: Payer: Self-pay | Admitting: *Deleted

## 2019-06-19 NOTE — Patient Outreach (Signed)
[facility]) Care Management Chronic Special Needs Program  06/19/2019  Name: Jessica Dennis DOB: 01/19/1933  MRN: [id number]  Jessica Dennis is enrolled in a chronic special needs plan for Diabetes. Chronic Care Management Coordinator telephoned client to review health risk assessment and to develop individualized care plan.  Introduced the chronic care management program and importance of client participation.  Subjective: Client states "this is not a good day to talk"  Client states she is not interested in talking on the phone about CSNP program but is receptive to receiving the education materials and care plan. RN care manager updated individualized care plan which does not include a personal care goal due to client's non-participation.   Goals Addressed            This Visit's Progress   . Client understands the importance of follow-up with providers by attending scheduled visits       Continue to follow up with primary care provider and specialists. Use the 24 hour nurse advice line as needed at [phone]    . Client will use Assistive Devices as needed and verbalize understanding of device use       RN care manager mailed EMMI education article " How to use a cane"     . Client will verbalize knowledge of self management of Hypertension as evidences by BP reading of 140/90 or less; or as defined by provider       Take blood pressure medications as prescribed. Plan to check blood pressure regularly and take results to your health care provider appointments. Plan to follow a low salt diet. Increase activity as tolerated. Follow up with your health care provider as recommended. Please ask your health care provider " what is my target blood pressure range". RN care manager mailed EMMI education article " Diabetes and blood pressure"     . COMPLETED: HEMOGLOBIN A1C < 7       Per medical record review Hgb AIC completed on 03/18/18    6.3 Diabetes self  management actions as follows- Continue to keep your follow up appointments with your provider and have lab work completed as recommended. Monitor glucose (blood sugar) per health care provider recommendation. Check feet daily Visit health care provider every 3-6 months as directed. It is important to have your Hgb AIC checked every 3-6 months (every 6 months if you are at goal and every 3 months if you are not at goal). Eye exam yearly Carbohydrate controlled meal planning Take diabetes medications as prescribed by health care provider Physical activity RN care manager mailed EMMI education article " Diabetes care check list"     . Maintain timely refills of diabetic medication as prescribed within the year .       Take medications as prescribed. Follow up with your health care provider if you have any questions. Please contact your assigned RN care manager if you have difficulty obtaining medications.      . Obtain annual  Lipid Profile, LDL-C       Per medical record review, Lipid profile completed on 10/27/18 LDL= 47 The goal for LDL is less than 70mg /dl as you are at high risk for complications. Try to avoid saturated fats, trans-fats and eat more fiber. Continue to follow up with your health care provider for any needed lab work.      . Obtain Annual Foot Exam   On track    Per medical record review, foot exam completed on 09/16/18 Your  doctor should check your feet at least once a year. Plan to schedule a foot exam with your health care provider once every year. Check your skin and feet daily for cuts, bruises, redness, blisters or sore.     . Obtain annual screen for micro albuminuria (urine) , nephropathy (kidney problems)       Per medical record review, microalbuminuria completed on 10/27/18 Continue to obtain yearly physicals and lab checks as recommended by your health care provider. It is important for your health care provider to check your urine for protein at  least once every year.     . Obtain Hemoglobin A1C at least 2 times per year       AIC checked 03/18/18    . Visit Primary Care Provider or Endocrinologist at least 2 times per year        Review of medical record indicates client has completed  2 office visits with primary care provider in 2020 Call and schedule a yearly physical and follow up visit as recommended by your health care provider.        Plan:    Client does not want to participate telephonically, RN care manager faxed today's note and  updated individualized care plan to primary care provider and mailed updated individualized care plan to client along with education materials.  Chronic care management coordination will outreach in:  10-12 months  Kassie Mends Nursing/RN [place] Case Manager, C-SNP [phone]

## 2019-07-11 ENCOUNTER — Other Ambulatory Visit: Payer: Self-pay | Admitting: Internal Medicine

## 2019-07-20 ENCOUNTER — Inpatient Hospital Stay: Payer: HMO | Admitting: Family

## 2019-07-20 ENCOUNTER — Inpatient Hospital Stay: Payer: HMO | Attending: Hematology & Oncology

## 2019-07-24 ENCOUNTER — Other Ambulatory Visit (INDEPENDENT_AMBULATORY_CARE_PROVIDER_SITE_OTHER): Payer: HMO

## 2019-07-24 ENCOUNTER — Encounter: Payer: Self-pay | Admitting: Internal Medicine

## 2019-07-24 ENCOUNTER — Ambulatory Visit: Payer: HMO | Admitting: Internal Medicine

## 2019-07-24 VITALS — BP 110/64 | HR 112 | Ht 65.25 in | Wt 174.1 lb

## 2019-07-24 DIAGNOSIS — K766 Portal hypertension: Secondary | ICD-10-CM

## 2019-07-24 DIAGNOSIS — K3189 Other diseases of stomach and duodenum: Secondary | ICD-10-CM

## 2019-07-24 DIAGNOSIS — K219 Gastro-esophageal reflux disease without esophagitis: Secondary | ICD-10-CM

## 2019-07-24 DIAGNOSIS — K7031 Alcoholic cirrhosis of liver with ascites: Secondary | ICD-10-CM | POA: Diagnosis not present

## 2019-07-24 LAB — PROTIME-INR
INR: 1.3 ratio — ABNORMAL HIGH (ref 0.8–1.0)
Prothrombin Time: 14.1 s — ABNORMAL HIGH (ref 9.6–13.1)

## 2019-07-24 MED ORDER — FUROSEMIDE 20 MG PO TABS: 20.0000 mg | ORAL_TABLET | Freq: Every day | ORAL | 3 refills | Status: DC

## 2019-07-24 MED ORDER — SPIRONOLACTONE 50 MG PO TABS: 100.0000 mg | ORAL_TABLET | Freq: Every day | ORAL | 3 refills | Status: DC

## 2019-07-24 MED ORDER — PANTOPRAZOLE SODIUM 40 MG PO TBEC: 40.0000 mg | DELAYED_RELEASE_TABLET | Freq: Every day | ORAL | 3 refills | Status: DC

## 2019-07-24 NOTE — Progress Notes (Signed)
Kamariyah Timberlake 86 y.o. 1932/11/10 [id number]  Assessment & Plan:   Alcoholic cirrhosis of liver with ascites ([place]) Check an INR today.  Screen for varices again.  She does not want to do ultrasound screening for hepatocellular carcinoma.  Refill diuretics.  Portal hypertensive gastropathy (HCC) Follow-up EGD  Acid reflux Continue PPI   I appreciate the opportunity to care for this patient. CC: Ann Held, DO   Subjective:   Chief Complaint: Follow-up of cirrhosis  HPI Trudi is here for follow-up of her cirrhosis.  She reports she is doing well no significant fluid retention on her diuretics.  Having knee pain but otherwise getting along okay.  Remains abstinent.  Last visit was January 2020.  She has not yet had a follow-up EGD for screening for esophageal varices she continues to be followed for iron deficiency anemia which could be from gave versus portal hypertension, by Dr. Marin Olp.  Labs in April are reviewed in the chart.  No INR. No Known Allergies Current Meds  Medication Sig  . Accu-Chek FastClix Lancets MISC CHECK FOR BLOOD SUGAR DAILY  . ammonium lactate (AMLACTIN) 12 % cream Apply topically daily as needed.   . Ascorbic Acid (VITAMIN C) 1000 MG tablet Take 1,000 mg by mouth daily.  Marland Kitchen CALCIUM CARBONATE-VITAMIN D PO Take by mouth. Calcium 1200mg   Vitamin D 5000 units  Pt also has a calcium 600mg  plus 800 units vitamin D supplement  . furosemide (LASIX) 20 MG tablet Take 1 tablet by mouth daily.  Marland Kitchen gabapentin (NEURONTIN) 100 MG capsule TAKE ONE CAPSULE BY MOUTH TWICE A DAY  . gentamicin ointment (GARAMYCIN) 0.1 % Apply 1 application topically as needed.   Marland Kitchen glucose blood test strip Accu-Chek [employer]  . Insulin Aspart Prot & Aspart (NOVOLOG MIX 70/30 [place]) Inject into the skin. 35 units in the mornings and 25 at night  . KLOR-CON M20 20 MEQ tablet Take 1 tablet by mouth daily.  Marland Kitchen latanoprost (XALATAN) 0.005 % ophthalmic solution Place 1  drop into both eyes at bedtime.   Marland Kitchen levothyroxine (SYNTHROID) 75 MCG tablet TAKE ONE TABLET BY MOUTH DAILY  . LORazepam (ATIVAN) 0.5 MG tablet TAKE ONE TABLET BY MOUTH DAILY AS NEEDED  . meloxicam (MOBIC) 7.5 MG tablet Take 1 tablet by mouth daily.  . Omega-3 Fatty Acids (FISH OIL) 1200 MG CPDR Take by mouth at bedtime.   . pantoprazole (PROTONIX) 40 MG tablet TAKE ONE TABLET BY MOUTH DAILY  . Prednicarbate 0.1 % CREA Apply topically 2 (two) times daily.   . simvastatin (ZOCOR) 20 MG tablet TAKE ONE TABLET BY MOUTH EVERY NIGHT AT BEDTIME  . spironolactone (ALDACTONE) 50 MG tablet TAKE TWO TABLETS BY MOUTH DAILY  . SSD 1 % cream 1 application daily.   Marland Kitchen tiZANidine (ZANAFLEX) 4 MG tablet TAKE ONE TABLET BY MOUTH EVERY NIGHT AT BEDTIME AS NEEDED MUSCLE SPASMS  . vitamin E 180 MG (400 UNITS) capsule Take by mouth daily.    Past Medical History:  Diagnosis Date  . Anemia   . Angiodysplasia of ascending colon 10/25/2014  . Anxiety   . Arthritis   . Borderline diabetes   . Cancer of the skin, basal cell 09/03/2012  . Cirrhosis ([place])   . Diabetes mellitus without complication ([place])   . Diverticular disease   . 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 GERD (gastroesophageal reflux disease)   . Hypertension   . Iron deficiency anemia due to chronic blood loss 01/19/2015  .  Portal hypertensive gastropathy ([place]) 08/02/2016   ? Some GAVE also  . Thyroid disease    Past Surgical History:  Procedure Laterality Date  . ABDOMINAL HYSTERECTOMY    . APPENDECTOMY    . COLONOSCOPY    . ESOPHAGOGASTRODUODENOSCOPY    . IR PARACENTESIS  05/25/2016  . LEG SKIN LESION  BIOPSY / EXCISION  12/11/14  . MOHS SURGERY     ankle  . TONSILLECTOMY    . TOTAL HIP ARTHROPLASTY Bilateral 1993, 2006  . UPPER GASTROINTESTINAL ENDOSCOPY  09/09/2017   Social History   Social History Narrative   The patient is divorced. She is originally from [place]. She has 2 sons. 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   08/10/2014   family history includes Bladder Cancer in her father; Diabetes in her mother; Heart attack in her father.   Review of Systems   Objective:   Physical Exam BP 110/64 (BP Location: Left Arm, Patient Position: Sitting, Cuff Size: Normal)   Pulse (!) 112   Ht 5' 5.25" (1.657 m) Comment: height measured without shoes  Wt 174 lb 2 oz (79 kg)   BMI 28.75 kg/m  NAD Wide based gait Anicteric Lungs cta Cor Nl  abd soft NT no HSM/mass  Ext tr edema bilat left > right Neuro alert and oriented x 3 w/ no asterixis

## 2019-07-24 NOTE — Assessment & Plan Note (Signed)
Follow up EGD

## 2019-07-24 NOTE — Assessment & Plan Note (Signed)
Continue PPI ?

## 2019-07-24 NOTE — Patient Instructions (Signed)
Your provider has requested that you go to the basement level for lab work before leaving today. Press "B" on the elevator. The lab is located at the first door on the left as you exit the elevator.   We have sent the following medications to your pharmacy for you to pick up at your convenience: furosemide, spironolactone and pantoprazole.   You have been scheduled for an endoscopy. Please follow written instructions given to you at your visit today. If you use inhalers (even only as needed), please bring them with you on the day of your procedure.  I appreciate the opportunity to care for you.  Silvano Rusk, MD-FACG

## 2019-07-24 NOTE — Assessment & Plan Note (Signed)
Check an INR today.  Screen for varices again.  She does not want to do ultrasound screening for hepatocellular carcinoma.  Refill diuretics.

## 2019-08-14 ENCOUNTER — Encounter: Payer: HMO | Admitting: Internal Medicine

## 2019-08-18 ENCOUNTER — Telehealth: Payer: Self-pay | Admitting: Family Medicine

## 2019-08-18 NOTE — Progress Notes (Signed)
  Chronic Care Management   Note  08/18/2019 Name: Mikahla Wisor MRN: [id number] DOB: [birth year]  Nayah Lukens is a 86 y.o. year old female who is a primary care patient of Ann Held, DO. I reached out to Octavio Graves by phone today in response to a referral sent by Ms. Hughie Closs PCP, Ann Held, DO.   Ms. Ayon was given information about Chronic Care Management services today including:  1. CCM service includes personalized support from designated clinical staff supervised by her physician, including individualized plan of care and coordination with other care providers 2. 24/7 contact phone numbers for assistance for urgent and routine care needs. 3. Service will only be billed when office clinical staff spend 20 minutes or more in a month to coordinate care. 4. Only one practitioner may furnish and bill the service in a calendar month. 5. The patient may stop CCM services at any time (effective at the end of the month) by phone call to the office staff.   Patient agreed to services and verbal consent obtained.   Follow up plan:   Carley Perdue [employer] Scheduler

## 2019-08-25 ENCOUNTER — Other Ambulatory Visit: Payer: Self-pay | Admitting: Family Medicine

## 2019-08-25 DIAGNOSIS — E1169 Type 2 diabetes mellitus with other specified complication: Secondary | ICD-10-CM

## 2019-08-25 DIAGNOSIS — E785 Hyperlipidemia, unspecified: Secondary | ICD-10-CM

## 2019-09-14 ENCOUNTER — Other Ambulatory Visit: Payer: Self-pay | Admitting: Family Medicine

## 2019-09-14 DIAGNOSIS — R252 Cramp and spasm: Secondary | ICD-10-CM

## 2019-09-14 DIAGNOSIS — F411 Generalized anxiety disorder: Secondary | ICD-10-CM

## 2019-09-14 NOTE — Telephone Encounter (Signed)
Lorazepam refill.   Last OV: 04/28/2019 Last Fill: 06/03/2019 #90 and 0rf Pt sig: 1 tab qd prn UDS: 04/26/2017 Low risk

## 2019-09-25 ENCOUNTER — Telehealth: Payer: HMO

## 2019-09-25 ENCOUNTER — Ambulatory Visit: Payer: Self-pay | Admitting: Pharmacist

## 2019-09-25 NOTE — Chronic Care Management (AMB) (Signed)
  Chronic Care Management   Outreach Note  09/25/2019 Name: Jessica Dennis MRN: [id number] DOB: [birth year]  Referred by: Ann Held, DO Reason for referral : No chief complaint on file.  Patient was scheduled for her 6 month CCM Follow up.  Reviewed patient's chart and updated her care plan accordingly.  Third unsuccessful telephone outreach was attempted today. The patient was referred to the pharmacist for assistance with care management and care coordination.   Follow Up Plan:  -Attempt to reschedule patient before the end of the month  Melvenia Beam Day, PharmD Clinical Pharmacist [facility] [phone]

## 2019-09-25 NOTE — Chronic Care Management (AMB) (Deleted)
Chronic Care Management Pharmacy  Name: Jessica Dennis  MRN: [id number] DOB: 26-Oct-1932  Chief Complaint/ HPI  Jessica Dennis,  86 y.o. , female presents for their Initial CCM visit with the clinical pharmacist via telephone due to COVID-19 Pandemic.  PCP : Ann Held, DO  Their chronic conditions include: DM, HTN, HLD, Hypothyroidism, GERD, Neuropathy, Insomnia  Office Visits: 04/28/19: Visit w/ Dr. Etter Sjogren - Referral to neuro for tremor. Referral to derm for lesion on skin and nose. No med changes noted.   Consult Visit: 07/24/19: [name] visit w/ Dr. Carlean Purl - No med changes noted  05/20/19: Hem/Onc visit w/ [name], NP - No med changes noted  Medications: Outpatient Encounter Medications as of 09/25/2019  Medication Sig Note  . LORazepam (ATIVAN) 0.5 MG tablet TAKE ONE TABLET BY MOUTH DAILY AS NEEDED   . Accu-Chek FastClix Lancets MISC CHECK FOR BLOOD SUGAR DAILY   . ammonium lactate (AMLACTIN) 12 % cream Apply topically daily as needed.    . Ascorbic Acid (VITAMIN C) 1000 MG tablet Take 1,000 mg by mouth daily.   Marland Kitchen CALCIUM CARBONATE-VITAMIN D PO Take by mouth. Calcium 1237m  Vitamin D 5000 units  Pt also has a calcium 6071mplus 800 units vitamin D supplement   . furosemide (LASIX) 20 MG tablet Take 1 tablet (20 mg total) by mouth daily.   . Marland Kitchenabapentin (NEURONTIN) 100 MG capsule Take 1 capsule (100 mg total) by mouth 2 (two) times daily.   . Marland Kitchenentamicin ointment (GARAMYCIN) 0.1 % Apply 1 application topically as needed.    . Marland Kitchenlucose blood test strip Accu-Chek [employer] . Insulin Aspart Prot & Aspart (NOVOLOG MIX 70/30 [place]) Inject into the skin. 35 units in the mornings and 25 at night   . KLOR-CON M20 20 MEQ tablet Take 1 tablet by mouth daily.   . Marland Kitchenatanoprost (XALATAN) 0.005 % ophthalmic solution Place 1 drop into both eyes at bedtime.    . Marland Kitchenevothyroxine (SYNTHROID) 75 MCG tablet TAKE ONE TABLET BY MOUTH DAILY   . meloxicam (MOBIC) 7.5 MG tablet  Take 1 tablet by mouth daily.   . Omega-3 Fatty Acids (FISH OIL) 1200 MG CPDR Take by mouth at bedtime.    . pantoprazole (PROTONIX) 40 MG tablet Take 1 tablet (40 mg total) by mouth daily.   . Prednicarbate 0.1 % CREA Apply topically 2 (two) times daily.    . simvastatin (ZOCOR) 20 MG tablet TAKE ONE TABLET BY MOUTH EVERY NIGHT AT BEDTIME   . spironolactone (ALDACTONE) 50 MG tablet Take 2 tablets (100 mg total) by mouth daily.   . Marland KitchenSD 1 % cream 1 application daily.  04/13/2019: Uses PRN  . tiZANidine (ZANAFLEX) 4 MG tablet TAKE ONE TABLET BY MOUTH EVERY NIGHT AT BEDTIME AS NEEDED MUSCLE SPASMS 04/13/2019: Patient doesn't remember receiving   . vitamin E 180 MG (400 UNITS) capsule Take by mouth daily.     No facility-administered encounter medications on file as of 09/25/2019.   SDOH Screenings   Alcohol Screen:   . Last Alcohol Screening Score (AUDIT): Not on file  Depression (PHQ2-9): Low Risk   . PHQ-2 Score: 0  Financial Resource Strain:   . Difficulty of Paying Living Expenses: Not on file  Food Insecurity:   . Worried About RuCharity fundraisern the Last Year: Not on file  . Ran Out of Food in the Last Year: Not on file  Housing:   . Last Housing Risk Score:  Not on file  Physical Activity:   . Days of Exercise per Week: Not on file  . Minutes of Exercise per Session: Not on file  Social Connections:   . Frequency of Communication with Friends and Family: Not on file  . Frequency of Social Gatherings with Friends and Family: Not on file  . Attends Religious Services: Not on file  . Active Member of Clubs or Organizations: Not on file  . Attends Archivist Meetings: Not on file  . Marital Status: Not on file  Stress:   . Feeling of Stress : Not on file  Tobacco Use: Low Risk   . Smoking Tobacco Use: Never Smoker  . Smokeless Tobacco Use: Never Used  Transportation Needs:   . Film/video editor (Medical): Not on file  . Lack of Transportation (Non-Medical): Not  on file     Current Diagnosis/Assessment:  Goals Addressed            This Visit's Progress   . Chronic Care Management Pharmacy Care Plan       CARE PLAN ENTRY (see longitudinal plan of care for additional care plan information)  Current Barriers:  . Chronic Disease Management support, education, and care coordination needs related to DM, HTN, HLD, Hypothyroidism, GERD, Neuropathy, Insomnia   Hypertension BP Readings from Last 3 Encounters:  07/24/19 110/64  05/20/19 (!) 128/55  04/28/19 118/60   . Pharmacist Clinical Goal(s): o Over the next 180 days, patient will work with PharmD and providers to maintain BP goal <140/90 . Current regimen:  o Spironolactone 1m #2 daily . Patient self care activities - Over the next 180 days, patient will: o Maintain hypertension medication regimen.   Hyperlipidemia Lab Results  Component Value Date/Time   LDLCALC 43 04/28/2019 11:39 AM   LDLCALC 57 02/14/2018 03:44 PM   . Pharmacist Clinical Goal(s): o Over the next 180 days, patient will work with PharmD and providers to maintain LDL goal < 100 . Current regimen:   Simvastatin 278mdaily  Fish oil 120062maily . Patient self care activities - Over the next 180 days, patient will: o Maintain cholesterol medication regimen.   Diabetes Lab Results  Component Value Date/Time   HGBA1C 6.3 04/28/2019 11:39 AM   HGBA1C 6.3 (H) 03/18/2018 10:40 AM   . Pharmacist Clinical Goal(s): o Over the next 180 days, patient will work with PharmD and providers to maintain A1c goal <7% . Current regimen:  o Novolog 70/30 35 units AM +25 units PM . Interventions: o Discussed DM goals - Fasting (before meal blood sugar): 80-130 - Post-Prandial (2 hrs after eating blood sugar): less than 180 - A1c (3 month blood sugar average): less than 7% . Patient self care activities - Over the next 180 days, patient will: o Maintain a1c <7%  Health Maintenance  . Pharmacist Clinical  Goal(s) o Over the next 180 days, patient will work with PharmD and providers to complete health maintenance screenings/vaccinations . Interventions: o Consider completing Shingrix vaccine series at the pharmacy . Patient self care activities - Over the next 180 days, patient will: o Consider completing Shingrix vaccine series at the pharmacy  Medication management . Pharmacist Clinical Goal(s): o Over the next 180 days, patient will work with PharmD and providers to maintain optimal medication adherence . [facility] o Comprehensive medication review performed. o Continue current medication management strategy . Patient self care activities - Over the next 180 days, patient will: o  Focus on medication adherence by filling and taking medications appropriately  o Take medications as prescribed o Report any questions or concerns to PharmD and/or provider(s)  Please see past updates related to this goal by clicking on the "Past Updates" button in the selected goal        Social: Moved here from [place] in 1969   Diabetes   A1c goal <7%   Followed by Dr. Chalmers Cater  Recent Relevant Labs: Lab Results  Component Value Date/Time   HGBA1C 6.3 04/28/2019 11:39 AM   HGBA1C 6.3 (H) 03/18/2018 10:40 AM   GFR 51.59 (L) 04/28/2019 11:39 AM   GFR 46.85 (L) 10/27/2018 02:43 PM   MICROALBUR 1.6 04/28/2019 11:39 AM   MICROALBUR 1.0 10/27/2018 02:43 PM    Last diabetic Eye exam:  Lab Results  Component Value Date/Time   HMDIABEYEEXA No Retinopathy 05/18/2019 12:00 AM    Last diabetic Foot exam: No results found for: HMDIABFOOTEX   From 04/13/19 Visit Recent FBG Readings:  199 207 207 188 168 176 196 148 192 200  Average 188.1  Checking BG: {CHL HP Blood Glucose Monitoring Frequency:[phone]}   Recent FBG Readings: *** Recent pre-meal BG readings: *** Recent 2hr PP BG readings:  *** Recent HS BG readings: ***  Patient has  failed these meds in past: metformin (GFR? Toleration?) Patient is currently controlled on the following medications:  Novolog 70/30 35 units AM +25 units PM  Update 09/25/19 A1c remained stable  We discussed: {CHL HP Upstream Pharmacy [phone]}  Plan -Continue current medications   Hypertension   BP goal is:  <140/90  Office blood pressures are  BP Readings from Last 3 Encounters:  07/24/19 110/64  05/20/19 (!) 128/55  04/28/19 118/60   Patient checks BP at home Never, does not have BP cuff Patient home BP readings are ranging: Unable to assess  Patient has failed these meds in the past: quinapril (listed in D/C meds. No apparent reason for D/C)  Patient is currently controlled on the following medications:  . Spironolactone 95m #2 daily  Update 09/25/19  Plan -Continue current medications   Hyperlipidemia   LDL goal <100  Lipid Panel     Component Value Date/Time   CHOL 124 04/28/2019 1139   TRIG 71.0 04/28/2019 1139   HDL 66.90 04/28/2019 1139   LDLCALC 43 04/28/2019 1139   LDLCALC 57 02/14/2018 1544    Hepatic Function Latest Ref Rng & Units 05/20/2019 04/28/2019 04/08/2019  Total Protein 6.5 - 8.1 g/dL 6.4(L) 5.8(L) 6.3(L)  Albumin 3.5 - 5.0 g/dL 3.9 3.7 4.1  AST 15 - 41 U/L 36 33 43(H)  ALT 0 - 44 U/L 19 20 24   Alk Phosphatase 38 - 126 U/L 116 121(H) 119  Total Bilirubin 0.3 - 1.2 mg/dL 1.9(H) 2.1(H) 2.0(H)     The ASCVD Risk score (Mikey BussingDC Jr., et al., 2013) failed to calculate for the following reasons:   The 2013 ASCVD risk score is only valid for ages 444to 728  Patient has failed these meds in past: None noted  Patient is currently controlled on the following medications:   Simvastatin 261mdaily  Fish oil 120050maily  Update 09/25/19 LDL remained stable and slightly lower (10/27/18 - 47)  Plan -Continue current medications   Vaccines   Reviewed and discussed patient's vaccination history.    Immunization History   Administered Date(s) Administered  . Fluad Quad(high Dose 65+) 10/27/2018  . Hepatitis B, adult 05/12/2014  . Hepatitis B, ped/adol  08/10/2014  . Influenza Split 11/02/2009, 10/15/2010  . Influenza, High Dose Seasonal PF 10/13/2014, 01/26/2017  . Influenza, Seasonal, Injecte, Preservative Fre 10/13/2014  . Influenza,inj,quad, With Preservative 11/22/2016  . Influenza-Unspecified 11/25/2013, 10/06/2015, 10/22/2017  . Moderna SARS-COVID-2 Vaccination 03/26/2019, 04/23/2019  . Pneumococcal Conjugate-13 05/17/2014  . Pneumococcal Polysaccharide-23 10/06/2015  . Tdap 10/02/2011, 11/06/2016  . Zoster 01/22/2005    Plan -Recommended patient receive Shingrix vaccine in pharmacy.

## 2019-09-25 NOTE — Patient Instructions (Signed)
Visit Information  Goals Addressed            This Visit's Progress   . Chronic Care Management Pharmacy Care Plan       CARE PLAN ENTRY (see longitudinal plan of care for additional care plan information)  Current Barriers:  . Chronic Disease Management support, education, and care coordination needs related to DM, HTN, HLD, Hypothyroidism, GERD, Neuropathy, Insomnia   Hypertension BP Readings from Last 3 Encounters:  07/24/19 110/64  05/20/19 (!) 128/55  04/28/19 118/60   . Pharmacist Clinical Goal(s): o Over the next 180 days, patient will work with PharmD and providers to maintain BP goal <140/90 . Current regimen:  o Spironolactone 50mg  #2 daily . Patient self care activities - Over the next 180 days, patient will: o Maintain hypertension medication regimen.   Hyperlipidemia Lab Results  Component Value Date/Time   LDLCALC 43 04/28/2019 11:39 AM   LDLCALC 57 02/14/2018 03:44 PM   . Pharmacist Clinical Goal(s): o Over the next 180 days, patient will work with PharmD and providers to maintain LDL goal < 100 . Current regimen:   Simvastatin 20mg  daily  Fish oil 1200mg  daily . Patient self care activities - Over the next 180 days, patient will: o Maintain cholesterol medication regimen.   Diabetes Lab Results  Component Value Date/Time   HGBA1C 6.3 04/28/2019 11:39 AM   HGBA1C 6.3 (H) 03/18/2018 10:40 AM   . Pharmacist Clinical Goal(s): o Over the next 180 days, patient will work with PharmD and providers to maintain A1c goal <7% . Current regimen:  o Novolog 70/30 35 units AM +25 units PM . Interventions: o Discussed DM goals - Fasting (before meal blood sugar): 80-130 - Post-Prandial (2 hrs after eating blood sugar): less than 180 - A1c (3 month blood sugar average): less than 7% . Patient self care activities - Over the next 180 days, patient will: o Maintain a1c <7%  Health Maintenance  . Pharmacist Clinical Goal(s) o Over the next 180 days,  patient will work with PharmD and providers to complete health maintenance screenings/vaccinations . Interventions: o Consider completing Shingrix vaccine series at the pharmacy . Patient self care activities - Over the next 180 days, patient will: o Consider completing Shingrix vaccine series at the pharmacy  Medication management . Pharmacist Clinical Goal(s): o Over the next 180 days, patient will work with PharmD and providers to maintain optimal medication adherence . Current pharmacy: [facility] . Interventions o Comprehensive medication review performed. o Continue current medication management strategy . Patient self care activities - Over the next 180 days, patient will: o Focus on medication adherence by filling and taking medications appropriately  o Take medications as prescribed o Report any questions or concerns to PharmD and/or provider(s)  Please see past updates related to this goal by clicking on the "Past Updates" button in the selected goal         Care plan mailed  The pharmacy team will reach out to the patient again over the next 14 days.   De Blanch, PharmD Clinical Pharmacist [facility] at [facility] [phone]

## 2019-09-30 ENCOUNTER — Telehealth: Payer: Self-pay | Admitting: Family Medicine

## 2019-09-30 NOTE — Progress Notes (Signed)
  Chronic Care Management   Outreach Note  09/30/2019 Name: Jessica Dennis MRN: [id number] DOB: February 19, 1932  Referred by: Ann Held, DO Reason for referral : No chief complaint on file.   An unsuccessful telephone outreach was attempted today. The patient was referred to the pharmacist for assistance with care management and care coordination.   Follow Up Plan:   Carley Perdue [employer] Scheduler

## 2019-10-07 ENCOUNTER — Telehealth: Payer: Self-pay | Admitting: Pharmacist

## 2019-10-07 NOTE — Progress Notes (Addendum)
Chronic Care Management Pharmacy Assistant   Name: Jessica Dennis  MRN: [id number] DOB: January 18, 1933  Reason for Encounter: General Adherence Call  Patient Questions:  1.  Have you seen any other providers since your last visit? No  2.  Any changes in your medicines or health? No    PCP : Ann Held, DO  Allergies:  No Known Allergies  Medications: Outpatient Encounter Medications as of 10/07/2019  Medication Sig Note   LORazepam (ATIVAN) 0.5 MG tablet TAKE ONE TABLET BY MOUTH DAILY AS NEEDED    Accu-Chek FastClix Lancets MISC CHECK FOR BLOOD SUGAR DAILY    ammonium lactate (AMLACTIN) 12 % cream Apply topically daily as needed.     Ascorbic Acid (VITAMIN C) 1000 MG tablet Take 1,000 mg by mouth daily.    CALCIUM CARBONATE-VITAMIN D PO Take by mouth. Calcium 1200mg   Vitamin D 5000 units  Pt also has a calcium 600mg  plus 800 units vitamin D supplement    furosemide (LASIX) 20 MG tablet Take 1 tablet (20 mg total) by mouth daily.    gabapentin (NEURONTIN) 100 MG capsule Take 1 capsule (100 mg total) by mouth 2 (two) times daily.    gentamicin ointment (GARAMYCIN) 0.1 % Apply 1 application topically as needed.     glucose blood test strip Accu-Chek Fastclix Lancet Drum    Insulin Aspart Prot & Aspart (NOVOLOG MIX 70/30 [place]) Inject into the skin. 35 units in the mornings and 25 at night    KLOR-CON M20 20 MEQ tablet Take 1 tablet by mouth daily.    latanoprost (XALATAN) 0.005 % ophthalmic solution Place 1 drop into both eyes at bedtime.     levothyroxine (SYNTHROID) 75 MCG tablet TAKE ONE TABLET BY MOUTH DAILY    meloxicam (MOBIC) 7.5 MG tablet Take 1 tablet by mouth daily.    Omega-3 Fatty Acids (FISH OIL) 1200 MG CPDR Take by mouth at bedtime.     pantoprazole (PROTONIX) 40 MG tablet Take 1 tablet (40 mg total) by mouth daily.    Prednicarbate 0.1 % CREA Apply topically 2 (two) times daily.     simvastatin (ZOCOR) 20 MG tablet TAKE ONE TABLET BY MOUTH  EVERY NIGHT AT BEDTIME    spironolactone (ALDACTONE) 50 MG tablet Take 2 tablets (100 mg total) by mouth daily.    SSD 1 % cream 1 application daily.  04/13/2019: Uses PRN   tiZANidine (ZANAFLEX) 4 MG tablet TAKE ONE TABLET BY MOUTH EVERY NIGHT AT BEDTIME AS NEEDED MUSCLE SPASMS 04/13/2019: Patient doesn't remember receiving    vitamin E 180 MG (400 UNITS) capsule Take by mouth daily.     No facility-administered encounter medications on file as of 10/07/2019.    Current Diagnosis: Patient Active Problem List   Diagnosis Date Noted   Hyperlipidemia associated with type 2 diabetes mellitus ([place]) 10/27/2018   Generalized anxiety disorder 10/27/2018   Open wound, lower leg, left, initial encounter 10/27/2018   Tremor of right hand 10/27/2018   GAVE (gastric antral vascular ectasia) 63/87/5643   Alcoholic cirrhosis of liver with ascites ([place]) 08/02/2016   Portal hypertensive gastropathy ([place]) 08/02/2016   Iron deficiency anemia due to chronic blood loss 01/19/2015   Occult GI bleeding 01/19/2015   Angiodysplasia of ascending colon 10/25/2014   H/O bone density study 04/27/2013   Type 2 diabetes mellitus with hyperglycemia, with long-term current use of insulin ([place]) 03/24/2013   Vaginal dryness, menopausal 01/20/2013   Atrophic vaginitis 01/20/2013   S/P hysterectomy  01/20/2013   Cervical nerve root disorder 11/18/2012   Shoulder weakness 11/18/2012   Cancer of the skin, basal cell 09/03/2012   Squamous cell skin cancer 09/03/2012   Thrombocytopenia ([place]) 08/14/2012   Lymphadenopathy of right cervical region 04/01/2012   Hyperglycemia 09/19/2011   Heel pain 08/06/2011   Plantar fasciitis 08/06/2011   Arthritis of knee, degenerative 08/06/2011   Gonalgia 08/06/2011   History of anemia 06/28/2011   Anxiety 06/28/2011   Essential hypertension, benign 06/28/2011   GERD (gastroesophageal reflux disease) 06/28/2011   Hyperlipidemia LDL goal <100  06/28/2011   History of hysterectomy 06/28/2011   Menopause 06/28/2011   Hypothyroidism 06/28/2011   Glaucoma 06/28/2011   DJD (degenerative joint disease) 06/28/2011   Essential hypertension 04/27/2010   Acid reflux 10/28/2009   Absolute anemia 05/24/2009   Anemia, iron deficiency 05/24/2009   Adult hypothyroidism 11/22/2008   Diverticular disease of large intestine 05/15/2007   Elevated fasting blood sugar 05/15/2007   Psoriasis 05/15/2007   Menopausal symptom 05/15/2007    Goals Addressed   None    Contacted the patient for a general adherence call and reschedule her appointment with the clinical pharmacist. Unable to make contact. [facility]  Update: Patient returned my call. Stated she was doing well overall and did not have any concerns with her health at this time. She does not have any issues obtaining her medication.  Follow-Up:  Scheduled Follow-Up With Clinical Pharmacist for Friday October 23, 2019 at 12:30 pm.  Fanny Skates, [place] Pharmacist Assistant [phone]  Reviewed by: De Blanch, PharmD Clinical Pharmacist [facility] [phone]

## 2019-10-12 ENCOUNTER — Telehealth: Payer: HMO

## 2019-10-13 ENCOUNTER — Telehealth: Payer: HMO

## 2019-10-16 ENCOUNTER — Inpatient Hospital Stay (HOSPITAL_COMMUNITY)
Admission: EM | Admit: 2019-10-16 | Discharge: 2019-10-20 | DRG: 683 | Disposition: A | Discharge status: Skilled Nursing Facility | Payer: HMO | Attending: Family Medicine | Admitting: Family Medicine

## 2019-10-16 ENCOUNTER — Emergency Department (HOSPITAL_COMMUNITY): Payer: HMO

## 2019-10-16 ENCOUNTER — Other Ambulatory Visit: Payer: Self-pay

## 2019-10-16 ENCOUNTER — Encounter (HOSPITAL_COMMUNITY): Payer: Self-pay | Admitting: Emergency Medicine

## 2019-10-16 DIAGNOSIS — E875 Hyperkalemia: Secondary | ICD-10-CM

## 2019-10-16 DIAGNOSIS — Z833 Family history of diabetes mellitus: Secondary | ICD-10-CM

## 2019-10-16 DIAGNOSIS — I4891 Unspecified atrial fibrillation: Secondary | ICD-10-CM | POA: Diagnosis present

## 2019-10-16 DIAGNOSIS — E1121 Type 2 diabetes mellitus with diabetic nephropathy: Secondary | ICD-10-CM | POA: Diagnosis present

## 2019-10-16 DIAGNOSIS — Z90710 Acquired absence of both cervix and uterus: Secondary | ICD-10-CM

## 2019-10-16 DIAGNOSIS — Z85828 Personal history of other malignant neoplasm of skin: Secondary | ICD-10-CM

## 2019-10-16 DIAGNOSIS — E1169 Type 2 diabetes mellitus with other specified complication: Secondary | ICD-10-CM | POA: Diagnosis present

## 2019-10-16 DIAGNOSIS — T796XXA Traumatic ischemia of muscle, initial encounter: Secondary | ICD-10-CM | POA: Diagnosis not present

## 2019-10-16 DIAGNOSIS — Z79890 Hormone replacement therapy (postmenopausal): Secondary | ICD-10-CM

## 2019-10-16 DIAGNOSIS — G250 Essential tremor: Secondary | ICD-10-CM | POA: Diagnosis present

## 2019-10-16 DIAGNOSIS — K31819 Angiodysplasia of stomach and duodenum without bleeding: Secondary | ICD-10-CM | POA: Diagnosis present

## 2019-10-16 DIAGNOSIS — Z888 Allergy status to other drugs, medicaments and biological substances status: Secondary | ICD-10-CM

## 2019-10-16 DIAGNOSIS — R000 Tachycardia, unspecified: Secondary | ICD-10-CM | POA: Diagnosis present

## 2019-10-16 DIAGNOSIS — Z20822 Contact with and (suspected) exposure to covid-19: Secondary | ICD-10-CM | POA: Diagnosis present

## 2019-10-16 DIAGNOSIS — E785 Hyperlipidemia, unspecified: Secondary | ICD-10-CM | POA: Diagnosis present

## 2019-10-16 DIAGNOSIS — N179 Acute kidney failure, unspecified: Principal | ICD-10-CM

## 2019-10-16 DIAGNOSIS — Z794 Long term (current) use of insulin: Secondary | ICD-10-CM

## 2019-10-16 DIAGNOSIS — K7460 Unspecified cirrhosis of liver: Secondary | ICD-10-CM

## 2019-10-16 DIAGNOSIS — Z791 Long term (current) use of non-steroidal anti-inflammatories (NSAID): Secondary | ICD-10-CM

## 2019-10-16 DIAGNOSIS — S81011A Laceration without foreign body, right knee, initial encounter: Secondary | ICD-10-CM | POA: Diagnosis present

## 2019-10-16 DIAGNOSIS — Z79899 Other long term (current) drug therapy: Secondary | ICD-10-CM

## 2019-10-16 DIAGNOSIS — N390 Urinary tract infection, site not specified: Secondary | ICD-10-CM | POA: Diagnosis not present

## 2019-10-16 DIAGNOSIS — I10 Essential (primary) hypertension: Secondary | ICD-10-CM | POA: Diagnosis present

## 2019-10-16 DIAGNOSIS — E039 Hypothyroidism, unspecified: Secondary | ICD-10-CM | POA: Diagnosis present

## 2019-10-16 DIAGNOSIS — M6282 Rhabdomyolysis: Secondary | ICD-10-CM | POA: Diagnosis present

## 2019-10-16 DIAGNOSIS — R296 Repeated falls: Secondary | ICD-10-CM

## 2019-10-16 DIAGNOSIS — R252 Cramp and spasm: Secondary | ICD-10-CM

## 2019-10-16 DIAGNOSIS — E1165 Type 2 diabetes mellitus with hyperglycemia: Secondary | ICD-10-CM | POA: Diagnosis present

## 2019-10-16 DIAGNOSIS — Z8052 Family history of malignant neoplasm of bladder: Secondary | ICD-10-CM

## 2019-10-16 DIAGNOSIS — E860 Dehydration: Secondary | ICD-10-CM | POA: Diagnosis not present

## 2019-10-16 DIAGNOSIS — F411 Generalized anxiety disorder: Secondary | ICD-10-CM | POA: Diagnosis present

## 2019-10-16 DIAGNOSIS — R7989 Other specified abnormal findings of blood chemistry: Secondary | ICD-10-CM | POA: Diagnosis present

## 2019-10-16 DIAGNOSIS — D509 Iron deficiency anemia, unspecified: Secondary | ICD-10-CM | POA: Diagnosis present

## 2019-10-16 DIAGNOSIS — K219 Gastro-esophageal reflux disease without esophagitis: Secondary | ICD-10-CM | POA: Diagnosis present

## 2019-10-16 DIAGNOSIS — W010XXA Fall on same level from slipping, tripping and stumbling without subsequent striking against object, initial encounter: Secondary | ICD-10-CM | POA: Diagnosis present

## 2019-10-16 DIAGNOSIS — D696 Thrombocytopenia, unspecified: Secondary | ICD-10-CM

## 2019-10-16 DIAGNOSIS — K7031 Alcoholic cirrhosis of liver with ascites: Secondary | ICD-10-CM | POA: Diagnosis present

## 2019-10-16 DIAGNOSIS — Z8249 Family history of ischemic heart disease and other diseases of the circulatory system: Secondary | ICD-10-CM

## 2019-10-16 DIAGNOSIS — E861 Hypovolemia: Secondary | ICD-10-CM | POA: Diagnosis present

## 2019-10-16 DIAGNOSIS — Z96643 Presence of artificial hip joint, bilateral: Secondary | ICD-10-CM | POA: Diagnosis present

## 2019-10-16 LAB — URINALYSIS, ROUTINE W REFLEX MICROSCOPIC
Bilirubin Urine: NEGATIVE
Glucose, UA: NEGATIVE mg/dL
Hgb urine dipstick: NEGATIVE
Ketones, ur: 5 mg/dL — AB
Nitrite: POSITIVE — AB
Protein, ur: NEGATIVE mg/dL
Specific Gravity, Urine: 1.015 (ref 1.005–1.030)
pH: 5 (ref 5.0–8.0)

## 2019-10-16 LAB — COMPREHENSIVE METABOLIC PANEL
ALT: 34 U/L (ref 0–44)
AST: 87 U/L — ABNORMAL HIGH (ref 15–41)
Albumin: 3.5 g/dL (ref 3.5–5.0)
Alkaline Phosphatase: 96 U/L (ref 38–126)
Anion gap: 15 (ref 5–15)
BUN: 30 mg/dL — ABNORMAL HIGH (ref 8–23)
CO2: 20 mmol/L — ABNORMAL LOW (ref 22–32)
Calcium: 10.1 mg/dL (ref 8.9–10.3)
Chloride: 101 mmol/L (ref 98–111)
Creatinine, Ser: 1.35 mg/dL — ABNORMAL HIGH (ref 0.44–1.00)
GFR calc Af Amer: 42 mL/min — ABNORMAL LOW (ref >60)
GFR calc non Af Amer: 36 mL/min — ABNORMAL LOW (ref >60)
Glucose, Bld: 217 mg/dL — ABNORMAL HIGH (ref 70–99)
Potassium: 5.4 mmol/L — ABNORMAL HIGH (ref 3.5–5.1)
Sodium: 136 mmol/L (ref 135–145)
Total Bilirubin: 2.9 mg/dL — ABNORMAL HIGH (ref 0.3–1.2)
Total Protein: 6.5 g/dL (ref 6.5–8.1)

## 2019-10-16 LAB — PROTIME-INR
INR: 1.4 — ABNORMAL HIGH (ref 0.8–1.2)
Prothrombin Time: 16.9 seconds — ABNORMAL HIGH (ref 11.4–15.2)

## 2019-10-16 LAB — CBC
HCT: 31.8 % — ABNORMAL LOW (ref 36.0–46.0)
Hemoglobin: 9.9 g/dL — ABNORMAL LOW (ref 12.0–15.0)
MCH: 27.1 pg (ref 26.0–34.0)
MCHC: 31.1 g/dL (ref 30.0–36.0)
MCV: 87.1 fL (ref 80.0–100.0)
Platelets: 69 10*3/uL — ABNORMAL LOW (ref 150–400)
RBC: 3.65 MIL/uL — ABNORMAL LOW (ref 3.87–5.11)
RDW: 14 % (ref 11.5–15.5)
WBC: 12.5 10*3/uL — ABNORMAL HIGH (ref 4.0–10.5)
nRBC: 0 % (ref 0.0–0.2)

## 2019-10-16 LAB — AMMONIA: Ammonia: 46 umol/L — ABNORMAL HIGH (ref 9–35)

## 2019-10-16 LAB — CK: Total CK: 1431 U/L — ABNORMAL HIGH (ref 38–234)

## 2019-10-16 MED ORDER — SODIUM CHLORIDE 0.9 % IV SOLN: 1.0000 g | INTRAVENOUS | Status: DC

## 2019-10-16 MED ORDER — LIDOCAINE-EPINEPHRINE (PF) 2 %-1:200000 IJ SOLN: 20.0000 mL | Freq: Once | INTRAMUSCULAR | Status: DC

## 2019-10-16 MED ORDER — HYDROCODONE-ACETAMINOPHEN 5-325 MG PO TABS
1.0000 | ORAL_TABLET | ORAL | Status: DC | PRN
  Administered 2019-10-17 (×3): 2 via ORAL
  Administered 2019-10-19: 1 via ORAL
  Filled 2019-10-16 (×2): qty 2
  Filled 2019-10-16: qty 1
  Filled 2019-10-16: qty 2

## 2019-10-16 MED ORDER — SODIUM CHLORIDE 0.9 % IV SOLN
1.0000 g | Freq: Once | INTRAVENOUS | Status: AC
  Administered 2019-10-16: 1 g via INTRAVENOUS
  Filled 2019-10-16: qty 10

## 2019-10-16 MED ORDER — SODIUM CHLORIDE 0.9 % IV BOLUS
1000.0000 mL | Freq: Once | INTRAVENOUS | Status: AC
  Administered 2019-10-16: 1000 mL via INTRAVENOUS

## 2019-10-16 MED ORDER — THIAMINE HCL 100 MG PO TABS
100.0000 mg | ORAL_TABLET | Freq: Every day | ORAL | Status: DC
  Administered 2019-10-17 – 2019-10-20 (×4): 100 mg via ORAL
  Filled 2019-10-16 (×4): qty 1

## 2019-10-16 MED ORDER — ONDANSETRON HCL 4 MG PO TABS: 4.0000 mg | ORAL_TABLET | Freq: Four times a day (QID) | ORAL | Status: DC | PRN

## 2019-10-16 MED ORDER — CEFAZOLIN SODIUM-DEXTROSE 1-4 GM/50ML-% IV SOLN
1.0000 g | Freq: Once | INTRAVENOUS | Status: AC
  Administered 2019-10-16: 1 g via INTRAVENOUS
  Filled 2019-10-16: qty 50

## 2019-10-16 MED ORDER — ADULT MULTIVITAMIN W/MINERALS CH
1.0000 | ORAL_TABLET | Freq: Every day | ORAL | Status: DC
  Administered 2019-10-17 – 2019-10-20 (×4): 1 via ORAL
  Filled 2019-10-16 (×4): qty 1

## 2019-10-16 MED ORDER — ONDANSETRON HCL 4 MG/2ML IJ SOLN: 4.0000 mg | Freq: Four times a day (QID) | INTRAMUSCULAR | Status: DC | PRN

## 2019-10-16 MED ORDER — LIDOCAINE-EPINEPHRINE 2 %-1:100000 IJ SOLN
20.0000 mL | Freq: Once | INTRAMUSCULAR | Status: AC
  Administered 2019-10-16: 20 mL via INTRADERMAL
  Filled 2019-10-16: qty 1

## 2019-10-16 MED ORDER — FOLIC ACID 1 MG PO TABS
1.0000 mg | ORAL_TABLET | Freq: Every day | ORAL | Status: DC
  Administered 2019-10-17 – 2019-10-20 (×4): 1 mg via ORAL
  Filled 2019-10-16 (×4): qty 1

## 2019-10-16 MED ORDER — HEPARIN SODIUM (PORCINE) 5000 UNIT/ML IJ SOLN
5000.0000 [IU] | Freq: Three times a day (TID) | INTRAMUSCULAR | Status: DC
  Administered 2019-10-17 – 2019-10-20 (×10): 5000 [IU] via SUBCUTANEOUS
  Filled 2019-10-16 (×9): qty 1

## 2019-10-16 MED ORDER — TRAMADOL HCL 50 MG PO TABS
50.0000 mg | ORAL_TABLET | Freq: Three times a day (TID) | ORAL | Status: DC | PRN
  Administered 2019-10-18 – 2019-10-19 (×2): 50 mg via ORAL
  Filled 2019-10-16 (×2): qty 1

## 2019-10-16 MED ORDER — LACTATED RINGERS IV SOLN: INTRAVENOUS | Status: DC

## 2019-10-16 NOTE — ED Provider Notes (Addendum)
[facility] DEPT Provider Note   CSN: [id number] Arrival date & time: 10/16/19  1902     History Chief Complaint  Patient presents with  . Fall    Jessica Dennis is a 86 y.o. female.  Patient s/p fall last night. States unsure what caused her to fall, but thinks that she tripped. Indicates was unable to get up under own power, family had not been able to reach her today, so had neighbor check on her this evening, and found patient awake and alert, on floor. Pt with hx cirrhosis. No recent etoh use. Pt denies loc during fall. No headache. Denies neck or back pain. No chest pain or sob. No abd pain or vomiting/diiarrhea. Denies dysuria or gu c/o. w fall, contusion and laceration to right knee. Tetanus up to date within past couple years. EMS indicates CBG 253.  The history is provided by the patient, a relative and the EMS personnel.  Fall Pertinent negatives include no chest pain, no abdominal pain, no headaches and no shortness of breath.       Past Medical History:  Diagnosis Date  . Anemia   . Angiodysplasia of ascending colon 10/25/2014  . Anxiety   . Arthritis   . Borderline diabetes   . Cancer of the skin, basal cell 09/03/2012  . Cirrhosis ([place])   . Diabetes mellitus without complication ([place])   . Diverticular disease   . 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 GERD (gastroesophageal reflux disease)   . Hypertension   . Iron deficiency anemia due to chronic blood loss 01/19/2015  . Portal hypertensive gastropathy ([place]) 08/02/2016   ? Some GAVE also  . Thyroid disease     Patient Active Problem List   Diagnosis Date Noted  . Hyperlipidemia associated with type 2 diabetes mellitus ([place]) 10/27/2018  . Generalized anxiety disorder 10/27/2018  . Open wound, lower leg, left, initial encounter 10/27/2018  . Tremor of right hand 10/27/2018  . 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 Alcoholic cirrhosis of liver with  ascites ([place]) 08/02/2016  . Portal hypertensive gastropathy ([place]) 08/02/2016  . Iron deficiency anemia due to chronic blood loss 01/19/2015  . Occult GI bleeding 01/19/2015  . Angiodysplasia of ascending colon 10/25/2014  . H/O bone density study 04/27/2013  . Type 2 diabetes mellitus with hyperglycemia, with long-term current use of insulin ([place]) 03/24/2013  . Vaginal dryness, menopausal 01/20/2013  . Atrophic vaginitis 01/20/2013  . S/P hysterectomy 01/20/2013  . Cervical nerve root disorder 11/18/2012  . Shoulder weakness 11/18/2012  . Cancer of the skin, basal cell 09/03/2012  . Squamous cell skin cancer 09/03/2012  . Thrombocytopenia ([place]) 08/14/2012  . Lymphadenopathy of right cervical region 04/01/2012  . Hyperglycemia 09/19/2011  . Heel pain 08/06/2011  . Plantar fasciitis 08/06/2011  . Arthritis of knee, degenerative 08/06/2011  . Gonalgia 08/06/2011  . History of anemia 06/28/2011  . Anxiety 06/28/2011  . Essential hypertension, benign 06/28/2011  . GERD (gastroesophageal reflux disease) 06/28/2011  . Hyperlipidemia LDL goal <100 06/28/2011  . History of hysterectomy 06/28/2011  . Menopause 06/28/2011  . Hypothyroidism 06/28/2011  . Glaucoma 06/28/2011  . DJD (degenerative joint disease) 06/28/2011  . Essential hypertension 04/27/2010  . Acid reflux 10/28/2009  . Absolute anemia 05/24/2009  . Anemia, iron deficiency 05/24/2009  . Adult hypothyroidism 11/22/2008  . Diverticular disease of large intestine 05/15/2007  . Elevated fasting blood sugar 05/15/2007  . Psoriasis 05/15/2007  . Menopausal symptom 05/15/2007  Past Surgical History:  Procedure Laterality Date  . ABDOMINAL HYSTERECTOMY    . APPENDECTOMY    . COLONOSCOPY    . ESOPHAGOGASTRODUODENOSCOPY    . IR PARACENTESIS  05/25/2016  . LEG SKIN LESION  BIOPSY / EXCISION  12/11/14  . MOHS SURGERY     ankle  . TONSILLECTOMY    . TOTAL HIP ARTHROPLASTY Bilateral 1993, 2006  . UPPER GASTROINTESTINAL  ENDOSCOPY  09/09/2017     OB History    Gravida  2   Para  2   Term      Preterm      AB      Living  2     SAB      TAB      Ectopic      Multiple      Live Births              Family History  Problem Relation Age of Onset  . Bladder Cancer Father   . Heart attack Father   . Diabetes Mother   . Colon cancer Neg Hx   . Colon polyps Neg Hx   . Esophageal cancer Neg Hx   . Rectal cancer Neg Hx   . Stomach cancer Neg Hx     Social History   Tobacco Use  . Smoking status: Never Smoker  . Smokeless tobacco: Never Used  . Tobacco comment: NEVER USED TOBACCO  Vaping Use  . Vaping Use: Never used  Substance Use Topics  . Alcohol use: No    Alcohol/week: 2.0 standard drinks    Types: 2 Standard drinks or equivalent per week    Comment: rare occassion  . Drug use: No    Home Medications Prior to Admission medications   Medication Sig Start Date End Date Taking? Authorizing Provider  LORazepam (ATIVAN) 0.5 MG tablet TAKE ONE TABLET BY MOUTH DAILY AS NEEDED 09/14/19   Carollee Herter, Alferd Apa, DO  Accu-Chek FastClix Lancets MISC CHECK FOR BLOOD SUGAR DAILY 11/12/18   Roma Schanz R, DO  ammonium lactate (AMLACTIN) 12 % cream Apply topically daily as needed.  05/20/18   [provider]  Ascorbic Acid (VITAMIN C) 1000 MG tablet Take 1,000 mg by mouth daily.    [provider]  CALCIUM CARBONATE-VITAMIN D PO Take by mouth. Calcium 1200mg   Vitamin D 5000 units  Pt also has a calcium 600mg  plus 800 units vitamin D supplement    [provider]  furosemide (LASIX) 20 MG tablet Take 1 tablet (20 mg total) by mouth daily. 07/24/19   Gatha Mayer, MD  gabapentin (NEURONTIN) 100 MG capsule Take 1 capsule (100 mg total) by mouth 2 (two) times daily. 09/14/19   Ann Held, DO  gentamicin ointment (GARAMYCIN) 0.1 % Apply 1 application topically as needed.  06/04/19   [provider]  glucose blood test strip Accu-Chek  [employer], Historical, MD  Insulin Aspart Prot & Aspart (NOVOLOG MIX 70/30 Gerald) Inject into the skin. 35 units in the mornings and 25 at night    [provider]  KLOR-CON M20 20 MEQ tablet Take 1 tablet by mouth daily. 07/16/19   [provider]  latanoprost (XALATAN) 0.005 % ophthalmic solution Place 1 drop into both eyes at bedtime.  08/19/12   [provider]  levothyroxine (SYNTHROID) 75 MCG tablet TAKE ONE TABLET BY MOUTH DAILY 11/28/18   Roma Schanz R, DO  meloxicam (  MOBIC) 7.5 MG tablet Take 1 tablet by mouth daily. 07/16/19   [provider]  Omega-3 Fatty Acids (FISH OIL) 1200 MG CPDR Take by mouth at bedtime.     [provider]  pantoprazole (PROTONIX) 40 MG tablet Take 1 tablet (40 mg total) by mouth daily. 07/24/19   Gatha Mayer, MD  Prednicarbate 0.1 % CREA Apply topically 2 (two) times daily.     [provider]  simvastatin (ZOCOR) 20 MG tablet TAKE ONE TABLET BY MOUTH EVERY NIGHT AT BEDTIME 08/25/19   Carollee Herter, Alferd Apa, DO  spironolactone (ALDACTONE) 50 MG tablet Take 2 tablets (100 mg total) by mouth daily. 07/24/19   Gatha Mayer, MD  SSD 1 % cream 1 application daily.  02/06/18   [provider]  tiZANidine (ZANAFLEX) 4 MG tablet TAKE ONE TABLET BY MOUTH EVERY NIGHT AT BEDTIME AS NEEDED MUSCLE SPASMS 11/03/18   Carollee Herter, Alferd Apa, DO  vitamin E 180 MG (400 UNITS) capsule Take by mouth daily.     [provider]    Allergies    Patient has no known allergies.  Review of Systems   Review of Systems  Constitutional: Negative for chills and fever.  HENT: Negative for nosebleeds and sore throat.   Eyes: Negative for pain and visual disturbance.  Respiratory: Negative for cough and shortness of breath.   Cardiovascular: Negative for chest pain.  Gastrointestinal: Negative for abdominal pain, blood in stool, diarrhea and vomiting.  Endocrine: Negative for polyuria.    Genitourinary: Negative for dysuria and flank pain.  Musculoskeletal: Negative for back pain and neck pain.  Skin: Positive for wound.  Neurological: Negative for speech difficulty, numbness and headaches.  Hematological:       No anticoag use. +hx cirrhosis.   Psychiatric/Behavioral: Negative for confusion.    Physical Exam Updated Vital Signs BP (!) 154/74 (BP Location: Right Arm)   Pulse (!) 118   Temp 98.3 F (36.8 C) (Oral)   Resp (!) 25   SpO2 99%   Physical Exam Vitals and nursing note reviewed.  Constitutional:      Appearance: Normal appearance. She is well-developed.  HENT:     Head:     Comments: Contusion to upper lip. Teeth firmly intact. No malocclusion.     Nose: Nose normal.     Mouth/Throat:     Mouth: Mucous membranes are moist.  Eyes:     General: No scleral icterus.    Conjunctiva/sclera: Conjunctivae normal.     Pupils: Pupils are equal, round, and reactive to light.  Neck:     Vascular: No carotid bruit.     Trachea: No tracheal deviation.  Cardiovascular:     Rate and Rhythm: Regular rhythm. Tachycardia present.     Pulses: Normal pulses.     Heart sounds: Normal heart sounds. No murmur heard.  No friction rub. No gallop.   Pulmonary:     Effort: Pulmonary effort is normal. No respiratory distress.     Breath sounds: Normal breath sounds.  Chest:     Chest wall: No tenderness.  Abdominal:     General: Bowel sounds are normal. There is no distension.     Palpations: Abdomen is soft.     Tenderness: There is no abdominal tenderness. There is no guarding.     Comments: No abd bruising or contusion.   Genitourinary:    Comments: No cva tenderness.  Musculoskeletal:        General:  No swelling.     Cervical back: Normal range of motion and neck supple. No rigidity. No muscular tenderness.     Comments: CTLS spine, non tender, aligned, no step off. 4 cm laceration to right knee, tenderness to area. Otherwise good rom bil extremities without  pain or focal bony tenderness. Distal pulses palp bil.   Skin:    General: Skin is warm and dry.     Findings: No rash.  Neurological:     Mental Status: She is alert.     Comments: Alert, speech normal. GCS 15. Motor intact bil, stre 5/5.   Psychiatric:        Mood and Affect: Mood normal.     ED Results / Procedures / Treatments   Labs (all labs ordered are listed, but only abnormal results are displayed) Results for orders placed or performed during the hospital encounter of 10/16/19  CBC  Result Value Ref Range   WBC 12.5 (H) 4.0 - 10.5 K/uL   RBC 3.65 (L) 3.87 - 5.11 MIL/uL   Hemoglobin 9.9 (L) 12.0 - 15.0 g/dL   HCT 31.8 (L) 36 - 46 %   MCV 87.1 80.0 - 100.0 fL   MCH 27.1 26.0 - 34.0 pg   MCHC 31.1 30.0 - 36.0 g/dL   RDW 14.0 11.5 - 15.5 %   Platelets 69 (L) 150 - 400 K/uL   nRBC 0.0 0.0 - 0.2 %  Comprehensive metabolic panel  Result Value Ref Range   Sodium 136 135 - 145 mmol/L   Potassium 5.4 (H) 3.5 - 5.1 mmol/L   Chloride 101 98 - 111 mmol/L   CO2 20 (L) 22 - 32 mmol/L   Glucose, Bld 217 (H) 70 - 99 mg/dL   BUN 30 (H) 8 - 23 mg/dL   Creatinine, Ser 1.35 (H) 0.44 - 1.00 mg/dL   Calcium 10.1 8.9 - 10.3 mg/dL   Total Protein 6.5 6.5 - 8.1 g/dL   Albumin 3.5 3.5 - 5.0 g/dL   AST 87 (H) 15 - 41 U/L   ALT 34 0 - 44 U/L   Alkaline Phosphatase 96 38 - 126 U/L   Total Bilirubin 2.9 (H) 0.3 - 1.2 mg/dL   GFR calc non Af Amer 36 (L) >60 mL/min   GFR calc Af Amer 42 (L) >60 mL/min   Anion gap 15 5 - 15  Protime-INR  Result Value Ref Range   Prothrombin Time 16.9 (H) 11.4 - 15.2 seconds   INR 1.4 (H) 0.8 - 1.2  Ammonia  Result Value Ref Range   Ammonia 46 (H) 9 - 35 umol/L  CK  Result Value Ref Range   Total CK 1,431 (H) 38.0 - 234.0 U/L   CT HEAD WO CONTRAST  Result Date: 10/16/2019 CLINICAL DATA:  Fall EXAM: CT HEAD WITHOUT CONTRAST CT CERVICAL SPINE WITHOUT CONTRAST TECHNIQUE: Multidetector CT imaging of the head and cervical spine was performed following  the standard protocol without intravenous contrast. Multiplanar CT image reconstructions of the cervical spine were also generated. COMPARISON:  None. FINDINGS: CT HEAD FINDINGS Brain: No evidence of acute infarction, hemorrhage, hydrocephalus, extra-axial collection, visible mass lesion or mass effect. Symmetric prominence of the ventricles, cisterns and sulci compatible with parenchymal volume loss. Patchy areas of white matter hypoattenuation are most compatible with chronic microvascular angiopathy. Vascular: 8 mm rounded intermediate attenuation (42 HU) structure along the anterior interventricular septum in the vicinity of the anterior communicating artery/A1 segment on the right, concerning for potential aneurysm.  Calcifications of the carotid siphons and intradural vertebral arteries are noted as well. No concerning hyperdense vessel. Skull: No calvarial fracture or suspicious osseous lesion. No scalp swelling or hematoma. Small mineralization along the superolateral aspect of the left orbit, could reflect a benign dermal calcification. Sinuses/Orbits: Paranasal sinuses and mastoid air cells are predominantly clear. Likely remote deformity of the nasal bones in the absence of overlying swelling. Could correlate for point tenderness. Included orbital structures are unremarkable. Other: None CT CERVICAL SPINE FINDINGS Alignment: Stabilization collar is absent at the time of examination. There is straightening and slight reversal the normal cervical lordosis which is centered at the C5 level. 3 mm anterolisthesis C3 on C4. Stepwise 1-2 mm retrolisthesis C5-C7. 4 mm anterolisthesis C7 on T1. Spondylolisthesis is favored to be on a degenerative basis given associated endplate changes and facet degenerative features at these levels. No evidence of traumatic listhesis. No abnormally widened, perched or jumped facets. Normal alignment of the craniocervical and atlantoaxial articulations accounting for rightward  cranial rotation. Skull base and vertebrae: No acute skull base fracture. No vertebral body fracture or height loss. Normal bone mineralization. No worrisome osseous lesions. Multilevel Schmorl's node formations are present. Moderate arthrosis at the atlantodental and basion dens intervals. Spondylitic changes of the spine detailed further above and below. Soft tissues and spinal canal: No pre or paravertebral fluid or swelling. No visible canal hematoma. Disc levels: Multilevel intervertebral disc height loss with spondylitic endplate changes. Features most pronounced C5-6 where a slightly larger disc osteophyte complex results in some mild to moderate canal stenosis. Additional mild narrowings noted C4-5, and C6-T1. Multilevel uncinate spurring and facet hypertrophic changes are present throughout the cervical spine as well resulting in some mild multilevel neural foraminal narrowing with more moderate narrowing bilaterally C5-6. Upper chest: No acute abnormality in the upper chest or imaged lung apices. Other: Cervical carotid atherosclerosis. No concerning thyroid nodules. IMPRESSION: 1. No acute intracranial abnormality. No scalp swelling or calvarial fracture. 2. Likely remote deformity of the nasal bones in the absence of overlying swelling. Could correlate for point tenderness. 3. No evidence of acute fracture or traumatic listhesis of the cervical spine. 4. Spondylitic changes of the cervical spine, as detailed above. Maximal changes resulting in mild-to-moderate canal stenosis and foraminal narrowing C5-6. 5. Cervical and intracranial atherosclerosis. 6. 8 mm rounded intermediate attenuation structure along the anterior interventricular septum in the vicinity of the anterior communicating artery/A1 segment on the right, concerning for potential aneurysm. Consider further assessment with outpatient MRI/MRA. Electronically Signed   By: Lovena Le M.D.   On: 10/16/2019 21:01   CT CERVICAL SPINE WO  CONTRAST  Result Date: 10/16/2019 CLINICAL DATA:  Fall EXAM: CT HEAD WITHOUT CONTRAST CT CERVICAL SPINE WITHOUT CONTRAST TECHNIQUE: Multidetector CT imaging of the head and cervical spine was performed following the standard protocol without intravenous contrast. Multiplanar CT image reconstructions of the cervical spine were also generated. COMPARISON:  None. FINDINGS: CT HEAD FINDINGS Brain: No evidence of acute infarction, hemorrhage, hydrocephalus, extra-axial collection, visible mass lesion or mass effect. Symmetric prominence of the ventricles, cisterns and sulci compatible with parenchymal volume loss. Patchy areas of white matter hypoattenuation are most compatible with chronic microvascular angiopathy. Vascular: 8 mm rounded intermediate attenuation (42 HU) structure along the anterior interventricular septum in the vicinity of the anterior communicating artery/A1 segment on the right, concerning for potential aneurysm. Calcifications of the carotid siphons and intradural vertebral arteries are noted as well. No concerning hyperdense vessel. Skull: No calvarial fracture  or suspicious osseous lesion. No scalp swelling or hematoma. Small mineralization along the superolateral aspect of the left orbit, could reflect a benign dermal calcification. Sinuses/Orbits: Paranasal sinuses and mastoid air cells are predominantly clear. Likely remote deformity of the nasal bones in the absence of overlying swelling. Could correlate for point tenderness. Included orbital structures are unremarkable. Other: None CT CERVICAL SPINE FINDINGS Alignment: Stabilization collar is absent at the time of examination. There is straightening and slight reversal the normal cervical lordosis which is centered at the C5 level. 3 mm anterolisthesis C3 on C4. Stepwise 1-2 mm retrolisthesis C5-C7. 4 mm anterolisthesis C7 on T1. Spondylolisthesis is favored to be on a degenerative basis given associated endplate changes and facet  degenerative features at these levels. No evidence of traumatic listhesis. No abnormally widened, perched or jumped facets. Normal alignment of the craniocervical and atlantoaxial articulations accounting for rightward cranial rotation. Skull base and vertebrae: No acute skull base fracture. No vertebral body fracture or height loss. Normal bone mineralization. No worrisome osseous lesions. Multilevel Schmorl's node formations are present. Moderate arthrosis at the atlantodental and basion dens intervals. Spondylitic changes of the spine detailed further above and below. Soft tissues and spinal canal: No pre or paravertebral fluid or swelling. No visible canal hematoma. Disc levels: Multilevel intervertebral disc height loss with spondylitic endplate changes. Features most pronounced C5-6 where a slightly larger disc osteophyte complex results in some mild to moderate canal stenosis. Additional mild narrowings noted C4-5, and C6-T1. Multilevel uncinate spurring and facet hypertrophic changes are present throughout the cervical spine as well resulting in some mild multilevel neural foraminal narrowing with more moderate narrowing bilaterally C5-6. Upper chest: No acute abnormality in the upper chest or imaged lung apices. Other: Cervical carotid atherosclerosis. No concerning thyroid nodules. IMPRESSION: 1. No acute intracranial abnormality. No scalp swelling or calvarial fracture. 2. Likely remote deformity of the nasal bones in the absence of overlying swelling. Could correlate for point tenderness. 3. No evidence of acute fracture or traumatic listhesis of the cervical spine. 4. Spondylitic changes of the cervical spine, as detailed above. Maximal changes resulting in mild-to-moderate canal stenosis and foraminal narrowing C5-6. 5. Cervical and intracranial atherosclerosis. 6. 8 mm rounded intermediate attenuation structure along the anterior interventricular septum in the vicinity of the anterior communicating  artery/A1 segment on the right, concerning for potential aneurysm. Consider further assessment with outpatient MRI/MRA. Electronically Signed   By: Lovena Le M.D.   On: 10/16/2019 21:01   DG Chest Port 1 View  Result Date: 10/16/2019 CLINICAL DATA:  Fall, pain. EXAM: PORTABLE CHEST 1 VIEW COMPARISON:  Chest radiograph 10/02/2011 FINDINGS: The cardiomediastinal contours are normal. Aortic atherosclerosis. Pulmonary vasculature is normal. No consolidation, pleural effusion, or pneumothorax. No acute osseous abnormalities are seen. Mild chronic change about the right shoulder. IMPRESSION: No acute chest findings. Electronically Signed   By: Keith Rake M.D.   On: 10/16/2019 20:39   DG Knee Complete 4 Views Right  Result Date: 10/16/2019 CLINICAL DATA:  Trip and fall this morning.  Right knee laceration. EXAM: RIGHT KNEE - COMPLETE 4+ VIEW COMPARISON:  None. FINDINGS: No evidence of fracture or dislocation. Trace joint effusion. Mild tricompartmental peripheral spurring. Chondrocalcinosis in the lateral tibiofemoral compartment. There is generalized soft tissue edema. Small amount of soft tissue air in the anterior knee soft tissues. IMPRESSION: 1. No fracture or subluxation of the right knee. 2. Generalized soft tissue edema with small amount of soft tissue air in the anterior knee soft tissues  consistent with laceration. 3. Chondrocalcinosis in the lateral tibiofemoral compartment. Mild osteoarthritis. Electronically Signed   By: Keith Rake M.D.   On: 10/16/2019 20:37    EKG EKG Interpretation  Date/Time:  Friday October 16 2019 19:23:33 EDT Ventricular Rate:  118 PR Interval:    QRS Duration: 82 QT Interval:  332 QTC Calculation: 466 R Axis:   4 Text Interpretation: Sinus tachycardia Low voltage, precordial leads Nonspecific ST abnormality Confirmed by Lajean Saver [id number]) on 10/16/2019 7:37:45 PM   Radiology CT HEAD WO CONTRAST  Result Date: 10/16/2019 CLINICAL DATA:  Fall  EXAM: CT HEAD WITHOUT CONTRAST CT CERVICAL SPINE WITHOUT CONTRAST TECHNIQUE: Multidetector CT imaging of the head and cervical spine was performed following the standard protocol without intravenous contrast. Multiplanar CT image reconstructions of the cervical spine were also generated. COMPARISON:  None. FINDINGS: CT HEAD FINDINGS Brain: No evidence of acute infarction, hemorrhage, hydrocephalus, extra-axial collection, visible mass lesion or mass effect. Symmetric prominence of the ventricles, cisterns and sulci compatible with parenchymal volume loss. Patchy areas of white matter hypoattenuation are most compatible with chronic microvascular angiopathy. Vascular: 8 mm rounded intermediate attenuation (42 HU) structure along the anterior interventricular septum in the vicinity of the anterior communicating artery/A1 segment on the right, concerning for potential aneurysm. Calcifications of the carotid siphons and intradural vertebral arteries are noted as well. No concerning hyperdense vessel. Skull: No calvarial fracture or suspicious osseous lesion. No scalp swelling or hematoma. Small mineralization along the superolateral aspect of the left orbit, could reflect a benign dermal calcification. Sinuses/Orbits: Paranasal sinuses and mastoid air cells are predominantly clear. Likely remote deformity of the nasal bones in the absence of overlying swelling. Could correlate for point tenderness. Included orbital structures are unremarkable. Other: None CT CERVICAL SPINE FINDINGS Alignment: Stabilization collar is absent at the time of examination. There is straightening and slight reversal the normal cervical lordosis which is centered at the C5 level. 3 mm anterolisthesis C3 on C4. Stepwise 1-2 mm retrolisthesis C5-C7. 4 mm anterolisthesis C7 on T1. Spondylolisthesis is favored to be on a degenerative basis given associated endplate changes and facet degenerative features at these levels. No evidence of traumatic  listhesis. No abnormally widened, perched or jumped facets. Normal alignment of the craniocervical and atlantoaxial articulations accounting for rightward cranial rotation. Skull base and vertebrae: No acute skull base fracture. No vertebral body fracture or height loss. Normal bone mineralization. No worrisome osseous lesions. Multilevel Schmorl's node formations are present. Moderate arthrosis at the atlantodental and basion dens intervals. Spondylitic changes of the spine detailed further above and below. Soft tissues and spinal canal: No pre or paravertebral fluid or swelling. No visible canal hematoma. Disc levels: Multilevel intervertebral disc height loss with spondylitic endplate changes. Features most pronounced C5-6 where a slightly larger disc osteophyte complex results in some mild to moderate canal stenosis. Additional mild narrowings noted C4-5, and C6-T1. Multilevel uncinate spurring and facet hypertrophic changes are present throughout the cervical spine as well resulting in some mild multilevel neural foraminal narrowing with more moderate narrowing bilaterally C5-6. Upper chest: No acute abnormality in the upper chest or imaged lung apices. Other: Cervical carotid atherosclerosis. No concerning thyroid nodules. IMPRESSION: 1. No acute intracranial abnormality. No scalp swelling or calvarial fracture. 2. Likely remote deformity of the nasal bones in the absence of overlying swelling. Could correlate for point tenderness. 3. No evidence of acute fracture or traumatic listhesis of the cervical spine. 4. Spondylitic changes of the cervical spine, as detailed above.  Maximal changes resulting in mild-to-moderate canal stenosis and foraminal narrowing C5-6. 5. Cervical and intracranial atherosclerosis. 6. 8 mm rounded intermediate attenuation structure along the anterior interventricular septum in the vicinity of the anterior communicating artery/A1 segment on the right, concerning for potential aneurysm.  Consider further assessment with outpatient MRI/MRA. Electronically Signed   By: Lovena Le M.D.   On: 10/16/2019 21:01   CT CERVICAL SPINE WO CONTRAST  Result Date: 10/16/2019 CLINICAL DATA:  Fall EXAM: CT HEAD WITHOUT CONTRAST CT CERVICAL SPINE WITHOUT CONTRAST TECHNIQUE: Multidetector CT imaging of the head and cervical spine was performed following the standard protocol without intravenous contrast. Multiplanar CT image reconstructions of the cervical spine were also generated. COMPARISON:  None. FINDINGS: CT HEAD FINDINGS Brain: No evidence of acute infarction, hemorrhage, hydrocephalus, extra-axial collection, visible mass lesion or mass effect. Symmetric prominence of the ventricles, cisterns and sulci compatible with parenchymal volume loss. Patchy areas of white matter hypoattenuation are most compatible with chronic microvascular angiopathy. Vascular: 8 mm rounded intermediate attenuation (42 HU) structure along the anterior interventricular septum in the vicinity of the anterior communicating artery/A1 segment on the right, concerning for potential aneurysm. Calcifications of the carotid siphons and intradural vertebral arteries are noted as well. No concerning hyperdense vessel. Skull: No calvarial fracture or suspicious osseous lesion. No scalp swelling or hematoma. Small mineralization along the superolateral aspect of the left orbit, could reflect a benign dermal calcification. Sinuses/Orbits: Paranasal sinuses and mastoid air cells are predominantly clear. Likely remote deformity of the nasal bones in the absence of overlying swelling. Could correlate for point tenderness. Included orbital structures are unremarkable. Other: None CT CERVICAL SPINE FINDINGS Alignment: Stabilization collar is absent at the time of examination. There is straightening and slight reversal the normal cervical lordosis which is centered at the C5 level. 3 mm anterolisthesis C3 on C4. Stepwise 1-2 mm retrolisthesis  C5-C7. 4 mm anterolisthesis C7 on T1. Spondylolisthesis is favored to be on a degenerative basis given associated endplate changes and facet degenerative features at these levels. No evidence of traumatic listhesis. No abnormally widened, perched or jumped facets. Normal alignment of the craniocervical and atlantoaxial articulations accounting for rightward cranial rotation. Skull base and vertebrae: No acute skull base fracture. No vertebral body fracture or height loss. Normal bone mineralization. No worrisome osseous lesions. Multilevel Schmorl's node formations are present. Moderate arthrosis at the atlantodental and basion dens intervals. Spondylitic changes of the spine detailed further above and below. Soft tissues and spinal canal: No pre or paravertebral fluid or swelling. No visible canal hematoma. Disc levels: Multilevel intervertebral disc height loss with spondylitic endplate changes. Features most pronounced C5-6 where a slightly larger disc osteophyte complex results in some mild to moderate canal stenosis. Additional mild narrowings noted C4-5, and C6-T1. Multilevel uncinate spurring and facet hypertrophic changes are present throughout the cervical spine as well resulting in some mild multilevel neural foraminal narrowing with more moderate narrowing bilaterally C5-6. Upper chest: No acute abnormality in the upper chest or imaged lung apices. Other: Cervical carotid atherosclerosis. No concerning thyroid nodules. IMPRESSION: 1. No acute intracranial abnormality. No scalp swelling or calvarial fracture. 2. Likely remote deformity of the nasal bones in the absence of overlying swelling. Could correlate for point tenderness. 3. No evidence of acute fracture or traumatic listhesis of the cervical spine. 4. Spondylitic changes of the cervical spine, as detailed above. Maximal changes resulting in mild-to-moderate canal stenosis and foraminal narrowing C5-6. 5. Cervical and intracranial atherosclerosis.  6. 8 mm rounded  intermediate attenuation structure along the anterior interventricular septum in the vicinity of the anterior communicating artery/A1 segment on the right, concerning for potential aneurysm. Consider further assessment with outpatient MRI/MRA. Electronically Signed   By: Lovena Le M.D.   On: 10/16/2019 21:01   DG Chest Port 1 View  Result Date: 10/16/2019 CLINICAL DATA:  Fall, pain. EXAM: PORTABLE CHEST 1 VIEW COMPARISON:  Chest radiograph 10/02/2011 FINDINGS: The cardiomediastinal contours are normal. Aortic atherosclerosis. Pulmonary vasculature is normal. No consolidation, pleural effusion, or pneumothorax. No acute osseous abnormalities are seen. Mild chronic change about the right shoulder. IMPRESSION: No acute chest findings. Electronically Signed   By: Keith Rake M.D.   On: 10/16/2019 20:39   DG Knee Complete 4 Views Right  Result Date: 10/16/2019 CLINICAL DATA:  Trip and fall this morning.  Right knee laceration. EXAM: RIGHT KNEE - COMPLETE 4+ VIEW COMPARISON:  None. FINDINGS: No evidence of fracture or dislocation. Trace joint effusion. Mild tricompartmental peripheral spurring. Chondrocalcinosis in the lateral tibiofemoral compartment. There is generalized soft tissue edema. Small amount of soft tissue air in the anterior knee soft tissues. IMPRESSION: 1. No fracture or subluxation of the right knee. 2. Generalized soft tissue edema with small amount of soft tissue air in the anterior knee soft tissues consistent with laceration. 3. Chondrocalcinosis in the lateral tibiofemoral compartment. Mild osteoarthritis. Electronically Signed   By: Keith Rake M.D.   On: 10/16/2019 20:37    Procedures .Marland KitchenLaceration Repair  Date/Time: 10/16/2019 10:44 PM Performed by: Lajean Saver, MD Authorized by: Lajean Saver, MD   Consent:    Consent given by:  Patient Anesthesia (see MAR for exact dosages):    Anesthesia method:  Local infiltration   Local anesthetic:   Lidocaine 2% WITH epi Laceration details:    Location:  Leg   Leg location:  R knee   Length (cm):  5 Pre-procedure details:    Preparation:  Imaging obtained to evaluate for foreign bodies Treatment:    Area cleansed with:  Betadine   Amount of cleaning:  Extensive   Irrigation solution:  Sterile saline   Irrigation method:  Syringe Skin repair:    Repair method:  Sutures   Suture size:  3-0   Suture material:  Prolene   Suture technique:  Simple interrupted   Number of sutures:  6 Post-procedure details:    Dressing:  Sterile dressing   Patient tolerance of procedure:  Tolerated well, no immediate complications   (including critical care time)  Medications Ordered in ED Medications  sodium chloride 0.9 % bolus 1,000 mL (has no administration in time range)    ED Course  I have reviewed the triage vital signs and the nursing notes.  Pertinent labs & imaging results that were available during my care of the patient were reviewed by me and considered in my medical decision making (see chart for details).    MDM Rules/Calculators/A&P                          Iv ns. Continuous pulse ox and monitor. Stat labs and imaging ordered.   Reviewed nursing notes and prior charts for additional history.   Iv ns bolus.  Initial labs reviewed/interprted by me - mild aki, and elevated K. Iv ns bolus.   CXR reviewed/interpreted by me - no pna.   CT reviewed/interpreted by me - no hem.   Knee Xrays reviewed/interpreted by me - no fx.   Additional labs  reviewed/interpreted by me - inr mildly elevated, 1.4, c/w hx cirrhosis. Total CK elevated, c/w prolonged period on floor, mild rhabdo. Iv ns boluses.  Given general weakness, frequent falls, aki, dehydration, increased k, mild rhabdo - will admit for ivf, observation.   Hospitalists consulted for admission.  Discussed pt, will admit.   UA returns, positive for uti. Urine culture sent. Iv antibiotics.   Pt likely could benefit  from PT consult, and TOC consult during her hospital stay.      Final Clinical Impression(s) / ED Diagnoses Final diagnoses:  None    Rx / DC Orders ED Discharge Orders    None        Lajean Saver, MD 10/16/19 2258

## 2019-10-16 NOTE — H&P (Signed)
Jessica Dennis is an 86 y.o. female.   Chief Complaint: Falls, generalized weakness, unable to get up. HPI: The patient is a 86 yr old woman who lives at home on her own. She fell twice yesterday. Once earlier in the day and once at night. When she fell last night she was unable to get herself up off of the floor. Her family had a neighbor go over to check on her. She was found on the floor. She had a laceration on her right knee that has been sutured by EDP.  The patient carries a past medical history significant for Anxiety, DM II, Cirrhosis of the liver, history of alcohol abuse. GAVE, GERD, Hypertension, iron deficiency anemia, Portal gastropathy, Hypothyroidism.  In the ED she is found to be tachycardic with heart rates ranging between 114 and 123, She is also hypertensive with systolic pressures between 947-096 and diastolic pressures between 73 and 107. She is saturating 98% on room air. Laboratory demonstrates potassium of 5.4, Creatinine of 1.35. Baseline creatinine appears to be 1.02. Anion gap is 15. CK is 1431. WBC is 12.5, Hemoglobin is 9.9. INR is 1.4. Urinalysis is positive for UTI. Urine culture is pending. Blood cultures have been obtained.  The patient denies fevers, chills, nausea, vomiting, diarrhea. No lesions, rashes or neurological changes. No cough, shortness of breath, or chest pain.  Past Medical History:  Diagnosis Date  . Anemia   . Angiodysplasia of ascending colon 10/25/2014  . Anxiety   . Arthritis   . Borderline diabetes   . Cancer of the skin, basal cell 09/03/2012  . Cirrhosis ([place])   . Diabetes mellitus without complication ([place])   . Diverticular disease   . 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 GERD (gastroesophageal reflux disease)   . Hypertension   . Iron deficiency anemia due to chronic blood loss 01/19/2015  . Portal hypertensive gastropathy ([place]) 08/02/2016   ? Some GAVE also  . Thyroid disease     Past Surgical History:  Procedure  Laterality Date  . ABDOMINAL HYSTERECTOMY    . APPENDECTOMY    . COLONOSCOPY    . ESOPHAGOGASTRODUODENOSCOPY    . IR PARACENTESIS  05/25/2016  . LEG SKIN LESION  BIOPSY / EXCISION  12/11/14  . MOHS SURGERY     ankle  . TONSILLECTOMY    . TOTAL HIP ARTHROPLASTY Bilateral 1993, 2006  . UPPER GASTROINTESTINAL ENDOSCOPY  09/09/2017    Family History  Problem Relation Age of Onset  . Bladder Cancer Father   . Heart attack Father   . Diabetes Mother   . Colon cancer Neg Hx   . Colon polyps Neg Hx   . Esophageal cancer Neg Hx   . Rectal cancer Neg Hx   . Stomach cancer Neg Hx    Social History:  reports that she has never smoked. She has never used smokeless tobacco. She reports that she does not drink alcohol and does not use drugs. (Not in a hospital admission)   Allergies:  Allergies  Allergen Reactions  . Tizanidine     Hallucinate, confused     Pertinent items noted in HPI and remainder of comprehensive ROS otherwise negative.   General appearance: alert, cooperative and no distress Head: Normocephalic, without obvious abnormality, atraumatic Eyes: conjunctivae/corneas clear. PERRL, EOM's intact. Fundi benign. Throat: lips, mucosa, and tongue normal; teeth and gums normal Neck: no adenopathy, no carotid bruit, no JVD, supple, symmetrical, trachea midline and thyroid not enlarged, symmetric, no tenderness/mass/nodules Resp:  No increased work of breathing. No wheezes, rales, or rhonchi. No tactile fremitus. Chest wall: no tenderness Cardio: regular rate and rhythm, S1, S2 normal, no murmur, click, rub or gallop GI: soft, non-tender; bowel sounds normal; no masses,  no organomegaly Extremities: Right knee is bandaged. No cyanosis, clubbing, or edema bilaterally. Pulses: 2+ and symmetric Skin: Skin color, texture, turgor normal. No rashes or lesions Lymph nodes: Cervical, supraclavicular, and axillary nodes normal. Neurologic: Alert and oriented X 3, normal strength and  tone. Normal symmetric reflexes. Normal coordination and gait  Results for orders placed or performed during the hospital encounter of 10/16/19 (from the past 48 hour(s))  CBC     Status: Abnormal   Collection Time: 10/16/19  7:58 PM  Result Value Ref Range   WBC 12.5 (H) 4.0 - 10.5 K/uL   RBC 3.65 (L) 3.87 - 5.11 MIL/uL   Hemoglobin 9.9 (L) 12.0 - 15.0 g/dL   HCT 31.8 (L) 36 - 46 %   MCV 87.1 80.0 - 100.0 fL   MCH 27.1 26.0 - 34.0 pg   MCHC 31.1 30.0 - 36.0 g/dL   RDW 14.0 11.5 - 15.5 %   Platelets 69 (L) 150 - 400 K/uL    Comment: REPEATED TO VERIFY PLATELET COUNT CONFIRMED BY SMEAR SPECIMEN CHECKED FOR CLOTS P    nRBC 0.0 0.0 - 0.2 %    Comment: Performed at [facility], [place] [address] [place], [place] [address]  Comprehensive metabolic panel     Status: Abnormal   Collection Time: 10/16/19  7:58 PM  Result Value Ref Range   Sodium 136 135 - 145 mmol/L   Potassium 5.4 (H) 3.5 - 5.1 mmol/L   Chloride 101 98 - 111 mmol/L   CO2 20 (L) 22 - 32 mmol/L   Glucose, Bld 217 (H) 70 - 99 mg/dL    Comment: Glucose reference range applies only to samples taken after fasting for at least 8 hours.   BUN 30 (H) 8 - 23 mg/dL   Creatinine, Ser 1.35 (H) 0.44 - 1.00 mg/dL   Calcium 10.1 8.9 - 10.3 mg/dL   Total Protein 6.5 6.5 - 8.1 g/dL   Albumin 3.5 3.5 - 5.0 g/dL   AST 87 (H) 15 - 41 U/L   ALT 34 0 - 44 U/L   Alkaline Phosphatase 96 38 - 126 U/L   Total Bilirubin 2.9 (H) 0.3 - 1.2 mg/dL   GFR calc non Af Amer 36 (L) >60 mL/min   GFR calc Af Amer 42 (L) >60 mL/min   Anion gap 15 5 - 15    Comment: Performed at [facility], [place] [address] [place], [place] [address]  Protime-INR     Status: Abnormal   Collection Time: 10/16/19  7:58 PM  Result Value Ref Range   Prothrombin Time 16.9 (H) 11.4 - 15.2 seconds   INR 1.4 (H) 0.8 - 1.2    Comment: (NOTE) INR goal varies based on device and disease states. Performed at [facility], [place] [address], [place],  [address]   Ammonia     Status: Abnormal   Collection Time: 10/16/19  7:58 PM  Result Value Ref Range   Ammonia 46 (H) 9 - 35 umol/L    Comment: SLIGHT HEMOLYSIS Performed at [facility], [place] [address], [place],  [address]   CK     Status: Abnormal   Collection Time: 10/16/19  7:58 PM  Result  Value Ref Range   Total CK 1,431 (H) 38.0 - 234.0 U/L    Comment: Performed at [facility], [place] [address] [place], [place] [address]  Urinalysis, Routine w reflex microscopic     Status: Abnormal   Collection Time: 10/16/19 10:15 PM  Result Value Ref Range   Color, Urine YELLOW YELLOW   APPearance CLEAR CLEAR   Specific Gravity, Urine 1.015 1.005 - 1.030   pH 5.0 5.0 - 8.0   Glucose, UA NEGATIVE NEGATIVE mg/dL   Hgb urine dipstick NEGATIVE NEGATIVE   Bilirubin Urine NEGATIVE NEGATIVE   Ketones, ur 5 (A) NEGATIVE mg/dL   Protein, ur NEGATIVE NEGATIVE mg/dL   Nitrite POSITIVE (A) NEGATIVE   Leukocytes,Ua MODERATE (A) NEGATIVE   RBC / HPF 0-5 0 - 5 RBC/hpf   WBC, UA 21-50 0 - 5 WBC/hpf   Bacteria, UA MANY (A) NONE SEEN   Squamous Epithelial / LPF 0-5 0 - 5   Mucus PRESENT    Hyaline Casts, UA PRESENT     Comment: Performed at [facility], [place] [address] [place], [place] [address]   [email or web address] @  Blood pressure (!) 166/83, pulse (!) 124, temperature 98.3 F (36.8 C), temperature source Oral, resp. rate 18, SpO2 99 %.   Assessment and Plan: Problem  Hypovolemia  Acute Lower Uti  Rhabdomyolysis  Aki (Acute Kidney Injury) ([facility])  Hyperlipidemia Associated With Type 2 Diabetes Mellitus ([facility])  Generalized Anxiety Disorder  Gave (Gastric Antral Vascular Ectasia)  Alcoholic Cirrhosis of Liver With Ascites ([facility])  Thrombocytopenia ([facility])  Anxiety  Gerd (Gastroesophageal Reflux Disease)  Hyperlipidemia Ldl Goal <100  Essential Hypertension  Adult Hypothyroidism    Rhabdomyolysis:  Continue IV Fluids. Monitor. Due to spending 24 hours on the floor.  Volume depletion: IV fluids.  AKI: Due to volume depletion and rhabdomyolysis. IV fluids. Monitor creatinine, electrolytes, and volume status. Avoid nephrotoxins and hypotension.  UTI: The patient has been started on IV Rocephin. Urine cultures ordered and blood cultures x 2 ordered.  Alcoholic cirrhosis of the liver with thrombocytopenia and GAVE: Pt is no longer drinking.   GERD: Continue PPI as at home.  Generalized Anxiety Disorder: As needed ativan will be made available.  Hypertension: The patient takes quinapril, but this will be held due to the patient's elevated creatinine. I will start norvasc and hydralazine.  Hypothyroidism: Continue synthroid as at home.  I have seen and examined this patient myself. I have spent 72 minutes in her evaluation and care.  DVT Prophylaxis: Heparin CODE STATUS: Full Code Family Communication: Daughter is at bedside Disposition: The patient is being admitted to a telemetry bed as an observation patient. Status is: Observation  The patient remains OBS appropriate and will d/c before 2 midnights.  Dispo: The patient is from: Home              Anticipated d/c is to: Home              Anticipated d/c date is: 1 day              Patient currently is not medically stable to d/c.  Severity of Illness: The appropriate patient status for this patient is OBSERVATION. Observation status is judged to be reasonable and necessary in order to provide the required intensity of service to ensure the patient's safety. The patient's presenting symptoms, physical exam findings, and initial radiographic and laboratory data in the context of their medical condition is felt to place them  at decreased risk for further clinical deterioration. Furthermore, it is anticipated that the patient will be medically stable for discharge from the hospital within 2 midnights of admission.  The following factors support the patient status of observation.   " The patient's presenting symptoms include weakness, laceration to knee. " The physical exam findings include Dry mucous memebranes, tachycardia. " The initial radiographic and laboratory data are demonstrate UTI, AKI, and Leukocytosis, Elevated CK.Marland Kitchen  Jessica Dennis 10/16/2019, 11:49 PM

## 2019-10-16 NOTE — ED Triage Notes (Signed)
Pt BIB EMS from home. Reports pt tripped and fell around 9 am this morning and remained on the floor for ~10 hours. Pt has 2-3 in lac on right knee and swollen right ankle. Denies LOC.  A&O x 4 and normally independent at home.   CBG 253 20G LAC 200 cc NS given by EMS

## 2019-10-17 ENCOUNTER — Encounter (HOSPITAL_COMMUNITY): Payer: Self-pay | Admitting: Internal Medicine

## 2019-10-17 DIAGNOSIS — R000 Tachycardia, unspecified: Secondary | ICD-10-CM | POA: Diagnosis present

## 2019-10-17 DIAGNOSIS — S81011A Laceration without foreign body, right knee, initial encounter: Secondary | ICD-10-CM | POA: Diagnosis present

## 2019-10-17 DIAGNOSIS — F411 Generalized anxiety disorder: Secondary | ICD-10-CM | POA: Diagnosis present

## 2019-10-17 DIAGNOSIS — Z888 Allergy status to other drugs, medicaments and biological substances status: Secondary | ICD-10-CM | POA: Diagnosis not present

## 2019-10-17 DIAGNOSIS — D696 Thrombocytopenia, unspecified: Secondary | ICD-10-CM | POA: Diagnosis present

## 2019-10-17 DIAGNOSIS — N390 Urinary tract infection, site not specified: Secondary | ICD-10-CM | POA: Diagnosis present

## 2019-10-17 DIAGNOSIS — M6282 Rhabdomyolysis: Secondary | ICD-10-CM | POA: Diagnosis present

## 2019-10-17 DIAGNOSIS — Z8249 Family history of ischemic heart disease and other diseases of the circulatory system: Secondary | ICD-10-CM | POA: Diagnosis not present

## 2019-10-17 DIAGNOSIS — Z794 Long term (current) use of insulin: Secondary | ICD-10-CM | POA: Diagnosis not present

## 2019-10-17 DIAGNOSIS — E1121 Type 2 diabetes mellitus with diabetic nephropathy: Secondary | ICD-10-CM | POA: Diagnosis present

## 2019-10-17 DIAGNOSIS — E861 Hypovolemia: Secondary | ICD-10-CM | POA: Diagnosis present

## 2019-10-17 DIAGNOSIS — I4891 Unspecified atrial fibrillation: Secondary | ICD-10-CM | POA: Diagnosis present

## 2019-10-17 DIAGNOSIS — K7031 Alcoholic cirrhosis of liver with ascites: Secondary | ICD-10-CM | POA: Diagnosis present

## 2019-10-17 DIAGNOSIS — Z96643 Presence of artificial hip joint, bilateral: Secondary | ICD-10-CM | POA: Diagnosis present

## 2019-10-17 DIAGNOSIS — Z833 Family history of diabetes mellitus: Secondary | ICD-10-CM | POA: Diagnosis not present

## 2019-10-17 DIAGNOSIS — G250 Essential tremor: Secondary | ICD-10-CM | POA: Diagnosis present

## 2019-10-17 DIAGNOSIS — E039 Hypothyroidism, unspecified: Secondary | ICD-10-CM | POA: Diagnosis present

## 2019-10-17 DIAGNOSIS — Z90710 Acquired absence of both cervix and uterus: Secondary | ICD-10-CM | POA: Diagnosis not present

## 2019-10-17 DIAGNOSIS — E860 Dehydration: Secondary | ICD-10-CM | POA: Diagnosis present

## 2019-10-17 DIAGNOSIS — I10 Essential (primary) hypertension: Secondary | ICD-10-CM | POA: Diagnosis present

## 2019-10-17 DIAGNOSIS — N179 Acute kidney failure, unspecified: Secondary | ICD-10-CM | POA: Diagnosis present

## 2019-10-17 DIAGNOSIS — Z20822 Contact with and (suspected) exposure to covid-19: Secondary | ICD-10-CM | POA: Diagnosis present

## 2019-10-17 DIAGNOSIS — E785 Hyperlipidemia, unspecified: Secondary | ICD-10-CM | POA: Diagnosis present

## 2019-10-17 DIAGNOSIS — Z85828 Personal history of other malignant neoplasm of skin: Secondary | ICD-10-CM | POA: Diagnosis not present

## 2019-10-17 DIAGNOSIS — W010XXA Fall on same level from slipping, tripping and stumbling without subsequent striking against object, initial encounter: Secondary | ICD-10-CM | POA: Diagnosis present

## 2019-10-17 DIAGNOSIS — R296 Repeated falls: Secondary | ICD-10-CM | POA: Diagnosis not present

## 2019-10-17 DIAGNOSIS — Z8052 Family history of malignant neoplasm of bladder: Secondary | ICD-10-CM | POA: Diagnosis not present

## 2019-10-17 DIAGNOSIS — K219 Gastro-esophageal reflux disease without esophagitis: Secondary | ICD-10-CM | POA: Diagnosis present

## 2019-10-17 LAB — GLUCOSE, CAPILLARY
Glucose-Capillary: 244 mg/dL — ABNORMAL HIGH (ref 70–99)
Glucose-Capillary: 231 mg/dL — ABNORMAL HIGH (ref 70–99)
Glucose-Capillary: 193 mg/dL — ABNORMAL HIGH (ref 70–99)
Glucose-Capillary: 143 mg/dL — ABNORMAL HIGH (ref 70–99)

## 2019-10-17 LAB — CBC
HCT: 30.5 % — ABNORMAL LOW (ref 36.0–46.0)
Hemoglobin: 9.3 g/dL — ABNORMAL LOW (ref 12.0–15.0)
MCH: 27.4 pg (ref 26.0–34.0)
MCHC: 30.5 g/dL (ref 30.0–36.0)
MCV: 89.7 fL (ref 80.0–100.0)
Platelets: 44 10*3/uL — ABNORMAL LOW (ref 150–400)
RBC: 3.4 MIL/uL — ABNORMAL LOW (ref 3.87–5.11)
RDW: 14.4 % (ref 11.5–15.5)
WBC: 9.1 10*3/uL (ref 4.0–10.5)
nRBC: 0 % (ref 0.0–0.2)

## 2019-10-17 LAB — COMPREHENSIVE METABOLIC PANEL
ALT: 28 U/L (ref 0–44)
AST: 71 U/L — ABNORMAL HIGH (ref 15–41)
Albumin: 3.3 g/dL — ABNORMAL LOW (ref 3.5–5.0)
Alkaline Phosphatase: 88 U/L (ref 38–126)
Anion gap: 12 (ref 5–15)
BUN: 29 mg/dL — ABNORMAL HIGH (ref 8–23)
CO2: 20 mmol/L — ABNORMAL LOW (ref 22–32)
Calcium: 8.9 mg/dL (ref 8.9–10.3)
Chloride: 104 mmol/L (ref 98–111)
Creatinine, Ser: 1.15 mg/dL — ABNORMAL HIGH (ref 0.44–1.00)
GFR calc Af Amer: 51 mL/min — ABNORMAL LOW (ref >60)
GFR calc non Af Amer: 44 mL/min — ABNORMAL LOW (ref >60)
Glucose, Bld: 254 mg/dL — ABNORMAL HIGH (ref 70–99)
Potassium: 4.6 mmol/L (ref 3.5–5.1)
Sodium: 136 mmol/L (ref 135–145)
Total Bilirubin: 1.8 mg/dL — ABNORMAL HIGH (ref 0.3–1.2)
Total Protein: 5.8 g/dL — ABNORMAL LOW (ref 6.5–8.1)

## 2019-10-17 LAB — RESPIRATORY PANEL BY RT PCR (FLU A&B, COVID)
Influenza A by PCR: NEGATIVE
Influenza B by PCR: NEGATIVE
SARS Coronavirus 2 by RT PCR: NEGATIVE

## 2019-10-17 MED ORDER — ASCORBIC ACID 500 MG PO TABS
1000.0000 mg | ORAL_TABLET | Freq: Every day | ORAL | Status: DC
  Administered 2019-10-17 – 2019-10-20 (×4): 1000 mg via ORAL
  Filled 2019-10-17 (×4): qty 2

## 2019-10-17 MED ORDER — GABAPENTIN 100 MG PO CAPS
100.0000 mg | ORAL_CAPSULE | Freq: Two times a day (BID) | ORAL | Status: DC
  Administered 2019-10-17 – 2019-10-20 (×6): 100 mg via ORAL
  Filled 2019-10-17 (×8): qty 1

## 2019-10-17 MED ORDER — LATANOPROST 0.005 % OP SOLN
1.0000 [drp] | Freq: Every day | OPHTHALMIC | Status: DC
  Administered 2019-10-17 – 2019-10-19 (×3): 1 [drp] via OPHTHALMIC
  Filled 2019-10-17: qty 2.5

## 2019-10-17 MED ORDER — INSULIN ASPART PROT & ASPART (70-30 MIX) 100 UNIT/ML ~~LOC~~ SUSP
35.0000 [IU] | Freq: Every day | SUBCUTANEOUS | Status: DC
  Administered 2019-10-17 – 2019-10-20 (×4): 35 [IU] via SUBCUTANEOUS
  Filled 2019-10-17: qty 10

## 2019-10-17 MED ORDER — LEVOTHYROXINE SODIUM 75 MCG PO TABS
75.0000 ug | ORAL_TABLET | Freq: Every day | ORAL | Status: DC
  Administered 2019-10-17 – 2019-10-20 (×4): 75 ug via ORAL
  Filled 2019-10-17 (×3): qty 1

## 2019-10-17 MED ORDER — PANTOPRAZOLE SODIUM 40 MG PO TBEC
40.0000 mg | DELAYED_RELEASE_TABLET | Freq: Every day | ORAL | Status: DC
  Administered 2019-10-17 – 2019-10-20 (×4): 40 mg via ORAL
  Filled 2019-10-17 (×4): qty 1

## 2019-10-17 MED ORDER — INSULIN ASPART PROT & ASPART (70-30 MIX) 100 UNIT/ML ~~LOC~~ SUSP
25.0000 [IU] | Freq: Every day | SUBCUTANEOUS | Status: DC
  Administered 2019-10-17 – 2019-10-19 (×3): 25 [IU] via SUBCUTANEOUS
  Filled 2019-10-17: qty 10

## 2019-10-17 NOTE — Progress Notes (Signed)
BP lying 109/55 pulse 81, sitting 109/63 pulse 80, standing 124/65 P 112.  Patient denies any dizziness with sitting or standing.

## 2019-10-17 NOTE — Progress Notes (Signed)
PROGRESS NOTE    Jessica Dennis  MMN:[id number] DOB: 15-Feb-1932 DOA: 10/16/2019 PCP: Ann Held, DO  Brief Narrative:  86 year old white lady Known history chronic TCP + cirrhosis + splenomegaly followed by Dr. Ivor Reining history of esophageal varices followed by Dr. Arelia Longest Essential tremor under work-up-suspicious nevus of skin, HTN, HLD, hypothyroid Increasing frequency of falls recently-fell on 10/16/2019 and remained on the floor for 10 hours 3 lacerations and right knee swollen ankle Work-up revealed sinus tach hypertension 150s Potassium 5.4 creatinine 1.3 above baseline 1.02 gap 15 CK 1431 White count 12.5 UA?  Infection urine culture pending   Assessment & Plan:   Active Problems:   GERD (gastroesophageal reflux disease)   Hyperlipidemia LDL goal <100   Thrombocytopenia (HCC)   Essential hypertension   Adult hypothyroidism   Alcoholic cirrhosis of liver with ascites (HCC)   GAVE (gastric antral vascular ectasia)   Hyperlipidemia associated with type 2 diabetes mellitus (HCC)   Generalized anxiety disorder   AKI (acute kidney injury) ([place])   Hypovolemia   Acute lower UTI   Rhabdomyolysis   1. Accidental fall a. Precipitant did not seem to be anything cardiac but we will keep on monitors b. She tells me that she is dizzy at times so we will ask for orthostatic vital signs c. Prior to admission she was on Lasix 20 which has been held, quinapril 40 which has been held Aldactone 100 daily which has been held and this will need to be reevaluated in the outpatient setting in the setting of orthostatic blood pressures 2. Asymptomatic bacteriuria a. No dysuria no burning no pressure b. Urine was collected in a cup-discontinue antibiotics today 3. Alcoholic cirrhosis with gave and reflux a. Protonix 40 daily b. Outpatient follow-up with gastroenterology no concerns for bleeding at this time 4. Sinus tachycardia on admission a. Likely secondary to volume  depletion b. Seems to still have some sinus tach which I expect will get better 5. General anxiety disorder a. Continue lorazepam 0.5 as needed if orthostatics are negative 6. Mild hyperkalemia AKI and rhabdomyolysis on admission a. Continue LR 125 cc/h and saline lock if possible a.m. after orthostatics are obtained b. Check CK again for completion sake tomorrow 7. HTN a. Holding lisinopril etc. as above at this time check orthostatics 8. Hypothyroid a. Continue Synthroid 75 orally daily monitor as an outpatient 9. Diabetes mellitus with some underlying nephropathy a. Continue at this time 70/30 insulin 25 units p.m., 35 units a.m. b. Resume Metformin once kidney function is better c. Adjust blood sugar coverage going forward as needed not controlled to higher doses of 70/30 insulin d. Neuropathy continue gabapentin 100 twice daily 10. Prior squamous cell CA skin 11. Essential tremor  DVT prophylaxis: Lovenox Code Status: Full Family Communication: None present Disposition:   Status is: Observation  The patient remains OBS appropriate and will d/c before 2 midnights.  Dispo: The patient is from: Home              Anticipated d/c is to: Home              Anticipated d/c date is: 2 days              Patient currently is not medically stable to d/c.    Consultants:   None yet  Procedures: None  Antimicrobials: Discontinued ceftriaxone   Subjective: Patient is doing fair she has some pain but was medicated with tramadol She tells me she is dizzy occasionally with  standing and uses a cane to get around at times-she still drives and is quite functional She lives alone  Objective: Vitals:   10/17/19 0000 10/17/19 0056 10/17/19 0137 10/17/19 0545  BP: 140/64 125/90  127/73  Pulse: (!) 115 (!) 112  (!) 109  Resp: 18 20 18 19   Temp: 98.5 F (36.9 C) 98.7 F (37.1 C)  98.5 F (36.9 C)  TempSrc: Oral Oral  Oral  SpO2: 96% 99%  95%    Intake/Output Summary (Last 24  hours) at 10/17/2019 0739 Last data filed at 10/17/2019 0500 Gross per 24 hour  Intake 154.85 ml  Output 220 ml  Net -65.15 ml   There were no vitals filed for this visit.  Examination:  General exam: Awake coherent pleasant bruising to front of face Respiratory system: Clear no added sounds although decreased air entry Cardiovascular system: S1-S2 does have a holosystolic murmur in the right upper sternal edge Gastrointestinal system: Soft nontender no rebound no guarding without any masses Central nervous system: Neurologically intact no focal deficits smile symmetric moving all 4 limbs equally power intact Extremities: No lower extremity edema although does have some bruising Skin: Bruise to the philtrum of the lip also has multiple excoriations on lower extremities and bandages to the front of the right knee which were not examined Psychiatry: Euthymic and pleasant  Data Reviewed: I have personally reviewed following labs and imaging studies BUNs/creatinine 30/1.3-->29/1.1 Potassium 5.4--insulin 4.6 CO2 20 Anion gap 12 WBC down from 12.5-9.1 hemoglobin 9.3 platelet 69-->44  Radiology Studies: CT HEAD WO CONTRAST  Result Date: 10/16/2019 CLINICAL DATA:  Fall EXAM: CT HEAD WITHOUT CONTRAST CT CERVICAL SPINE WITHOUT CONTRAST TECHNIQUE: Multidetector CT imaging of the head and cervical spine was performed following the standard protocol without intravenous contrast. Multiplanar CT image reconstructions of the cervical spine were also generated. COMPARISON:  None. FINDINGS: CT HEAD FINDINGS Brain: No evidence of acute infarction, hemorrhage, hydrocephalus, extra-axial collection, visible mass lesion or mass effect. Symmetric prominence of the ventricles, cisterns and sulci compatible with parenchymal volume loss. Patchy areas of white matter hypoattenuation are most compatible with chronic microvascular angiopathy. Vascular: 8 mm rounded intermediate attenuation (42 HU) structure along the  anterior interventricular septum in the vicinity of the anterior communicating artery/A1 segment on the right, concerning for potential aneurysm. Calcifications of the carotid siphons and intradural vertebral arteries are noted as well. No concerning hyperdense vessel. Skull: No calvarial fracture or suspicious osseous lesion. No scalp swelling or hematoma. Small mineralization along the superolateral aspect of the left orbit, could reflect a benign dermal calcification. Sinuses/Orbits: Paranasal sinuses and mastoid air cells are predominantly clear. Likely remote deformity of the nasal bones in the absence of overlying swelling. Could correlate for point tenderness. Included orbital structures are unremarkable. Other: None CT CERVICAL SPINE FINDINGS Alignment: Stabilization collar is absent at the time of examination. There is straightening and slight reversal the normal cervical lordosis which is centered at the C5 level. 3 mm anterolisthesis C3 on C4. Stepwise 1-2 mm retrolisthesis C5-C7. 4 mm anterolisthesis C7 on T1. Spondylolisthesis is favored to be on a degenerative basis given associated endplate changes and facet degenerative features at these levels. No evidence of traumatic listhesis. No abnormally widened, perched or jumped facets. Normal alignment of the craniocervical and atlantoaxial articulations accounting for rightward cranial rotation. Skull base and vertebrae: No acute skull base fracture. No vertebral body fracture or height loss. Normal bone mineralization. No worrisome osseous lesions. Multilevel Schmorl's node formations are present. Moderate  arthrosis at the atlantodental and basion dens intervals. Spondylitic changes of the spine detailed further above and below. Soft tissues and spinal canal: No pre or paravertebral fluid or swelling. No visible canal hematoma. Disc levels: Multilevel intervertebral disc height loss with spondylitic endplate changes. Features most pronounced C5-6 where a  slightly larger disc osteophyte complex results in some mild to moderate canal stenosis. Additional mild narrowings noted C4-5, and C6-T1. Multilevel uncinate spurring and facet hypertrophic changes are present throughout the cervical spine as well resulting in some mild multilevel neural foraminal narrowing with more moderate narrowing bilaterally C5-6. Upper chest: No acute abnormality in the upper chest or imaged lung apices. Other: Cervical carotid atherosclerosis. No concerning thyroid nodules. IMPRESSION: 1. No acute intracranial abnormality. No scalp swelling or calvarial fracture. 2. Likely remote deformity of the nasal bones in the absence of overlying swelling. Could correlate for point tenderness. 3. No evidence of acute fracture or traumatic listhesis of the cervical spine. 4. Spondylitic changes of the cervical spine, as detailed above. Maximal changes resulting in mild-to-moderate canal stenosis and foraminal narrowing C5-6. 5. Cervical and intracranial atherosclerosis. 6. 8 mm rounded intermediate attenuation structure along the anterior interventricular septum in the vicinity of the anterior communicating artery/A1 segment on the right, concerning for potential aneurysm. Consider further assessment with outpatient MRI/MRA. Electronically Signed   By: Lovena Le M.D.   On: 10/16/2019 21:01   CT CERVICAL SPINE WO CONTRAST  Result Date: 10/16/2019 CLINICAL DATA:  Fall EXAM: CT HEAD WITHOUT CONTRAST CT CERVICAL SPINE WITHOUT CONTRAST TECHNIQUE: Multidetector CT imaging of the head and cervical spine was performed following the standard protocol without intravenous contrast. Multiplanar CT image reconstructions of the cervical spine were also generated. COMPARISON:  None. FINDINGS: CT HEAD FINDINGS Brain: No evidence of acute infarction, hemorrhage, hydrocephalus, extra-axial collection, visible mass lesion or mass effect. Symmetric prominence of the ventricles, cisterns and sulci compatible with  parenchymal volume loss. Patchy areas of white matter hypoattenuation are most compatible with chronic microvascular angiopathy. Vascular: 8 mm rounded intermediate attenuation (42 HU) structure along the anterior interventricular septum in the vicinity of the anterior communicating artery/A1 segment on the right, concerning for potential aneurysm. Calcifications of the carotid siphons and intradural vertebral arteries are noted as well. No concerning hyperdense vessel. Skull: No calvarial fracture or suspicious osseous lesion. No scalp swelling or hematoma. Small mineralization along the superolateral aspect of the left orbit, could reflect a benign dermal calcification. Sinuses/Orbits: Paranasal sinuses and mastoid air cells are predominantly clear. Likely remote deformity of the nasal bones in the absence of overlying swelling. Could correlate for point tenderness. Included orbital structures are unremarkable. Other: None CT CERVICAL SPINE FINDINGS Alignment: Stabilization collar is absent at the time of examination. There is straightening and slight reversal the normal cervical lordosis which is centered at the C5 level. 3 mm anterolisthesis C3 on C4. Stepwise 1-2 mm retrolisthesis C5-C7. 4 mm anterolisthesis C7 on T1. Spondylolisthesis is favored to be on a degenerative basis given associated endplate changes and facet degenerative features at these levels. No evidence of traumatic listhesis. No abnormally widened, perched or jumped facets. Normal alignment of the craniocervical and atlantoaxial articulations accounting for rightward cranial rotation. Skull base and vertebrae: No acute skull base fracture. No vertebral body fracture or height loss. Normal bone mineralization. No worrisome osseous lesions. Multilevel Schmorl's node formations are present. Moderate arthrosis at the atlantodental and basion dens intervals. Spondylitic changes of the spine detailed further above and below. Soft tissues and  spinal  canal: No pre or paravertebral fluid or swelling. No visible canal hematoma. Disc levels: Multilevel intervertebral disc height loss with spondylitic endplate changes. Features most pronounced C5-6 where a slightly larger disc osteophyte complex results in some mild to moderate canal stenosis. Additional mild narrowings noted C4-5, and C6-T1. Multilevel uncinate spurring and facet hypertrophic changes are present throughout the cervical spine as well resulting in some mild multilevel neural foraminal narrowing with more moderate narrowing bilaterally C5-6. Upper chest: No acute abnormality in the upper chest or imaged lung apices. Other: Cervical carotid atherosclerosis. No concerning thyroid nodules. IMPRESSION: 1. No acute intracranial abnormality. No scalp swelling or calvarial fracture. 2. Likely remote deformity of the nasal bones in the absence of overlying swelling. Could correlate for point tenderness. 3. No evidence of acute fracture or traumatic listhesis of the cervical spine. 4. Spondylitic changes of the cervical spine, as detailed above. Maximal changes resulting in mild-to-moderate canal stenosis and foraminal narrowing C5-6. 5. Cervical and intracranial atherosclerosis. 6. 8 mm rounded intermediate attenuation structure along the anterior interventricular septum in the vicinity of the anterior communicating artery/A1 segment on the right, concerning for potential aneurysm. Consider further assessment with outpatient MRI/MRA. Electronically Signed   By: Lovena Le M.D.   On: 10/16/2019 21:01   DG Chest Port 1 View  Result Date: 10/16/2019 CLINICAL DATA:  Fall, pain. EXAM: PORTABLE CHEST 1 VIEW COMPARISON:  Chest radiograph 10/02/2011 FINDINGS: The cardiomediastinal contours are normal. Aortic atherosclerosis. Pulmonary vasculature is normal. No consolidation, pleural effusion, or pneumothorax. No acute osseous abnormalities are seen. Mild chronic change about the right shoulder. IMPRESSION: No  acute chest findings. Electronically Signed   By: Keith Rake M.D.   On: 10/16/2019 20:39   DG Knee Complete 4 Views Right  Result Date: 10/16/2019 CLINICAL DATA:  Trip and fall this morning.  Right knee laceration. EXAM: RIGHT KNEE - COMPLETE 4+ VIEW COMPARISON:  None. FINDINGS: No evidence of fracture or dislocation. Trace joint effusion. Mild tricompartmental peripheral spurring. Chondrocalcinosis in the lateral tibiofemoral compartment. There is generalized soft tissue edema. Small amount of soft tissue air in the anterior knee soft tissues. IMPRESSION: 1. No fracture or subluxation of the right knee. 2. Generalized soft tissue edema with small amount of soft tissue air in the anterior knee soft tissues consistent with laceration. 3. Chondrocalcinosis in the lateral tibiofemoral compartment. Mild osteoarthritis. Electronically Signed   By: Keith Rake M.D.   On: 10/16/2019 20:37     Scheduled Meds: . vitamin C  1,000 mg Oral Daily  . folic acid  1 mg Oral Daily  . gabapentin  100 mg Oral BID  . heparin  5,000 Units Subcutaneous Q8H  . insulin aspart protamine- aspart  25 Units Subcutaneous Q supper  . insulin aspart protamine- aspart  35 Units Subcutaneous Q breakfast  . latanoprost  1 drop Both Eyes QHS  . levothyroxine  75 mcg Oral Daily  . multivitamin with minerals  1 tablet Oral Daily  . pantoprazole  40 mg Oral Daily  . thiamine  100 mg Oral Daily   Continuous Infusions: . cefTRIAXone (ROCEPHIN)  IV    . lactated ringers 125 mL/hr at 10/17/19 0122     LOS: 0 days    Time spent: 70  Nita Sells, MD [facility] To contact the attending provider between 7A-7P or the covering provider during after hours 7P-7A, please log into the web site [email or web address] and access using universal [place] password for that web site.  If you do not have the password, please call the hospital operator.  10/17/2019, 7:39 AM

## 2019-10-17 NOTE — Progress Notes (Signed)
   10/17/19 1059  Orthostatic Lying   BP- Lying 109/55  Pulse- Lying 81  Orthostatic Sitting  BP- Sitting 109/63  Pulse- Sitting 80  Orthostatic Standing at 0 minutes  BP- Standing at 0 minutes 124/65  Pulse- Standing at 0 minutes 112

## 2019-10-17 NOTE — ED Notes (Signed)
ED TO INPATIENT HANDOFF REPORT  Name/Age/Gender Jessica Dennis 86 y.o. female  Code Status    Code Status Orders  (From admission, onward)         Start     Ordered   10/16/19 2315  Full code  Continuous        10/16/19 2318        Code Status History    This patient has a current code status but no historical code status.   Advance Care Planning Activity      Home/SNF/Other Home  Chief Complaint AKI (acute kidney injury) ([place]) [N17.9]  Level of Care/Admitting Diagnosis ED Disposition    ED Disposition Condition Comment   Admit  Hospital Area: [facility] [[id number]]  Level of Care: Telemetry [5]  Admit to tele based on following criteria: Other see comments  Comments: tachycardia  Covid Evaluation: Asymptomatic Screening Protocol (No Symptoms)  Diagnosis: AKI (acute kidney injury) [name] [[id number]]  Admitting Physician: Monna Fam  Attending Physician: Karie Kirks [phone]       Medical History Past Medical History:  Diagnosis Date  . Anemia   . Angiodysplasia of ascending colon 10/25/2014  . Anxiety   . Arthritis   . Borderline diabetes   . Cancer of the skin, basal cell 09/03/2012  . Cirrhosis ([place])   . Diabetes mellitus without complication ([place])   . Diverticular disease   . 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 GERD (gastroesophageal reflux disease)   . Hypertension   . Iron deficiency anemia due to chronic blood loss 01/19/2015  . Portal hypertensive gastropathy ([place]) 08/02/2016   ? Some GAVE also  . Thyroid disease     Allergies Allergies  Allergen Reactions  . Tizanidine     Hallucinate, confused     IV Location/Drains/Wounds Patient Lines/Drains/Airways Status    Active Line/Drains/Airways    Name Placement date Placement time Site Days   Peripheral IV 10/16/19 Left Antecubital 10/16/19  --  Antecubital  1          Labs/Imaging Results for orders placed or performed during the hospital  encounter of 10/16/19 (from the past 48 hour(s))  CBC     Status: Abnormal   Collection Time: 10/16/19  7:58 PM  Result Value Ref Range   WBC 12.5 (H) 4.0 - 10.5 K/uL   RBC 3.65 (L) 3.87 - 5.11 MIL/uL   Hemoglobin 9.9 (L) 12.0 - 15.0 g/dL   HCT 31.8 (L) 36 - 46 %   MCV 87.1 80.0 - 100.0 fL   MCH 27.1 26.0 - 34.0 pg   MCHC 31.1 30.0 - 36.0 g/dL   RDW 14.0 11.5 - 15.5 %   Platelets 69 (L) 150 - 400 K/uL    Comment: REPEATED TO VERIFY PLATELET COUNT CONFIRMED BY SMEAR SPECIMEN CHECKED FOR CLOTS P    nRBC 0.0 0.0 - 0.2 %    Comment: Performed at [facility], [place] [address], [place], [place] [address]  Comprehensive metabolic panel     Status: Abnormal   Collection Time: 10/16/19  7:58 PM  Result Value Ref Range   Sodium 136 135 - 145 mmol/L   Potassium 5.4 (H) 3.5 - 5.1 mmol/L   Chloride 101 98 - 111 mmol/L   CO2 20 (L) 22 - 32 mmol/L   Glucose, Bld 217 (H) 70 - 99 mg/dL    Comment: Glucose reference range applies only to samples taken after fasting for at least 8 hours.  BUN 30 (H) 8 - 23 mg/dL   Creatinine, Ser 1.35 (H) 0.44 - 1.00 mg/dL   Calcium 10.1 8.9 - 10.3 mg/dL   Total Protein 6.5 6.5 - 8.1 g/dL   Albumin 3.5 3.5 - 5.0 g/dL   AST 87 (H) 15 - 41 U/L   ALT 34 0 - 44 U/L   Alkaline Phosphatase 96 38 - 126 U/L   Total Bilirubin 2.9 (H) 0.3 - 1.2 mg/dL   GFR calc non Af Amer 36 (L) >60 mL/min   GFR calc Af Amer 42 (L) >60 mL/min   Anion gap 15 5 - 15    Comment: Performed at [facility], [place] [address] [place], [place] [address]  Protime-INR     Status: Abnormal   Collection Time: 10/16/19  7:58 PM  Result Value Ref Range   Prothrombin Time 16.9 (H) 11.4 - 15.2 seconds   INR 1.4 (H) 0.8 - 1.2    Comment: (NOTE) INR goal varies based on device and disease states. Performed at [facility], [place] [address] [place], [place] [address]   Ammonia     Status: Abnormal   Collection Time: 10/16/19  7:58 PM   Result Value Ref Range   Ammonia 46 (H) 9 - 35 umol/L    Comment: SLIGHT HEMOLYSIS Performed at [facility], [place] [address] [place], [place] [address]   CK     Status: Abnormal   Collection Time: 10/16/19  7:58 PM  Result Value Ref Range   Total CK 1,431 (H) 38.0 - 234.0 U/L    Comment: Performed at [facility], [place] [address] [place], [place] [address]  Urinalysis, Routine w reflex microscopic     Status: Abnormal   Collection Time: 10/16/19 10:15 PM  Result Value Ref Range   Color, Urine YELLOW YELLOW   APPearance CLEAR CLEAR   Specific Gravity, Urine 1.015 1.005 - 1.030   pH 5.0 5.0 - 8.0   Glucose, UA NEGATIVE NEGATIVE mg/dL   Hgb urine dipstick NEGATIVE NEGATIVE   Bilirubin Urine NEGATIVE NEGATIVE   Ketones, ur 5 (A) NEGATIVE mg/dL   Protein, ur NEGATIVE NEGATIVE mg/dL   Nitrite POSITIVE (A) NEGATIVE   Leukocytes,Ua MODERATE (A) NEGATIVE   RBC / HPF 0-5 0 - 5 RBC/hpf   WBC, UA 21-50 0 - 5 WBC/hpf   Bacteria, UA MANY (A) NONE SEEN   Squamous Epithelial / LPF 0-5 0 - 5   Mucus PRESENT    Hyaline Casts, UA PRESENT     Comment: Performed at [facility], [place] [address], [place],  [address]   CT HEAD WO CONTRAST  Result Date: 10/16/2019 CLINICAL DATA:  Fall EXAM: CT HEAD WITHOUT CONTRAST CT CERVICAL SPINE WITHOUT CONTRAST TECHNIQUE: Multidetector CT imaging of the head and cervical spine was performed following the standard protocol without intravenous contrast. Multiplanar CT image reconstructions of the cervical spine were also generated. COMPARISON:  None. FINDINGS: CT HEAD FINDINGS Brain: No evidence of acute infarction, hemorrhage, hydrocephalus, extra-axial collection, visible mass lesion or mass effect. Symmetric prominence of the ventricles, cisterns and sulci compatible with parenchymal volume loss. Patchy areas of white matter hypoattenuation are most compatible with chronic microvascular angiopathy.  Vascular: 8 mm rounded intermediate attenuation (42 HU) structure along the anterior interventricular septum in the vicinity of the anterior communicating artery/A1 segment on the right, concerning for potential aneurysm. Calcifications of the carotid siphons and intradural vertebral arteries are noted as  well. No concerning hyperdense vessel. Skull: No calvarial fracture or suspicious osseous lesion. No scalp swelling or hematoma. Small mineralization along the superolateral aspect of the left orbit, could reflect a benign dermal calcification. Sinuses/Orbits: Paranasal sinuses and mastoid air cells are predominantly clear. Likely remote deformity of the nasal bones in the absence of overlying swelling. Could correlate for point tenderness. Included orbital structures are unremarkable. Other: None CT CERVICAL SPINE FINDINGS Alignment: Stabilization collar is absent at the time of examination. There is straightening and slight reversal the normal cervical lordosis which is centered at the C5 level. 3 mm anterolisthesis C3 on C4. Stepwise 1-2 mm retrolisthesis C5-C7. 4 mm anterolisthesis C7 on T1. Spondylolisthesis is favored to be on a degenerative basis given associated endplate changes and facet degenerative features at these levels. No evidence of traumatic listhesis. No abnormally widened, perched or jumped facets. Normal alignment of the craniocervical and atlantoaxial articulations accounting for rightward cranial rotation. Skull base and vertebrae: No acute skull base fracture. No vertebral body fracture or height loss. Normal bone mineralization. No worrisome osseous lesions. Multilevel Schmorl's node formations are present. Moderate arthrosis at the atlantodental and basion dens intervals. Spondylitic changes of the spine detailed further above and below. Soft tissues and spinal canal: No pre or paravertebral fluid or swelling. No visible canal hematoma. Disc levels: Multilevel intervertebral disc height  loss with spondylitic endplate changes. Features most pronounced C5-6 where a slightly larger disc osteophyte complex results in some mild to moderate canal stenosis. Additional mild narrowings noted C4-5, and C6-T1. Multilevel uncinate spurring and facet hypertrophic changes are present throughout the cervical spine as well resulting in some mild multilevel neural foraminal narrowing with more moderate narrowing bilaterally C5-6. Upper chest: No acute abnormality in the upper chest or imaged lung apices. Other: Cervical carotid atherosclerosis. No concerning thyroid nodules. IMPRESSION: 1. No acute intracranial abnormality. No scalp swelling or calvarial fracture. 2. Likely remote deformity of the nasal bones in the absence of overlying swelling. Could correlate for point tenderness. 3. No evidence of acute fracture or traumatic listhesis of the cervical spine. 4. Spondylitic changes of the cervical spine, as detailed above. Maximal changes resulting in mild-to-moderate canal stenosis and foraminal narrowing C5-6. 5. Cervical and intracranial atherosclerosis. 6. 8 mm rounded intermediate attenuation structure along the anterior interventricular septum in the vicinity of the anterior communicating artery/A1 segment on the right, concerning for potential aneurysm. Consider further assessment with outpatient MRI/MRA. Electronically Signed   By: Lovena Le M.D.   On: 10/16/2019 21:01   CT CERVICAL SPINE WO CONTRAST  Result Date: 10/16/2019 CLINICAL DATA:  Fall EXAM: CT HEAD WITHOUT CONTRAST CT CERVICAL SPINE WITHOUT CONTRAST TECHNIQUE: Multidetector CT imaging of the head and cervical spine was performed following the standard protocol without intravenous contrast. Multiplanar CT image reconstructions of the cervical spine were also generated. COMPARISON:  None. FINDINGS: CT HEAD FINDINGS Brain: No evidence of acute infarction, hemorrhage, hydrocephalus, extra-axial collection, visible mass lesion or mass effect.  Symmetric prominence of the ventricles, cisterns and sulci compatible with parenchymal volume loss. Patchy areas of white matter hypoattenuation are most compatible with chronic microvascular angiopathy. Vascular: 8 mm rounded intermediate attenuation (42 HU) structure along the anterior interventricular septum in the vicinity of the anterior communicating artery/A1 segment on the right, concerning for potential aneurysm. Calcifications of the carotid siphons and intradural vertebral arteries are noted as well. No concerning hyperdense vessel. Skull: No calvarial fracture or suspicious osseous lesion. No scalp swelling or hematoma. Small mineralization along  the superolateral aspect of the left orbit, could reflect a benign dermal calcification. Sinuses/Orbits: Paranasal sinuses and mastoid air cells are predominantly clear. Likely remote deformity of the nasal bones in the absence of overlying swelling. Could correlate for point tenderness. Included orbital structures are unremarkable. Other: None CT CERVICAL SPINE FINDINGS Alignment: Stabilization collar is absent at the time of examination. There is straightening and slight reversal the normal cervical lordosis which is centered at the C5 level. 3 mm anterolisthesis C3 on C4. Stepwise 1-2 mm retrolisthesis C5-C7. 4 mm anterolisthesis C7 on T1. Spondylolisthesis is favored to be on a degenerative basis given associated endplate changes and facet degenerative features at these levels. No evidence of traumatic listhesis. No abnormally widened, perched or jumped facets. Normal alignment of the craniocervical and atlantoaxial articulations accounting for rightward cranial rotation. Skull base and vertebrae: No acute skull base fracture. No vertebral body fracture or height loss. Normal bone mineralization. No worrisome osseous lesions. Multilevel Schmorl's node formations are present. Moderate arthrosis at the atlantodental and basion dens intervals. Spondylitic  changes of the spine detailed further above and below. Soft tissues and spinal canal: No pre or paravertebral fluid or swelling. No visible canal hematoma. Disc levels: Multilevel intervertebral disc height loss with spondylitic endplate changes. Features most pronounced C5-6 where a slightly larger disc osteophyte complex results in some mild to moderate canal stenosis. Additional mild narrowings noted C4-5, and C6-T1. Multilevel uncinate spurring and facet hypertrophic changes are present throughout the cervical spine as well resulting in some mild multilevel neural foraminal narrowing with more moderate narrowing bilaterally C5-6. Upper chest: No acute abnormality in the upper chest or imaged lung apices. Other: Cervical carotid atherosclerosis. No concerning thyroid nodules. IMPRESSION: 1. No acute intracranial abnormality. No scalp swelling or calvarial fracture. 2. Likely remote deformity of the nasal bones in the absence of overlying swelling. Could correlate for point tenderness. 3. No evidence of acute fracture or traumatic listhesis of the cervical spine. 4. Spondylitic changes of the cervical spine, as detailed above. Maximal changes resulting in mild-to-moderate canal stenosis and foraminal narrowing C5-6. 5. Cervical and intracranial atherosclerosis. 6. 8 mm rounded intermediate attenuation structure along the anterior interventricular septum in the vicinity of the anterior communicating artery/A1 segment on the right, concerning for potential aneurysm. Consider further assessment with outpatient MRI/MRA. Electronically Signed   By: Lovena Le M.D.   On: 10/16/2019 21:01   DG Chest Port 1 View  Result Date: 10/16/2019 CLINICAL DATA:  Fall, pain. EXAM: PORTABLE CHEST 1 VIEW COMPARISON:  Chest radiograph 10/02/2011 FINDINGS: The cardiomediastinal contours are normal. Aortic atherosclerosis. Pulmonary vasculature is normal. No consolidation, pleural effusion, or pneumothorax. No acute osseous  abnormalities are seen. Mild chronic change about the right shoulder. IMPRESSION: No acute chest findings. Electronically Signed   By: Keith Rake M.D.   On: 10/16/2019 20:39   DG Knee Complete 4 Views Right  Result Date: 10/16/2019 CLINICAL DATA:  Trip and fall this morning.  Right knee laceration. EXAM: RIGHT KNEE - COMPLETE 4+ VIEW COMPARISON:  None. FINDINGS: No evidence of fracture or dislocation. Trace joint effusion. Mild tricompartmental peripheral spurring. Chondrocalcinosis in the lateral tibiofemoral compartment. There is generalized soft tissue edema. Small amount of soft tissue air in the anterior knee soft tissues. IMPRESSION: 1. No fracture or subluxation of the right knee. 2. Generalized soft tissue edema with small amount of soft tissue air in the anterior knee soft tissues consistent with laceration. 3. Chondrocalcinosis in the lateral tibiofemoral compartment. Mild osteoarthritis.  Electronically Signed   By: Keith Rake M.D.   On: 10/16/2019 20:37    Pending Labs Unresulted Labs (From admission, onward)          Start     Ordered   10/17/19 0008  CBC  Once,   R        10/17/19 0008   10/17/19 0008  Comprehensive metabolic panel  Once,   R        10/17/19 0008   10/16/19 2322  Culture, blood (routine x 2)  BLOOD CULTURE X 2,   R (with STAT occurrences)      10/16/19 2321   10/16/19 2258  Urine Culture  Once,   STAT        10/16/19 2257   10/16/19 2257  Respiratory Panel by RT PCR (Flu A&B, Covid) - Nasopharyngeal Swab  (Tier 2 (TAT 2 hrs))  Once,   STAT       Question Answer Comment  Is this test for diagnosis or screening Screening   Symptomatic for COVID-19 as defined by CDC No   Hospitalized for COVID-19 No   Admitted to ICU for COVID-19 No   Previously tested for COVID-19 Yes   Resident in a congregate (group) care setting No   Employed in healthcare setting No   Pregnant No   Has patient completed COVID vaccination(s) (2 doses of Pfizer/Moderna 1 dose of  [employer]) Unknown      10/16/19 2256          Vitals/Pain Today's Vitals   10/16/19 2230 10/16/19 2300 10/16/19 2350 10/17/19 0000  BP: (!) 165/87 (!) 166/83  140/64  Pulse: (!) 111 (!) 124  (!) 115  Resp: 18 18  18   Temp:    98.5 F (36.9 C)  TempSrc:    Oral  SpO2: 98% 99%  96%  PainSc:   0-No pain 0-No pain    Isolation Precautions No active isolations  Medications Medications  heparin injection 5,000 Units (has no administration in time range)  traMADol (ULTRAM) tablet 50 mg (has no administration in time range)  HYDROcodone-acetaminophen (NORCO/VICODIN) 5-325 MG per tablet 1-2 tablet (has no administration in time range)  ondansetron (ZOFRAN) tablet 4 mg (has no administration in time range)    Or  ondansetron (ZOFRAN) injection 4 mg (has no administration in time range)  folic acid (FOLVITE) tablet 1 mg (has no administration in time range)  multivitamin with minerals tablet 1 tablet (has no administration in time range)  thiamine tablet 100 mg (has no administration in time range)  lactated ringers infusion (has no administration in time range)  cefTRIAXone (ROCEPHIN) 1 g in sodium chloride 0.9 % 100 mL IVPB (has no administration in time range)  levothyroxine (SYNTHROID) tablet 75 mcg (has no administration in time range)  insulin aspart protamine- aspart (NOVOLOG MIX 70/30) injection 35 Units (has no administration in time range)  insulin aspart protamine- aspart (NOVOLOG MIX 70/30) injection 25 Units (has no administration in time range)  pantoprazole (PROTONIX) EC tablet 40 mg (has no administration in time range)  gabapentin (NEURONTIN) capsule 100 mg (has no administration in time range)  ascorbic acid (VITAMIN C) tablet 1,000 mg (has no administration in time range)  latanoprost (XALATAN) 0.005 % ophthalmic solution 1 drop (has no administration in time range)  sodium chloride 0.9 % bolus 1,000 mL (0 mLs Intravenous Stopped 10/16/19 2215)  ceFAZolin  (ANCEF) IVPB 1 g/50 mL premix (0 g Intravenous Stopped 10/16/19 2215)  lidocaine-EPINEPHrine (XYLOCAINE W/EPI) 2 %-1:100000 (with pres) injection 20 mL (20 mLs Intradermal Given by Other 10/16/19 2234)  sodium chloride 0.9 % bolus 1,000 mL (0 mLs Intravenous Stopped 10/16/19 2333)  cefTRIAXone (ROCEPHIN) 1 g in sodium chloride 0.9 % 100 mL IVPB (0 g Intravenous Stopped 10/17/19 0009)    Mobility walks

## 2019-10-18 LAB — CBC WITH DIFFERENTIAL/PLATELET
Abs Immature Granulocytes: 0.01 10*3/uL (ref 0.00–0.07)
Basophils Absolute: 0 10*3/uL (ref 0.0–0.1)
Basophils Relative: 0 %
Eosinophils Absolute: 0.1 10*3/uL (ref 0.0–0.5)
Eosinophils Relative: 2 %
HCT: 29 % — ABNORMAL LOW (ref 36.0–46.0)
Hemoglobin: 8.9 g/dL — ABNORMAL LOW (ref 12.0–15.0)
Immature Granulocytes: 0 %
Lymphocytes Relative: 14 %
Lymphs Abs: 0.7 10*3/uL (ref 0.7–4.0)
MCH: 27.6 pg (ref 26.0–34.0)
MCHC: 30.7 g/dL (ref 30.0–36.0)
MCV: 89.8 fL (ref 80.0–100.0)
Monocytes Absolute: 0.4 10*3/uL (ref 0.1–1.0)
Monocytes Relative: 7 %
Neutro Abs: 3.9 10*3/uL (ref 1.7–7.7)
Neutrophils Relative %: 77 %
Platelets: 49 10*3/uL — ABNORMAL LOW (ref 150–400)
RBC: 3.23 MIL/uL — ABNORMAL LOW (ref 3.87–5.11)
RDW: 14.4 % (ref 11.5–15.5)
WBC: 5.2 10*3/uL (ref 4.0–10.5)
nRBC: 0 % (ref 0.0–0.2)

## 2019-10-18 LAB — COMPREHENSIVE METABOLIC PANEL
ALT: 26 U/L (ref 0–44)
AST: 56 U/L — ABNORMAL HIGH (ref 15–41)
Albumin: 2.9 g/dL — ABNORMAL LOW (ref 3.5–5.0)
Alkaline Phosphatase: 76 U/L (ref 38–126)
Anion gap: 6 (ref 5–15)
BUN: 23 mg/dL (ref 8–23)
CO2: 23 mmol/L (ref 22–32)
Calcium: 8.6 mg/dL — ABNORMAL LOW (ref 8.9–10.3)
Chloride: 104 mmol/L (ref 98–111)
Creatinine, Ser: 0.98 mg/dL (ref 0.44–1.00)
GFR calc Af Amer: >60 mL/min (ref >60)
GFR calc non Af Amer: 53 mL/min — ABNORMAL LOW (ref >60)
Glucose, Bld: 137 mg/dL — ABNORMAL HIGH (ref 70–99)
Potassium: 4.3 mmol/L (ref 3.5–5.1)
Sodium: 133 mmol/L — ABNORMAL LOW (ref 135–145)
Total Bilirubin: 1.9 mg/dL — ABNORMAL HIGH (ref 0.3–1.2)
Total Protein: 5.5 g/dL — ABNORMAL LOW (ref 6.5–8.1)

## 2019-10-18 LAB — GLUCOSE, CAPILLARY
Glucose-Capillary: 140 mg/dL — ABNORMAL HIGH (ref 70–99)
Glucose-Capillary: 189 mg/dL — ABNORMAL HIGH (ref 70–99)
Glucose-Capillary: 161 mg/dL — ABNORMAL HIGH (ref 70–99)

## 2019-10-18 LAB — CK: Total CK: 208 U/L (ref 38–234)

## 2019-10-18 MED ORDER — METOPROLOL SUCCINATE ER 25 MG PO TB24
12.5000 mg | ORAL_TABLET | Freq: Every day | ORAL | Status: DC
  Administered 2019-10-18 – 2019-10-19 (×2): 12.5 mg via ORAL
  Filled 2019-10-18 (×3): qty 1

## 2019-10-18 NOTE — Progress Notes (Signed)
   10/17/19 2146  Vitals  Temp 98.4 F (36.9 C)  Temp Source Oral  BP (!) 111/49  MAP (mmHg) 69  BP Location Right Arm  BP Method Automatic  Patient Position (if appropriate) Lying  Pulse Rate (!) 44  Pulse Rate Source Monitor  Resp 16  MEWS COLOR  MEWS Score Color Green  Oxygen Therapy  SpO2 95 %  O2 Device Room Air  MEWS Score  MEWS Temp 0  MEWS Systolic 0  MEWS Pulse 1  MEWS RR 0  MEWS LOC 0  MEWS Score 1

## 2019-10-18 NOTE — Evaluation (Signed)
Physical Therapy Evaluation Patient Details Name: Jessica Dennis MRN: [id number] DOB: 09-Sep-1932 Today's Date: 10/18/2019   History of Present Illness  Pt admitted s/p fall at home and after spending 10 hrs on floor.  Pt with muliple contusions and with lacerations to R knee now sutured.  Pt with hx of DM , Cirrhosis, and bil THR  Clinical Impression  Pt admitted as above and presenting with functional mobility limitations 2* generalized weakness, R knee pain, balance deficits and limited endurance.  Pt currently requiring significant assist for performance of all basic mobility tasks.  Pt would benefit from SNF level rehab to maximize IND and safety prior to return home - dependent on acute stay progress and level of assist available at home.    Follow Up Recommendations SNF    Equipment Recommendations  None recommended by PT    Recommendations for Other Services OT consult     Precautions / Restrictions Precautions Precautions: Fall Restrictions Weight Bearing Restrictions: No      Mobility  Bed Mobility Overal bed mobility: Needs Assistance Bed Mobility: Supine to Sit;Sit to Supine     Supine to sit: Min assist;Mod assist;+2 for physical assistance;+2 for safety/equipment;HOB elevated Sit to supine: Min assist;Mod assist;+2 for physical assistance;+2 for safety/equipment   General bed mobility comments: Increased time with cues for sequence, Pt utilizing bed rails and with physical assist to manage R LE, to control trunk and to rotate to EOB using pad  Transfers Overall transfer level: Needs assistance Equipment used: Rolling walker (2 wheeled) Transfers: Sit to/from [facility] Sit to Stand: Min assist;Mod assist;From elevated surface Stand pivot transfers: Min assist;Mod assist       General transfer comment: cues for use of UEs to self assist; physical assist to bring wt up and fwd and to balance in standing with  RW  Ambulation/Gait Ambulation/Gait assistance: Min assist;+2 safety/equipment Gait Distance (Feet): 5 Feet Assistive device: Rolling walker (2 wheeled) Gait Pattern/deviations: Step-to pattern;Decreased step length - right;Decreased step length - left;Shuffle;Trunk flexed Gait velocity: decr   General Gait Details: cues for posture, position from RW and safety awareness.  Physical assist for balance and RW management; Distance ltd by fatigue  Stairs            Wheelchair Mobility    Modified Rankin (Stroke Patients Only)       Balance Overall balance assessment: Needs assistance Sitting-balance support: No upper extremity supported;Feet supported Sitting balance-Leahy Scale: Fair     Standing balance support: Bilateral upper extremity supported Standing balance-Leahy Scale: Poor                               Pertinent Vitals/Pain Pain Assessment: Faces Faces Pain Scale: Hurts little more Pain Location: R knee with movement ; AND all over, "everything hurts" Pain Descriptors / Indicators: Aching;Grimacing;Guarding;Sore Pain Intervention(s): Limited activity within patient's tolerance;Monitored during session    [place] expects to be discharged to:: Private residence Living Arrangements: Alone Available Help at Discharge: Other (Comment) (pt is vague on help available) Type of Home: House Home Access: Stairs to enter   [employer] of Steps: 1+1 Home Layout: One level Home Equipment: [place] - 2 wheels;Cane - single point;Bedside commode      Prior Function Level of Independence: Independent               Hand Dominance        Extremity/Trunk Assessment  Upper Extremity Assessment Upper Extremity Assessment: Generalized weakness    Lower Extremity Assessment Lower Extremity Assessment: Generalized weakness;RLE deficits/detail RLE Deficits / Details: Knee flex pain ltd to 25 degrees RLE: Unable to  fully assess due to pain       Communication   Communication: No difficulties  Cognition Arousal/Alertness: Awake/alert Behavior During Therapy: WFL for tasks assessed/performed Overall Cognitive Status: Within Functional Limits for tasks assessed                                 General Comments: Pt following cues and answering questions appropriately but with intermettent delayed responses      General Comments      Exercises     Assessment/Plan    PT Assessment Patient needs continued PT services  PT Problem List Decreased strength;Decreased range of motion;Decreased activity tolerance;Decreased balance;Decreased mobility;Decreased cognition;Decreased knowledge of use of DME;Pain;Obesity;Decreased safety awareness       PT Treatment Interventions DME instruction;Gait training;Stair training;Functional mobility training;Therapeutic activities;Therapeutic exercise;Balance training;Patient/family education    PT Goals (Current goals can be found in the Care Plan section)  Acute Rehab PT Goals Patient Stated Goal: Regain IND PT Goal Formulation: With patient Time For Goal Achievement: 11/01/19 Potential to Achieve Goals: Fair    Frequency Min 3X/week   Barriers to discharge Decreased caregiver support Pt lives alone    Co-evaluation               AM-PAC PT "6 Clicks" Mobility  Outcome Measure Help needed turning from your back to your side while in a flat bed without using bedrails?: A Lot Help needed moving from lying on your back to sitting on the side of a flat bed without using bedrails?: A Lot Help needed moving to and from a bed to a chair (including a wheelchair)?: A Lot Help needed standing up from a chair using your arms (e.g., wheelchair or bedside chair)?: A Lot Help needed to walk in hospital room?: A Lot Help needed climbing 3-5 steps with a railing? : Total 6 Click Score: 11    End of Session Equipment Utilized During Treatment:  Gait belt Activity Tolerance: Patient tolerated treatment well;Patient limited by fatigue Patient left: in bed;with call bell/phone within reach;with bed alarm set;with nursing/sitter in room Nurse Communication: Mobility status PT Visit Diagnosis: Unsteadiness on feet (R26.81);Muscle weakness (generalized) (M62.81);Difficulty in walking, not elsewhere classified (R26.2)    Time: 5809-9833 PT Time Calculation (min) (ACUTE ONLY): 38 min   Charges:   PT Evaluation $PT Eval Low Complexity: 1 Low PT Treatments $Gait Training: 8-22 mins $Therapeutic Activity: 8-22 mins        Debe Coder PT Acute Rehabilitation Services Pager [phone] Office [phone]   Tawfiq Favila 10/18/2019, 4:12 PM

## 2019-10-18 NOTE — Progress Notes (Signed)
PROGRESS NOTE    Jessica Dennis  YBO:[id number] DOB: December 18, 1932 DOA: 10/16/2019 PCP: Ann Held, DO  Brief Narrative:   86 year old white lady Known history chronic TCP + cirrhosis + splenomegaly followed by Dr. Ivor Reining history of esophageal varices followed by Dr. Arelia Longest Essential tremor under work-up-suspicious nevus of skin, HTN, HLD, hypothyroid Increasing frequency of falls recently-fell on 10/16/2019 and remained on the floor for 10 hours 3 lacerations and right knee swollen ankl culture pendinge Work-up revealed sinus tach hypertension 150s Potassium 5.4 creatinine 1.3 above baseline 1.02 gap 15 CK 1431 White count 12.5 UA?  Urine cult neg  Assessment & Plan:   Active Problems:   GERD (gastroesophageal reflux disease)   Hyperlipidemia LDL goal <100   Thrombocytopenia (HCC)   Essential hypertension   Adult hypothyroidism   Alcoholic cirrhosis of liver with ascites (HCC)   GAVE (gastric antral vascular ectasia)   Hyperlipidemia associated with type 2 diabetes mellitus (HCC)   Generalized anxiety disorder   AKI (acute kidney injury) ([place])   Hypovolemia   Acute lower UTI   Rhabdomyolysis  1. Accidental fall a. Orthostatics are negative b. Lasix 20 quinapril 40 Aldactone 100 held c. Keep on telemetry has sinus tach today 2. Sinus tachycardia a. Felt initially secondary to volume depletion b. Start low-dose metoprolol XL 12.5 daily and monitor 3.  asymptomatic bacteriuria a. No dysuria no burning no pressure b. Urine was collected in a cup-discontinue antibiotics today 4. Alcoholic cirrhosis with thrombocytopenia which is chronic with gave and reflux a. Protonix 40 daily b. Outpatient follow-up with gastroenterology no concerns for bleeding at this time 5. General anxiety disorder a. Continue lorazepam 0.5 as needed if orthostatics are negative 6. Mild hyperkalemia AKI and rhabdomyolysis on admission a. Cut back LR to 50 cc/h as volume depletion is  better b. Labs improved 7. HTN a. Holding lisinopril etc. as above at this time check orthostatics 8. Hypothyroid a. Continue Synthroid 75 orally daily monitor as an outpatient 9. Diabetes mellitus with some underlying nephropathy a. Continue at this time 70/30 insulin 25 units p.m., 35 units a.m, sugars are controlled 1 37-1 43 b. Resume Metformin once kidney function is better c. Neuropathy continue gabapentin 100 twice daily 10. Prior squamous cell CA skin 11. Essential tremor  DVT prophylaxis: Lovenox Code Status: Full Family Communication: called and d/w with son Shelina Luo on cell phone [phone] Disposition:   Status is: Observation  The patient remains OBS appropriate and will d/c before 2 midnights.  Dispo: The patient is from: Home              Anticipated d/c is to: Home              Anticipated d/c date is: 2 days              Patient currently is not medically stable to d/c.    Consultants:   None yet  Procedures: None  Antimicrobials: Discontinued ceftriaxone   Subjective:  Awake coherent no distress slight confusion Feels like she has pain all over No chest pain No cough no cold no fever  Objective: Vitals:   10/17/19 0800 10/17/19 1323 10/17/19 2146 10/18/19 0605  BP:  (!) 118/57 (!) 111/49 112/74  Pulse:  81 (!) 44 84  Resp:  18 16 17   Temp:  98.2 F (36.8 C) 98.4 F (36.9 C) 98.2 F (36.8 C)  TempSrc:  Oral Oral Oral  SpO2: 95% 96% 95% 94%    Intake/Output  Summary (Last 24 hours) at 10/18/2019 1021 Last data filed at 10/18/2019 0230 Gross per 24 hour  Intake 2316.82 ml  Output 400 ml  Net 1916.82 ml   There were no vitals filed for this visit.  Examination:  Awake coherent EOMI NCAT no focal deficits mild symmetric Moving all 4 limbs equally Chest clear no added sound no rales no rhonchi Abdomen soft  Data Reviewed: I have personally reviewed following labs and imaging studies Sodium 133 BUN/creatinine down from  30/1.3-23/0.9 CK down from 1400-208 LFTs improved from 87/34-56/26 Hemoglobin 9.3-->8.9 Platelet 44-->49   Radiology Studies: CT HEAD WO CONTRAST  Result Date: 10/16/2019 CLINICAL DATA:  Fall EXAM: CT HEAD WITHOUT CONTRAST CT CERVICAL SPINE WITHOUT CONTRAST TECHNIQUE: Multidetector CT imaging of the head and cervical spine was performed following the standard protocol without intravenous contrast. Multiplanar CT image reconstructions of the cervical spine were also generated. COMPARISON:  None. FINDINGS: CT HEAD FINDINGS Brain: No evidence of acute infarction, hemorrhage, hydrocephalus, extra-axial collection, visible mass lesion or mass effect. Symmetric prominence of the ventricles, cisterns and sulci compatible with parenchymal volume loss. Patchy areas of white matter hypoattenuation are most compatible with chronic microvascular angiopathy. Vascular: 8 mm rounded intermediate attenuation (42 HU) structure along the anterior interventricular septum in the vicinity of the anterior communicating artery/A1 segment on the right, concerning for potential aneurysm. Calcifications of the carotid siphons and intradural vertebral arteries are noted as well. No concerning hyperdense vessel. Skull: No calvarial fracture or suspicious osseous lesion. No scalp swelling or hematoma. Small mineralization along the superolateral aspect of the left orbit, could reflect a benign dermal calcification. Sinuses/Orbits: Paranasal sinuses and mastoid air cells are predominantly clear. Likely remote deformity of the nasal bones in the absence of overlying swelling. Could correlate for point tenderness. Included orbital structures are unremarkable. Other: None CT CERVICAL SPINE FINDINGS Alignment: Stabilization collar is absent at the time of examination. There is straightening and slight reversal the normal cervical lordosis which is centered at the C5 level. 3 mm anterolisthesis C3 on C4. Stepwise 1-2 mm retrolisthesis C5-C7.  4 mm anterolisthesis C7 on T1. Spondylolisthesis is favored to be on a degenerative basis given associated endplate changes and facet degenerative features at these levels. No evidence of traumatic listhesis. No abnormally widened, perched or jumped facets. Normal alignment of the craniocervical and atlantoaxial articulations accounting for rightward cranial rotation. Skull base and vertebrae: No acute skull base fracture. No vertebral body fracture or height loss. Normal bone mineralization. No worrisome osseous lesions. Multilevel Schmorl's node formations are present. Moderate arthrosis at the atlantodental and basion dens intervals. Spondylitic changes of the spine detailed further above and below. Soft tissues and spinal canal: No pre or paravertebral fluid or swelling. No visible canal hematoma. Disc levels: Multilevel intervertebral disc height loss with spondylitic endplate changes. Features most pronounced C5-6 where a slightly larger disc osteophyte complex results in some mild to moderate canal stenosis. Additional mild narrowings noted C4-5, and C6-T1. Multilevel uncinate spurring and facet hypertrophic changes are present throughout the cervical spine as well resulting in some mild multilevel neural foraminal narrowing with more moderate narrowing bilaterally C5-6. Upper chest: No acute abnormality in the upper chest or imaged lung apices. Other: Cervical carotid atherosclerosis. No concerning thyroid nodules. IMPRESSION: 1. No acute intracranial abnormality. No scalp swelling or calvarial fracture. 2. Likely remote deformity of the nasal bones in the absence of overlying swelling. Could correlate for point tenderness. 3. No evidence of acute fracture or traumatic listhesis of  the cervical spine. 4. Spondylitic changes of the cervical spine, as detailed above. Maximal changes resulting in mild-to-moderate canal stenosis and foraminal narrowing C5-6. 5. Cervical and intracranial atherosclerosis. 6. 8 mm  rounded intermediate attenuation structure along the anterior interventricular septum in the vicinity of the anterior communicating artery/A1 segment on the right, concerning for potential aneurysm. Consider further assessment with outpatient MRI/MRA. Electronically Signed   By: Lovena Le M.D.   On: 10/16/2019 21:01   CT CERVICAL SPINE WO CONTRAST  Result Date: 10/16/2019 CLINICAL DATA:  Fall EXAM: CT HEAD WITHOUT CONTRAST CT CERVICAL SPINE WITHOUT CONTRAST TECHNIQUE: Multidetector CT imaging of the head and cervical spine was performed following the standard protocol without intravenous contrast. Multiplanar CT image reconstructions of the cervical spine were also generated. COMPARISON:  None. FINDINGS: CT HEAD FINDINGS Brain: No evidence of acute infarction, hemorrhage, hydrocephalus, extra-axial collection, visible mass lesion or mass effect. Symmetric prominence of the ventricles, cisterns and sulci compatible with parenchymal volume loss. Patchy areas of white matter hypoattenuation are most compatible with chronic microvascular angiopathy. Vascular: 8 mm rounded intermediate attenuation (42 HU) structure along the anterior interventricular septum in the vicinity of the anterior communicating artery/A1 segment on the right, concerning for potential aneurysm. Calcifications of the carotid siphons and intradural vertebral arteries are noted as well. No concerning hyperdense vessel. Skull: No calvarial fracture or suspicious osseous lesion. No scalp swelling or hematoma. Small mineralization along the superolateral aspect of the left orbit, could reflect a benign dermal calcification. Sinuses/Orbits: Paranasal sinuses and mastoid air cells are predominantly clear. Likely remote deformity of the nasal bones in the absence of overlying swelling. Could correlate for point tenderness. Included orbital structures are unremarkable. Other: None CT CERVICAL SPINE FINDINGS Alignment: Stabilization collar is absent  at the time of examination. There is straightening and slight reversal the normal cervical lordosis which is centered at the C5 level. 3 mm anterolisthesis C3 on C4. Stepwise 1-2 mm retrolisthesis C5-C7. 4 mm anterolisthesis C7 on T1. Spondylolisthesis is favored to be on a degenerative basis given associated endplate changes and facet degenerative features at these levels. No evidence of traumatic listhesis. No abnormally widened, perched or jumped facets. Normal alignment of the craniocervical and atlantoaxial articulations accounting for rightward cranial rotation. Skull base and vertebrae: No acute skull base fracture. No vertebral body fracture or height loss. Normal bone mineralization. No worrisome osseous lesions. Multilevel Schmorl's node formations are present. Moderate arthrosis at the atlantodental and basion dens intervals. Spondylitic changes of the spine detailed further above and below. Soft tissues and spinal canal: No pre or paravertebral fluid or swelling. No visible canal hematoma. Disc levels: Multilevel intervertebral disc height loss with spondylitic endplate changes. Features most pronounced C5-6 where a slightly larger disc osteophyte complex results in some mild to moderate canal stenosis. Additional mild narrowings noted C4-5, and C6-T1. Multilevel uncinate spurring and facet hypertrophic changes are present throughout the cervical spine as well resulting in some mild multilevel neural foraminal narrowing with more moderate narrowing bilaterally C5-6. Upper chest: No acute abnormality in the upper chest or imaged lung apices. Other: Cervical carotid atherosclerosis. No concerning thyroid nodules. IMPRESSION: 1. No acute intracranial abnormality. No scalp swelling or calvarial fracture. 2. Likely remote deformity of the nasal bones in the absence of overlying swelling. Could correlate for point tenderness. 3. No evidence of acute fracture or traumatic listhesis of the cervical spine. 4.  Spondylitic changes of the cervical spine, as detailed above. Maximal changes resulting in mild-to-moderate canal stenosis  and foraminal narrowing C5-6. 5. Cervical and intracranial atherosclerosis. 6. 8 mm rounded intermediate attenuation structure along the anterior interventricular septum in the vicinity of the anterior communicating artery/A1 segment on the right, concerning for potential aneurysm. Consider further assessment with outpatient MRI/MRA. Electronically Signed   By: Lovena Le M.D.   On: 10/16/2019 21:01   DG Chest Port 1 View  Result Date: 10/16/2019 CLINICAL DATA:  Fall, pain. EXAM: PORTABLE CHEST 1 VIEW COMPARISON:  Chest radiograph 10/02/2011 FINDINGS: The cardiomediastinal contours are normal. Aortic atherosclerosis. Pulmonary vasculature is normal. No consolidation, pleural effusion, or pneumothorax. No acute osseous abnormalities are seen. Mild chronic change about the right shoulder. IMPRESSION: No acute chest findings. Electronically Signed   By: Keith Rake M.D.   On: 10/16/2019 20:39   DG Knee Complete 4 Views Right  Result Date: 10/16/2019 CLINICAL DATA:  Trip and fall this morning.  Right knee laceration. EXAM: RIGHT KNEE - COMPLETE 4+ VIEW COMPARISON:  None. FINDINGS: No evidence of fracture or dislocation. Trace joint effusion. Mild tricompartmental peripheral spurring. Chondrocalcinosis in the lateral tibiofemoral compartment. There is generalized soft tissue edema. Small amount of soft tissue air in the anterior knee soft tissues. IMPRESSION: 1. No fracture or subluxation of the right knee. 2. Generalized soft tissue edema with small amount of soft tissue air in the anterior knee soft tissues consistent with laceration. 3. Chondrocalcinosis in the lateral tibiofemoral compartment. Mild osteoarthritis. Electronically Signed   By: Keith Rake M.D.   On: 10/16/2019 20:37     Scheduled Meds: . vitamin C  1,000 mg Oral Daily  . folic acid  1 mg Oral Daily  .  gabapentin  100 mg Oral BID  . heparin  5,000 Units Subcutaneous Q8H  . insulin aspart protamine- aspart  25 Units Subcutaneous Q supper  . insulin aspart protamine- aspart  35 Units Subcutaneous Q breakfast  . latanoprost  1 drop Both Eyes QHS  . levothyroxine  75 mcg Oral Daily  . multivitamin with minerals  1 tablet Oral Daily  . pantoprazole  40 mg Oral Daily  . thiamine  100 mg Oral Daily   Continuous Infusions: . lactated ringers 125 mL/hr at 10/18/19 0248     LOS: 1 day    Time spent: [place], MD [facility] To contact the attending provider between 7A-7P or the covering provider during after hours 7P-7A, please log into the web site [email or web address] and access using universal [place] password for that web site. If you do not have the password, please call the hospital operator.  10/18/2019, 10:21 AM

## 2019-10-19 LAB — COMPREHENSIVE METABOLIC PANEL
ALT: 26 U/L (ref 0–44)
AST: 52 U/L — ABNORMAL HIGH (ref 15–41)
Albumin: 2.7 g/dL — ABNORMAL LOW (ref 3.5–5.0)
Alkaline Phosphatase: 71 U/L (ref 38–126)
Anion gap: 10 (ref 5–15)
BUN: 18 mg/dL (ref 8–23)
CO2: 23 mmol/L (ref 22–32)
Calcium: 8.7 mg/dL — ABNORMAL LOW (ref 8.9–10.3)
Chloride: 102 mmol/L (ref 98–111)
Creatinine, Ser: 0.88 mg/dL (ref 0.44–1.00)
GFR calc Af Amer: >60 mL/min (ref >60)
GFR calc non Af Amer: >60 mL/min (ref >60)
Glucose, Bld: 110 mg/dL — ABNORMAL HIGH (ref 70–99)
Potassium: 4.2 mmol/L (ref 3.5–5.1)
Sodium: 135 mmol/L (ref 135–145)
Total Bilirubin: 1.6 mg/dL — ABNORMAL HIGH (ref 0.3–1.2)
Total Protein: 5.1 g/dL — ABNORMAL LOW (ref 6.5–8.1)

## 2019-10-19 LAB — CBC WITH DIFFERENTIAL/PLATELET
Abs Immature Granulocytes: 0.02 10*3/uL (ref 0.00–0.07)
Basophils Absolute: 0 10*3/uL (ref 0.0–0.1)
Basophils Relative: 1 %
Eosinophils Absolute: 0.2 10*3/uL (ref 0.0–0.5)
Eosinophils Relative: 2 %
HCT: 30.6 % — ABNORMAL LOW (ref 36.0–46.0)
Hemoglobin: 9.4 g/dL — ABNORMAL LOW (ref 12.0–15.0)
Immature Granulocytes: 0 %
Lymphocytes Relative: 14 %
Lymphs Abs: 0.9 10*3/uL (ref 0.7–4.0)
MCH: 27.4 pg (ref 26.0–34.0)
MCHC: 30.7 g/dL (ref 30.0–36.0)
MCV: 89.2 fL (ref 80.0–100.0)
Monocytes Absolute: 0.5 10*3/uL (ref 0.1–1.0)
Monocytes Relative: 7 %
Neutro Abs: 4.9 10*3/uL (ref 1.7–7.7)
Neutrophils Relative %: 76 %
Platelets: 61 10*3/uL — ABNORMAL LOW (ref 150–400)
RBC: 3.43 MIL/uL — ABNORMAL LOW (ref 3.87–5.11)
RDW: 14.5 % (ref 11.5–15.5)
WBC: 6.5 10*3/uL (ref 4.0–10.5)
nRBC: 0 % (ref 0.0–0.2)

## 2019-10-19 LAB — URINE CULTURE: Culture: >=100000 — AB

## 2019-10-19 MED ORDER — METFORMIN HCL ER 500 MG PO TB24
500.0000 mg | ORAL_TABLET | Freq: Every day | ORAL | Status: DC
  Administered 2019-10-20: 500 mg via ORAL
  Filled 2019-10-19 (×2): qty 1

## 2019-10-19 MED ORDER — LIP MEDEX EX OINT: TOPICAL_OINTMENT | CUTANEOUS | Status: DC | PRN

## 2019-10-19 MED ORDER — LIP MEDEX EX OINT
TOPICAL_OINTMENT | CUTANEOUS | Status: AC
  Filled 2019-10-19: qty 7

## 2019-10-19 NOTE — TOC Initial Note (Addendum)
Transition of Care [facility]) - Initial/Assessment Note    Patient Details  Name: Jessica Dennis MRN: [id number] Date of Birth: Sep 23, 1932  Transition of Care [facility]) CM/SW Contact:    Dessa Phi, RN Phone Number: 10/19/2019, 12:34 PM  Clinical Narrative: PT recc SNF-patient/dtr in law-[name]  agree to SNF;faxed out-prefer [facility]-await bed offers.has received covid vaccine. VM left for [employer] rep to initiate auth for SNF-await response.                 Expected Discharge Plan: Skilled Nursing Facility Barriers to Discharge: Continued Medical Work up   Patient Goals and CMS Choice Patient states their goals for this hospitalization and ongoing recovery are:: go to rehab CMS Medicare.gov Compare Post Acute Care list provided to:: Patient Choice offered to / list presented to : Patient  Expected Discharge Plan and Services Expected Discharge Plan: [place]   Discharge Planning Services: CM Consult   Living arrangements for the past 2 months: Single Family Home                                      Prior Living Arrangements/Services Living arrangements for the past 2 months: Single Family Home Lives with:: Self Patient language and need for interpreter reviewed:: Yes Do you feel safe going back to the place where you live?: Yes      Need for Family Participation in Patient Care: No (Comment) Care giver support system in place?: Yes (comment)   Criminal Activity/Legal Involvement Pertinent to Current Situation/Hospitalization: No - Comment as needed  Activities of Daily Living Home Assistive Devices/Equipment: Walker (specify type) ADL Screening (condition at time of admission) Patient's cognitive ability adequate to safely complete daily activities?: No Is the patient deaf or have difficulty hearing?: No Does the patient have difficulty seeing, even when wearing glasses/contacts?: No Does the patient have difficulty concentrating, remembering, or making  decisions?: No Patient able to express need for assistance with ADLs?: Yes Does the patient have difficulty dressing or bathing?: Yes Independently performs ADLs?: No Communication: Independent Dressing (OT): Needs assistance Does the patient have difficulty walking or climbing stairs?: No Weakness of Legs: Both Weakness of Arms/Hands: None  Permission Sought/Granted Permission sought to share information with : Case Manager Permission granted to share information with : Yes, Verbal Permission Granted  Share Information with NAME: Case manager           Emotional Assessment Appearance:: Appears stated age Attitude/Demeanor/Rapport: Gracious Affect (typically observed): Accepting Orientation: : Oriented to Self, Oriented to Place, Oriented to  Time, Oriented to Situation Alcohol / Substance Use: Not Applicable Psych Involvement: No (comment)  Admission diagnosis:  Dehydration [E86.0] Hyperkalemia [E87.5] Thrombocytopenia (HCC) [D69.6] Acute UTI [N39.0] AKI (acute kidney injury) ([place]) [N17.9] Multiple falls [R29.6] Traumatic rhabdomyolysis, initial encounter ([place]) [T79.6XXA] Cirrhosis of liver without ascites, unspecified hepatic cirrhosis type ([place]) [K74.60] Patient Active Problem List   Diagnosis Date Noted  . Hypovolemia 10/17/2019  . Acute lower UTI 10/17/2019  . Rhabdomyolysis 10/17/2019  . AKI (acute kidney injury) ([place]) 10/16/2019  . Hyperlipidemia associated with type 2 diabetes mellitus ([place]) 10/27/2018  . Generalized anxiety disorder 10/27/2018  . Open wound, lower leg, left, initial encounter 10/27/2018  . Tremor of right hand 10/27/2018  . 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 Alcoholic cirrhosis of liver with ascites ([place]) 08/02/2016  . Portal hypertensive gastropathy ([place]) 08/02/2016  . Iron deficiency anemia due  to chronic blood loss 01/19/2015  . Occult GI bleeding 01/19/2015  . Angiodysplasia of ascending colon 10/25/2014  . H/O bone  density study 04/27/2013  . Type 2 diabetes mellitus with hyperglycemia, with long-term current use of insulin ([place]) 03/24/2013  . Vaginal dryness, menopausal 01/20/2013  . Atrophic vaginitis 01/20/2013  . S/P hysterectomy 01/20/2013  . Cervical nerve root disorder 11/18/2012  . Shoulder weakness 11/18/2012  . Cancer of the skin, basal cell 09/03/2012  . Squamous cell skin cancer 09/03/2012  . Thrombocytopenia ([place]) 08/14/2012  . Lymphadenopathy of right cervical region 04/01/2012  . Hyperglycemia 09/19/2011  . Heel pain 08/06/2011  . Plantar fasciitis 08/06/2011  . Arthritis of knee, degenerative 08/06/2011  . Gonalgia 08/06/2011  . History of anemia 06/28/2011  . Anxiety 06/28/2011  . Essential hypertension, benign 06/28/2011  . GERD (gastroesophageal reflux disease) 06/28/2011  . Hyperlipidemia LDL goal <100 06/28/2011  . History of hysterectomy 06/28/2011  . Menopause 06/28/2011  . Hypothyroidism 06/28/2011  . Glaucoma 06/28/2011  . DJD (degenerative joint disease) 06/28/2011  . Essential hypertension 04/27/2010  . Acid reflux 10/28/2009  . Absolute anemia 05/24/2009  . Anemia, iron deficiency 05/24/2009  . Adult hypothyroidism 11/22/2008  . Diverticular disease of large intestine 05/15/2007  . Elevated fasting blood sugar 05/15/2007  . Psoriasis 05/15/2007  . Menopausal symptom 05/15/2007   PCP:  Ann Held, DO Pharmacy:   [facility] [place], [place] [address] [place] [address] Phone: [phone] Fax: [phone]     Social Determinants of Health (SDOH) Interventions    Readmission Risk Interventions No flowsheet data found.

## 2019-10-19 NOTE — NC FL2 (Signed)
[place] LEVEL OF CARE SCREENING TOOL     IDENTIFICATION  Patient Name: Jessica Dennis Birthdate: Aug 13, 1932 Sex: female Admission Date (Current Location): 10/16/2019  [facility] and [place] Number:  Herbalist and Address:  [facility],  [place] [address] [place]      Provider Number: [id number]  Attending Physician Name and Address:  Nita Sells, MD  Relative Name and Phone Number:  Dilara Navarrete son [phone]    Current Level of Care: Hospital Recommended Level of Care: [place] Prior Approval Number:    Date Approved/Denied:   PASRR Number: [id number]  Discharge Plan: SNF    Current Diagnoses: Patient Active Problem List   Diagnosis Date Noted  . Hypovolemia 10/17/2019  . Acute lower UTI 10/17/2019  . Rhabdomyolysis 10/17/2019  . AKI (acute kidney injury) ([place]) 10/16/2019  . Hyperlipidemia associated with type 2 diabetes mellitus ([place]) 10/27/2018  . Generalized anxiety disorder 10/27/2018  . Open wound, lower leg, left, initial encounter 10/27/2018  . Tremor of right hand 10/27/2018  . 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 Alcoholic cirrhosis of liver with ascites ([place]) 08/02/2016  . Portal hypertensive gastropathy ([place]) 08/02/2016  . Iron deficiency anemia due to chronic blood loss 01/19/2015  . Occult GI bleeding 01/19/2015  . Angiodysplasia of ascending colon 10/25/2014  . H/O bone density study 04/27/2013  . Type 2 diabetes mellitus with hyperglycemia, with long-term current use of insulin ([place]) 03/24/2013  . Vaginal dryness, menopausal 01/20/2013  . Atrophic vaginitis 01/20/2013  . S/P hysterectomy 01/20/2013  . Cervical nerve root disorder 11/18/2012  . Shoulder weakness 11/18/2012  . Cancer of the skin, basal cell 09/03/2012  . Squamous cell skin cancer 09/03/2012  . Thrombocytopenia ([place]) 08/14/2012  . Lymphadenopathy of right cervical region 04/01/2012  .  Hyperglycemia 09/19/2011  . Heel pain 08/06/2011  . Plantar fasciitis 08/06/2011  . Arthritis of knee, degenerative 08/06/2011  . Gonalgia 08/06/2011  . History of anemia 06/28/2011  . Anxiety 06/28/2011  . Essential hypertension, benign 06/28/2011  . GERD (gastroesophageal reflux disease) 06/28/2011  . Hyperlipidemia LDL goal <100 06/28/2011  . History of hysterectomy 06/28/2011  . Menopause 06/28/2011  . Hypothyroidism 06/28/2011  . Glaucoma 06/28/2011  . DJD (degenerative joint disease) 06/28/2011  . Essential hypertension 04/27/2010  . Acid reflux 10/28/2009  . Absolute anemia 05/24/2009  . Anemia, iron deficiency 05/24/2009  . Adult hypothyroidism 11/22/2008  . Diverticular disease of large intestine 05/15/2007  . Elevated fasting blood sugar 05/15/2007  . Psoriasis 05/15/2007  . Menopausal symptom 05/15/2007    Orientation RESPIRATION BLADDER Height & Weight     Self, Time, Situation, Place  Normal Continent Weight:   Height:     BEHAVIORAL SYMPTOMS/MOOD NEUROLOGICAL BOWEL NUTRITION STATUS      Continent Diet (Heart Healthy)  AMBULATORY STATUS COMMUNICATION OF NEEDS Skin   Limited Assist Verbally Normal                       Personal Care Assistance Level of Assistance  Bathing, Dressing, Feeding Bathing Assistance: Limited assistance Feeding assistance: Limited assistance Dressing Assistance: Limited assistance     Functional Limitations Info  Sight, Hearing, Speech Sight Info: Impaired (reading glasses) Hearing Info: Adequate Speech Info: Adequate    SPECIAL CARE FACTORS FREQUENCY  PT (By licensed PT), OT (By licensed OT)     PT Frequency: 5x week OT Frequency: 5x week  Contractures Contractures Info: Not present    Additional Factors Info  Code Status, Allergies Code Status Info: Full code Allergies Info: Tizandine           Current Medications (10/19/2019):  This is the current hospital active medication list Current  Facility-Administered Medications  Medication Dose Route Frequency Provider Last Rate Last Admin  . ascorbic acid (VITAMIN C) tablet 1,000 mg  1,000 mg Oral Daily Swayze, Ava, DO   1,000 mg at 10/18/19 1124  . folic acid (FOLVITE) tablet 1 mg  1 mg Oral Daily Swayze, Ava, DO   1 mg at 10/18/19 1124  . gabapentin (NEURONTIN) capsule 100 mg  100 mg Oral BID Swayze, Ava, DO   100 mg at 10/18/19 2147  . heparin injection 5,000 Units  5,000 Units Subcutaneous Q8H Swayze, Ava, DO   5,000 Units at 10/19/19 0601  . HYDROcodone-acetaminophen (NORCO/VICODIN) 5-325 MG per tablet 1-2 tablet  1-2 tablet Oral Q4H PRN Swayze, Ava, DO   2 tablet at 10/17/19 2019  . insulin aspart protamine- aspart (NOVOLOG MIX 70/30) injection 25 Units  25 Units Subcutaneous Q supper Swayze, Ava, DO   25 Units at 10/18/19 1602  . insulin aspart protamine- aspart (NOVOLOG MIX 70/30) injection 35 Units  35 Units Subcutaneous Q breakfast Swayze, Ava, DO   35 Units at 10/19/19 0842  . latanoprost (XALATAN) 0.005 % ophthalmic solution 1 drop  1 drop Both Eyes QHS Swayze, Ava, DO   1 drop at 10/18/19 2147  . levothyroxine (SYNTHROID) tablet 75 mcg  75 mcg Oral Daily Swayze, Ava, DO   75 mcg at 10/19/19 0602  . lip balm (CARMEX) ointment           . lip balm (CARMEX) ointment   Topical PRN Nita Sells, MD      . metoprolol succinate (TOPROL-XL) 24 hr tablet 12.5 mg  12.5 mg Oral Daily Nita Sells, MD   12.5 mg at 10/18/19 1233  . multivitamin with minerals tablet 1 tablet  1 tablet Oral Daily Swayze, Ava, DO   1 tablet at 10/18/19 1124  . ondansetron (ZOFRAN) tablet 4 mg  4 mg Oral Q6H PRN Swayze, Ava, DO       Or  . ondansetron (ZOFRAN) injection 4 mg  4 mg Intravenous Q6H PRN Swayze, Ava, DO      . pantoprazole (PROTONIX) EC tablet 40 mg  40 mg Oral Daily Swayze, Ava, DO   40 mg at 10/18/19 1124  . thiamine tablet 100 mg  100 mg Oral Daily Swayze, Ava, DO   100 mg at 10/18/19 1125  . traMADol (ULTRAM) tablet 50 mg   50 mg Oral Q8H PRN Swayze, Ava, DO   50 mg at 10/19/19 0150     Discharge Medications: Please see discharge summary for a list of discharge medications.  Relevant Imaging Results:  Relevant Lab Results:   Additional Information SS[id number], [place], [place]

## 2019-10-19 NOTE — Evaluation (Signed)
Occupational Therapy Evaluation Patient Details Name: Jessica Dennis MRN: [id number] DOB: 10-22-1932 Today's Date: 10/19/2019    History of Present Illness Pt admitted s/p fall at home and after spending 10 hrs on floor.  Pt with muliple contusions and with lacerations to R knee now sutured.  Pt with hx of DM , Cirrhosis, and bil THR   Clinical Impression   Pt admitted s/p fall. Pt currently with functional limitations due to the deficits listed below (see OT Problem List).  Pt will benefit from skilled OT to increase their safety and independence with ADL and functional mobility for ADL to facilitate discharge to venue listed below.   Pt agreeable to SNF and aware she will need rehab to regain I     Follow Up Recommendations  SNF          Precautions / Restrictions Precautions Precautions: Fall      Mobility Bed Mobility Overal bed mobility: Needs Assistance Bed Mobility: Sit to Supine       Sit to supine: Mod assist;+2 for safety/equipment      Transfers Overall transfer level: Needs assistance Equipment used: Rolling walker (2 wheeled) Transfers: Sit to/from Stand Sit to Stand: Mod assist;From elevated surface;+2 safety/equipment              Balance Overall balance assessment: Needs assistance Sitting-balance support: No upper extremity supported;Feet supported Sitting balance-Leahy Scale: Fair     Standing balance support: Bilateral upper extremity supported Standing balance-Leahy Scale: Poor                             ADL either performed or assessed with clinical judgement   ADL Overall ADL's : Needs assistance/impaired Eating/Feeding: Set up;Sitting   Grooming: Wash/dry face;Brushing hair;Sitting;Set up   Upper Body Bathing: Set up;Sitting   Lower Body Bathing: Maximal assistance;Sit to/from stand;Cueing for sequencing;Cueing for safety;+2 for safety/equipment   Upper Body Dressing : Minimal assistance;Sitting   Lower Body  Dressing: +2 for safety/equipment;Maximal assistance;Sit to/from stand;Cueing for sequencing;Cueing for safety   Toilet Transfer: +2 for safety/equipment;Maximal assistance;Stand-pivot;Cueing for sequencing;Cueing for safety   Toileting- Clothing Manipulation and Hygiene: Maximal assistance;+2 for safety/equipment;Cueing for safety;+2 for physical assistance;Sit to/from stand               Vision Patient Visual Report: No change from baseline              Pertinent Vitals/Pain Faces Pain Scale: Hurts little more Pain Location: all over Pain Descriptors / Indicators: Aching;Grimacing;Sore Pain Intervention(s): Limited activity within patient's tolerance;Monitored during session     Hand Dominance Right   Extremity/Trunk Assessment Upper Extremity Assessment Upper Extremity Assessment: Generalized weakness           Communication Communication Communication: No difficulties   Cognition Arousal/Alertness: Awake/alert Behavior During Therapy: WFL for tasks assessed/performed Overall Cognitive Status: Within Functional Limits for tasks assessed                                 General Comments: delayed response at times but appropriate   General Comments               Home Living Family/patient expects to be discharged to:: Skilled nursing facility Living Arrangements: Alone Available Help at Discharge: Other (Comment) (pt is vague on help available) Type of Home: House Home Access: Stairs to enter [employer] of Steps: 1+1  Home Layout: One level     Bathroom Shower/Tub: Teacher, early years/pre: Standard     Home Equipment: Environmental consultant - 2 wheels;Cane - single point;Bedside commode          Prior Functioning/Environment Level of Independence: Independent                 OT Problem List: Decreased strength;Decreased activity tolerance;Impaired balance (sitting and/or standing);Decreased safety  awareness;Decreased knowledge of use of DME or AE;Pain      OT Treatment/Interventions: Patient/family education;Therapeutic activities;Self-care/ADL training;DME and/or AE instruction    OT Goals(Current goals can be found in the care plan section) Acute Rehab OT Goals Patient Stated Goal: Regain IND OT Goal Formulation: With patient Time For Goal Achievement: 11/03/19 Potential to Achieve Goals: Good  OT Frequency: Min 2X/week   Barriers to D/C: Decreased caregiver support             AM-PAC OT "6 Clicks" Daily Activity     Outcome Measure Help from another person eating meals?: A Little Help from another person taking care of personal grooming?: A Little Help from another person toileting, which includes using toliet, bedpan, or urinal?: A Lot Help from another person bathing (including washing, rinsing, drying)?: A Lot Help from another person to put on and taking off regular upper body clothing?: A Little Help from another person to put on and taking off regular lower body clothing?: A Lot 6 Click Score: 15   End of Session Equipment Utilized During Treatment: Rolling walker;Gait belt  Activity Tolerance: Patient limited by fatigue Patient left: in bed;with call bell/phone within reach  OT Visit Diagnosis: Unsteadiness on feet (R26.81);Repeated falls (R29.6);Muscle weakness (generalized) (M62.81);Other abnormalities of gait and mobility (R26.89);History of falling (Z91.81)                Time: 4098-1191 OT Time Calculation (min): 11 min Charges:  OT General Charges $OT Visit: 1 Visit OT Evaluation $OT Eval Low Complexity: 1 Low  Kari Baars, OT Acute Rehabilitation Services [phone] Office- [phone]     [place], Edwena Felty D 10/19/2019, 2:05 PM

## 2019-10-19 NOTE — Plan of Care (Signed)

## 2019-10-19 NOTE — Progress Notes (Addendum)
PROGRESS NOTE    Jessica Dennis  TGG:[id number] DOB: 1932-08-17 DOA: 10/16/2019 PCP: Ann Held, DO  Brief Narrative:   86 year old white lady Known history chronic TCP + cirrhosis + splenomegaly followed by Dr. Ivor Reining history of esophageal varices followed by Dr. Arelia Longest Essential tremor under work-up-suspicious nevus of skin, HTN, HLD, hypothyroid Increasing frequency of falls recently-fell on 10/16/2019 and remained on the floor for 10 hours 3 lacerations and right knee swollen ankl culture pendinge Work-up revealed sinus tach hypertension 150s Potassium 5.4 creatinine 1.3 above baseline 1.02 gap 15 CK 1431 White count 12.5 UA?  Urine cult neg  Assessment & Plan:   Active Problems:   GERD (gastroesophageal reflux disease)   Hyperlipidemia LDL goal <100   Thrombocytopenia (HCC)   Essential hypertension   Adult hypothyroidism   Alcoholic cirrhosis of liver with ascites (HCC)   GAVE (gastric antral vascular ectasia)   Hyperlipidemia associated with type 2 diabetes mellitus (HCC)   Generalized anxiety disorder   AKI (acute kidney injury) ([place])   Hypovolemia   Acute lower UTI   Rhabdomyolysis  1. Accidental fall a. Orthostatics are negative b. Lasix 20 quinapril 40 Aldactone 100 held c. Keep on telemetry has sinus tach today 2. Sinus tachycardia? Afib--IF Afib [place] >4 a. Felt initially secondary to volume depletion  b. Start low-dose metoprolol XL 12.5 daily and monitor c. She is at high risk for bleed given TCP and falls--not good AC candidate 3.  asymptomatic bacteriuria a. No dysuria no burning no pressure b. Urine was collected in a cup-abx d/c 4. Alcoholic cirrhosis with thrombocytopenia which is chronic with gave and reflux a. Protonix 40 daily b. Outpatient follow-up with gastroenterology no concerns for bleeding at this time 5. General anxiety disorder a. Continue lorazepam 0.5 as needed if orthostatics are negative 6. Mild hyperkalemia AKI and  rhabdomyolysis on admission a. Saline lock 9/27 b. Labs improved 7. HTN a. Holding lisinopril etc. as above at this time check orthostatics 8. Hypothyroid a. Continue Synthroid 75 orally daily monitor as an outpatient 9. Diabetes mellitus with some underlying nephropathy a. Continue at this time 70/30 insulin 25 units p.m., 35 units a.m, sugars are controlled 110-189 b. Resume Metformin at low dose 500 XR [might not have been [place] pta] c. Neuropathy continue gabapentin 100 twice daily 10. Prior squamous cell CA skin 11. Essential tremor  DVT prophylaxis: Lovenox Code Status: Full Family Communication: called but no answer today Son Gershon Mussel [phone] Disposition:   Status is: ipt  The patient remains OBS appropriate and will d/c before 2 midnights.  Dispo: The patient is from: Home              Anticipated d/c is to: Home              Anticipated d/c date is: 1 day              Patient currently is not medically stable to d/c.   Likely can d/c to SNF am once worked up    Consultants:   None yet  Procedures: None  Antimicrobials: Discontinued ceftriaxone   Subjective:  Coherent Confusion has cleared Asking when she can leave  No cp no fever Sinus tach  Objective: Vitals:   10/18/19 2101 10/19/19 0558 10/19/19 1258 10/19/19 1308  BP: 124/67 119/69 112/62 113/71  Pulse: 98 (!) 105 70 84  Resp: 16 20 18    Temp: 98.4 F (36.9 C) 98.2 F (36.8 C) 98 F (36.7 C)  TempSrc: Oral Oral Oral   SpO2: 96% 93% 93%     Intake/Output Summary (Last 24 hours) at 10/19/2019 1328 Last data filed at 10/19/2019 0646 Gross per 24 hour  Intake 2717.75 ml  Output 500 ml  Net 2217.75 ml   There were no vitals filed for this visit.  Examination:  Awake coherent EOMI NCAT no focal deficits mild symmetric Moving all 4 limbs equally Chest clear no added sound no rales no rhonchi Abdomen soft  Data Reviewed: I have personally reviewed following labs and imaging  studies Sodium 135 BUN/creatinine down from 30/1.3-23/0.9--->18/0.88 Hemoglobin 9.3-->9.4 Platelet 44-->49 -->61  Radiology Studies: No results found.   Scheduled Meds: . vitamin C  1,000 mg Oral Daily  . folic acid  1 mg Oral Daily  . gabapentin  100 mg Oral BID  . heparin  5,000 Units Subcutaneous Q8H  . insulin aspart protamine- aspart  25 Units Subcutaneous Q supper  . insulin aspart protamine- aspart  35 Units Subcutaneous Q breakfast  . latanoprost  1 drop Both Eyes QHS  . levothyroxine  75 mcg Oral Daily  . lip balm      . metFORMIN  500 mg Oral Daily  . metoprolol succinate  12.5 mg Oral Daily  . multivitamin with minerals  1 tablet Oral Daily  . pantoprazole  40 mg Oral Daily  . thiamine  100 mg Oral Daily   Continuous Infusions:    LOS: 2 days    Time spent: 20  Nita Sells, MD [facility] To contact the attending provider between 7A-7P or the covering provider during after hours 7P-7A, please log into the web site [email or web address] and access using universal [place] password for that web site. If you do not have the password, please call the hospital operator.  10/19/2019, 1:28 PM

## 2019-10-20 DIAGNOSIS — R296 Repeated falls: Secondary | ICD-10-CM

## 2019-10-20 LAB — CBC WITH DIFFERENTIAL/PLATELET
Abs Immature Granulocytes: 0.02 10*3/uL (ref 0.00–0.07)
Basophils Absolute: 0 10*3/uL (ref 0.0–0.1)
Basophils Relative: 1 %
Eosinophils Absolute: 0.2 10*3/uL (ref 0.0–0.5)
Eosinophils Relative: 3 %
HCT: 30.5 % — ABNORMAL LOW (ref 36.0–46.0)
Hemoglobin: 9.4 g/dL — ABNORMAL LOW (ref 12.0–15.0)
Immature Granulocytes: 0 %
Lymphocytes Relative: 19 %
Lymphs Abs: 1.1 10*3/uL (ref 0.7–4.0)
MCH: 27.5 pg (ref 26.0–34.0)
MCHC: 30.8 g/dL (ref 30.0–36.0)
MCV: 89.2 fL (ref 80.0–100.0)
Monocytes Absolute: 0.5 10*3/uL (ref 0.1–1.0)
Monocytes Relative: 9 %
Neutro Abs: 3.6 10*3/uL (ref 1.7–7.7)
Neutrophils Relative %: 68 %
Platelets: 68 10*3/uL — ABNORMAL LOW (ref 150–400)
RBC: 3.42 MIL/uL — ABNORMAL LOW (ref 3.87–5.11)
RDW: 14.6 % (ref 11.5–15.5)
WBC: 5.4 10*3/uL (ref 4.0–10.5)
nRBC: 0 % (ref 0.0–0.2)

## 2019-10-20 LAB — COMPREHENSIVE METABOLIC PANEL
ALT: 25 U/L (ref 0–44)
AST: 48 U/L — ABNORMAL HIGH (ref 15–41)
Albumin: 2.7 g/dL — ABNORMAL LOW (ref 3.5–5.0)
Alkaline Phosphatase: 74 U/L (ref 38–126)
Anion gap: 9 (ref 5–15)
BUN: 15 mg/dL (ref 8–23)
CO2: 25 mmol/L (ref 22–32)
Calcium: 8.6 mg/dL — ABNORMAL LOW (ref 8.9–10.3)
Chloride: 103 mmol/L (ref 98–111)
Creatinine, Ser: 0.88 mg/dL (ref 0.44–1.00)
GFR calc Af Amer: >60 mL/min (ref >60)
GFR calc non Af Amer: >60 mL/min (ref >60)
Glucose, Bld: 93 mg/dL (ref 70–99)
Potassium: 3.7 mmol/L (ref 3.5–5.1)
Sodium: 137 mmol/L (ref 135–145)
Total Bilirubin: 1.8 mg/dL — ABNORMAL HIGH (ref 0.3–1.2)
Total Protein: 5.3 g/dL — ABNORMAL LOW (ref 6.5–8.1)

## 2019-10-20 LAB — SARS CORONAVIRUS 2 BY RT PCR (HOSPITAL ORDER, PERFORMED IN ~~LOC~~ HOSPITAL LAB): SARS Coronavirus 2: NEGATIVE

## 2019-10-20 MED ORDER — GABAPENTIN 100 MG PO CAPS: 100.0000 mg | ORAL_CAPSULE | Freq: Two times a day (BID) | ORAL | 0 refills | Status: DC

## 2019-10-20 MED ORDER — METOPROLOL SUCCINATE ER 25 MG PO TB24: 12.5000 mg | ORAL_TABLET | Freq: Every day | ORAL | Status: DC

## 2019-10-20 NOTE — Progress Notes (Signed)
Called [facility] for report spoke to Nurse [facility] LPN

## 2019-10-20 NOTE — TOC Progression Note (Signed)
Transition of Care [facility]) - Progression Note    Patient Details  Name: Jessica Dennis MRN: [id number] Date of Birth: 1932/04/03  [facility] CM/SW Contact  Makaiyah Schweiger, [place], RN Phone Number: 10/20/2019, 11:21 AM  Clinical Narrative:   [facility] accepted-waiting on covid results-prior getting rm#, family will transport on own.    Expected Discharge Plan: Skilled Nursing Facility Barriers to Discharge: Other (comment)  Expected Discharge Plan and Services Expected Discharge Plan: [place]   Discharge Planning Services: CM Consult   Living arrangements for the past 2 months: Single Family Home Expected Discharge Date: 10/20/19                                     Social Determinants of Health (SDOH) Interventions    Readmission Risk Interventions No flowsheet data found.

## 2019-10-20 NOTE — TOC Progression Note (Signed)
Transition of Care [facility]) - Progression Note    Patient Details  Name: Jessica Dennis MRN: [id number] Date of Birth: 02-21-1932  [facility] CM/SW Contact  Esma Kilts, Juliann Pulse, RN Phone Number: 10/20/2019, 10:26 AM  Clinical Narrative: Insurance auth received Josem Kaufmann [id number] from [facility]-dtr has bed offers but wants [facility]-they are checking if they can offer a bed.      Expected Discharge Plan: Skilled Nursing Facility Barriers to Discharge: Other (comment)  Expected Discharge Plan and Services Expected Discharge Plan: [place]   Discharge Planning Services: CM Consult   Living arrangements for the past 2 months: Single Family Home Expected Discharge Date: 10/20/19                                     Social Determinants of Health (SDOH) Interventions    Readmission Risk Interventions No flowsheet data found.

## 2019-10-20 NOTE — TOC Transition Note (Signed)
Transition of Care [facility]) - CM/SW Discharge Note   Patient Details  Name: Jessica Dennis MRN: [id number] Date of Birth: 07/31/1932  Transition of [facility]) CM/SW Contact:  Dessa Phi, RN Phone Number: 10/20/2019, 1:01 PM   Clinical Narrative: covid results in-going to [id number],nsg call report to [phone] [facility]. Family to transport on own.No further CM needs.       Final next level of care: Skilled Nursing Facility Barriers to Discharge: No Barriers Identified   Patient Goals and CMS Choice Patient states their goals for this hospitalization and ongoing recovery are:: go to rehab CMS Medicare.gov Compare Post Acute Care list provided to:: Patient Choice offered to / list presented to : Patient  Discharge Placement              Patient chooses bed at: [facility] Patient to be transferred to facility by: family Name of family member notified: [name] dtr in law Patient and family notified of of transfer: 10/20/19  Discharge Plan and Services   Discharge Planning Services: CM Consult                                 Social Determinants of Health (SDOH) Interventions     Readmission Risk Interventions No flowsheet data found.

## 2019-10-20 NOTE — Discharge Summary (Signed)
Physician Discharge Summary  Jessica Dennis YQI:[id number] DOB: 06-02-1932 DOA: 10/16/2019  PCP: Ann Held, DO  Admit date: 10/16/2019 Discharge date: 10/20/2019  Time spent: 35 minutes  Recommendations for Outpatient Follow-up:  1. Needs outpatient CBC Chem-12 1 week 2. Needs outpatient TSH in 3 weeks 3. Discontinued multiple meds such as diuretics blood pressure meds that may have contributed to AKI 4. May have underlying A. fib?  Does have sinus tach but on exam subsequently did not seem to have any A. fib on monitors---May benefit from event monitor and EKG in several days in the outpatient setting just to see if it is present-outpatient further work-up can be done  Discharge Diagnoses:  Active Problems:   GERD (gastroesophageal reflux disease)   Hyperlipidemia LDL goal <100   Thrombocytopenia (HCC)   Essential hypertension   Adult hypothyroidism   Alcoholic cirrhosis of liver with ascites (HCC)   GAVE (gastric antral vascular ectasia)   Hyperlipidemia associated with type 2 diabetes mellitus (HCC)   Generalized anxiety disorder   AKI (acute kidney injury) ([place])   Hypovolemia   Acute lower UTI   Rhabdomyolysis   Discharge Condition: Improved  Diet recommendation: Heart healthy  There were no vitals filed for this visit.  History of present illness:  86 year old white lady Known history chronic TCP + cirrhosis + splenomegaly followed by Dr. Ivor Reining history of esophageal varices followed by Dr. Arelia Longest Essential tremor under work-up-suspicious nevus of skin, HTN, HLD, hypothyroid Increasing frequency of falls recently-fell on 10/16/2019 and remained on the floor for 10 hours 3 lacerations and right knee swollen ankl culture pendinge Work-up revealed sinus tach hypertension 150s Potassium 5.4 creatinine 1.3 above baseline 1.02 gap 15 CK 1431 White count 12.5 UA?  Urine cult neg   Hospital Course:  1. Accidental fall a. Orthostatics are  negative b. Lasix 20 quinapril 40 Aldactone 100 held on discharge and will need reimplementation if felt needed 2. Sinus tachycardia? Afib--IF Afib [place] >4 a. Felt initially secondary to volume depletion  b. Start low-dose metoprolol XL 12.5 daily and monitor c. She is at high risk for bleed given TCP and falls--not good AC candidate d. This can be reevaluated in the outpatient setting 3.  asymptomatic bacteriuria a. No dysuria no burning no pressure b. Urine was collected in a cup-abx d/c 4. Alcoholic cirrhosis with thrombocytopenia which is chronic with gave and reflux a. Protonix 40 daily b. Outpatient follow-up with gastroenterology no concerns for bleeding at this time 5. General anxiety disorder a. Lorazepam was discontinued this admission 6. Mild hyperkalemia AKI and rhabdomyolysis on admission a. Saline lock 9/27 b. Labs improved 7. HTN a. Holding lisinopril etc. as above at this time check orthostatics 8. Hypothyroid a. Continue Synthroid 75 orally daily monitor as an outpatient 9. Diabetes mellitus with some underlying nephropathy a. Continue at this time 70/30 insulin 25 units p.m., 35 units a.m, sugars were well-controlled during hospital stay b. Resume Metformin at low dose 500 XR [might not have been [place] pta] c. Neuropathy continue gabapentin 100 twice daily 10. Prior squamous cell CA skin 11. Essential tremor Consultations:  None  Discharge Exam: Vitals:   10/19/19 2051 10/20/19 0439  BP: (!) 141/75 (!) 114/52  Pulse: 82 69  Resp: 18 18  Temp: 98.3 F (36.8 C) 97.7 F (36.5 C)  SpO2: 96% 95%    General: Awake alert coherent no distress EOMI NCAT no focal deficit Cardiovascular: S1-S2 no murmur rub or gallop Respiratory: Clear no rales no  rhonchi Abdomen soft no rebound no guarding Neurologically intact no focal deficit able to sit up Wounds look fairly well on the face  Discharge Instructions   Discharge Instructions    Diet - low sodium heart  healthy   Complete by: As directed    Increase activity slowly   Complete by: As directed    No wound care   Complete by: As directed      Allergies as of 10/20/2019      Reactions   Tizanidine    Hallucinate, confused       Medication List    STOP taking these medications   Calcium Citrate 250 MG Tabs   Fish Oil 1200 MG Cpdr   furosemide 20 MG tablet Commonly known as: LASIX   Klor-Con M20 20 MEQ tablet Generic drug: potassium chloride SA   LEG CRAMPS PO   LORazepam 0.5 MG tablet Commonly known as: ATIVAN   meloxicam 7.5 MG tablet Commonly known as: MOBIC   quinapril 40 MG tablet Commonly known as: ACCUPRIL   spironolactone 50 MG tablet Commonly known as: ALDACTONE   tiZANidine 4 MG tablet Commonly known as: ZANAFLEX     TAKE these medications   Accu-Chek FastClix Lancets Misc CHECK FOR BLOOD SUGAR DAILY   alclomethasone 0.05 % cream Commonly known as: ACLOVATE Apply 1 application topically daily as needed.   ammonium lactate 12 % cream Commonly known as: AMLACTIN Apply topically daily as needed.   CALCIUM CARBONATE-VITAMIN D PO Take by mouth. Calcium 1200mg   Vitamin D 5000 units  Pt also has a calcium 600mg  plus 800 units vitamin D supplement   gabapentin 100 MG capsule Commonly known as: NEURONTIN Take 1 capsule (100 mg total) by mouth 2 (two) times daily.   gentamicin ointment 0.1 % Commonly known as: GARAMYCIN Apply 1 application topically as needed.   latanoprost 0.005 % ophthalmic solution Commonly known as: XALATAN Place 1 drop into both eyes at bedtime.   levothyroxine 75 MCG tablet Commonly known as: SYNTHROID TAKE ONE TABLET BY MOUTH DAILY   metFORMIN 500 MG 24 hr tablet Commonly known as: GLUCOPHAGE-XR Take 500 mg by mouth daily.   metoprolol succinate 25 MG 24 hr tablet Commonly known as: TOPROL-XL Take 0.5 tablets (12.5 mg total) by mouth daily. Start taking on: October 21, 2019   NOVOLOG MIX 70/30 [place] Inject into  the skin. 35 units in the mornings and 25 at night   pantoprazole 40 MG tablet Commonly known as: PROTONIX Take 1 tablet (40 mg total) by mouth daily.   Prednicarbate 0.1 % Crea Apply topically 2 (two) times daily.   PREVAGEN PO Take 1 tablet by mouth daily.   simvastatin 20 MG tablet Commonly known as: ZOCOR TAKE ONE TABLET BY MOUTH EVERY NIGHT AT BEDTIME   SSD 1 % cream Generic drug: silver sulfADIAZINE 1 application daily.   vitamin C 1000 MG tablet Take 1,000 mg by mouth daily.   vitamin E 180 MG (400 UNITS) capsule Take by mouth daily.      Allergies  Allergen Reactions  . Tizanidine     Hallucinate, confused       The results of significant diagnostics from this hospitalization (including imaging, microbiology, ancillary and laboratory) are listed below for reference.    Significant Diagnostic Studies: CT HEAD WO CONTRAST  Result Date: 10/16/2019 CLINICAL DATA:  Fall EXAM: CT HEAD WITHOUT CONTRAST CT CERVICAL SPINE WITHOUT CONTRAST TECHNIQUE: Multidetector CT imaging of the head and cervical spine was performed  following the standard protocol without intravenous contrast. Multiplanar CT image reconstructions of the cervical spine were also generated. COMPARISON:  None. FINDINGS: CT HEAD FINDINGS Brain: No evidence of acute infarction, hemorrhage, hydrocephalus, extra-axial collection, visible mass lesion or mass effect. Symmetric prominence of the ventricles, cisterns and sulci compatible with parenchymal volume loss. Patchy areas of white matter hypoattenuation are most compatible with chronic microvascular angiopathy. Vascular: 8 mm rounded intermediate attenuation (42 HU) structure along the anterior interventricular septum in the vicinity of the anterior communicating artery/A1 segment on the right, concerning for potential aneurysm. Calcifications of the carotid siphons and intradural vertebral arteries are noted as well. No concerning hyperdense vessel. Skull: No  calvarial fracture or suspicious osseous lesion. No scalp swelling or hematoma. Small mineralization along the superolateral aspect of the left orbit, could reflect a benign dermal calcification. Sinuses/Orbits: Paranasal sinuses and mastoid air cells are predominantly clear. Likely remote deformity of the nasal bones in the absence of overlying swelling. Could correlate for point tenderness. Included orbital structures are unremarkable. Other: None CT CERVICAL SPINE FINDINGS Alignment: Stabilization collar is absent at the time of examination. There is straightening and slight reversal the normal cervical lordosis which is centered at the C5 level. 3 mm anterolisthesis C3 on C4. Stepwise 1-2 mm retrolisthesis C5-C7. 4 mm anterolisthesis C7 on T1. Spondylolisthesis is favored to be on a degenerative basis given associated endplate changes and facet degenerative features at these levels. No evidence of traumatic listhesis. No abnormally widened, perched or jumped facets. Normal alignment of the craniocervical and atlantoaxial articulations accounting for rightward cranial rotation. Skull base and vertebrae: No acute skull base fracture. No vertebral body fracture or height loss. Normal bone mineralization. No worrisome osseous lesions. Multilevel Schmorl's node formations are present. Moderate arthrosis at the atlantodental and basion dens intervals. Spondylitic changes of the spine detailed further above and below. Soft tissues and spinal canal: No pre or paravertebral fluid or swelling. No visible canal hematoma. Disc levels: Multilevel intervertebral disc height loss with spondylitic endplate changes. Features most pronounced C5-6 where a slightly larger disc osteophyte complex results in some mild to moderate canal stenosis. Additional mild narrowings noted C4-5, and C6-T1. Multilevel uncinate spurring and facet hypertrophic changes are present throughout the cervical spine as well resulting in some mild  multilevel neural foraminal narrowing with more moderate narrowing bilaterally C5-6. Upper chest: No acute abnormality in the upper chest or imaged lung apices. Other: Cervical carotid atherosclerosis. No concerning thyroid nodules. IMPRESSION: 1. No acute intracranial abnormality. No scalp swelling or calvarial fracture. 2. Likely remote deformity of the nasal bones in the absence of overlying swelling. Could correlate for point tenderness. 3. No evidence of acute fracture or traumatic listhesis of the cervical spine. 4. Spondylitic changes of the cervical spine, as detailed above. Maximal changes resulting in mild-to-moderate canal stenosis and foraminal narrowing C5-6. 5. Cervical and intracranial atherosclerosis. 6. 8 mm rounded intermediate attenuation structure along the anterior interventricular septum in the vicinity of the anterior communicating artery/A1 segment on the right, concerning for potential aneurysm. Consider further assessment with outpatient MRI/MRA. Electronically Signed   By: Lovena Le M.D.   On: 10/16/2019 21:01   CT CERVICAL SPINE WO CONTRAST  Result Date: 10/16/2019 CLINICAL DATA:  Fall EXAM: CT HEAD WITHOUT CONTRAST CT CERVICAL SPINE WITHOUT CONTRAST TECHNIQUE: Multidetector CT imaging of the head and cervical spine was performed following the standard protocol without intravenous contrast. Multiplanar CT image reconstructions of the cervical spine were also generated. COMPARISON:  None.  FINDINGS: CT HEAD FINDINGS Brain: No evidence of acute infarction, hemorrhage, hydrocephalus, extra-axial collection, visible mass lesion or mass effect. Symmetric prominence of the ventricles, cisterns and sulci compatible with parenchymal volume loss. Patchy areas of white matter hypoattenuation are most compatible with chronic microvascular angiopathy. Vascular: 8 mm rounded intermediate attenuation (42 HU) structure along the anterior interventricular septum in the vicinity of the anterior  communicating artery/A1 segment on the right, concerning for potential aneurysm. Calcifications of the carotid siphons and intradural vertebral arteries are noted as well. No concerning hyperdense vessel. Skull: No calvarial fracture or suspicious osseous lesion. No scalp swelling or hematoma. Small mineralization along the superolateral aspect of the left orbit, could reflect a benign dermal calcification. Sinuses/Orbits: Paranasal sinuses and mastoid air cells are predominantly clear. Likely remote deformity of the nasal bones in the absence of overlying swelling. Could correlate for point tenderness. Included orbital structures are unremarkable. Other: None CT CERVICAL SPINE FINDINGS Alignment: Stabilization collar is absent at the time of examination. There is straightening and slight reversal the normal cervical lordosis which is centered at the C5 level. 3 mm anterolisthesis C3 on C4. Stepwise 1-2 mm retrolisthesis C5-C7. 4 mm anterolisthesis C7 on T1. Spondylolisthesis is favored to be on a degenerative basis given associated endplate changes and facet degenerative features at these levels. No evidence of traumatic listhesis. No abnormally widened, perched or jumped facets. Normal alignment of the craniocervical and atlantoaxial articulations accounting for rightward cranial rotation. Skull base and vertebrae: No acute skull base fracture. No vertebral body fracture or height loss. Normal bone mineralization. No worrisome osseous lesions. Multilevel Schmorl's node formations are present. Moderate arthrosis at the atlantodental and basion dens intervals. Spondylitic changes of the spine detailed further above and below. Soft tissues and spinal canal: No pre or paravertebral fluid or swelling. No visible canal hematoma. Disc levels: Multilevel intervertebral disc height loss with spondylitic endplate changes. Features most pronounced C5-6 where a slightly larger disc osteophyte complex results in some mild to  moderate canal stenosis. Additional mild narrowings noted C4-5, and C6-T1. Multilevel uncinate spurring and facet hypertrophic changes are present throughout the cervical spine as well resulting in some mild multilevel neural foraminal narrowing with more moderate narrowing bilaterally C5-6. Upper chest: No acute abnormality in the upper chest or imaged lung apices. Other: Cervical carotid atherosclerosis. No concerning thyroid nodules. IMPRESSION: 1. No acute intracranial abnormality. No scalp swelling or calvarial fracture. 2. Likely remote deformity of the nasal bones in the absence of overlying swelling. Could correlate for point tenderness. 3. No evidence of acute fracture or traumatic listhesis of the cervical spine. 4. Spondylitic changes of the cervical spine, as detailed above. Maximal changes resulting in mild-to-moderate canal stenosis and foraminal narrowing C5-6. 5. Cervical and intracranial atherosclerosis. 6. 8 mm rounded intermediate attenuation structure along the anterior interventricular septum in the vicinity of the anterior communicating artery/A1 segment on the right, concerning for potential aneurysm. Consider further assessment with outpatient MRI/MRA. Electronically Signed   By: Lovena Le M.D.   On: 10/16/2019 21:01   DG Chest Port 1 View  Result Date: 10/16/2019 CLINICAL DATA:  Fall, pain. EXAM: PORTABLE CHEST 1 VIEW COMPARISON:  Chest radiograph 10/02/2011 FINDINGS: The cardiomediastinal contours are normal. Aortic atherosclerosis. Pulmonary vasculature is normal. No consolidation, pleural effusion, or pneumothorax. No acute osseous abnormalities are seen. Mild chronic change about the right shoulder. IMPRESSION: No acute chest findings. Electronically Signed   By: Keith Rake M.D.   On: 10/16/2019 20:39   DG  Knee Complete 4 Views Right  Result Date: 10/16/2019 CLINICAL DATA:  Trip and fall this morning.  Right knee laceration. EXAM: RIGHT KNEE - COMPLETE 4+ VIEW  COMPARISON:  None. FINDINGS: No evidence of fracture or dislocation. Trace joint effusion. Mild tricompartmental peripheral spurring. Chondrocalcinosis in the lateral tibiofemoral compartment. There is generalized soft tissue edema. Small amount of soft tissue air in the anterior knee soft tissues. IMPRESSION: 1. No fracture or subluxation of the right knee. 2. Generalized soft tissue edema with small amount of soft tissue air in the anterior knee soft tissues consistent with laceration. 3. Chondrocalcinosis in the lateral tibiofemoral compartment. Mild osteoarthritis. Electronically Signed   By: Keith Rake M.D.   On: 10/16/2019 20:37    Microbiology: Recent Results (from the past 240 hour(s))  Urine Culture     Status: Abnormal   Collection Time: 10/16/19 10:15 PM   Specimen: Urine, Random  Result Value Ref Range Status   Specimen Description   Final    URINE, RANDOM Performed at [facility] [address] [place], [place] [address]    Special Requests   Final    NONE Performed at [facility], [place] [address], [place], [place] [address]    Culture >=100,000 COLONIES/mL ESCHERICHIA COLI (A)  Final   Report Status 10/19/2019 FINAL  Final   Organism ID, Bacteria ESCHERICHIA COLI (A)  Final      Susceptibility   Escherichia coli - MIC*    AMPICILLIN <=2 SENSITIVE Sensitive     CEFAZOLIN <=4 SENSITIVE Sensitive     CEFTRIAXONE <=0.25 SENSITIVE Sensitive     CIPROFLOXACIN <=0.25 SENSITIVE Sensitive     GENTAMICIN <=1 SENSITIVE Sensitive     IMIPENEM <=0.25 SENSITIVE Sensitive     NITROFURANTOIN <=16 SENSITIVE Sensitive     TRIMETH/SULFA <=20 SENSITIVE Sensitive     AMPICILLIN/SULBACTAM <=2 SENSITIVE Sensitive     PIP/TAZO <=4 SENSITIVE Sensitive     * >=100,000 COLONIES/mL ESCHERICHIA COLI  Respiratory Panel by RT PCR (Flu A&B, Covid) - Nasopharyngeal Swab     Status: None   Collection Time: 10/16/19 11:51 PM   Specimen: Nasopharyngeal Swab   Result Value Ref Range Status   SARS Coronavirus 2 by RT PCR NEGATIVE NEGATIVE Final    Comment: (NOTE) SARS-CoV-2 target nucleic acids are NOT DETECTED.  The SARS-CoV-2 RNA is generally detectable in upper respiratoy specimens during the acute phase of infection. The lowest concentration of SARS-CoV-2 viral copies this assay can detect is 131 copies/mL. A negative result does not preclude SARS-Cov-2 infection and should not be used as the sole basis for treatment or other patient management decisions. A negative result may occur with  improper specimen collection/handling, submission of specimen other than nasopharyngeal swab, presence of viral mutation(s) within the areas targeted by this assay, and inadequate number of viral copies (<131 copies/mL). A negative result must be combined with clinical observations, patient history, and epidemiological information. The expected result is Negative.  Fact Sheet for Patients:  PinkCheek.be  Fact Sheet for Healthcare Providers:  GravelBags.it  This test is no t yet approved or cleared by the [place] FDA and  has been authorized for detection and/or diagnosis of SARS-CoV-2 by FDA under an Emergency Use Authorization (EUA). This EUA will remain  in effect (meaning this test can be used) for the duration of the COVID-19 declaration under Section 564(b)(1) of the Act, 21 U.S.C. section 360bbb-3(b)(1), unless the authorization is terminated or revoked sooner.  Influenza A by PCR NEGATIVE NEGATIVE Final   Influenza B by PCR NEGATIVE NEGATIVE Final    Comment: (NOTE) The Xpert Xpress SARS-CoV-2/FLU/RSV assay is intended as an aid in  the diagnosis of influenza from Nasopharyngeal swab specimens and  should not be used as a sole basis for treatment. Nasal washings and  aspirates are unacceptable for Xpert Xpress SARS-CoV-2/FLU/RSV  testing.  Fact Sheet for  Patients: PinkCheek.be  Fact Sheet for Healthcare Providers: GravelBags.it  This test is not yet approved or cleared by the [place] FDA and  has been authorized for detection and/or diagnosis of SARS-CoV-2 by  FDA under an Emergency Use Authorization (EUA). This EUA will remain  in effect (meaning this test can be used) for the duration of the  Covid-19 declaration under Section 564(b)(1) of the Act, 21  U.S.C. section 360bbb-3(b)(1), unless the authorization is  terminated or revoked. Performed at [facility], [place] [address] [place], [place] [address]   Culture, blood (routine x 2)     Status: None (Preliminary result)   Collection Time: 10/17/19  1:03 AM   Specimen: BLOOD RIGHT HAND  Result Value Ref Range Status   Specimen Description   Final    BLOOD RIGHT HAND Performed at [facility] [address], [place], [place] [address]    Special Requests   Final    BOTTLES DRAWN AEROBIC AND ANAEROBIC Blood Culture adequate volume Performed at [facility] [address], [place], [place] [address]    Culture   Final    NO GROWTH 3 DAYS Performed at [facility], [place] [address], [place], [place] [address]    Report Status PENDING  Incomplete  Culture, blood (routine x 2)     Status: None (Preliminary result)   Collection Time: 10/17/19  1:03 AM   Specimen: BLOOD LEFT HAND  Result Value Ref Range Status   Specimen Description   Final    BLOOD LEFT HAND Performed at [facility] [address], [place], [place] [address]    Special Requests   Final    BOTTLES DRAWN AEROBIC ONLY Blood Culture adequate volume Performed at [facility] [address], [place], [place] [address]    Culture   Final    NO GROWTH 3 DAYS Performed at [facility], [place] [address], [place], [place] [address]    Report Status  PENDING  Incomplete     Labs: Basic Metabolic Panel: Recent Labs  Lab 10/16/19 1958 10/17/19 0103 10/18/19 0736 10/19/19 0527 10/20/19 0531  NA 136 136 133* 135 137  K 5.4* 4.6 4.3 4.2 3.7  CL 101 104 104 102 103  CO2 20* 20* 23 23 25   GLUCOSE 217* 254* 137* 110* 93  BUN 30* 29* 23 18 15   CREATININE 1.35* 1.15* 0.98 0.88 0.88  CALCIUM 10.1 8.9 8.6* 8.7* 8.6*   Liver Function Tests: Recent Labs  Lab 10/16/19 1958 10/17/19 0103 10/18/19 0736 10/19/19 0527 10/20/19 0531  AST 87* 71* 56* 52* 48*  ALT 34 28 26 26 25   ALKPHOS 96 88 76 71 74  BILITOT 2.9* 1.8* 1.9* 1.6* 1.8*  PROT 6.5 5.8* 5.5* 5.1* 5.3*  ALBUMIN 3.5 3.3* 2.9* 2.7* 2.7*   No results for input(s): LIPASE, AMYLASE in the last 168 hours. Recent Labs  Lab 10/16/19 1958  AMMONIA 46*   CBC: Recent Labs  Lab 10/16/19 1958 10/17/19 0103 10/18/19 3710 10/19/19 6269  10/20/19 0531  WBC 12.5* 9.1 5.2 6.5 5.4  NEUTROABS  --   --  3.9 4.9 3.6  HGB 9.9* 9.3* 8.9* 9.4* 9.4*  HCT 31.8* 30.5* 29.0* 30.6* 30.5*  MCV 87.1 89.7 89.8 89.2 89.2  PLT 69* 44* 49* 61* 68*   Cardiac Enzymes: Recent Labs  Lab 10/16/19 1958 10/18/19 0736  CKTOTAL 1,431* 208   BNP: BNP (last 3 results) No results for input(s): BNP in the last 8760 hours.  ProBNP (last 3 results) No results for input(s): PROBNP in the last 8760 hours.  CBG: Recent Labs  Lab 10/17/19 1657 10/17/19 2149 10/18/19 0753 10/18/19 1118 10/18/19 1648  GLUCAP 193* 143* 140* 189* 161*       Signed:  Nita Sells MD   [facility] 10/20/2019, 9:34 AM

## 2019-10-20 NOTE — Care Management Important Message (Signed)
Important Message  Patient Details IM Letter given to the Patient Name: Jessica Dennis MRN: [id number] Date of Birth: 05-01-1932   Medicare Important Message Given:  Yes     Kerin Salen 10/20/2019, 11:14 AM

## 2019-10-20 NOTE — Progress Notes (Signed)
Physical Therapy Treatment Patient Details Name: Jessica Dennis MRN: [id number] DOB: 07-24-1932 Today's Date: 10/20/2019    History of Present Illness Pt admitted s/p fall at home and after spending 10 hrs on floor.  Pt with muliple contusions and with lacerations to R knee now sutured.  Pt with hx of DM , Cirrhosis, and bil THR    PT Comments    Pt progressing well. incr tolerance to activity. VSS. Continue to recommend SNF post acute to maximize independence and safety    Follow Up Recommendations  SNF     Equipment Recommendations  None recommended by PT    Recommendations for Other Services       Precautions / Restrictions Precautions Precautions: Fall Restrictions Weight Bearing Restrictions: No    Mobility  Bed Mobility   Bed Mobility: Supine to Sit     Supine to sit: Supervision;Min guard     General bed mobility comments: for safety   Transfers Overall transfer level: Needs assistance Equipment used: Rolling walker (2 wheeled) Transfers: Sit to/from Stand Sit to Stand: Min assist;Min guard         General transfer comment: cues for hand placement and wt shift, min/min-guard for anterior-superior wt shift.   Ambulation/Gait Ambulation/Gait assistance: Min assist Gait Distance (Feet):  (hallway ambulation) Assistive device: Rolling walker (2 wheeled) Gait Pattern/deviations: Step-through pattern;Decreased stride length     General Gait Details: cues for posture, position from RW and safety awareness.  Physical assist for balance and RW management especially during turns   Stairs             Wheelchair Mobility    Modified Rankin (Stroke Patients Only)       Balance           Standing balance support: Bilateral upper extremity supported Standing balance-Leahy Scale: Poor (reliant on UEs )                              Cognition Arousal/Alertness: Awake/alert Behavior During Therapy: WFL for tasks  assessed/performed Overall Cognitive Status: Within Functional Limits for tasks assessed                                        Exercises      General Comments        Pertinent Vitals/Pain Pain Assessment: No/denies pain    Home Living                      Prior Function            PT Goals (current goals can now be found in the care plan section) Acute Rehab PT Goals Patient Stated Goal: Regain IND PT Goal Formulation: With patient Time For Goal Achievement: 11/01/19 Potential to Achieve Goals: Fair Progress towards PT goals: Progressing toward goals    Frequency    Min 3X/week      PT Plan Current plan remains appropriate    Co-evaluation              AM-PAC PT "6 Clicks" Mobility   Outcome Measure  Help needed turning from your back to your side while in a flat bed without using bedrails?: A Little Help needed moving from lying on your back to sitting on the side of a flat bed without using bedrails?: A Little Help needed  moving to and from a bed to a chair (including a wheelchair)?: A Little Help needed standing up from a chair using your arms (e.g., wheelchair or bedside chair)?: A Little Help needed to walk in hospital room?: A Little Help needed climbing 3-5 steps with a railing? : A Little 6 Click Score: 18    End of Session Equipment Utilized During Treatment: Gait belt Activity Tolerance: Patient tolerated treatment well Patient left: with call bell/phone within reach;in bed;with bed alarm set Nurse Communication: Mobility status PT Visit Diagnosis: Unsteadiness on feet (R26.81);Muscle weakness (generalized) (M62.81);Difficulty in walking, not elsewhere classified (R26.2)     Time: 4373-5789 PT Time Calculation (min) (ACUTE ONLY): 20 min  Charges:  $Gait Training: 8-22 mins                     Baxter Flattery, PT  Acute Rehab Dept ([place]) [phone] Pager [phone]  10/20/2019    [facility] 10/20/2019,  12:41 PM

## 2019-10-22 ENCOUNTER — Other Ambulatory Visit: Payer: Self-pay | Admitting: *Deleted

## 2019-10-22 LAB — CULTURE, BLOOD (ROUTINE X 2)
Culture: NO GROWTH
Culture: NO GROWTH
Special Requests: ADEQUATE
Special Requests: ADEQUATE

## 2019-10-22 NOTE — Patient Outreach (Signed)
  [facility]) Care Management Chronic Special Needs Program  10/22/2019  Name: Jocie Meroney DOB: 1932-04-21  MRN: [id number]  Ms. Laretta Pyatt is enrolled in a chronic special needs plan for Diabetes. Reviewed and updated care plan. Client admitted on 10/17/19 after a fall at home with multiple contusions and lacerations.  Discharged to [facility] for short term rehab on 10/20/19.    Goals Addressed            This Visit's Progress   . A1c goal less than 7%      . General - Client will not be readmitted within 30 days (C-SNP)       Please follow discharge instructions and call health care provider if you have any questions Attend all follow up appointments as scheduled Take medications as prescribed Call 24 hour nurse advice line as needed at [phone]      Plan:    Individualized care plan sent to client and primary care provider.  RN care manager will continue to follow and collaborate/ care coordinate as needed.  Chronic care management coordinator will outreach in:  Upon discharge from skilled nursing facility    [place] Case Manager, C-SNP [phone]  .

## 2019-10-23 ENCOUNTER — Telehealth: Payer: HMO

## 2019-10-23 ENCOUNTER — Ambulatory Visit: Payer: Self-pay | Admitting: Pharmacist

## 2019-10-23 NOTE — Chronic Care Management (AMB) (Deleted)
Chronic Care Management Pharmacy  Name: Valisha Heslin  MRN: [id number] DOB: 25-Jun-1932  Chief Complaint/ HPI  Jessica Dennis,  86 y.o. , female presents for their Follow-Up CCM visit with the clinical pharmacist via telephone due to COVID-19 Pandemic.  PCP : Ann Held, DO  Their chronic conditions include: DM, HTN, HLD, Hypothyroidism, GERD, Neuropathy, Insomnia  Office Visits:   Consult Visit:  ED Visit:  10/16/19:    Medications: Outpatient Encounter Medications as of 10/23/2019  Medication Sig Note  . Accu-Chek FastClix Lancets MISC CHECK FOR BLOOD SUGAR DAILY   . alclomethasone (ACLOVATE) 0.05 % cream Apply 1 application topically daily as needed.   Marland Kitchen ammonium lactate (AMLACTIN) 12 % cream Apply topically daily as needed.    Marland Kitchen Apoaequorin (PREVAGEN PO) Take 1 tablet by mouth daily.   . Ascorbic Acid (VITAMIN C) 1000 MG tablet Take 1,000 mg by mouth daily.   Marland Kitchen CALCIUM CARBONATE-VITAMIN D PO Take by mouth. Calcium 1229m  Vitamin D 5000 units  Pt also has a calcium 605mplus 800 units vitamin D supplement   . gabapentin (NEURONTIN) 100 MG capsule Take 1 capsule (100 mg total) by mouth 2 (two) times daily.   . Marland Kitchenentamicin ointment (GARAMYCIN) 0.1 % Apply 1 application topically as needed.  10/16/2019: LF 5/13  . Insulin Aspart Prot & Aspart (NOVOLOG MIX 70/30 [place]) Inject into the skin. 35 units in the mornings and 25 at night   . latanoprost (XALATAN) 0.005 % ophthalmic solution Place 1 drop into both eyes at bedtime.  10/16/2019: LF: 7/27 , 75 Indra Wolters supply   . levothyroxine (SYNTHROID) 75 MCG tablet TAKE ONE TABLET BY MOUTH DAILY 10/16/2019: LF 8/3, 90 days   . metFORMIN (GLUCOPHAGE-XR) 500 MG 24 hr tablet Take 500 mg by mouth daily. (Patient not taking: Reported on 10/16/2019)   . metoprolol succinate (TOPROL-XL) 25 MG 24 hr tablet Take 0.5 tablets (12.5 mg total) by mouth daily.   . pantoprazole (PROTONIX) 40 MG tablet Take 1 tablet (40 mg total) by mouth daily.     . Prednicarbate 0.1 % CREA Apply topically 2 (two) times daily.    . simvastatin (ZOCOR) 20 MG tablet TAKE ONE TABLET BY MOUTH EVERY NIGHT AT BEDTIME 10/16/2019: LF 8/3, 90 Nanako Stopher supply   . SSD 1 % cream 1 application daily.  04/13/2019: Uses PRN  . vitamin E 180 MG (400 UNITS) capsule Take by mouth daily.     No facility-administered encounter medications on file as of 10/23/2019.   SDOH Screenings   Alcohol Screen:   . Last Alcohol Screening Score (AUDIT): Not on file  Depression (PHQ2-9): Low Risk   . PHQ-2 Score: 0  Financial Resource Strain:   . Difficulty of Paying Living Expenses: Not on file  Food Insecurity:   . Worried About RuCharity fundraisern the Last Year: Not on file  . Ran Out of Food in the Last Year: Not on file  Housing:   . Last Housing Risk Score: Not on file  Physical Activity:   . Days of Exercise per Week: Not on file  . Minutes of Exercise per Session: Not on file  Social Connections:   . Frequency of Communication with Friends and Family: Not on file  . Frequency of Social Gatherings with Friends and Family: Not on file  . Attends Religious Services: Not on file  . Active Member of Clubs or Organizations: Not on file  . Attends ClArchivisteetings: Not  on file  . Marital Status: Not on file  Stress:   . Feeling of Stress : Not on file  Tobacco Use: Low Risk   . Smoking Tobacco Use: Never Smoker  . Smokeless Tobacco Use: Never Used  Transportation Needs:   . Film/video editor (Medical): Not on file  . Lack of Transportation (Non-Medical): Not on file     Current Diagnosis/Assessment:  Goals Addressed   None    Social: Moved here from [place] in 1969   Diabetes   A1c goal <7%   Followed by Dr. Chalmers Cater  Recent Relevant Labs: Lab Results  Component Value Date/Time   HGBA1C 6.3 04/28/2019 11:39 AM   HGBA1C 6.3 (H) 03/18/2018 10:40 AM   GFR 51.59 (L) 04/28/2019 11:39 AM   GFR 46.85 (L) 10/27/2018 02:43 PM   MICROALBUR 1.6  04/28/2019 11:39 AM   MICROALBUR 1.0 10/27/2018 02:43 PM    Last diabetic Eye exam:  Lab Results  Component Value Date/Time   HMDIABEYEEXA No Retinopathy 05/18/2019 12:00 AM    Last diabetic Foot exam: No results found for: HMDIABFOOTEX   From 04/13/19 Visit Recent FBG Readings:  199 207 207 188 168 176 196 148 192 200  Average 188.1  Checking BG: {CHL HP Blood Glucose Monitoring [phone]}   Recent FBG Readings: *** Recent pre-meal BG readings: *** Recent 2hr PP BG readings:  *** Recent HS BG readings: ***  Patient has failed these meds in past: metformin (GFR? Toleration?) Patient is currently controlled on the following medications:  Novolog 70/30 35 units AM +25 units PM  Update 09/25/19 A1c remained stable  We discussed: {CHL HP Upstream Pharmacy discussion:[phone]}  Plan -Continue current medications   Hypertension   BP goal is:  <140/90  Office blood pressures are  BP Readings from Last 3 Encounters:  10/20/19 (!) 112/57  07/24/19 110/64  05/20/19 (!) 128/55   Patient checks BP at home Never, does not have BP cuff Patient home BP readings are ranging: Unable to assess  Patient has failed these meds in the past: quinapril (listed in D/C meds. No apparent reason for D/C)  Patient is currently controlled on the following medications:  . Spironolactone 77m #2 daily  Update 09/25/19  Plan -Continue current medications   Hyperlipidemia   LDL goal <100  Lipid Panel     Component Value Date/Time   CHOL 124 04/28/2019 1139   TRIG 71.0 04/28/2019 1139   HDL 66.90 04/28/2019 1139   LDLCALC 43 04/28/2019 1139   LDLCALC 57 02/14/2018 1544    Hepatic Function Latest Ref Rng & Units 10/20/2019 10/19/2019 10/18/2019  Total Protein 6.5 - 8.1 g/dL 5.3(L) 5.1(L) 5.5(L)  Albumin 3.5 - 5.0 g/dL 2.7(L) 2.7(L) 2.9(L)  AST 15 - 41 U/L 48(H) 52(H) 56(H)  ALT 0 - 44 U/L 25 26 26   Alk Phosphatase 38 - 126 U/L 74 71 76  Total Bilirubin 0.3 - 1.2  mg/dL 1.8(H) 1.6(H) 1.9(H)     The ASCVD Risk score (Mikey BussingDC Jr., et al., 2013) failed to calculate for the following reasons:   The 2013 ASCVD risk score is only valid for ages 471to 74  Patient has failed these meds in past: None noted  Patient is currently controlled on the following medications:   Simvastatin 270mdaily  Fish oil 120098maily  Update 09/25/19 LDL remained stable and slightly lower (10/27/18 - 47)  Plan -Continue current medications   Vaccines   Reviewed and discussed patient's vaccination history.  Immunization History  Administered Date(s) Administered  . Fluad Quad(high Dose 65+) 10/27/2018  . Hepatitis B, adult 05/12/2014  . Hepatitis B, ped/adol 08/10/2014  . Influenza Split 11/02/2009, 10/15/2010  . Influenza, High Dose Seasonal PF 10/13/2014, 01/26/2017  . Influenza, Seasonal, Injecte, Preservative Fre 10/13/2014  . Influenza,inj,quad, With Preservative 11/22/2016  . Influenza-Unspecified 11/25/2013, 10/06/2015, 10/22/2017  . Moderna SARS-COVID-2 Vaccination 03/26/2019, 04/23/2019  . Pneumococcal Conjugate-13 05/17/2014  . Pneumococcal Polysaccharide-23 10/06/2015  . Tdap 10/02/2011, 11/06/2016  . Zoster 01/22/2005    Plan -Recommended patient receive Shingrix vaccine in pharmacy.

## 2019-10-23 NOTE — Chronic Care Management (AMB) (Signed)
Performed chart review in preparation for patient's CCM visit today.  Discovered that patient was discharged to a skilled nursing facility [place] [facility] for short term rehab on 10/20/19.  Called patient and she is unsure how long she will be in rehab. Noting this, she asked to cancel her appt with Dr. Etter Sjogren scheduled for 10/29/2019. Will plan to call patient next month to see if she is still in rehab or discharged home.

## 2019-10-29 ENCOUNTER — Ambulatory Visit: Payer: HMO | Admitting: Family Medicine

## 2019-11-02 ENCOUNTER — Other Ambulatory Visit: Payer: Self-pay | Admitting: Family Medicine

## 2019-11-02 MED ORDER — METOPROLOL SUCCINATE ER 25 MG PO TB24
12.5000 mg | ORAL_TABLET | Freq: Every day | ORAL | Status: DC
Start: 2019-11-02 — End: 2019-11-04

## 2019-11-02 MED ORDER — METFORMIN HCL ER 500 MG PO TB24
500.0000 mg | ORAL_TABLET | Freq: Every day | ORAL | 1 refills | Status: DC
Start: 2019-11-02 — End: 2021-02-24

## 2019-11-02 NOTE — Telephone Encounter (Signed)
Medication: metFORMIN (GLUCOPHAGE-XR) 500 MG 24 hr tablet [[id number]]   metoprolol succinate (TOPROL-XL) 25 MG 24 hr tablet [[id number]]     Has the patient contacted their pharmacy?  (If no, request that the patient contact the pharmacy for the refill.) (If yes, when and what did the pharmacy advise?)     Preferred Pharmacy (with phone number or street name): [facility], [address], [place] [address]  Phone:  [phone] Fax:  [phone]     Agent: Please be advised that RX refills may take up to 3 business days. We ask that you follow-up with your pharmacy.

## 2019-11-03 ENCOUNTER — Telehealth: Payer: Self-pay | Admitting: Family Medicine

## 2019-11-03 DIAGNOSIS — R296 Repeated falls: Secondary | ICD-10-CM

## 2019-11-03 NOTE — Telephone Encounter (Signed)
Caller: Evelina Dun  Call Back # [phone]   Received a call for this pt requesting PT referral/order.  Caller stated he could be reached at the call back # listed above, please advise

## 2019-11-04 ENCOUNTER — Other Ambulatory Visit: Payer: Self-pay

## 2019-11-04 MED ORDER — METOPROLOL SUCCINATE ER 25 MG PO TB24: 12.5000 mg | ORAL_TABLET | Freq: Every day | ORAL | 0 refills | Status: DC

## 2019-11-04 NOTE — Telephone Encounter (Signed)
Prescription sent for metoprolol #30 tabs to last until scheduled appointment on 11-19-19

## 2019-11-04 NOTE — Telephone Encounter (Signed)
Dr Carollee Herter -- It looks like RX sent on 10/11 for this pt did not have quantity or refills listed. Please advise so RX can be re sent.

## 2019-11-04 NOTE — Telephone Encounter (Signed)
Pt recently admitted into hosp d/t multiple falls. Pt has hosp f/u scheduled 11/09/2019 w/ PCP.

## 2019-11-04 NOTE — Telephone Encounter (Signed)
Caller: Phala Schraeder Call Back # [phone]   Pt's daughter in law called back stating [facility] has not received refill for the Metoprolol... Please advise   Thank you

## 2019-11-05 ENCOUNTER — Other Ambulatory Visit: Payer: Self-pay | Admitting: *Deleted

## 2019-11-05 NOTE — Telephone Encounter (Signed)
Noted  

## 2019-11-05 NOTE — Patient Outreach (Signed)
[facility]) Care Management Chronic Special Needs Program  11/05/2019  Name: Jessica Dennis DOB: 12-Jul-1932  MRN: [id number]  Jessica Dennis is enrolled in a chronic special needs plan for Diabetes. Reviewed and updated care plan.  Subjective: RN care manager talked with client for transition of care (client discharged from skilled nursing facility on 10/31/19 and has [place]).  Client states she is not interested in talking and prefers she receive everything by mail.  Goals    . A1c goal less than 7%    . Blood pressure goal less than 140/90    . Chronic Care Management Pharmacy Care Plan     CARE PLAN ENTRY (see longitudinal plan of care for additional care plan information)  Current Barriers:  . Chronic Disease Management support, education, and care coordination needs related to DM, HTN, HLD, Hypothyroidism, GERD, Neuropathy, Insomnia   Hypertension BP Readings from Last 3 Encounters:  07/24/19 110/64  05/20/19 (!) 128/55  04/28/19 118/60   . Pharmacist Clinical Goal(s): o Over the next 180 days, patient will work with PharmD and providers to maintain BP goal <140/90 . Current regimen:  o Spironolactone 50mg  #2 daily . Patient self care activities - Over the next 180 days, patient will: o Maintain hypertension medication regimen.   Hyperlipidemia Lab Results  Component Value Date/Time   LDLCALC 43 04/28/2019 11:39 AM   LDLCALC 57 02/14/2018 03:44 PM   . Pharmacist Clinical Goal(s): o Over the next 180 days, patient will work with PharmD and providers to maintain LDL goal < 100 . Current regimen:   Simvastatin 20mg  daily  Fish oil 1200mg  daily . Patient self care activities - Over the next 180 days, patient will: o Maintain cholesterol medication regimen.   Diabetes Lab Results  Component Value Date/Time   HGBA1C 6.3 04/28/2019 11:39 AM   HGBA1C 6.3 (H) 03/18/2018 10:40 AM   . Pharmacist Clinical Goal(s): o Over the next  180 days, patient will work with PharmD and providers to maintain A1c goal <7% . Current regimen:  o Novolog 70/30 35 units AM +25 units PM . Interventions: o Discussed DM goals - Fasting (before meal blood sugar): 80-130 - Post-Prandial (2 hrs after eating blood sugar): less than 180 - A1c (3 month blood sugar average): less than 7% . Patient self care activities - Over the next 180 days, patient will: o Maintain a1c <7%  Health Maintenance  . Pharmacist Clinical Goal(s) o Over the next 180 days, patient will work with PharmD and providers to complete health maintenance screenings/vaccinations . Interventions: o Consider completing Shingrix vaccine series at the pharmacy . Patient self care activities - Over the next 180 days, patient will: o Consider completing Shingrix vaccine series at the pharmacy  Medication management . Pharmacist Clinical Goal(s): o Over the next 180 days, patient will work with PharmD and providers to maintain optimal medication adherence . Current pharmacy: [facility] . Interventions o Comprehensive medication review performed. o Continue current medication management strategy . Patient self care activities - Over the next 180 days, patient will: o Focus on medication adherence by filling and taking medications appropriately  o Take medications as prescribed o Report any questions or concerns to PharmD and/or provider(s)  Please see past updates related to this goal by clicking on the "Past Updates" button in the selected goal      . Client understands the importance of follow-up with providers by attending scheduled visits     Continue  to follow up with primary care provider and specialists. Use the 24 hour nurse advice line as needed at [phone]    . Client will use Assistive Devices as needed and verbalize understanding of device use     RN care manager mailed EMMI education article " How to use a cane"     . Client will  verbalize knowledge of self management of Hypertension as evidences by BP reading of 140/90 or less; or as defined by provider     Take blood pressure medications as prescribed. Plan to check blood pressure regularly and take results to your health care provider appointments. Plan to follow a low salt diet. Increase activity as tolerated. Follow up with your health care provider as recommended. Please ask your health care provider " what is my target blood pressure range". RN care manager mailed EMMI education article " Diabetes and blood pressure"     . Client will verbalize safety precautions and report no falls within 9-12 months     Use walker or cane whenever walking, inside and outside the home. Inside your home: Don't use throw rugs, use plenty of lighting, keep walkways clear of clutter, and remove tripping hazards such as cords.  Be aware of small pets when walking. Consider installing bars in the hallway and grab bars in shower or tub.  [employer] can provide more information at [phone]. If you feel dizzy, sit still in a chair or on the bed when waking up.  Don't continue to walk around.   EMMI education article provided on  "Preventing falls in the older adult"  Review and plan to discuss with RN during next telephonic assessment.      . Find a new eye doctor    . General - Client will not be readmitted within 30 days (C-SNP)     Please follow discharge instructions and call health care provider if you have any questions Attend all follow up appointments as scheduled Take medications as prescribed Use 24 hour nurse advice line at [phone]    . Maintain timely refills of diabetic medication as prescribed within the year .     Take medications as prescribed. Follow up with your health care provider if you have any questions. Please contact your assigned RN care manager if you have difficulty obtaining medications.      . Obtain annual  Lipid  Profile, LDL-C     Per medical record review, Lipid profile completed on 10/27/18 LDL= 47 The goal for LDL is less than 70mg /dl as you are at high risk for complications. Try to avoid saturated fats, trans-fats and eat more fiber. Continue to follow up with your health care provider for any needed lab work.      . Obtain Annual Eye (retinal)  Exam      Plan to have a dilated eye exam every year. Last documented retinal eye exam was completed in 2017.     . Obtain Annual Foot Exam     Per medical record review, foot exam completed on 09/16/18 Your doctor should check your feet at least once a year. Plan to schedule a foot exam with your health care provider once every year. Check your skin and feet daily for cuts, bruises, redness, blisters or sore.     . Obtain annual screen for micro albuminuria (urine) , nephropathy (kidney problems)     Per medical record review, microalbuminuria completed on 10/27/18 Continue to obtain yearly physicals and lab  checks as recommended by your health care provider. It is important for your health care provider to check your urine for protein at least once every year.     . Obtain Hemoglobin A1C at least 2 times per year     AIC checked 03/18/18    . Only take 1 supplement for calcium/vitamin D     Recommended values of calcium and vitamin D: Calcium - 1200mg  daily Vitamin D - 800 units daily    . Visit Primary Care Provider or Endocrinologist at least 2 times per year      Review of medical record indicates client has completed  2 office visits with primary care provider in 2020 Call and schedule a yearly physical and follow up visit as recommended by your health care provider.        Plan:    RN care manager faxed today's note with updated individualized care plan to primary care provider, mailed updated individualized care plan to client along with education materials and successful outreach letter.  Chronic care management coordinator will  outreach in:  9-12 months  [place] Case Manager, C-SNP  [phone]  .

## 2019-11-05 NOTE — Telephone Encounter (Signed)
Okay to place referral

## 2019-11-05 NOTE — Telephone Encounter (Signed)
yes

## 2019-11-05 NOTE — Telephone Encounter (Signed)
Caller: Evelina Dun  Call Back # [phone]   Received a call for this pt requesting PT referral/order, again.  Caller stated he could be reached at the call back # listed above, please advise

## 2019-11-06 ENCOUNTER — Telehealth: Payer: Self-pay | Admitting: Family Medicine

## 2019-11-06 NOTE — Addendum Note (Signed)
Addended by: Sanda Linger on: 11/06/2019 11:46 AM   Modules accepted: Orders

## 2019-11-06 NOTE — Telephone Encounter (Signed)
Caller name: Sharee Pimple (daughter in law) Call back number: [phone]  Sharee Pimple states patient was taking off Lasix while she was hospitalized. She wants to know if she should restart taking them again?

## 2019-11-06 NOTE — Telephone Encounter (Signed)
Please advise 

## 2019-11-06 NOTE — Telephone Encounter (Signed)
Pt was supposed to have hosp f/u 1 week with labs ---- she needs ov

## 2019-11-06 NOTE — Telephone Encounter (Signed)
Referral placed.

## 2019-11-09 ENCOUNTER — Other Ambulatory Visit: Payer: Self-pay | Admitting: Family Medicine

## 2019-11-09 ENCOUNTER — Ambulatory Visit (HOSPITAL_BASED_OUTPATIENT_CLINIC_OR_DEPARTMENT_OTHER)
Admission: RE | Admit: 2019-11-09 | Discharge: 2019-11-09 | Disposition: A | Discharge status: Home / Self Care | Payer: HMO | Source: Ambulatory Visit | Attending: Family Medicine | Admitting: Family Medicine

## 2019-11-09 ENCOUNTER — Other Ambulatory Visit: Payer: Self-pay

## 2019-11-09 ENCOUNTER — Ambulatory Visit (INDEPENDENT_AMBULATORY_CARE_PROVIDER_SITE_OTHER): Payer: HMO | Admitting: Family Medicine

## 2019-11-09 ENCOUNTER — Encounter: Payer: Self-pay | Admitting: Family Medicine

## 2019-11-09 VITALS — BP 98/60 | HR 110 | Temp 98.1°F | Resp 18 | Ht 65.25 in | Wt 181.6 lb

## 2019-11-09 DIAGNOSIS — R296 Repeated falls: Secondary | ICD-10-CM | POA: Diagnosis not present

## 2019-11-09 DIAGNOSIS — R252 Cramp and spasm: Secondary | ICD-10-CM | POA: Diagnosis not present

## 2019-11-09 DIAGNOSIS — N179 Acute kidney failure, unspecified: Secondary | ICD-10-CM

## 2019-11-09 DIAGNOSIS — R0602 Shortness of breath: Secondary | ICD-10-CM

## 2019-11-09 DIAGNOSIS — E039 Hypothyroidism, unspecified: Secondary | ICD-10-CM

## 2019-11-09 DIAGNOSIS — E1169 Type 2 diabetes mellitus with other specified complication: Secondary | ICD-10-CM | POA: Diagnosis not present

## 2019-11-09 DIAGNOSIS — I4891 Unspecified atrial fibrillation: Secondary | ICD-10-CM

## 2019-11-09 DIAGNOSIS — I482 Chronic atrial fibrillation, unspecified: Secondary | ICD-10-CM

## 2019-11-09 DIAGNOSIS — R600 Localized edema: Secondary | ICD-10-CM

## 2019-11-09 DIAGNOSIS — J40 Bronchitis, not specified as acute or chronic: Secondary | ICD-10-CM

## 2019-11-09 DIAGNOSIS — E785 Hyperlipidemia, unspecified: Secondary | ICD-10-CM

## 2019-11-09 DIAGNOSIS — I10 Essential (primary) hypertension: Secondary | ICD-10-CM

## 2019-11-09 MED ORDER — SIMVASTATIN 20 MG PO TABS: 20.0000 mg | ORAL_TABLET | Freq: Every day | ORAL | 0 refills | Status: DC

## 2019-11-09 MED ORDER — AZITHROMYCIN 250 MG PO TABS: ORAL_TABLET | ORAL | 0 refills | Status: DC

## 2019-11-09 MED ORDER — FUROSEMIDE 20 MG PO TABS: 20.0000 mg | ORAL_TABLET | Freq: Two times a day (BID) | ORAL | 0 refills | Status: DC

## 2019-11-09 MED ORDER — LEVOTHYROXINE SODIUM 75 MCG PO TABS: 75.0000 ug | ORAL_TABLET | Freq: Every day | ORAL | 1 refills | Status: DC

## 2019-11-09 MED ORDER — GABAPENTIN 100 MG PO CAPS: 100.0000 mg | ORAL_CAPSULE | Freq: Two times a day (BID) | ORAL | 0 refills | Status: DC

## 2019-11-09 NOTE — Progress Notes (Signed)
Patient ID: Jessica Dennis, female    DOB: [birth year]  Age: 86 y.o. MRN: [id number]    Subjective:  Subjective  HPI Jessica Dennis presents for f/u hospital for mult falls,  aki and possible a fib.   Pt had possible episodes a fib and it was recommended she have a repeat ekg and event monitor with repeat labs.  Her lasix was stopped at the hospital and metoprolol was started but pt became sob and she started swelling again.   Review of Systems  Constitutional: Negative for appetite change, diaphoresis, fatigue and unexpected weight change.  Eyes: Negative for pain, redness and visual disturbance.  Respiratory: Positive for shortness of breath. Negative for cough, chest tightness and wheezing.   Cardiovascular: Positive for leg swelling. Negative for chest pain and palpitations.  Endocrine: Negative for cold intolerance, heat intolerance, polydipsia, polyphagia and polyuria.  Genitourinary: Negative for difficulty urinating, dysuria and frequency.  Neurological: Negative for dizziness, light-headedness, numbness and headaches.    History Past Medical History:  Diagnosis Date  . Anemia   . Angiodysplasia of ascending colon 10/25/2014  . Anxiety   . Arthritis   . Borderline diabetes   . Cancer of the skin, basal cell 09/03/2012  . Cirrhosis ([place])   . Diabetes mellitus without complication ([place])   . Diverticular disease   . 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 GERD (gastroesophageal reflux disease)   . Hypertension   . Iron deficiency anemia due to chronic blood loss 01/19/2015  . Portal hypertensive gastropathy ([place]) 08/02/2016   ? Some GAVE also  . Thyroid disease     She has a past surgical history that includes Abdominal hysterectomy; Total hip arthroplasty (Bilateral, 1993, 2006); Appendectomy; Tonsillectomy; Leg skin lesion  biopsy / excision (12/11/14); Mohs surgery; Esophagogastroduodenoscopy; IR Paracentesis (05/25/2016); Colonoscopy; and Upper gastrointestinal  endoscopy (09/09/2017).   Her family history includes Bladder Cancer in her father; Diabetes in her mother; Heart attack in her father.She reports that she has never smoked. She has never used smokeless tobacco. She reports that she does not drink alcohol and does not use drugs.  Current Outpatient Medications on File Prior to Visit  Medication Sig Dispense Refill  . Accu-Chek FastClix Lancets MISC CHECK FOR BLOOD SUGAR DAILY 102 each 5  . alclomethasone (ACLOVATE) 0.05 % cream Apply 1 application topically daily as needed.    Marland Kitchen ammonium lactate (AMLACTIN) 12 % cream Apply topically daily as needed.     Marland Kitchen Apoaequorin (PREVAGEN PO) Take 1 tablet by mouth daily.    . Ascorbic Acid (VITAMIN C) 1000 MG tablet Take 1,000 mg by mouth daily.    Marland Kitchen CALCIUM CARBONATE-VITAMIN D PO Take by mouth. Calcium 1200mg   Vitamin D 5000 units  Pt also has a calcium 600mg  plus 800 units vitamin D supplement    . gentamicin ointment (GARAMYCIN) 0.1 % Apply 1 application topically as needed.     . Insulin Aspart Prot & Aspart (NOVOLOG MIX 70/30 [place]) Inject into the skin. 35 units in the mornings and 25 at night    . latanoprost (XALATAN) 0.005 % ophthalmic solution Place 1 drop into both eyes at bedtime.     . metFORMIN (GLUCOPHAGE-XR) 500 MG 24 hr tablet Take 1 tablet (500 mg total) by mouth daily. 180 tablet 1  . metoprolol succinate (TOPROL-XL) 25 MG 24 hr tablet Take 0.5 tablets (12.5 mg total) by mouth daily. 30 tablet 0  . pantoprazole (PROTONIX) 40 MG tablet Take 1 tablet (40 mg total)  by mouth daily. 90 tablet 3  . Prednicarbate 0.1 % CREA Apply topically 2 (two) times daily.     Marland Kitchen SSD 1 % cream 1 application daily.     . vitamin E 180 MG (400 UNITS) capsule Take by mouth daily.      No current facility-administered medications on file prior to visit.     Objective:  Objective  Physical Exam Vitals and nursing note reviewed.  Constitutional:      Appearance: She is well-developed.  HENT:     Head:  Normocephalic and atraumatic.  Eyes:     Conjunctiva/sclera: Conjunctivae normal.  Neck:     Thyroid: No thyromegaly.     Vascular: No carotid bruit or JVD.  Cardiovascular:     Rate and Rhythm: Normal rate. Rhythm irregular.     Heart sounds: Normal heart sounds. No murmur heard.   Pulmonary:     Effort: Pulmonary effort is normal. No respiratory distress.     Breath sounds: Normal breath sounds. No wheezing or rales.  Chest:     Chest wall: No tenderness.  Musculoskeletal:        General: No swelling.     Cervical back: Normal range of motion and neck supple.     Right lower leg: No edema.     Left lower leg: No edema.  Neurological:     Mental Status: She is alert and oriented to person, place, and time.   ekg--? afib BP 98/60 (BP Location: Right Arm, Patient Position: Sitting, Cuff Size: Normal)   Pulse (!) 110   Temp 98.1 F (36.7 C) (Oral)   Resp 18   Ht 5' 5.25" (1.657 m)   Wt 181 lb 9.6 oz (82.4 kg)   SpO2 93%   BMI 29.99 kg/m  Wt Readings from Last 3 Encounters:  11/09/19 181 lb 9.6 oz (82.4 kg)  07/24/19 174 lb 2 oz (79 kg)  05/20/19 174 lb 6.4 oz (79.1 kg)     Lab Results  Component Value Date   WBC 5.4 10/20/2019   HGB 9.4 (L) 10/20/2019   HCT 30.5 (L) 10/20/2019   PLT 68 (L) 10/20/2019   GLUCOSE 93 10/20/2019   CHOL 124 04/28/2019   TRIG 71.0 04/28/2019   HDL 66.90 04/28/2019   LDLCALC 43 04/28/2019   ALT 25 10/20/2019   AST 48 (H) 10/20/2019   NA 137 10/20/2019   K 3.7 10/20/2019   CL 103 10/20/2019   CREATININE 0.88 10/20/2019   BUN 15 10/20/2019   CO2 25 10/20/2019   TSH 3.31 04/28/2019   INR 1.4 (H) 10/16/2019   HGBA1C 6.3 04/28/2019   MICROALBUR 1.6 04/28/2019    CT HEAD WO CONTRAST  Result Date: 10/16/2019 CLINICAL DATA:  Fall EXAM: CT HEAD WITHOUT CONTRAST CT CERVICAL SPINE WITHOUT CONTRAST TECHNIQUE: Multidetector CT imaging of the head and cervical spine was performed following the standard protocol without intravenous  contrast. Multiplanar CT image reconstructions of the cervical spine were also generated. COMPARISON:  None. FINDINGS: CT HEAD FINDINGS Brain: No evidence of acute infarction, hemorrhage, hydrocephalus, extra-axial collection, visible mass lesion or mass effect. Symmetric prominence of the ventricles, cisterns and sulci compatible with parenchymal volume loss. Patchy areas of white matter hypoattenuation are most compatible with chronic microvascular angiopathy. Vascular: 8 mm rounded intermediate attenuation (42 HU) structure along the anterior interventricular septum in the vicinity of the anterior communicating artery/A1 segment on the right, concerning for potential aneurysm. Calcifications of the carotid siphons and intradural  vertebral arteries are noted as well. No concerning hyperdense vessel. Skull: No calvarial fracture or suspicious osseous lesion. No scalp swelling or hematoma. Small mineralization along the superolateral aspect of the left orbit, could reflect a benign dermal calcification. Sinuses/Orbits: Paranasal sinuses and mastoid air cells are predominantly clear. Likely remote deformity of the nasal bones in the absence of overlying swelling. Could correlate for point tenderness. Included orbital structures are unremarkable. Other: None CT CERVICAL SPINE FINDINGS Alignment: Stabilization collar is absent at the time of examination. There is straightening and slight reversal the normal cervical lordosis which is centered at the C5 level. 3 mm anterolisthesis C3 on C4. Stepwise 1-2 mm retrolisthesis C5-C7. 4 mm anterolisthesis C7 on T1. Spondylolisthesis is favored to be on a degenerative basis given associated endplate changes and facet degenerative features at these levels. No evidence of traumatic listhesis. No abnormally widened, perched or jumped facets. Normal alignment of the craniocervical and atlantoaxial articulations accounting for rightward cranial rotation. Skull base and vertebrae: No  acute skull base fracture. No vertebral body fracture or height loss. Normal bone mineralization. No worrisome osseous lesions. Multilevel Schmorl's node formations are present. Moderate arthrosis at the atlantodental and basion dens intervals. Spondylitic changes of the spine detailed further above and below. Soft tissues and spinal canal: No pre or paravertebral fluid or swelling. No visible canal hematoma. Disc levels: Multilevel intervertebral disc height loss with spondylitic endplate changes. Features most pronounced C5-6 where a slightly larger disc osteophyte complex results in some mild to moderate canal stenosis. Additional mild narrowings noted C4-5, and C6-T1. Multilevel uncinate spurring and facet hypertrophic changes are present throughout the cervical spine as well resulting in some mild multilevel neural foraminal narrowing with more moderate narrowing bilaterally C5-6. Upper chest: No acute abnormality in the upper chest or imaged lung apices. Other: Cervical carotid atherosclerosis. No concerning thyroid nodules. IMPRESSION: 1. No acute intracranial abnormality. No scalp swelling or calvarial fracture. 2. Likely remote deformity of the nasal bones in the absence of overlying swelling. Could correlate for point tenderness. 3. No evidence of acute fracture or traumatic listhesis of the cervical spine. 4. Spondylitic changes of the cervical spine, as detailed above. Maximal changes resulting in mild-to-moderate canal stenosis and foraminal narrowing C5-6. 5. Cervical and intracranial atherosclerosis. 6. 8 mm rounded intermediate attenuation structure along the anterior interventricular septum in the vicinity of the anterior communicating artery/A1 segment on the right, concerning for potential aneurysm. Consider further assessment with outpatient MRI/MRA. Electronically Signed   By: Lovena Le M.D.   On: 10/16/2019 21:01   CT CERVICAL SPINE WO CONTRAST  Result Date: 10/16/2019 CLINICAL DATA:   Fall EXAM: CT HEAD WITHOUT CONTRAST CT CERVICAL SPINE WITHOUT CONTRAST TECHNIQUE: Multidetector CT imaging of the head and cervical spine was performed following the standard protocol without intravenous contrast. Multiplanar CT image reconstructions of the cervical spine were also generated. COMPARISON:  None. FINDINGS: CT HEAD FINDINGS Brain: No evidence of acute infarction, hemorrhage, hydrocephalus, extra-axial collection, visible mass lesion or mass effect. Symmetric prominence of the ventricles, cisterns and sulci compatible with parenchymal volume loss. Patchy areas of white matter hypoattenuation are most compatible with chronic microvascular angiopathy. Vascular: 8 mm rounded intermediate attenuation (42 HU) structure along the anterior interventricular septum in the vicinity of the anterior communicating artery/A1 segment on the right, concerning for potential aneurysm. Calcifications of the carotid siphons and intradural vertebral arteries are noted as well. No concerning hyperdense vessel. Skull: No calvarial fracture or suspicious osseous lesion. No scalp swelling  or hematoma. Small mineralization along the superolateral aspect of the left orbit, could reflect a benign dermal calcification. Sinuses/Orbits: Paranasal sinuses and mastoid air cells are predominantly clear. Likely remote deformity of the nasal bones in the absence of overlying swelling. Could correlate for point tenderness. Included orbital structures are unremarkable. Other: None CT CERVICAL SPINE FINDINGS Alignment: Stabilization collar is absent at the time of examination. There is straightening and slight reversal the normal cervical lordosis which is centered at the C5 level. 3 mm anterolisthesis C3 on C4. Stepwise 1-2 mm retrolisthesis C5-C7. 4 mm anterolisthesis C7 on T1. Spondylolisthesis is favored to be on a degenerative basis given associated endplate changes and facet degenerative features at these levels. No evidence of  traumatic listhesis. No abnormally widened, perched or jumped facets. Normal alignment of the craniocervical and atlantoaxial articulations accounting for rightward cranial rotation. Skull base and vertebrae: No acute skull base fracture. No vertebral body fracture or height loss. Normal bone mineralization. No worrisome osseous lesions. Multilevel Schmorl's node formations are present. Moderate arthrosis at the atlantodental and basion dens intervals. Spondylitic changes of the spine detailed further above and below. Soft tissues and spinal canal: No pre or paravertebral fluid or swelling. No visible canal hematoma. Disc levels: Multilevel intervertebral disc height loss with spondylitic endplate changes. Features most pronounced C5-6 where a slightly larger disc osteophyte complex results in some mild to moderate canal stenosis. Additional mild narrowings noted C4-5, and C6-T1. Multilevel uncinate spurring and facet hypertrophic changes are present throughout the cervical spine as well resulting in some mild multilevel neural foraminal narrowing with more moderate narrowing bilaterally C5-6. Upper chest: No acute abnormality in the upper chest or imaged lung apices. Other: Cervical carotid atherosclerosis. No concerning thyroid nodules. IMPRESSION: 1. No acute intracranial abnormality. No scalp swelling or calvarial fracture. 2. Likely remote deformity of the nasal bones in the absence of overlying swelling. Could correlate for point tenderness. 3. No evidence of acute fracture or traumatic listhesis of the cervical spine. 4. Spondylitic changes of the cervical spine, as detailed above. Maximal changes resulting in mild-to-moderate canal stenosis and foraminal narrowing C5-6. 5. Cervical and intracranial atherosclerosis. 6. 8 mm rounded intermediate attenuation structure along the anterior interventricular septum in the vicinity of the anterior communicating artery/A1 segment on the right, concerning for potential  aneurysm. Consider further assessment with outpatient MRI/MRA. Electronically Signed   By: Lovena Le M.D.   On: 10/16/2019 21:01   DG Chest Port 1 View  Result Date: 10/16/2019 CLINICAL DATA:  Fall, pain. EXAM: PORTABLE CHEST 1 VIEW COMPARISON:  Chest radiograph 10/02/2011 FINDINGS: The cardiomediastinal contours are normal. Aortic atherosclerosis. Pulmonary vasculature is normal. No consolidation, pleural effusion, or pneumothorax. No acute osseous abnormalities are seen. Mild chronic change about the right shoulder. IMPRESSION: No acute chest findings. Electronically Signed   By: Keith Rake M.D.   On: 10/16/2019 20:39   DG Knee Complete 4 Views Right  Result Date: 10/16/2019 CLINICAL DATA:  Trip and fall this morning.  Right knee laceration. EXAM: RIGHT KNEE - COMPLETE 4+ VIEW COMPARISON:  None. FINDINGS: No evidence of fracture or dislocation. Trace joint effusion. Mild tricompartmental peripheral spurring. Chondrocalcinosis in the lateral tibiofemoral compartment. There is generalized soft tissue edema. Small amount of soft tissue air in the anterior knee soft tissues. IMPRESSION: 1. No fracture or subluxation of the right knee. 2. Generalized soft tissue edema with small amount of soft tissue air in the anterior knee soft tissues consistent with laceration. 3. Chondrocalcinosis in the  lateral tibiofemoral compartment. Mild osteoarthritis. Electronically Signed   By: Keith Rake M.D.   On: 10/16/2019 20:37     Assessment & Plan:  Plan  I have changed Jessica Dennis "Trudi"'s simvastatin and levothyroxine. I am also having her maintain her Prednicarbate, latanoprost, vitamin C, CALCIUM CARBONATE-VITAMIN D PO, vitamin E, Insulin Aspart Prot & Aspart (NOVOLOG MIX 70/30 [place]), SSD, ammonium lactate, Accu-Chek FastClix Lancets, gentamicin ointment, pantoprazole, alclomethasone, Apoaequorin (PREVAGEN PO), metFORMIN, metoprolol succinate, and gabapentin.  Meds ordered this encounter   Medications  . simvastatin (ZOCOR) 20 MG tablet    Sig: Take 1 tablet (20 mg total) by mouth at bedtime.    Dispense:  90 tablet    Refill:  0  . gabapentin (NEURONTIN) 100 MG capsule    Sig: Take 1 capsule (100 mg total) by mouth 2 (two) times daily.    Dispense:  8 capsule    Refill:  0  . levothyroxine (SYNTHROID) 75 MCG tablet    Sig: Take 1 tablet (75 mcg total) by mouth daily.    Dispense:  90 tablet    Refill:  1    Problem List Items Addressed This Visit      Unprioritized   AKI (acute kidney injury) ([place])    Recheck labs      Chronic atrial fibrillation ([place])    Suspected ----- event monitor ordered  Refer to cardiology       Relevant Medications   simvastatin (ZOCOR) 20 MG tablet   Other Relevant Orders   Cardiac event monitor   Essential hypertension    Running low Pt is on aldactone and lasix Metoprolol stopped due to fatigue and low bp -- pt daughter in law stopped it       Relevant Medications   simvastatin (ZOCOR) 20 MG tablet   Hyperlipidemia associated with type 2 diabetes mellitus ([facility])   Relevant Medications   simvastatin (ZOCOR) 20 MG tablet   Other Relevant Orders   Comprehensive metabolic panel   Lipid panel   Hypothyroidism   Relevant Medications   levothyroxine (SYNTHROID) 75 MCG tablet   Other Relevant Orders   TSH   CBC with Differential/Platelet   Comprehensive metabolic panel   Lipid panel   Shortness of breath    Improved with lasix Check cxr Check echo Refer to cardiology      Relevant Orders   ECHOCARDIOGRAM COMPLETE   DG Chest 2 View (Completed)    Other Visit Diagnoses    Frequent falls    -  Primary   Relevant Orders   EKG 12-Lead (Completed)   Cardiac event monitor   Leg cramps       Relevant Medications   gabapentin (NEURONTIN) 100 MG capsule   Other Relevant Orders   CBC with Differential/Platelet   Comprehensive metabolic panel      Follow-up: Return in about 3 months (around 02/09/2020), or if  symptoms worsen or fail to improve.  Ann Held, DO

## 2019-11-09 NOTE — Assessment & Plan Note (Signed)
Improved with lasix Check cxr Check echo Refer to cardiology

## 2019-11-09 NOTE — Assessment & Plan Note (Signed)
Running low Pt is on aldactone and lasix Metoprolol stopped due to fatigue and low bp -- pt daughter in law stopped it

## 2019-11-09 NOTE — Progress Notes (Deleted)
Patient ID: Jessica Dennis, female    DOB: 1932/08/09  Age: 86 y.o. MRN: [id number]    Subjective:  Subjective  HPI Jessica Dennis presents for f/u hospital for aki, possible a fib and frequent falls     Lasix was stopped in the hospital but the pt started getting sob and swelling so her daughter in law put her back on 1 a day.  She still c/o some sob but it is better    No palpitations or cp   Review of Systems  History Past Medical History:  Diagnosis Date  . Anemia   . Angiodysplasia of ascending colon 10/25/2014  . Anxiety   . Arthritis   . Borderline diabetes   . Cancer of the skin, basal cell 09/03/2012  . Cirrhosis ([place])   . Diabetes mellitus without complication ([place])   . Diverticular disease   . 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 GERD (gastroesophageal reflux disease)   . Hypertension   . Iron deficiency anemia due to chronic blood loss 01/19/2015  . Portal hypertensive gastropathy ([place]) 08/02/2016   ? Some GAVE also  . Thyroid disease     She has a past surgical history that includes Abdominal hysterectomy; Total hip arthroplasty (Bilateral, 1993, 2006); Appendectomy; Tonsillectomy; Leg skin lesion  biopsy / excision (12/11/14); Mohs surgery; Esophagogastroduodenoscopy; IR Paracentesis (05/25/2016); Colonoscopy; and Upper gastrointestinal endoscopy (09/09/2017).   Her family history includes Bladder Cancer in her father; Diabetes in her mother; Heart attack in her father.She reports that she has never smoked. She has never used smokeless tobacco. She reports that she does not drink alcohol and does not use drugs.  Current Outpatient Medications on File Prior to Visit  Medication Sig Dispense Refill  . Accu-Chek FastClix Lancets MISC CHECK FOR BLOOD SUGAR DAILY 102 each 5  . alclomethasone (ACLOVATE) 0.05 % cream Apply 1 application topically daily as needed.    Marland Kitchen ammonium lactate (AMLACTIN) 12 % cream Apply topically daily as needed.     Marland Kitchen Apoaequorin  (PREVAGEN PO) Take 1 tablet by mouth daily.    . Ascorbic Acid (VITAMIN C) 1000 MG tablet Take 1,000 mg by mouth daily.    Marland Kitchen CALCIUM CARBONATE-VITAMIN D PO Take by mouth. Calcium 1200mg   Vitamin D 5000 units  Pt also has a calcium 600mg  plus 800 units vitamin D supplement    . gabapentin (NEURONTIN) 100 MG capsule Take 1 capsule (100 mg total) by mouth 2 (two) times daily. 8 capsule 0  . gentamicin ointment (GARAMYCIN) 0.1 % Apply 1 application topically as needed.     . Insulin Aspart Prot & Aspart (NOVOLOG MIX 70/30 ) Inject into the skin. 35 units in the mornings and 25 at night    . latanoprost (XALATAN) 0.005 % ophthalmic solution Place 1 drop into both eyes at bedtime.     Marland Kitchen levothyroxine (SYNTHROID) 75 MCG tablet Take 1 tablet (75 mcg total) by mouth daily. 90 tablet 1  . metFORMIN (GLUCOPHAGE-XR) 500 MG 24 hr tablet Take 1 tablet (500 mg total) by mouth daily. 180 tablet 1  . metoprolol succinate (TOPROL-XL) 25 MG 24 hr tablet Take 0.5 tablets (12.5 mg total) by mouth daily. 30 tablet 0  . pantoprazole (PROTONIX) 40 MG tablet Take 1 tablet (40 mg total) by mouth daily. 90 tablet 3  . Prednicarbate 0.1 % CREA Apply topically 2 (two) times daily.     . simvastatin (ZOCOR) 20 MG tablet Take 1 tablet (20 mg total) by mouth at  bedtime. 90 tablet 0  . SSD 1 % cream 1 application daily.     . vitamin E 180 MG (400 UNITS) capsule Take by mouth daily.      No current facility-administered medications on file prior to visit.     Objective:  Objective  Physical Exam There were no vitals taken for this visit. Wt Readings from Last 3 Encounters:  11/09/19 181 lb 9.6 oz (82.4 kg)  07/24/19 174 lb 2 oz (79 kg)  05/20/19 174 lb 6.4 oz (79.1 kg)     Lab Results  Component Value Date   WBC 5.4 10/20/2019   HGB 9.4 (L) 10/20/2019   HCT 30.5 (L) 10/20/2019   PLT 68 (L) 10/20/2019   GLUCOSE 93 10/20/2019   CHOL 124 04/28/2019   TRIG 71.0 04/28/2019   HDL 66.90 04/28/2019   LDLCALC 43  04/28/2019   ALT 25 10/20/2019   AST 48 (H) 10/20/2019   NA 137 10/20/2019   K 3.7 10/20/2019   CL 103 10/20/2019   CREATININE 0.88 10/20/2019   BUN 15 10/20/2019   CO2 25 10/20/2019   TSH 3.31 04/28/2019   INR 1.4 (H) 10/16/2019   HGBA1C 6.3 04/28/2019   MICROALBUR 1.6 04/28/2019    CT HEAD WO CONTRAST  Result Date: 10/16/2019 CLINICAL DATA:  Fall EXAM: CT HEAD WITHOUT CONTRAST CT CERVICAL SPINE WITHOUT CONTRAST TECHNIQUE: Multidetector CT imaging of the head and cervical spine was performed following the standard protocol without intravenous contrast. Multiplanar CT image reconstructions of the cervical spine were also generated. COMPARISON:  None. FINDINGS: CT HEAD FINDINGS Brain: No evidence of acute infarction, hemorrhage, hydrocephalus, extra-axial collection, visible mass lesion or mass effect. Symmetric prominence of the ventricles, cisterns and sulci compatible with parenchymal volume loss. Patchy areas of white matter hypoattenuation are most compatible with chronic microvascular angiopathy. Vascular: 8 mm rounded intermediate attenuation (42 HU) structure along the anterior interventricular septum in the vicinity of the anterior communicating artery/A1 segment on the right, concerning for potential aneurysm. Calcifications of the carotid siphons and intradural vertebral arteries are noted as well. No concerning hyperdense vessel. Skull: No calvarial fracture or suspicious osseous lesion. No scalp swelling or hematoma. Small mineralization along the superolateral aspect of the left orbit, could reflect a benign dermal calcification. Sinuses/Orbits: Paranasal sinuses and mastoid air cells are predominantly clear. Likely remote deformity of the nasal bones in the absence of overlying swelling. Could correlate for point tenderness. Included orbital structures are unremarkable. Other: None CT CERVICAL SPINE FINDINGS Alignment: Stabilization collar is absent at the time of examination. There is  straightening and slight reversal the normal cervical lordosis which is centered at the C5 level. 3 mm anterolisthesis C3 on C4. Stepwise 1-2 mm retrolisthesis C5-C7. 4 mm anterolisthesis C7 on T1. Spondylolisthesis is favored to be on a degenerative basis given associated endplate changes and facet degenerative features at these levels. No evidence of traumatic listhesis. No abnormally widened, perched or jumped facets. Normal alignment of the craniocervical and atlantoaxial articulations accounting for rightward cranial rotation. Skull base and vertebrae: No acute skull base fracture. No vertebral body fracture or height loss. Normal bone mineralization. No worrisome osseous lesions. Multilevel Schmorl's node formations are present. Moderate arthrosis at the atlantodental and basion dens intervals. Spondylitic changes of the spine detailed further above and below. Soft tissues and spinal canal: No pre or paravertebral fluid or swelling. No visible canal hematoma. Disc levels: Multilevel intervertebral disc height loss with spondylitic endplate changes. Features most pronounced C5-6  where a slightly larger disc osteophyte complex results in some mild to moderate canal stenosis. Additional mild narrowings noted C4-5, and C6-T1. Multilevel uncinate spurring and facet hypertrophic changes are present throughout the cervical spine as well resulting in some mild multilevel neural foraminal narrowing with more moderate narrowing bilaterally C5-6. Upper chest: No acute abnormality in the upper chest or imaged lung apices. Other: Cervical carotid atherosclerosis. No concerning thyroid nodules. IMPRESSION: 1. No acute intracranial abnormality. No scalp swelling or calvarial fracture. 2. Likely remote deformity of the nasal bones in the absence of overlying swelling. Could correlate for point tenderness. 3. No evidence of acute fracture or traumatic listhesis of the cervical spine. 4. Spondylitic changes of the cervical  spine, as detailed above. Maximal changes resulting in mild-to-moderate canal stenosis and foraminal narrowing C5-6. 5. Cervical and intracranial atherosclerosis. 6. 8 mm rounded intermediate attenuation structure along the anterior interventricular septum in the vicinity of the anterior communicating artery/A1 segment on the right, concerning for potential aneurysm. Consider further assessment with outpatient MRI/MRA. Electronically Signed   By: Lovena Le M.D.   On: 10/16/2019 21:01   CT CERVICAL SPINE WO CONTRAST  Result Date: 10/16/2019 CLINICAL DATA:  Fall EXAM: CT HEAD WITHOUT CONTRAST CT CERVICAL SPINE WITHOUT CONTRAST TECHNIQUE: Multidetector CT imaging of the head and cervical spine was performed following the standard protocol without intravenous contrast. Multiplanar CT image reconstructions of the cervical spine were also generated. COMPARISON:  None. FINDINGS: CT HEAD FINDINGS Brain: No evidence of acute infarction, hemorrhage, hydrocephalus, extra-axial collection, visible mass lesion or mass effect. Symmetric prominence of the ventricles, cisterns and sulci compatible with parenchymal volume loss. Patchy areas of white matter hypoattenuation are most compatible with chronic microvascular angiopathy. Vascular: 8 mm rounded intermediate attenuation (42 HU) structure along the anterior interventricular septum in the vicinity of the anterior communicating artery/A1 segment on the right, concerning for potential aneurysm. Calcifications of the carotid siphons and intradural vertebral arteries are noted as well. No concerning hyperdense vessel. Skull: No calvarial fracture or suspicious osseous lesion. No scalp swelling or hematoma. Small mineralization along the superolateral aspect of the left orbit, could reflect a benign dermal calcification. Sinuses/Orbits: Paranasal sinuses and mastoid air cells are predominantly clear. Likely remote deformity of the nasal bones in the absence of overlying  swelling. Could correlate for point tenderness. Included orbital structures are unremarkable. Other: None CT CERVICAL SPINE FINDINGS Alignment: Stabilization collar is absent at the time of examination. There is straightening and slight reversal the normal cervical lordosis which is centered at the C5 level. 3 mm anterolisthesis C3 on C4. Stepwise 1-2 mm retrolisthesis C5-C7. 4 mm anterolisthesis C7 on T1. Spondylolisthesis is favored to be on a degenerative basis given associated endplate changes and facet degenerative features at these levels. No evidence of traumatic listhesis. No abnormally widened, perched or jumped facets. Normal alignment of the craniocervical and atlantoaxial articulations accounting for rightward cranial rotation. Skull base and vertebrae: No acute skull base fracture. No vertebral body fracture or height loss. Normal bone mineralization. No worrisome osseous lesions. Multilevel Schmorl's node formations are present. Moderate arthrosis at the atlantodental and basion dens intervals. Spondylitic changes of the spine detailed further above and below. Soft tissues and spinal canal: No pre or paravertebral fluid or swelling. No visible canal hematoma. Disc levels: Multilevel intervertebral disc height loss with spondylitic endplate changes. Features most pronounced C5-6 where a slightly larger disc osteophyte complex results in some mild to moderate canal stenosis. Additional mild narrowings noted C4-5, and  C6-T1. Multilevel uncinate spurring and facet hypertrophic changes are present throughout the cervical spine as well resulting in some mild multilevel neural foraminal narrowing with more moderate narrowing bilaterally C5-6. Upper chest: No acute abnormality in the upper chest or imaged lung apices. Other: Cervical carotid atherosclerosis. No concerning thyroid nodules. IMPRESSION: 1. No acute intracranial abnormality. No scalp swelling or calvarial fracture. 2. Likely remote deformity of  the nasal bones in the absence of overlying swelling. Could correlate for point tenderness. 3. No evidence of acute fracture or traumatic listhesis of the cervical spine. 4. Spondylitic changes of the cervical spine, as detailed above. Maximal changes resulting in mild-to-moderate canal stenosis and foraminal narrowing C5-6. 5. Cervical and intracranial atherosclerosis. 6. 8 mm rounded intermediate attenuation structure along the anterior interventricular septum in the vicinity of the anterior communicating artery/A1 segment on the right, concerning for potential aneurysm. Consider further assessment with outpatient MRI/MRA. Electronically Signed   By: Lovena Le M.D.   On: 10/16/2019 21:01   DG Chest Port 1 View  Result Date: 10/16/2019 CLINICAL DATA:  Fall, pain. EXAM: PORTABLE CHEST 1 VIEW COMPARISON:  Chest radiograph 10/02/2011 FINDINGS: The cardiomediastinal contours are normal. Aortic atherosclerosis. Pulmonary vasculature is normal. No consolidation, pleural effusion, or pneumothorax. No acute osseous abnormalities are seen. Mild chronic change about the right shoulder. IMPRESSION: No acute chest findings. Electronically Signed   By: Keith Rake M.D.   On: 10/16/2019 20:39   DG Knee Complete 4 Views Right  Result Date: 10/16/2019 CLINICAL DATA:  Trip and fall this morning.  Right knee laceration. EXAM: RIGHT KNEE - COMPLETE 4+ VIEW COMPARISON:  None. FINDINGS: No evidence of fracture or dislocation. Trace joint effusion. Mild tricompartmental peripheral spurring. Chondrocalcinosis in the lateral tibiofemoral compartment. There is generalized soft tissue edema. Small amount of soft tissue air in the anterior knee soft tissues. IMPRESSION: 1. No fracture or subluxation of the right knee. 2. Generalized soft tissue edema with small amount of soft tissue air in the anterior knee soft tissues consistent with laceration. 3. Chondrocalcinosis in the lateral tibiofemoral compartment. Mild  osteoarthritis. Electronically Signed   By: Keith Rake M.D.   On: 10/16/2019 20:37     Assessment & Plan:  Plan  I am having Jessica Graves "Trudi" maintain her Prednicarbate, latanoprost, vitamin C, CALCIUM CARBONATE-VITAMIN D PO, vitamin E, Insulin Aspart Prot & Aspart (NOVOLOG MIX 70/30 [name]), SSD, ammonium lactate, Accu-Chek FastClix Lancets, gentamicin ointment, pantoprazole, alclomethasone, Apoaequorin (PREVAGEN PO), metFORMIN, metoprolol succinate, simvastatin, gabapentin, and levothyroxine.  No orders of the defined types were placed in this encounter.   Problem List Items Addressed This Visit    None    Visit Diagnoses    Atrial fibrillation, unspecified type [facility])    -  Primary   Relevant Orders   Ambulatory referral to Cardiology      Follow-up: No follow-ups on file.  Ann Held, DO

## 2019-11-09 NOTE — Assessment & Plan Note (Signed)
Recheck labs 

## 2019-11-09 NOTE — Assessment & Plan Note (Signed)
Suspected ----- event monitor ordered  Refer to cardiology

## 2019-11-09 NOTE — Patient Instructions (Signed)

## 2019-11-10 LAB — COMPREHENSIVE METABOLIC PANEL
AG Ratio: 1.4 (calc) (ref 1.0–2.5)
ALT: 20 U/L (ref 6–29)
AST: 32 U/L (ref 10–35)
Albumin: 3.3 g/dL — ABNORMAL LOW (ref 3.6–5.1)
Alkaline phosphatase (APISO): 98 U/L (ref 37–153)
BUN/Creatinine Ratio: 14 (calc) (ref 6–22)
BUN: 14 mg/dL (ref 7–25)
CO2: 25 mmol/L (ref 20–32)
Calcium: 9.2 mg/dL (ref 8.6–10.4)
Chloride: 100 mmol/L (ref 98–110)
Creat: 1.02 mg/dL — ABNORMAL HIGH (ref 0.60–0.88)
Globulin: 2.4 g/dL (calc) (ref 1.9–3.7)
Glucose, Bld: 357 mg/dL — ABNORMAL HIGH (ref 65–99)
Potassium: 3.6 mmol/L (ref 3.5–5.3)
Sodium: 136 mmol/L (ref 135–146)
Total Bilirubin: 1.7 mg/dL — ABNORMAL HIGH (ref 0.2–1.2)
Total Protein: 5.7 g/dL — ABNORMAL LOW (ref 6.1–8.1)

## 2019-11-10 LAB — CBC WITH DIFFERENTIAL/PLATELET
Absolute Monocytes: 365 cells/uL (ref 200–950)
Basophils Absolute: 18 cells/uL (ref 0–200)
Basophils Relative: 0.4 %
Eosinophils Absolute: 140 cells/uL (ref 15–500)
Eosinophils Relative: 3.1 %
HCT: 32.6 % — ABNORMAL LOW (ref 35.0–45.0)
Hemoglobin: 9.8 g/dL — ABNORMAL LOW (ref 11.7–15.5)
Lymphs Abs: 554 cells/uL — ABNORMAL LOW (ref 850–3900)
MCH: 25.5 pg — ABNORMAL LOW (ref 27.0–33.0)
MCHC: 30.1 g/dL — ABNORMAL LOW (ref 32.0–36.0)
MCV: 84.7 fL (ref 80.0–100.0)
Monocytes Relative: 8.1 %
Neutro Abs: 3425 cells/uL (ref 1500–7800)
Neutrophils Relative %: 76.1 %
Platelets: 65 10*3/uL — ABNORMAL LOW (ref 140–400)
RBC: 3.85 10*6/uL (ref 3.80–5.10)
RDW: 14.3 % (ref 11.0–15.0)
Total Lymphocyte: 12.3 %
WBC: 4.5 10*3/uL (ref 3.8–10.8)

## 2019-11-10 LAB — LIPID PANEL
Cholesterol: 112 mg/dL (ref <200)
HDL: 46 mg/dL — ABNORMAL LOW (ref >=50)
LDL Cholesterol (Calc): 52 mg/dL (calc)
Non-HDL Cholesterol (Calc): 66 mg/dL (calc) (ref <130)
Total CHOL/HDL Ratio: 2.4 (calc) (ref <5.0)
Triglycerides: 61 mg/dL (ref <150)

## 2019-11-10 LAB — TSH: TSH: 2.65 mIU/L (ref 0.40–4.50)

## 2019-11-11 ENCOUNTER — Telehealth: Payer: Self-pay | Admitting: Family Medicine

## 2019-11-11 NOTE — Telephone Encounter (Signed)
Caller Sharee Pimple  Call [facility] [phone]  Per patient's daughter, patient was suppose to get a prescription for gabapentin sent to [facility]. No prescription sent in per patient daughter. Pt is also concerned about her  lab results , patient would like to know if she should stay on spironolactone , of stop the medication ? Patient only has one pill left    Please advise

## 2019-11-11 NOTE — Telephone Encounter (Signed)
Please advise on the spironolactone.

## 2019-11-12 NOTE — Telephone Encounter (Signed)
Call: Sharee Pimple  Call Back [phone]  Patient's daughter requesting status of recent message in reference to blood pressure medication

## 2019-11-14 ENCOUNTER — Encounter (HOSPITAL_BASED_OUTPATIENT_CLINIC_OR_DEPARTMENT_OTHER): Payer: Self-pay

## 2019-11-14 ENCOUNTER — Emergency Department (HOSPITAL_BASED_OUTPATIENT_CLINIC_OR_DEPARTMENT_OTHER)
Admission: EM | Admit: 2019-11-14 | Discharge: 2019-11-14 | Disposition: A | Discharge status: Home / Self Care | Payer: HMO | Attending: Emergency Medicine | Admitting: Emergency Medicine

## 2019-11-14 ENCOUNTER — Other Ambulatory Visit: Payer: Self-pay

## 2019-11-14 ENCOUNTER — Emergency Department (HOSPITAL_BASED_OUTPATIENT_CLINIC_OR_DEPARTMENT_OTHER): Payer: HMO

## 2019-11-14 DIAGNOSIS — R600 Localized edema: Secondary | ICD-10-CM | POA: Insufficient documentation

## 2019-11-14 DIAGNOSIS — Z79890 Hormone replacement therapy (postmenopausal): Secondary | ICD-10-CM | POA: Diagnosis not present

## 2019-11-14 DIAGNOSIS — Z20822 Contact with and (suspected) exposure to covid-19: Secondary | ICD-10-CM | POA: Diagnosis not present

## 2019-11-14 DIAGNOSIS — C4491 Basal cell carcinoma of skin, unspecified: Secondary | ICD-10-CM | POA: Diagnosis not present

## 2019-11-14 DIAGNOSIS — J189 Pneumonia, unspecified organism: Secondary | ICD-10-CM | POA: Insufficient documentation

## 2019-11-14 DIAGNOSIS — E1165 Type 2 diabetes mellitus with hyperglycemia: Secondary | ICD-10-CM | POA: Insufficient documentation

## 2019-11-14 DIAGNOSIS — I10 Essential (primary) hypertension: Secondary | ICD-10-CM | POA: Insufficient documentation

## 2019-11-14 DIAGNOSIS — E785 Hyperlipidemia, unspecified: Secondary | ICD-10-CM | POA: Diagnosis not present

## 2019-11-14 DIAGNOSIS — E039 Hypothyroidism, unspecified: Secondary | ICD-10-CM | POA: Insufficient documentation

## 2019-11-14 DIAGNOSIS — R0989 Other specified symptoms and signs involving the circulatory and respiratory systems: Secondary | ICD-10-CM | POA: Insufficient documentation

## 2019-11-14 DIAGNOSIS — Z96649 Presence of unspecified artificial hip joint: Secondary | ICD-10-CM | POA: Insufficient documentation

## 2019-11-14 DIAGNOSIS — K766 Portal hypertension: Secondary | ICD-10-CM | POA: Insufficient documentation

## 2019-11-14 DIAGNOSIS — R059 Cough, unspecified: Secondary | ICD-10-CM | POA: Diagnosis present

## 2019-11-14 DIAGNOSIS — Y95 Nosocomial condition: Secondary | ICD-10-CM

## 2019-11-14 DIAGNOSIS — Z79899 Other long term (current) drug therapy: Secondary | ICD-10-CM | POA: Diagnosis not present

## 2019-11-14 DIAGNOSIS — Z794 Long term (current) use of insulin: Secondary | ICD-10-CM | POA: Diagnosis not present

## 2019-11-14 LAB — COMPREHENSIVE METABOLIC PANEL
ALT: 22 U/L (ref 0–44)
AST: 39 U/L (ref 15–41)
Albumin: 2.9 g/dL — ABNORMAL LOW (ref 3.5–5.0)
Alkaline Phosphatase: 80 U/L (ref 38–126)
Anion gap: 8 (ref 5–15)
BUN: 16 mg/dL (ref 8–23)
CO2: 24 mmol/L (ref 22–32)
Calcium: 8.8 mg/dL — ABNORMAL LOW (ref 8.9–10.3)
Chloride: 100 mmol/L (ref 98–111)
Creatinine, Ser: 1.09 mg/dL — ABNORMAL HIGH (ref 0.44–1.00)
GFR, Estimated: 50 mL/min — ABNORMAL LOW (ref >60)
Glucose, Bld: 381 mg/dL — ABNORMAL HIGH (ref 70–99)
Potassium: 4.3 mmol/L (ref 3.5–5.1)
Sodium: 132 mmol/L — ABNORMAL LOW (ref 135–145)
Total Bilirubin: 1.3 mg/dL — ABNORMAL HIGH (ref 0.3–1.2)
Total Protein: 5.6 g/dL — ABNORMAL LOW (ref 6.5–8.1)

## 2019-11-14 LAB — CBC WITH DIFFERENTIAL/PLATELET
Abs Immature Granulocytes: 0.02 10*3/uL (ref 0.00–0.07)
Basophils Absolute: 0 10*3/uL (ref 0.0–0.1)
Basophils Relative: 0 %
Eosinophils Absolute: 0.1 10*3/uL (ref 0.0–0.5)
Eosinophils Relative: 3 %
HCT: 30.5 % — ABNORMAL LOW (ref 36.0–46.0)
Hemoglobin: 9 g/dL — ABNORMAL LOW (ref 12.0–15.0)
Immature Granulocytes: 1 %
Lymphocytes Relative: 14 %
Lymphs Abs: 0.5 10*3/uL — ABNORMAL LOW (ref 0.7–4.0)
MCH: 24.6 pg — ABNORMAL LOW (ref 26.0–34.0)
MCHC: 29.5 g/dL — ABNORMAL LOW (ref 30.0–36.0)
MCV: 83.3 fL (ref 80.0–100.0)
Monocytes Absolute: 0.3 10*3/uL (ref 0.1–1.0)
Monocytes Relative: 9 %
Neutro Abs: 2.7 10*3/uL (ref 1.7–7.7)
Neutrophils Relative %: 73 %
Platelets: 53 10*3/uL — ABNORMAL LOW (ref 150–400)
RBC: 3.66 MIL/uL — ABNORMAL LOW (ref 3.87–5.11)
RDW: 15.6 % — ABNORMAL HIGH (ref 11.5–15.5)
Smear Review: DECREASED
WBC: 3.7 10*3/uL — ABNORMAL LOW (ref 4.0–10.5)
nRBC: 0 % (ref 0.0–0.2)

## 2019-11-14 LAB — RESP PANEL BY RT PCR (RSV, FLU A&B, COVID)
Influenza A by PCR: NEGATIVE
Influenza B by PCR: NEGATIVE
Respiratory Syncytial Virus by PCR: NEGATIVE
SARS Coronavirus 2 by RT PCR: NEGATIVE

## 2019-11-14 LAB — BRAIN NATRIURETIC PEPTIDE: B Natriuretic Peptide: 112 pg/mL — ABNORMAL HIGH (ref 0.0–100.0)

## 2019-11-14 MED ORDER — DOXYCYCLINE HYCLATE 100 MG PO CAPS: 100.0000 mg | ORAL_CAPSULE | Freq: Two times a day (BID) | ORAL | 0 refills | Status: DC

## 2019-11-14 NOTE — ED Triage Notes (Signed)
Pt was seen on Monday for a post hospitalization visit and was given a z-pack which was finished yesterday for chest congestion and cough. Pt has had a cough since a week ago that has since gotten worse along with the chest congestion. Pt denies pain.

## 2019-11-14 NOTE — ED Notes (Signed)
ED Provider at bedside. 

## 2019-11-14 NOTE — ED Notes (Signed)
PA aware of pt flagging for sepsis. Order set not placed per verbal from PA

## 2019-11-14 NOTE — ED Provider Notes (Signed)
[facility] EMERGENCY DEPARTMENT Provider Note   CSN: [id number] Arrival date & time: 11/14/19  1044     History Chief Complaint  Patient presents with  . Cough    Jessica Dennis is a 86 y.o. female with PMH of type II DM, HTN, HLD, alcoholic cirrhosis with thrombocytopenia, hypothyroidism on Synthroid, and recent hospital admission for increasing frequency of falls who presents to the ED with complaints of progressively worsening chest congestion and cough since discharge from the hospital 10/20/2019.  I reviewed patient's medical record and shortly after discharge, she was evaluated by Dr. Carollee Herter, her primary care provider, and prescribed a Z-Pak after plain films obtained 11/09/2019 revealed a small to moderate right-sided pleural effusion with associated airspace disease.  On my examination, she is accompanied by her daughter at bedside, an Therapist, sports.  Evidently patient's symptoms began on 11/07/2019, approximately 1 week after she was discharged home from the SNF.  Her symptoms seem to improve mildly with the Z-Pak, but have since returned prompting their concern.  Her cough is nonproductive.  No hemoptysis.  She denies any chest pain or shortness of breath.  No history of clots or clotting disorder, but daughter does suggest that she has not been particularly mobile since her recent hospitalization.  She has edema in her legs bilaterally for which she elevates and uses compression stockings.  She takes 20 mg of furosemide daily.  She is on metoprolol, however daughter notes that her heart rate has been elevated beyond her typical 110s.  Patient endorses intermittent alcohol use, but denies any obvious aspirations.  She denies any weight gain, worsening edema, unilateral extremity swelling, history of clots or clotting disorder, pleuritic symptoms, chest pain, difficulty breathing, exertional dyspnea, fevers or chills, syncope, or other symptoms.  T-max at home 99.3 F.  Patient has  been fully immunized for COVID-19.  HPI     Past Medical History:  Diagnosis Date  . Anemia   . Angiodysplasia of ascending colon 10/25/2014  . Anxiety   . Arthritis   . Borderline diabetes   . Cancer of the skin, basal cell 09/03/2012  . Cirrhosis ([place])   . Diabetes mellitus without complication ([place])   . Diverticular disease   . 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 GERD (gastroesophageal reflux disease)   . Hypertension   . Iron deficiency anemia due to chronic blood loss 01/19/2015  . Portal hypertensive gastropathy ([place]) 08/02/2016   ? Some GAVE also  . Thyroid disease     Patient Active Problem List   Diagnosis Date Noted  . Chronic atrial fibrillation ([place]) 11/09/2019  . Shortness of breath 11/09/2019  . Multiple falls   . Hypovolemia 10/17/2019  . Acute lower UTI 10/17/2019  . Rhabdomyolysis 10/17/2019  . AKI (acute kidney injury) ([place]) 10/16/2019  . Hyperlipidemia associated with type 2 diabetes mellitus ([place]) 10/27/2018  . Generalized anxiety disorder 10/27/2018  . Open wound, lower leg, left, initial encounter 10/27/2018  . Tremor of right hand 10/27/2018  . 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 Alcoholic cirrhosis of liver with ascites ([place]) 08/02/2016  . Portal hypertensive gastropathy ([place]) 08/02/2016  . Iron deficiency anemia due to chronic blood loss 01/19/2015  . Occult GI bleeding 01/19/2015  . Angiodysplasia of ascending colon 10/25/2014  . H/O bone density study 04/27/2013  . Type 2 diabetes mellitus with hyperglycemia, with long-term current use of insulin ([place]) 03/24/2013  . Vaginal dryness, menopausal 01/20/2013  . Atrophic vaginitis 01/20/2013  .  S/P hysterectomy 01/20/2013  . Cervical nerve root disorder 11/18/2012  . Shoulder weakness 11/18/2012  . Cancer of the skin, basal cell 09/03/2012  . Squamous cell skin cancer 09/03/2012  . Thrombocytopenia ([place]) 08/14/2012  . Lymphadenopathy of right cervical region  04/01/2012  . Hyperglycemia 09/19/2011  . Heel pain 08/06/2011  . Plantar fasciitis 08/06/2011  . Arthritis of knee, degenerative 08/06/2011  . Gonalgia 08/06/2011  . History of anemia 06/28/2011  . Anxiety 06/28/2011  . Essential hypertension, benign 06/28/2011  . GERD (gastroesophageal reflux disease) 06/28/2011  . Hyperlipidemia LDL goal <100 06/28/2011  . History of hysterectomy 06/28/2011  . Menopause 06/28/2011  . Hypothyroidism 06/28/2011  . Glaucoma 06/28/2011  . DJD (degenerative joint disease) 06/28/2011  . Essential hypertension 04/27/2010  . Acid reflux 10/28/2009  . Absolute anemia 05/24/2009  . Anemia, iron deficiency 05/24/2009  . Adult hypothyroidism 11/22/2008  . Diverticular disease of large intestine 05/15/2007  . Elevated fasting blood sugar 05/15/2007  . Psoriasis 05/15/2007  . Menopausal symptom 05/15/2007    Past Surgical History:  Procedure Laterality Date  . ABDOMINAL HYSTERECTOMY    . APPENDECTOMY    . COLONOSCOPY    . ESOPHAGOGASTRODUODENOSCOPY    . IR PARACENTESIS  05/25/2016  . LEG SKIN LESION  BIOPSY / EXCISION  12/11/14  . MOHS SURGERY     ankle  . TONSILLECTOMY    . TOTAL HIP ARTHROPLASTY Bilateral 1993, 2006  . UPPER GASTROINTESTINAL ENDOSCOPY  09/09/2017     OB History    Gravida  2   Para  2   Term      Preterm      AB      Living  2     SAB      TAB      Ectopic      Multiple      Live Births              Family History  Problem Relation Age of Onset  . Bladder Cancer Father   . Heart attack Father   . Diabetes Mother   . Colon cancer Neg Hx   . Colon polyps Neg Hx   . Esophageal cancer Neg Hx   . Rectal cancer Neg Hx   . Stomach cancer Neg Hx     Social History   Tobacco Use  . Smoking status: Never Smoker  . Smokeless tobacco: Never Used  . Tobacco comment: NEVER USED TOBACCO  Vaping Use  . Vaping Use: Never used  Substance Use Topics  . Alcohol use: Yes    Alcohol/week: 2.0 standard  drinks    Types: 2 Standard drinks or equivalent per week    Comment: rare occassion  . Drug use: No    Home Medications Prior to Admission medications   Medication Sig Start Date End Date Taking? Authorizing Provider  Accu-Chek FastClix Lancets MISC CHECK FOR BLOOD SUGAR DAILY 11/12/18   Carollee Herter, Alferd Apa, DO  alclomethasone (ACLOVATE) 0.05 % cream Apply 1 application topically daily as needed. 10/14/19   [provider]  ammonium lactate (AMLACTIN) 12 % cream Apply topically daily as needed.  05/20/18   [provider]  Apoaequorin (PREVAGEN PO) Take 1 tablet by mouth daily.    [provider]  Ascorbic Acid (VITAMIN C) 1000 MG tablet Take 1,000 mg by mouth daily.    [provider]  azithromycin (ZITHROMAX Z-PAK) 250 MG tablet As directed 11/09/19   Carollee Herter, Alferd Apa, DO  CALCIUM CARBONATE-VITAMIN D PO Take by mouth. Calcium 1200mg   Vitamin D 5000 units  Pt also has a calcium 600mg  plus 800 units vitamin D supplement    [provider]  doxycycline (VIBRAMYCIN) 100 MG capsule Take 1 capsule (100 mg total) by mouth 2 (two) times daily. 11/14/19   Corena Herter, PA-C  furosemide (LASIX) 20 MG tablet Take 1 tablet (20 mg total) by mouth 2 (two) times daily. 11/09/19   Ann Held, DO  gabapentin (NEURONTIN) 100 MG capsule Take 1 capsule (100 mg total) by mouth 2 (two) times daily. 11/09/19   Ann Held, DO  gentamicin ointment (GARAMYCIN) 0.1 % Apply 1 application topically as needed.  06/04/19   [provider]  Insulin Aspart Prot & Aspart (NOVOLOG MIX 70/30 [place]) Inject into the skin. 35 units in the mornings and 25 at night    [provider]  latanoprost (XALATAN) 0.005 % ophthalmic solution Place 1 drop into both eyes at bedtime.  08/19/12   [provider]  levothyroxine (SYNTHROID) 75 MCG tablet Take 1 tablet (75 mcg total) by mouth daily. 11/09/19   Ann Held, DO  metFORMIN  (GLUCOPHAGE-XR) 500 MG 24 hr tablet Take 1 tablet (500 mg total) by mouth daily. 11/02/19   Ann Held, DO  metoprolol succinate (TOPROL-XL) 25 MG 24 hr tablet Take 0.5 tablets (12.5 mg total) by mouth daily. 11/04/19 12/04/19  Ann Held, DO  pantoprazole (PROTONIX) 40 MG tablet Take 1 tablet (40 mg total) by mouth daily. 07/24/19   Gatha Mayer, MD  Prednicarbate 0.1 % CREA Apply topically 2 (two) times daily.     [provider]  simvastatin (ZOCOR) 20 MG tablet Take 1 tablet (20 mg total) by mouth at bedtime. 11/09/19   Lowne Chase, Yvonne R, DO  SSD 1 % cream 1 application daily.  02/06/18   [provider]  vitamin E 180 MG (400 UNITS) capsule Take by mouth daily.     [provider]    Allergies    Tizanidine  Review of Systems   Review of Systems  All other systems reviewed and are negative.   Physical Exam Updated Vital Signs BP 106/63   Pulse (!) 103   Temp 98.2 F (36.8 C)   Resp 19   Ht 5\' 7"  (1.702 m)   Wt 84.4 kg   SpO2 95%   BMI 29.13 kg/m   Physical Exam Vitals and nursing note reviewed. Exam conducted with a chaperone present.  HENT:     Head: Normocephalic and atraumatic.  Eyes:     General: No scleral icterus.    Conjunctiva/sclera: Conjunctivae normal.  Cardiovascular:     Rate and Rhythm: Regular rhythm. Tachycardia present.     Pulses: Normal pulses.     Heart sounds: Normal heart sounds.  Pulmonary:     Effort: Pulmonary effort is normal. No respiratory distress.     Breath sounds: Normal breath sounds.     Comments: No significant increased work of breathing.  Decreased breath sounds in right lower lobe.  Mildly tachypneic, but no distress.  No excessive muscle use.  Symmetric chest rise. Abdominal:     General: Abdomen is flat. There is no distension.     Palpations: Abdomen is soft.     Tenderness: There is no abdominal tenderness.     Comments: No significant distention despite alcoholic  cirrhosis that has previously required large-volume paracentesis.  No tenderness.  No overlying skin changes.  Musculoskeletal:     Comments: 2+ pitting edema in lower legs bilaterally.  Symmetric.  Pedal pulses intact.  Sensation diminished (chronic) but grossly intact.  Skin:    General: Skin is dry.     Capillary Refill: Capillary refill takes less than 2 seconds.  Neurological:     Mental Status: She is alert.     GCS: GCS eye subscore is 4. GCS verbal subscore is 5. GCS motor subscore is 6.  Psychiatric:        Mood and Affect: Mood normal.        Behavior: Behavior normal.        Thought Content: Thought content normal.     ED Results / Procedures / Treatments   Labs (all labs ordered are listed, but only abnormal results are displayed) Labs Reviewed  CBC WITH DIFFERENTIAL/PLATELET - Abnormal; Notable for the following components:      Result Value   WBC 3.7 (*)    RBC 3.66 (*)    Hemoglobin 9.0 (*)    HCT 30.5 (*)    MCH 24.6 (*)    MCHC 29.5 (*)    RDW 15.6 (*)    Platelets 53 (*)    Lymphs Abs 0.5 (*)    All other components within normal limits  COMPREHENSIVE METABOLIC PANEL - Abnormal; Notable for the following components:   Sodium 132 (*)    Glucose, Bld 381 (*)    Creatinine, Ser 1.09 (*)    Calcium 8.8 (*)    Total Protein 5.6 (*)    Albumin 2.9 (*)    Total Bilirubin 1.3 (*)    GFR, Estimated 50 (*)    All other components within normal limits  BRAIN NATRIURETIC PEPTIDE - Abnormal; Notable for the following components:   B Natriuretic Peptide 112.0 (*)    All other components within normal limits  RESP PANEL BY RT PCR (RSV, FLU A&B, COVID)    EKG EKG Interpretation  Date/Time:  Saturday November 14 2019 11:31:10 EDT Ventricular Rate:  104 PR Interval:    QRS Duration: 80 QT Interval:  347 QTC Calculation: 457 R Axis:   10 Text Interpretation: Sinus or ectopic atrial tachycardia Low voltage, precordial leads Abnormal R-wave progression, early  transition Baseline wander in lead(s) V6 No STEMI Confirmed by Nanda Quinton [phone]) on 11/14/2019 12:35:12 PM   Radiology DG Chest 2 View  Result Date: 11/14/2019 CLINICAL DATA:  Cough. EXAM: CHEST - 2 VIEW COMPARISON:  November 09, 2019. FINDINGS: The heart size and mediastinal contours are within normal limits. No pneumothorax is noted. Left lung is clear. Right pleural effusion is significantly smaller compared to prior exam. Right basilar atelectasis or infiltrate is noted. The visualized skeletal structures are unremarkable. IMPRESSION: Right pleural effusion is significantly smaller compared to prior exam. Right basilar atelectasis or infiltrate is noted. Electronically Signed   By: Marijo Conception M.D.   On: 11/14/2019 12:00    Procedures Procedures (including critical care time)  Medications Ordered in ED Medications - No data to display  ED Course  I have reviewed the triage vital signs and the nursing notes.  Pertinent labs & imaging results that were available during my care of the patient were reviewed by me and considered in my medical decision making (see chart for details).    MDM Rules/Calculators/A&P  Given patient's significant comorbidities, feel as patient likely would have benefited from Augmentin, doxycycline, or a cephalosporin in addition to her macrolide coverage.  She is inside 90 days since hospital/SNF admission and this is to be treated as HAP.  DG chest is personally reviewed and demonstrates a significantly small right-sided pleural effusion compared to imaging obtained last week.  The atelectasis versus infiltrate is persistent.  Given that her symptoms of nonproductive cough and chest congestion has continued to persist despite macrolide antibiotics, discussed case with Dr. Laverta Baltimore will prescribe doxycycline for additional coverage.  Laboratory work-up is reviewed and largely unremarkable.  Consistent with baseline.  She is in no acute  distress on my exam and is demonstrating no increased work of breathing.  She denies any chest pain.  Do not feel as though further work-up is warranted.  Lower suspicion for pulmonary embolism at this time given lack of any pleuritic symptoms, chest pain, unilateral extremity swelling or edema, or reported shortness of breath.  She denies any dyspnea on exertion and states that she walks 3/10 of a mile per day.  Strict ED return precautions discussed with patient and daughter.  All of the evaluation and work-up results were discussed with the patient and any family at bedside.  Patient and/or family were informed that while patient is appropriate for discharge at this time, some medical emergencies may only develop or become detectable after a period of time.  I specifically instructed patient and/or family to return to return to the ED or seek immediate medical attention for any new or worsening symptoms.  They were provided opportunity to ask any additional questions and have none at this time.  Prior to discharge patient is feeling well, agreeable with plan for discharge home.  They have expressed understanding of verbal discharge instructions as well as return precautions and are agreeable to the plan.    Final Clinical Impression(s) / ED Diagnoses Final diagnoses:  HAP (hospital-acquired pneumonia)    Rx / DC Orders ED Discharge Orders         Ordered    doxycycline (VIBRAMYCIN) 100 MG capsule  2 times daily        11/14/19 1316           Reita Chard 11/14/19 1316    Long, Wonda Olds, MD 11/15/19 [phone]

## 2019-11-14 NOTE — Discharge Instructions (Signed)
Please take doxycycline, as directed.  Take with food.  Please follow-up with your primary care provider for ongoing evaluation and management if your symptoms fail to improve.  We did not get a CTA of your chest to evaluate for pulmonary embolism.  Suspect that this is related to a persistent hospital-acquired pneumonia.  However, if you develop any shortness of breath, chest pain, or any other new or worsening symptoms, please return to the ED or seek immediate medical attention.

## 2019-11-16 ENCOUNTER — Other Ambulatory Visit: Payer: Self-pay | Admitting: Family Medicine

## 2019-11-16 ENCOUNTER — Telehealth: Payer: Self-pay | Admitting: Family Medicine

## 2019-11-16 DIAGNOSIS — R252 Cramp and spasm: Secondary | ICD-10-CM

## 2019-11-16 MED ORDER — GABAPENTIN 100 MG PO CAPS: 100.0000 mg | ORAL_CAPSULE | Freq: Two times a day (BID) | ORAL | 1 refills | Status: DC

## 2019-11-16 NOTE — Telephone Encounter (Signed)
You only sent in 8 on the last refill? Please advise for more capsules

## 2019-11-16 NOTE — Telephone Encounter (Signed)
Rx sent 

## 2019-11-16 NOTE — Telephone Encounter (Signed)
gabapentin (NEURONTIN) 100 MG capsule [[id number]   Please resend to the [facility] on [address] in [place] did not receive first one

## 2019-11-23 ENCOUNTER — Telehealth: Payer: HMO

## 2019-11-25 ENCOUNTER — Other Ambulatory Visit: Payer: Self-pay

## 2019-11-27 ENCOUNTER — Ambulatory Visit (INDEPENDENT_AMBULATORY_CARE_PROVIDER_SITE_OTHER): Payer: HMO

## 2019-11-27 ENCOUNTER — Ambulatory Visit: Payer: HMO | Admitting: Cardiology

## 2019-11-27 ENCOUNTER — Other Ambulatory Visit: Payer: Self-pay

## 2019-11-27 ENCOUNTER — Encounter: Payer: Self-pay | Admitting: Cardiology

## 2019-11-27 ENCOUNTER — Ambulatory Visit (HOSPITAL_BASED_OUTPATIENT_CLINIC_OR_DEPARTMENT_OTHER)
Admission: RE | Admit: 2019-11-27 | Discharge: 2019-11-27 | Disposition: A | Discharge status: Home / Self Care | Payer: HMO | Source: Ambulatory Visit | Attending: Family Medicine | Admitting: Family Medicine

## 2019-11-27 VITALS — BP 128/60 | HR 99 | Ht 67.0 in | Wt 183.0 lb

## 2019-11-27 DIAGNOSIS — R9431 Abnormal electrocardiogram [ECG] [EKG]: Secondary | ICD-10-CM | POA: Diagnosis not present

## 2019-11-27 DIAGNOSIS — E119 Type 2 diabetes mellitus without complications: Secondary | ICD-10-CM

## 2019-11-27 DIAGNOSIS — R42 Dizziness and giddiness: Secondary | ICD-10-CM

## 2019-11-27 DIAGNOSIS — R0602 Shortness of breath: Secondary | ICD-10-CM

## 2019-11-27 DIAGNOSIS — I10 Essential (primary) hypertension: Secondary | ICD-10-CM | POA: Insufficient documentation

## 2019-11-27 DIAGNOSIS — Z794 Long term (current) use of insulin: Secondary | ICD-10-CM | POA: Insufficient documentation

## 2019-11-27 DIAGNOSIS — R600 Localized edema: Secondary | ICD-10-CM

## 2019-11-27 LAB — ECHOCARDIOGRAM COMPLETE
Area-P 1/2: 2.71 cm2
Height: 67 in
S' Lateral: 2.07 cm
Weight: 2928 oz

## 2019-11-27 MED ORDER — SPIRONOLACTONE 25 MG PO TABS: 12.5000 mg | ORAL_TABLET | Freq: Every day | ORAL | 1 refills | Status: DC

## 2019-11-27 NOTE — Patient Instructions (Addendum)
Medication Instructions:  Your physician has recommended you make the following change in your medication:  -- START Spironolactone 12.5 mg - Take 0.5 tablet (12.5 mg) by mouth daily -- NEW RX Sent *If you need a refill on your cardiac medications before your next appointment, please call your pharmacy*  Lab Work: Your physician recommends that you return for lab work in: [place] for a BMET and Magnesium Level If you have labs (blood work) drawn today and your tests are completely normal, you will receive your results only by: Marland Kitchen MyChart Message (if you have MyChart) OR . A paper copy in the mail If you have any lab test that is abnormal or we need to change your treatment, we will call you to review the results.  Testing/Procedures: Your physician has requested that you have a lexiscan myoview. For further information please visit [email or web address]. Please follow instruction sheet, as given.  Follow-Up: At [facility], you and your health needs are our priority.  As part of our continuing mission to provide you with exceptional heart care, we have created designated Provider Care Teams.  These Care Teams include your primary Cardiologist (physician) and Advanced Practice Providers (APPs -  Physician Assistants and Nurse Practitioners) who all work together to provide you with the care you need, when you need it.  We recommend signing up for the patient portal called "MyChart".  Sign up information is provided on this After Visit Summary.  MyChart is used to connect with patients for Virtual Visits (Telemedicine).  Patients are able to view lab/test results, encounter notes, upcoming appointments, etc.  Non-urgent messages can be sent to your provider as well.   To learn more about what you can do with MyChart, go to [email or web address].    Your next appointment:   Your physician recommends that you schedule a follow-up appointment in: 3 MONTHS with Dr. Harriet Masson.  The format for your next  appointment:   In Person with Berniece Salines, DO

## 2019-11-27 NOTE — Progress Notes (Signed)
Cardiology Office Note:    Date:  11/27/2019   ID:  Ammara Raj, DOB 08-18-1932, MRN [id number]  PCP:  Ann Held, DO  Cardiologist:  No primary care provider on file.  Electrophysiologist:  None   Referring MD: Carollee Herter, Alferd Apa, *   I to have some leg swelling  History of Present Illness:    Jessica Dennis is a 86 y.o. female with a hx of alcoholic liver cirrhosis, diabetes mellitus, hypertension, portal hypertensive gastropathy presents today to be evaluated for bilateral leg edema in patient about atrial fibrillation.  The patient was referred by her PCP.  Per the patient her daughter-in-law was present for her visit tells me that the patient was admitted at the [facility] in September due to multiple falls.  During hospitalization there was concern that she may have had atrial fibrillation however there is no documented EKG of sort in her records.  She was started on metoprolol and her Lasix at the time of that.  She then saw her PCP who restarted her on her Lasix due to significant leg edema. She also had symptoms of pneumonia she was first placed on a Z-Pak which did not help the patient was seen in the emergency department and she was placed on doxycycline and her symptoms has improved significantly. Today she tells me she does have some shortness of breath but also the leg edema is persistent.  No other complaints at this time.  Past Medical History:  Diagnosis Date  . Anemia   . Angiodysplasia of ascending colon 10/25/2014  . Anxiety   . Arthritis   . Borderline diabetes   . Cancer of the skin, basal cell 09/03/2012  . Cirrhosis ([place])   . Diabetes mellitus without complication ([place])   . Diverticular disease   . 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 GERD (gastroesophageal reflux disease)   . Hypertension   . Iron deficiency anemia due to chronic blood loss 01/19/2015  . Portal hypertensive gastropathy ([place]) 08/02/2016   ? Some GAVE  also  . Thyroid disease     Past Surgical History:  Procedure Laterality Date  . ABDOMINAL HYSTERECTOMY    . APPENDECTOMY    . COLONOSCOPY    . ESOPHAGOGASTRODUODENOSCOPY    . IR PARACENTESIS  05/25/2016  . LEG SKIN LESION  BIOPSY / EXCISION  12/11/14  . MOHS SURGERY     ankle  . TONSILLECTOMY    . TOTAL HIP ARTHROPLASTY Bilateral 1993, 2006  . UPPER GASTROINTESTINAL ENDOSCOPY  09/09/2017    Current Medications: Current Meds  Medication Sig  . Accu-Chek FastClix Lancets MISC CHECK FOR BLOOD SUGAR DAILY  . alclomethasone (ACLOVATE) 0.05 % cream Apply 1 application topically daily as needed.  Marland Kitchen ammonium lactate (AMLACTIN) 12 % cream Apply topically daily as needed.   Marland Kitchen Apoaequorin (PREVAGEN PO) Take 1 tablet by mouth daily.  . Ascorbic Acid (VITAMIN C) 1000 MG tablet Take 1,000 mg by mouth daily.  Marland Kitchen CALCIUM CARBONATE-VITAMIN D PO Take by mouth. Calcium 1200mg   Vitamin D 5000 units  Pt also has a calcium 600mg  plus 800 units vitamin D supplement  . furosemide (LASIX) 20 MG tablet Take 1 tablet (20 mg total) by mouth 2 (two) times daily.  Marland Kitchen gentamicin ointment (GARAMYCIN) 0.1 % Apply 1 application topically as needed.   . Insulin Aspart Prot & Aspart (NOVOLOG MIX 70/30 [place]) Inject into the skin. 35 units in the mornings and 25 at night  .  latanoprost (XALATAN) 0.005 % ophthalmic solution Place 1 drop into both eyes at bedtime.   Marland Kitchen levothyroxine (SYNTHROID) 75 MCG tablet Take 1 tablet (75 mcg total) by mouth daily.  . metFORMIN (GLUCOPHAGE-XR) 500 MG 24 hr tablet Take 1 tablet (500 mg total) by mouth daily.  . metoprolol succinate (TOPROL-XL) 25 MG 24 hr tablet Take 0.5 tablets (12.5 mg total) by mouth daily.  . Omega-3 1000 MG CAPS Take 1,000 mg by mouth.  . pantoprazole (PROTONIX) 40 MG tablet Take 1 tablet (40 mg total) by mouth daily.  . Prednicarbate 0.1 % CREA Apply topically 2 (two) times daily.   . simvastatin (ZOCOR) 20 MG tablet Take 1 tablet (20 mg total) by mouth at  bedtime.  . SSD 1 % cream 1 application daily.   . vitamin E 180 MG (400 UNITS) capsule Take by mouth daily.      Allergies:   Tizanidine   Social History   Socioeconomic History  . Marital status: Divorced    Spouse name: Not on file  . Number of children: 2  . Years of education: Not on file  . Highest education level: Not on file  Occupational History  . Occupation: retired  Tobacco Use  . Smoking status: Never Smoker  . Smokeless tobacco: Never Used  . Tobacco comment: NEVER USED TOBACCO  Vaping Use  . Vaping Use: Never used  Substance and Sexual Activity  . Alcohol use: Yes    Alcohol/week: 2.0 standard drinks    Types: 2 Standard drinks or equivalent per week    Comment: rare occassion  . Drug use: No  . Sexual activity: Never  Other Topics Concern  . Not on file  Social History Narrative   The patient is divorced. She is originally from [place]. She has 2 sons. 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   08/10/2014   Social Determinants of Health   Financial Resource Strain:   . Difficulty of Paying Living Expenses: Not on file  Food Insecurity:   . Worried About Charity fundraiser in the Last Year: Not on file  . Ran Out of Food in the Last Year: Not on file  Transportation Needs:   . Lack of Transportation (Medical): Not on file  . Lack of Transportation (Non-Medical): Not on file  Physical Activity:   . Days of Exercise per Week: Not on file  . Minutes of Exercise per Session: Not on file  Stress:   . Feeling of Stress : Not on file  Social Connections:   . Frequency of Communication with Friends and Family: Not on file  . Frequency of Social Gatherings with Friends and Family: Not on file  . Attends Religious Services: Not on file  . Active Member of Clubs or Organizations: Not on file  . Attends Archivist Meetings: Not on file  . Marital Status: Not on file     Family History: The patient's family  history includes Bladder Cancer in her father; Diabetes in her mother; Heart attack in her father. There is no history of Colon cancer, Colon polyps, Esophageal cancer, Rectal cancer, or Stomach cancer.  ROS:   Review of Systems  Constitution: Negative for decreased appetite, fever and weight gain.  HENT: Negative for congestion, ear discharge, hoarse voice and sore throat.   Eyes: Negative for discharge, redness, vision loss in right eye and visual halos.  Cardiovascular: Reports shortness of breath and bilateral leg edema.  Negative  for chest pain,  leg swelling, orthopnea. Respiratory: Negative for cough, hemoptysis, shortness of breath and snoring.   Endocrine: Negative for heat intolerance and polyphagia.  Hematologic/Lymphatic: Negative for bleeding problem. Does not bruise/bleed easily.  Skin: Negative for flushing, nail changes, rash and suspicious lesions.  Musculoskeletal: Negative for arthritis, joint pain, muscle cramps, myalgias, neck pain and stiffness.  Gastrointestinal: Negative for abdominal pain, bowel incontinence, diarrhea and excessive appetite.  Genitourinary: Negative for decreased libido, genital sores and incomplete emptying.  Neurological: Negative for brief paralysis, focal weakness, headaches and loss of balance.  Psychiatric/Behavioral: Negative for altered mental status, depression and suicidal ideas.  Allergic/Immunologic: Negative for HIV exposure and persistent infections.    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  TTE Study Conclusions 2018  - Left ventricle: The cavity size was normal. There was mild focal  basal hypertrophy of the septum. Systolic function was vigorous.  The estimated ejection fraction was in the range of 65% to 70%.  Wall motion was normal; there were no  regional wall motion  abnormalities. Doppler parameters are consistent with abnormal  left ventricular relaxation (grade 1 diastolic dysfunction).  - Aortic valve: There was trivial regurgitation.  - Mitral valve: Calcified annulus.  - Pericardium, extracardiac: A trivial pericardial effusion was  identified.  Impressions: Vigorous LV systolic function; mild diastolic dysfunction; trace AI; there appears to be pleural effusion and possible ascites; suggest CT or MRI to further assess.   Recent Labs: 11/09/2019: TSH 2.65 11/14/2019: ALT 22; B Natriuretic Peptide 112.0; BUN 16; Creatinine, Ser 1.09; Hemoglobin 9.0; Platelets 53; Potassium 4.3; Sodium 132  Recent Lipid Panel    Component Value Date/Time   CHOL 112 11/09/2019 1137   TRIG 61 11/09/2019 1137   HDL 46 (L) 11/09/2019 1137   CHOLHDL 2.4 11/09/2019 1137   VLDL 14.2 04/28/2019 1139   LDLCALC 52 11/09/2019 1137    Physical Exam:    VS:  BP 128/60 (BP Location: Right Arm)   Pulse 99   Ht 5\' 7"  (1.702 m)   Wt 183 lb (83 kg)   SpO2 93%   BMI 28.66 kg/m     Wt Readings from Last 3 Encounters:  11/27/19 183 lb (83 kg)  11/14/19 186 lb (84.4 kg)  11/09/19 181 lb 9.6 oz (82.4 kg)     GEN: Well nourished, well developed in no acute distress HEENT: Normal NECK: No JVD; No carotid bruits LYMPHATICS: No lymphadenopathy CARDIAC: S1S2 noted,RRR, 2/6 systolic ejection murmurs, rubs, gallops RESPIRATORY:  Clear to auscultation without rales, wheezing or rhonchi  ABDOMEN: Soft, non-tender, non-distended, +bowel sounds, no guarding. EXTREMITIES: +2 bilateral leg edema, No cyanosis, no clubbing MUSCULOSKELETAL:  No deformity  SKIN: Warm and dry NEUROLOGIC:  Alert and oriented x 3, non-focal PSYCHIATRIC:  Normal affect, good insight  ASSESSMENT:    1. Lightheadedness   2. Nonspecific abnormal electrocardiogram (ECG) (EKG)   3. Abnormal electrocardiogram   4. Bilateral leg edema   5. Type 2 diabetes mellitus  without complication, with long-term current use of insulin ([place])   6. Primary hypertension    PLAN:    1.  She does have some leg edema which is concerning.  She is on Lasix according to the patient and her daughter-in-law she is on 20 mg daily.  In the setting of her alcohol cirrhosis would  not like to do is place the patient on Aldactone 12.5 mg and also keep her on the Lasix.  Her blood pressure in the office today is able to tolerate this change.  We will continue her low-dose metoprolol 12.5 mg.  I have discussed with the patient to keep an eye on her blood pressure if her systolic blood pressure comes down less than 100 mmHg to notify my office and we can rearrange her medicines.  2.  She does have mid ejection systolic murmur in has an echocardiogram scheduled for today.  We will send the patient information about her echocardiogram once this is done.  3.  In terms of her abnormal EKG she does have significant risk factors for coronary artery disease and along with her shortness of breath we will get a nuclear stress test in this patient.  Educated patient about the testing she is agreeable to proceed.  4.  There is questionable atrial fibrillation in this patient giving that she does have risk factors for developing atrial fibrillation I am going to place a monitor on the patient for 7 days.  This may cause establishing or not establishing a diagnosis and this patient for atrial fibrillation is very important as his affect treatment for stroke prevention.  The patient is in agreement with the above plan. The patient left the office in stable condition.  The patient will follow up in 3 months or sooner if needed.   Medication Adjustments/Labs and Tests Ordered: Current medicines are reviewed at length with the patient today.  Concerns regarding medicines are outlined above.  Orders Placed This Encounter  Procedures  . Basic Metabolic Panel (BMET)  . Magnesium  . MYOCARDIAL PERFUSION  IMAGING  . LONG TERM MONITOR (3-14 DAYS)  . EKG 12-Lead   Meds ordered this encounter  Medications  . spironolactone (ALDACTONE) 25 MG tablet    Sig: Take 0.5 tablets (12.5 mg total) by mouth daily.    Dispense:  45 tablet    Refill:  1    Patient Instructions  Medication Instructions:  Your physician has recommended you make the following change in your medication:  -- START Spironolactone 12.5 mg - Take 0.5 tablet (12.5 mg) by mouth daily -- NEW RX Sent *If you need a refill on your cardiac medications before your next appointment, please call your pharmacy*  Lab Work: Your physician recommends that you return for lab work in: [place] for a BMET and Magnesium Level If you have labs (blood work) drawn today and your tests are completely normal, you will receive your results only by: Marland Kitchen MyChart Message (if you have MyChart) OR . A paper copy in the mail If you have any lab test that is abnormal or we need to change your treatment, we will call you to review the results.  Testing/Procedures: Your physician has requested that you have a lexiscan myoview. For further information please visit [email or web address]. Please follow instruction sheet, as given.  Follow-Up: At [facility], you and your health needs are our priority.  As part of our continuing mission to provide you with exceptional heart care, we have created designated Provider Care Teams.  These Care Teams include your primary Cardiologist (physician) and Advanced Practice Providers (APPs -  Physician Assistants and Nurse Practitioners) who all work together to provide you with the care you need, when you need it.  We recommend signing up for the patient portal called "MyChart".  Sign up information is provided on this After Visit  Summary.  MyChart is used to connect with patients for Virtual Visits (Telemedicine).  Patients are able to view lab/test results, encounter notes, upcoming appointments, etc.  Non-urgent messages  can be sent to your provider as well.   To learn more about what you can do with MyChart, go to [email or web address].    Your next appointment:   Your physician recommends that you schedule a follow-up appointment in: 3 MONTHS with Dr. Harriet Masson.  The format for your next appointment:   In Person with Berniece Salines, DO        Adopting a Healthy Lifestyle.  Know what a healthy weight is for you (roughly BMI <25) and aim to maintain this   Aim for 7+ servings of fruits and vegetables daily   65-80+ fluid ounces of water or unsweet tea for healthy kidneys   Limit to max 1 drink of alcohol per day; avoid smoking/tobacco   Limit animal fats in diet for cholesterol and heart health - choose grass fed whenever available   Avoid highly processed foods, and foods high in saturated/trans fats   Aim for low stress - take time to unwind and care for your mental health   Aim for 150 min of moderate intensity exercise weekly for heart health, and weights twice weekly for bone health   Aim for 7-9 hours of sleep daily   When it comes to diets, agreement about the perfect plan isnt easy to find, even among the experts. Experts at the [place] developed an idea known as the Healthy Eating Plate. Just imagine a plate divided into logical, healthy portions.   The emphasis is on diet quality:   Load up on vegetables and fruits - one-half of your plate: Aim for color and variety, and remember that potatoes dont count.   Go for whole grains - one-quarter of your plate: Whole wheat, barley, wheat berries, quinoa, oats, brown rice, and foods made with them. If you want pasta, go with whole wheat pasta.   Protein power - one-quarter of your plate: Fish, chicken, beans, and nuts are all healthy, versatile protein sources. Limit red meat.   The diet, however, does go beyond the plate, offering a few other suggestions.   Use healthy plant oils, such as olive, canola, soy,  corn, sunflower and peanut. Check the labels, and avoid partially hydrogenated oil, which have unhealthy trans fats.   If youre thirsty, drink water. Coffee and tea are good in moderation, but skip sugary drinks and limit milk and dairy products to one or two daily servings.   The type of carbohydrate in the diet is more important than the amount. Some sources of carbohydrates, such as vegetables, fruits, whole grains, and beans-are healthier than others.   Finally, stay active  Signed, Berniece Salines, DO  11/27/2019 10:55 AM    [place]

## 2019-12-05 DIAGNOSIS — I484 Atypical atrial flutter: Secondary | ICD-10-CM

## 2019-12-05 DIAGNOSIS — R42 Dizziness and giddiness: Secondary | ICD-10-CM | POA: Diagnosis not present

## 2019-12-09 ENCOUNTER — Encounter (HOSPITAL_COMMUNITY): Payer: Self-pay

## 2019-12-09 ENCOUNTER — Telehealth (HOSPITAL_COMMUNITY): Payer: Self-pay

## 2019-12-09 NOTE — Telephone Encounter (Signed)
Left a message on the patient's families answering per DPR. Asked to call back to receive instructions for the patient. Jessica Dennis EMTP

## 2019-12-10 ENCOUNTER — Telehealth (HOSPITAL_COMMUNITY): Payer: Self-pay | Admitting: Radiology

## 2019-12-10 NOTE — Telephone Encounter (Signed)
Patient given detailed instructions per Myocardial Perfusion Study Information Sheet for the test on 11/22 at 10:15. Patient notified to arrive 15 minutes early and that it is imperative to arrive on time for appointment to keep from having the test rescheduled.  If you need to cancel or reschedule your appointment, please call the office within 24 hours of your appointment. . Patient verbalized understanding.EHK

## 2019-12-11 ENCOUNTER — Other Ambulatory Visit: Payer: Self-pay | Admitting: Family Medicine

## 2019-12-11 DIAGNOSIS — E1169 Type 2 diabetes mellitus with other specified complication: Secondary | ICD-10-CM

## 2019-12-11 DIAGNOSIS — R600 Localized edema: Secondary | ICD-10-CM

## 2019-12-14 ENCOUNTER — Other Ambulatory Visit: Payer: Self-pay | Admitting: Emergency Medicine

## 2019-12-14 ENCOUNTER — Telehealth: Payer: Self-pay | Admitting: *Deleted

## 2019-12-14 DIAGNOSIS — R42 Dizziness and giddiness: Secondary | ICD-10-CM

## 2019-12-14 DIAGNOSIS — R9431 Abnormal electrocardiogram [ECG] [EKG]: Secondary | ICD-10-CM

## 2019-12-14 NOTE — Telephone Encounter (Signed)
Caller Name [place] Phone Number [phone] Patient Name Jessica Dennis Patient DOB 05/15/1932 Call Type Message Only Information Provided Reason for Call Request for General Office Information Initial Comment Caller says that she requested a script for her mother in law yesterday and it is not at pharmacy. No Sx

## 2019-12-14 NOTE — Telephone Encounter (Signed)
Spoke with daughter and she stated that pt needed insulin which is over the counter.  They had already picked up.  I also notified her that pharmacy sent in to requests and we refilled them.

## 2019-12-15 ENCOUNTER — Telehealth: Payer: Self-pay

## 2019-12-15 ENCOUNTER — Ambulatory Visit (HOSPITAL_COMMUNITY): Payer: HMO | Attending: Cardiology

## 2019-12-15 ENCOUNTER — Other Ambulatory Visit: Payer: Self-pay

## 2019-12-15 DIAGNOSIS — R42 Dizziness and giddiness: Secondary | ICD-10-CM | POA: Insufficient documentation

## 2019-12-15 DIAGNOSIS — R9431 Abnormal electrocardiogram [ECG] [EKG]: Secondary | ICD-10-CM | POA: Insufficient documentation

## 2019-12-15 LAB — BASIC METABOLIC PANEL
BUN/Creatinine Ratio: 13 (ref 12–28)
BUN: 13 mg/dL (ref 8–27)
CO2: 24 mmol/L (ref 20–29)
Calcium: 8.8 mg/dL (ref 8.7–10.3)
Chloride: 99 mmol/L (ref 96–106)
Creatinine, Ser: 1.04 mg/dL — ABNORMAL HIGH (ref 0.57–1.00)
GFR calc Af Amer: 57 mL/min/{1.73_m2} — ABNORMAL LOW (ref >59)
GFR calc non Af Amer: 49 mL/min/{1.73_m2} — ABNORMAL LOW (ref >59)
Glucose: 332 mg/dL — ABNORMAL HIGH (ref 65–99)
Potassium: 3.6 mmol/L (ref 3.5–5.2)
Sodium: 137 mmol/L (ref 134–144)

## 2019-12-15 LAB — MYOCARDIAL PERFUSION IMAGING
LV dias vol: 44 mL (ref 46–106)
LV sys vol: 13 mL
Peak HR: 116 {beats}/min
Rest HR: 107 {beats}/min
SDS: 3
SRS: 0
SSS: 3
TID: 0.89

## 2019-12-15 LAB — MAGNESIUM: Magnesium: 1.5 mg/dL — ABNORMAL LOW (ref 1.6–2.3)

## 2019-12-15 MED ORDER — TECHNETIUM TC 99M TETROFOSMIN IV KIT
10.5000 | PACK | Freq: Once | INTRAVENOUS | Status: AC | PRN
  Administered 2019-12-15: 10.5 via INTRAVENOUS
  Filled 2019-12-15: qty 11

## 2019-12-15 MED ORDER — TECHNETIUM TC 99M TETROFOSMIN IV KIT
32.1000 | PACK | Freq: Once | INTRAVENOUS | Status: AC | PRN
  Administered 2019-12-15: 32.1 via INTRAVENOUS
  Filled 2019-12-15: qty 33

## 2019-12-15 MED ORDER — REGADENOSON 0.4 MG/5ML IV SOLN
0.4000 mg | Freq: Once | INTRAVENOUS | Status: AC
  Administered 2019-12-15: 0.4 mg via INTRAVENOUS

## 2019-12-15 MED ORDER — MAGNESIUM OXIDE 400 MG PO TABS: 400.0000 mg | ORAL_TABLET | Freq: Two times a day (BID) | ORAL | 0 refills | Status: DC

## 2019-12-15 NOTE — Telephone Encounter (Signed)
Left message on patients voicemail to please return our call.   

## 2019-12-15 NOTE — Telephone Encounter (Signed)
Patient's daughter in law called returning [name]'s call for results. Please call/advise   Thank you!

## 2019-12-15 NOTE — Telephone Encounter (Signed)
-----   Message from Berniece Salines, DO sent at 12/15/2019  8:08 AM EST ----- Your magnesium is low. I like to replace it with magnesium oxide 400 mg twice a day for the next 7 days.

## 2019-12-15 NOTE — Telephone Encounter (Signed)
I spoke with Sharee Pimple and gave her information from Dr Harriet Masson.  Will send prescription to Kristopher Oppenheim at [facility]

## 2019-12-15 NOTE — Addendum Note (Signed)
Addended by: Thompson Grayer on: 12/15/2019 11:04 AM   Modules accepted: Orders

## 2019-12-16 ENCOUNTER — Telehealth: Payer: Self-pay

## 2019-12-16 NOTE — Telephone Encounter (Signed)
-----   Message from Berniece Salines, DO sent at 12/16/2019 10:29 AM EST ----- Great news stress test normal.

## 2019-12-16 NOTE — Telephone Encounter (Signed)
Left message on patients voicemail to please return our call.   

## 2019-12-21 ENCOUNTER — Telehealth: Payer: Self-pay

## 2019-12-21 NOTE — Telephone Encounter (Signed)
-----   Message from Berniece Salines, DO sent at 12/16/2019 10:29 AM EST ----- Great news stress test normal.

## 2019-12-21 NOTE — Telephone Encounter (Signed)
Left message on patients voicemail to please return our call.   

## 2019-12-21 NOTE — Telephone Encounter (Signed)
Spoke with patients daughter-in-law regarding results and recommendation.  She verbalizes understanding and is agreeable to plan of care. Advised patient to call back with any issues or concerns.

## 2019-12-25 ENCOUNTER — Telehealth: Payer: Self-pay

## 2019-12-25 NOTE — Telephone Encounter (Signed)
Cindee Salt with [facility] called and stated pt is needing outpatient PT.  Dr. Etter Sjogren can choose to send pt to an outpatient therapy clinic of her choice per Cindee Salt.  His callback # is [phone].

## 2019-12-28 ENCOUNTER — Other Ambulatory Visit: Payer: Self-pay | Admitting: *Deleted

## 2019-12-28 NOTE — Patient Outreach (Signed)
  [facility]) Care Management Chronic Special Needs Program    12/28/2019  Name: Jessica Dennis, DOB: [birth year]  MRN: [id number]   Jessica Dennis is enrolled in a chronic special needs plan for Diabetes.  [facility] care management will continue to provide services for this member through 01/22/20.  [facility] care management team will assume care 01/23/20.   [name] [facility], BSN [place], [place]

## 2019-12-28 NOTE — Telephone Encounter (Signed)
Please advise 

## 2019-12-28 NOTE — Telephone Encounter (Signed)
Is this for the falls ?  Or another reason?

## 2019-12-29 NOTE — Telephone Encounter (Signed)
Called Erik back. LVM to call back

## 2019-12-30 ENCOUNTER — Encounter: Payer: Self-pay | Admitting: Family Medicine

## 2019-12-30 ENCOUNTER — Other Ambulatory Visit: Payer: Self-pay | Admitting: Family Medicine

## 2019-12-30 ENCOUNTER — Telehealth: Payer: Self-pay | Admitting: Family Medicine

## 2019-12-30 DIAGNOSIS — R2689 Other abnormalities of gait and mobility: Secondary | ICD-10-CM

## 2019-12-30 DIAGNOSIS — R296 Repeated falls: Secondary | ICD-10-CM

## 2019-12-30 NOTE — Telephone Encounter (Signed)
Called Eric back. See prior message

## 2019-12-30 NOTE — Telephone Encounter (Signed)
Caller: Rosalie Doctor Call back: [phone]   Calling heather back about questions on PT

## 2019-12-30 NOTE — Telephone Encounter (Signed)
Spoke with Randall Hiss. Randall Hiss states needing outpatient PT for falls and balance therapy

## 2019-12-30 NOTE — Telephone Encounter (Signed)
Order placed

## 2019-12-31 NOTE — Telephone Encounter (Signed)
Can we have this switched please?

## 2020-01-01 ENCOUNTER — Other Ambulatory Visit: Payer: Self-pay | Admitting: Family Medicine

## 2020-01-12 ENCOUNTER — Other Ambulatory Visit: Payer: Self-pay | Admitting: Family Medicine

## 2020-01-12 ENCOUNTER — Emergency Department (HOSPITAL_BASED_OUTPATIENT_CLINIC_OR_DEPARTMENT_OTHER): Payer: HMO

## 2020-01-12 ENCOUNTER — Inpatient Hospital Stay (HOSPITAL_BASED_OUTPATIENT_CLINIC_OR_DEPARTMENT_OTHER)
Admission: EM | Admit: 2020-01-12 | Discharge: 2020-01-17 | DRG: 291 | Disposition: A | Discharge status: Home Health | Payer: HMO | Attending: Internal Medicine | Admitting: Internal Medicine

## 2020-01-12 ENCOUNTER — Encounter (HOSPITAL_BASED_OUTPATIENT_CLINIC_OR_DEPARTMENT_OTHER): Payer: Self-pay

## 2020-01-12 ENCOUNTER — Encounter: Payer: Self-pay | Admitting: Family Medicine

## 2020-01-12 ENCOUNTER — Other Ambulatory Visit: Payer: Self-pay

## 2020-01-12 ENCOUNTER — Telehealth (INDEPENDENT_AMBULATORY_CARE_PROVIDER_SITE_OTHER): Payer: HMO | Admitting: Family Medicine

## 2020-01-12 ENCOUNTER — Ambulatory Visit (HOSPITAL_BASED_OUTPATIENT_CLINIC_OR_DEPARTMENT_OTHER)
Admission: RE | Admit: 2020-01-12 | Discharge: 2020-01-12 | Disposition: A | Discharge status: Home / Self Care | Payer: HMO | Source: Ambulatory Visit | Attending: Family Medicine | Admitting: Family Medicine

## 2020-01-12 ENCOUNTER — Telehealth: Payer: Self-pay | Admitting: *Deleted

## 2020-01-12 VITALS — BP 128/60 | HR 99

## 2020-01-12 DIAGNOSIS — Z79890 Hormone replacement therapy (postmenopausal): Secondary | ICD-10-CM

## 2020-01-12 DIAGNOSIS — Z20822 Contact with and (suspected) exposure to covid-19: Secondary | ICD-10-CM | POA: Diagnosis present

## 2020-01-12 DIAGNOSIS — D509 Iron deficiency anemia, unspecified: Secondary | ICD-10-CM | POA: Diagnosis present

## 2020-01-12 DIAGNOSIS — E039 Hypothyroidism, unspecified: Secondary | ICD-10-CM | POA: Diagnosis present

## 2020-01-12 DIAGNOSIS — K862 Cyst of pancreas: Secondary | ICD-10-CM | POA: Diagnosis present

## 2020-01-12 DIAGNOSIS — K7031 Alcoholic cirrhosis of liver with ascites: Secondary | ICD-10-CM | POA: Diagnosis present

## 2020-01-12 DIAGNOSIS — D500 Iron deficiency anemia secondary to blood loss (chronic): Secondary | ICD-10-CM

## 2020-01-12 DIAGNOSIS — Z66 Do not resuscitate: Secondary | ICD-10-CM | POA: Diagnosis present

## 2020-01-12 DIAGNOSIS — E1165 Type 2 diabetes mellitus with hyperglycemia: Secondary | ICD-10-CM | POA: Diagnosis present

## 2020-01-12 DIAGNOSIS — Z90710 Acquired absence of both cervix and uterus: Secondary | ICD-10-CM

## 2020-01-12 DIAGNOSIS — I482 Chronic atrial fibrillation, unspecified: Secondary | ICD-10-CM | POA: Diagnosis present

## 2020-01-12 DIAGNOSIS — D61818 Other pancytopenia: Secondary | ICD-10-CM | POA: Diagnosis present

## 2020-01-12 DIAGNOSIS — Z888 Allergy status to other drugs, medicaments and biological substances status: Secondary | ICD-10-CM

## 2020-01-12 DIAGNOSIS — K7290 Hepatic failure, unspecified without coma: Secondary | ICD-10-CM | POA: Diagnosis present

## 2020-01-12 DIAGNOSIS — D638 Anemia in other chronic diseases classified elsewhere: Secondary | ICD-10-CM | POA: Diagnosis present

## 2020-01-12 DIAGNOSIS — K219 Gastro-esophageal reflux disease without esophagitis: Secondary | ICD-10-CM | POA: Diagnosis present

## 2020-01-12 DIAGNOSIS — J90 Pleural effusion, not elsewhere classified: Secondary | ICD-10-CM

## 2020-01-12 DIAGNOSIS — F419 Anxiety disorder, unspecified: Secondary | ICD-10-CM | POA: Diagnosis present

## 2020-01-12 DIAGNOSIS — R0602 Shortness of breath: Secondary | ICD-10-CM

## 2020-01-12 DIAGNOSIS — K766 Portal hypertension: Secondary | ICD-10-CM | POA: Diagnosis present

## 2020-01-12 DIAGNOSIS — M1990 Unspecified osteoarthritis, unspecified site: Secondary | ICD-10-CM | POA: Diagnosis present

## 2020-01-12 DIAGNOSIS — G9341 Metabolic encephalopathy: Secondary | ICD-10-CM | POA: Diagnosis present

## 2020-01-12 DIAGNOSIS — K3189 Other diseases of stomach and duodenum: Secondary | ICD-10-CM | POA: Diagnosis not present

## 2020-01-12 DIAGNOSIS — Z85828 Personal history of other malignant neoplasm of skin: Secondary | ICD-10-CM

## 2020-01-12 DIAGNOSIS — Z96643 Presence of artificial hip joint, bilateral: Secondary | ICD-10-CM | POA: Diagnosis present

## 2020-01-12 DIAGNOSIS — Z7984 Long term (current) use of oral hypoglycemic drugs: Secondary | ICD-10-CM

## 2020-01-12 DIAGNOSIS — E876 Hypokalemia: Secondary | ICD-10-CM | POA: Diagnosis not present

## 2020-01-12 DIAGNOSIS — Z794 Long term (current) use of insulin: Secondary | ICD-10-CM | POA: Diagnosis not present

## 2020-01-12 DIAGNOSIS — I5033 Acute on chronic diastolic (congestive) heart failure: Secondary | ICD-10-CM | POA: Diagnosis present

## 2020-01-12 DIAGNOSIS — Z9889 Other specified postprocedural states: Secondary | ICD-10-CM

## 2020-01-12 DIAGNOSIS — Z833 Family history of diabetes mellitus: Secondary | ICD-10-CM

## 2020-01-12 DIAGNOSIS — H409 Unspecified glaucoma: Secondary | ICD-10-CM | POA: Diagnosis present

## 2020-01-12 DIAGNOSIS — D6959 Other secondary thrombocytopenia: Secondary | ICD-10-CM | POA: Diagnosis present

## 2020-01-12 DIAGNOSIS — I10 Essential (primary) hypertension: Secondary | ICD-10-CM | POA: Diagnosis not present

## 2020-01-12 DIAGNOSIS — I110 Hypertensive heart disease with heart failure: Secondary | ICD-10-CM | POA: Diagnosis not present

## 2020-01-12 DIAGNOSIS — E785 Hyperlipidemia, unspecified: Secondary | ICD-10-CM | POA: Diagnosis present

## 2020-01-12 DIAGNOSIS — E1140 Type 2 diabetes mellitus with diabetic neuropathy, unspecified: Secondary | ICD-10-CM | POA: Diagnosis present

## 2020-01-12 DIAGNOSIS — Z79899 Other long term (current) drug therapy: Secondary | ICD-10-CM

## 2020-01-12 DIAGNOSIS — Z9049 Acquired absence of other specified parts of digestive tract: Secondary | ICD-10-CM

## 2020-01-12 DIAGNOSIS — I5032 Chronic diastolic (congestive) heart failure: Secondary | ICD-10-CM

## 2020-01-12 DIAGNOSIS — R161 Splenomegaly, not elsewhere classified: Secondary | ICD-10-CM | POA: Diagnosis present

## 2020-01-12 DIAGNOSIS — Z8249 Family history of ischemic heart disease and other diseases of the circulatory system: Secondary | ICD-10-CM

## 2020-01-12 LAB — BASIC METABOLIC PANEL
Anion gap: 11 (ref 5–15)
BUN: 23 mg/dL (ref 8–23)
CO2: 25 mmol/L (ref 22–32)
Calcium: 9.2 mg/dL (ref 8.9–10.3)
Chloride: 104 mmol/L (ref 98–111)
Creatinine, Ser: 1.3 mg/dL — ABNORMAL HIGH (ref 0.44–1.00)
GFR, Estimated: 40 mL/min — ABNORMAL LOW (ref >60)
Glucose, Bld: 105 mg/dL — ABNORMAL HIGH (ref 70–99)
Potassium: 3.3 mmol/L — ABNORMAL LOW (ref 3.5–5.1)
Sodium: 140 mmol/L (ref 135–145)

## 2020-01-12 LAB — RETICULOCYTES
Immature Retic Fract: 26.6 % — ABNORMAL HIGH (ref 2.3–15.9)
RBC.: 3.32 MIL/uL — ABNORMAL LOW (ref 3.87–5.11)
Retic Count, Absolute: 92.2 10*3/uL (ref 19.0–186.0)
Retic Ct Pct: 2.7 % (ref 0.4–3.1)

## 2020-01-12 LAB — COMPREHENSIVE METABOLIC PANEL
ALT: 17 U/L (ref 0–35)
AST: 34 U/L (ref 0–37)
Albumin: 3.2 g/dL — ABNORMAL LOW (ref 3.5–5.2)
Alkaline Phosphatase: 97 U/L (ref 39–117)
BUN: 23 mg/dL (ref 6–23)
CO2: 28 mEq/L (ref 19–32)
Calcium: 8.7 mg/dL (ref 8.4–10.5)
Chloride: 102 mEq/L (ref 96–112)
Creatinine, Ser: 1.39 mg/dL — ABNORMAL HIGH (ref 0.40–1.20)
GFR: 34.71 mL/min — ABNORMAL LOW (ref >60.00)
Glucose, Bld: 325 mg/dL — ABNORMAL HIGH (ref 70–99)
Potassium: 3.9 mEq/L (ref 3.5–5.1)
Sodium: 138 mEq/L (ref 135–145)
Total Bilirubin: 1.3 mg/dL — ABNORMAL HIGH (ref 0.2–1.2)
Total Protein: 5.5 g/dL — ABNORMAL LOW (ref 6.0–8.3)

## 2020-01-12 LAB — IRON AND TIBC
Iron: 19 ug/dL — ABNORMAL LOW (ref 28–170)
Saturation Ratios: 6 % — ABNORMAL LOW (ref 10.4–31.8)
TIBC: 336 ug/dL (ref 250–450)
UIBC: 317 ug/dL

## 2020-01-12 LAB — CBC WITH DIFFERENTIAL/PLATELET
Abs Immature Granulocytes: 0.02 10*3/uL (ref 0.00–0.07)
Basophils Absolute: 0 10*3/uL (ref 0.0–0.1)
Basophils Absolute: 0 10*3/uL (ref 0.0–0.1)
Basophils Relative: 0.9 % (ref 0.0–3.0)
Basophils Relative: 0 %
Eosinophils Absolute: 0.1 10*3/uL (ref 0.0–0.7)
Eosinophils Absolute: 0.2 10*3/uL (ref 0.0–0.5)
Eosinophils Relative: 2.1 % (ref 0.0–5.0)
Eosinophils Relative: 3 %
HCT: 24.1 % — ABNORMAL LOW (ref 36.0–46.0)
HCT: 27.5 % — ABNORMAL LOW (ref 36.0–46.0)
Hemoglobin: 7.6 g/dL — CL (ref 12.0–15.0)
Hemoglobin: 8.1 g/dL — ABNORMAL LOW (ref 12.0–15.0)
Immature Granulocytes: 0 %
Lymphocytes Relative: 12.7 % (ref 12.0–46.0)
Lymphocytes Relative: 19 %
Lymphs Abs: 0.6 10*3/uL — ABNORMAL LOW (ref 0.7–4.0)
Lymphs Abs: 1 10*3/uL (ref 0.7–4.0)
MCH: 23.7 pg — ABNORMAL LOW (ref 26.0–34.0)
MCHC: 31.5 g/dL (ref 30.0–36.0)
MCHC: 29.5 g/dL — ABNORMAL LOW (ref 30.0–36.0)
MCV: 75.9 fl — ABNORMAL LOW (ref 78.0–100.0)
MCV: 80.4 fL (ref 80.0–100.0)
Monocytes Absolute: 0.3 10*3/uL (ref 0.1–1.0)
Monocytes Absolute: 0.4 10*3/uL (ref 0.1–1.0)
Monocytes Relative: 7.3 % (ref 3.0–12.0)
Monocytes Relative: 8 %
Neutro Abs: 3.6 10*3/uL (ref 1.4–7.7)
Neutro Abs: 3.5 10*3/uL (ref 1.7–7.7)
Neutrophils Relative %: 77 % (ref 43.0–77.0)
Neutrophils Relative %: 70 %
Platelets: 60 10*3/uL — ABNORMAL LOW (ref 150.0–400.0)
Platelets: 62 10*3/uL — ABNORMAL LOW (ref 150–400)
RBC: 3.17 Mil/uL — ABNORMAL LOW (ref 3.87–5.11)
RBC: 3.42 MIL/uL — ABNORMAL LOW (ref 3.87–5.11)
RDW: 20.6 % — ABNORMAL HIGH (ref 11.5–15.5)
RDW: 20.2 % — ABNORMAL HIGH (ref 11.5–15.5)
WBC: 4.7 10*3/uL (ref 4.0–10.5)
WBC: 5.1 10*3/uL (ref 4.0–10.5)
nRBC: 0 % (ref 0.0–0.2)

## 2020-01-12 LAB — FOLATE: Folate: 16.6 ng/mL (ref >5.9)

## 2020-01-12 LAB — TROPONIN I (HIGH SENSITIVITY)
Troponin I (High Sensitivity): 14 ng/L (ref <18)
Troponin I (High Sensitivity): 12 ng/L (ref <18)

## 2020-01-12 LAB — RESP PANEL BY RT-PCR (FLU A&B, COVID) ARPGX2
Influenza A by PCR: NEGATIVE
Influenza B by PCR: NEGATIVE
SARS Coronavirus 2 by RT PCR: NEGATIVE

## 2020-01-12 LAB — FERRITIN: Ferritin: 11 ng/mL (ref 11–307)

## 2020-01-12 LAB — BRAIN NATRIURETIC PEPTIDE: Pro B Natriuretic peptide (BNP): 195 pg/mL — ABNORMAL HIGH (ref 0.0–100.0)

## 2020-01-12 LAB — VITAMIN B12: Vitamin B-12: 1101 pg/mL — ABNORMAL HIGH (ref 180–914)

## 2020-01-12 MED ORDER — FUROSEMIDE 10 MG/ML IJ SOLN
40.0000 mg | Freq: Once | INTRAMUSCULAR | Status: DC
  Filled 2020-01-12: qty 4

## 2020-01-12 MED ORDER — IOHEXOL 350 MG/ML SOLN
100.0000 mL | Freq: Once | INTRAVENOUS | Status: AC | PRN
  Administered 2020-01-12: 80 mL via INTRAVENOUS

## 2020-01-12 MED ORDER — ALBUMIN HUMAN 25 % IV SOLN
50.0000 g | Freq: Once | INTRAVENOUS | Status: AC
  Administered 2020-01-12: 50 g via INTRAVENOUS
  Filled 2020-01-12: qty 200

## 2020-01-12 MED ORDER — ALBUMIN HUMAN 25 % IV SOLN: 12.5000 g | Freq: Once | INTRAVENOUS | Status: DC

## 2020-01-12 NOTE — Telephone Encounter (Signed)
It would be ok to take an extra lasix today but her hgb dropped almost 2 g --- that is contributing to her sob  She needs to go to ER

## 2020-01-12 NOTE — ED Provider Notes (Signed)
[facility] EMERG[facility]Y DEPARTMENT Provider Note   CSN: [id number] Arrival date & time: 01/12/20  1729     History Chief Complaint  Patient presents with  . Abnormal Lab    Jessica Dennis is a 86 y.o. female history diabetes, reflux, hypertension, iron deficiency anemia, alcohol cirrhosis, here presenting with shortness of breath and low hemoglobin.  Patient was admitted for pneumonia about 3 months ago and has been short of breath since then.  Over the last week, she noticed that her shortness of breath gotten worse.  She has shortness of breath at night and with some exertion.  She also has some leg swelling as well.  Patient had a video visit with her doctor today and a chest x-ray that showed large right pleural effusion.  She also had lab work done earlier today that showed a hemoglobin drop to 7.6 from baseline of 9.  Patient denies any blood in her stool but she states that she still drinks alcohol occasionally.  She has a history of cirrhosis and some abdominal distention as well.  Patient denies any Covid exposure.  The history is provided by the patient.       Past Medical History:  Diagnosis Date  . Anemia   . Angiodysplasia of ascending colon 10/25/2014  . Anxiety   . Arthritis   . Borderline diabetes   . Cancer of the skin, basal cell 09/03/2012  . Cirrhosis ([place])   . Diabetes mellitus without complication ([place])   . Diverticular disease   . 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 GERD (gastroesophageal reflux disease)   . Hypertension   . Iron deficiency anemia due to chronic blood loss 01/19/2015  . Portal hypertensive gastropathy ([place]) 08/02/2016   ? Some GAVE also  . Thyroid disease     Patient Active Problem List   Diagnosis Date Noted  . Lightheadedness 11/27/2019  . Nonspecific abnormal electrocardiogram (ECG) (EKG) 11/27/2019  . Abnormal electrocardiogram 11/27/2019  . Bilateral leg edema 11/27/2019  . Type 2 diabetes mellitus  without complication, with long-term current use of insulin ([place]) 11/27/2019  . Primary hypertension 11/27/2019  . Chronic atrial fibrillation ([place]) 11/09/2019  . Shortness of breath 11/09/2019  . Multiple falls   . Hypovolemia 10/17/2019  . Acute lower UTI 10/17/2019  . Rhabdomyolysis 10/17/2019  . AKI (acute kidney injury) ([place]) 10/16/2019  . Hyperlipidemia associated with type 2 diabetes mellitus ([place]) 10/27/2018  . Generalized anxiety disorder 10/27/2018  . Open wound, lower leg, left, initial encounter 10/27/2018  . Tremor of right hand 10/27/2018  . 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 Alcoholic cirrhosis of liver with ascites ([place]) 08/02/2016  . Portal hypertensive gastropathy ([place]) 08/02/2016  . Iron deficiency anemia due to chronic blood loss 01/19/2015  . Occult GI bleeding 01/19/2015  . Angiodysplasia of ascending colon 10/25/2014  . H/O bone density study 04/27/2013  . Type 2 diabetes mellitus with hyperglycemia, with long-term current use of insulin ([place]) 03/24/2013  . Vaginal dryness, menopausal 01/20/2013  . Atrophic vaginitis 01/20/2013  . S/P hysterectomy 01/20/2013  . Cervical nerve root disorder 11/18/2012  . Shoulder weakness 11/18/2012  . Cancer of the skin, basal cell 09/03/2012  . Squamous cell skin cancer 09/03/2012  . Thrombocytopenia ([place]) 08/14/2012  . Lymphadenopathy of right cervical region 04/01/2012  . Hyperglycemia 09/19/2011  . Heel pain 08/06/2011  . Plantar fasciitis 08/06/2011  . Arthritis of knee, degenerative 08/06/2011  . Gonalgia 08/06/2011  . History of anemia 06/28/2011  .  Anxiety 06/28/2011  . Essential hypertension, benign 06/28/2011  . GERD (gastroesophageal reflux disease) 06/28/2011  . Hyperlipidemia LDL goal <100 06/28/2011  . History of hysterectomy 06/28/2011  . Menopause 06/28/2011  . Glaucoma 06/28/2011  . DJD (degenerative joint disease) 06/28/2011  . Essential hypertension 04/27/2010  . Esophageal  reflux 10/28/2009  . Anemia, unspecified 05/24/2009  . Iron deficiency anemia, unspecified 05/24/2009  . Hypothyroidism 11/22/2008  . Adult hypothyroidism 11/22/2008  . Diverticulosis of colon 05/15/2007  . Elevated fasting blood sugar 05/15/2007  . Other psoriasis 05/15/2007  . Symptomatic menopausal or female climacteric states 05/15/2007  . Impaired fasting glucose 05/15/2007    Past Surgical History:  Procedure Laterality Date  . ABDOMINAL HYSTERECTOMY    . APPENDECTOMY    . COLONOSCOPY    . ESOPHAGOGASTRODUODENOSCOPY    . IR PARACENTESIS  05/25/2016  . LEG SKIN LESION  BIOPSY / EXCISION  12/11/14  . MOHS SURGERY     ankle  . TONSILLECTOMY    . TOTAL HIP ARTHROPLASTY Bilateral 1993, 2006  . UPPER GASTROINTESTINAL ENDOSCOPY  09/09/2017     OB History    Gravida  2   Para  2   Term      Preterm      AB      Living  2     SAB      IAB      Ectopic      Multiple      Live Births              Family History  Problem Relation Age of Onset  . Bladder Cancer Father   . Heart attack Father   . Diabetes Mother   . Colon cancer Neg Hx   . Colon polyps Neg Hx   . Esophageal cancer Neg Hx   . Rectal cancer Neg Hx   . Stomach cancer Neg Hx     Social History   Tobacco Use  . Smoking status: Never Smoker  . Smokeless tobacco: Never Used  Vaping Use  . Vaping Use: Never used  Substance Use Topics  . Alcohol use: Yes    Comment: weekly  . Drug use: No    Home Medications Prior to Admission medications   Medication Sig Start Date End Date Taking? Authorizing Provider  Accu-Chek FastClix Lancets MISC CHECK FOR BLOOD SUGAR DAILY 11/12/18   Carollee Herter, Alferd Apa, DO  alclomethasone (ACLOVATE) 0.05 % cream Apply 1 application topically daily as needed. 10/14/19   [provider]  ammonium lactate (AMLACTIN) 12 % cream Apply topically daily as needed.  05/20/18   [provider]  Apoaequorin (PREVAGEN PO) Take 1 tablet by mouth daily.     [provider]  Ascorbic Acid (VITAMIN C) 1000 MG tablet Take 1,000 mg by mouth daily.    [provider]  CALCIUM CARBONATE-VITAMIN D PO Take by mouth. Calcium 1200mg   Vitamin D 5000 units  Pt also has a calcium 600mg  plus 800 units vitamin D supplement    [provider]  furosemide (LASIX) 20 MG tablet TAKE ONE TABLET BY MOUTH TWICE A DAY 12/14/19   Ann Held, DO  gentamicin ointment (GARAMYCIN) 0.1 % Apply 1 application topically as needed.  06/04/19   [provider]  Insulin Aspart Prot & Aspart (NOVOLOG MIX 70/30 [place]) Inject into the skin. 35 units in the mornings and 25 at night    [provider]  latanoprost (XALATAN) 0.005 % ophthalmic  solution Place 1 drop into both eyes at bedtime.  08/19/12   [provider]  levothyroxine (SYNTHROID) 75 MCG tablet Take 1 tablet (75 mcg total) by mouth daily. 11/09/19   Roma Schanz R, DO  magnesium oxide (MAG-OX) 400 MG tablet Take 1 tablet (400 mg total) by mouth 2 (two) times daily. For 7 days 12/15/19   Tobb, Kardie, DO  metFORMIN (GLUCOPHAGE-XR) 500 MG 24 hr tablet Take 1 tablet (500 mg total) by mouth daily. 11/02/19   Roma Schanz R, DO  metoprolol succinate (TOPROL-XL) 25 MG 24 hr tablet TAKE 0.5 TABLETS BY MOUTH DAILY 01/01/20   Carollee Herter, Alferd Apa, DO  Omega-3 1000 MG CAPS Take 1,000 mg by mouth. 07/16/19   [provider]  pantoprazole (PROTONIX) 40 MG tablet Take 1 tablet (40 mg total) by mouth daily. 07/24/19   Gatha Mayer, MD  Prednicarbate 0.1 % CREA Apply topically 2 (two) times daily.     [provider]  simvastatin (ZOCOR) 20 MG tablet TAKE ONE TABLET BY MOUTH EVERY NIGHT AT BEDTIME 12/14/19   Carollee Herter, Alferd Apa, DO  spironolactone (ALDACTONE) 25 MG tablet Take 0.5 tablets (12.5 mg total) by mouth daily. 11/27/19   Tobb, Kardie, DO  SSD 1 % cream 1 application daily.  02/06/18   [provider]  vitamin E 180 MG (400  UNITS) capsule Take by mouth daily.     [provider]    Allergies    Tizanidine  Review of Systems   Review of Systems  Respiratory: Positive for shortness of breath.   Gastrointestinal: Positive for abdominal distention.  All other systems reviewed and are negative.   Physical Exam Updated Vital Signs BP (!) 105/56   Pulse 71   Temp 97.9 F (36.6 C) (Oral)   Resp 15   Ht 5\' 7"  (1.702 m)   Wt 81.6 kg   SpO2 92%   BMI 28.19 kg/m   Physical Exam Vitals and nursing note reviewed.  Constitutional:      Comments: Chronically ill, slightly pale   HENT:     Head: Normocephalic.     Nose: Nose normal.     Mouth/Throat:     Mouth: Mucous membranes are dry.  Eyes:     Extraocular Movements: Extraocular movements intact.     Pupils: Pupils are equal, round, and reactive to light.  Cardiovascular:     Rate and Rhythm: Normal rate and regular rhythm.     Pulses: Normal pulses.     Heart sounds: Normal heart sounds.  Pulmonary:     Comments: Tachypneic, no breath sounds R base  Abdominal:     Comments: + distended, mild fluid wave, nontender   Musculoskeletal:     Cervical back: Normal range of motion and neck supple.     Comments: 1+ edema bilaterally   Skin:    General: Skin is warm.     Capillary Refill: Capillary refill takes less than 2 seconds.  Neurological:     General: No focal deficit present.     Mental Status: She is oriented to person, place, and time.  Psychiatric:        Mood and Affect: Mood normal.        Behavior: Behavior normal.     ED Results / Procedures / Treatments   Labs (all labs ordered are listed, but only abnormal results are displayed) Labs Reviewed  CBC WITH DIFFERENTIAL/PLATELET - Abnormal; Notable for the following components:  Result Value   RBC 3.42 (*)    Hemoglobin 8.1 (*)    HCT 27.5 (*)    MCH 23.7 (*)    MCHC 29.5 (*)    RDW 20.2 (*)    Platelets 62 (*)    All other components within normal limits   BASIC METABOLIC PANEL - Abnormal; Notable for the following components:   Potassium 3.3 (*)    Glucose, Bld 105 (*)    Creatinine, Ser 1.30 (*)    GFR, Estimated 40 (*)    All other components within normal limits  RESP PANEL BY RT-PCR (FLU A&B, COVID) ARPGX2  VITAMIN B12  FOLATE  IRON AND TIBC  FERRITIN  RETICULOCYTES  TROPONIN I (HIGH SENSITIVITY)  TROPONIN I (HIGH SENSITIVITY)    EKG None  Radiology DG Chest 2 View  Result Date: 01/12/2020 CLINICAL DATA:  Shortness of breath for the past month. EXAM: CHEST - 2 VIEW COMPARISON:  Chest x-ray dated November 14, 2019. FINDINGS: The right heart is obscured. Normal pulmonary vascularity. Increasing now moderate to large right pleural effusion with likely right lower lobe collapse and right middle lobe atelectasis. New trace left pleural effusion. No pneumothorax. No acute osseous abnormality. IMPRESSION: 1. Increasing now moderate to large right pleural effusion. 2. New trace left pleural effusion. Electronically Signed   By: Titus Dubin M.D.   On: 01/12/2020 12:52   CT Angio Chest PE W and/or Wo Contrast  Result Date: 01/12/2020 CLINICAL DATA:  Abdominal distension, large right pleural effusion on chest x-ray. Suspected pulmonary embolus. EXAM: CT ANGIOGRAPHY CHEST CT ABDOMEN AND PELVIS WITH CONTRAST TECHNIQUE: Multidetector CT imaging of the chest was performed using the standard protocol during bolus administration of intravenous contrast. Multiplanar CT image reconstructions and MIPs were obtained to evaluate the vascular anatomy. Multidetector CT imaging of the abdomen and pelvis was performed using the standard protocol during bolus administration of intravenous contrast. CONTRAST:  76mL OMNIPAQUE IOHEXOL 350 MG/ML SOLN COMPARISON:  CT abdomen pelvis 04/28/2014 FINDINGS: CTA CHEST FINDINGS Cardiovascular: Satisfactory opacification of the pulmonary arteries to the segmental level. No evidence of pulmonary embolism. Normal heart  size. No significant pericardial effusion. The thoracic aorta is normal in caliber. Mild-to-moderate atherosclerotic plaque of the thoracic aorta. Suggestion of mild mitral annular calcifications. Mild coronary artery calcifications. Mediastinum/Nodes: No enlarged mediastinal, hilar, or axillary lymph nodes. Thyroid gland, trachea, and esophagus demonstrate no significant findings. Lungs/Pleura: Partial collapse of the right lower lobe. Passive atelectasis of the right upper lobe. Left base atelectasis. Right large right pleural effusion. No left pleural effusion. No pneumothorax. Musculoskeletal: No chest wall abnormality No suspicious lytic or blastic osseous lesions. No acute displaced fracture. Multilevel degenerative changes of the spine with severe intervertebral disc space narrowing and endplate sclerosis at the T3-T8 levels. Review of the MIP images confirms the above findings. CT ABDOMEN and PELVIS FINDINGS Hepatobiliary: Nodular hepatic contour. No focal hepatic lesion on this single contrast study. Calcification within the gallbladder lumen likely represents a gallstone. Otherwise no gallbladder wall thickening or pericholecystic fluid. No biliary dilatation. Pancreas: There is a 1.1 cm hypodensity within the pancreatic head. Normal pancreatic contour. No surrounding inflammatory changes. No main pancreatic ductal dilatation. Spleen: The spleen is enlarged in size.  No splenic lesion noted. Adrenals/Urinary Tract: No adrenal nodule bilaterally. Bilateral kidneys enhance symmetrically. Subcentimeter hypodensities are too small to characterize. No hydronephrosis. No hydroureter. The urinary bladder is unremarkable. Stomach/Bowel: Stomach is within normal limits. No evidence of bowel wall thickening or dilatation. Diffuse  colonic diverticulosis. Status post appendectomy. Vascular/Lymphatic: [place] off a GIA ill, perigastric, perisplenic venous collaterals. Recanalized paraumbilical vein. The hepatic, portal,  splenic, superior mesenteric veins are patent. No abdominal aorta or iliac aneurysm. At least mild atherosclerotic plaque of the aorta and its branches. 5 mm calcification in the region of the left kidney likely vascular. No abdominal, pelvic, or inguinal lymphadenopathy. Reproductive: Not visualized due to streak artifact originating from bilateral femoral surgical hardware. Other: Trace to small simple free fluid within the abdomen and pelvis. No intraperitoneal free gas. No organized fluid collection. Musculoskeletal: No abdominal wall hernia or abnormality. No suspicious lytic or blastic osseous lesions. No acute displaced fracture. Multilevel degenerative changes of the spine with L2-L3 and L4-L5 intervertebral disc space vacuum phenomenon and endplate sclerosis. Bilateral total hip arthroplasty. Review of the MIP images confirms the above findings. IMPRESSION: 1. No pulmonary embolus. 2. Large right pleural effusion with partial collapse of the right lower lobe. 3. Cirrhotic morphology with portal hypertension. Markedly limited evaluation of the hepatic parenchyma for focal masses on this single phase study. Recommend MRI liver protocol for further evaluation. 4. Trace to small volume simple free fluid ascites. 5. A 1.1 cm hypodensity within the pancreatic head with no associated main pancreatic duct dilatation distally. Finding of unclear etiology. Can be further evaluated on MRI liver protocol. 6. Diffuse colonic diverticulosis with no acute diverticulitis. 7. Cholelithiasis with no acute cholecystitis. Electronically Signed   By: Iven Finn M.D.   On: 01/12/2020 21:05   CT ABDOMEN PELVIS W CONTRAST  Result Date: 01/12/2020 CLINICAL DATA:  Abdominal distension, large right pleural effusion on chest x-ray. Suspected pulmonary embolus. EXAM: CT ANGIOGRAPHY CHEST CT ABDOMEN AND PELVIS WITH CONTRAST TECHNIQUE: Multidetector CT imaging of the chest was performed using the standard protocol during bolus  administration of intravenous contrast. Multiplanar CT image reconstructions and MIPs were obtained to evaluate the vascular anatomy. Multidetector CT imaging of the abdomen and pelvis was performed using the standard protocol during bolus administration of intravenous contrast. CONTRAST:  50mL OMNIPAQUE IOHEXOL 350 MG/ML SOLN COMPARISON:  CT abdomen pelvis 04/28/2014 FINDINGS: CTA CHEST FINDINGS Cardiovascular: Satisfactory opacification of the pulmonary arteries to the segmental level. No evidence of pulmonary embolism. Normal heart size. No significant pericardial effusion. The thoracic aorta is normal in caliber. Mild-to-moderate atherosclerotic plaque of the thoracic aorta. Suggestion of mild mitral annular calcifications. Mild coronary artery calcifications. Mediastinum/Nodes: No enlarged mediastinal, hilar, or axillary lymph nodes. Thyroid gland, trachea, and esophagus demonstrate no significant findings. Lungs/Pleura: Partial collapse of the right lower lobe. Passive atelectasis of the right upper lobe. Left base atelectasis. Right large right pleural effusion. No left pleural effusion. No pneumothorax. Musculoskeletal: No chest wall abnormality No suspicious lytic or blastic osseous lesions. No acute displaced fracture. Multilevel degenerative changes of the spine with severe intervertebral disc space narrowing and endplate sclerosis at the T3-T8 levels. Review of the MIP images confirms the above findings. CT ABDOMEN and PELVIS FINDINGS Hepatobiliary: Nodular hepatic contour. No focal hepatic lesion on this single contrast study. Calcification within the gallbladder lumen likely represents a gallstone. Otherwise no gallbladder wall thickening or pericholecystic fluid. No biliary dilatation. Pancreas: There is a 1.1 cm hypodensity within the pancreatic head. Normal pancreatic contour. No surrounding inflammatory changes. No main pancreatic ductal dilatation. Spleen: The spleen is enlarged in size.  No  splenic lesion noted. Adrenals/Urinary Tract: No adrenal nodule bilaterally. Bilateral kidneys enhance symmetrically. Subcentimeter hypodensities are too small to characterize. No hydronephrosis. No hydroureter. The urinary bladder  is unremarkable. Stomach/Bowel: Stomach is within normal limits. No evidence of bowel wall thickening or dilatation. Diffuse colonic diverticulosis. Status post appendectomy. Vascular/Lymphatic: [place] off a GIA ill, perigastric, perisplenic venous collaterals. Recanalized paraumbilical vein. The hepatic, portal, splenic, superior mesenteric veins are patent. No abdominal aorta or iliac aneurysm. At least mild atherosclerotic plaque of the aorta and its branches. 5 mm calcification in the region of the left kidney likely vascular. No abdominal, pelvic, or inguinal lymphadenopathy. Reproductive: Not visualized due to streak artifact originating from bilateral femoral surgical hardware. Other: Trace to small simple free fluid within the abdomen and pelvis. No intraperitoneal free gas. No organized fluid collection. Musculoskeletal: No abdominal wall hernia or abnormality. No suspicious lytic or blastic osseous lesions. No acute displaced fracture. Multilevel degenerative changes of the spine with L2-L3 and L4-L5 intervertebral disc space vacuum phenomenon and endplate sclerosis. Bilateral total hip arthroplasty. Review of the MIP images confirms the above findings. IMPRESSION: 1. No pulmonary embolus. 2. Large right pleural effusion with partial collapse of the right lower lobe. 3. Cirrhotic morphology with portal hypertension. Markedly limited evaluation of the hepatic parenchyma for focal masses on this single phase study. Recommend MRI liver protocol for further evaluation. 4. Trace to small volume simple free fluid ascites. 5. A 1.1 cm hypodensity within the pancreatic head with no associated main pancreatic duct dilatation distally. Finding of unclear etiology. Can be further evaluated  on MRI liver protocol. 6. Diffuse colonic diverticulosis with no acute diverticulitis. 7. Cholelithiasis with no acute cholecystitis. Electronically Signed   By: Iven Finn M.D.   On: 01/12/2020 21:05    Procedures Procedures (including critical care time)  Medications Ordered in ED Medications  albumin human 25 % solution 50 g (has no administration in time range)  iohexol (OMNIPAQUE) 350 MG/ML injection 100 mL (80 mLs Intravenous Contrast Given 01/12/20 2007)    ED Course  I have reviewed the triage vital signs and the nursing notes.  Pertinent labs & imaging results that were available during my care of the patient were reviewed by me and considered in my medical decision making (see chart for details).    MDM Rules/Calculators/A&P                         Vali Alderfer is a 86 y.o. female here with SOB.  I reviewed records from earlier in the day.  Patient has a large right pleural effusion.  She has a BNP of 200.  Patient appears fluid overloaded.  She is also anemic to 7.6.  She has no blood in her stool.  I think her anemia can be from underlying mass versus cirrhosis.  Patient has a history of iron deficiency and anemia so we will check anemia panel.  Plan to get CTA chest to rule out PE and to rule out mass and CT of the pelvis given cirrhosis and abdominal distention to rule out ascites.    9:27 PM Hg is 8.1. CT showed large R pleural effusion and hypodensity of the pancreas. Will admit for thoracentesis. Patient dropped her oxygen to 89% when she ambulates.   Final Clinical Impression(s) / ED Diagnoses Final diagnoses:  None    Rx / DC Orders ED Discharge Orders    None       Drenda Freeze, MD 01/12/20 2128

## 2020-01-12 NOTE — Telephone Encounter (Signed)
Pt to go to er

## 2020-01-12 NOTE — Progress Notes (Signed)
Virtual Visit via Video Note  I connected with Jessica Dennis on 01/12/20 at 11:20 AM EST by a video enabled telemedicine application and verified that I am speaking with the correct person using two identifiers.  Location: Patient: home with son and husband  Provider: office    I discussed the limitations of evaluation and management by telemedicine and the availability of in person appointments. The patient expressed understanding and agreed to proceed.  History of Present Illness: Pt is home with her son c/o inc sob ,   No fevers -- dry cough Sob has been worsening Pt has been laying around -- not doing much    Observations/Objective: Vitals:   01/12/20 1129  BP: 128/60  Pulse: 99  SpO2: 93%    Pt sitting in chair at home--- husband and son are with her Daughter in law was on the phone  Pt is sob but not worse than usual at rest    Assessment and Plan: 1. SOB (shortness of breath) Worsening --- check labs and xray   - DG Chest 2 View; Future - CBC with Differential/Platelet; Future - Comprehensive metabolic panel; Future - B Nat Peptide; Future If symptoms worsen -- go to ER  Follow Up Instructions:    I discussed the assessment and treatment plan with the patient. The patient was provided an opportunity to ask questions and all were answered. The patient agreed with the plan and demonstrated an understanding of the instructions.   The patient was advised to call back or seek an in-person evaluation if the symptoms worsen or if the condition fails to improve as anticipated.  I provided 25 minutes of non-face-to-face time during this encounter.   Ann Held, DO

## 2020-01-12 NOTE — Telephone Encounter (Signed)
Sharee Pimple- Daughter in law is aware to take the pt to the ER. She is aware the ER can address the lasix concern.

## 2020-01-12 NOTE — Telephone Encounter (Signed)
CRITICAL VALUE STICKER  CRITICAL VALUE:  Hgb  7.6  RECEIVER (on-site recipient of call):  Kelle Darting, [place] NOTIFIED:   01/12/20 @ 4:15pm  MESSENGER (representative from lab):  Shari Prows.  MD NOTIFIED:  [place]: 4:17pm  RESPONSE:

## 2020-01-12 NOTE — ED Triage Notes (Signed)
Pt sent by PCP for low hgb-pt NAD-to triage in w/c

## 2020-01-12 NOTE — ED Notes (Addendum)
Pt's daughter in law reports that the Pt has DNR that was signed today and wanted to know if they need to be scanned in or not. Darl Householder, EDP notified and will discuss with the Pt. Pt and Daughter in [place] notified and Daughter in Law verbalized understanding.

## 2020-01-12 NOTE — ED Notes (Signed)
Spoke to Yao, EDP and notifed of BP of 106/57 and order is to hold Lasix at this time.

## 2020-01-12 NOTE — ED Notes (Signed)
Patient ambulated in room (walking in place with walker)  SpO2 89% at lowest, HR 95-115, + DOE, SpO2 recovered quickly to low 90's while resting.

## 2020-01-12 NOTE — ED Provider Notes (Signed)
Ultrasound ED Peripheral IV (Provider)  Date/Time: 01/12/2020 8:02 PM Performed by: Tedd Sias, PA Authorized by: Tedd Sias, PA   Procedure details:    Indications: multiple failed IV attempts and poor IV access     Skin Prep: chlorhexidine gluconate     Location:  Right AC   Angiocath:  20 G   Bedside Ultrasound Guided: Yes     Images: not archived     Patient tolerated procedure without complications: Yes     Dressing applied: Yes       Tedd Sias, PA 01/12/20 2002    Drenda Freeze, MD 01/12/20 2227

## 2020-01-13 ENCOUNTER — Encounter (HOSPITAL_COMMUNITY): Payer: Self-pay | Admitting: Internal Medicine

## 2020-01-13 ENCOUNTER — Other Ambulatory Visit: Payer: Self-pay | Admitting: *Deleted

## 2020-01-13 DIAGNOSIS — K7031 Alcoholic cirrhosis of liver with ascites: Secondary | ICD-10-CM | POA: Diagnosis not present

## 2020-01-13 DIAGNOSIS — Z794 Long term (current) use of insulin: Secondary | ICD-10-CM

## 2020-01-13 DIAGNOSIS — I5033 Acute on chronic diastolic (congestive) heart failure: Secondary | ICD-10-CM

## 2020-01-13 DIAGNOSIS — I10 Essential (primary) hypertension: Secondary | ICD-10-CM | POA: Diagnosis not present

## 2020-01-13 DIAGNOSIS — K3189 Other diseases of stomach and duodenum: Secondary | ICD-10-CM

## 2020-01-13 DIAGNOSIS — K766 Portal hypertension: Secondary | ICD-10-CM

## 2020-01-13 DIAGNOSIS — I5032 Chronic diastolic (congestive) heart failure: Secondary | ICD-10-CM

## 2020-01-13 DIAGNOSIS — R0602 Shortness of breath: Secondary | ICD-10-CM

## 2020-01-13 DIAGNOSIS — J90 Pleural effusion, not elsewhere classified: Secondary | ICD-10-CM | POA: Diagnosis not present

## 2020-01-13 DIAGNOSIS — E1165 Type 2 diabetes mellitus with hyperglycemia: Secondary | ICD-10-CM

## 2020-01-13 LAB — CBC WITH DIFFERENTIAL/PLATELET
Abs Immature Granulocytes: 0.01 10*3/uL (ref 0.00–0.07)
Basophils Absolute: 0 10*3/uL (ref 0.0–0.1)
Basophils Relative: 0 %
Eosinophils Absolute: 0.1 10*3/uL (ref 0.0–0.5)
Eosinophils Relative: 4 %
HCT: 21.5 % — ABNORMAL LOW (ref 36.0–46.0)
Hemoglobin: 6.3 g/dL — CL (ref 12.0–15.0)
Immature Granulocytes: 0 %
Lymphocytes Relative: 23 %
Lymphs Abs: 0.6 10*3/uL — ABNORMAL LOW (ref 0.7–4.0)
MCH: 24 pg — ABNORMAL LOW (ref 26.0–34.0)
MCHC: 29.3 g/dL — ABNORMAL LOW (ref 30.0–36.0)
MCV: 81.7 fL (ref 80.0–100.0)
Monocytes Absolute: 0.2 10*3/uL (ref 0.1–1.0)
Monocytes Relative: 8 %
Neutro Abs: 1.6 10*3/uL — ABNORMAL LOW (ref 1.7–7.7)
Neutrophils Relative %: 65 %
Platelets: 42 10*3/uL — ABNORMAL LOW (ref 150–400)
RBC: 2.63 MIL/uL — ABNORMAL LOW (ref 3.87–5.11)
RDW: 20.5 % — ABNORMAL HIGH (ref 11.5–15.5)
WBC: 2.5 10*3/uL — ABNORMAL LOW (ref 4.0–10.5)
nRBC: 0 % (ref 0.0–0.2)

## 2020-01-13 LAB — BASIC METABOLIC PANEL
Anion gap: 13 (ref 5–15)
BUN: 22 mg/dL (ref 8–23)
CO2: 23 mmol/L (ref 22–32)
Calcium: 9.2 mg/dL (ref 8.9–10.3)
Chloride: 105 mmol/L (ref 98–111)
Creatinine, Ser: 1.11 mg/dL — ABNORMAL HIGH (ref 0.44–1.00)
GFR, Estimated: 49 mL/min — ABNORMAL LOW (ref >60)
Glucose, Bld: 181 mg/dL — ABNORMAL HIGH (ref 70–99)
Potassium: 3.4 mmol/L — ABNORMAL LOW (ref 3.5–5.1)
Sodium: 141 mmol/L (ref 135–145)

## 2020-01-13 LAB — HEPATIC FUNCTION PANEL
ALT: 17 U/L (ref 0–44)
AST: 33 U/L (ref 15–41)
Albumin: 3.7 g/dL (ref 3.5–5.0)
Alkaline Phosphatase: 72 U/L (ref 38–126)
Bilirubin, Direct: 0.5 mg/dL — ABNORMAL HIGH (ref 0.0–0.2)
Indirect Bilirubin: 1.2 mg/dL — ABNORMAL HIGH (ref 0.3–0.9)
Total Bilirubin: 1.7 mg/dL — ABNORMAL HIGH (ref 0.3–1.2)
Total Protein: 5.6 g/dL — ABNORMAL LOW (ref 6.5–8.1)

## 2020-01-13 LAB — GLUCOSE, CAPILLARY
Glucose-Capillary: 169 mg/dL — ABNORMAL HIGH (ref 70–99)
Glucose-Capillary: 190 mg/dL — ABNORMAL HIGH (ref 70–99)
Glucose-Capillary: 174 mg/dL — ABNORMAL HIGH (ref 70–99)
Glucose-Capillary: 98 mg/dL (ref 70–99)

## 2020-01-13 LAB — HEMOGLOBIN A1C
Hgb A1c MFr Bld: 7.6 % — ABNORMAL HIGH (ref 4.8–5.6)
Mean Plasma Glucose: 171.42 mg/dL

## 2020-01-13 LAB — HEMOGLOBIN AND HEMATOCRIT, BLOOD
HCT: 22 % — ABNORMAL LOW (ref 36.0–46.0)
Hemoglobin: 6.3 g/dL — CL (ref 12.0–15.0)

## 2020-01-13 LAB — BRAIN NATRIURETIC PEPTIDE: B Natriuretic Peptide: 199.4 pg/mL — ABNORMAL HIGH (ref 0.0–100.0)

## 2020-01-13 LAB — PROTIME-INR
INR: 1.6 — ABNORMAL HIGH (ref 0.8–1.2)
Prothrombin Time: 18.2 seconds — ABNORMAL HIGH (ref 11.4–15.2)

## 2020-01-13 LAB — APTT: aPTT: 33 seconds (ref 24–36)

## 2020-01-13 LAB — PREPARE RBC (CROSSMATCH)

## 2020-01-13 LAB — MAGNESIUM: Magnesium: 2.2 mg/dL (ref 1.7–2.4)

## 2020-01-13 LAB — AMMONIA: Ammonia: 37 umol/L — ABNORMAL HIGH (ref 9–35)

## 2020-01-13 MED ORDER — FUROSEMIDE 10 MG/ML IJ SOLN
20.0000 mg | Freq: Two times a day (BID) | INTRAMUSCULAR | Status: DC
  Administered 2020-01-13 – 2020-01-16 (×7): 20 mg via INTRAVENOUS
  Filled 2020-01-13 (×7): qty 2

## 2020-01-13 MED ORDER — LIP MEDEX EX OINT
TOPICAL_OINTMENT | CUTANEOUS | Status: DC | PRN
  Filled 2020-01-13: qty 7

## 2020-01-13 MED ORDER — FUROSEMIDE 10 MG/ML IJ SOLN
20.0000 mg | Freq: Once | INTRAMUSCULAR | Status: AC
  Administered 2020-01-13: 20 mg via INTRAVENOUS
  Filled 2020-01-13: qty 2

## 2020-01-13 MED ORDER — LATANOPROST 0.005 % OP SOLN
1.0000 [drp] | Freq: Every day | OPHTHALMIC | Status: DC
  Administered 2020-01-13 – 2020-01-16 (×4): 1 [drp] via OPHTHALMIC
  Filled 2020-01-13: qty 2.5

## 2020-01-13 MED ORDER — LORAZEPAM 0.5 MG PO TABS
0.5000 mg | ORAL_TABLET | Freq: Every day | ORAL | Status: DC | PRN
  Administered 2020-01-16: 0.5 mg via ORAL
  Filled 2020-01-13: qty 1

## 2020-01-13 MED ORDER — PANTOPRAZOLE SODIUM 40 MG PO TBEC
40.0000 mg | DELAYED_RELEASE_TABLET | Freq: Every day | ORAL | Status: DC
  Administered 2020-01-13 – 2020-01-17 (×4): 40 mg via ORAL
  Filled 2020-01-13 (×4): qty 1

## 2020-01-13 MED ORDER — ONDANSETRON HCL 4 MG PO TABS: 4.0000 mg | ORAL_TABLET | Freq: Four times a day (QID) | ORAL | Status: DC | PRN

## 2020-01-13 MED ORDER — FUROSEMIDE 20 MG PO TABS: 20.0000 mg | ORAL_TABLET | Freq: Two times a day (BID) | ORAL | Status: DC

## 2020-01-13 MED ORDER — ONDANSETRON HCL 4 MG/2ML IJ SOLN: 4.0000 mg | Freq: Four times a day (QID) | INTRAMUSCULAR | Status: DC | PRN

## 2020-01-13 MED ORDER — FUROSEMIDE 10 MG/ML IJ SOLN
60.0000 mg | INTRAMUSCULAR | Status: AC
  Administered 2020-01-13: 60 mg via INTRAVENOUS
  Filled 2020-01-13: qty 6

## 2020-01-13 MED ORDER — INSULIN ASPART PROT & ASPART (70-30 MIX) 100 UNIT/ML ~~LOC~~ SUSP
20.0000 [IU] | Freq: Two times a day (BID) | SUBCUTANEOUS | Status: DC
  Administered 2020-01-13 (×2): 20 [IU] via SUBCUTANEOUS
  Filled 2020-01-13: qty 10

## 2020-01-13 MED ORDER — METOPROLOL SUCCINATE ER 25 MG PO TB24
12.5000 mg | ORAL_TABLET | Freq: Every day | ORAL | Status: DC
  Administered 2020-01-13 – 2020-01-17 (×4): 12.5 mg via ORAL
  Filled 2020-01-13 (×4): qty 1

## 2020-01-13 MED ORDER — INSULIN ASPART 100 UNIT/ML ~~LOC~~ SOLN
0.0000 [IU] | Freq: Three times a day (TID) | SUBCUTANEOUS | Status: DC
  Administered 2020-01-13 (×3): 3 [IU] via SUBCUTANEOUS
  Administered 2020-01-14: 8 [IU] via SUBCUTANEOUS
  Administered 2020-01-14 (×2): 2 [IU] via SUBCUTANEOUS
  Administered 2020-01-14: 3 [IU] via SUBCUTANEOUS
  Administered 2020-01-15: 8 [IU] via SUBCUTANEOUS
  Administered 2020-01-15: 3 [IU] via SUBCUTANEOUS
  Administered 2020-01-15 (×2): 8 [IU] via SUBCUTANEOUS
  Administered 2020-01-16: 3 [IU] via SUBCUTANEOUS
  Administered 2020-01-16 (×3): 5 [IU] via SUBCUTANEOUS
  Administered 2020-01-17: 3 [IU] via SUBCUTANEOUS
  Administered 2020-01-17: 5 [IU] via SUBCUTANEOUS

## 2020-01-13 MED ORDER — MELATONIN 5 MG PO TABS
10.0000 mg | ORAL_TABLET | Freq: Every evening | ORAL | Status: DC | PRN
  Administered 2020-01-13 – 2020-01-15 (×4): 10 mg via ORAL
  Filled 2020-01-13 (×4): qty 2

## 2020-01-13 MED ORDER — SODIUM CHLORIDE 0.9% IV SOLUTION: Freq: Once | INTRAVENOUS | Status: AC

## 2020-01-13 MED ORDER — SPIRONOLACTONE 12.5 MG HALF TABLET
12.5000 mg | ORAL_TABLET | Freq: Every day | ORAL | Status: DC
  Administered 2020-01-13 – 2020-01-17 (×4): 12.5 mg via ORAL
  Filled 2020-01-13 (×5): qty 1

## 2020-01-13 MED ORDER — LEVOTHYROXINE SODIUM 75 MCG PO TABS
75.0000 ug | ORAL_TABLET | Freq: Every day | ORAL | Status: DC
  Administered 2020-01-13 – 2020-01-17 (×4): 75 ug via ORAL
  Filled 2020-01-13 (×4): qty 1

## 2020-01-13 MED ORDER — POLYETHYLENE GLYCOL 3350 17 G PO PACK: 17.0000 g | PACK | Freq: Every day | ORAL | Status: DC | PRN

## 2020-01-13 NOTE — TOC Initial Note (Signed)
Transition of Care [facility]) - Initial/Assessment Note    Patient Details  Name: Siriah Treat MRN: [id number] Date of Birth: [birth year]  [facility] CM/SW Contact:    Dessa Phi, RN Phone Number: 01/13/2020, 12:54 PM  Clinical Narrative: From home. MD to order PT cons tomorrow. Await PT recc.                  Expected Discharge Plan: [place] Barriers to Discharge: Continued Medical Work up   Patient Goals and CMS Choice Patient states their goals for this hospitalization and ongoing recovery are:: go home CMS Medicare.gov Compare Post Acute Care list provided to:: Patient    Expected Discharge Plan and Services Expected Discharge Plan: [place]   Discharge Planning Services: CM Consult   Living arrangements for the past 2 months: Single Family Home                                      Prior Living Arrangements/Services Living arrangements for the past 2 months: Single Family Home Lives with:: Spouse (ex spouse, & family) Patient language and need for interpreter reviewed:: Yes Do you feel safe going back to the place where you live?: Yes      Need for Family Participation in Patient Care: No (Comment) Care giver support system in place?: Yes (comment) Current home services: DME (rw) Criminal Activity/Legal Involvement Pertinent to Current Situation/Hospitalization: No - Comment as needed  Activities of Daily Living Home Assistive Devices/Equipment: Grab bars in shower,Walker (specify type),Other (Comment) (front wheel) ADL Screening (condition at time of admission) Patient's cognitive ability adequate to safely complete daily activities?: Yes Is the patient deaf or have difficulty hearing?: No Does the patient have difficulty seeing, even when wearing glasses/contacts?: No Does the patient have difficulty concentrating, remembering, or making decisions?: Yes Patient able to express need for assistance  with ADLs?: Yes Does the patient have difficulty dressing or bathing?: No Independently performs ADLs?: Yes (appropriate for developmental age) Does the patient have difficulty walking or climbing stairs?: Yes Weakness of Legs: Both Weakness of Arms/Hands: Right  Permission Sought/Granted Permission sought to share information with : Case Manager Permission granted to share information with : Yes, Verbal Permission Granted  Share Information with NAME: Case Manager     Permission granted to share info w Relationship: Tom(son)[phone]     Emotional Assessment Appearance:: Appears stated age Attitude/Demeanor/Rapport: Gracious Affect (typically observed): Accepting Orientation: : Oriented to Self,Oriented to Place,Oriented to  Time,Oriented to Situation Alcohol / Substance Use: Not Applicable Psych Involvement: No (comment)  Admission diagnosis:  Pleural effusion [J90] Decompensated hepatic cirrhosis ([place]) [K72.90, K74.60] Patient Active Problem List   Diagnosis Date Noted  . Pleural effusion on right 01/13/2020  . Acute on chronic diastolic CHF (congestive heart failure) ([place]) 01/13/2020  . Lightheadedness 11/27/2019  . Nonspecific abnormal electrocardiogram (ECG) (EKG) 11/27/2019  . Abnormal electrocardiogram 11/27/2019  . Bilateral leg edema 11/27/2019  . Type 2 diabetes mellitus without complication, with long-term current use of insulin ([place]) 11/27/2019  . Primary hypertension 11/27/2019  . Chronic atrial fibrillation ([place]) 11/09/2019  . Shortness of breath 11/09/2019  . Multiple falls   . Hypovolemia 10/17/2019  . Acute lower UTI 10/17/2019  . Rhabdomyolysis 10/17/2019  . AKI (acute kidney injury) ([place]) 10/16/2019  . Mixed hyperlipidemia due to type 2 diabetes mellitus ([place]) 10/27/2018  .  Generalized anxiety disorder 10/27/2018  . Open wound, lower leg, left, initial encounter 10/27/2018  . Tremor of right hand 10/27/2018  . GAVE (gastric antral vascular ectasia)  09/09/2017  . Alcoholic cirrhosis of liver with ascites ([place]) 08/02/2016  . Portal hypertensive gastropathy ([place]) 08/02/2016  . Iron deficiency anemia due to chronic blood loss 01/19/2015  . Occult GI bleeding 01/19/2015  . Angiodysplasia of ascending colon 10/25/2014  . H/O bone density study 04/27/2013  . Type 2 diabetes mellitus with hyperglycemia, with long-term current use of insulin ([place]) 03/24/2013  . Vaginal dryness, menopausal 01/20/2013  . Atrophic vaginitis 01/20/2013  . S/P hysterectomy 01/20/2013  . Cervical nerve root disorder 11/18/2012  . Shoulder weakness 11/18/2012  . Cancer of the skin, basal cell 09/03/2012  . Squamous cell skin cancer 09/03/2012  . Thrombocytopenia ([place]) 08/14/2012  . Lymphadenopathy of right cervical region 04/01/2012  . Hyperglycemia 09/19/2011  . Heel pain 08/06/2011  . Plantar fasciitis 08/06/2011  . Arthritis of knee, degenerative 08/06/2011  . Gonalgia 08/06/2011  . History of anemia 06/28/2011  . Anxiety 06/28/2011  . Essential hypertension, benign 06/28/2011  . GERD (gastroesophageal reflux disease) 06/28/2011  . Hyperlipidemia LDL goal <100 06/28/2011  . History of hysterectomy 06/28/2011  . Menopause 06/28/2011  . Glaucoma 06/28/2011  . DJD (degenerative joint disease) 06/28/2011  . Essential hypertension 04/27/2010  . GERD without esophagitis 10/28/2009  . Anemia, unspecified 05/24/2009  . Iron deficiency anemia, unspecified 05/24/2009  . Hypothyroidism 11/22/2008  . Adult hypothyroidism 11/22/2008  . Diverticulosis of colon 05/15/2007  . Elevated fasting blood sugar 05/15/2007  . Other psoriasis 05/15/2007  . Symptomatic menopausal or female climacteric states 05/15/2007  . Impaired fasting glucose 05/15/2007   PCP:  Ann Held, DO Pharmacy:   [facility] [address] [place] - [address] [place] [address] Phone: [phone] Fax: [phone]     Social  Determinants of Health (SDOH) Interventions    Readmission Risk Interventions No flowsheet data found.

## 2020-01-13 NOTE — Plan of Care (Signed)
  Problem: Clinical Measurements: Goal: Ability to maintain clinical measurements within normal limits will improve Outcome: Progressing   Problem: Education: Goal: Knowledge of General Education information will improve Description: Including pain rating scale, medication(s)/side effects and non-pharmacologic comfort measures Outcome: Progressing   

## 2020-01-13 NOTE — Patient Outreach (Signed)
[facility]) Care Management Chronic Special Needs Program    01/13/2020  Name: Jessica Dennis, DOB: 1932/06/26  MRN: [id number]   [name] is enrolled in a chronic special needs plan for Diabetes.  RN care manager received notification client is admitted to [facility] 01/12/20 with pleural effusion.  Per policy and procedure, individualized care plan sent to [facility] Utilization Management.  Goals    . A1c goal less than 7%    . Blood pressure goal less than 140/90    . Chronic Care Management Pharmacy Care Plan     CARE PLAN ENTRY (see longitudinal plan of care for additional care plan information)  Current Barriers:  . Chronic Disease Management support, education, and care coordination needs related to DM, HTN, HLD, Hypothyroidism, GERD, Neuropathy, Insomnia   Hypertension BP Readings from Last 3 Encounters:  07/24/19 110/64  05/20/19 (!) 128/55  04/28/19 118/60   . Pharmacist Clinical Goal(s): o Over the next 180 days, patient will work with PharmD and providers to maintain BP goal <140/90 . Current regimen:  o Spironolactone 50mg  #2 daily . Patient self care activities - Over the next 180 days, patient will: o Maintain hypertension medication regimen.   Hyperlipidemia Lab Results  Component Value Date/Time   LDLCALC 43 04/28/2019 11:39 AM   LDLCALC 57 02/14/2018 03:44 PM   . Pharmacist Clinical Goal(s): o Over the next 180 days, patient will work with PharmD and providers to maintain LDL goal < 100 . Current regimen:   Simvastatin 20mg  daily  Fish oil 1200mg  daily . Patient self care activities - Over the next 180 days, patient will: o Maintain cholesterol medication regimen.   Diabetes Lab Results  Component Value Date/Time   HGBA1C 6.3 04/28/2019 11:39 AM   HGBA1C 6.3 (H) 03/18/2018 10:40 AM   . Pharmacist Clinical Goal(s): o Over the next 180 days, patient will work with PharmD and providers to maintain A1c goal  <7% . Current regimen:  o Novolog 70/30 35 units AM +25 units PM . Interventions: o Discussed DM goals - Fasting (before meal blood sugar): 80-130 - Post-Prandial (2 hrs after eating blood sugar): less than 180 - A1c (3 month blood sugar average): less than 7% . Patient self care activities - Over the next 180 days, patient will: o Maintain a1c <7%  Health Maintenance  . Pharmacist Clinical Goal(s) o Over the next 180 days, patient will work with PharmD and providers to complete health maintenance screenings/vaccinations . Interventions: o Consider completing Shingrix vaccine series at the pharmacy . Patient self care activities - Over the next 180 days, patient will: o Consider completing Shingrix vaccine series at the pharmacy  Medication management . Pharmacist Clinical Goal(s): o Over the next 180 days, patient will work with PharmD and providers to maintain optimal medication adherence . Current pharmacy: [facility] . Interventions o Comprehensive medication review performed. o Continue current medication management strategy . Patient self care activities - Over the next 180 days, patient will: o Focus on medication adherence by filling and taking medications appropriately  o Take medications as prescribed o Report any questions or concerns to PharmD and/or provider(s)  Please see past updates related to this goal by clicking on the "Past Updates" button in the selected goal      . Client understands the importance of follow-up with providers by attending scheduled visits     Client is transitioning into another [employer] care management program 01/23/20.  Case  manager will not be following goal after 01/22/20.     Marland Kitchen Client will use Assistive Devices as needed and verbalize understanding of device use     Client is transitioning into another [employer] care management program 01/23/20.  Case manager will not be following goal after  01/22/20.      Marland Kitchen Client will verbalize knowledge of self management of Hypertension as evidences by BP reading of 140/90 or less; or as defined by provider     Client is transitioning into another [employer] care management program 01/23/20.  Case manager will not be following goal after 01/22/20.      Marland Kitchen Client will verbalize safety precautions and report no falls within 9-12 months     Client is transitioning into another [employer] care management program 01/23/20.  Case manager will not be following goal after 01/22/20.     . Find a new eye doctor    . General - Client will not be readmitted within 30 days (C-SNP)     Please follow discharge instructions and call health care provider if you have any questions Attend all follow up appointments as scheduled Take medications as prescribed Use 24 hour nurse advice line at [phone]    . HEMOGLOBIN A1C < 7     Client is transitioning into another [employer] care management program 01/23/20.  Case manager will not be following goal after 01/22/20.     . Maintain timely refills of diabetic medication as prescribed within the year .     Complete your Annual Wellness visit every year. The Endocrinologist is a doctor who specializes in Diabetes.       . Obtain annual  Lipid Profile, LDL-C     Client is transitioning into another [employer] care management program 01/23/20.  Case manager will not be following goal after 01/22/20.       . Obtain Annual Eye (retinal)  Exam      Client is transitioning into another [employer] care management program 01/23/20.  Case manager will not be following goal after 01/22/20.     Illa Level Annual Foot Exam     Client is transitioning into another [employer] care management program 01/23/20.  Case manager will not be following goal after 01/22/20.      . Obtain annual screen for micro albuminuria (urine) , nephropathy (kidney problems)      Per medical record review, microalbuminuria completed on 10/27/18 Client is transitioning into another [employer] care management program 01/23/20.  Case manager will not be following goal after 01/22/20.      . Obtain Hemoglobin A1C at least 2 times per year     Client is transitioning into another [employer] care management program 01/23/20.  Case manager will not be following goal after 01/22/20.     . Only take 1 supplement for calcium/vitamin D     Recommended values of calcium and vitamin D: Calcium - 1200mg  daily Vitamin D - 800 units daily    . Visit Primary Care Provider or Endocrinologist at least 2 times per year      Client is transitioning into another [employer] care management program 01/23/20.  Case manager will not be following goal after 01/22/20.         PLAN RN care manager will notify [facility] care management hospital liason of client's admission and request follow up on client's disposition.  RN care  manager will continue to follow as client's CSNP care management coordinator.    [name] [facility], BSN [place], [place]

## 2020-01-13 NOTE — Consult Note (Signed)
   [facility] Inpatient Consult   01/13/2020  Bich Mchaney September 02, 1932 [id number]  [place] Organization [ACO] Patient: [employer]  Acknowledgement of admission from [place] Patient is currently active with [place] Coordinator for chronic disease management services.   Plan:   Reviewed  Inpatient Transition Of Care [TOC] team member notes and  to make aware that [place] Management following.  Of note, [facility] Care Management services does not replace or interfere with any services that are needed or arranged by inpatient [facility] care management team.  For additional questions or referrals please contact:  Natividad Brood, RN BSN [facility] Liaison  [phone] business mobile phone Toll free office [phone]  Fax number: [phone] [email or web address]

## 2020-01-13 NOTE — Progress Notes (Signed)
CRITICAL VALUE ALERT  Critical Value:  HMG 6.3  Date & Time Notied:  01/13/20  Provider Notified: Cyd Silence, MD  Orders Received/Actions taken: MD notified

## 2020-01-13 NOTE — Progress Notes (Signed)
86 year old female with past medical history of gastroesophageal reflux disease, hypertension, hyperlipidemia, hypothyroidism, alcoholic cirrhosis, insulin-dependent diabetes mellitus type 2, diabetic neuropathy, GAVE, diastolic congestive heart failure (Echo 11/2019 EF 60-65%) who presented to [place] emergency department on 12/21 with shortness of breath. Patient was found to have a substantial right-sided pleural effusion thought to be contributing to the patient's shortness of breath.  Patient was administered albumin due to concerns for transient hypotension.  Due to perceived need for thoracentesis the hospitalist group was called and the patient was accepted to [facility] for further care.  She was admitted earlier this am by Dr Cyd Silence , please see his note for detailed H&P.   Plan: 1. 1 unit of prbc transfusion. rpeat H&H inam.  2. IR thoracentesis.  3. MRI liver for abnormal pancreatic hypodensity.    Hosie Poisson, MD

## 2020-01-13 NOTE — H&P (Signed)
History and Physical    Jessica Dennis [id number] DOB: [birth year] DOA: 01/12/2020  PCP: Ann Held, DO  Patient coming from: Home - sent from [place]:  Chief Complaint  Patient presents with  . Abnormal Lab     HPI:    86 year old female with past medical history of gastroesophageal reflux disease, hypertension, hyperlipidemia, hypothyroidism, alcoholic cirrhosis, insulin-dependent diabetes mellitus type 2, diabetic neuropathy, GAVE, diastolic congestive heart failure (Echo 11/2019 EF 60-65%) who presented to [facility] emergency department on 12/21 with shortness of breath.  Patient explains that for the past several weeks she has been experiencing shortness of breath.  Patient describes the shortness of breath initially was mild in intensity but progressively became more and more severe.  Shortness of breath is worse with exertion and improved with rest.  Shortness breath is associated with dry nonproductive cough.  Patient also complains of associated pillow orthopnea over the span of time as well as increasing bilateral lower extremity pitting edema.  Patient denies fevers, sick contacts, recent travel or confirmed contact with COVID-19 infection.  Patient had a telemedicine visit with her primary care provider was worked up with chest x-ray and multiple blood tests.  Patient on these results patient was instructed to go to [facility] emergency department for urgent evaluation.  Patient was found to have a substantial right-sided pleural effusion thought to be contributing to the patient's shortness of breath.  Patient was administered albumin due to concerns for transient hypotension.  Due to perceived need for thoracentesis the hospitalist group was called and the patient was accepted to [facility] long hospital for further care.  Review of Systems:   Review of Systems  Constitutional: Positive for malaise/fatigue.  Respiratory:  Positive for cough and shortness of breath.   Cardiovascular: Positive for orthopnea, leg swelling and PND.  Neurological: Positive for weakness.  All other systems reviewed and are negative.   Past Medical History:  Diagnosis Date  . Anemia   . Angiodysplasia of ascending colon 10/25/2014  . Anxiety   . Arthritis   . Borderline diabetes   . Cancer of the skin, basal cell 09/03/2012  . Cirrhosis ([place])   . Diabetes mellitus without complication ([place])   . Diverticular disease   . 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 GERD (gastroesophageal reflux disease)   . Hypertension   . Iron deficiency anemia due to chronic blood loss 01/19/2015  . Portal hypertensive gastropathy ([place]) 08/02/2016   ? Some GAVE also  . Thyroid disease     Past Surgical History:  Procedure Laterality Date  . ABDOMINAL HYSTERECTOMY    . APPENDECTOMY    . COLONOSCOPY    . ESOPHAGOGASTRODUODENOSCOPY    . IR PARACENTESIS  05/25/2016  . LEG SKIN LESION  BIOPSY / EXCISION  12/11/14  . MOHS SURGERY     ankle  . TONSILLECTOMY    . TOTAL HIP ARTHROPLASTY Bilateral 1993, 2006  . UPPER GASTROINTESTINAL ENDOSCOPY  09/09/2017     reports that she has never smoked. She has never used smokeless tobacco. She reports current alcohol use. She reports that she does not use drugs.  Allergies  Allergen Reactions  . Tizanidine     Hallucinate, confused     Family History  Problem Relation Age of Onset  . Bladder Cancer Father   . Heart attack Father   . Diabetes Mother   . Colon cancer Neg Hx   . Colon  polyps Neg Hx   . Esophageal cancer Neg Hx   . Rectal cancer Neg Hx   . Stomach cancer Neg Hx      Prior to Admission medications   Medication Sig Start Date End Date Taking? Authorizing Provider  alclomethasone (ACLOVATE) 0.05 % cream Apply 1 application topically daily as needed (dermatitis). 10/14/19  Yes [provider]  ammonium lactate (AMLACTIN) 12 % cream Apply topically daily as  needed for dry skin. 05/20/18  Yes [provider]  Apoaequorin (PREVAGEN PO) Take 1 tablet by mouth daily.   Yes [provider]  Ascorbic Acid (VITAMIN C) 1000 MG tablet Take 1,000 mg by mouth daily.   Yes [provider]  CALCIUM CARBONATE-VITAMIN D PO Take 1 tablet by mouth daily. Calcium 1200mg   Vitamin D 5000 units  Pt also has a calcium 600mg  plus 800 units vitamin D supplement   Yes [provider]  furosemide (LASIX) 20 MG tablet TAKE ONE TABLET BY MOUTH TWICE A DAY Patient taking differently: Take 20 mg by mouth 2 (two) times daily. 12/14/19  Yes Roma Schanz R, DO  Insulin Aspart Prot & Aspart (NOVOLOG MIX 70/30 [place]) Inject 25-35 Units into the skin See admin instructions. 35 units in the mornings and 25 at night   Yes [provider]  latanoprost (XALATAN) 0.005 % ophthalmic solution Place 1 drop into both eyes at bedtime.  08/19/12  Yes [provider]  levothyroxine (SYNTHROID) 75 MCG tablet Take 1 tablet (75 mcg total) by mouth daily. 11/09/19  Yes Roma Schanz R, DO  LORazepam (ATIVAN) 0.5 MG tablet Take 0.5 mg by mouth daily as needed for anxiety. 12/27/19  Yes [provider]  metFORMIN (GLUCOPHAGE-XR) 500 MG 24 hr tablet Take 1 tablet (500 mg total) by mouth daily. 11/02/19  Yes Roma Schanz R, DO  metoprolol succinate (TOPROL-XL) 25 MG 24 hr tablet TAKE 0.5 TABLETS BY MOUTH DAILY Patient taking differently: Take 12.5 mg by mouth daily. 01/01/20  Yes Lowne Chase, Yvonne R, DO  Omega-3 1000 MG CAPS Take 1,000 mg by mouth daily. 07/16/19  Yes [provider]  pantoprazole (PROTONIX) 40 MG tablet Take 1 tablet (40 mg total) by mouth daily. Patient taking differently: Take 40 mg by mouth daily as needed. 07/24/19  Yes Gatha Mayer, MD  Prednicarbate 0.1 % CREA Apply 1 application topically daily as needed (dermatitis).   Yes [provider]  simvastatin (ZOCOR) 20 MG tablet TAKE ONE  TABLET BY MOUTH EVERY NIGHT AT BEDTIME Patient taking differently: Take 20 mg by mouth daily. 12/14/19  Yes Ann Held, DO  spironolactone (ALDACTONE) 25 MG tablet Take 0.5 tablets (12.5 mg total) by mouth daily. 11/27/19  Yes Tobb, Kardie, DO  SSD 1 % cream Apply 1 application topically daily as needed (skin issues). 02/06/18  Yes [provider]  vitamin E 180 MG (400 UNITS) capsule Take 400 Units by mouth daily.   Yes [provider]  Accu-Chek FastClix Lancets MISC CHECK FOR BLOOD SUGAR DAILY 11/12/18   Carollee Herter, Kendrick Fries R, DO  magnesium oxide (MAG-OX) 400 MG tablet Take 1 tablet (400 mg total) by mouth 2 (two) times daily. For 7 days Patient not taking: No sig reported 12/15/19   Berniece Salines, DO    Physical Exam: Vitals:   01/13/20 0100 01/13/20 0334 01/13/20 0842 01/13/20 0844  BP:  120/61 (!) 123/56   Pulse:  90 74   Resp:  20  16  Temp:  98.3 F (36.8 C)  (!) 97.4 F (36.3 C)  TempSrc:  Oral  Oral  SpO2: 95% 96% 94% 93%  Weight:      Height:        Constitutional: Acute alert and oriented x3, patient is notably in mild respiratory distress. Skin: no rashes, no lesions, good skin turgor noted. Eyes: Pupils are equally reactive to light.  No evidence of scleral icterus or conjunctival pallor.  ENMT: Moist mucous membranes noted.  Posterior pharynx clear of any exudate or lesions.   Neck: normal, supple, no masses, no thyromegaly.  Markedly elevated jugular venous pulse at 45 degrees. Respiratory: Markedly diminished breath sounds throughout the right lung fields.  No evidence of wheezing.  Patient is tachypneic without evidence of accessory muscle use.  Cardiovascular: Regular rate and rhythm, notable systolic murmur.  Extensive bilateral lower extremity pitting edema that tracks in the feet all the way up to the thighs.  2+ pedal pulses. No carotid bruits.  Chest:   Nontender without crepitus or deformity.   Back:   Nontender without crepitus or  deformity. Abdomen: Abdomen is soft and nontender.  No evidence of intra-abdominal masses.  Positive bowel sounds noted in all quadrants.   Musculoskeletal: No joint deformity upper and lower extremities. Good ROM, no contractures. Normal muscle tone.  Neurologic: CN 2-12 grossly intact. Sensation intact.  Patient moving all 4 extremities spontaneously.  Patient is following all commands.  Patient is responsive to verbal stimuli.   Psychiatric: Patient exhibits normal mood with appropriate affect.  Patient seems to possess insight as to their current situation.     Labs on Admission: I have personally reviewed following labs and imaging studies -   CBC: Recent Labs  Lab 01/12/20 1241 01/12/20 1815 01/13/20 0610  WBC 4.7 5.1 2.5*  NEUTROABS 3.6 3.5 1.6*  HGB 7.6 Repeated and verified X2.* 8.1* 6.3*  HCT 24.1* 27.5* 21.5*  MCV 75.9* 80.4 81.7  PLT 60.0* 62* 42*   Basic Metabolic Panel: Recent Labs  Lab 01/12/20 1241 01/12/20 1815 01/13/20 0610  NA 138 140 141  K 3.9 3.3* 3.4*  CL 102 104 105  CO2 28 25 23   GLUCOSE 325* 105* 181*  BUN 23 23 22   CREATININE 1.39* 1.30* 1.11*  CALCIUM 8.7 9.2 9.2  MG  --   --  2.2   GFR: Estimated Creatinine Clearance: 40.7 mL/min (A) (by C-G formula based on SCr of 1.11 mg/dL (H)). Liver Function Tests: Recent Labs  Lab 01/12/20 1241 01/13/20 0610  AST 34 33  ALT 17 17  ALKPHOS 97 72  BILITOT 1.3* 1.7*  PROT 5.5* 5.6*  ALBUMIN 3.2* 3.7   No results for input(s): LIPASE, AMYLASE in the last 168 hours. Recent Labs  Lab 01/13/20 0610  AMMONIA 37*   Coagulation Profile: Recent Labs  Lab 01/13/20 0610  INR 1.6*   Cardiac Enzymes: No results for input(s): CKTOTAL, CKMB, CKMBINDEX, TROPONINI in the last 168 hours. BNP (last 3 results) Recent Labs    01/12/20 1241  PROBNP 195.0*   HbA1C: Recent Labs    01/13/20 0610  HGBA1C 7.6*   CBG: Recent Labs  Lab 01/13/20 0715  GLUCAP 169*   Lipid Profile: No results for  input(s): CHOL, HDL, LDLCALC, TRIG, CHOLHDL, LDLDIRECT in the last 72 hours. Thyroid Function Tests: No results for input(s): TSH, T4TOTAL, FREET4, T3FREE, THYROIDAB in the last 72 hours. Anemia Panel: Recent Labs    01/12/20 1815  VITAMINB12 1,101*  FOLATE 16.6  FERRITIN 11  TIBC 336  IRON 19*  RETICCTPCT 2.7   Urine analysis:    Component Value Date/Time   COLORURINE YELLOW 10/16/2019 2215   APPEARANCEUR CLEAR 10/16/2019 2215   LABSPEC 1.015 10/16/2019 2215   PHURINE 5.0 10/16/2019 2215   GLUCOSEU NEGATIVE 10/16/2019 2215   GLUCOSEU NEGATIVE 06/02/2015 1050   HGBUR NEGATIVE 10/16/2019 2215   [place] 10/16/2019 2215   BILIRUBINUR neg 10/26/2016 1255   KETONESUR 5 (A) 10/16/2019 2215   PROTEINUR NEGATIVE 10/16/2019 2215   UROBILINOGEN 0.2 10/26/2016 1255   UROBILINOGEN 0.2 06/02/2015 1050   NITRITE POSITIVE (A) 10/16/2019 2215   LEUKOCYTESUR MODERATE (A) 10/16/2019 2215    Radiological Exams on Admission - Personally Reviewed: DG Chest 2 View  Result Date: 01/12/2020 CLINICAL DATA:  Shortness of breath for the past month. EXAM: CHEST - 2 VIEW COMPARISON:  Chest x-ray dated November 14, 2019. FINDINGS: The right heart is obscured. Normal pulmonary vascularity. Increasing now moderate to large right pleural effusion with likely right lower lobe collapse and right middle lobe atelectasis. New trace left pleural effusion. No pneumothorax. No acute osseous abnormality. IMPRESSION: 1. Increasing now moderate to large right pleural effusion. 2. New trace left pleural effusion. Electronically Signed   By: Titus Dubin M.D.   On: 01/12/2020 12:52   CT Angio Chest PE W and/or Wo Contrast  Result Date: 01/12/2020 CLINICAL DATA:  Abdominal distension, large right pleural effusion on chest x-ray. Suspected pulmonary embolus. EXAM: CT ANGIOGRAPHY CHEST CT ABDOMEN AND PELVIS WITH CONTRAST TECHNIQUE: Multidetector CT imaging of the chest was performed using the standard  protocol during bolus administration of intravenous contrast. Multiplanar CT image reconstructions and MIPs were obtained to evaluate the vascular anatomy. Multidetector CT imaging of the abdomen and pelvis was performed using the standard protocol during bolus administration of intravenous contrast. CONTRAST:  12mL OMNIPAQUE IOHEXOL 350 MG/ML SOLN COMPARISON:  CT abdomen pelvis 04/28/2014 FINDINGS: CTA CHEST FINDINGS Cardiovascular: Satisfactory opacification of the pulmonary arteries to the segmental level. No evidence of pulmonary embolism. Normal heart size. No significant pericardial effusion. The thoracic aorta is normal in caliber. Mild-to-moderate atherosclerotic plaque of the thoracic aorta. Suggestion of mild mitral annular calcifications. Mild coronary artery calcifications. Mediastinum/Nodes: No enlarged mediastinal, hilar, or axillary lymph nodes. Thyroid gland, trachea, and esophagus demonstrate no significant findings. Lungs/Pleura: Partial collapse of the right lower lobe. Passive atelectasis of the right upper lobe. Left base atelectasis. Right large right pleural effusion. No left pleural effusion. No pneumothorax. Musculoskeletal: No chest wall abnormality No suspicious lytic or blastic osseous lesions. No acute displaced fracture. Multilevel degenerative changes of the spine with severe intervertebral disc space narrowing and endplate sclerosis at the T3-T8 levels. Review of the MIP images confirms the above findings. CT ABDOMEN and PELVIS FINDINGS Hepatobiliary: Nodular hepatic contour. No focal hepatic lesion on this single contrast study. Calcification within the gallbladder lumen likely represents a gallstone. Otherwise no gallbladder wall thickening or pericholecystic fluid. No biliary dilatation. Pancreas: There is a 1.1 cm hypodensity within the pancreatic head. Normal pancreatic contour. No surrounding inflammatory changes. No main pancreatic ductal dilatation. Spleen: The spleen is  enlarged in size.  No splenic lesion noted. Adrenals/Urinary Tract: No adrenal nodule bilaterally. Bilateral kidneys enhance symmetrically. Subcentimeter hypodensities are too small to characterize. No hydronephrosis. No hydroureter. The urinary bladder is unremarkable. Stomach/Bowel: Stomach is within normal limits. No evidence of bowel wall thickening or dilatation. Diffuse colonic diverticulosis. Status post appendectomy. Vascular/Lymphatic: [place] off a GIA  ill, perigastric, perisplenic venous collaterals. Recanalized paraumbilical vein. The hepatic, portal, splenic, superior mesenteric veins are patent. No abdominal aorta or iliac aneurysm. At least mild atherosclerotic plaque of the aorta and its branches. 5 mm calcification in the region of the left kidney likely vascular. No abdominal, pelvic, or inguinal lymphadenopathy. Reproductive: Not visualized due to streak artifact originating from bilateral femoral surgical hardware. Other: Trace to small simple free fluid within the abdomen and pelvis. No intraperitoneal free gas. No organized fluid collection. Musculoskeletal: No abdominal wall hernia or abnormality. No suspicious lytic or blastic osseous lesions. No acute displaced fracture. Multilevel degenerative changes of the spine with L2-L3 and L4-L5 intervertebral disc space vacuum phenomenon and endplate sclerosis. Bilateral total hip arthroplasty. Review of the MIP images confirms the above findings. IMPRESSION: 1. No pulmonary embolus. 2. Large right pleural effusion with partial collapse of the right lower lobe. 3. Cirrhotic morphology with portal hypertension. Markedly limited evaluation of the hepatic parenchyma for focal masses on this single phase study. Recommend MRI liver protocol for further evaluation. 4. Trace to small volume simple free fluid ascites. 5. A 1.1 cm hypodensity within the pancreatic head with no associated main pancreatic duct dilatation distally. Finding of unclear etiology. Can  be further evaluated on MRI liver protocol. 6. Diffuse colonic diverticulosis with no acute diverticulitis. 7. Cholelithiasis with no acute cholecystitis. Electronically Signed   By: Iven Finn M.D.   On: 01/12/2020 21:05   CT ABDOMEN PELVIS W CONTRAST  Result Date: 01/12/2020 CLINICAL DATA:  Abdominal distension, large right pleural effusion on chest x-ray. Suspected pulmonary embolus. EXAM: CT ANGIOGRAPHY CHEST CT ABDOMEN AND PELVIS WITH CONTRAST TECHNIQUE: Multidetector CT imaging of the chest was performed using the standard protocol during bolus administration of intravenous contrast. Multiplanar CT image reconstructions and MIPs were obtained to evaluate the vascular anatomy. Multidetector CT imaging of the abdomen and pelvis was performed using the standard protocol during bolus administration of intravenous contrast. CONTRAST:  66mL OMNIPAQUE IOHEXOL 350 MG/ML SOLN COMPARISON:  CT abdomen pelvis 04/28/2014 FINDINGS: CTA CHEST FINDINGS Cardiovascular: Satisfactory opacification of the pulmonary arteries to the segmental level. No evidence of pulmonary embolism. Normal heart size. No significant pericardial effusion. The thoracic aorta is normal in caliber. Mild-to-moderate atherosclerotic plaque of the thoracic aorta. Suggestion of mild mitral annular calcifications. Mild coronary artery calcifications. Mediastinum/Nodes: No enlarged mediastinal, hilar, or axillary lymph nodes. Thyroid gland, trachea, and esophagus demonstrate no significant findings. Lungs/Pleura: Partial collapse of the right lower lobe. Passive atelectasis of the right upper lobe. Left base atelectasis. Right large right pleural effusion. No left pleural effusion. No pneumothorax. Musculoskeletal: No chest wall abnormality No suspicious lytic or blastic osseous lesions. No acute displaced fracture. Multilevel degenerative changes of the spine with severe intervertebral disc space narrowing and endplate sclerosis at the T3-T8  levels. Review of the MIP images confirms the above findings. CT ABDOMEN and PELVIS FINDINGS Hepatobiliary: Nodular hepatic contour. No focal hepatic lesion on this single contrast study. Calcification within the gallbladder lumen likely represents a gallstone. Otherwise no gallbladder wall thickening or pericholecystic fluid. No biliary dilatation. Pancreas: There is a 1.1 cm hypodensity within the pancreatic head. Normal pancreatic contour. No surrounding inflammatory changes. No main pancreatic ductal dilatation. Spleen: The spleen is enlarged in size.  No splenic lesion noted. Adrenals/Urinary Tract: No adrenal nodule bilaterally. Bilateral kidneys enhance symmetrically. Subcentimeter hypodensities are too small to characterize. No hydronephrosis. No hydroureter. The urinary bladder is unremarkable. Stomach/Bowel: Stomach is within normal limits. No evidence of  bowel wall thickening or dilatation. Diffuse colonic diverticulosis. Status post appendectomy. Vascular/Lymphatic: [place] off a GIA ill, perigastric, perisplenic venous collaterals. Recanalized paraumbilical vein. The hepatic, portal, splenic, superior mesenteric veins are patent. No abdominal aorta or iliac aneurysm. At least mild atherosclerotic plaque of the aorta and its branches. 5 mm calcification in the region of the left kidney likely vascular. No abdominal, pelvic, or inguinal lymphadenopathy. Reproductive: Not visualized due to streak artifact originating from bilateral femoral surgical hardware. Other: Trace to small simple free fluid within the abdomen and pelvis. No intraperitoneal free gas. No organized fluid collection. Musculoskeletal: No abdominal wall hernia or abnormality. No suspicious lytic or blastic osseous lesions. No acute displaced fracture. Multilevel degenerative changes of the spine with L2-L3 and L4-L5 intervertebral disc space vacuum phenomenon and endplate sclerosis. Bilateral total hip arthroplasty. Review of the MIP images  confirms the above findings. IMPRESSION: 1. No pulmonary embolus. 2. Large right pleural effusion with partial collapse of the right lower lobe. 3. Cirrhotic morphology with portal hypertension. Markedly limited evaluation of the hepatic parenchyma for focal masses on this single phase study. Recommend MRI liver protocol for further evaluation. 4. Trace to small volume simple free fluid ascites. 5. A 1.1 cm hypodensity within the pancreatic head with no associated main pancreatic duct dilatation distally. Finding of unclear etiology. Can be further evaluated on MRI liver protocol. 6. Diffuse colonic diverticulosis with no acute diverticulitis. 7. Cholelithiasis with no acute cholecystitis. Electronically Signed   By: Tish FredericksonMorgane  Naveau M.D.   On: 01/12/2020 21:05    EKG: Personally reviewed.  Rhythm is normal sinus rhythm with heart rate of 72 bpm.  Notable PVCs.  No dynamic ST segment changes appreciated.  Assessment/Plan Principal Problem:   Acute on chronic diastolic CHF (congestive heart failure) (HCC)   Patient presenting with several week history of progressively worsening dyspnea on exertion, pillow orthopnea paroxysmal nocturnal dyspnea  Exam revealing markedly elevated JVP with extensive bilateral lower extremity pitting edema  Chest x-ray revealing substantial right-sided pleural effusion which I believe may be related to patient's acute congestive heart failure  Prior to proceeding with thoracentesis, I believe that patient should be treated with a trial of intravenous diuretic therapy for 24 to 48 hours.  If patient's shortness of breath and right-sided pleural effusion do not respond then patient can undergo therapeutic and diagnostic thoracentesis.  Patient fully anticipates going for thoracentesis likely on 12/23 if she does not respond to intravenous diuretics and is agreeable  Strict input and output monitoring  Serial chemistries to monitor renal function and  electrolytes  Active Problems:   Pleural effusion on right   Substantial right-sided pleural effusion noted on chest imaging  Possibly secondary to patient's history of alcoholic cirrhosis but additionally this could possibly be secondary to congestive heart failure  Considering patient's overall signs of suspected cardiogenic volume overload with elevated JVP and marked peripheral edema attempt a trial of intravenous diuretics for 24 hours or so and monitoring for response  If patient does not dramatically improve will tentatively plan for proceeding with IR guided thoracentesis on 12/23  Anemia  Patient denies frank bleeding such as hemoptysis, hematuria, hematochezia or melena  Question drop in hemoglobin to 7.6 on initial CBC on 12/21 with a repeat hemoglobin rising to 8.1  Obtaining iron panel, folate, vitamin B12  Continuing to monitor hemoglobin hematocrit with serial CBCs   Considering history of GAVE if hemoglobin does precipitously drop will consider GI consultation.    Hypothyroidism  Continue home regimen of Synthroid    Type 2 diabetes mellitus with hyperglycemia, with long-term current use of insulin (HCC)   Accu-Cheks before every meal and nightly with sliding scale insulin  Hemoglobin A1c pending  Patient has been placed on a somewhat reduced regimen of NovoLog 70/30 considering tenuous oral intake    Essential hypertension   Continue home regimen of metoprolol    Alcoholic cirrhosis of liver with ascites ([place])   Patient reports continued use of alcohol 3-4 nights a week  I have strongly advised patient that she must abstain from all forms of alcohol  Cirrhosis may be contributing at least in part to patient's large right-sided pleural effusion  Minimal ascites on CT imaging  Patient suffers from chronic thrombocytopenia secondary to cirrhosis    Portal hypertensive gastropathy ([place])   Continue home regimen of PPI    GERD without  esophagitis  Continue home regimen of PPI    Code Status:  DNR Family Communication: deferred   Status is: Inpatient  Remains inpatient appropriate because:Ongoing diagnostic testing needed not appropriate for outpatient work up, IV treatments appropriate due to intensity of illness or inability to take PO and Inpatient level of care appropriate due to severity of illness   Dispo: The patient is from: Home              Anticipated d/c is to: Home              Anticipated d/c date is: 3 days              Patient currently is not medically stable to d/c.        Vernelle Emerald MD [facility] Pager [phone]  If 7PM-7AM, please contact night-coverage [email or web address] Use universal [place] password for that web site. If you do not have the password, please call the hospital operator.  01/13/2020, 8:49 AM

## 2020-01-14 ENCOUNTER — Inpatient Hospital Stay (HOSPITAL_COMMUNITY): Payer: HMO

## 2020-01-14 DIAGNOSIS — K219 Gastro-esophageal reflux disease without esophagitis: Secondary | ICD-10-CM | POA: Diagnosis not present

## 2020-01-14 DIAGNOSIS — K7031 Alcoholic cirrhosis of liver with ascites: Secondary | ICD-10-CM

## 2020-01-14 DIAGNOSIS — I10 Essential (primary) hypertension: Secondary | ICD-10-CM | POA: Diagnosis not present

## 2020-01-14 DIAGNOSIS — I5033 Acute on chronic diastolic (congestive) heart failure: Secondary | ICD-10-CM | POA: Diagnosis not present

## 2020-01-14 DIAGNOSIS — E039 Hypothyroidism, unspecified: Secondary | ICD-10-CM

## 2020-01-14 LAB — BODY FLUID CELL COUNT WITH DIFFERENTIAL
Eos, Fluid: 1 %
Lymphs, Fluid: 46 %
Monocyte-Macrophage-Serous Fluid: 34 % — ABNORMAL LOW (ref 50–90)
Neutrophil Count, Fluid: 19 % (ref 0–25)
Total Nucleated Cell Count, Fluid: 144 cu mm (ref 0–1000)

## 2020-01-14 LAB — TYPE AND SCREEN
ABO/RH(D): A NEG
Antibody Screen: NEGATIVE
Unit division: 0

## 2020-01-14 LAB — LACTATE DEHYDROGENASE, PLEURAL OR PERITONEAL FLUID: LD, Fluid: 47 U/L — ABNORMAL HIGH (ref 3–23)

## 2020-01-14 LAB — CBC
HCT: 25.4 % — ABNORMAL LOW (ref 36.0–46.0)
Hemoglobin: 7.8 g/dL — ABNORMAL LOW (ref 12.0–15.0)
MCH: 25.2 pg — ABNORMAL LOW (ref 26.0–34.0)
MCHC: 30.7 g/dL (ref 30.0–36.0)
MCV: 81.9 fL (ref 80.0–100.0)
Platelets: 46 10*3/uL — ABNORMAL LOW (ref 150–400)
RBC: 3.1 MIL/uL — ABNORMAL LOW (ref 3.87–5.11)
RDW: 19.9 % — ABNORMAL HIGH (ref 11.5–15.5)
WBC: 3.5 10*3/uL — ABNORMAL LOW (ref 4.0–10.5)
nRBC: 0 % (ref 0.0–0.2)

## 2020-01-14 LAB — PROTEIN, PLEURAL OR PERITONEAL FLUID: Total protein, fluid: <3 g/dL

## 2020-01-14 LAB — BASIC METABOLIC PANEL
Anion gap: 14 (ref 5–15)
BUN: 19 mg/dL (ref 8–23)
CO2: 24 mmol/L (ref 22–32)
Calcium: 8.8 mg/dL — ABNORMAL LOW (ref 8.9–10.3)
Chloride: 103 mmol/L (ref 98–111)
Creatinine, Ser: 1.11 mg/dL — ABNORMAL HIGH (ref 0.44–1.00)
GFR, Estimated: 49 mL/min — ABNORMAL LOW (ref >60)
Glucose, Bld: 153 mg/dL — ABNORMAL HIGH (ref 70–99)
Potassium: 3.2 mmol/L — ABNORMAL LOW (ref 3.5–5.1)
Sodium: 141 mmol/L (ref 135–145)

## 2020-01-14 LAB — HEPATIC FUNCTION PANEL
ALT: 19 U/L (ref 0–44)
AST: 36 U/L (ref 15–41)
Albumin: 3.3 g/dL — ABNORMAL LOW (ref 3.5–5.0)
Alkaline Phosphatase: 70 U/L (ref 38–126)
Bilirubin, Direct: 0.6 mg/dL — ABNORMAL HIGH (ref 0.0–0.2)
Indirect Bilirubin: 1.8 mg/dL — ABNORMAL HIGH (ref 0.3–0.9)
Total Bilirubin: 2.4 mg/dL — ABNORMAL HIGH (ref 0.3–1.2)
Total Protein: 5.5 g/dL — ABNORMAL LOW (ref 6.5–8.1)

## 2020-01-14 LAB — GLUCOSE, CAPILLARY
Glucose-Capillary: 150 mg/dL — ABNORMAL HIGH (ref 70–99)
Glucose-Capillary: 149 mg/dL — ABNORMAL HIGH (ref 70–99)
Glucose-Capillary: 184 mg/dL — ABNORMAL HIGH (ref 70–99)
Glucose-Capillary: 264 mg/dL — ABNORMAL HIGH (ref 70–99)

## 2020-01-14 LAB — BPAM RBC
Blood Product Expiration Date: 202201222359
ISSUE DATE / TIME: 202112222150
Unit Type and Rh: 600

## 2020-01-14 MED ORDER — GADOBUTROL 1 MMOL/ML IV SOLN
8.0000 mL | Freq: Once | INTRAVENOUS | Status: AC | PRN
  Administered 2020-01-14: 8 mL via INTRAVENOUS

## 2020-01-14 MED ORDER — LIDOCAINE HCL 1 % IJ SOLN
INTRAMUSCULAR | Status: AC
  Filled 2020-01-14: qty 20

## 2020-01-14 MED ORDER — POTASSIUM CHLORIDE CRYS ER 20 MEQ PO TBCR
40.0000 meq | EXTENDED_RELEASE_TABLET | Freq: Two times a day (BID) | ORAL | Status: AC
  Administered 2020-01-14: 40 meq via ORAL
  Filled 2020-01-14: qty 2

## 2020-01-14 NOTE — Progress Notes (Addendum)
PROGRESS NOTE    Jessica Dennis  [id number] DOB: 1932-02-14 DOA: 01/12/2020 PCP: Ann Held, DO    Chief Complaint  Patient presents with  . Abnormal Lab    Brief Narrative: 86 year old female with past medical history of gastroesophageal reflux disease, hypertension, hyperlipidemia, hypothyroidism, alcoholic cirrhosis, insulin-dependent diabetes mellitus type2,diabetic neuropathy, GAVE, diastolic congestiveheart failure(Echo 11/2019 EF 60-65%)who presented to [place] emergency department on 12/21 with shortness of breath. Patient was found to have a substantial right-sided pleural effusion thought to be contributing to the patient's shortness of breath. Patient was administered albumin due to concerns for transient hypotension. Due to perceived need for thoracentesis the hospitalist group was called and the patient was accepted to [facility] for further care. She underwent thoracentesis on 12/23 and 2 lit of fluid removed and fluid sent for analysis.   Assessment & Plan:   Principal Problem:   Acute on chronic diastolic CHF (congestive heart failure) (HCC) Active Problems:   Hypothyroidism   Type 2 diabetes mellitus with hyperglycemia, with long-term current use of insulin (HCC)   GERD without esophagitis   Essential hypertension   Alcoholic cirrhosis of liver with ascites (HCC)   Portal hypertensive gastropathy (HCC)   Shortness of breath   Pleural effusion on right   Right large pleural effusion differential include transudate from liver cirrhosis. Vs from para pneumonic effusion from CAP vs  From acute on chronic diastolic CHF.  S/p thoracentesis and 2l it of fluid taken out.  Fluid sent for analysis.  No indication of antibiotics at this time.    Mild acute on chronic diastolic heart failure:  Currently on IV lasix 20 mg BID.  Diuresed about 3.7 lit of fluid in the last 24 hours. .  Continue the same dose of lasix.  creatinine improving with IV lasix.    Hypothyroidism Continue with Synthroid.   Type 2 diabetes mellitus. CBG (last 3)  Recent Labs    01/13/20 2112 01/14/20 0723 01/14/20 1120  GLUCAP 98 150* 149*   Resume SSI. No changes in meds.    Liver cirrhosis, no ascites found Continue with Lasix and spironolactone.  Get ammonia level in the morning.   GERD Continue  PPI.   Anemia of chronic disease Probably secondary to liver cirrhosis. Hemoglobin on admission at 6.3 s/p 1 unit of PRBC transfusion and hemoglobin has improved to 7.8. We will check for fecal occult blood as she is prone for varices in view of her history of liver cirrhosis.   Pancytopenia probably secondary to liver cirrhosis.   Hypokalemia Replaced   1.1 cm hypodensity within the pancreatic head with no associated main pancreatic duct dilatation  MRI of the liver ordered for further evaluation    DVT prophylaxis: SCDs Code Status: DNR Family Communication: discussed with family over the phone.  Disposition:   Status is: Inpatient  Remains inpatient appropriate because:Ongoing diagnostic testing needed not appropriate for outpatient work up, Unsafe d/c plan and IV treatments appropriate due to intensity of illness or inability to take PO   Dispo: The patient is from: Home              Anticipated d/c is to: pending.               Anticipated d/c date is: 2 days              Patient currently is not medically stable to d/c.       Consultants:  IR     Procedures: Thoracentesis   MRI liver.    Antimicrobials: none.    Subjective: Reports did not have a good night sleep yesterday.   Objective: Vitals:   01/14/20 1020 01/14/20 1042 01/14/20 1104 01/14/20 1425  BP: (!) 110/54 (!) 115/57 (!) 111/56 (!) 117/55  Pulse:    89  Resp:    16  Temp:    98.7 F (37.1 C)  TempSrc:    Oral  SpO2:    91%  Weight:      Height:        Intake/Output Summary (Last 24 hours) at  01/14/2020 1604 Last data filed at 01/14/2020 1439 Gross per 24 hour  Intake 950 ml  Output 4550 ml  Net -3600 ml   Filed Weights   01/12/20 1828  Weight: 81.6 kg    Examination:  General exam: Appears calm and comfortable ON 2 LIT OF Alexis OXYGEN.  Respiratory system: diminished air entry on the right, no tachypnea.  Cardiovascular system: S1 & S2 heard, RRR. No JVD,  No pedal edema. Gastrointestinal system: Abdomen is nondistended, soft and nontender.  Normal bowel sounds heard. Central nervous system: Alert and oriented. No focal neurological deficits. Extremities: Symmetric 5 x 5 power. Skin: No rashes, lesions or ulcers Psychiatry:  Mood & affect appropriate.     Data Reviewed: I have personally reviewed following labs and imaging studies  CBC: Recent Labs  Lab 01/12/20 1241 01/12/20 1815 01/13/20 0610 01/13/20 0847 01/14/20 0618  WBC 4.7 5.1 2.5*  --  3.5*  NEUTROABS 3.6 3.5 1.6*  --   --   HGB 7.6 Repeated and verified X2.* 8.1* 6.3* 6.3* 7.8*  HCT 24.1* 27.5* 21.5* 22.0* 25.4*  MCV 75.9* 80.4 81.7  --  81.9  PLT 60.0* 62* 42*  --  46*    Basic Metabolic Panel: Recent Labs  Lab 01/12/20 1241 01/12/20 1815 01/13/20 0610 01/14/20 0618  NA 138 140 141 141  K 3.9 3.3* 3.4* 3.2*  CL 102 104 105 103  CO2 28 25 23 24   GLUCOSE 325* 105* 181* 153*  BUN 23 23 22 19   CREATININE 1.39* 1.30* 1.11* 1.11*  CALCIUM 8.7 9.2 9.2 8.8*  MG  --   --  2.2  --     GFR: Estimated Creatinine Clearance: 40.7 mL/min (A) (by C-G formula based on SCr of 1.11 mg/dL (H)).  Liver Function Tests: Recent Labs  Lab 01/12/20 1241 01/13/20 0610 01/14/20 0614  AST 34 33 36  ALT 17 17 19   ALKPHOS 97 72 70  BILITOT 1.3* 1.7* 2.4*  PROT 5.5* 5.6* 5.5*  ALBUMIN 3.2* 3.7 3.3*    CBG: Recent Labs  Lab 01/13/20 1121 01/13/20 1646 01/13/20 2112 01/14/20 0723 01/14/20 1120  GLUCAP 190* 174* 98 150* 149*     Recent Results (from the past 240 hour(s))  Resp Panel by  RT-PCR (Flu A&B, Covid) Nasopharyngeal Swab     Status: None   Collection Time: 01/12/20  6:30 PM   Specimen: Nasopharyngeal Swab; Nasopharyngeal(NP) swabs in vial transport medium  Result Value Ref Range Status   SARS Coronavirus 2 by RT PCR NEGATIVE NEGATIVE Final    Comment: (NOTE) SARS-CoV-2 target nucleic acids are NOT DETECTED.  The SARS-CoV-2 RNA is generally detectable in upper respiratory specimens during the acute phase of infection. The lowest concentration of SARS-CoV-2 viral copies this assay can detect is 138 copies/mL. A negative result does not preclude SARS-Cov-2 infection and should not be  used as the sole basis for treatment or other patient management decisions. A negative result may occur with  improper specimen collection/handling, submission of specimen other than nasopharyngeal swab, presence of viral mutation(s) within the areas targeted by this assay, and inadequate number of viral copies(<138 copies/mL). A negative result must be combined with clinical observations, patient history, and epidemiological information. The expected result is Negative.  Fact Sheet for Patients:  BloggerCourse.comhttps://www.fda.gov/media/152166/download  Fact Sheet for Healthcare Providers:  SeriousBroker.ithttps://www.fda.gov/media/152162/download  This test is no t yet approved or cleared by the [place] FDA and  has been authorized for detection and/or diagnosis of SARS-CoV-2 by FDA under an Emergency Use Authorization (EUA). This EUA will remain  in effect (meaning this test can be used) for the duration of the COVID-19 declaration under Section 564(b)(1) of the Act, 21 U.S.C.section 360bbb-3(b)(1), unless the authorization is terminated  or revoked sooner.       Influenza A by PCR NEGATIVE NEGATIVE Final   Influenza B by PCR NEGATIVE NEGATIVE Final    Comment: (NOTE) The Xpert Xpress SARS-CoV-2/FLU/RSV plus assay is intended as an aid in the diagnosis of influenza from Nasopharyngeal swab  specimens and should not be used as a sole basis for treatment. Nasal washings and aspirates are unacceptable for Xpert Xpress SARS-CoV-2/FLU/RSV testing.  Fact Sheet for Patients: BloggerCourse.comhttps://www.fda.gov/media/152166/download  Fact Sheet for Healthcare Providers: SeriousBroker.ithttps://www.fda.gov/media/152162/download  This test is not yet approved or cleared by the [place] FDA and has been authorized for detection and/or diagnosis of SARS-CoV-2 by FDA under an Emergency Use Authorization (EUA). This EUA will remain in effect (meaning this test can be used) for the duration of the COVID-19 declaration under Section 564(b)(1) of the Act, 21 U.S.C. section 360bbb-3(b)(1), unless the authorization is terminated or revoked.  Performed at [facility] [place], [address] [place], [place] [id number]          Radiology Studies: DG Chest 1 View  Result Date: 01/14/2020 CLINICAL DATA:  Status post thoracentesis EXAM: CHEST  1 VIEW COMPARISON:  January 12, 2020 FINDINGS: No pneumothorax. Right pleural effusion significantly smaller following thoracentesis. There is residual pleural effusion on the right with small pleural effusion on the left. There is bibasilar atelectasis. Heart is upper normal in size with pulmonary vascularity normal. No adenopathy. No bone lesions. IMPRESSION: No pneumothorax. Right pleural effusion significantly smaller following thoracentesis. Fairly small pleural effusions present bilaterally with bibasilar atelectasis. Stable cardiac silhouette. Electronically Signed   By: Bretta BangWilliam  Woodruff III M.D.   On: 01/14/2020 11:10   CT Angio Chest PE W and/or Wo Contrast  Result Date: 01/12/2020 CLINICAL DATA:  Abdominal distension, large right pleural effusion on chest x-ray. Suspected pulmonary embolus. EXAM: CT ANGIOGRAPHY CHEST CT ABDOMEN AND PELVIS WITH CONTRAST TECHNIQUE: Multidetector CT imaging of the chest was performed using the standard protocol during bolus  administration of intravenous contrast. Multiplanar CT image reconstructions and MIPs were obtained to evaluate the vascular anatomy. Multidetector CT imaging of the abdomen and pelvis was performed using the standard protocol during bolus administration of intravenous contrast. CONTRAST:  80mL OMNIPAQUE IOHEXOL 350 MG/ML SOLN COMPARISON:  CT abdomen pelvis 04/28/2014 FINDINGS: CTA CHEST FINDINGS Cardiovascular: Satisfactory opacification of the pulmonary arteries to the segmental level. No evidence of pulmonary embolism. Normal heart size. No significant pericardial effusion. The thoracic aorta is normal in caliber. Mild-to-moderate atherosclerotic plaque of the thoracic aorta. Suggestion of mild mitral annular calcifications. Mild coronary artery calcifications. Mediastinum/Nodes: No enlarged mediastinal, hilar, or axillary lymph  nodes. Thyroid gland, trachea, and esophagus demonstrate no significant findings. Lungs/Pleura: Partial collapse of the right lower lobe. Passive atelectasis of the right upper lobe. Left base atelectasis. Right large right pleural effusion. No left pleural effusion. No pneumothorax. Musculoskeletal: No chest wall abnormality No suspicious lytic or blastic osseous lesions. No acute displaced fracture. Multilevel degenerative changes of the spine with severe intervertebral disc space narrowing and endplate sclerosis at the T3-T8 levels. Review of the MIP images confirms the above findings. CT ABDOMEN and PELVIS FINDINGS Hepatobiliary: Nodular hepatic contour. No focal hepatic lesion on this single contrast study. Calcification within the gallbladder lumen likely represents a gallstone. Otherwise no gallbladder wall thickening or pericholecystic fluid. No biliary dilatation. Pancreas: There is a 1.1 cm hypodensity within the pancreatic head. Normal pancreatic contour. No surrounding inflammatory changes. No main pancreatic ductal dilatation. Spleen: The spleen is enlarged in size.  No  splenic lesion noted. Adrenals/Urinary Tract: No adrenal nodule bilaterally. Bilateral kidneys enhance symmetrically. Subcentimeter hypodensities are too small to characterize. No hydronephrosis. No hydroureter. The urinary bladder is unremarkable. Stomach/Bowel: Stomach is within normal limits. No evidence of bowel wall thickening or dilatation. Diffuse colonic diverticulosis. Status post appendectomy. Vascular/Lymphatic: [place] off a GIA ill, perigastric, perisplenic venous collaterals. Recanalized paraumbilical vein. The hepatic, portal, splenic, superior mesenteric veins are patent. No abdominal aorta or iliac aneurysm. At least mild atherosclerotic plaque of the aorta and its branches. 5 mm calcification in the region of the left kidney likely vascular. No abdominal, pelvic, or inguinal lymphadenopathy. Reproductive: Not visualized due to streak artifact originating from bilateral femoral surgical hardware. Other: Trace to small simple free fluid within the abdomen and pelvis. No intraperitoneal free gas. No organized fluid collection. Musculoskeletal: No abdominal wall hernia or abnormality. No suspicious lytic or blastic osseous lesions. No acute displaced fracture. Multilevel degenerative changes of the spine with L2-L3 and L4-L5 intervertebral disc space vacuum phenomenon and endplate sclerosis. Bilateral total hip arthroplasty. Review of the MIP images confirms the above findings. IMPRESSION: 1. No pulmonary embolus. 2. Large right pleural effusion with partial collapse of the right lower lobe. 3. Cirrhotic morphology with portal hypertension. Markedly limited evaluation of the hepatic parenchyma for focal masses on this single phase study. Recommend MRI liver protocol for further evaluation. 4. Trace to small volume simple free fluid ascites. 5. A 1.1 cm hypodensity within the pancreatic head with no associated main pancreatic duct dilatation distally. Finding of unclear etiology. Can be further evaluated  on MRI liver protocol. 6. Diffuse colonic diverticulosis with no acute diverticulitis. 7. Cholelithiasis with no acute cholecystitis. Electronically Signed   By: Iven Finn M.D.   On: 01/12/2020 21:05   CT ABDOMEN PELVIS W CONTRAST  Result Date: 01/12/2020 CLINICAL DATA:  Abdominal distension, large right pleural effusion on chest x-ray. Suspected pulmonary embolus. EXAM: CT ANGIOGRAPHY CHEST CT ABDOMEN AND PELVIS WITH CONTRAST TECHNIQUE: Multidetector CT imaging of the chest was performed using the standard protocol during bolus administration of intravenous contrast. Multiplanar CT image reconstructions and MIPs were obtained to evaluate the vascular anatomy. Multidetector CT imaging of the abdomen and pelvis was performed using the standard protocol during bolus administration of intravenous contrast. CONTRAST:  50mL OMNIPAQUE IOHEXOL 350 MG/ML SOLN COMPARISON:  CT abdomen pelvis 04/28/2014 FINDINGS: CTA CHEST FINDINGS Cardiovascular: Satisfactory opacification of the pulmonary arteries to the segmental level. No evidence of pulmonary embolism. Normal heart size. No significant pericardial effusion. The thoracic aorta is normal in caliber. Mild-to-moderate atherosclerotic plaque of the thoracic aorta. Suggestion of  mild mitral annular calcifications. Mild coronary artery calcifications. Mediastinum/Nodes: No enlarged mediastinal, hilar, or axillary lymph nodes. Thyroid gland, trachea, and esophagus demonstrate no significant findings. Lungs/Pleura: Partial collapse of the right lower lobe. Passive atelectasis of the right upper lobe. Left base atelectasis. Right large right pleural effusion. No left pleural effusion. No pneumothorax. Musculoskeletal: No chest wall abnormality No suspicious lytic or blastic osseous lesions. No acute displaced fracture. Multilevel degenerative changes of the spine with severe intervertebral disc space narrowing and endplate sclerosis at the T3-T8 levels. Review of the MIP  images confirms the above findings. CT ABDOMEN and PELVIS FINDINGS Hepatobiliary: Nodular hepatic contour. No focal hepatic lesion on this single contrast study. Calcification within the gallbladder lumen likely represents a gallstone. Otherwise no gallbladder wall thickening or pericholecystic fluid. No biliary dilatation. Pancreas: There is a 1.1 cm hypodensity within the pancreatic head. Normal pancreatic contour. No surrounding inflammatory changes. No main pancreatic ductal dilatation. Spleen: The spleen is enlarged in size.  No splenic lesion noted. Adrenals/Urinary Tract: No adrenal nodule bilaterally. Bilateral kidneys enhance symmetrically. Subcentimeter hypodensities are too small to characterize. No hydronephrosis. No hydroureter. The urinary bladder is unremarkable. Stomach/Bowel: Stomach is within normal limits. No evidence of bowel wall thickening or dilatation. Diffuse colonic diverticulosis. Status post appendectomy. Vascular/Lymphatic: [place] off a GIA ill, perigastric, perisplenic venous collaterals. Recanalized paraumbilical vein. The hepatic, portal, splenic, superior mesenteric veins are patent. No abdominal aorta or iliac aneurysm. At least mild atherosclerotic plaque of the aorta and its branches. 5 mm calcification in the region of the left kidney likely vascular. No abdominal, pelvic, or inguinal lymphadenopathy. Reproductive: Not visualized due to streak artifact originating from bilateral femoral surgical hardware. Other: Trace to small simple free fluid within the abdomen and pelvis. No intraperitoneal free gas. No organized fluid collection. Musculoskeletal: No abdominal wall hernia or abnormality. No suspicious lytic or blastic osseous lesions. No acute displaced fracture. Multilevel degenerative changes of the spine with L2-L3 and L4-L5 intervertebral disc space vacuum phenomenon and endplate sclerosis. Bilateral total hip arthroplasty. Review of the MIP images confirms the above  findings. IMPRESSION: 1. No pulmonary embolus. 2. Large right pleural effusion with partial collapse of the right lower lobe. 3. Cirrhotic morphology with portal hypertension. Markedly limited evaluation of the hepatic parenchyma for focal masses on this single phase study. Recommend MRI liver protocol for further evaluation. 4. Trace to small volume simple free fluid ascites. 5. A 1.1 cm hypodensity within the pancreatic head with no associated main pancreatic duct dilatation distally. Finding of unclear etiology. Can be further evaluated on MRI liver protocol. 6. Diffuse colonic diverticulosis with no acute diverticulitis. 7. Cholelithiasis with no acute cholecystitis. Electronically Signed   By: Iven Finn M.D.   On: 01/12/2020 21:05   US THORACENTESIS ASP PLEURAL SPACE W/IMG GUIDE  Result Date: 01/14/2020 INDICATION: Patient with history of GERD, hypertension, alcoholic cirrhosis, diabetes, congestive heart failure, dyspnea, right pleural effusion. Request received for diagnostic and therapeutic right thoracentesis. EXAM: ULTRASOUND GUIDED DIAGNOSTIC AND THERAPEUTIC RIGHT THORACENTESIS MEDICATIONS: 1% lidocaine to skin and subcutaneous tissue COMPLICATIONS: None immediate. PROCEDURE: An ultrasound guided thoracentesis was thoroughly discussed with the patient and questions answered. The benefits, risks, alternatives and complications were also discussed. The patient understands and wishes to proceed with the procedure. Written consent was obtained. Ultrasound was performed to localize and mark an adequate pocket of fluid in the right chest. The area was then prepped and draped in the normal sterile fashion. 1% Lidocaine was used for local anesthesia.  Under ultrasound guidance a 6 Fr Safe-T-Centesis catheter was introduced. Thoracentesis was performed. The catheter was removed and a dressing applied. FINDINGS: A total of approximately 2 liters of slightly hazy, yellow fluid was removed. Samples were  sent to the laboratory as requested by the clinical team. IMPRESSION: Successful ultrasound guided diagnostic and therapeutic right thoracentesis yielding 2 liters of pleural fluid. Read by: Rowe Robert, PA-C Electronically Signed   By: Jerilynn Mages.  Shick M.D.   On: 01/14/2020 11:04        Scheduled Meds: . furosemide  20 mg Intravenous BID  . insulin aspart  0-15 Units Subcutaneous TID AC & HS  . latanoprost  1 drop Both Eyes QHS  . levothyroxine  75 mcg Oral Q0600  . lidocaine      . metoprolol succinate  12.5 mg Oral Daily  . pantoprazole  40 mg Oral Daily  . potassium chloride  40 mEq Oral BID  . spironolactone  12.5 mg Oral Daily   Continuous Infusions:   LOS: 2 days        Hosie Poisson, MD [facility]   To contact the attending provider between 7A-7P or the covering provider during after hours 7P-7A, please log into the web site [email or web address] and access using universal [place] password for that web site. If you do not have the password, please call the hospital operator.  01/14/2020, 4:04 PM

## 2020-01-14 NOTE — Procedures (Signed)
Ultrasound-guided diagnostic and therapeutic right thoracentesis performed yielding 2 liters of slightly hazy, yellow fluid. No immediate complications. Follow-up chest x-ray pending. A portion of the fluid was sent to the lab for preordered studies. EBL< 2 cc.

## 2020-01-14 NOTE — Plan of Care (Signed)
  Problem: Education: Goal: Knowledge of General Education information will improve Description: Including pain rating scale, medication(s)/side effects and non-pharmacologic comfort measures Outcome: Progressing   Problem: Clinical Measurements: Goal: Will remain free from infection Outcome: Progressing Goal: Respiratory complications will improve Outcome: Progressing   

## 2020-01-14 NOTE — Evaluation (Signed)
Physical Therapy Evaluation Patient Details Name: Jessica Dennis MRN: [id number] DOB: 01-10-1933 Today's Date: 01/14/2020   History of Present Illness  86 year old female with past medical history of GERD, HTN, HLD, hypothyroidism, alcoholic cirrhosis, DMII, diabetic neuropathy, GAVE, CHF (Echo 11/2019 EF 60-65%), OA, anxiety who presented to [place] emergency department on 12/21 with shortness of breath. Patient was found to have a substantial right-sided pleural effusion thought to be contributing to the patient's shortness of breath and admitted to [facility] for thoracocentesis.    Clinical Impression  Jessica Dennis is 86 y.o. female admitted with above HPI and diagnosis. Patient is currently limited by functional impairments below (see PT problem list). Patient lives alone and reports independence with RW for mobility at baseline. She currently requires min guard for safety with transfers and gait using RW. Patient will benefit from continued skilled PT interventions to address impairments and progress independence with mobility, recommending HHPT and intermittent assist from family/friends if available. Uncertain how much family/friends can assist as pt was not very forthcoming on who can help her once she is home. Acute PT will follow and progress as able.     Follow Up Recommendations Home health PT    Equipment Recommendations  None recommended by PT    Recommendations for Other Services       Precautions / Restrictions Precautions Precautions: Fall Restrictions Weight Bearing Restrictions: No      Mobility  Bed Mobility Overal bed mobility: Needs Assistance Bed Mobility: Supine to Sit;Rolling Rolling: Supervision   Supine to sit: Min guard;HOB elevated     General bed mobility comments: no assist needed, pt taking extra time and using bed rail to roll and sit up EOB.    Transfers Overall transfer level: Needs assistance Equipment used: Rolling walker (2  wheeled) Transfers: Sit to/from Stand Sit to Stand: Min guard         General transfer comment: pt took 3 attempts to rise from EOB, close min guard/light assist for power up. pt steady in standing. repeated sit<>stand for LE strength testing.  Ambulation/Gait Ambulation/Gait assistance: Min guard;Min assist Gait Distance (Feet): 200 Feet Assistive device: Rolling walker (2 wheeled) Gait Pattern/deviations: Step-through pattern;Decreased stride length;Drifts right/left;Wide base of support Gait velocity: fair   General Gait Details: pt with slightly wide stance and slight foot slap bil during gait. no overt LOB noted however pt drifts Lt/Rt in hallway. pt waits until she is close to objects to adjust direction/navigation of RW. HR in 100-110's throughout with no c/o SOB.  Stairs            Wheelchair Mobility    Modified Rankin (Stroke Patients Only)       Balance Overall balance assessment: Needs assistance Sitting-balance support: Feet supported;No upper extremity supported Sitting balance-Leahy Scale: Good     Standing balance support: During functional activity;Bilateral upper extremity supported Standing balance-Leahy Scale: Fair                               Pertinent Vitals/Pain Pain Assessment: No/denies pain    Home Living Family/patient expects to be discharged to:: Private residence Living Arrangements: Alone Available Help at Discharge: Family (pt reports son lives in [place]) Type of Home: House Home Access: Stairs to enter Entrance Stairs-Rails: None Technical brewer of Steps: 1+1 Home Layout: One level Home Equipment: Cane - single point;Bedside commode;Grab bars - tub/shower;Shower seat - built in;Walker - 2 wheels  Prior Function Level of Independence: Independent with assistive device(s)         Comments: pt uses RW at baseline for household and community mobility     Hand Dominance   Dominant Hand:  Right    Extremity/Trunk Assessment   Upper Extremity Assessment Upper Extremity Assessment: Overall WFL for tasks assessed    Lower Extremity Assessment Lower Extremity Assessment: Generalized weakness    Cervical / Trunk Assessment Cervical / Trunk Assessment: Normal  Communication   Communication: No difficulties  Cognition Arousal/Alertness: Awake/alert Behavior During Therapy: WFL for tasks assessed/performed Overall Cognitive Status: Within Functional Limits for tasks assessed                                 General Comments: pt very pleasant and oriented x4. with encouragement she is agreeable to mobilize.      General Comments General comments (skin integrity, edema, etc.): 5 x Sit<>Stand Test: 22.1 seconds, pt unable to rise without UE use and completed with bil UE use for power up.    Exercises     Assessment/Plan    PT Assessment Patient needs continued PT services  PT Problem List Decreased strength;Decreased activity tolerance;Decreased balance;Decreased mobility;Decreased knowledge of use of DME;Decreased safety awareness       PT Treatment Interventions DME instruction;Gait training;Stair training;Functional mobility training;Therapeutic activities;Therapeutic exercise;Balance training;Patient/family education    PT Goals (Current goals can be found in the Care Plan section)  Acute Rehab PT Goals Patient Stated Goal: return home to her dog PT Goal Formulation: With patient Time For Goal Achievement: 01/28/20 Potential to Achieve Goals: Good    Frequency Min 3X/week   Barriers to discharge Decreased caregiver support pt lives alone, uncertain how much family/friend support is available    Co-evaluation               AM-PAC PT "6 Clicks" Mobility  Outcome Measure Help needed turning from your back to your side while in a flat bed without using bedrails?: None Help needed moving from lying on your back to sitting on the side of  a flat bed without using bedrails?: A Little Help needed moving to and from a bed to a chair (including a wheelchair)?: A Little Help needed standing up from a chair using your arms (e.g., wheelchair or bedside chair)?: A Little Help needed to walk in hospital room?: A Little Help needed climbing 3-5 steps with a railing? : A Little 6 Click Score: 19    End of Session Equipment Utilized During Treatment: Gait belt Activity Tolerance: Patient tolerated treatment well Patient left: in bed;with call bell/phone within reach;with bed alarm set Nurse Communication: Mobility status PT Visit Diagnosis: Muscle weakness (generalized) (M62.81);Unsteadiness on feet (R26.81);Difficulty in walking, not elsewhere classified (R26.2)    Time: 5427-0623 PT Time Calculation (min) (ACUTE ONLY): 25 min   Charges:   PT Evaluation $PT Eval Low Complexity: 1 Low PT Treatments $Gait Training: 8-22 mins        Verner Mould, DPT Acute Rehabilitation Services Office [phone] Pager [phone]    Jacques Navy 01/14/2020, 4:17 PM

## 2020-01-15 DIAGNOSIS — K219 Gastro-esophageal reflux disease without esophagitis: Secondary | ICD-10-CM | POA: Diagnosis not present

## 2020-01-15 DIAGNOSIS — I10 Essential (primary) hypertension: Secondary | ICD-10-CM | POA: Diagnosis not present

## 2020-01-15 DIAGNOSIS — K7031 Alcoholic cirrhosis of liver with ascites: Secondary | ICD-10-CM | POA: Diagnosis not present

## 2020-01-15 DIAGNOSIS — I5033 Acute on chronic diastolic (congestive) heart failure: Secondary | ICD-10-CM

## 2020-01-15 LAB — BASIC METABOLIC PANEL
Anion gap: 11 (ref 5–15)
BUN: 19 mg/dL (ref 8–23)
CO2: 26 mmol/L (ref 22–32)
Calcium: 8.5 mg/dL — ABNORMAL LOW (ref 8.9–10.3)
Chloride: 104 mmol/L (ref 98–111)
Creatinine, Ser: 1.14 mg/dL — ABNORMAL HIGH (ref 0.44–1.00)
GFR, Estimated: 47 mL/min — ABNORMAL LOW (ref >60)
Glucose, Bld: 179 mg/dL — ABNORMAL HIGH (ref 70–99)
Potassium: 3.5 mmol/L (ref 3.5–5.1)
Sodium: 141 mmol/L (ref 135–145)

## 2020-01-15 LAB — CBC
HCT: 26 % — ABNORMAL LOW (ref 36.0–46.0)
Hemoglobin: 7.8 g/dL — ABNORMAL LOW (ref 12.0–15.0)
MCH: 24.8 pg — ABNORMAL LOW (ref 26.0–34.0)
MCHC: 30 g/dL (ref 30.0–36.0)
MCV: 82.5 fL (ref 80.0–100.0)
Platelets: 41 10*3/uL — ABNORMAL LOW (ref 150–400)
RBC: 3.15 MIL/uL — ABNORMAL LOW (ref 3.87–5.11)
RDW: 20 % — ABNORMAL HIGH (ref 11.5–15.5)
WBC: 3 10*3/uL — ABNORMAL LOW (ref 4.0–10.5)
nRBC: 0 % (ref 0.0–0.2)

## 2020-01-15 LAB — GLUCOSE, CAPILLARY
Glucose-Capillary: 174 mg/dL — ABNORMAL HIGH (ref 70–99)
Glucose-Capillary: 270 mg/dL — ABNORMAL HIGH (ref 70–99)
Glucose-Capillary: 280 mg/dL — ABNORMAL HIGH (ref 70–99)
Glucose-Capillary: 276 mg/dL — ABNORMAL HIGH (ref 70–99)

## 2020-01-15 MED ORDER — SODIUM CHLORIDE 0.9 % IV SOLN
510.0000 mg | Freq: Once | INTRAVENOUS | Status: AC
  Administered 2020-01-15: 510 mg via INTRAVENOUS
  Filled 2020-01-15: qty 510

## 2020-01-15 MED ORDER — FOLIC ACID 1 MG PO TABS
1.0000 mg | ORAL_TABLET | Freq: Every day | ORAL | Status: DC
  Administered 2020-01-15 – 2020-01-17 (×3): 1 mg via ORAL
  Filled 2020-01-15 (×3): qty 1

## 2020-01-15 NOTE — TOC Progression Note (Signed)
Transition of Care [facility]) - Progression Note    Patient Details  Name: Jessica Dennis MRN: [id number] Date of Birth: 1932/08/07  Transition of Care [facility] CM/SW Contact  Hill Mackie, Juliann Pulse, RN Phone Number: 01/15/2020, 1:05 PM  Clinical Narrative: [facility] following for HHPT.      Expected Discharge Plan: [place] Barriers to Discharge: Continued Medical Work up  Expected Discharge Plan and Services Expected Discharge Plan: [place]   Discharge Planning Services: CM Consult   Living arrangements for the past 2 months: Single Family Home                                       Social Determinants of Health (SDOH) Interventions    Readmission Risk Interventions No flowsheet data found.

## 2020-01-15 NOTE — Plan of Care (Signed)

## 2020-01-15 NOTE — Plan of Care (Signed)
  Problem: Education: Goal: Knowledge of General Education information will improve Description: Including pain rating scale, medication(s)/side effects and non-pharmacologic comfort measures Outcome: Progressing   Problem: Clinical Measurements: Goal: Respiratory complications will improve Outcome: Progressing   Problem: Activity: Goal: Risk for activity intolerance will decrease Outcome: Progressing   Problem: Nutrition: Goal: Adequate nutrition will be maintained Outcome: Progressing   Problem: Coping: Goal: Level of anxiety will decrease Outcome: Progressing   Problem: Elimination: Goal: Will not experience complications related to bowel motility Outcome: Progressing Goal: Will not experience complications related to urinary retention Outcome: Progressing   Problem: Pain Managment: Goal: General experience of comfort will improve Outcome: Progressing   Problem: Safety: Goal: Ability to remain free from injury will improve Outcome: Progressing   Problem: Skin Integrity: Goal: Risk for impaired skin integrity will decrease Outcome: Progressing   Problem: Education: Goal: Knowledge of General Education information will improve Description: Including pain rating scale, medication(s)/side effects and non-pharmacologic comfort measures Outcome: Progressing   Problem: Clinical Measurements: Goal: Respiratory complications will improve Outcome: Progressing   Problem: Activity: Goal: Risk for activity intolerance will decrease Outcome: Progressing   Problem: Nutrition: Goal: Adequate nutrition will be maintained Outcome: Progressing   Problem: Coping: Goal: Level of anxiety will decrease Outcome: Progressing   Problem: Elimination: Goal: Will not experience complications related to bowel motility Outcome: Progressing Goal: Will not experience complications related to urinary retention Outcome: Progressing   Problem: Pain Managment: Goal: General  experience of comfort will improve Outcome: Progressing   Problem: Safety: Goal: Ability to remain free from injury will improve Outcome: Progressing   Problem: Skin Integrity: Goal: Risk for impaired skin integrity will decrease Outcome: Progressing

## 2020-01-15 NOTE — Evaluation (Signed)
Occupational Therapy Evaluation Patient Details Name: Jessica Dennis MRN: [id number] DOB: Nov 17, 1932 Today's Date: 01/15/2020    History of Present Illness 86 year old female with past medical history of GERD, HTN, HLD, hypothyroidism, alcoholic cirrhosis, DMII, diabetic neuropathy, GAVE, CHF (Echo 11/2019 EF 60-65%), OA, anxiety who presented to [place] emergency department on 12/21 with shortness of breath. Patient was found to have a substantial right-sided pleural effusion thought to be contributing to the patient's shortness of breath and admitted to [facility] for thoracocentesis.   Clinical Impression   Patient reports her ex-husband lives upstairs and helps her as needed, typically is mod I with self care and functional ambulation with rolling walker. Patient appears near/at baseline, no physical assistance needed for transfers, ambulation with min A for LB dressing however pt states she does not wear socks at home. No further acute OT needs at this time, will discontinue. Please re-consult if new needs arise.    Follow Up Recommendations  No OT follow up    Equipment Recommendations  None recommended by OT       Precautions / Restrictions Precautions Precautions: Fall Restrictions Weight Bearing Restrictions: No      Mobility Bed Mobility Overal bed mobility: Modified Independent                  Transfers Overall transfer level: Modified independent Equipment used: Rolling walker (2 wheeled)             General transfer comment: no physical assistance to power up to standing, no loss of balance noted    Balance Overall balance assessment: Modified Independent                                         ADL either performed or assessed with clinical judgement   ADL Overall ADL's : At baseline                                       General ADL Comments: patient ambulates with rolling walker with no physical  assistance, does require assist to don sock however pt reports she doesn't wear socks t home and wears slide on shoes.                  Pertinent Vitals/Pain Pain Assessment: No/denies pain     Hand Dominance Right   Extremity/Trunk Assessment Upper Extremity Assessment Upper Extremity Assessment: Overall WFL for tasks assessed   Lower Extremity Assessment Lower Extremity Assessment: Defer to PT evaluation   Cervical / Trunk Assessment Cervical / Trunk Assessment: Normal   Communication Communication Communication: No difficulties   Cognition Arousal/Alertness: Awake/alert Behavior During Therapy: WFL for tasks assessed/performed Overall Cognitive Status: Within Functional Limits for tasks assessed                                                Home Living Family/patient expects to be discharged to:: Private residence Living Arrangements: Alone Available Help at Discharge: Family (patient states her ex-husband lives upstairs) Type of Home: House Home Access: Stairs to enter Technical brewer of Steps: 1+1 Entrance Stairs-Rails: None Home Layout: One level     Bathroom Shower/Tub: Gaffer  Bathroom Toilet: Pharmacist, community: Yes   Home Equipment: Cane - single point;Bedside commode;Grab bars - tub/shower;Shower seat - built in;Walker - 2 wheels          Prior Functioning/Environment Level of Independence: Independent with assistive device(s)        Comments: pt uses RW at baseline for household and community mobility        OT Problem List: Decreased activity tolerance         OT Goals(Current goals can be found in the care plan section) Acute Rehab OT Goals Patient Stated Goal: return home to her dog OT Goal Formulation: All assessment and education complete, DC therapy   AM-PAC OT "6 Clicks" Daily Activity     Outcome Measure Help from another person eating meals?: None Help from another person  taking care of personal grooming?: None Help from another person toileting, which includes using toliet, bedpan, or urinal?: None Help from another person bathing (including washing, rinsing, drying)?: None Help from another person to put on and taking off regular upper body clothing?: None Help from another person to put on and taking off regular lower body clothing?: None 6 Click Score: 24   End of Session Equipment Utilized During Treatment: Rolling walker;Gait belt Nurse Communication: Mobility status  Activity Tolerance: Patient tolerated treatment well Patient left: in chair;with call bell/phone within reach;with chair alarm set  OT Visit Diagnosis: Other abnormalities of gait and mobility (R26.89)                Time: [id number] OT Time Calculation (min): 20 min Charges:  OT General Charges $OT Visit: 1 Visit OT Evaluation $OT Eval Low Complexity: 1 Low  Marlyce Huge OT OT pager: [phone]  Carmelia Roller 01/15/2020, 1:51 PM

## 2020-01-15 NOTE — Progress Notes (Signed)
PROGRESS NOTE    Jessica Dennis  [id number] DOB: 12/31/1932 DOA: 01/12/2020 PCP: Ann Held, DO    Chief Complaint  Patient presents with  . Abnormal Lab    Brief Narrative: 86 year old female with past medical history of gastroesophageal reflux disease, hypertension, hyperlipidemia, hypothyroidism, alcoholic cirrhosis, insulin-dependent diabetes mellitus type2,diabetic neuropathy, GAVE, diastolic congestiveheart failure(Echo 11/2019 EF 60-65%)who presented to [place] emergency department on 12/21 with shortness of breath. Patient was found to have a substantial right-sided pleural effusion thought to be contributing to the patient's shortness of breath. Patient was administered albumin due to concerns for transient hypotension. Due to perceived need for thoracentesis the hospitalist group was called and the patient was accepted to [facility] for further care. She underwent thoracentesis on 12/23 and 2 lit of fluid removed and fluid sent for analysis.  Pleural fluid cultures negative  set so far.  Patient weaned off oxygen at this time.  Assessment & Plan:   Principal Problem:   Acute on chronic diastolic CHF (congestive heart failure) (HCC) Active Problems:   Hypothyroidism   Type 2 diabetes mellitus with hyperglycemia, with long-term current use of insulin (HCC)   GERD without esophagitis   Essential hypertension   Alcoholic cirrhosis of liver with ascites (HCC)   Portal hypertensive gastropathy (HCC)   Shortness of breath   Pleural effusion on right   Right large pleural effusion differential include transudate from liver cirrhosis. Vs from para pneumonic effusion from CAP vs  From acute on chronic diastolic CHF.  S/p thoracentesis and 2l it of fluid taken out.  Fluid sent for analysis.  Fluid cultures negative so far probably transudate from liver cirrhosis.  She is weaned off oxygen at this time and is currently on room air No  indication of antibiotics at this time.    Mild acute on chronic diastolic heart failure:  Currently on IV lasix 20 mg BID.  Diuresed about 3.7 lit of fluid in the last 24 hours. .  Continue the same dose of lasix.  Creatinine stable at 1.1.  Transition to oral Lasix today.   Hypothyroidism Continue with Synthroid.   Type 2 diabetes mellitus. CBG (last 3)  Recent Labs    01/14/20 2124 01/15/20 0741 01/15/20 1204  GLUCAP 264* 174* 270*   Resume SSI. No changes in meds.    Liver cirrhosis, trace ascites ascites found Continue with Lasix and spironolactone.    GERD Continue  PPI.   Anemia of chronic disease Probably secondary to liver cirrhosis. Hemoglobin on admission at 6.3 s/p 1 unit of PRBC transfusion and hemoglobin has improved to 7.8. We will check for fecal occult blood as she is prone for varices in view of her history of liver cirrhosis. IV iron infusion ordered by oncology.  Pancytopenia probably secondary to liver cirrhosis.   Hypokalemia Replaced   1.1 cm hypodensity within the pancreatic head with no associated main pancreatic duct dilatation  MRI of the liver ordered for further evaluation Motion degraded study. 3 cystic lesions in the pancreas measuring up to 25 mm in maximum dimension. Lesions are incompletely characterized due to motion degradation on the current study. No overtly suspicious enhancement attributable to any of these lesions within limitations of the motion degradation. consider MRI in 6 months to ensure stability    DVT prophylaxis: SCDs Code Status: DNR Family Communication: discussed with family over the phone.  Disposition:   Status is: Inpatient  Remains inpatient appropriate because:Ongoing diagnostic testing needed  not appropriate for outpatient work up, Unsafe d/c plan and IV treatments appropriate due to intensity of illness or inability to take PO   Dispo: The patient is from: Home              Anticipated d/c is  to: Home              Anticipated d/c date is: 1 day              Patient currently is not medically stable to d/c.       Consultants:   IR  Hematology     Procedures: Thoracentesis   MRI liver.    Antimicrobials: none.    Subjective: Patient denies any chest pain or shortness of breath at this time.  Objective: Vitals:   01/14/20 2122 01/15/20 0550 01/15/20 1000 01/15/20 1326  BP: (!) 108/53 110/69 125/69 (!) 102/39  Pulse: 95 (!) 108 92 67  Resp: 18 16  17   Temp: 98.5 F (36.9 C) 98.1 F (36.7 C)  98 F (36.7 C)  TempSrc:    Oral  SpO2: 94% 91%  92%  Weight:      Height:        Intake/Output Summary (Last 24 hours) at 01/15/2020 1531 Last data filed at 01/15/2020 1400 Gross per 24 hour  Intake 580 ml  Output 800 ml  Net -220 ml   Filed Weights   01/12/20 1828  Weight: 81.6 kg    Examination:  General exam: Alert and comfortable, not in distress at this time Respiratory system: Diminished air entry at bases, no wheezing or rhonchi  cardiovascular system: S1-S2 heard, regular rate rhythm, no JVD, no pedal edema Gastrointestinal system: Abdomen is soft, nontender nondistended bowel sounds normal Central nervous system: Alert and oriented, grossly nonfocal Extremities: No cyanosis Skin: No rashes seen  psychiatry: Mood is appropriate   Data Reviewed: I have personally reviewed following labs and imaging studies  CBC: Recent Labs  Lab 01/12/20 1241 01/12/20 1815 01/13/20 0610 01/13/20 0847 01/14/20 0618 01/15/20 0554  WBC 4.7 5.1 2.5*  --  3.5* 3.0*  NEUTROABS 3.6 3.5 1.6*  --   --   --   HGB 7.6 Repeated and verified X2.* 8.1* 6.3* 6.3* 7.8* 7.8*  HCT 24.1* 27.5* 21.5* 22.0* 25.4* 26.0*  MCV 75.9* 80.4 81.7  --  81.9 82.5  PLT 60.0* 62* 42*  --  46* 41*    Basic Metabolic Panel: Recent Labs  Lab 01/12/20 1241 01/12/20 1815 01/13/20 0610 01/14/20 0618 01/15/20 0554  NA 138 140 141 141 141  K 3.9 3.3* 3.4* 3.2* 3.5  CL 102  104 105 103 104  CO2 28 25 23 24 26   GLUCOSE 325* 105* 181* 153* 179*  BUN 23 23 22 19 19   CREATININE 1.39* 1.30* 1.11* 1.11* 1.14*  CALCIUM 8.7 9.2 9.2 8.8* 8.5*  MG  --   --  2.2  --   --     GFR: Estimated Creatinine Clearance: 39.6 mL/min (A) (by C-G formula based on SCr of 1.14 mg/dL (H)).  Liver Function Tests: Recent Labs  Lab 01/12/20 1241 01/13/20 0610 01/14/20 0614  AST 34 33 36  ALT 17 17 19   ALKPHOS 97 72 70  BILITOT 1.3* 1.7* 2.4*  PROT 5.5* 5.6* 5.5*  ALBUMIN 3.2* 3.7 3.3*    CBG: Recent Labs  Lab 01/14/20 1120 01/14/20 1710 01/14/20 2124 01/15/20 0741 01/15/20 1204  GLUCAP 149* 184* 264* 174* 270*  Recent Results (from the past 240 hour(s))  Resp Panel by RT-PCR (Flu A&B, Covid) Nasopharyngeal Swab     Status: None   Collection Time: 01/12/20  6:30 PM   Specimen: Nasopharyngeal Swab; Nasopharyngeal(NP) swabs in vial transport medium  Result Value Ref Range Status   SARS Coronavirus 2 by RT PCR NEGATIVE NEGATIVE Final    Comment: (NOTE) SARS-CoV-2 target nucleic acids are NOT DETECTED.  The SARS-CoV-2 RNA is generally detectable in upper respiratory specimens during the acute phase of infection. The lowest concentration of SARS-CoV-2 viral copies this assay can detect is 138 copies/mL. A negative result does not preclude SARS-Cov-2 infection and should not be used as the sole basis for treatment or other patient management decisions. A negative result may occur with  improper specimen collection/handling, submission of specimen other than nasopharyngeal swab, presence of viral mutation(s) within the areas targeted by this assay, and inadequate number of viral copies(<138 copies/mL). A negative result must be combined with clinical observations, patient history, and epidemiological information. The expected result is Negative.  Fact Sheet for Patients:  EntrepreneurPulse.com.au  Fact Sheet for Healthcare Providers:   IncredibleEmployment.be  This test is no t yet approved or cleared by the [place] FDA and  has been authorized for detection and/or diagnosis of SARS-CoV-2 by FDA under an Emergency Use Authorization (EUA). This EUA will remain  in effect (meaning this test can be used) for the duration of the COVID-19 declaration under Section 564(b)(1) of the Act, 21 U.S.C.section 360bbb-3(b)(1), unless the authorization is terminated  or revoked sooner.       Influenza A by PCR NEGATIVE NEGATIVE Final   Influenza B by PCR NEGATIVE NEGATIVE Final    Comment: (NOTE) The Xpert Xpress SARS-CoV-2/FLU/RSV plus assay is intended as an aid in the diagnosis of influenza from Nasopharyngeal swab specimens and should not be used as a sole basis for treatment. Nasal washings and aspirates are unacceptable for Xpert Xpress SARS-CoV-2/FLU/RSV testing.  Fact Sheet for Patients: EntrepreneurPulse.com.au  Fact Sheet for Healthcare Providers: IncredibleEmployment.be  This test is not yet approved or cleared by the [place] FDA and has been authorized for detection and/or diagnosis of SARS-CoV-2 by FDA under an Emergency Use Authorization (EUA). This EUA will remain in effect (meaning this test can be used) for the duration of the COVID-19 declaration under Section 564(b)(1) of the Act, 21 U.S.C. section 360bbb-3(b)(1), unless the authorization is terminated or revoked.  Performed at [facility], [address] [place], [place] [address]   Body fluid culture     Status: None (Preliminary result)   Collection Time: 01/14/20 11:00 AM   Specimen: PATH Cytology Pleural fluid  Result Value Ref Range Status   Specimen Description   Final    PLEURAL Performed at [place] [address], [place], [place] [address]    Special Requests   Final    NONE Performed at [facility], [place]  [address], [place], [place] [address]    Gram Stain   Final    RARE WBC PRESENT, PREDOMINANTLY MONONUCLEAR NO ORGANISMS SEEN    Culture   Final    NO GROWTH < 24 HOURS Performed at [facility], [place] [address] [place],  [address]    Report Status PENDING  Incomplete         Radiology Studies: DG Chest 1 View  Result Date: 01/14/2020 CLINICAL DATA:  Status post thoracentesis EXAM: CHEST  1 VIEW  COMPARISON:  January 12, 2020 FINDINGS: No pneumothorax. Right pleural effusion significantly smaller following thoracentesis. There is residual pleural effusion on the right with small pleural effusion on the left. There is bibasilar atelectasis. Heart is upper normal in size with pulmonary vascularity normal. No adenopathy. No bone lesions. IMPRESSION: No pneumothorax. Right pleural effusion significantly smaller following thoracentesis. Fairly small pleural effusions present bilaterally with bibasilar atelectasis. Stable cardiac silhouette. Electronically Signed   By: Lowella Grip III M.D.   On: 01/14/2020 11:10   MR LIVER W WO CONTRAST  Result Date: 01/15/2020 CLINICAL DATA:  11 mm pancreatic lesion on CT. EXAM: MRI ABDOMEN WITHOUT AND WITH CONTRAST TECHNIQUE: Multiplanar multisequence MR imaging of the abdomen was performed both before and after the administration of intravenous contrast. CONTRAST:  3mL GADAVIST GADOBUTROL 1 MMOL/ML IV SOLN COMPARISON:  CT 01/12/2020. FINDINGS: Lower chest: Bilateral pleural effusions, right greater than left with bibasilar collapse/consolidative opacity, right greater than left. Hepatobiliary: Postcontrast imaging of the liver is motion degraded. Within this limitation, no suspicious arterial phase hyperenhancement is discernible. Gallbladder unremarkable. No intrahepatic or extrahepatic biliary dilation. Pancreas: 12 mm well-defined cystic lesion identified in the head of pancreas. Although postcontrast imaging is motion degraded, no  suspicious enhancement can be identified in the lesion. A second 2.5 x 1.8 cm multicystic lesion is identified in the tail of pancreas on axial T2 haste image 19 of series 28. A third 10 mm cystic lesion is identified in the tail of pancreas on 18/28. No substantial dilatation of the main pancreatic duct. Spleen:  Splenomegaly at 16.1 cm craniocaudal length. Adrenals/Urinary Tract: No adrenal nodule or mass. No suspicious renal mass lesion although postcontrast imaging is motion degraded. No hydronephrosis. Stomach/Bowel: Stomach is nondistended. Duodenum is normally positioned as is the ligament of Treitz. No small bowel or colonic dilatation within the visualized abdomen. Vascular/Lymphatic: No abdominal aortic aneurysm. Portal vein, superior mesenteric vein, and splenic vein are patent. Recanalization of the paraumbilical vein evident with paraesophageal varices noted. Other:  No substantial intraperitoneal free fluid. Musculoskeletal: No focal suspicious marrow enhancement within the visualized bony anatomy. IMPRESSION: 1. Motion degraded study. 3 cystic lesions in the pancreas measuring up to 25 mm in maximum dimension. Lesions are incompletely characterized due to motion degradation on the current study. No overtly suspicious enhancement attributable to any of these lesions within limitations of the motion degradation. S today's exam is limited, consider MRI in 6 months to ensure stability. 2. Cirrhotic liver morphology without suspicious arterial phase hyperenhancement on limited postcontrast imaging. Splenomegaly, recanalization of the paraumbilical vein, and paraesophageal varices are consistent with portal venous hypertension. 3. Bilateral pleural effusions, right greater than left with bibasilar collapse/consolidative opacity, right greater than left. Electronically Signed   By: Misty Stanley M.D.   On: 01/15/2020 05:38   US THORACENTESIS ASP PLEURAL SPACE W/IMG GUIDE  Result Date:  01/14/2020 INDICATION: Patient with history of GERD, hypertension, alcoholic cirrhosis, diabetes, congestive heart failure, dyspnea, right pleural effusion. Request received for diagnostic and therapeutic right thoracentesis. EXAM: ULTRASOUND GUIDED DIAGNOSTIC AND THERAPEUTIC RIGHT THORACENTESIS MEDICATIONS: 1% lidocaine to skin and subcutaneous tissue COMPLICATIONS: None immediate. PROCEDURE: An ultrasound guided thoracentesis was thoroughly discussed with the patient and questions answered. The benefits, risks, alternatives and complications were also discussed. The patient understands and wishes to proceed with the procedure. Written consent was obtained. Ultrasound was performed to localize and mark an adequate pocket of fluid in the right chest. The area was then prepped and draped in the normal sterile  fashion. 1% Lidocaine was used for local anesthesia. Under ultrasound guidance a 6 Fr Safe-T-Centesis catheter was introduced. Thoracentesis was performed. The catheter was removed and a dressing applied. FINDINGS: A total of approximately 2 liters of slightly hazy, yellow fluid was removed. Samples were sent to the laboratory as requested by the clinical team. IMPRESSION: Successful ultrasound guided diagnostic and therapeutic right thoracentesis yielding 2 liters of pleural fluid. Read by: Rowe Robert, PA-C Electronically Signed   By: Jerilynn Mages.  Shick M.D.   On: 01/14/2020 11:04        Scheduled Meds: . folic acid  1 mg Oral Daily  . furosemide  20 mg Intravenous BID  . insulin aspart  0-15 Units Subcutaneous TID AC & HS  . latanoprost  1 drop Both Eyes QHS  . levothyroxine  75 mcg Oral Q0600  . metoprolol succinate  12.5 mg Oral Daily  . pantoprazole  40 mg Oral Daily  . spironolactone  12.5 mg Oral Daily   Continuous Infusions:   LOS: 3 days        Hosie Poisson, MD [facility]   To contact the attending provider between 7A-7P or the covering provider during after hours 7P-7A,  please log into the web site [email or web address] and access using universal [place] password for that web site. If you do not have the password, please call the hospital operator.  01/15/2020, 3:31 PM

## 2020-01-15 NOTE — Consult Note (Signed)
Referral MD  Reason for Referral: Anemia secondary to iron deficiency likely from variceal bleeding from cirrhosis; thrombocytopenia secondary to cirrhosis and splenomegaly.  Chief Complaint  Patient presents with  . Abnormal Lab  : I am in the hospital because of weakness and shortness of breath.  HPI: Jessica Dennis is well-known to me.  She is a nice 86 year old [place] female.  I have known her for several years.  She has cirrhosis from NASH.  She has anemia secondary to iron deficiency from variceal bleeding.  She has not been seen in the office probably for a year and a half.  She was admitted couple days ago.  She subsequently was found to be quite anemic.  It was felt that she may have had some congestive heart failure.  She had a large right  pleural effusion.  When she was admitted, her white cell count was 2.5.  Hemoglobin 6.3.  Platelet count 42,000.  She has got 1 unit of platelets. She had a critically low iron with a ferritin of only 11 with an iron saturation of 6%.  I would think that she had IV iron.  She had a CT of the abdomen pelvis.  She has CT angiogram of the chest.  There is no pulmonary embolism.  Showed large right pleural effusion.  She had cirrhotic liver with portal hypertension.  There was a one-point centimeter lesion within the pancreatic head.  She had MRI of the liver.  This showed 3 cystic lesion in the pancreas.  Nothing looks like it was suspicious for malignancy.  She had cirrhosis of the liver.  She had splenomegaly.  She had 2 L of fluid removed from the right lung.  Studies are pending.  I suspect that she probably has a low erythropoietin level.  She had an echocardiogram back in November.  This showed an ejection fraction of 60-65%.  Her valves all look pretty good.  Today, her white cell count is 3.  Hemoglobin 7.8.  Platelet count 41,000.  She has not noted any obvious bleeding.  Again I know that she has had some variceal bleeding in the past.  She  has had no fever.  There has been no problems with the coronavirus.  She has had no issues with weight loss or weight gain.  She is not a vegetarian.  Overall, she actually does not look all that bad.  Currently, her performance status is probably ECOG 1-2.    Past Medical History:  Diagnosis Date  . Anemia   . Angiodysplasia of ascending colon 10/25/2014  . Anxiety   . Arthritis   . Borderline diabetes   . Cancer of the skin, basal cell 09/03/2012  . Cirrhosis ([place])   . Diabetes mellitus without complication ([place])   . Diverticular disease   . 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 GERD (gastroesophageal reflux disease)   . Hypertension   . Iron deficiency anemia due to chronic blood loss 01/19/2015  . Portal hypertensive gastropathy ([place]) 08/02/2016   ? Some GAVE also  . Thyroid disease   :  Past Surgical History:  Procedure Laterality Date  . ABDOMINAL HYSTERECTOMY    . APPENDECTOMY    . COLONOSCOPY    . ESOPHAGOGASTRODUODENOSCOPY    . IR PARACENTESIS  05/25/2016  . LEG SKIN LESION  BIOPSY / EXCISION  12/11/14  . MOHS SURGERY     ankle  . TONSILLECTOMY    . TOTAL HIP ARTHROPLASTY Bilateral 1993, 2006  .  UPPER GASTROINTESTINAL ENDOSCOPY  09/09/2017  :   Current Facility-Administered Medications:  .  furosemide (LASIX) injection 20 mg, 20 mg, Intravenous, BID, Shalhoub, Sherryll Burger, MD, 20 mg at 01/14/20 1815 .  insulin aspart (novoLOG) injection 0-15 Units, 0-15 Units, Subcutaneous, TID AC & HS, Shalhoub, Sherryll Burger, MD, 8 Units at 01/14/20 2155 .  latanoprost (XALATAN) 0.005 % ophthalmic solution 1 drop, 1 drop, Both Eyes, QHS, Shalhoub, Sherryll Burger, MD, 1 drop at 01/14/20 2155 .  levothyroxine (SYNTHROID) tablet 75 mcg, 75 mcg, Oral, Q0600, Vernelle Emerald, MD, 75 mcg at 01/15/20 0549 .  lip balm (CARMEX) ointment, , Topical, PRN, Hosie Poisson, MD .  LORazepam (ATIVAN) tablet 0.5 mg, 0.5 mg, Oral, Daily PRN, Shalhoub, Sherryll Burger, MD .  melatonin tablet 10 mg,  10 mg, Oral, QHS PRN, Cyd Silence, Sherryll Burger, MD, 10 mg at 01/14/20 2201 .  metoprolol succinate (TOPROL-XL) 24 hr tablet 12.5 mg, 12.5 mg, Oral, Daily, Shalhoub, Sherryll Burger, MD, 12.5 mg at 01/13/20 0837 .  ondansetron (ZOFRAN) tablet 4 mg, 4 mg, Oral, Q6H PRN **OR** ondansetron (ZOFRAN) injection 4 mg, 4 mg, Intravenous, Q6H PRN, Shalhoub, Sherryll Burger, MD .  pantoprazole (PROTONIX) EC tablet 40 mg, 40 mg, Oral, Daily, Shalhoub, Sherryll Burger, MD, 40 mg at 01/13/20 0837 .  polyethylene glycol (MIRALAX / GLYCOLAX) packet 17 g, 17 g, Oral, Daily PRN, Shalhoub, Sherryll Burger, MD .  potassium chloride SA (KLOR-CON) CR tablet 40 mEq, 40 mEq, Oral, BID, Hosie Poisson, MD, 40 mEq at 01/14/20 2154 .  spironolactone (ALDACTONE) tablet 12.5 mg, 12.5 mg, Oral, Daily, Shalhoub, Sherryll Burger, MD, 12.5 mg at 01/13/20 [id number]:  . furosemide  20 mg Intravenous BID  . insulin aspart  0-15 Units Subcutaneous TID AC & HS  . latanoprost  1 drop Both Eyes QHS  . levothyroxine  75 mcg Oral Q0600  . metoprolol succinate  12.5 mg Oral Daily  . pantoprazole  40 mg Oral Daily  . potassium chloride  40 mEq Oral BID  . spironolactone  12.5 mg Oral Daily  :  Allergies  Allergen Reactions  . Tizanidine     Hallucinate, confused   :  Family History  Problem Relation Age of Onset  . Bladder Cancer Father   . Heart attack Father   . Diabetes Mother   . Colon cancer Neg Hx   . Colon polyps Neg Hx   . Esophageal cancer Neg Hx   . Rectal cancer Neg Hx   . Stomach cancer Neg Hx   :  Social History   Socioeconomic History  . Marital status: Divorced    Spouse name: Not on file  . Number of children: 2  . Years of education: Not on file  . Highest education level: Not on file  Occupational History  . Occupation: retired  Tobacco Use  . Smoking status: Never Smoker  . Smokeless tobacco: Never Used  Vaping Use  . Vaping Use: Never used  Substance and Sexual Activity  . Alcohol use: Yes    Comment: weekly  . Drug use: No  .  Sexual activity: Not on file  Other Topics Concern  . Not on file  Social History Narrative   The patient is divorced. She is originally from [place]. She has 2 sons. 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   08/10/2014   Social Determinants of Health   Financial Resource Strain: Not on file  Food Insecurity: Not on file  Transportation  Needs: Not on file  Physical Activity: Not on file  Stress: Not on file  Social Connections: Not on file  Intimate Partner Violence: Not on file  :  Review of Systems  Constitutional: Positive for malaise/fatigue.  HENT: Negative.   Eyes: Negative.   Respiratory: Positive for cough and shortness of breath.   Cardiovascular: Positive for chest pain.  Gastrointestinal: Positive for blood in stool.  Genitourinary: Negative.   Musculoskeletal: Negative.   Skin: Negative.   Neurological: Negative.   Endo/Heme/Allergies: Negative.   Psychiatric/Behavioral: Negative.      Exam:  This is a fairly well-developed well-nourished white female in no obvious distress.  Vital signs show temperature of 98.1.  Pulse 108.  Blood pressure 110/69.  Her head and neck exam shows no scleral icterus.  She has no adenopathy in the neck.  Thyroid is nonpalpable.  Lungs are clear bilaterally.  She may have some decrease over on the right side.  No wheezes are noted.  Cardiac exam regular rate and rhythm with an occasional extra beat.  She has a 2/6 systolic ejection murmur.  Abdomen is soft.  Her spleen tip is palpable at the left costal margin.  There is no obvious hepatomegaly.  Extremities shows compression stockings on her legs.  Skin exam shows occasional ecchymoses.  Neurological exam is nonfocal.  Patient Vitals for the past 24 hrs:  BP Temp Temp src Pulse Resp SpO2  01/15/20 0550 110/69 98.1 F (36.7 C) -- (!) 108 16 91 %  01/14/20 2122 (!) 108/53 98.5 F (36.9 C) -- 95 18 94 %  01/14/20 1425 (!) 117/55 98.7 F (37.1 C) Oral  89 16 91 %  01/14/20 1104 (!) 111/56 -- -- -- -- --  01/14/20 1042 (!) 115/57 -- -- -- -- --  01/14/20 1020 (!) 110/54 -- -- -- -- --     Recent Labs    01/14/20 0618 01/15/20 0554  WBC 3.5* 3.0*  HGB 7.8* 7.8*  HCT 25.4* 26.0*  PLT 46* 41*   Recent Labs    01/14/20 0618 01/15/20 0554  NA 141 141  K 3.2* 3.5  CL 103 104  CO2 24 26  GLUCOSE 153* 179*  BUN 19 19  CREATININE 1.11* 1.14*  CALCIUM 8.8* 8.5*    Blood smear review: None  Pathology: None    Assessment and Plan: JessicaNoto is a very nice 86 year old Caucasian female.  She has marked anemia.  She has a large left right pleural effusion.  I have believe that this effusion is not related to malignancy.  She never smoked.  Studies have been sent off.  It is her anemia that is the problem as always.  Her thrombocytopenia is chronic.  This is from the cirrhosis and splenomegaly.  I am sure that this is all related to sequestration.  She clearly needs IV iron.  I am not sure if she got any in the hospital.  We will going order this.  It would not surprise me if her erythropoietin level is on the low side.  I would check her erythropoietin level.  She may need to have all ESA.  Her hemoglobin is 7.8.  I would have a low threshold to transfuse her again.  At 86 years old, she probably needs to have a hemoglobin low but higher than 8.  It was nice to see her again.  She is always so gracious.  She was has a good attitude.  I know that she is getting  fantastic care from all staff on [address].  I appreciate all their help.  Lattie Haw, MD  Jessica Dennis 2:6-7

## 2020-01-15 NOTE — Care Management Important Message (Signed)
Important Message  Patient Details IM Letter given to the Patient. Name: Jessica Dennis MRN: [id number] Date of Birth: 26-Nov-1932   Medicare Important Message Given:  Yes     Kerin Salen 01/15/2020, 10:26 AM

## 2020-01-16 DIAGNOSIS — K219 Gastro-esophageal reflux disease without esophagitis: Secondary | ICD-10-CM | POA: Diagnosis not present

## 2020-01-16 DIAGNOSIS — I10 Essential (primary) hypertension: Secondary | ICD-10-CM | POA: Diagnosis not present

## 2020-01-16 DIAGNOSIS — I5033 Acute on chronic diastolic (congestive) heart failure: Secondary | ICD-10-CM | POA: Diagnosis not present

## 2020-01-16 DIAGNOSIS — K7031 Alcoholic cirrhosis of liver with ascites: Secondary | ICD-10-CM | POA: Diagnosis not present

## 2020-01-16 LAB — CBC WITH DIFFERENTIAL/PLATELET
Abs Immature Granulocytes: 0.02 10*3/uL (ref 0.00–0.07)
Basophils Absolute: 0 10*3/uL (ref 0.0–0.1)
Basophils Relative: 0 %
Eosinophils Absolute: 0.1 10*3/uL (ref 0.0–0.5)
Eosinophils Relative: 5 %
HCT: 26.5 % — ABNORMAL LOW (ref 36.0–46.0)
Hemoglobin: 8.1 g/dL — ABNORMAL LOW (ref 12.0–15.0)
Immature Granulocytes: 1 %
Lymphocytes Relative: 22 %
Lymphs Abs: 0.6 10*3/uL — ABNORMAL LOW (ref 0.7–4.0)
MCH: 25.2 pg — ABNORMAL LOW (ref 26.0–34.0)
MCHC: 30.6 g/dL (ref 30.0–36.0)
MCV: 82.6 fL (ref 80.0–100.0)
Monocytes Absolute: 0.3 10*3/uL (ref 0.1–1.0)
Monocytes Relative: 10 %
Neutro Abs: 1.7 10*3/uL (ref 1.7–7.7)
Neutrophils Relative %: 62 %
Platelets: 40 10*3/uL — ABNORMAL LOW (ref 150–400)
RBC: 3.21 MIL/uL — ABNORMAL LOW (ref 3.87–5.11)
RDW: 19.8 % — ABNORMAL HIGH (ref 11.5–15.5)
WBC: 2.8 10*3/uL — ABNORMAL LOW (ref 4.0–10.5)
nRBC: 0 % (ref 0.0–0.2)

## 2020-01-16 LAB — GLUCOSE, CAPILLARY
Glucose-Capillary: 152 mg/dL — ABNORMAL HIGH (ref 70–99)
Glucose-Capillary: 241 mg/dL — ABNORMAL HIGH (ref 70–99)
Glucose-Capillary: 239 mg/dL — ABNORMAL HIGH (ref 70–99)
Glucose-Capillary: 230 mg/dL — ABNORMAL HIGH (ref 70–99)

## 2020-01-16 LAB — RETICULOCYTES
Immature Retic Fract: 22.5 % — ABNORMAL HIGH (ref 2.3–15.9)
RBC.: 3.19 MIL/uL — ABNORMAL LOW (ref 3.87–5.11)
Retic Count, Absolute: 82.3 10*3/uL (ref 19.0–186.0)
Retic Ct Pct: 2.6 % (ref 0.4–3.1)

## 2020-01-16 LAB — AMMONIA: Ammonia: 71 umol/L — ABNORMAL HIGH (ref 9–35)

## 2020-01-16 MED ORDER — FOLIC ACID 1 MG PO TABS: 1.0000 mg | ORAL_TABLET | Freq: Every day | ORAL | Status: DC

## 2020-01-16 MED ORDER — LACTULOSE 10 GM/15ML PO SOLN
20.0000 g | Freq: Two times a day (BID) | ORAL | Status: DC
  Administered 2020-01-16 – 2020-01-17 (×3): 20 g via ORAL
  Filled 2020-01-16 (×3): qty 30

## 2020-01-16 MED ORDER — FUROSEMIDE 20 MG PO TABS
20.0000 mg | ORAL_TABLET | Freq: Two times a day (BID) | ORAL | Status: DC
  Administered 2020-01-16 – 2020-01-17 (×2): 20 mg via ORAL
  Filled 2020-01-16 (×2): qty 1

## 2020-01-16 NOTE — Plan of Care (Signed)

## 2020-01-16 NOTE — TOC Progression Note (Signed)
Transition of Care [facility]) - Progression Note    Patient Details  Name: Jessica Dennis MRN: [id number] Date of Birth: 06-Mar-1932  [facility] CM/SW Contact  Joaquin Courts, RN Phone Number: 01/16/2020, 12:33 PM  Clinical Narrative:    CM notified [place] [facility] that patient will dc today with orders for HHPT and RN.   Expected Discharge Plan: [place] Barriers to Discharge: Continued Medical Work up  Expected Discharge Plan and Services Expected Discharge Plan: [place]   Discharge Planning Services: CM Consult   Living arrangements for the past 2 months: Single Family Home Expected Discharge Date: 01/16/20                                     Social Determinants of Health (SDOH) Interventions    Readmission Risk Interventions No flowsheet data found.

## 2020-01-16 NOTE — Progress Notes (Signed)
PROGRESS NOTE    Jessica Dennis  [id number] DOB: 24-Aug-1932 DOA: 01/12/2020 PCP: Ann Held, DO    Chief Complaint  Patient presents with  . Abnormal Lab    Brief Narrative: 86 year old female with past medical history of gastroesophageal reflux disease, hypertension, hyperlipidemia, hypothyroidism, alcoholic cirrhosis, insulin-dependent diabetes mellitus type2,diabetic neuropathy, GAVE, diastolic congestiveheart failure(Echo 11/2019 EF 60-65%)who presented to [place] [facility] emergency department on 12/21 with shortness of breath. Patient was found to have a substantial right-sided pleural effusion thought to be contributing to the patient's shortness of breath. Patient was administered albumin due to concerns for transient hypotension. Due to perceived need for thoracentesis the hospitalist group was called and the patient was accepted to [facility] for further care. She underwent thoracentesis on 12/23 and 2 lit of fluid removed and fluid sent for analysis.  Pleural fluid cultures negative  set so far.  Patient weaned off oxygen at this time. Pt was scheduled to be discharged today. She was alert and answered all questions appropriately. But later on this after noon she reports she is too weak to be discharged, does not want to go home. She was slightly confused. NH3 level was obtained and it was high. Discharge was discontinued and she was started on lactulose.   Assessment & Plan:   Principal Problem:   Acute on chronic diastolic CHF (congestive heart failure) (HCC) Active Problems:   Hypothyroidism   Type 2 diabetes mellitus with hyperglycemia, with long-term current use of insulin (HCC)   GERD without esophagitis   Essential hypertension   Alcoholic cirrhosis of liver with ascites (HCC)   Portal hypertensive gastropathy (HCC)   Shortness of breath   Pleural effusion on right   Right large pleural effusion differential include transudate  from liver cirrhosis. Vs from para pneumonic effusion from CAP vs  From acute on chronic diastolic CHF.  S/p thoracentesis and 2l it of fluid taken out.  Fluid sent for analysis.  Fluid cultures negative so far probably transudate from liver cirrhosis.  She is weaned off oxygen at this time and is currently on room air No indication of antibiotics at this time.    Mild acute on chronic diastolic heart failure:  Currently on IV lasix 20 mg BID transitioned to oral lasix.  Diuresed about 4 lit of fluid in the last 24 hours. .  Continue the same dose of lasix.  Creatinine stable at 1.1.     Hypothyroidism Continue with Synthroid.   Type 2 diabetes mellitus. CBG (last 3)  Recent Labs    01/16/20 0730 01/16/20 1134 01/16/20 1706  GLUCAP 152* 241* 239*   cbgs are elevated today, will restart home meds   Liver cirrhosis, trace ascites ascites found Continue with Lasix and spironolactone.    GERD Continue  PPI.   Anemia of chronic disease Probably secondary to liver cirrhosis. Hemoglobin on admission at 6.3 s/p 1 unit of PRBC transfusion and hemoglobin has improved to 7.8. We will check for fecal occult blood as she is prone for varices in view of her history of liver cirrhosis. IV iron infusion ordered by oncology.  Pancytopenia probably secondary to liver cirrhosis.   Hypokalemia Replaced   1.1 cm hypodensity within the pancreatic head with no associated main pancreatic duct dilatation  MRI of the liver ordered for further evaluation Motion degraded study. 3 cystic lesions in the pancreas measuring up to 25 mm in maximum dimension. Lesions are incompletely characterized due to motion degradation on  the current study. No overtly suspicious enhancement attributable to any of these lesions within limitations of the motion degradation. consider MRI in 6 months to ensure stability.   Acute metabolic encephalopathy secondary to hepatic encephalopathy:  Ammonia level high,  started her on lactulose.  Recheck ammonia level tomorrow afternoon and recheck labs in am.      DVT prophylaxis: SCDs Code Status: DNR Family Communication: none at bedside, will call family and update.  Disposition:   Status is: Inpatient  Remains inpatient appropriate because:Unsafe d/c plan, Inpatient level of care appropriate due to severity of illness and pt confused from hepatic encephalopathy.    Dispo: The patient is from: Home              Anticipated d/c is to: Home              Anticipated d/c date is: 1 day              Patient currently is not medically stable to d/c.       Consultants:   IR  Hematology     Procedures: Thoracentesis   MRI liver.    Antimicrobials: none.    Subjective: Reports feeling weak and doe snot want to be discharged. .  Objective: Vitals:   01/15/20 1326 01/15/20 2153 01/16/20 0541 01/16/20 1138  BP: (!) 102/39 (!) 109/56 (!) 121/52 (!) 104/51  Pulse: 67 64 70 61  Resp: 17  16 17   Temp: 98 F (36.7 C) 98.2 F (36.8 C) (!) 97.5 F (36.4 C) 98.5 F (36.9 C)  TempSrc: Oral Oral    SpO2: 92% 96% 93% 91%  Weight:      Height:        Intake/Output Summary (Last 24 hours) at 01/16/2020 1831 Last data filed at 01/16/2020 1822 Gross per 24 hour  Intake 720 ml  Output 1100 ml  Net -380 ml   Filed Weights   01/12/20 1828  Weight: 81.6 kg    Examination:  General exam: alert and comfortable. Respiratory system: air entry fair, no wheezing heard. No tachypnea.  cardiovascular system: S1S2 RRR, no JVD,  Gastrointestinal system: Abdomen is soft, non tender non distended bowel sounds wnl.  Central nervous system: Alert but confused, non focal.  Extremities: No cyanosis.  Skin: no rashes seen.  psychiatry: Mood is appropriate   Data Reviewed: I have personally reviewed following labs and imaging studies  CBC: Recent Labs  Lab 01/12/20 1241 01/12/20 1815 01/13/20 0610 01/13/20 0847 01/14/20 0618  01/15/20 0554 01/16/20 0538  WBC 4.7 5.1 2.5*  --  3.5* 3.0* 2.8*  NEUTROABS 3.6 3.5 1.6*  --   --   --  1.7  HGB 7.6 Repeated and verified X2.* 8.1* 6.3* 6.3* 7.8* 7.8* 8.1*  HCT 24.1* 27.5* 21.5* 22.0* 25.4* 26.0* 26.5*  MCV 75.9* 80.4 81.7  --  81.9 82.5 82.6  PLT 60.0* 62* 42*  --  46* 41* 40*    Basic Metabolic Panel: Recent Labs  Lab 01/12/20 1241 01/12/20 1815 01/13/20 0610 01/14/20 0618 01/15/20 0554  NA 138 140 141 141 141  K 3.9 3.3* 3.4* 3.2* 3.5  CL 102 104 105 103 104  CO2 28 25 23 24 26   GLUCOSE 325* 105* 181* 153* 179*  BUN 23 23 22 19 19   CREATININE 1.39* 1.30* 1.11* 1.11* 1.14*  CALCIUM 8.7 9.2 9.2 8.8* 8.5*  MG  --   --  2.2  --   --  GFR: Estimated Creatinine Clearance: 39.6 mL/min (A) (by C-G formula based on SCr of 1.14 mg/dL (H)).  Liver Function Tests: Recent Labs  Lab 01/12/20 1241 01/13/20 0610 01/14/20 0614  AST 34 33 36  ALT 17 17 19   ALKPHOS 97 72 70  BILITOT 1.3* 1.7* 2.4*  PROT 5.5* 5.6* 5.5*  ALBUMIN 3.2* 3.7 3.3*    CBG: Recent Labs  Lab 01/15/20 1614 01/15/20 2152 01/16/20 0730 01/16/20 1134 01/16/20 1706  GLUCAP 280* 276* 152* 241* 239*     Recent Results (from the past 240 hour(s))  Resp Panel by RT-PCR (Flu A&B, Covid) Nasopharyngeal Swab     Status: None   Collection Time: 01/12/20  6:30 PM   Specimen: Nasopharyngeal Swab; Nasopharyngeal(NP) swabs in vial transport medium  Result Value Ref Range Status   SARS Coronavirus 2 by RT PCR NEGATIVE NEGATIVE Final    Comment: (NOTE) SARS-CoV-2 target nucleic acids are NOT DETECTED.  The SARS-CoV-2 RNA is generally detectable in upper respiratory specimens during the acute phase of infection. The lowest concentration of SARS-CoV-2 viral copies this assay can detect is 138 copies/mL. A negative result does not preclude SARS-Cov-2 infection and should not be used as the sole basis for treatment or other patient management decisions. A negative result may occur with   improper specimen collection/handling, submission of specimen other than nasopharyngeal swab, presence of viral mutation(s) within the areas targeted by this assay, and inadequate number of viral copies(<138 copies/mL). A negative result must be combined with clinical observations, patient history, and epidemiological information. The expected result is Negative.  Fact Sheet for Patients:  EntrepreneurPulse.com.au  Fact Sheet for Healthcare Providers:  IncredibleEmployment.be  This test is no t yet approved or cleared by the [place] FDA and  has been authorized for detection and/or diagnosis of SARS-CoV-2 by FDA under an Emergency Use Authorization (EUA). This EUA will remain  in effect (meaning this test can be used) for the duration of the COVID-19 declaration under Section 564(b)(1) of the Act, 21 U.S.C.section 360bbb-3(b)(1), unless the authorization is terminated  or revoked sooner.       Influenza A by PCR NEGATIVE NEGATIVE Final   Influenza B by PCR NEGATIVE NEGATIVE Final    Comment: (NOTE) The Xpert Xpress SARS-CoV-2/FLU/RSV plus assay is intended as an aid in the diagnosis of influenza from Nasopharyngeal swab specimens and should not be used as a sole basis for treatment. Nasal washings and aspirates are unacceptable for Xpert Xpress SARS-CoV-2/FLU/RSV testing.  Fact Sheet for Patients: EntrepreneurPulse.com.au  Fact Sheet for Healthcare Providers: IncredibleEmployment.be  This test is not yet approved or cleared by the [place] FDA and has been authorized for detection and/or diagnosis of SARS-CoV-2 by FDA under an Emergency Use Authorization (EUA). This EUA will remain in effect (meaning this test can be used) for the duration of the COVID-19 declaration under Section 564(b)(1) of the Act, 21 U.S.C. section 360bbb-3(b)(1), unless the authorization is terminated  or revoked.  Performed at [facility], [address] [place], [place] [address]   Body fluid culture     Status: None (Preliminary result)   Collection Time: 01/14/20 11:00 AM   Specimen: PATH Cytology Pleural fluid  Result Value Ref Range Status   Specimen Description   Final    PLEURAL Performed at [facility] [address] [place], [place] [address]    Special Requests   Final    NONE Performed at [facility], [address] [place], [place] [address]    Gram Stain   Final    RARE WBC PRESENT, PREDOMINANTLY MONONUCLEAR NO ORGANISMS SEEN    Culture   Final    NO GROWTH 2 DAYS Performed at [facility], [place] [address], [place], [place] [address]    Report Status PENDING  Incomplete         Radiology Studies: MR LIVER W WO CONTRAST  Result Date: 01/15/2020 CLINICAL DATA:  11 mm pancreatic lesion on CT. EXAM: MRI ABDOMEN WITHOUT AND WITH CONTRAST TECHNIQUE: Multiplanar multisequence MR imaging of the abdomen was performed both before and after the administration of intravenous contrast. CONTRAST:  73mL GADAVIST GADOBUTROL 1 MMOL/ML IV SOLN COMPARISON:  CT 01/12/2020. FINDINGS: Lower chest: Bilateral pleural effusions, right greater than left with bibasilar collapse/consolidative opacity, right greater than left. Hepatobiliary: Postcontrast imaging of the liver is motion degraded. Within this limitation, no suspicious arterial phase hyperenhancement is discernible. Gallbladder unremarkable. No intrahepatic or extrahepatic biliary dilation. Pancreas: 12 mm well-defined cystic lesion identified in the head of pancreas. Although postcontrast imaging is motion degraded, no suspicious enhancement can be identified in the lesion. A second 2.5 x 1.8 cm multicystic lesion is identified in the tail of pancreas on axial T2 haste image 19 of series 28. A third 10 mm cystic lesion is identified in the tail of pancreas on 18/28. No  substantial dilatation of the main pancreatic duct. Spleen:  Splenomegaly at 16.1 cm craniocaudal length. Adrenals/Urinary Tract: No adrenal nodule or mass. No suspicious renal mass lesion although postcontrast imaging is motion degraded. No hydronephrosis. Stomach/Bowel: Stomach is nondistended. Duodenum is normally positioned as is the ligament of Treitz. No small bowel or colonic dilatation within the visualized abdomen. Vascular/Lymphatic: No abdominal aortic aneurysm. Portal vein, superior mesenteric vein, and splenic vein are patent. Recanalization of the paraumbilical vein evident with paraesophageal varices noted. Other:  No substantial intraperitoneal free fluid. Musculoskeletal: No focal suspicious marrow enhancement within the visualized bony anatomy. IMPRESSION: 1. Motion degraded study. 3 cystic lesions in the pancreas measuring up to 25 mm in maximum dimension. Lesions are incompletely characterized due to motion degradation on the current study. No overtly suspicious enhancement attributable to any of these lesions within limitations of the motion degradation. S today's exam is limited, consider MRI in 6 months to ensure stability. 2. Cirrhotic liver morphology without suspicious arterial phase hyperenhancement on limited postcontrast imaging. Splenomegaly, recanalization of the paraumbilical vein, and paraesophageal varices are consistent with portal venous hypertension. 3. Bilateral pleural effusions, right greater than left with bibasilar collapse/consolidative opacity, right greater than left. Electronically Signed   By: Misty Stanley M.D.   On: 01/15/2020 05:38        Scheduled Meds: . folic acid  1 mg Oral Daily  . furosemide  20 mg Oral BID  . insulin aspart  0-15 Units Subcutaneous TID AC & HS  . lactulose  20 g Oral BID  . latanoprost  1 drop Both Eyes QHS  . levothyroxine  75 mcg Oral Q0600  . metoprolol succinate  12.5 mg Oral Daily  . pantoprazole  40 mg Oral Daily  .  spironolactone  12.5 mg Oral Daily   Continuous Infusions:   LOS: 4 days        Hosie Poisson, MD [facility]   To contact the attending provider between 7A-7P or the covering provider during after hours 7P-7A, please log into the web site [email or web address] and access using universal [place] password  for that web site. If you do not have the password, please call the hospital operator.  01/16/2020, 6:31 PM

## 2020-01-17 DIAGNOSIS — K7031 Alcoholic cirrhosis of liver with ascites: Secondary | ICD-10-CM | POA: Diagnosis not present

## 2020-01-17 DIAGNOSIS — K219 Gastro-esophageal reflux disease without esophagitis: Secondary | ICD-10-CM | POA: Diagnosis not present

## 2020-01-17 DIAGNOSIS — I5033 Acute on chronic diastolic (congestive) heart failure: Secondary | ICD-10-CM | POA: Diagnosis not present

## 2020-01-17 DIAGNOSIS — E039 Hypothyroidism, unspecified: Secondary | ICD-10-CM | POA: Diagnosis not present

## 2020-01-17 LAB — BODY FLUID CULTURE: Culture: NO GROWTH

## 2020-01-17 LAB — AMMONIA: Ammonia: 53 umol/L — ABNORMAL HIGH (ref 9–35)

## 2020-01-17 LAB — GLUCOSE, CAPILLARY
Glucose-Capillary: 157 mg/dL — ABNORMAL HIGH (ref 70–99)
Glucose-Capillary: 215 mg/dL — ABNORMAL HIGH (ref 70–99)

## 2020-01-17 MED ORDER — LACTULOSE 10 GM/15ML PO SOLN: 20.0000 g | Freq: Two times a day (BID) | ORAL | 2 refills | Status: DC | PRN

## 2020-01-18 ENCOUNTER — Telehealth: Payer: Self-pay

## 2020-01-18 ENCOUNTER — Other Ambulatory Visit: Payer: Self-pay | Admitting: *Deleted

## 2020-01-18 LAB — ERYTHROPOIETIN: Erythropoietin: 279.2 m[IU]/mL — ABNORMAL HIGH (ref 2.6–18.5)

## 2020-01-18 NOTE — Discharge Summary (Signed)
Physician Discharge Summary  Jessica Dennis LKG:[id number] DOB: September 14, 1932 DOA: 01/12/2020  PCP: Donato Schultz, DO  Admit date: 01/12/2020 Discharge date: 01/17/2020  Admitted From: Home.  Disposition:  HOME   Recommendations for Outpatient Follow-up:  1. Follow up with PCP in 1-2 weeks 2. Please obtain BMP/CBC in one week 3. Please follow up with Dr Leone Payor in one week.   Home Health:YES   Discharge Condition:stable.  CODE STATUS: DNR Diet recommendation: Heart Healthy   Brief/Interim Summary: 86 year old female with past medical history of gastroesophageal reflux disease, hypertension, hyperlipidemia, hypothyroidism, alcoholic cirrhosis, insulin-dependent diabetes mellitus type2,diabetic neuropathy, GAVE, diastolic congestiveheart failure(Echo 11/2019 EF 60-65%)who presented to med [facility] emergency department on 12/21 with shortness of breath. Patient was found to have a substantial right-sided pleural effusion thought to be contributing to the patient's shortness of breath. Patient was administered albumin due to concerns for transient hypotension. Due to perceived need for thoracentesis the hospitalist group was called and the patient was accepted to [facility] long hospital for further care. She underwent thoracentesis on 12/23 and 2 lit of fluid removed and fluid sent for analysis.  Pleural fluid cultures negative so far, probably a transudate from liver cirrhosis and she was discharged home .    Discharge Diagnoses:  Principal Problem:   Acute on chronic diastolic CHF (congestive heart failure) (HCC) Active Problems:   Hypothyroidism   Type 2 diabetes mellitus with hyperglycemia, with long-term current use of insulin (HCC)   GERD without esophagitis   Essential hypertension   Alcoholic cirrhosis of liver with ascites (HCC)   Portal hypertensive gastropathy (HCC)   Shortness of breath   Pleural effusion on right  Right large pleural effusion  differential include transudate from liver cirrhosis. Vs from para pneumonic effusion from CAP vs  From acute on chronic diastolic CHF.  S/p thoracentesis and 2l it of fluid taken out.  Fluid sent for analysis.  Fluid cultures negative so far probably transudate from liver cirrhosis.  She is weaned off oxygen at this time and is currently on room air No indication of antibiotics at this time.    Mild acute on chronic diastolic heart failure:  Currently on IV lasix 20 mg BID transitioned to oral lasix.  Diuresed about 4 lit of fluid in the last 24 hours. .  Continue the same dose of lasix.  Creatinine stable at 1.1.     Hypothyroidism Continue with Synthroid.   Type 2 diabetes mellitus. Resume home meds.    Liver cirrhosis, trace ascites ascites found Continue with Lasix and spironolactone.    GERD Continue  PPI.   Anemia of chronic disease Probably secondary to liver cirrhosis. Hemoglobin on admission at 6.3 s/p 1 unit of PRBC transfusion and hemoglobin has improved to 7.8. We will check for fecal occult blood as she is prone for varices in view of her history of liver cirrhosis. IV iron infusion ordered by oncology.  Pancytopenia probably secondary to liver cirrhosis.   Hypokalemia Replaced   1.1 cm hypodensity within the pancreatic head with no associated main pancreatic duct dilatation  MRI of the liver ordered for further evaluation Motion degraded study. 3 cystic lesions in the pancreas measuring up to 25 mm in maximum dimension. Lesions are incompletely characterized due to motion degradation on the current study. No overtly suspicious enhancement attributable to any of these lesions within limitations of the motion degradation. consider MRI in 6 months to ensure stability.   Acute metabolic encephalopathy secondary to hepatic  encephalopathy:  Ammonia level high, started her on lactulose. Repeat ammonia level has improved. She was discharged on  oral lactulose .  On discharge she was alert and oriented.     Discharge Instructions  Discharge Instructions    Diet - low sodium heart healthy   Complete by: As directed    Diet - low sodium heart healthy   Complete by: As directed    Increase activity slowly   Complete by: As directed    Increase activity slowly   Complete by: As directed      Allergies as of 01/17/2020      Reactions   Tizanidine    Hallucinate, confused       Medication List    TAKE these medications   Accu-Chek FastClix Lancets Misc CHECK FOR BLOOD SUGAR DAILY   alclomethasone 0.05 % cream Commonly known as: ACLOVATE Apply 1 application topically daily as needed (dermatitis).   ammonium lactate 12 % cream Commonly known as: AMLACTIN Apply topically daily as needed for dry skin.   CALCIUM CARBONATE-VITAMIN D PO Take 1 tablet by mouth daily. Calcium 1200mg   Vitamin D 5000 units  Pt also has a calcium 600mg  plus 800 units vitamin D supplement   folic acid 1 MG tablet Commonly known as: FOLVITE Take 1 tablet (1 mg total) by mouth daily.   furosemide 20 MG tablet Commonly known as: LASIX TAKE ONE TABLET BY MOUTH TWICE A DAY What changed: when to take this   lactulose 10 GM/15ML solution Commonly known as: CHRONULAC Take 30 mLs (20 g total) by mouth 2 (two) times daily as needed for mild constipation.   latanoprost 0.005 % ophthalmic solution Commonly known as: XALATAN Place 1 drop into both eyes at bedtime.   levothyroxine 75 MCG tablet Commonly known as: SYNTHROID Take 1 tablet (75 mcg total) by mouth daily.   LORazepam 0.5 MG tablet Commonly known as: ATIVAN Take 0.5 mg by mouth daily as needed for anxiety.   magnesium oxide 400 MG tablet Commonly known as: MAG-OX Take 1 tablet (400 mg total) by mouth 2 (two) times daily. For 7 days   metFORMIN 500 MG 24 hr tablet Commonly known as: GLUCOPHAGE-XR Take 1 tablet (500 mg total) by mouth daily.   metoprolol succinate 25 MG  24 hr tablet Commonly known as: TOPROL-XL TAKE 0.5 TABLETS BY MOUTH DAILY What changed: See the new instructions.   NOVOLOG MIX 70/30 [place] Inject 25-35 Units into the skin See admin instructions. 35 units in the mornings and 25 at night   Omega-3 1000 MG Caps Take 1,000 mg by mouth daily.   pantoprazole 40 MG tablet Commonly known as: PROTONIX Take 1 tablet (40 mg total) by mouth daily. What changed:   when to take this  reasons to take this   Prednicarbate 0.1 % Crea Apply 1 application topically daily as needed (dermatitis).   PREVAGEN PO Take 1 tablet by mouth daily.   simvastatin 20 MG tablet Commonly known as: ZOCOR TAKE ONE TABLET BY MOUTH EVERY NIGHT AT BEDTIME What changed: when to take this   spironolactone 25 MG tablet Commonly known as: ALDACTONE Take 0.5 tablets (12.5 mg total) by mouth daily.   SSD 1 % cream Generic drug: silver sulfADIAZINE Apply 1 application topically daily as needed (skin issues).   vitamin C 1000 MG tablet Take 1,000 mg by mouth daily.   vitamin E 180 MG (400 UNITS) capsule Take 400 Units by mouth daily.  Allergies  Allergen Reactions  . Tizanidine     Hallucinate, confused     Consultations: Dr Marin Olp. Procedures/Studies: DG Chest 1 View  Result Date: 01/14/2020 CLINICAL DATA:  Status post thoracentesis EXAM: CHEST  1 VIEW COMPARISON:  January 12, 2020 FINDINGS: No pneumothorax. Right pleural effusion significantly smaller following thoracentesis. There is residual pleural effusion on the right with small pleural effusion on the left. There is bibasilar atelectasis. Heart is upper normal in size with pulmonary vascularity normal. No adenopathy. No bone lesions. IMPRESSION: No pneumothorax. Right pleural effusion significantly smaller following thoracentesis. Fairly small pleural effusions present bilaterally with bibasilar atelectasis. Stable cardiac silhouette. Electronically Signed   By: Lowella Grip III M.D.    On: 01/14/2020 11:10   DG Chest 2 View  Result Date: 01/12/2020 CLINICAL DATA:  Shortness of breath for the past month. EXAM: CHEST - 2 VIEW COMPARISON:  Chest x-ray dated November 14, 2019. FINDINGS: The right heart is obscured. Normal pulmonary vascularity. Increasing now moderate to large right pleural effusion with likely right lower lobe collapse and right middle lobe atelectasis. New trace left pleural effusion. No pneumothorax. No acute osseous abnormality. IMPRESSION: 1. Increasing now moderate to large right pleural effusion. 2. New trace left pleural effusion. Electronically Signed   By: Titus Dubin M.D.   On: 01/12/2020 12:52   CT Angio Chest PE W and/or Wo Contrast  Result Date: 01/12/2020 CLINICAL DATA:  Abdominal distension, large right pleural effusion on chest x-ray. Suspected pulmonary embolus. EXAM: CT ANGIOGRAPHY CHEST CT ABDOMEN AND PELVIS WITH CONTRAST TECHNIQUE: Multidetector CT imaging of the chest was performed using the standard protocol during bolus administration of intravenous contrast. Multiplanar CT image reconstructions and MIPs were obtained to evaluate the vascular anatomy. Multidetector CT imaging of the abdomen and pelvis was performed using the standard protocol during bolus administration of intravenous contrast. CONTRAST:  15mL OMNIPAQUE IOHEXOL 350 MG/ML SOLN COMPARISON:  CT abdomen pelvis 04/28/2014 FINDINGS: CTA CHEST FINDINGS Cardiovascular: Satisfactory opacification of the pulmonary arteries to the segmental level. No evidence of pulmonary embolism. Normal heart size. No significant pericardial effusion. The thoracic aorta is normal in caliber. Mild-to-moderate atherosclerotic plaque of the thoracic aorta. Suggestion of mild mitral annular calcifications. Mild coronary artery calcifications. Mediastinum/Nodes: No enlarged mediastinal, hilar, or axillary lymph nodes. Thyroid gland, trachea, and esophagus demonstrate no significant findings. Lungs/Pleura:  Partial collapse of the right lower lobe. Passive atelectasis of the right upper lobe. Left base atelectasis. Right large right pleural effusion. No left pleural effusion. No pneumothorax. Musculoskeletal: No chest wall abnormality No suspicious lytic or blastic osseous lesions. No acute displaced fracture. Multilevel degenerative changes of the spine with severe intervertebral disc space narrowing and endplate sclerosis at the T3-T8 levels. Review of the MIP images confirms the above findings. CT ABDOMEN and PELVIS FINDINGS Hepatobiliary: Nodular hepatic contour. No focal hepatic lesion on this single contrast study. Calcification within the gallbladder lumen likely represents a gallstone. Otherwise no gallbladder wall thickening or pericholecystic fluid. No biliary dilatation. Pancreas: There is a 1.1 cm hypodensity within the pancreatic head. Normal pancreatic contour. No surrounding inflammatory changes. No main pancreatic ductal dilatation. Spleen: The spleen is enlarged in size.  No splenic lesion noted. Adrenals/Urinary Tract: No adrenal nodule bilaterally. Bilateral kidneys enhance symmetrically. Subcentimeter hypodensities are too small to characterize. No hydronephrosis. No hydroureter. The urinary bladder is unremarkable. Stomach/Bowel: Stomach is within normal limits. No evidence of bowel wall thickening or dilatation. Diffuse colonic diverticulosis. Status post appendectomy. Vascular/Lymphatic: [place] off a  GIA ill, perigastric, perisplenic venous collaterals. Recanalized paraumbilical vein. The hepatic, portal, splenic, superior mesenteric veins are patent. No abdominal aorta or iliac aneurysm. At least mild atherosclerotic plaque of the aorta and its branches. 5 mm calcification in the region of the left kidney likely vascular. No abdominal, pelvic, or inguinal lymphadenopathy. Reproductive: Not visualized due to streak artifact originating from bilateral femoral surgical hardware. Other: Trace to  small simple free fluid within the abdomen and pelvis. No intraperitoneal free gas. No organized fluid collection. Musculoskeletal: No abdominal wall hernia or abnormality. No suspicious lytic or blastic osseous lesions. No acute displaced fracture. Multilevel degenerative changes of the spine with L2-L3 and L4-L5 intervertebral disc space vacuum phenomenon and endplate sclerosis. Bilateral total hip arthroplasty. Review of the MIP images confirms the above findings. IMPRESSION: 1. No pulmonary embolus. 2. Large right pleural effusion with partial collapse of the right lower lobe. 3. Cirrhotic morphology with portal hypertension. Markedly limited evaluation of the hepatic parenchyma for focal masses on this single phase study. Recommend MRI liver protocol for further evaluation. 4. Trace to small volume simple free fluid ascites. 5. A 1.1 cm hypodensity within the pancreatic head with no associated main pancreatic duct dilatation distally. Finding of unclear etiology. Can be further evaluated on MRI liver protocol. 6. Diffuse colonic diverticulosis with no acute diverticulitis. 7. Cholelithiasis with no acute cholecystitis. Electronically Signed   By: Iven Finn M.D.   On: 01/12/2020 21:05   CT ABDOMEN PELVIS W CONTRAST  Result Date: 01/12/2020 CLINICAL DATA:  Abdominal distension, large right pleural effusion on chest x-ray. Suspected pulmonary embolus. EXAM: CT ANGIOGRAPHY CHEST CT ABDOMEN AND PELVIS WITH CONTRAST TECHNIQUE: Multidetector CT imaging of the chest was performed using the standard protocol during bolus administration of intravenous contrast. Multiplanar CT image reconstructions and MIPs were obtained to evaluate the vascular anatomy. Multidetector CT imaging of the abdomen and pelvis was performed using the standard protocol during bolus administration of intravenous contrast. CONTRAST:  86mL OMNIPAQUE IOHEXOL 350 MG/ML SOLN COMPARISON:  CT abdomen pelvis 04/28/2014 FINDINGS: CTA CHEST  FINDINGS Cardiovascular: Satisfactory opacification of the pulmonary arteries to the segmental level. No evidence of pulmonary embolism. Normal heart size. No significant pericardial effusion. The thoracic aorta is normal in caliber. Mild-to-moderate atherosclerotic plaque of the thoracic aorta. Suggestion of mild mitral annular calcifications. Mild coronary artery calcifications. Mediastinum/Nodes: No enlarged mediastinal, hilar, or axillary lymph nodes. Thyroid gland, trachea, and esophagus demonstrate no significant findings. Lungs/Pleura: Partial collapse of the right lower lobe. Passive atelectasis of the right upper lobe. Left base atelectasis. Right large right pleural effusion. No left pleural effusion. No pneumothorax. Musculoskeletal: No chest wall abnormality No suspicious lytic or blastic osseous lesions. No acute displaced fracture. Multilevel degenerative changes of the spine with severe intervertebral disc space narrowing and endplate sclerosis at the T3-T8 levels. Review of the MIP images confirms the above findings. CT ABDOMEN and PELVIS FINDINGS Hepatobiliary: Nodular hepatic contour. No focal hepatic lesion on this single contrast study. Calcification within the gallbladder lumen likely represents a gallstone. Otherwise no gallbladder wall thickening or pericholecystic fluid. No biliary dilatation. Pancreas: There is a 1.1 cm hypodensity within the pancreatic head. Normal pancreatic contour. No surrounding inflammatory changes. No main pancreatic ductal dilatation. Spleen: The spleen is enlarged in size.  No splenic lesion noted. Adrenals/Urinary Tract: No adrenal nodule bilaterally. Bilateral kidneys enhance symmetrically. Subcentimeter hypodensities are too small to characterize. No hydronephrosis. No hydroureter. The urinary bladder is unremarkable. Stomach/Bowel: Stomach is within normal limits. No evidence  of bowel wall thickening or dilatation. Diffuse colonic diverticulosis. Status post  appendectomy. Vascular/Lymphatic: [place] off a GIA ill, perigastric, perisplenic venous collaterals. Recanalized paraumbilical vein. The hepatic, portal, splenic, superior mesenteric veins are patent. No abdominal aorta or iliac aneurysm. At least mild atherosclerotic plaque of the aorta and its branches. 5 mm calcification in the region of the left kidney likely vascular. No abdominal, pelvic, or inguinal lymphadenopathy. Reproductive: Not visualized due to streak artifact originating from bilateral femoral surgical hardware. Other: Trace to small simple free fluid within the abdomen and pelvis. No intraperitoneal free gas. No organized fluid collection. Musculoskeletal: No abdominal wall hernia or abnormality. No suspicious lytic or blastic osseous lesions. No acute displaced fracture. Multilevel degenerative changes of the spine with L2-L3 and L4-L5 intervertebral disc space vacuum phenomenon and endplate sclerosis. Bilateral total hip arthroplasty. Review of the MIP images confirms the above findings. IMPRESSION: 1. No pulmonary embolus. 2. Large right pleural effusion with partial collapse of the right lower lobe. 3. Cirrhotic morphology with portal hypertension. Markedly limited evaluation of the hepatic parenchyma for focal masses on this single phase study. Recommend MRI liver protocol for further evaluation. 4. Trace to small volume simple free fluid ascites. 5. A 1.1 cm hypodensity within the pancreatic head with no associated main pancreatic duct dilatation distally. Finding of unclear etiology. Can be further evaluated on MRI liver protocol. 6. Diffuse colonic diverticulosis with no acute diverticulitis. 7. Cholelithiasis with no acute cholecystitis. Electronically Signed   By: Iven Finn M.D.   On: 01/12/2020 21:05   MR LIVER W WO CONTRAST  Result Date: 01/15/2020 CLINICAL DATA:  11 mm pancreatic lesion on CT. EXAM: MRI ABDOMEN WITHOUT AND WITH CONTRAST TECHNIQUE: Multiplanar multisequence MR  imaging of the abdomen was performed both before and after the administration of intravenous contrast. CONTRAST:  71mL GADAVIST GADOBUTROL 1 MMOL/ML IV SOLN COMPARISON:  CT 01/12/2020. FINDINGS: Lower chest: Bilateral pleural effusions, right greater than left with bibasilar collapse/consolidative opacity, right greater than left. Hepatobiliary: Postcontrast imaging of the liver is motion degraded. Within this limitation, no suspicious arterial phase hyperenhancement is discernible. Gallbladder unremarkable. No intrahepatic or extrahepatic biliary dilation. Pancreas: 12 mm well-defined cystic lesion identified in the head of pancreas. Although postcontrast imaging is motion degraded, no suspicious enhancement can be identified in the lesion. A second 2.5 x 1.8 cm multicystic lesion is identified in the tail of pancreas on axial T2 haste image 19 of series 28. A third 10 mm cystic lesion is identified in the tail of pancreas on 18/28. No substantial dilatation of the main pancreatic duct. Spleen:  Splenomegaly at 16.1 cm craniocaudal length. Adrenals/Urinary Tract: No adrenal nodule or mass. No suspicious renal mass lesion although postcontrast imaging is motion degraded. No hydronephrosis. Stomach/Bowel: Stomach is nondistended. Duodenum is normally positioned as is the ligament of Treitz. No small bowel or colonic dilatation within the visualized abdomen. Vascular/Lymphatic: No abdominal aortic aneurysm. Portal vein, superior mesenteric vein, and splenic vein are patent. Recanalization of the paraumbilical vein evident with paraesophageal varices noted. Other:  No substantial intraperitoneal free fluid. Musculoskeletal: No focal suspicious marrow enhancement within the visualized bony anatomy. IMPRESSION: 1. Motion degraded study. 3 cystic lesions in the pancreas measuring up to 25 mm in maximum dimension. Lesions are incompletely characterized due to motion degradation on the current study. No overtly suspicious  enhancement attributable to any of these lesions within limitations of the motion degradation. S today's exam is limited, consider MRI in 6 months to ensure stability. 2.  Cirrhotic liver morphology without suspicious arterial phase hyperenhancement on limited postcontrast imaging. Splenomegaly, recanalization of the paraumbilical vein, and paraesophageal varices are consistent with portal venous hypertension. 3. Bilateral pleural effusions, right greater than left with bibasilar collapse/consolidative opacity, right greater than left. Electronically Signed   By: Misty Stanley M.D.   On: 01/15/2020 05:38   US THORACENTESIS ASP PLEURAL SPACE W/IMG GUIDE  Result Date: 01/14/2020 INDICATION: Patient with history of GERD, hypertension, alcoholic cirrhosis, diabetes, congestive heart failure, dyspnea, right pleural effusion. Request received for diagnostic and therapeutic right thoracentesis. EXAM: ULTRASOUND GUIDED DIAGNOSTIC AND THERAPEUTIC RIGHT THORACENTESIS MEDICATIONS: 1% lidocaine to skin and subcutaneous tissue COMPLICATIONS: None immediate. PROCEDURE: An ultrasound guided thoracentesis was thoroughly discussed with the patient and questions answered. The benefits, risks, alternatives and complications were also discussed. The patient understands and wishes to proceed with the procedure. Written consent was obtained. Ultrasound was performed to localize and mark an adequate pocket of fluid in the right chest. The area was then prepped and draped in the normal sterile fashion. 1% Lidocaine was used for local anesthesia. Under ultrasound guidance a 6 Fr Safe-T-Centesis catheter was introduced. Thoracentesis was performed. The catheter was removed and a dressing applied. FINDINGS: A total of approximately 2 liters of slightly hazy, yellow fluid was removed. Samples were sent to the laboratory as requested by the clinical team. IMPRESSION: Successful ultrasound guided diagnostic and therapeutic right  thoracentesis yielding 2 liters of pleural fluid. Read by: Rowe Robert, PA-C Electronically Signed   By: Jerilynn Mages.  Shick M.D.   On: 01/14/2020 11:04       Subjective: Wants to go home, denies any new complaints.   Discharge Exam: Vitals:   01/17/20 0528 01/17/20 1147  BP: (!) 112/55 104/65  Pulse: 65 95  Resp: 18 20  Temp: 98.2 F (36.8 C) 98 F (36.7 C)  SpO2: 94% 92%   Vitals:   01/16/20 1138 01/16/20 2028 01/17/20 0528 01/17/20 1147  BP: (!) 104/51 109/60 (!) 112/55 104/65  Pulse: 61 62 65 95  Resp: 17 18 18 20   Temp: 98.5 F (36.9 C) 98.3 F (36.8 C) 98.2 F (36.8 C) 98 F (36.7 C)  TempSrc:  Oral Oral Oral  SpO2: 91% 97% 94% 92%  Weight:      Height:        General: Pt is alert, awake, not in acute distress Cardiovascular: RRR, S1/S2 +, no rubs, no gallops Respiratory: CTA bilaterally, no wheezing, no rhonchi Abdominal: Soft, NT, ND, bowel sounds + Extremities: no edema, no cyanosis    The results of significant diagnostics from this hospitalization (including imaging, microbiology, ancillary and laboratory) are listed below for reference.     Microbiology: Recent Results (from the past 240 hour(s))  Resp Panel by RT-PCR (Flu A&B, Covid) Nasopharyngeal Swab     Status: None   Collection Time: 01/12/20  6:30 PM   Specimen: Nasopharyngeal Swab; Nasopharyngeal(NP) swabs in vial transport medium  Result Value Ref Range Status   SARS Coronavirus 2 by RT PCR NEGATIVE NEGATIVE Final    Comment: (NOTE) SARS-CoV-2 target nucleic acids are NOT DETECTED.  The SARS-CoV-2 RNA is generally detectable in upper respiratory specimens during the acute phase of infection. The lowest concentration of SARS-CoV-2 viral copies this assay can detect is 138 copies/mL. A negative result does not preclude SARS-Cov-2 infection and should not be used as the sole basis for treatment or other patient management decisions. A negative result may occur with  improper specimen  collection/handling, submission  of specimen other than nasopharyngeal swab, presence of viral mutation(s) within the areas targeted by this assay, and inadequate number of viral copies(<138 copies/mL). A negative result must be combined with clinical observations, patient history, and epidemiological information. The expected result is Negative.  Fact Sheet for Patients:  EntrepreneurPulse.com.au  Fact Sheet for Healthcare Providers:  IncredibleEmployment.be  This test is no t yet approved or cleared by the [place] FDA and  has been authorized for detection and/or diagnosis of SARS-CoV-2 by FDA under an Emergency Use Authorization (EUA). This EUA will remain  in effect (meaning this test can be used) for the duration of the COVID-19 declaration under Section 564(b)(1) of the Act, 21 U.S.C.section 360bbb-3(b)(1), unless the authorization is terminated  or revoked sooner.       Influenza A by PCR NEGATIVE NEGATIVE Final   Influenza B by PCR NEGATIVE NEGATIVE Final    Comment: (NOTE) The Xpert Xpress SARS-CoV-2/FLU/RSV plus assay is intended as an aid in the diagnosis of influenza from Nasopharyngeal swab specimens and should not be used as a sole basis for treatment. Nasal washings and aspirates are unacceptable for Xpert Xpress SARS-CoV-2/FLU/RSV testing.  Fact Sheet for Patients: EntrepreneurPulse.com.au  Fact Sheet for Healthcare Providers: IncredibleEmployment.be  This test is not yet approved or cleared by the [place] FDA and has been authorized for detection and/or diagnosis of SARS-CoV-2 by FDA under an Emergency Use Authorization (EUA). This EUA will remain in effect (meaning this test can be used) for the duration of the COVID-19 declaration under Section 564(b)(1) of the Act, 21 U.S.C. section 360bbb-3(b)(1), unless the authorization is terminated or revoked.  Performed at [facility], [address] [place], [place] [address]   Body fluid culture     Status: None   Collection Time: 01/14/20 11:00 AM   Specimen: PATH Cytology Pleural fluid  Result Value Ref Range Status   Specimen Description   Final    PLEURAL Performed at [facility], [place] [address] [place], [place] [address]    Special Requests   Final    NONE Performed at [facility], [place] [address] [place], [place] [address]    Gram Stain   Final    RARE WBC PRESENT, PREDOMINANTLY MONONUCLEAR NO ORGANISMS SEEN    Culture   Final    NO GROWTH Performed at [facility], [place] [address] [place], [place] [address]    Report Status 01/17/2020 FINAL  Final     Labs: BNP (last 3 results) Recent Labs    11/14/19 1136 01/13/20 0610  BNP 112.0* [id number]*   Basic Metabolic Panel: Recent Labs  Lab 01/12/20 1241 01/12/20 1815 01/13/20 0610 01/14/20 0618 01/15/20 0554  NA 138 140 141 141 141  K 3.9 3.3* 3.4* 3.2* 3.5  CL 102 104 105 103 104  CO2 28 25 23 24 26   GLUCOSE 325* 105* 181* 153* 179*  BUN 23 23 22 19 19   CREATININE 1.39* 1.30* 1.11* 1.11* 1.14*  CALCIUM 8.7 9.2 9.2 8.8* 8.5*  MG  --   --  2.2  --   --    Liver Function Tests: Recent Labs  Lab 01/12/20 1241 01/13/20 0610 01/14/20 0614  AST 34 33 36  ALT 17 17 19   ALKPHOS 97 72 70  BILITOT 1.3* 1.7* 2.4*  PROT 5.5* 5.6* 5.5*  ALBUMIN 3.2* 3.7 3.3*   No results for input(s): LIPASE, AMYLASE in the last 168 hours. Recent  Labs  Lab 01/13/20 0610 01/16/20 1213 01/17/20 1048  AMMONIA 37* 71* 53*   CBC: Recent Labs  Lab 01/12/20 1241 01/12/20 1815 01/13/20 0610 01/13/20 0847 01/14/20 0618 01/15/20 0554 01/16/20 0538  WBC 4.7 5.1 2.5*  --  3.5* 3.0* 2.8*  NEUTROABS 3.6 3.5 1.6*  --   --   --  1.7  HGB 7.6 Repeated and verified X2.* 8.1* 6.3* 6.3* 7.8* 7.8* 8.1*  HCT 24.1* 27.5* 21.5* 22.0* 25.4* 26.0* 26.5*  MCV 75.9* 80.4 81.7  --  81.9 82.5 82.6  PLT  60.0* 62* 42*  --  46* 41* 40*   Cardiac Enzymes: No results for input(s): CKTOTAL, CKMB, CKMBINDEX, TROPONINI in the last 168 hours. BNP: Invalid input(s): POCBNP CBG: Recent Labs  Lab 01/16/20 1134 01/16/20 1706 01/16/20 2028 01/17/20 0726 01/17/20 1145  GLUCAP 241* 239* 230* 157* 215*   D-Dimer No results for input(s): DDIMER in the last 72 hours. Hgb A1c No results for input(s): HGBA1C in the last 72 hours. Lipid Profile No results for input(s): CHOL, HDL, LDLCALC, TRIG, CHOLHDL, LDLDIRECT in the last 72 hours. Thyroid function studies No results for input(s): TSH, T4TOTAL, T3FREE, THYROIDAB in the last 72 hours.  Invalid input(s): FREET3 Anemia work up Recent Labs    01/16/20 0538  RETICCTPCT 2.6   Urinalysis    Component Value Date/Time   COLORURINE YELLOW 10/16/2019 [place] 10/16/2019 2215   LABSPEC 1.015 10/16/2019 2215   PHURINE 5.0 10/16/2019 2215   GLUCOSEU NEGATIVE 10/16/2019 2215   GLUCOSEU NEGATIVE 06/02/2015 [place] 10/16/2019 2215   [place] 10/16/2019 2215   BILIRUBINUR neg 10/26/2016 1255   KETONESUR 5 (A) 10/16/2019 2215   PROTEINUR NEGATIVE 10/16/2019 2215   UROBILINOGEN 0.2 10/26/2016 1255   UROBILINOGEN 0.2 06/02/2015 1050   NITRITE POSITIVE (A) 10/16/2019 2215   LEUKOCYTESUR MODERATE (A) 10/16/2019 2215   Sepsis Labs Invalid input(s): PROCALCITONIN,  WBC,  LACTICIDVEN Microbiology Recent Results (from the past 240 hour(s))  Resp Panel by RT-PCR (Flu A&B, Covid) Nasopharyngeal Swab     Status: None   Collection Time: 01/12/20  6:30 PM   Specimen: Nasopharyngeal Swab; Nasopharyngeal(NP) swabs in vial transport medium  Result Value Ref Range Status   SARS Coronavirus 2 by RT PCR NEGATIVE NEGATIVE Final    Comment: (NOTE) SARS-CoV-2 target nucleic acids are NOT DETECTED.  The SARS-CoV-2 RNA is generally detectable in upper respiratory specimens during the acute phase of infection. The  lowest concentration of SARS-CoV-2 viral copies this assay can detect is 138 copies/mL. A negative result does not preclude SARS-Cov-2 infection and should not be used as the sole basis for treatment or other patient management decisions. A negative result may occur with  improper specimen collection/handling, submission of specimen other than nasopharyngeal swab, presence of viral mutation(s) within the areas targeted by this assay, and inadequate number of viral copies(<138 copies/mL). A negative result must be combined with clinical observations, patient history, and epidemiological information. The expected result is Negative.  Fact Sheet for Patients:  EntrepreneurPulse.com.au  Fact Sheet for Healthcare Providers:  IncredibleEmployment.be  This test is no t yet approved or cleared by the [place] FDA and  has been authorized for detection and/or diagnosis of SARS-CoV-2 by FDA under an Emergency Use Authorization (EUA). This EUA will remain  in effect (meaning this test can be used) for the duration of the COVID-19 declaration under Section 564(b)(1) of the Act, 21 U.S.C.section 360bbb-3(b)(1), unless the authorization  is terminated  or revoked sooner.       Influenza A by PCR NEGATIVE NEGATIVE Final   Influenza B by PCR NEGATIVE NEGATIVE Final    Comment: (NOTE) The Xpert Xpress SARS-CoV-2/FLU/RSV plus assay is intended as an aid in the diagnosis of influenza from Nasopharyngeal swab specimens and should not be used as a sole basis for treatment. Nasal washings and aspirates are unacceptable for Xpert Xpress SARS-CoV-2/FLU/RSV testing.  Fact Sheet for Patients: EntrepreneurPulse.com.au  Fact Sheet for Healthcare Providers: IncredibleEmployment.be  This test is not yet approved or cleared by the [place] FDA and has been authorized for detection and/or diagnosis of SARS-CoV-2 by FDA under  an Emergency Use Authorization (EUA). This EUA will remain in effect (meaning this test can be used) for the duration of the COVID-19 declaration under Section 564(b)(1) of the Act, 21 U.S.C. section 360bbb-3(b)(1), unless the authorization is terminated or revoked.  Performed at [facility], [place] [address] [place], [place] [address]   Body fluid culture     Status: None   Collection Time: 01/14/20 11:00 AM   Specimen: PATH Cytology Pleural fluid  Result Value Ref Range Status   Specimen Description   Final    PLEURAL Performed at [facility], [place] [address], [place], [place] [address]    Special Requests   Final    NONE Performed at [facility], [place] [address], [place], [place] [address]    Gram Stain   Final    RARE WBC PRESENT, PREDOMINANTLY MONONUCLEAR NO ORGANISMS SEEN    Culture   Final    NO GROWTH Performed at [facility], [place] [address], [place], [place] [address]    Report Status 01/17/2020 FINAL  Final     Time coordinating discharge: 32 minutes.   SIGNED:   Hosie Poisson, MD  [facility]

## 2020-01-18 NOTE — Patient Outreach (Signed)
  [facility]) Care Management Chronic Special Needs Program  01/18/2020  Name: Jessica Dennis DOB: Jan 16, 1933  MRN: [id number]  Jessica Dennis is enrolled in a chronic special needs plan for Diabetes.  Reviewed and updated care plan.  Subjective: Client admitted on 01/12/20 with pleural effusion.  Discharged home on 01/16/20 with home health services.  Reviewed and updated individualized care plan.  Transition of care to be completed by [facility] general discharge.    Goals Addressed            This Visit's Progress   . General - Client will not be readmitted within 30 days (C-SNP)       Please follow discharge instructions and call health care provider if you have any questions Attend all follow up appointments as scheduled Take medications as prescribed Use 24 hour nurse advice line at [phone]             Plan:  Individualized care plan updated and sent to client and primary care provider.  RN care manager will continue to follow and collaborate/ care coordinate as needed.  Chronic care management coordinator will outreach in:  Client will receive EMMI general discharge calls for transition of care.  [employer] to follow client as of 01/23/2020.   Audrie Gallus  [facility] [place] Case Manager, C-SNP  [phone]  .

## 2020-01-18 NOTE — Telephone Encounter (Signed)
Transition Care Management Follow-up Telephone Call  Date of discharge and from where: 01/17/20-  How have you been since you were released from the hospital? Fine per daughter in law-[name]  Any questions or concerns? No  Items Reviewed:  Did the pt receive and understand the discharge instructions provided? Yes   Medications obtained and verified? Yes   Other? Yes   Any new allergies since your discharge? No   Dietary orders reviewed? Yes  Do you have support at home? Yes   Home Care and Equipment/Supplies: Were home health services ordered? no If so, what is the name of the agency? n/a  Has the agency set up a time to come to the patient's home? not applicable Were any new equipment or medical supplies ordered?  No What is the name of the medical supply agency? n/a Were you able to get the supplies/equipment? not applicable Do you have any questions related to the use of the equipment or supplies?n/a  Functional Questionnaire: (I = Independent and D = Dependent) ADLs: I  Bathing/Dressing- I  Meal Prep- D  Eating- I  Maintaining continence- I  Transferring/Ambulation- I  Managing Meds- I  Follow up appointments reviewed:   PCP Hospital f/u appt confirmed? Yes  Scheduled to see Dr. Laury Axon on 01/25/20 @ 2:00.  Specialist Hospital f/u appt confirmed? No  Family to call & schedule with Dr. Clydene Pugh  Are transportation arrangements needed? No   If their condition worsens, is the pt aware to call PCP or go to the Emergency Dept.? Yes  Was the patient provided with contact information for the PCP's office or ED? Yes  Was to pt encouraged to call back with questions or concerns? Yes

## 2020-01-19 LAB — CYTOLOGY - NON PAP

## 2020-01-20 ENCOUNTER — Telehealth: Payer: Self-pay | Admitting: Family Medicine

## 2020-01-20 NOTE — Telephone Encounter (Signed)
Spoke w/ Maggie- verbal orders given.  

## 2020-01-20 NOTE — Telephone Encounter (Signed)
Caller : Seward Grater -[facility]  Call Back # [phone]  Seward Grater is requesting a verbal order to start nursing and physical therapy  on 01/23/2020. Patient referred to [facility] after a hospital stay for PE.   Please Advise

## 2020-01-21 ENCOUNTER — Telehealth: Payer: Self-pay

## 2020-01-21 ENCOUNTER — Other Ambulatory Visit: Payer: Self-pay | Admitting: Family Medicine

## 2020-01-21 ENCOUNTER — Inpatient Hospital Stay: Payer: HMO | Attending: Hematology & Oncology

## 2020-01-21 ENCOUNTER — Encounter: Payer: Self-pay | Admitting: Hematology & Oncology

## 2020-01-21 ENCOUNTER — Inpatient Hospital Stay (HOSPITAL_BASED_OUTPATIENT_CLINIC_OR_DEPARTMENT_OTHER): Payer: HMO | Admitting: Hematology & Oncology

## 2020-01-21 ENCOUNTER — Other Ambulatory Visit: Payer: Self-pay

## 2020-01-21 VITALS — BP 136/58 | HR 68 | Temp 98.4°F | Resp 18 | Ht 67.0 in | Wt 179.8 lb

## 2020-01-21 DIAGNOSIS — R600 Localized edema: Secondary | ICD-10-CM | POA: Insufficient documentation

## 2020-01-21 DIAGNOSIS — J90 Pleural effusion, not elsewhere classified: Secondary | ICD-10-CM | POA: Diagnosis not present

## 2020-01-21 DIAGNOSIS — Z79899 Other long term (current) drug therapy: Secondary | ICD-10-CM | POA: Insufficient documentation

## 2020-01-21 DIAGNOSIS — R5383 Other fatigue: Secondary | ICD-10-CM | POA: Insufficient documentation

## 2020-01-21 DIAGNOSIS — D6959 Other secondary thrombocytopenia: Secondary | ICD-10-CM | POA: Diagnosis present

## 2020-01-21 DIAGNOSIS — K922 Gastrointestinal hemorrhage, unspecified: Secondary | ICD-10-CM | POA: Diagnosis not present

## 2020-01-21 DIAGNOSIS — D500 Iron deficiency anemia secondary to blood loss (chronic): Secondary | ICD-10-CM | POA: Diagnosis not present

## 2020-01-21 DIAGNOSIS — K7460 Unspecified cirrhosis of liver: Secondary | ICD-10-CM | POA: Diagnosis not present

## 2020-01-21 DIAGNOSIS — R5381 Other malaise: Secondary | ICD-10-CM | POA: Diagnosis not present

## 2020-01-21 DIAGNOSIS — R161 Splenomegaly, not elsewhere classified: Secondary | ICD-10-CM | POA: Diagnosis not present

## 2020-01-21 DIAGNOSIS — D649 Anemia, unspecified: Secondary | ICD-10-CM

## 2020-01-21 DIAGNOSIS — R296 Repeated falls: Secondary | ICD-10-CM

## 2020-01-21 DIAGNOSIS — R195 Other fecal abnormalities: Secondary | ICD-10-CM

## 2020-01-21 LAB — CMP (CANCER CENTER ONLY)
ALT: 16 U/L (ref 0–44)
AST: 32 U/L (ref 15–41)
Albumin: 3.5 g/dL (ref 3.5–5.0)
Alkaline Phosphatase: 89 U/L (ref 38–126)
Anion gap: 7 (ref 5–15)
BUN: 15 mg/dL (ref 8–23)
CO2: 29 mmol/L (ref 22–32)
Calcium: 9.6 mg/dL (ref 8.9–10.3)
Chloride: 104 mmol/L (ref 98–111)
Creatinine: 1.11 mg/dL — ABNORMAL HIGH (ref 0.44–1.00)
GFR, Estimated: 49 mL/min — ABNORMAL LOW (ref >60)
Glucose, Bld: 280 mg/dL — ABNORMAL HIGH (ref 70–99)
Potassium: 3.7 mmol/L (ref 3.5–5.1)
Sodium: 140 mmol/L (ref 135–145)
Total Bilirubin: 1.8 mg/dL — ABNORMAL HIGH (ref 0.3–1.2)
Total Protein: 5.7 g/dL — ABNORMAL LOW (ref 6.5–8.1)

## 2020-01-21 LAB — CBC WITH DIFFERENTIAL (CANCER CENTER ONLY)
Abs Immature Granulocytes: 0.09 10*3/uL — ABNORMAL HIGH (ref 0.00–0.07)
Basophils Absolute: 0 10*3/uL (ref 0.0–0.1)
Basophils Relative: 0 %
Eosinophils Absolute: 0.2 10*3/uL (ref 0.0–0.5)
Eosinophils Relative: 3 %
HCT: 30.6 % — ABNORMAL LOW (ref 36.0–46.0)
Hemoglobin: 9.2 g/dL — ABNORMAL LOW (ref 12.0–15.0)
Immature Granulocytes: 2 %
Lymphocytes Relative: 14 %
Lymphs Abs: 0.7 10*3/uL (ref 0.7–4.0)
MCH: 25.7 pg — ABNORMAL LOW (ref 26.0–34.0)
MCHC: 30.1 g/dL (ref 30.0–36.0)
MCV: 85.5 fL (ref 80.0–100.0)
Monocytes Absolute: 0.4 10*3/uL (ref 0.1–1.0)
Monocytes Relative: 7 %
Neutro Abs: 3.4 10*3/uL (ref 1.7–7.7)
Neutrophils Relative %: 74 %
Platelet Count: 53 10*3/uL — ABNORMAL LOW (ref 150–400)
RBC: 3.58 MIL/uL — ABNORMAL LOW (ref 3.87–5.11)
RDW: 23.4 % — ABNORMAL HIGH (ref 11.5–15.5)
WBC Count: 4.7 10*3/uL (ref 4.0–10.5)
nRBC: 0 % (ref 0.0–0.2)

## 2020-01-21 LAB — RETICULOCYTES
Immature Retic Fract: 26.2 % — ABNORMAL HIGH (ref 2.3–15.9)
RBC.: 3.59 MIL/uL — ABNORMAL LOW (ref 3.87–5.11)
Retic Count, Absolute: 161.6 10*3/uL (ref 19.0–186.0)
Retic Ct Pct: 4.5 % — ABNORMAL HIGH (ref 0.4–3.1)

## 2020-01-21 LAB — SAMPLE TO BLOOD BANK

## 2020-01-21 NOTE — Telephone Encounter (Signed)
appts made per 01/21/20 los, req not to be printed////aom

## 2020-01-21 NOTE — Progress Notes (Signed)
Hematology and Oncology Follow Up Visit  Jessica Dennis [id number] Jan 30, 1932 86 y.o. 01/21/2020   Principle Diagnosis:  Chronic thrombocytopenia secondary to cirrhosis and splenomegaly Iron deficiency anemia secondary to GI bleed  Current Therapy:   IV iron as indicated Red blood cell transfusion as needed for variceal bleeding   Interim History:  [name] is here today for a long awaited follow-up.  She was seen in the hospital about a week ago.  She came in with a large right pleural effusion.  This was drained.  I think 2 L of fluid came out.  Thankfully, the fluid did not contain any malignant cells.  I suspect that the fluid probably is related to her having the cirrhosis.  However, she did have marked anemia.  She was not having any obvious bleeding.  Her hemoglobin was 6.3 when she was admitted.  MCV was 82.  Reticulocyte count, when corrected, was about 1%.  She had iron deficiency.  Her ferritin was 11 with an iron saturation of only 6%.  She received a dose of IV iron in the hospital.  Surprisingly, her erythropoietin level was actually on the higher side at 270.  She had scans done.  There is no obvious primary pulmonary issue.  She had no abdominal pathology.  She has cirrhosis.  She had splenomegaly but the spleen size but not measured.  She has had no problems with melena or bright red blood per rectum.  She does have some leg swelling.  There has been no fever.  She has had no issues with Covid.  Overall, I would have to say that her performance status is ECOG 1.  She comes in today with her daughter-in-law.  She actually does look better than when I saw her in the hospital.    Medications:  Allergies as of 01/21/2020      Reactions   Tizanidine Other (See Comments)   Hallucinate, confused       Medication List       Accurate as of January 21, 2020  2:49 PM. If you have any questions, ask your nurse or doctor.        STOP taking these  medications   alclomethasone 0.05 % cream Commonly known as: ACLOVATE Stopped by: Volanda Napoleon, MD   ammonium lactate 12 % cream Commonly known as: AMLACTIN Stopped by: Volanda Napoleon, MD   magnesium oxide 400 MG tablet Commonly known as: MAG-OX Stopped by: Volanda Napoleon, MD   Omega-3 1000 MG Caps Stopped by: Volanda Napoleon, MD   Prednicarbate 0.1 % Crea Stopped by: Volanda Napoleon, MD   PREVAGEN PO Stopped by: Volanda Napoleon, MD     TAKE these medications   Accu-Chek FastClix Lancets Misc CHECK FOR BLOOD SUGAR DAILY   CALCIUM CARBONATE-VITAMIN D PO Take 1 tablet by mouth daily. Calcium 1200mg   Vitamin D 5000 units  Pt also has a calcium 600mg  plus 800 units vitamin D supplement   folic acid 1 MG tablet Commonly known as: FOLVITE Take 1 tablet (1 mg total) by mouth daily.   furosemide 20 MG tablet Commonly known as: LASIX TAKE ONE TABLET BY MOUTH TWICE A DAY What changed: when to take this   lactulose 10 GM/15ML solution Commonly known as: CHRONULAC Take 30 mLs (20 g total) by mouth 2 (two) times daily as needed for mild constipation.   latanoprost 0.005 % ophthalmic solution Commonly known as: XALATAN Place 1 drop into both eyes at bedtime.  levothyroxine 75 MCG tablet Commonly known as: SYNTHROID Take 1 tablet (75 mcg total) by mouth daily.   LORazepam 0.5 MG tablet Commonly known as: ATIVAN Take 0.5 mg by mouth daily as needed for anxiety.   metFORMIN 500 MG 24 hr tablet Commonly known as: GLUCOPHAGE-XR Take 1 tablet (500 mg total) by mouth daily.   metoprolol succinate 25 MG 24 hr tablet Commonly known as: TOPROL-XL TAKE 0.5 TABLETS BY MOUTH DAILY What changed: See the new instructions.   NOVOLOG MIX 70/30 Trinity Inject 25-35 Units into the skin See admin instructions. 35 units in the mornings and 25 at night   pantoprazole 40 MG tablet Commonly known as: PROTONIX Take 1 tablet (40 mg total) by mouth daily. What changed:   when to  take this  reasons to take this   simvastatin 20 MG tablet Commonly known as: ZOCOR TAKE ONE TABLET BY MOUTH EVERY NIGHT AT BEDTIME What changed: when to take this   spironolactone 25 MG tablet Commonly known as: ALDACTONE Take 0.5 tablets (12.5 mg total) by mouth daily.   SSD 1 % cream Generic drug: silver sulfADIAZINE Apply 1 application topically daily as needed (skin issues).   vitamin C 1000 MG tablet Take 1,000 mg by mouth daily.   vitamin E 180 MG (400 UNITS) capsule Take 400 Units by mouth daily.       Allergies:  Allergies  Allergen Reactions  . Tizanidine Other (See Comments)    Hallucinate, confused     Past Medical History, Surgical history, Social history, and Family History were reviewed and updated.  Review of Systems: Review of Systems  Constitutional: Positive for malaise/fatigue.  HENT: Negative.   Eyes: Negative.   Respiratory: Positive for shortness of breath.   Cardiovascular: Positive for chest pain, palpitations, orthopnea and leg swelling.  Gastrointestinal: Negative.   Genitourinary: Negative.   Musculoskeletal: Negative.   Skin: Negative.   Neurological: Negative.   Endo/Heme/Allergies: Negative.   Psychiatric/Behavioral: Negative.      Physical Exam:  height is 5\' 7"  (1.702 m) and weight is 179 lb 12.8 oz (81.6 kg). Her oral temperature is 98.4 F (36.9 C). Her blood pressure is 136/58 (abnormal) and her pulse is 68. Her respiration is 18 and oxygen saturation is 98%.   Wt Readings from Last 3 Encounters:  01/21/20 179 lb 12.8 oz (81.6 kg)  01/12/20 180 lb (81.6 kg)  12/15/19 183 lb (83 kg)    Physical Exam Vitals reviewed.  HENT:     Head: Normocephalic and atraumatic.     Mouth/Throat:     Mouth: Oropharynx is clear and moist.  Eyes:     Extraocular Movements: EOM normal.     Pupils: Pupils are equal, round, and reactive to light.  Cardiovascular:     Rate and Rhythm: Normal rate and regular rhythm.     Heart  sounds: Normal heart sounds.  Pulmonary:     Effort: Pulmonary effort is normal.     Breath sounds: Normal breath sounds.  Abdominal:     General: Bowel sounds are normal.     Palpations: Abdomen is soft.  Musculoskeletal:        General: No tenderness, deformity or edema. Normal range of motion.     Cervical back: Normal range of motion.  Lymphadenopathy:     Cervical: No cervical adenopathy.  Skin:    General: Skin is warm and dry.     Findings: No erythema or rash.  Neurological:  Mental Status: She is alert and oriented to person, place, and time.  Psychiatric:        Mood and Affect: Mood and affect normal.        Behavior: Behavior normal.        Thought Content: Thought content normal.        Judgment: Judgment normal.      Lab Results  Component Value Date   WBC 4.7 01/21/2020   HGB 9.2 (L) 01/21/2020   HCT 30.6 (L) 01/21/2020   MCV 85.5 01/21/2020   PLT 53 (L) 01/21/2020   Lab Results  Component Value Date   FERRITIN 11 01/12/2020   IRON 19 (L) 01/12/2020   TIBC 336 01/12/2020   UIBC 317 01/12/2020   IRONPCTSAT 6 (L) 01/12/2020   Lab Results  Component Value Date   RETICCTPCT 4.5 (H) 01/21/2020   RBC 3.58 (L) 01/21/2020   RBC 3.59 (L) 01/21/2020   No results found for: KPAFRELGTCHN, LAMBDASER, KAPLAMBRATIO No results found for: IGGSERUM, IGA, IGMSERUM No results found for: Marda Stalker, SPEI   Chemistry      Component Value Date/Time   NA 140 01/21/2020 1137   NA 137 12/14/2019 1113   NA 138 12/18/2016 1314   NA 139 03/05/2016 1139   K 3.7 01/21/2020 1137   K 4.3 12/18/2016 1314   K 3.6 03/05/2016 1139   CL 104 01/21/2020 1137   CL 101 12/18/2016 1314   CO2 29 01/21/2020 1137   CO2 28 12/18/2016 1314   CO2 26 03/05/2016 1139   BUN 15 01/21/2020 1137   BUN 13 12/14/2019 1113   BUN 33 (H) 12/18/2016 1314   BUN 14.3 03/05/2016 1139   CREATININE 1.11 (H) 01/21/2020 1137   CREATININE  1.02 (H) 11/09/2019 1137   CREATININE 0.7 03/05/2016 1139      Component Value Date/Time   CALCIUM 9.6 01/21/2020 1137   CALCIUM 10.0 12/18/2016 1314   CALCIUM 9.3 03/05/2016 1139   ALKPHOS 89 01/21/2020 1137   ALKPHOS 94 (H) 12/18/2016 1314   ALKPHOS 127 03/05/2016 1139   AST 32 01/21/2020 1137   AST 29 03/05/2016 1139   ALT 16 01/21/2020 1137   ALT 26 12/18/2016 1314   ALT 22 03/05/2016 1139   BILITOT 1.8 (H) 01/21/2020 1137   BILITOT 1.75 (H) 03/05/2016 1139       Impression and Plan: Ms. Tayler is a very pleasant53 yo female with chronic thrombocytopenia and iron deficiency anemia secondary to intermittent GI blood loss.  She was hospitalized recently.  She had marked iron deficiency.  I am sure her iron is probably still on the low side.  We will see what her iron studies look like.  I hope that the pleural fluid does not come back on her.  I know this would be quite a problem for her.  Thankfully, there is no malignancy.  We will get her back in 1 month.  We will get her in sooner if her iron is on the low side.  I am just happy that she does look better.   Josph Macho, MD 12/30/20212:49 PM

## 2020-01-25 ENCOUNTER — Inpatient Hospital Stay: Payer: HMO | Admitting: Family Medicine

## 2020-01-25 LAB — IRON AND TIBC
Iron: 144 ug/dL — ABNORMAL HIGH (ref 41–142)
Saturation Ratios: 61 % — ABNORMAL HIGH (ref 21–57)
TIBC: 238 ug/dL (ref 236–444)
UIBC: 94 ug/dL — ABNORMAL LOW (ref 120–384)

## 2020-01-25 LAB — FERRITIN: Ferritin: 129 ng/mL (ref 11–307)

## 2020-01-26 ENCOUNTER — Encounter: Payer: Self-pay | Admitting: Family Medicine

## 2020-01-27 ENCOUNTER — Other Ambulatory Visit: Payer: Self-pay | Admitting: *Deleted

## 2020-02-01 ENCOUNTER — Other Ambulatory Visit (INDEPENDENT_AMBULATORY_CARE_PROVIDER_SITE_OTHER): Payer: HMO

## 2020-02-01 ENCOUNTER — Encounter: Payer: Self-pay | Admitting: Internal Medicine

## 2020-02-01 ENCOUNTER — Ambulatory Visit (INDEPENDENT_AMBULATORY_CARE_PROVIDER_SITE_OTHER): Payer: HMO | Admitting: Internal Medicine

## 2020-02-01 ENCOUNTER — Ambulatory Visit (INDEPENDENT_AMBULATORY_CARE_PROVIDER_SITE_OTHER)
Admission: RE | Admit: 2020-02-01 | Discharge: 2020-02-01 | Disposition: A | Discharge status: Home / Self Care | Payer: HMO | Source: Ambulatory Visit | Attending: Internal Medicine | Admitting: Internal Medicine

## 2020-02-01 ENCOUNTER — Other Ambulatory Visit: Payer: Self-pay

## 2020-02-01 VITALS — BP 110/64 | HR 95 | Ht 67.0 in | Wt 185.0 lb

## 2020-02-01 DIAGNOSIS — K31819 Angiodysplasia of stomach and duodenum without bleeding: Secondary | ICD-10-CM

## 2020-02-01 DIAGNOSIS — J90 Pleural effusion, not elsewhere classified: Secondary | ICD-10-CM

## 2020-02-01 DIAGNOSIS — D500 Iron deficiency anemia secondary to blood loss (chronic): Secondary | ICD-10-CM

## 2020-02-01 DIAGNOSIS — K862 Cyst of pancreas: Secondary | ICD-10-CM

## 2020-02-01 DIAGNOSIS — K7031 Alcoholic cirrhosis of liver with ascites: Secondary | ICD-10-CM

## 2020-02-01 DIAGNOSIS — K3189 Other diseases of stomach and duodenum: Secondary | ICD-10-CM

## 2020-02-01 DIAGNOSIS — K766 Portal hypertension: Secondary | ICD-10-CM

## 2020-02-01 LAB — COMPREHENSIVE METABOLIC PANEL
ALT: 21 U/L (ref 0–35)
AST: 40 U/L — ABNORMAL HIGH (ref 0–37)
Albumin: 3.6 g/dL (ref 3.5–5.2)
Alkaline Phosphatase: 95 U/L (ref 39–117)
BUN: 15 mg/dL (ref 6–23)
CO2: 29 mEq/L (ref 19–32)
Calcium: 9.8 mg/dL (ref 8.4–10.5)
Chloride: 105 mEq/L (ref 96–112)
Creatinine, Ser: 1.01 mg/dL (ref 0.40–1.20)
GFR: 50.9 mL/min — ABNORMAL LOW (ref >60.00)
Glucose, Bld: 174 mg/dL — ABNORMAL HIGH (ref 70–99)
Potassium: 3.9 mEq/L (ref 3.5–5.1)
Sodium: 138 mEq/L (ref 135–145)
Total Bilirubin: 1.8 mg/dL — ABNORMAL HIGH (ref 0.2–1.2)
Total Protein: 6.1 g/dL (ref 6.0–8.3)

## 2020-02-01 LAB — CBC WITH DIFFERENTIAL/PLATELET
Basophils Absolute: 0 10*3/uL (ref 0.0–0.1)
Basophils Relative: 0.8 % (ref 0.0–3.0)
Eosinophils Absolute: 0.1 10*3/uL (ref 0.0–0.7)
Eosinophils Relative: 2 % (ref 0.0–5.0)
HCT: 32.8 % — ABNORMAL LOW (ref 36.0–46.0)
Hemoglobin: 10.7 g/dL — ABNORMAL LOW (ref 12.0–15.0)
Lymphocytes Relative: 14.8 % (ref 12.0–46.0)
Lymphs Abs: 0.7 10*3/uL (ref 0.7–4.0)
MCHC: 32.5 g/dL (ref 30.0–36.0)
MCV: 85.3 fl (ref 78.0–100.0)
Monocytes Absolute: 0.4 10*3/uL (ref 0.1–1.0)
Monocytes Relative: 8.7 % (ref 3.0–12.0)
Neutro Abs: 3.7 10*3/uL (ref 1.4–7.7)
Neutrophils Relative %: 73.7 % (ref 43.0–77.0)
Platelets: 75 10*3/uL — ABNORMAL LOW (ref 150.0–400.0)
RBC: 3.85 Mil/uL — ABNORMAL LOW (ref 3.87–5.11)
RDW: 28.3 % — ABNORMAL HIGH (ref 11.5–15.5)
WBC: 5 10*3/uL (ref 4.0–10.5)

## 2020-02-01 NOTE — Assessment & Plan Note (Signed)
Given her age and comorbidities unlikely to be aggressive and follow-up with this lesion (lesions).  Will reassess.

## 2020-02-01 NOTE — Assessment & Plan Note (Signed)
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  Repeat chest x-ray.  I think she is going to need more aggressive diuresis.  Recheck CMET

## 2020-02-01 NOTE — Assessment & Plan Note (Signed)
As best I can tell this seems stable though there was decompensation question if it is related to her cirrhosis.  Certainly could have hepatic hydrothorax.  See other assessment and plan, for further evaluation to see about decompensation.

## 2020-02-01 NOTE — Progress Notes (Signed)
Jessica Dennis 87 y.o. 1932-04-01 [id number]  Assessment & Plan:   Encounter Diagnoses  Name Primary?  . Pleural effusion on right Yes  . Alcoholic cirrhosis of liver with ascites ([place])   . Iron deficiency anemia due to chronic blood loss   . Pancreatic cyst   . GAVE (gastric antral vascular ectasia)   . Portal hypertensive gastropathy (HCC)     Iron deficiency anemia due to chronic blood loss I think this might be the underlying culprit as she had a progressive decline in her hemoglobin over months.  This could have driven many of her signs and symptoms including edema shortness of breath pleural effusion (cardiac output issues), and weakness etc.  I need to look at her esophagus and stomach again with an EGD will do so.  May need ablation treatment of what I thought was at least some gastric antral vascular ectasia plus or minus portal gastropathy.  She may have developed varices.  Banding is possible.  In the meantime recheck CBC.  She has been given parenteral iron.  Probably needs closer follow-up of her hemoglobin and iron levels than we had done in the past.  Though that had been working.  Pancreatic cyst Given her age and comorbidities unlikely to be aggressive and follow-up with this lesion (lesions).  Will reassess.  Pleural effusion on right Hepatic hydrothorax certainly seems possible.  Repeat chest x-ray.  I think she is going to need more aggressive diuresis.  Recheck CMET  Alcoholic cirrhosis of liver with ascites (HCC) As best I can tell this seems stable though there was decompensation question if it is related to her cirrhosis.  Certainly could have hepatic hydrothorax.  See other assessment and plan, for further evaluation to see about decompensation.   I appreciate the opportunity to care for this patient. CC: Ann Held, DO    Subjective:   Chief Complaint:  HPI Patient is an 87 year old white woman with alcoholic cirrhosis and history of  ascites who had been relatively stable without problems for a number of years, she also has a history of iron deficiency anemia thought likely due to gastric antral vascular ectasia plus or minus portal hypertension.  She is accompanied by Myrtie Soman (son). She was hospitalized on December 21 with a right-sided pleural effusion, she had a thoracentesis with 2 L of fluid taken off this procedure was performed 1223. Hemoglobin was 6.3 and she was transfused up to 8.1, 1 unit PRBC.  With follow-up hemoglobin on 1230 of 9.2 IV iron infusion was ordered as well. The pleural effusion fluid was a transudate.  She was also diet recent about 4 L over 24 hours using IV Lasix. CT scanning prior to admission demonstrated partial collapse of the right lower lobe with a large pleural effusion on the right on the chest imaging and on the abdominal and pelvis imaging she had a cirrhotic liver probable gallstone 1.1 cm hypodensity in pancreatic head and no ascites she had an MRI performed which demonstrated 3 cystic lesions in the pancreas up to 25 mm maximum dimension lesions were incompletely characterized due to motion degradation on the current study  She has a history of gastric antral vascular ectasia on last EGD in August 2019 we have manage this medically with supportive therapy through hematology and Dr. Marin Olp.  Note that her ferritin was 11 on 1221 when she was admitted and it was up to 129 on 1230 after parenteral iron.  She had an echocardiogram in  November 2021 with normal LV function no changes from prior study.  She also had a stress Myoview that was negative.  She had seen cardiology for shortness of breath and dizziness in the fall.  Note that her hemoglobin was normal 9 months ago at 12.7 then 11.8 11.9 and in September was 9.9.  Then 9.3 later in September and then 9 in October.  She had been hospitalized in September due to increased falls.  She had a sinus tachycardia and mild acute kidney injury and  hyperkalemia.  Her diuretics were held temporarily.  She thought she might have been volume depleted.  TSH was normal in October.   At this point she and Gershon Mussel say she is much better stronger.  She is able to lie flat so no orthopnea.  She has not had a cough necessarily.  She does get dyspnea on exertion still.    Allergies  Allergen Reactions  . Tizanidine Other (See Comments)    Hallucinate, confused    Current Meds  Medication Sig  . Accu-Chek FastClix Lancets MISC CHECK FOR BLOOD SUGAR DAILY  . Ascorbic Acid (VITAMIN C) 1000 MG tablet Take 1,000 mg by mouth daily.  Marland Kitchen CALCIUM CARBONATE-VITAMIN D PO Take 1 tablet by mouth daily. Calcium 1200mg   Vitamin D 5000 units  Pt also has a calcium 600mg  plus 800 units vitamin D supplement  . folic acid (FOLVITE) 1 MG tablet Take 1 tablet (1 mg total) by mouth daily.  . furosemide (LASIX) 20 MG tablet TAKE ONE TABLET BY MOUTH TWICE A DAY  . Insulin Aspart Prot & Aspart (NOVOLOG MIX 70/30 [place]) Inject 25-35 Units into the skin See admin instructions. 35 units in the mornings and 25 at night  . lactulose (CHRONULAC) 10 GM/15ML solution Take 30 mLs (20 g total) by mouth 2 (two) times daily as needed for mild constipation.  Marland Kitchen latanoprost (XALATAN) 0.005 % ophthalmic solution Place 1 drop into both eyes at bedtime.   Marland Kitchen levothyroxine (SYNTHROID) 75 MCG tablet Take 1 tablet (75 mcg total) by mouth daily.  Marland Kitchen LORazepam (ATIVAN) 0.5 MG tablet Take 0.5 mg by mouth daily as needed for anxiety.  . metFORMIN (GLUCOPHAGE-XR) 500 MG 24 hr tablet Take 1 tablet (500 mg total) by mouth daily.  . metoprolol succinate (TOPROL-XL) 25 MG 24 hr tablet TAKE 0.5 TABLETS BY MOUTH DAILY  . pantoprazole (PROTONIX) 40 MG tablet Take 1 tablet (40 mg total) by mouth daily.  . simvastatin (ZOCOR) 20 MG tablet TAKE ONE TABLET BY MOUTH EVERY NIGHT AT BEDTIME  . spironolactone (ALDACTONE) 25 MG tablet Take 0.5 tablets (12.5 mg total) by mouth daily.  Marland Kitchen SSD 1 % cream Apply 1  application topically daily as needed (skin issues).  . vitamin E 180 MG (400 UNITS) capsule Take 400 Units by mouth daily.   Past Medical History:  Diagnosis Date  . Anemia   . Angiodysplasia of ascending colon 10/25/2014  . Anxiety   . Arthritis   . Borderline diabetes   . Cancer of the skin, basal cell 09/03/2012  . Cirrhosis ([place])   . Diabetes mellitus without complication ([place])   . Diverticular disease   . 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 GERD (gastroesophageal reflux disease)   . Hypertension   . Iron deficiency anemia due to chronic blood loss 01/19/2015  . Portal hypertensive gastropathy ([place]) 08/02/2016   ? Some GAVE also  . Thyroid disease    Past Surgical History:  Procedure Laterality Date  .  ABDOMINAL HYSTERECTOMY    . APPENDECTOMY    . COLONOSCOPY    . ESOPHAGOGASTRODUODENOSCOPY    . IR PARACENTESIS  05/25/2016  . LEG SKIN LESION  BIOPSY / EXCISION  12/11/14  . MOHS SURGERY     ankle  . TONSILLECTOMY    . TOTAL HIP ARTHROPLASTY Bilateral 1993, 2006  . UPPER GASTROINTESTINAL ENDOSCOPY  09/09/2017   Social History   Social History Narrative   The patient is divorced. She is originally from [place]. She has 2 sons. 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   08/10/2014   family history includes Bladder Cancer in her father; Diabetes in her mother; Heart attack in her father.   Review of Systems See HPI Using a walker due to knee pain with walking, trying to prevent falls Objective:   Physical Exam BP 110/64 (BP Location: Left Arm, Patient Position: Sitting, Cuff Size: Normal)   Pulse 95   Ht 5\' 7"  (1.702 m)   Wt 185 lb (83.9 kg)   BMI 28.98 kg/m  Elderly white woman looking chronically ill in no acute distress She is alert and oriented x3 No asterixis detected Lungs show decreased breath sounds and dullness at the right base otherwise clear Heart without JVD distant but regular rhythm and rate S1-S2 no  murmurs detected no gallops The abdomen is soft the liver edge is palpable in the right upper quadrant with a firm liver that is shrunken question slightly ballotable without organomegaly otherwise no masses bowel sounds are present Extremities show 1+ lower extremity pretibial edema on the left slightly greater than the right Gait is slow and somewhat shuffling she is now using a walker to prevent falling

## 2020-02-01 NOTE — H&P (View-Only) (Signed)
Jessica Dennis 87 y.o. 1932-04-01 [id number]  Assessment & Plan:   Encounter Diagnoses  Name Primary?  . Pleural effusion on right Yes  . Alcoholic cirrhosis of liver with ascites ([place])   . Iron deficiency anemia due to chronic blood loss   . Pancreatic cyst   . GAVE (gastric antral vascular ectasia)   . Portal hypertensive gastropathy (HCC)     Iron deficiency anemia due to chronic blood loss I think this might be the underlying culprit as she had a progressive decline in her hemoglobin over months.  This could have driven many of her signs and symptoms including edema shortness of breath pleural effusion (cardiac output issues), and weakness etc.  I need to look at her esophagus and stomach again with an EGD will Jessica Dennis so.  May need ablation treatment of what I thought was at least some gastric antral vascular ectasia plus or minus portal gastropathy.  She may have developed varices.  Banding is possible.  In the meantime recheck CBC.  She has been given parenteral iron.  Probably needs closer follow-up of her hemoglobin and iron levels than we had done in the past.  Though that had been working.  Pancreatic cyst Given her age and comorbidities unlikely to be aggressive and follow-up with this lesion (lesions).  Will reassess.  Pleural effusion on right Hepatic hydrothorax certainly seems possible.  Repeat chest x-ray.  I think she is going to need more aggressive diuresis.  Recheck CMET  Alcoholic cirrhosis of liver with ascites (HCC) As best I can tell this seems stable though there was decompensation question if it is related to her cirrhosis.  Certainly could have hepatic hydrothorax.  See other assessment and plan, for further evaluation to see about decompensation.   I appreciate the opportunity to care for this patient. CC: Jessica Held, Jessica Dennis    Subjective:   Chief Complaint:  HPI Patient is an 87 year old white woman with alcoholic cirrhosis and history of  ascites who had been relatively stable without problems for a number of years, she also has a history of iron deficiency anemia thought likely due to gastric antral vascular ectasia plus or minus portal hypertension.  She is accompanied by Jessica Dennis (son). She was hospitalized on December 21 with a right-sided pleural effusion, she had a thoracentesis with 2 L of fluid taken off this procedure was performed 1223. Hemoglobin was 6.3 and she was transfused up to 8.1, 1 unit PRBC.  With follow-up hemoglobin on 1230 of 9.2 IV iron infusion was ordered as well. The pleural effusion fluid was a transudate.  She was also diet recent about 4 L over 24 hours using IV Lasix. CT scanning prior to admission demonstrated partial collapse of the right lower lobe with a large pleural effusion on the right on the chest imaging and on the abdominal and pelvis imaging she had a cirrhotic liver probable gallstone 1.1 cm hypodensity in pancreatic head and no ascites she had an MRI performed which demonstrated 3 cystic lesions in the pancreas up to 25 mm maximum dimension lesions were incompletely characterized due to motion degradation on the current study  She has a history of gastric antral vascular ectasia on last EGD in August 2019 we have manage this medically with supportive therapy through hematology and Dr. Marin Olp.  Note that her ferritin was 11 on 1221 when she was admitted and it was up to 129 on 1230 after parenteral iron.  She had an echocardiogram in  November 2021 with normal LV function no changes from prior study.  She also had a stress Myoview that was negative.  She had seen cardiology for shortness of breath and dizziness in the fall.  Note that her hemoglobin was normal 9 months ago at 12.7 then 11.8 11.9 and in September was 9.9.  Then 9.3 later in September and then 9 in October.  She had been hospitalized in September due to increased falls.  She had a sinus tachycardia and mild acute kidney injury and  hyperkalemia.  Her diuretics were Dennis temporarily.  She thought she might have been volume depleted.  TSH was normal in October.   At this point she and Jessica Dennis say she is much better stronger.  She is able to lie flat so no orthopnea.  She has not had a cough necessarily.  She does get dyspnea on exertion still.    Allergies  Allergen Reactions  . Tizanidine Other (See Comments)    Hallucinate, confused    Current Meds  Medication Sig  . Accu-Chek FastClix Lancets MISC CHECK FOR BLOOD SUGAR DAILY  . Ascorbic Acid (VITAMIN C) 1000 MG tablet Take 1,000 mg by mouth daily.  Marland Kitchen CALCIUM CARBONATE-VITAMIN D PO Take 1 tablet by mouth daily. Calcium 1200mg   Vitamin D 5000 units  Pt also has a calcium 600mg  plus 800 units vitamin D supplement  . folic acid (FOLVITE) 1 MG tablet Take 1 tablet (1 mg total) by mouth daily.  . furosemide (LASIX) 20 MG tablet TAKE ONE TABLET BY MOUTH TWICE A DAY  . Insulin Aspart Prot & Aspart (NOVOLOG MIX 70/30 [place]) Inject 25-35 Units into the skin See admin instructions. 35 units in the mornings and 25 at night  . lactulose (CHRONULAC) 10 GM/15ML solution Take 30 mLs (20 g total) by mouth 2 (two) times daily as needed for mild constipation.  Marland Kitchen latanoprost (XALATAN) 0.005 % ophthalmic solution Place 1 drop into both eyes at bedtime.   Marland Kitchen levothyroxine (SYNTHROID) 75 MCG tablet Take 1 tablet (75 mcg total) by mouth daily.  Marland Kitchen LORazepam (ATIVAN) 0.5 MG tablet Take 0.5 mg by mouth daily as needed for anxiety.  . metFORMIN (GLUCOPHAGE-XR) 500 MG 24 hr tablet Take 1 tablet (500 mg total) by mouth daily.  . metoprolol succinate (TOPROL-XL) 25 MG 24 hr tablet TAKE 0.5 TABLETS BY MOUTH DAILY  . pantoprazole (PROTONIX) 40 MG tablet Take 1 tablet (40 mg total) by mouth daily.  . simvastatin (ZOCOR) 20 MG tablet TAKE ONE TABLET BY MOUTH EVERY NIGHT AT BEDTIME  . spironolactone (ALDACTONE) 25 MG tablet Take 0.5 tablets (12.5 mg total) by mouth daily.  Marland Kitchen SSD 1 % cream Apply 1  application topically daily as needed (skin issues).  . vitamin E 180 MG (400 UNITS) capsule Take 400 Units by mouth daily.   Past Medical History:  Diagnosis Date  . Anemia   . Angiodysplasia of ascending colon 10/25/2014  . Anxiety   . Arthritis   . Borderline diabetes   . Cancer of the skin, basal cell 09/03/2012  . Cirrhosis ([place])   . Diabetes mellitus without complication ([place])   . Diverticular disease   . 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 GERD (gastroesophageal reflux disease)   . Hypertension   . Iron deficiency anemia due to chronic blood loss 01/19/2015  . Portal hypertensive gastropathy ([place]) 08/02/2016   ? Some GAVE also  . Thyroid disease    Past Surgical History:  Procedure Laterality Date  .  ABDOMINAL HYSTERECTOMY    . APPENDECTOMY    . COLONOSCOPY    . ESOPHAGOGASTRODUODENOSCOPY    . IR PARACENTESIS  05/25/2016  . LEG SKIN LESION  BIOPSY / EXCISION  12/11/14  . MOHS SURGERY     ankle  . TONSILLECTOMY    . TOTAL HIP ARTHROPLASTY Bilateral 1993, 2006  . UPPER GASTROINTESTINAL ENDOSCOPY  09/09/2017   Social History   Social History Narrative   The patient is divorced. She is originally from [place]. She has 2 sons. 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   08/10/2014   family history includes Bladder Cancer in her father; Diabetes in her mother; Heart attack in her father.   Review of Systems See HPI Using a walker due to knee pain with walking, trying to prevent falls Objective:   Physical Exam BP 110/64 (BP Location: Left Arm, Patient Position: Sitting, Cuff Size: Normal)   Pulse 95   Ht 5\' 7"  (1.702 m)   Wt 185 lb (83.9 kg)   BMI 28.98 kg/m  Elderly white woman looking chronically ill in no acute distress She is alert and oriented x3 No asterixis detected Lungs show decreased breath sounds and dullness at the right base otherwise clear Heart without JVD distant but regular rhythm and rate S1-S2 no  murmurs detected no gallops The abdomen is soft the liver edge is palpable in the right upper quadrant with a firm liver that is shrunken question slightly ballotable without organomegaly otherwise no masses bowel sounds are present Extremities show 1+ lower extremity pretibial edema on the left slightly greater than the right Gait is slow and somewhat shuffling she is now using a walker to prevent falling

## 2020-02-01 NOTE — Assessment & Plan Note (Signed)
I think this might be the underlying culprit as she had a progressive decline in her hemoglobin over months.  This could have driven many of her signs and symptoms including edema shortness of breath pleural effusion (cardiac output issues), and weakness etc.  I need to look at her esophagus and stomach again with an EGD will do so.  May need ablation treatment of what I thought was at least some gastric antral vascular ectasia plus or minus portal gastropathy.  She may have developed varices.  Banding is possible.  In the meantime recheck CBC.  She has been given parenteral iron.  Probably needs closer follow-up of her hemoglobin and iron levels than we had done in the past.  Though that had been working.

## 2020-02-01 NOTE — Patient Instructions (Signed)
Your provider has requested that you go to the basement level for lab work before leaving today. Press "B" on the elevator. The lab is located at the first door on the left as you exit the elevator.  Please stop by the x-ray department before leaving today.   You have been scheduled for an endoscopy. Please follow written instructions given to you at your visit today. If you use inhalers (even only as needed), please bring them with you on the day of your procedure.    I appreciate the opportunity to care for you. Silvano Rusk, MD, [facility]

## 2020-02-02 ENCOUNTER — Other Ambulatory Visit: Payer: Self-pay

## 2020-02-02 DIAGNOSIS — J90 Pleural effusion, not elsewhere classified: Secondary | ICD-10-CM

## 2020-02-02 DIAGNOSIS — K7031 Alcoholic cirrhosis of liver with ascites: Secondary | ICD-10-CM

## 2020-02-02 MED ORDER — FUROSEMIDE 40 MG PO TABS: 40.0000 mg | ORAL_TABLET | Freq: Every morning | ORAL | 1 refills | Status: DC

## 2020-02-02 MED ORDER — SPIRONOLACTONE 50 MG PO TABS: 50.0000 mg | ORAL_TABLET | Freq: Every day | ORAL | 1 refills | Status: DC

## 2020-02-09 ENCOUNTER — Encounter: Payer: Self-pay | Admitting: Family Medicine

## 2020-02-09 ENCOUNTER — Other Ambulatory Visit: Payer: Self-pay

## 2020-02-09 ENCOUNTER — Ambulatory Visit (INDEPENDENT_AMBULATORY_CARE_PROVIDER_SITE_OTHER): Payer: HMO | Admitting: Family Medicine

## 2020-02-09 ENCOUNTER — Ambulatory Visit (HOSPITAL_BASED_OUTPATIENT_CLINIC_OR_DEPARTMENT_OTHER)
Admission: RE | Admit: 2020-02-09 | Discharge: 2020-02-09 | Disposition: A | Discharge status: Home / Self Care | Payer: HMO | Source: Ambulatory Visit | Attending: Family Medicine | Admitting: Family Medicine

## 2020-02-09 VITALS — BP 110/60 | HR 91 | Temp 98.1°F | Resp 18 | Ht 67.0 in | Wt 181.4 lb

## 2020-02-09 DIAGNOSIS — Z23 Encounter for immunization: Secondary | ICD-10-CM | POA: Diagnosis not present

## 2020-02-09 DIAGNOSIS — I482 Chronic atrial fibrillation, unspecified: Secondary | ICD-10-CM

## 2020-02-09 DIAGNOSIS — J90 Pleural effusion, not elsewhere classified: Secondary | ICD-10-CM

## 2020-02-09 DIAGNOSIS — Z111 Encounter for screening for respiratory tuberculosis: Secondary | ICD-10-CM | POA: Diagnosis not present

## 2020-02-09 DIAGNOSIS — I5033 Acute on chronic diastolic (congestive) heart failure: Secondary | ICD-10-CM | POA: Diagnosis not present

## 2020-02-09 DIAGNOSIS — E1165 Type 2 diabetes mellitus with hyperglycemia: Secondary | ICD-10-CM

## 2020-02-09 DIAGNOSIS — K7031 Alcoholic cirrhosis of liver with ascites: Secondary | ICD-10-CM

## 2020-02-09 DIAGNOSIS — I10 Essential (primary) hypertension: Secondary | ICD-10-CM

## 2020-02-09 DIAGNOSIS — E039 Hypothyroidism, unspecified: Secondary | ICD-10-CM

## 2020-02-09 DIAGNOSIS — E785 Hyperlipidemia, unspecified: Secondary | ICD-10-CM

## 2020-02-09 DIAGNOSIS — Z794 Long term (current) use of insulin: Secondary | ICD-10-CM

## 2020-02-09 NOTE — Patient Instructions (Signed)
Pleural Effusion Pleural effusion is an abnormal buildup of fluid in the layers of tissue between the lungs and the inside of the chest (pleural space). The two layers of tissue that line the lungs and the inside of the chest are called pleura. Usually, there is no air in the space between the pleura, only a thin layer of fluid. Some conditions can cause a large amount of fluid to build up, which can cause the lung to collapse if untreated. A pleural effusion is usually caused by another disease that requires treatment. What are the causes? Pleural effusion can be caused by:  Heart failure.  Certain infections, such as pneumonia or tuberculosis.  Cancer.  A blood clot in the lung (pulmonary embolism).  Complications from surgery, such as from open heart surgery.  Liver disease (cirrhosis).  Kidney disease. What are the signs or symptoms? In some cases, pleural effusion may cause no symptoms. If symptoms are present, they may include:  Shortness of breath, especially when lying down.  Chest pain. This may get worse when taking a deep breath.  Fever.  Dry, long-lasting (chronic) cough.  Hiccups.  Rapid breathing. An underlying condition that is causing the pleural effusion (such as heart failure, pneumonia, blood clots, tuberculosis, or cancer) may also cause other symptoms. How is this diagnosed? This condition may be diagnosed based on:  Your symptoms and medical history.  A physical exam.  A chest X-ray.  A procedure to use a needle to remove fluid from the pleural space (thoracentesis). This fluid is tested.  Other imaging studies of the chest, such as ultrasound or CT scan. How is this treated? Depending on the cause of your condition, treatment may include:  Treating the underlying condition that is causing the effusion. When that condition improves, the effusion will also improve. Examples of treatment for underlying conditions include: ? Antibiotic medicines to  treat an infection. ? Diuretics or other heart medicines to treat heart failure.  Thoracentesis.  Placing a thin flexible tube under your skin and into your chest to continuously drain the effusion (indwelling pleural catheter).  Surgery to remove the outer layer of tissue from the pleural space (decortication).  A procedure to put medicine into the chest cavity to seal the pleural space and prevent fluid buildup (pleurodesis).  Chemotherapy and radiation therapy, if you have cancerous (malignant) pleural effusion. These treatments are typically used to treat cancer. They kill certain cells in the body.   Follow these instructions at home:  Take over-the-counter and prescription medicines only as told by your health care provider.  Ask your health care provider what activities are safe for you.  Keep track of how long you are able to do mild exercise (such as walking) before you get short of breath. Write down this information to share with your health care provider. Your ability to exercise should improve over time.  Do not use any products that contain nicotine or tobacco, such as cigarettes and e-cigarettes. If you need help quitting, ask your health care provider.  Keep all follow-up visits as told by your health care provider. This is important. Contact a health care provider if:  The amount of time that you are able to do mild exercise: ? Decreases. ? Does not improve with time.  You have a fever. Get help right away if:  You are short of breath.  You develop chest pain.  You develop a new cough. Summary  Pleural effusion is an abnormal buildup of fluid in   the layers of tissue between the lungs and the inside of the chest.  Pleural effusion can have many causes, including heart failure, pulmonary embolism, infections, or cancer.  Symptoms of pleural effusion can include shortness of breath, chest pain, fever, long-lasting (chronic) cough, hiccups, or rapid  breathing.  Diagnosis often involves making images of the chest (such as with ultrasound or X-ray) and removing fluid (thoracentesis) to send for testing.  Treatment for pleural effusion depends on what underlying condition is causing it. This information is not intended to replace advice given to you by your health care provider. Make sure you discuss any questions you have with your health care provider. Document Revised: 10/09/2019 Document Reviewed: 09/10/2019 Elsevier Patient Education  2021 [employer]  

## 2020-02-10 LAB — BASIC METABOLIC PANEL
BUN: 22 mg/dL (ref 6–23)
CO2: 30 mEq/L (ref 19–32)
Calcium: 9.8 mg/dL (ref 8.4–10.5)
Chloride: 103 mEq/L (ref 96–112)
Creatinine, Ser: 1.36 mg/dL — ABNORMAL HIGH (ref 0.40–1.20)
GFR: 35.61 mL/min — ABNORMAL LOW (ref >60.00)
Glucose, Bld: 247 mg/dL — ABNORMAL HIGH (ref 70–99)
Potassium: 3.8 mEq/L (ref 3.5–5.1)
Sodium: 139 mEq/L (ref 135–145)

## 2020-02-10 NOTE — Progress Notes (Signed)
Patient ID: Jessica Dennis, female    DOB: [birth year]  Age: 87 y.o. MRN: [id number]    Subjective:  Subjective  HPI Jessica Dennis presents for f/u from hospital for pleural effusion  / pneumonia / chf and citrrhosis of the liver.   The sob is better per her daughter and pt states she is feeling better .  Dr Carlean Purl asked that she have cxr and bmet and they are asking to do it here so they do not have to go to [place].   Pt also needs fl 2 filled out for assisted living  Review of Systems  Constitutional: Positive for fatigue. Negative for activity change, appetite change and unexpected weight change.  Respiratory: Negative for cough and shortness of breath.   Cardiovascular: Negative for chest pain and palpitations.  Psychiatric/Behavioral: Negative for behavioral problems and dysphoric mood. The patient is not nervous/anxious.     History Past Medical History:  Diagnosis Date  . Anemia   . Angiodysplasia of ascending colon 10/25/2014  . Anxiety   . Arthritis   . Borderline diabetes   . Cancer of the skin, basal cell 09/03/2012  . Cirrhosis ([place])   . Diabetes mellitus without complication ([place])   . Diverticular disease   . 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 GERD (gastroesophageal reflux disease)   . Hypertension   . Iron deficiency anemia due to chronic blood loss 01/19/2015  . Portal hypertensive gastropathy ([place]) 08/02/2016   ? Some GAVE also  . Thyroid disease     She has a past surgical history that includes Abdominal hysterectomy; Total hip arthroplasty (Bilateral, 1993, 2006); Appendectomy; Tonsillectomy; Leg skin lesion  biopsy / excision (12/11/14); Mohs surgery; Esophagogastroduodenoscopy; IR Paracentesis (05/25/2016); Colonoscopy; and Upper gastrointestinal endoscopy (09/09/2017).   Her family history includes Bladder Cancer in her father; Diabetes in her mother; Heart attack in her father.She reports that she has never smoked. She has never used smokeless  tobacco. She reports current alcohol use. She reports that she does not use drugs.  Current Outpatient Medications on File Prior to Visit  Medication Sig Dispense Refill  . Accu-Chek FastClix Lancets MISC CHECK FOR BLOOD SUGAR DAILY 102 each 5  . Ascorbic Acid (VITAMIN C) 1000 MG tablet Take 1,000 mg by mouth daily.    Marland Kitchen CALCIUM CARBONATE-VITAMIN D PO Take 1 tablet by mouth daily. Calcium 1200mg   Vitamin D 5000 units  Pt also has a calcium 600mg  plus 800 units vitamin D supplement    . folic acid (FOLVITE) 1 MG tablet Take 1 tablet (1 mg total) by mouth daily.    . furosemide (LASIX) 40 MG tablet Take 1 tablet (40 mg total) by mouth in the morning. 30 tablet 1  . Insulin Aspart Prot & Aspart (NOVOLOG MIX 70/30 [place]) Inject 25-35 Units into the skin See admin instructions. 35 units in the mornings and 25 at night    . lactulose (CHRONULAC) 10 GM/15ML solution Take 30 mLs (20 g total) by mouth 2 (two) times daily as needed for mild constipation. 236 mL 2  . latanoprost (XALATAN) 0.005 % ophthalmic solution Place 1 drop into both eyes at bedtime.     Marland Kitchen levothyroxine (SYNTHROID) 75 MCG tablet Take 1 tablet (75 mcg total) by mouth daily. 90 tablet 1  . LORazepam (ATIVAN) 0.5 MG tablet Take 0.5 mg by mouth daily as needed for anxiety.    . metFORMIN (GLUCOPHAGE-XR) 500 MG 24 hr tablet Take 1 tablet (500 mg total) by mouth daily.  180 tablet 1  . metoprolol succinate (TOPROL-XL) 25 MG 24 hr tablet TAKE 0.5 TABLETS BY MOUTH DAILY 30 tablet 0  . pantoprazole (PROTONIX) 40 MG tablet Take 1 tablet (40 mg total) by mouth daily. 90 tablet 3  . simvastatin (ZOCOR) 20 MG tablet TAKE ONE TABLET BY MOUTH EVERY NIGHT AT BEDTIME 90 tablet 0  . spironolactone (ALDACTONE) 50 MG tablet Take 1 tablet (50 mg total) by mouth daily. 30 tablet 1  . SSD 1 % cream Apply 1 application topically daily as needed (skin issues).    . vitamin E 180 MG (400 UNITS) capsule Take 400 Units by mouth daily.     No current  facility-administered medications on file prior to visit.     Objective:  Objective  Physical Exam Vitals and nursing note reviewed.  Constitutional:      Appearance: She is well-developed and well-nourished.  HENT:     Head: Normocephalic and atraumatic.     Right Ear: External ear normal.     Left Ear: External ear normal.      Comments: + PND + errythemaEyes:     General:        Right eye: No discharge.        Left eye: No discharge.     Extraocular Movements: EOM normal.     Conjunctiva/sclera: Conjunctivae normal.  Neck:     Thyroid: No thyromegaly.     Vascular: No carotid bruit or JVD.  Cardiovascular:     Rate and Rhythm: Normal rate and regular rhythm.     Heart sounds: Normal heart sounds. No murmur heard.   Pulmonary:     Effort: Pulmonary effort is normal. No respiratory distress.     Breath sounds: Normal breath sounds. No wheezing or rales.  Chest:     Chest wall: No tenderness.  Musculoskeletal:        General: No edema.     Cervical back: Normal range of motion and neck supple.  Lymphadenopathy:     Cervical: No cervical adenopathy.  Neurological:     Mental Status: She is alert and oriented to person, place, and time.  Psychiatric:        Mood and Affect: Mood and affect normal.    BP 110/60 (BP Location: Right Arm, Patient Position: Sitting, Cuff Size: Normal)   Pulse 91   Temp 98.1 F (36.7 C) (Oral)   Resp 18   Ht 5\' 7"  (1.702 m)   Wt 181 lb 6.4 oz (82.3 kg)   SpO2 98%   BMI 28.41 kg/m  Wt Readings from Last 3 Encounters:  02/09/20 181 lb 6.4 oz (82.3 kg)  02/01/20 185 lb (83.9 kg)  01/21/20 179 lb 12.8 oz (81.6 kg)     Lab Results  Component Value Date   WBC 5.0 02/01/2020   HGB 10.7 (L) 02/01/2020   HCT 32.8 (L) 02/01/2020   PLT 75.0 (L) 02/01/2020   GLUCOSE 247 (H) 02/09/2020   CHOL 112 11/09/2019   TRIG 61 11/09/2019   HDL 46 (L) 11/09/2019   LDLCALC 52 11/09/2019   ALT 21 02/01/2020   AST 40 (H) 02/01/2020   NA 139  02/09/2020   K 3.8 02/09/2020   CL 103 02/09/2020   CREATININE 1.36 (H) 02/09/2020   BUN 22 02/09/2020   CO2 30 02/09/2020   TSH 2.65 11/09/2019   INR 1.6 (H) 01/13/2020   HGBA1C 7.6 (H) 01/13/2020   MICROALBUR 1.6 04/28/2019    DG Chest  2 View  Result Date: 02/09/2020 CLINICAL DATA:  Pleural effusion EXAM: CHEST - 2 VIEW COMPARISON:  02/01/2020 FINDINGS: Similar appearance of moderate to large right pleural effusion. Probable trace left effusion. The left lung is grossly clear. Stable cardiomediastinal silhouette with aortic atherosclerosis. Dense airspace disease at the right middle lobe and right base. IMPRESSION: Similar appearance of moderate to large right pleural effusion with right middle lobe and right lung base dense consolidation. Probable trace left effusion Electronically Signed   By: Donavan Foil M.D.   On: 02/09/2020 22:04     Assessment & Plan:  Plan  I am having Jessica Dennis "Trudi" maintain her latanoprost, vitamin C, CALCIUM CARBONATE-VITAMIN D PO, vitamin E, Insulin Aspart Prot & Aspart (NOVOLOG MIX 70/30 [place]), SSD, Accu-Chek FastClix Lancets, pantoprazole, metFORMIN, levothyroxine, simvastatin, metoprolol succinate, LORazepam, folic acid, lactulose, spironolactone, and furosemide.  No orders of the defined types were placed in this encounter.   Problem List Items Addressed This Visit      Unprioritized   Acute on chronic diastolic CHF (congestive heart failure) (HCC)    Stable con't meds and f/u cardiology      Adult hypothyroidism    Per endo      Alcoholic cirrhosis of liver with ascites ([place])    cxr to be done today and f/u GI       Chronic atrial fibrillation ([place])    Stable F/u cardiology      Essential hypertension    Well controlled, no changes to meds. Encouraged heart healthy diet such as the DASH diet and exercise as tolerated.       Hyperlipidemia LDL goal <100    Encouraged heart healthy diet, increase exercise, avoid trans fats,  consider a krill oil cap daily      Type 2 diabetes mellitus with hyperglycemia, with long-term current use of insulin (HCC)    Per endo        Other Visit Diagnoses    Pleural effusion    -  Primary   Relevant Orders   Basic metabolic panel (Completed)   DG Chest 2 View (Completed)   Encounter for TB tine test       Relevant Orders   QuantiFERON-TB Gold Plus   Need for influenza vaccination       Relevant Orders   Flu Vaccine QUAD High Dose(Fluad)    FL 2 filled out for assisted living   Follow-up: Return in about 3 months (around 05/09/2020).  Ann Held, DO

## 2020-02-10 NOTE — Assessment & Plan Note (Signed)
Well controlled, no changes to meds. Encouraged heart healthy diet such as the DASH diet and exercise as tolerated.  °

## 2020-02-10 NOTE — Progress Notes (Signed)
Pleural effusion about same though she reported to Dr. Sherrine Maples she feels better  BMET showed increased creatinine which came with increased diuretics  Sheri, please do the following:  1) refer to pulmonary re: pleural effusion and let me know who/when 2) BMET again in 1 week approximately - weather permitting dx pleural effusion and cirrhosis - would be fine to do at [facility] office again

## 2020-02-10 NOTE — Assessment & Plan Note (Signed)
Stable con't meds and f/u cardiology

## 2020-02-10 NOTE — Assessment & Plan Note (Signed)
Per endo °

## 2020-02-10 NOTE — Assessment & Plan Note (Signed)
Stable  F/u cardiology 

## 2020-02-10 NOTE — Assessment & Plan Note (Signed)
Encouraged heart healthy diet, increase exercise, avoid trans fats, consider a krill oil cap daily 

## 2020-02-10 NOTE — Assessment & Plan Note (Signed)
cxr to be done today and f/u GI

## 2020-02-11 ENCOUNTER — Other Ambulatory Visit: Payer: Self-pay

## 2020-02-11 DIAGNOSIS — J90 Pleural effusion, not elsewhere classified: Secondary | ICD-10-CM

## 2020-02-11 DIAGNOSIS — K7031 Alcoholic cirrhosis of liver with ascites: Secondary | ICD-10-CM

## 2020-02-11 LAB — QUANTIFERON-TB GOLD PLUS
Mitogen-NIL: 2.3 IU/mL
NIL: 0.02 IU/mL
QuantiFERON-TB Gold Plus: NEGATIVE
TB1-NIL: 0.01 IU/mL
TB2-NIL: 0.01 IU/mL

## 2020-02-15 ENCOUNTER — Telehealth: Payer: Self-pay | Admitting: Pharmacist

## 2020-02-15 NOTE — Progress Notes (Addendum)
Chronic Care Management Pharmacy Assistant   Name: Jessica Dennis  MRN: [id number] DOB: 1933/01/07  Reason for Encounter: Disease State for DM.  Patient Questions:  1.  Have you seen any other providers since your last visit? Yes.   2.  Any changes in your medicines or health? Yes.   PCP : Ann Held, DO    Their chronic conditions include: DM, HTN, HLD, Hypothyroidism, GERD, Neuropathy, Insomnia.  Office Visits: 02/09/20 Ann Held, DO. No medication changes.  01/12/20 (Video Visit) Iran Sizer, DO. For SOB.  No medication changes.  11/09/19 Ann Held, DO.  CHANGED Simvastatin and Levothyroxine.   Consults: 02/01/20 Georgiana Shore, MD. Images and labs taken. No medication changes.  01/21/20 Oncology for iron deficiency. STOPPED Prednicarbate 0.1% , Omega- 3 Fatty Acid, Magnesium Oxide, Apoaequorin, Ammonium and Alclometasone Dipropionate 0.05%. 12/15/19 (Telephone) INCREASED Magnesium Oxide 400 mg 2 times daily for 7 days. 11/27/19 Tobb Kardie, DO. For lightheadedness. Images taken. STARTED Spironolactone 12.5 mg. STOPPED Azithromycin 250 mg, Doxycycline 100 mg and Gabapentin 100 mg. 10/22/19 Outside services. Independent living. Camille Bal: 01/12/20 Maryann Conners, MD. For SOB. Per note patient had pleural effusion. No medication changes.  11/14/19 LongWonda Olds, MD. STARTED Doxycycline Hycate 100 mg.  10/16/19 Thresa Ross, MD for multiple falls. Xrays and CT images taken. No medication changes.  Allergies:   Allergies  Allergen Reactions   Tizanidine Other (See Comments)    Hallucinate, confused     Medications: Outpatient Encounter Medications as of 02/15/2020  Medication Sig   Accu-Chek FastClix Lancets MISC CHECK FOR BLOOD SUGAR DAILY   Ascorbic Acid (VITAMIN C) 1000 MG tablet Take 1,000 mg by mouth at bedtime.   CALCIUM CARBONATE-VITAMIN D PO Take 2 tablets by mouth at bedtime.  Calcium 1200mg   Vitamin D 3267 units   folic acid (FOLVITE) 1 MG tablet Take 1 tablet (1 mg total) by mouth daily.   furosemide (LASIX) 40 MG tablet Take 1 tablet (40 mg total) by mouth in the morning.   Insulin Aspart Prot & Aspart (NOVOLOG MIX 70/30 [place]) Inject 25-35 Units into the skin See admin instructions. Inject 35 units subcutaneously in the mornings and inject 25 units subcutaneously at night   lactulose (CHRONULAC) 10 GM/15ML solution Take 30 mLs (20 g total) by mouth 2 (two) times daily as needed for mild constipation. (Patient taking differently: Take 20 g by mouth 2 (two) times daily as needed (mild confusion (increased ammonia levels)).)   latanoprost (XALATAN) 0.005 % ophthalmic solution Place 1 drop into both eyes at bedtime.    levothyroxine (SYNTHROID) 75 MCG tablet Take 1 tablet (75 mcg total) by mouth daily.   LORazepam (ATIVAN) 0.5 MG tablet Take 0.5 mg by mouth at bedtime.   metFORMIN (GLUCOPHAGE-XR) 500 MG 24 hr tablet Take 1 tablet (500 mg total) by mouth daily.   metoprolol succinate (TOPROL-XL) 25 MG 24 hr tablet TAKE 0.5 TABLETS BY MOUTH DAILY (Patient taking differently: Take 12.5 mg by mouth daily.)   pantoprazole (PROTONIX) 40 MG tablet Take 1 tablet (40 mg total) by mouth daily. (Patient taking differently: Take 40 mg by mouth at bedtime.)   simvastatin (ZOCOR) 20 MG tablet TAKE ONE TABLET BY MOUTH EVERY NIGHT AT BEDTIME (Patient taking differently: Take 20 mg by mouth at bedtime.)   spironolactone (ALDACTONE) 50 MG tablet Take 1 tablet (50 mg total) by mouth daily.   vitamin E 180 MG (400  UNITS) capsule Take 400 Units by mouth every evening.   No facility-administered encounter medications on file as of 02/15/2020.    Current Diagnosis: Patient Active Problem List   Diagnosis Date Noted   Pancreatic cyst 02/01/2020   Pleural effusion on right 01/13/2020   Acute on chronic diastolic CHF (congestive heart failure) ([place]) 01/13/2020   Lightheadedness 11/27/2019    Nonspecific abnormal electrocardiogram (ECG) (EKG) 11/27/2019   Abnormal electrocardiogram 11/27/2019   Bilateral leg edema 11/27/2019   Type 2 diabetes mellitus without complication, with long-term current use of insulin ([place]) 11/27/2019   Primary hypertension 11/27/2019   Chronic atrial fibrillation ([place]) 11/09/2019   Shortness of breath 11/09/2019   Multiple falls    AKI (acute kidney injury) ([place]) 10/16/2019   Mixed hyperlipidemia due to type 2 diabetes mellitus ([place]) 10/27/2018   Generalized anxiety disorder 10/27/2018   Open wound, lower leg, left, initial encounter 10/27/2018   Tremor of right hand 10/27/2018   GAVE (gastric antral vascular ectasia) 0000000   Alcoholic cirrhosis of liver with ascites ([place]) 08/02/2016   Portal hypertensive gastropathy ([place]) 08/02/2016   Iron deficiency anemia due to chronic blood loss 01/19/2015   Occult GI bleeding 01/19/2015   Angiodysplasia of ascending colon 10/25/2014   H/O bone density study 04/27/2013   Type 2 diabetes mellitus with hyperglycemia, with long-term current use of insulin (HCC) 03/24/2013   Vaginal dryness, menopausal 01/20/2013   Atrophic vaginitis 01/20/2013   S/P hysterectomy 01/20/2013   Cervical nerve root disorder 11/18/2012   Shoulder weakness 11/18/2012   Cancer of the skin, basal cell 09/03/2012   Squamous cell skin cancer 09/03/2012   Thrombocytopenia ([place]) 08/14/2012   Lymphadenopathy of right cervical region 04/01/2012   Hyperglycemia 09/19/2011   Heel pain 08/06/2011   Plantar fasciitis 08/06/2011   Arthritis of knee, degenerative 08/06/2011   Gonalgia 08/06/2011   History of anemia 06/28/2011   Anxiety 06/28/2011   Essential hypertension, benign 06/28/2011   GERD (gastroesophageal reflux disease) 06/28/2011   Hyperlipidemia LDL goal <100 06/28/2011   History of hysterectomy 06/28/2011   Menopause 06/28/2011   Glaucoma 06/28/2011   DJD (degenerative joint disease) 06/28/2011   Essential  hypertension 04/27/2010   GERD without esophagitis 10/28/2009   Anemia, unspecified 05/24/2009   Hypothyroidism 11/22/2008   Adult hypothyroidism 11/22/2008   Diverticulosis of colon 05/15/2007   Elevated fasting blood sugar 05/15/2007   Other psoriasis 05/15/2007   Symptomatic menopausal or female climacteric states 05/15/2007   Impaired fasting glucose 05/15/2007    Goals Addressed   None    Recent Relevant Labs: Lab Results  Component Value Date/Time   HGBA1C 7.6 (H) 01/13/2020 06:10 AM   HGBA1C 6.3 04/28/2019 11:39 AM   MICROALBUR 1.6 04/28/2019 11:39 AM   MICROALBUR 1.0 10/27/2018 02:43 PM    Kidney Function Lab Results  Component Value Date/Time   CREATININE 1.36 (H) 02/09/2020 02:09 PM   CREATININE 1.01 02/01/2020 02:46 PM   CREATININE 1.11 (H) 01/21/2020 11:37 AM   CREATININE 1.02 (H) 11/09/2019 11:37 AM   CREATININE 1.07 (H) 05/20/2019 01:15 PM   CREATININE 1.06 (H) 02/14/2018 03:44 PM   CREATININE 0.7 03/05/2016 11:39 AM   CREATININE 0.7 11/25/2015 11:38 AM   GFR 35.61 (L) 02/09/2020 02:09 PM   GFRNONAA 49 (L) 01/21/2020 11:37 AM   GFRNONAA 85 05/11/2014 10:45 AM   GFRAA 57 (L) 12/14/2019 11:13 AM   GFRAA 55 (L) 05/20/2019 01:15 PM   GFRAA >89 05/11/2014 10:45 AM    Current antihyperglycemic  regimen:  Novolog 70/30 35 units AM +25 units PM  What recent interventions/DTPs have been made to improve glycemic control:  None  Have there been any recent hospitalizations or ED visits since last visit with CPP? Yes.   Patient daughter in law  reports hypoglycemic symptoms, including None   Patient daughter in law  reports hyperglycemic symptoms, including fatigue and weakness   How often are you checking your blood sugar? Patient daughter in law stated they don't check her sugar at home because the patient rather not do it.   What are your blood sugars ranging?  Fasting: N/A Before meals: N/A After meals: N/A Bedtime: N/A  During the week, how often  does your blood glucose drop below 70?  Patient daughter in law stated they don't check her blood sugar.   Are you checking your feet daily/regularly? Patient daughter in law stated at this time her feet are doing fine. She stated the patient will let them know if she has any pain in her feet.   Adherence Review: Is the patient currently on a STATIN medication? Simvastatin 20 mg.   Is the patient currently on ACE/ARB medication? No.  Does the patient have >5 day gap between last estimated fill dates? No   Per the patients daughter in law she gets her medication from [employer] and they currently don't have any questions at this time. She stated the patient is no longer in rehab.    Follow-Up:  Pharmacist Review   Charlann Lange, RMA Clinical Pharmacist Assistant [phone]  3 minutes spent in review, coordination, and documentation.  Reviewed by: Beverly Milch, PharmD Clinical Pharmacist [facility] [phone]

## 2020-02-17 ENCOUNTER — Other Ambulatory Visit: Payer: Self-pay

## 2020-02-17 DIAGNOSIS — J90 Pleural effusion, not elsewhere classified: Secondary | ICD-10-CM

## 2020-02-18 ENCOUNTER — Inpatient Hospital Stay: Payer: HMO

## 2020-02-18 ENCOUNTER — Other Ambulatory Visit (INDEPENDENT_AMBULATORY_CARE_PROVIDER_SITE_OTHER): Payer: HMO

## 2020-02-18 ENCOUNTER — Inpatient Hospital Stay: Payer: HMO | Admitting: Hematology & Oncology

## 2020-02-18 ENCOUNTER — Other Ambulatory Visit (HOSPITAL_COMMUNITY)
Admission: RE | Admit: 2020-02-18 | Discharge: 2020-02-18 | Disposition: A | Discharge status: Home / Self Care | Payer: HMO | Source: Ambulatory Visit | Attending: Internal Medicine | Admitting: Internal Medicine

## 2020-02-18 DIAGNOSIS — J90 Pleural effusion, not elsewhere classified: Secondary | ICD-10-CM

## 2020-02-18 DIAGNOSIS — Z01812 Encounter for preprocedural laboratory examination: Secondary | ICD-10-CM | POA: Insufficient documentation

## 2020-02-18 DIAGNOSIS — K7031 Alcoholic cirrhosis of liver with ascites: Secondary | ICD-10-CM | POA: Diagnosis not present

## 2020-02-18 DIAGNOSIS — Z20822 Contact with and (suspected) exposure to covid-19: Secondary | ICD-10-CM | POA: Diagnosis not present

## 2020-02-18 LAB — BASIC METABOLIC PANEL
BUN: 26 mg/dL — ABNORMAL HIGH (ref 6–23)
CO2: 27 mEq/L (ref 19–32)
Calcium: 9.2 mg/dL (ref 8.4–10.5)
Chloride: 101 mEq/L (ref 96–112)
Creatinine, Ser: 1.25 mg/dL — ABNORMAL HIGH (ref 0.40–1.20)
GFR: 39.4 mL/min — ABNORMAL LOW (ref >60.00)
Glucose, Bld: 228 mg/dL — ABNORMAL HIGH (ref 70–99)
Potassium: 4.2 mEq/L (ref 3.5–5.1)
Sodium: 135 mEq/L (ref 135–145)

## 2020-02-19 ENCOUNTER — Telehealth: Payer: Self-pay | Admitting: Pharmacist

## 2020-02-19 LAB — SARS CORONAVIRUS 2 (TAT 6-24 HRS): SARS Coronavirus 2: NEGATIVE

## 2020-02-19 NOTE — Progress Notes (Addendum)
    Chronic Care Management Pharmacy Assistant   Name: Jessica Dennis  MRN: [id number] DOB: 1932/04/02  Reason for Encounter: Adherence Review  Verified Adherence Gap Information. Per insurance data, the patient is 100% compliant with the CHOL medication: Simvastatin 20 mg. Patient has met their wellness bundle screening but the patient did not met their annual wellness screening. Their most recent A1C was 6.3 on 04/28/19. Their most recent blood pressure was 98/60 on 11/09/19. The patient met goal by keeping their blood pressure below 140/90. The patients total gaps measures is equal to 1.  Follow-Up:  Pharmacist Review   Charlann Lange, [place] Pharmacist Assistant [phone]  2 minutes spent in review, coordination, and documentation.  Reviewed by: Beverly Milch, PharmD Clinical Pharmacist [facility] [phone]

## 2020-02-21 ENCOUNTER — Encounter (HOSPITAL_COMMUNITY): Payer: Self-pay | Admitting: Internal Medicine

## 2020-02-22 ENCOUNTER — Ambulatory Visit (HOSPITAL_COMMUNITY): Payer: HMO | Admitting: Anesthesiology

## 2020-02-22 ENCOUNTER — Ambulatory Visit (HOSPITAL_COMMUNITY): Payer: HMO

## 2020-02-22 ENCOUNTER — Ambulatory Visit (HOSPITAL_COMMUNITY)
Admission: RE | Admit: 2020-02-22 | Discharge: 2020-02-22 | Disposition: A | Discharge status: Home / Self Care | Payer: HMO | Attending: Internal Medicine | Admitting: Internal Medicine

## 2020-02-22 ENCOUNTER — Other Ambulatory Visit: Payer: Self-pay

## 2020-02-22 ENCOUNTER — Encounter (HOSPITAL_COMMUNITY)
Admission: RE | Disposition: A | Discharge status: Home / Self Care | Payer: Self-pay | Source: Home / Self Care | Attending: Internal Medicine

## 2020-02-22 ENCOUNTER — Encounter (HOSPITAL_COMMUNITY): Payer: Self-pay | Admitting: Internal Medicine

## 2020-02-22 DIAGNOSIS — K7031 Alcoholic cirrhosis of liver with ascites: Secondary | ICD-10-CM | POA: Diagnosis not present

## 2020-02-22 DIAGNOSIS — Z888 Allergy status to other drugs, medicaments and biological substances status: Secondary | ICD-10-CM | POA: Insufficient documentation

## 2020-02-22 DIAGNOSIS — Z96643 Presence of artificial hip joint, bilateral: Secondary | ICD-10-CM | POA: Insufficient documentation

## 2020-02-22 DIAGNOSIS — K862 Cyst of pancreas: Secondary | ICD-10-CM | POA: Insufficient documentation

## 2020-02-22 DIAGNOSIS — K31819 Angiodysplasia of stomach and duodenum without bleeding: Secondary | ICD-10-CM | POA: Diagnosis not present

## 2020-02-22 DIAGNOSIS — Z79899 Other long term (current) drug therapy: Secondary | ICD-10-CM | POA: Insufficient documentation

## 2020-02-22 DIAGNOSIS — Z79890 Hormone replacement therapy (postmenopausal): Secondary | ICD-10-CM | POA: Insufficient documentation

## 2020-02-22 DIAGNOSIS — Z794 Long term (current) use of insulin: Secondary | ICD-10-CM | POA: Diagnosis not present

## 2020-02-22 DIAGNOSIS — Z8052 Family history of malignant neoplasm of bladder: Secondary | ICD-10-CM | POA: Insufficient documentation

## 2020-02-22 DIAGNOSIS — D500 Iron deficiency anemia secondary to blood loss (chronic): Secondary | ICD-10-CM

## 2020-02-22 DIAGNOSIS — K766 Portal hypertension: Secondary | ICD-10-CM | POA: Diagnosis not present

## 2020-02-22 HISTORY — PX: ESOPHAGOGASTRODUODENOSCOPY (EGD) WITH PROPOFOL: SHX5813

## 2020-02-22 LAB — CBC
HCT: 26.6 % — ABNORMAL LOW (ref 36.0–46.0)
Hemoglobin: 8.5 g/dL — ABNORMAL LOW (ref 12.0–15.0)
MCH: 28.9 pg (ref 26.0–34.0)
MCHC: 32 g/dL (ref 30.0–36.0)
MCV: 90.5 fL (ref 80.0–100.0)
Platelets: 58 10*3/uL — ABNORMAL LOW (ref 150–400)
RBC: 2.94 MIL/uL — ABNORMAL LOW (ref 3.87–5.11)
RDW: 20.5 % — ABNORMAL HIGH (ref 11.5–15.5)
WBC: 3.3 10*3/uL — ABNORMAL LOW (ref 4.0–10.5)
nRBC: 0 % (ref 0.0–0.2)

## 2020-02-22 LAB — GLUCOSE, CAPILLARY: Glucose-Capillary: 156 mg/dL — ABNORMAL HIGH (ref 70–99)

## 2020-02-22 LAB — FERRITIN: Ferritin: 19 ng/mL (ref 11–307)

## 2020-02-22 SURGERY — ESOPHAGOGASTRODUODENOSCOPY (EGD) WITH PROPOFOL
Anesthesia: Monitor Anesthesia Care

## 2020-02-22 MED ORDER — LACTATED RINGERS IV SOLN
INTRAVENOUS | Status: DC
  Administered 2020-02-22: 1000 mL via INTRAVENOUS

## 2020-02-22 MED ORDER — SODIUM CHLORIDE 0.9 % IV SOLN: INTRAVENOUS | Status: DC

## 2020-02-22 MED ORDER — PROPOFOL 500 MG/50ML IV EMUL
INTRAVENOUS | Status: DC | PRN
  Administered 2020-02-22: 125 ug/kg/min via INTRAVENOUS

## 2020-02-22 MED ORDER — PROPOFOL 500 MG/50ML IV EMUL
INTRAVENOUS | Status: DC | PRN
  Administered 2020-02-22 (×2): 20 mg via INTRAVENOUS

## 2020-02-22 MED ORDER — LIDOCAINE 2% (20 MG/ML) 5 ML SYRINGE
INTRAMUSCULAR | Status: DC | PRN
  Administered 2020-02-22: 60 mg via INTRAVENOUS

## 2020-02-22 SURGICAL SUPPLY — 14 items

## 2020-02-22 NOTE — Transfer of Care (Signed)
Immediate Anesthesia Transfer of Care Note  Patient: Jessica Dennis  Procedure(s) Performed: Procedure(s): ESOPHAGOGASTRODUODENOSCOPY (EGD) WITH PROPOFOL (N/A)  Patient Location: PACU  Anesthesia Type:MAC  Level of Consciousness:  sedated, patient cooperative and responds to stimulation  Airway & Oxygen Therapy:Patient Spontanous Breathing and Patient connected to face mask oxgen  Post-op Assessment:  Report given to PACU RN and Post -op Vital signs reviewed and stable  Post vital signs:  Reviewed and stable  Last Vitals:  Vitals:   02/22/20 0807  BP: 117/61  Pulse: (!) 116  Resp: 19  Temp: 36.8 C  SpO2: 12%    Complications: No apparent anesthesia complications

## 2020-02-22 NOTE — Anesthesia Postprocedure Evaluation (Signed)
Anesthesia Post Note  Patient: Jessica Dennis  Procedure(s) Performed: ESOPHAGOGASTRODUODENOSCOPY (EGD) WITH PROPOFOL (N/A )     Patient location during evaluation: PACU Anesthesia Type: MAC Level of consciousness: awake and alert Pain management: pain level controlled Vital Signs Assessment: post-procedure vital signs reviewed and stable Respiratory status: spontaneous breathing, nonlabored ventilation, respiratory function stable and patient connected to nasal cannula oxygen Cardiovascular status: stable and blood pressure returned to baseline Postop Assessment: no apparent nausea or vomiting Anesthetic complications: no   No complications documented.  Last Vitals:  Vitals:   02/22/20 0910 02/22/20 0920  BP: (!) 117/59 (!) 109/54  Pulse: (!) 114 (!) 113  Resp: 17 18  Temp:    SpO2: 95% 97%    Last Pain:  Vitals:   02/22/20 0920  TempSrc:   PainSc: 0-No pain                 Saylor Murry

## 2020-02-22 NOTE — Discharge Instructions (Signed)
Everything looks like it did last time - 2019. I did not think the abnormal blood vessels needed treatment.  I am rechecking iron and blood count today.  Once I see those results will determine next steps.  I am thinking of changing from metoprolol to a different medication that would reduce chance of blood leaking from stomach.  I appreciate the opportunity to care for you. Gatha Mayer, MD, FACG  YOU HAD AN ENDOSCOPIC PROCEDURE TODAY: Refer to the procedure report and other information in the discharge instructions given to you for any specific questions about what was found during the examination. If this information does not answer your questions, please call Dr. Celesta Aver office at [phone] to clarify.   YOU SHOULD EXPECT: Some feelings of bloating in the abdomen. Passage of more gas than usual. Walking can help get rid of the air that was put into your GI tract during the procedure and reduce the bloating. If you had a lower endoscopy (such as a colonoscopy or flexible sigmoidoscopy) you may notice spotting of blood in your stool or on the toilet paper. Some abdominal soreness may be present for a day or two, also.  DIET: Your first meal following the procedure should be a light meal and then it is ok to progress to your normal diet. A half-sandwich or bowl of soup is an example of a good first meal. Heavy or fried foods are harder to digest and may make you feel nauseous or bloated. Drink plenty of fluids but you should avoid alcoholic beverages for 24 hours.   ACTIVITY: Your care partner should take you home directly after the procedure. You should plan to take it easy, moving slowly for the rest of the day. You can resume normal activity the day after the procedure however YOU SHOULD NOT DRIVE, use power tools, machinery or perform tasks that involve climbing or major physical exertion for 24 hours (because of the sedation medicines used during the test).   SYMPTOMS TO REPORT  IMMEDIATELY: A gastroenterologist can be reached at any hour. Please call [phone]  for any of the following symptoms:   Following upper endoscopy (EGD, EUS, ERCP, esophageal dilation) Vomiting of blood or coffee ground material  New, significant abdominal pain  New, significant chest pain or pain under the shoulder blades  Painful or persistently difficult swallowing  New shortness of breath  Black, tarry-looking or red, bloody stools

## 2020-02-22 NOTE — Interval H&P Note (Signed)
History and Physical Interval Note:  02/22/2020 8:24 AM  Jessica Dennis  has presented today for surgery, with the diagnosis of iron def anemia, cirrhosis.  The various methods of treatment have been discussed with the patient and family. After consideration of risks, benefits and other options for treatment, the patient has consented to  Procedure(s): ESOPHAGOGASTRODUODENOSCOPY (EGD) WITH PROPOFOL (N/A) as a surgical intervention.  The patient's history has been reviewed, patient examined, no change in status, stable for surgery.  I have reviewed the patient's chart and labs.  Questions were answered to the patient's satisfaction.     Silvano Rusk

## 2020-02-22 NOTE — Anesthesia Preprocedure Evaluation (Addendum)
Anesthesia Evaluation  Patient identified by MRN, date of birth, ID band Patient awake    Reviewed: Allergy & Precautions, H&P , NPO status , Patient's Chart, lab work & pertinent test results, reviewed documented beta blocker date and time   Airway Mallampati: II  TM Distance: >3 FB Neck ROM: full    Dental no notable dental hx.    Pulmonary shortness of breath,    Pulmonary exam normal breath sounds clear to auscultation       Cardiovascular Exercise Tolerance: Good hypertension, Pt. on medications and Pt. on home beta blockers +CHF   Rhythm:regular Rate:Normal  ECHO 11/25 1. Left ventricular ejection fraction, by estimation, is 60 to 65%. The  left ventricle has normal function. The left ventricle has no regional  wall motion abnormalities. Left ventricular diastolic parameters are  indeterminate.  2. Right ventricular systolic function is normal. The right ventricular  size is normal. There is normal pulmonary artery systolic pressure.  3. There is moderate posterior mitral annular calcification. No evidence  of mitral valve regurgitation. No evidence of mitral stenosis.  4. The aortic valve has an indeterminant number of cusps. Aortic valve  regurgitation is not visualized. No aortic stenosis is present.   Myo Perf 11/21 Nuclear stress EF: 72%. There was no ST segment deviation noted during stress. The study is normal. This is a low risk study. The left ventricular ejection fraction is hyperdynamic (>65%).      Neuro/Psych PSYCHIATRIC DISORDERS Anxiety  Neuromuscular disease    GI/Hepatic GERD  Medicated,(+) Cirrhosis       ,   Endo/Other  diabetes, Type 2, Insulin DependentHypothyroidism   Renal/GU Renal disease  negative genitourinary   Musculoskeletal  (+) Arthritis , Osteoarthritis,    Abdominal   Peds  Hematology  (+) Blood dyscrasia, anemia ,   Anesthesia Other Findings    Reproductive/Obstetrics negative OB ROS                             Anesthesia Physical Anesthesia Plan  ASA: III  Anesthesia Plan: MAC   Post-op Pain Management:    Induction: Intravenous  PONV Risk Score and Plan: 2  Airway Management Planned: Natural Airway and Nasal Cannula  Additional Equipment: None  Intra-op Plan:   Post-operative Plan:   Informed Consent: I have reviewed the patients History and Physical, chart, labs and discussed the procedure including the risks, benefits and alternatives for the proposed anesthesia with the patient or authorized representative who has indicated his/her understanding and acceptance.     Dental Advisory Given  Plan Discussed with: CRNA and Anesthesiologist  Anesthesia Plan Comments:         Anesthesia Quick Evaluation

## 2020-02-22 NOTE — Op Note (Signed)
[facility] Patient Name: Jessica Dennis Procedure Date: 02/22/2020 MRN: [id number] Attending MD: Gatha Mayer , MD Date of Birth: Jun 26, 1932 CSN: [id number] Age: 87 Admit Type: Outpatient Procedure:                Upper GI endoscopy Indications:              Iron deficiency anemia secondary to chronic blood                            loss, Cirrhosis rule out esophageal varices Providers:                Gatha Mayer, MD, Cleda Daub, RN, Tyna Jaksch Technician Referring MD:             Rudell Cobb. Marin Olp MD, MD, Rosalita Chessman Medicines:                Propofol per Anesthesia, Monitored Anesthesia Care Complications:            No immediate complications. Estimated Blood Loss:     Estimated blood loss: none. Procedure:                Pre-Anesthesia Assessment:                           - Prior to the procedure, a History and Physical                            was performed, and patient medications and                            allergies were reviewed. The patient's tolerance of                            previous anesthesia was also reviewed. The risks                            and benefits of the procedure and the sedation                            options and risks were discussed with the patient.                            All questions were answered, and informed consent                            was obtained. Prior Anticoagulants: The patient has                            taken no previous anticoagulant or antiplatelet                            agents. ASA Grade Assessment: III - A patient with  severe systemic disease. After reviewing the risks                            and benefits, the patient was deemed in                            satisfactory condition to undergo the procedure.                           After obtaining informed consent, the endoscope was                            passed  under direct vision. Throughout the                            procedure, the patient's blood pressure, pulse, and                            oxygen saturations were monitored continuously. The                            [id number]-[id number] Olympus gastroscope was                            introduced through the mouth, and advanced to the                            second part of duodenum. The upper GI endoscopy was                            accomplished without difficulty. The patient                            tolerated the procedure well. Scope In: Scope Out: Findings:      The examined esophagus was normal.      Moderate portal hypertensive gastropathy was found in the entire       examined stomach.      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      The exam was otherwise without abnormality.      The cardia and gastric fundus were normal on retroflexion. Impression:               - Normal esophagus.                           - Portal hypertensive gastropathy.-moderate                           - Gastric antral vascular ectasia without bleeding.                            Mild as in 2019 - I did not capture well on photos  today but can bve seen on the 2019 procedure note                           - The examination was otherwise normal.                           - No specimens collected. Moderate Sedation:      Not Applicable - Patient had care per Anesthesia. Recommendation:           - Patient has a contact number available for                            emergencies. The signs and symptoms of potential                            delayed complications were discussed with the                            patient. Return to normal activities tomorrow.                            Written discharge instructions were provided to the                            patient.                           -  Resume previous diet.                           - Continue present medications.                           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 given concomitant portal  hypertensive gastropathy Procedure Code(s):        --- Professional ---                           [phone], Esophagogastroduodenoscopy, flexible,                            transoral; diagnostic, including collection of                            specimen(s) by brushing or washing, when performed                            (separate procedure) Diagnosis Code(s):        --- Professional ---                           K76.6, Portal hypertension                           K31.89, Other diseases of stomach and duodenum                           K31.819, Angiodysplasia of stomach and duodenum                            without bleeding                           D50.0, Iron deficiency anemia secondary to blood                            loss (chronic)                           K74.60, Unspecified cirrhosis of liver CPT copyright 2019 [employer]. All rights reserved. The codes documented in this report are preliminary and upon coder review may  be revised to meet current compliance requirements. Gatha Mayer, MD 02/22/2020 9:06:15 AM This report has been signed electronically. Number of Addenda: 0

## 2020-02-22 NOTE — Anesthesia Procedure Notes (Signed)
Procedure Name: MAC Date/Time: 02/22/2020 8:43 AM Performed by: Lavina Hamman, CRNA Pre-anesthesia Checklist: Patient identified, Emergency Drugs available, Suction available, Patient being monitored and Timeout performed Patient Re-evaluated:Patient Re-evaluated prior to induction Oxygen Delivery Method: Simple face mask Preoxygenation: POM used.

## 2020-02-23 ENCOUNTER — Other Ambulatory Visit: Payer: Self-pay | Admitting: Internal Medicine

## 2020-02-23 ENCOUNTER — Ambulatory Visit (HOSPITAL_COMMUNITY)
Admission: RE | Admit: 2020-02-23 | Discharge: 2020-02-23 | Disposition: A | Discharge status: Home / Self Care | Payer: HMO | Source: Ambulatory Visit | Attending: Internal Medicine | Admitting: Internal Medicine

## 2020-02-23 ENCOUNTER — Telehealth: Payer: Self-pay | Admitting: Hematology & Oncology

## 2020-02-23 DIAGNOSIS — J90 Pleural effusion, not elsewhere classified: Secondary | ICD-10-CM | POA: Diagnosis present

## 2020-02-23 HISTORY — PX: IR THORACENTESIS ASP PLEURAL SPACE W/IMG GUIDE: IMG5380

## 2020-02-23 LAB — BODY FLUID CELL COUNT WITH DIFFERENTIAL
Eos, Fluid: 0 %
Lymphs, Fluid: 68 %
Monocyte-Macrophage-Serous Fluid: 22 % — ABNORMAL LOW (ref 50–90)
Neutrophil Count, Fluid: 10 % (ref 0–25)
Total Nucleated Cell Count, Fluid: 500 cu mm (ref 0–1000)

## 2020-02-23 LAB — LACTATE DEHYDROGENASE, PLEURAL OR PERITONEAL FLUID: LD, Fluid: 94 U/L — ABNORMAL HIGH (ref 3–23)

## 2020-02-23 LAB — PROTEIN, PLEURAL OR PERITONEAL FLUID: Total protein, fluid: <3 g/dL

## 2020-02-23 MED ORDER — LIDOCAINE HCL (PF) 1 % IJ SOLN
INTRAMUSCULAR | Status: DC | PRN
  Administered 2020-02-23: 30 mL

## 2020-02-23 MED ORDER — LIDOCAINE HCL 1 % IJ SOLN
INTRAMUSCULAR | Status: AC
  Filled 2020-02-23: qty 20

## 2020-02-23 NOTE — Procedures (Signed)
Right sided thoracentesis  1L of amber, clear fluid  Sent for labs per MD Tolerated well with minimal pain  Chest xray pending  Estimated blood loss less than 1 cc

## 2020-02-23 NOTE — Telephone Encounter (Signed)
Called and spoke with daughter in law [place] regarding appointment dates & times.  She was on with both per 1/31 sch msg

## 2020-02-25 LAB — PATHOLOGIST SMEAR REVIEW

## 2020-02-25 LAB — PH, BODY FLUID: pH, Body Fluid: 7.6

## 2020-02-29 ENCOUNTER — Inpatient Hospital Stay: Payer: HMO | Attending: Hematology & Oncology

## 2020-02-29 ENCOUNTER — Encounter: Payer: Self-pay | Admitting: Family Medicine

## 2020-02-29 ENCOUNTER — Other Ambulatory Visit: Payer: Self-pay

## 2020-02-29 VITALS — BP 106/62 | HR 99 | Temp 98.1°F | Resp 17

## 2020-02-29 DIAGNOSIS — K219 Gastro-esophageal reflux disease without esophagitis: Secondary | ICD-10-CM

## 2020-02-29 DIAGNOSIS — K5790 Diverticulosis of intestine, part unspecified, without perforation or abscess without bleeding: Secondary | ICD-10-CM | POA: Insufficient documentation

## 2020-02-29 DIAGNOSIS — K7460 Unspecified cirrhosis of liver: Secondary | ICD-10-CM | POA: Insufficient documentation

## 2020-02-29 DIAGNOSIS — R195 Other fecal abnormalities: Secondary | ICD-10-CM

## 2020-02-29 DIAGNOSIS — G8929 Other chronic pain: Secondary | ICD-10-CM | POA: Insufficient documentation

## 2020-02-29 DIAGNOSIS — D500 Iron deficiency anemia secondary to blood loss (chronic): Secondary | ICD-10-CM

## 2020-02-29 DIAGNOSIS — E119 Type 2 diabetes mellitus without complications: Secondary | ICD-10-CM | POA: Insufficient documentation

## 2020-02-29 DIAGNOSIS — K5520 Angiodysplasia of colon without hemorrhage: Secondary | ICD-10-CM

## 2020-02-29 DIAGNOSIS — D649 Anemia, unspecified: Secondary | ICD-10-CM | POA: Insufficient documentation

## 2020-02-29 DIAGNOSIS — D6959 Other secondary thrombocytopenia: Secondary | ICD-10-CM | POA: Insufficient documentation

## 2020-02-29 DIAGNOSIS — R7303 Prediabetes: Secondary | ICD-10-CM | POA: Insufficient documentation

## 2020-02-29 DIAGNOSIS — I10 Essential (primary) hypertension: Secondary | ICD-10-CM | POA: Insufficient documentation

## 2020-02-29 DIAGNOSIS — E7800 Pure hypercholesterolemia, unspecified: Secondary | ICD-10-CM | POA: Insufficient documentation

## 2020-02-29 DIAGNOSIS — E1140 Type 2 diabetes mellitus with diabetic neuropathy, unspecified: Secondary | ICD-10-CM | POA: Insufficient documentation

## 2020-02-29 DIAGNOSIS — M1990 Unspecified osteoarthritis, unspecified site: Secondary | ICD-10-CM | POA: Insufficient documentation

## 2020-02-29 DIAGNOSIS — E079 Disorder of thyroid, unspecified: Secondary | ICD-10-CM | POA: Insufficient documentation

## 2020-02-29 DIAGNOSIS — M545 Low back pain, unspecified: Secondary | ICD-10-CM | POA: Insufficient documentation

## 2020-02-29 MED ORDER — SODIUM CHLORIDE 0.9 % IV SOLN
200.0000 mg | Freq: Once | INTRAVENOUS | Status: AC
  Administered 2020-02-29: 200 mg via INTRAVENOUS
  Filled 2020-02-29: qty 10

## 2020-02-29 MED ORDER — SODIUM CHLORIDE 0.9 % IV SOLN
INTRAVENOUS | Status: DC
  Filled 2020-02-29: qty 250

## 2020-02-29 MED ORDER — SODIUM CHLORIDE 0.9 % IV SOLN
200.0000 mg | Freq: Once | INTRAVENOUS | Status: DC
  Filled 2020-02-29: qty 10

## 2020-02-29 NOTE — Patient Instructions (Signed)

## 2020-03-01 ENCOUNTER — Ambulatory Visit (INDEPENDENT_AMBULATORY_CARE_PROVIDER_SITE_OTHER): Payer: HMO | Admitting: Pulmonary Disease

## 2020-03-01 ENCOUNTER — Encounter: Payer: Self-pay | Admitting: Pulmonary Disease

## 2020-03-01 ENCOUNTER — Ambulatory Visit (INDEPENDENT_AMBULATORY_CARE_PROVIDER_SITE_OTHER): Payer: HMO | Admitting: Cardiology

## 2020-03-01 ENCOUNTER — Encounter: Payer: Self-pay | Admitting: Cardiology

## 2020-03-01 VITALS — BP 122/74 | HR 98 | Temp 97.1°F | Ht 67.0 in | Wt 179.0 lb

## 2020-03-01 VITALS — BP 118/60 | HR 115 | Ht 67.0 in | Wt 178.1 lb

## 2020-03-01 DIAGNOSIS — J90 Pleural effusion, not elsewhere classified: Secondary | ICD-10-CM

## 2020-03-01 DIAGNOSIS — E1165 Type 2 diabetes mellitus with hyperglycemia: Secondary | ICD-10-CM | POA: Diagnosis not present

## 2020-03-01 DIAGNOSIS — I10 Essential (primary) hypertension: Secondary | ICD-10-CM

## 2020-03-01 DIAGNOSIS — I4729 Other ventricular tachycardia: Secondary | ICD-10-CM

## 2020-03-01 DIAGNOSIS — I472 Ventricular tachycardia: Secondary | ICD-10-CM | POA: Diagnosis not present

## 2020-03-01 DIAGNOSIS — Z794 Long term (current) use of insulin: Secondary | ICD-10-CM

## 2020-03-01 MED ORDER — FUROSEMIDE 20 MG PO TABS: ORAL_TABLET | ORAL | 3 refills | Status: DC

## 2020-03-01 NOTE — Progress Notes (Signed)
Cardiology Office Note:    Date:  03/01/2020   ID:  Jessica Dennis, DOB [birth year], MRN [id number]  PCP:  Carollee Herter, Alferd Apa, DO  Cardiologist:  Berniece Salines, DO  Electrophysiologist:  None   Referring MD: Carollee Herter, Alferd Apa, *   " I am doing well- recently"   History of Present Illness:    Jessica Dennis is a 87 y.o. female with a hx of  of alcoholic liver cirrhosis, diabetes mellitus, hypertension, portal hypertensive gastropathy presents today for a follow up visit.   I saw the patient in November 2021 at that time she was here with her daughter-in-law I added Aldactone to her regimen continue her Lopressor as well as her Lasix.  Also placed a monitor on the patient.  In the interim we will talk to the patient about her monitor and her nuclear stress test results. Since I last saw the patient she has had IR guided pleurocentesis twice.  Past Medical History:  Diagnosis Date  . Anemia   . Angiodysplasia of ascending colon 10/25/2014  . Anxiety   . Arthritis   . Borderline diabetes   . Cancer of the skin, basal cell 09/03/2012  . Cirrhosis ([place])   . Diabetes mellitus without complication ([place])   . Diverticular disease   . 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 GERD (gastroesophageal reflux disease)   . Hypertension   . Iron deficiency anemia due to chronic blood loss 01/19/2015  . Portal hypertensive gastropathy ([place]) 08/02/2016   ? Some GAVE also  . Thyroid disease     Past Surgical History:  Procedure Laterality Date  . ABDOMINAL HYSTERECTOMY    . APPENDECTOMY    . COLONOSCOPY    . ESOPHAGOGASTRODUODENOSCOPY    . ESOPHAGOGASTRODUODENOSCOPY (EGD) WITH PROPOFOL N/A 02/22/2020   Procedure: ESOPHAGOGASTRODUODENOSCOPY (EGD) WITH PROPOFOL;  Surgeon: Gatha Mayer, MD;  Location: [facility];  Service: Endoscopy;  Laterality: N/A;  . IR PARACENTESIS  05/25/2016  . IR THORACENTESIS ASP PLEURAL SPACE W/IMG GUIDE  02/23/2020  . LEG SKIN LESION  BIOPSY /  EXCISION  12/11/14  . MOHS SURGERY     ankle  . TONSILLECTOMY    . TOTAL HIP ARTHROPLASTY Bilateral 1993, 2006  . UPPER GASTROINTESTINAL ENDOSCOPY  09/09/2017    Current Medications: Current Meds  Medication Sig  . Accu-Chek FastClix Lancets MISC CHECK FOR BLOOD SUGAR DAILY  . Ascorbic Acid (VITAMIN C) 1000 MG tablet Take 1,000 mg by mouth at bedtime.  Marland Kitchen CALCIUM CARBONATE-VITAMIN D PO Take 2 tablets by mouth at bedtime. Calcium 1200mg   Vitamin D 5000 units  . folic acid (FOLVITE) 1 MG tablet Take 1 tablet (1 mg total) by mouth daily.  . furosemide (LASIX) 20 MG tablet Take one by mouth at night Monday, Wednesday and Friday.  . furosemide (LASIX) 40 MG tablet Take 1 tablet (40 mg total) by mouth in the morning.  . Insulin Aspart Prot & Aspart (NOVOLOG MIX 70/30 [place]) Inject 25-35 Units into the skin See admin instructions. Inject 35 units subcutaneously in the mornings and inject 25 units subcutaneously at night  . lactulose (CHRONULAC) 10 GM/15ML solution Take 30 mLs (20 g total) by mouth 2 (two) times daily as needed for mild constipation. (Patient taking differently: Take 20 g by mouth 2 (two) times daily as needed (mild confusion (increased ammonia levels)).)  . latanoprost (XALATAN) 0.005 % ophthalmic solution Place 1 drop into both eyes at bedtime.   Marland Kitchen levothyroxine (SYNTHROID) 75 MCG tablet  Take 1 tablet (75 mcg total) by mouth daily.  Marland Kitchen LORazepam (ATIVAN) 0.5 MG tablet Take 0.5 mg by mouth at bedtime.  . metFORMIN (GLUCOPHAGE-XR) 500 MG 24 hr tablet Take 1 tablet (500 mg total) by mouth daily.  . metoprolol succinate (TOPROL-XL) 25 MG 24 hr tablet TAKE 0.5 TABLETS BY MOUTH DAILY (Patient taking differently: Take 12.5 mg by mouth daily.)  . pantoprazole (PROTONIX) 40 MG tablet Take 1 tablet (40 mg total) by mouth daily. (Patient taking differently: Take 40 mg by mouth at bedtime.)  . simvastatin (ZOCOR) 20 MG tablet TAKE ONE TABLET BY MOUTH EVERY NIGHT AT BEDTIME (Patient taking  differently: Take 20 mg by mouth at bedtime.)  . spironolactone (ALDACTONE) 50 MG tablet Take 1 tablet (50 mg total) by mouth daily.  . vitamin E 180 MG (400 UNITS) capsule Take 400 Units by mouth every evening.     Allergies:   Tizanidine   Social History   Socioeconomic History  . Marital status: Divorced    Spouse name: Not on file  . Number of children: 2  . Years of education: Not on file  . Highest education level: Not on file  Occupational History  . Occupation: retired  Tobacco Use  . Smoking status: Never Smoker  . Smokeless tobacco: Never Used  Vaping Use  . Vaping Use: Never used  Substance and Sexual Activity  . Alcohol use: Yes    Comment: weekly  . Drug use: No  . Sexual activity: Not on file  Other Topics Concern  . Not on file  Social History Narrative   The patient is divorced. She is originally from [place]. She has 2 sons. 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   08/10/2014   Social Determinants of Health   Financial Resource Strain: Not on file  Food Insecurity: Not on file  Transportation Needs: Not on file  Physical Activity: Not on file  Stress: Not on file  Social Connections: Not on file     Family History: The patient's family history includes Bladder Cancer in her father; Diabetes in her mother; Heart attack in her father. There is no history of Colon cancer, Colon polyps, Esophageal cancer, Rectal cancer, or Stomach cancer.  ROS:   Review of Systems  Constitution: Negative for decreased appetite, fever and weight gain.  HENT: Negative for congestion, ear discharge, hoarse voice and sore throat.   Eyes: Negative for discharge, redness, vision loss in right eye and visual halos.  Cardiovascular: Negative for chest pain, dyspnea on exertion, leg swelling, orthopnea and palpitations.  Respiratory: Negative for cough, hemoptysis, shortness of breath and snoring.   Endocrine: Negative for heat intolerance and  polyphagia.  Hematologic/Lymphatic: Negative for bleeding problem. Does not bruise/bleed easily.  Skin: Negative for flushing, nail changes, rash and suspicious lesions.  Musculoskeletal: Negative for arthritis, joint pain, muscle cramps, myalgias, neck pain and stiffness.  Gastrointestinal: Negative for abdominal pain, bowel incontinence, diarrhea and excessive appetite.  Genitourinary: Negative for decreased libido, genital sores and incomplete emptying.  Neurological: Negative for brief paralysis, focal weakness, headaches and loss of balance.  Psychiatric/Behavioral: Negative for altered mental status, depression and suicidal ideas.  Allergic/Immunologic: Negative for HIV exposure and persistent infections.    EKGs/Labs/Other Studies Reviewed:    The following studies were reviewed today:   EKG: None today  Zio monitor The patient wore the monitor for 8 days 4 hours starting 11/27/2019. Indication: Atrial flutter  The minimum heart rate was  58 bpm, maximum heart rate was 193 bpm, and average heart rate was 94 bpm. Predominant underlying rhythm was Sinus Rhythm.  5 Ventricular Tachycardia runs occurred, the run with the fastest interval lasting 4 beats with a max rate of 193bpm, the longest lasting 5 beats with an average rate of 170 bpm.   2217 Supraventricular Tachycardia runs occurred, the run with the fastest interval lasting 5 beats with a maximum rate of 182 bpm, the longest lasting 22 mins 4 secs with an average rate of 112 bpm.  Premature atrial complexes were frequent (6.4%, 65536). Premature Ventricular complexes were rare.  No pauses, No AV block.  Patient triggered events or diary event noted.  Conclusion: This study is remarkable for the following:                                                               1.  Nonsustained ventricular tachycardia                                           2.  Supraventricular tachycardia mostly paroxysmal atrial  tachycardia however some beats appears irregular with wandering baseline cannot rule atrial fibrillation  Pharmacologic nuclear stress test November 2021  Nuclear stress EF: 72%.  There was no ST segment deviation noted during stress.  The study is normal.  This is a low risk study.  The left ventricular ejection fraction is hyperdynamic (>65%).     TTE Study November 2021 1. Left ventricular ejection fraction, by estimation, is 60 to 65%. The  left ventricle has normal function. The left ventricle has no regional  wall motion abnormalities. Left ventricular diastolic parameters are  indeterminate.  2. Right ventricular systolic function is normal. The right ventricular  size is normal. There is normal pulmonary artery systolic pressure.  3. There is moderate posterior mitral annular calcification. No evidence  of mitral valve regurgitation. No evidence of mitral stenosis.  4. The aortic valve has an indeterminant number of cusps. Aortic valve  regurgitation is not visualized. No aortic stenosis is present.  5. The inferior vena cava is normal in size with greater than 50%  respiratory variability, suggesting right atrial pressure of 3 mmHg.   Comparison(s): No significant change from prior study.   Recent Labs: 11/09/2019: TSH 2.65 01/12/2020: Pro B Natriuretic peptide (BNP) 195.0 01/13/2020: B Natriuretic Peptide 199.4; Magnesium 2.2 02/01/2020: ALT 21 02/18/2020: BUN 26; Creatinine, Ser 1.25; Potassium 4.2; Sodium 135 02/22/2020: Hemoglobin 8.5; Platelets 58  Recent Lipid Panel    Component Value Date/Time   CHOL 112 11/09/2019 1137   TRIG 61 11/09/2019 1137   HDL 46 (L) 11/09/2019 1137   CHOLHDL 2.4 11/09/2019 1137   VLDL 14.2 04/28/2019 1139   LDLCALC 52 11/09/2019 1137    Physical Exam:    VS:  BP 118/60   Pulse (!) 115   Ht 5\' 7"  (1.702 m)   Wt 178 lb 1.6 oz (80.8 kg)   SpO2 95%   BMI 27.89 kg/m     Wt Readings from Last 3 Encounters:  03/01/20  178 lb 1.6 oz (80.8 kg)  03/01/20 179 lb (81.2 kg)  02/22/20 181 lb (82.1  kg)     GEN: Well nourished, well developed in no acute distress HEENT: Normal NECK: No JVD; No carotid bruits LYMPHATICS: No lymphadenopathy CARDIAC: S1S2 noted,RRR, no murmurs, rubs, gallops RESPIRATORY:  Clear to auscultation without rales, wheezing or rhonchi  ABDOMEN: Soft, non-tender, non-distended, +bowel sounds, no guarding. EXTREMITIES: Bilateral leg edema +2 edema, No cyanosis, no clubbing MUSCULOSKELETAL:  No deformity  SKIN: Warm and dry NEUROLOGIC:  Alert and oriented x 3, non-focal PSYCHIATRIC:  Normal affect, good insight  ASSESSMENT:    1. NSVT (nonsustained ventricular tachycardia) (HCC)    PLAN:     1.  She is got significant bilateral leg edema we will like to do is increase her Lasix from 40 mg daily to adding 20 mg on Monday Wednesday and Fridays in the evening.  She will remain on her current dose of Aldactone and other medications.  2.  No palpitations she is doing well on the antifactor she tells me.  No other complaints at this time.  3.  Her blood pressure is acceptable we will continue to monitor this  4.  She has been followed up by pulmonary and IR for her recurrent pleural effusion.  5.  This is being managed by his primary care doctor.  No adjustments for antidiabetic medications were made today.    The patient is in agreement with the above plan. The patient left the office in stable condition.  The patient will follow up in   Medication Adjustments/Labs and Tests Ordered: Current medicines are reviewed at length with the patient today.  Concerns regarding medicines are outlined above.  Orders Placed This Encounter  Procedures  . Basic metabolic panel  . Magnesium   Meds ordered this encounter  Medications  . furosemide (LASIX) 20 MG tablet    Sig: Take one by mouth at night Monday, Wednesday and Friday.    Dispense:  90 tablet    Refill:  3    Patient  Instructions  Medication Instructions:  Your physician has recommended you make the following change in your medication:  INCREASE: Lasix total 60 mg Monday, Wednesday and Friday  *If you need a refill on your cardiac medications before your next appointment, please call your pharmacy*   Lab Work: Your physician recommends that you return for lab work: [place], [place]  If you have labs (blood work) drawn today and your tests are completely normal, you will receive your results only by: Marland Kitchen MyChart Message (if you have MyChart) OR . A paper copy in the mail If you have any lab test that is abnormal or we need to change your treatment, we will call you to review the results.   Testing/Procedures: None   Follow-Up: At [facility], you and your health needs are our priority.  As part of our continuing mission to provide you with exceptional heart care, we have created designated Provider Care Teams.  These Care Teams include your primary Cardiologist (physician) and Advanced Practice Providers (APPs -  Physician Assistants and Nurse Practitioners) who all work together to provide you with the care you need, when you need it.  We recommend signing up for the patient portal called "MyChart".  Sign up information is provided on this After Visit Summary.  MyChart is used to connect with patients for Virtual Visits (Telemedicine).  Patients are able to view lab/test results, encounter notes, upcoming appointments, etc.  Non-urgent messages can be sent to your provider as well.   To learn more about what  you can do with MyChart, go to [email or web address].    Your next appointment:   6 month(s)  The format for your next appointment:   In Person  Provider:   Berniece Salines, DO   Other Instructions      Adopting a Healthy Lifestyle.  Know what a healthy weight is for you (roughly BMI <25) and aim to maintain this   Aim for 7+ servings of fruits and vegetables daily   65-80+  fluid ounces of water or unsweet tea for healthy kidneys   Limit to max 1 drink of alcohol per day; avoid smoking/tobacco   Limit animal fats in diet for cholesterol and heart health - choose grass fed whenever available   Avoid highly processed foods, and foods high in saturated/trans fats   Aim for low stress - take time to unwind and care for your mental health   Aim for 150 min of moderate intensity exercise weekly for heart health, and weights twice weekly for bone health   Aim for 7-9 hours of sleep daily   When it comes to diets, agreement about the perfect plan isnt easy to find, even among the experts. Experts at the [employer] developed an idea known as the Healthy Eating Plate. Just imagine a plate divided into logical, healthy portions.   The emphasis is on diet quality:   Load up on vegetables and fruits - one-half of your plate: Aim for color and variety, and remember that potatoes dont count.   Go for whole grains - one-quarter of your plate: Whole wheat, barley, wheat berries, quinoa, oats, brown rice, and foods made with them. If you want pasta, go with whole wheat pasta.   Protein power - one-quarter of your plate: Fish, chicken, beans, and nuts are all healthy, versatile protein sources. Limit red meat.   The diet, however, does go beyond the plate, offering a few other suggestions.   Use healthy plant oils, such as olive, canola, soy, corn, sunflower and peanut. Check the labels, and avoid partially hydrogenated oil, which have unhealthy trans fats.   If youre thirsty, drink water. Coffee and tea are good in moderation, but skip sugary drinks and limit milk and dairy products to one or two daily servings.   The type of carbohydrate in the diet is more important than the amount. Some sources of carbohydrates, such as vegetables, fruits, whole grains, and beans-are healthier than others.   Finally, stay active  Signed, Berniece Salines, DO   03/01/2020 1:42 PM    [facility]

## 2020-03-01 NOTE — Telephone Encounter (Signed)
Ok to send order to d/c accu checks

## 2020-03-01 NOTE — Patient Instructions (Signed)
Continue spironolactone and lasix as this will help prevent fluid build up around the right lung. I will talk with your primary care doctor and GI doctor about increasing the dose of spironolactone.   Please stop drinking alcohol completely as this will lead to further complications to your health and increase the risk for hospitalizations in the future.

## 2020-03-01 NOTE — Telephone Encounter (Signed)
Jessica Dennis attempted to fax orders but received two failed faxes. We will call again to verify the fax number

## 2020-03-01 NOTE — Patient Instructions (Signed)
Medication Instructions:  Your physician has recommended you make the following change in your medication:  INCREASE: Lasix total 60 mg Monday, Wednesday and Friday  *If you need a refill on your cardiac medications before your next appointment, please call your pharmacy*   Lab Work: Your physician recommends that you return for lab work: [place], [place]  If you have labs (blood work) drawn today and your tests are completely normal, you will receive your results only by: Marland Kitchen MyChart Message (if you have MyChart) OR . A paper copy in the mail If you have any lab test that is abnormal or we need to change your treatment, we will call you to review the results.   Testing/Procedures: None   Follow-Up: At [facility], you and your health needs are our priority.  As part of our continuing mission to provide you with exceptional heart care, we have created designated Provider Care Teams.  These Care Teams include your primary Cardiologist (physician) and Advanced Practice Providers (APPs -  Physician Assistants and Nurse Practitioners) who all work together to provide you with the care you need, when you need it.  We recommend signing up for the patient portal called "MyChart".  Sign up information is provided on this After Visit Summary.  MyChart is used to connect with patients for Virtual Visits (Telemedicine).  Patients are able to view lab/test results, encounter notes, upcoming appointments, etc.  Non-urgent messages can be sent to your provider as well.   To learn more about what you can do with MyChart, go to [email or web address].    Your next appointment:   6 month(s)  The format for your next appointment:   In Person  Provider:   Berniece Salines, DO   Other Instructions

## 2020-03-01 NOTE — Progress Notes (Signed)
Synopsis: Referred in January 2022 by Roma Schanz, DO for pleural effusion.  Subjective:   PATIENT ID: Jessica Dennis GENDER: female DOB: January 15, 1933, MRN: [id number]   HPI  Chief Complaint  Patient presents with  . Consult    Referred by GI for pleural effusion on left side. Had 1L of fluid removed about a week ago. Denies any SOB, chest pain or coughing.    Jessica Dennis is an 86 year old woman, never smoker with alcoholic liver cirrhosis who is referred to pulmonary clinic for right pleural effusion.   She was admitted 01/12/20 with shortness of breath to [facility]. She was noted to have a large right pleural effusion and transferred to [facility] where she underwent thoracentesis by IR. She had 2L of slightly hazy yellow fluid removed. Pleural studies showed an LDH of 47, total protein less than 3. Cell count showed lymphocyte predominance. Cytology was negative for malignancy.   She had a repeat thoracentesis performed on 02/23/20 with pleural studies showing an LDH of 94, total protein < 3, and a lymphocyte predominance on cell count/differential. No cytology was sent on this specimen.       She is on lasix 40mg  daily and 50mg  spironolactone daily. She currently denies issues with shortness of breath.   Chest radiograph post-thoracentesis does not indicate any nodules or masses in the clear lung fields bilaterally.   She continues to have 2-3 alcoholic drinks once a week with a meal with her kids.   EGD on 02/22/20 showed moderate portal gastropathy.   Past Medical History:  Diagnosis Date  . Anemia   . Angiodysplasia of ascending colon 10/25/2014  . Anxiety   . Arthritis   . Borderline diabetes   . Cancer of the skin, basal cell 09/03/2012  . Cirrhosis ([place])   . Diabetes mellitus without complication ([place])   . Diverticular disease   . 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 GERD (gastroesophageal reflux disease)   . Hypertension    . Iron deficiency anemia due to chronic blood loss 01/19/2015  . Portal hypertensive gastropathy ([place]) 08/02/2016   ? Some GAVE also  . Thyroid disease      Family History  Problem Relation Age of Onset  . Bladder Cancer Father   . Heart attack Father   . Diabetes Mother   . Colon cancer Neg Hx   . Colon polyps Neg Hx   . Esophageal cancer Neg Hx   . Rectal cancer Neg Hx   . Stomach cancer Neg Hx      Social History   Socioeconomic History  . Marital status: Divorced    Spouse name: Not on file  . Number of children: 2  . Years of education: Not on file  . Highest education level: Not on file  Occupational History  . Occupation: retired  Tobacco Use  . Smoking status: Never Smoker  . Smokeless tobacco: Never Used  Vaping Use  . Vaping Use: Never used  Substance and Sexual Activity  . Alcohol use: Yes    Comment: weekly  . Drug use: No  . Sexual activity: Not on file  Other Topics Concern  . Not on file  Social History Narrative   The patient is divorced. She is originally from [place]. She has 2 sons. 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   08/10/2014   Social Determinants of Health   Financial Resource Strain: Not on file  Food Insecurity: Not on file  Transportation Needs: Not on file  Physical Activity: Not on file  Stress: Not on file  Social Connections: Not on file  Intimate Partner Violence: Not on file     Allergies  Allergen Reactions  . Tizanidine Other (See Comments)    Hallucinate, confused      Outpatient Medications Prior to Visit  Medication Sig Dispense Refill  . Accu-Chek FastClix Lancets MISC CHECK FOR BLOOD SUGAR DAILY 102 each 5  . Ascorbic Acid (VITAMIN C) 1000 MG tablet Take 1,000 mg by mouth at bedtime.    Marland Kitchen CALCIUM CARBONATE-VITAMIN D PO Take 2 tablets by mouth at bedtime. Calcium 1200mg   Vitamin D 5000 units    . folic acid (FOLVITE) 1 MG tablet Take 1 tablet (1 mg total) by mouth daily.     . furosemide (LASIX) 40 MG tablet Take 1 tablet (40 mg total) by mouth in the morning. 30 tablet 1  . Insulin Aspart Prot & Aspart (NOVOLOG MIX 70/30 [place]) Inject 25-35 Units into the skin See admin instructions. Inject 35 units subcutaneously in the mornings and inject 25 units subcutaneously at night    . lactulose (CHRONULAC) 10 GM/15ML solution Take 30 mLs (20 g total) by mouth 2 (two) times daily as needed for mild constipation. (Patient taking differently: Take 20 g by mouth 2 (two) times daily as needed (mild confusion (increased ammonia levels)).) 236 mL 2  . latanoprost (XALATAN) 0.005 % ophthalmic solution Place 1 drop into both eyes at bedtime.     Marland Kitchen levothyroxine (SYNTHROID) 75 MCG tablet Take 1 tablet (75 mcg total) by mouth daily. 90 tablet 1  . LORazepam (ATIVAN) 0.5 MG tablet Take 0.5 mg by mouth at bedtime.    . metFORMIN (GLUCOPHAGE-XR) 500 MG 24 hr tablet Take 1 tablet (500 mg total) by mouth daily. 180 tablet 1  . metoprolol succinate (TOPROL-XL) 25 MG 24 hr tablet TAKE 0.5 TABLETS BY MOUTH DAILY (Patient taking differently: Take 12.5 mg by mouth daily.) 30 tablet 0  . pantoprazole (PROTONIX) 40 MG tablet Take 1 tablet (40 mg total) by mouth daily. (Patient taking differently: Take 40 mg by mouth at bedtime.) 90 tablet 3  . simvastatin (ZOCOR) 20 MG tablet TAKE ONE TABLET BY MOUTH EVERY NIGHT AT BEDTIME (Patient taking differently: Take 20 mg by mouth at bedtime.) 90 tablet 0  . spironolactone (ALDACTONE) 50 MG tablet Take 1 tablet (50 mg total) by mouth daily. 30 tablet 1  . vitamin E 180 MG (400 UNITS) capsule Take 400 Units by mouth every evening.     No facility-administered medications prior to visit.    Review of Systems  Constitutional: Negative for chills, fever, malaise/fatigue and weight loss.  HENT: Negative for congestion, sinus pain and sore throat.   Eyes: Negative.   Respiratory: Negative for cough, hemoptysis, sputum production, shortness of breath and  wheezing.   Cardiovascular: Negative for chest pain, palpitations, orthopnea, claudication and leg swelling.  Gastrointestinal: Negative for abdominal pain, heartburn, nausea and vomiting.  Genitourinary: Negative.   Musculoskeletal: Negative for joint pain and myalgias.  Skin: Negative for rash.  Neurological: Negative for weakness.  Endo/Heme/Allergies: Negative.   Psychiatric/Behavioral: Negative.     Objective:   Vitals:   03/01/20 0952  BP: 122/74  Pulse: 98  Temp: (!) 97.1 F (36.2 C)  TempSrc: Temporal  SpO2: 98%  Weight: 179 lb (81.2 kg)  Height: 5\' 7"  (1.702 m)     Physical Exam Constitutional:  General: She is not in acute distress.    Appearance: She is not ill-appearing.  HENT:     Head: Normocephalic and atraumatic.  Eyes:     General: No scleral icterus.    Conjunctiva/sclera: Conjunctivae normal.     Pupils: Pupils are equal, round, and reactive to light.  Cardiovascular:     Rate and Rhythm: Normal rate and regular rhythm.     Pulses: Normal pulses.     Heart sounds: Normal heart sounds. No murmur heard.   Pulmonary:     Effort: Pulmonary effort is normal.     Breath sounds: Rales (right base) present. No wheezing or rhonchi.  Abdominal:     General: Bowel sounds are normal.     Palpations: Abdomen is soft.  Musculoskeletal:     Right lower leg: No edema.     Left lower leg: No edema.  Lymphadenopathy:     Cervical: No cervical adenopathy.  Skin:    General: Skin is warm and dry.  Neurological:     General: No focal deficit present.     Mental Status: She is alert.  Psychiatric:        Mood and Affect: Mood normal.        Behavior: Behavior normal.        Thought Content: Thought content normal.        Judgment: Judgment normal.     CBC    Component Value Date/Time   WBC 3.3 (L) 02/22/2020 0930   RBC 2.94 (L) 02/22/2020 0930   HGB 8.5 (L) 02/22/2020 0930   HGB 9.2 (L) 01/21/2020 1137   HGB 11.5 (L) 12/18/2016 1314   HCT  26.6 (L) 02/22/2020 0930   HCT 34.8 12/18/2016 1314   PLT 58 (L) 02/22/2020 0930   PLT 53 (L) 01/21/2020 1137   PLT 73 Platelet count consistent in citrate (L) 12/18/2016 1314   MCV 90.5 02/22/2020 0930   MCV 87 12/18/2016 1314   MCH 28.9 02/22/2020 0930   MCHC 32.0 02/22/2020 0930   RDW 20.5 (H) 02/22/2020 0930   RDW 19.6 (H) 12/18/2016 1314   LYMPHSABS 0.7 02/01/2020 1446   LYMPHSABS 0.6 (L) 12/18/2016 1314   MONOABS 0.4 02/01/2020 1446   EOSABS 0.1 02/01/2020 1446   EOSABS 0.2 12/18/2016 1314   BASOSABS 0.0 02/01/2020 1446   BASOSABS 0.1 12/18/2016 1314   BMP Latest Ref Rng & Units 02/18/2020 02/09/2020 02/01/2020  Glucose 70 - 99 mg/dL 228(H) 247(H) 174(H)  BUN 6 - 23 mg/dL 26(H) 22 15  Creatinine 0.40 - 1.20 mg/dL 1.25(H) 1.36(H) 1.01  BUN/Creat Ratio 12 - 28 - - -  Sodium 135 - 145 mEq/L 135 139 138  Potassium 3.5 - 5.1 mEq/L 4.2 3.8 3.9  Chloride 96 - 112 mEq/L 101 103 105  CO2 19 - 32 mEq/L 27 30 29   Calcium 8.4 - 10.5 mg/dL 9.2 9.8 9.8   Chest imaging: CXR 02/23/20 Mediastinum and hilar structures normal. Heart size normal. Mild bibasilar subsegmental atelectasis. Near complete resolution of right pleural effusion. No pneumothorax. Mild thoracic spine Scoliosis.  CTA Chest 01/12/20 Mediastinum/Nodes: No enlarged mediastinal, hilar, or axillary lymph nodes. Thyroid gland, trachea, and esophagus demonstrate no significant findings.  Lungs/Pleura: Partial collapse of the right lower lobe. Passive atelectasis of the right upper lobe. Left base atelectasis. Right large right pleural effusion. No left pleural effusion. No Pneumothorax.  PFT: No flowsheet data found.  Echo: 11/27/19 1. Left ventricular ejection fraction, by estimation, is 60 to  65%. The  left ventricle has normal function. The left ventricle has no regional  wall motion abnormalities. Left ventricular diastolic parameters are  indeterminate.  2. Right ventricular systolic function is normal. The  right ventricular  size is normal. There is normal pulmonary artery systolic pressure.  3. There is moderate posterior mitral annular calcification. No evidence  of mitral valve regurgitation. No evidence of mitral stenosis.  4. The aortic valve has an indeterminant number of cusps. Aortic valve  regurgitation is not visualized. No aortic stenosis is present.  5. The inferior vena cava is normal in size with greater than 50%  respiratory variability, suggesting right atrial pressure of 3 mmHg.    Assessment & Plan:   Pleural effusion on right  Discussion: Jessica Dennis is an 87 year old woman, never smoker with alcoholic liver cirrhosis who is referred to pulmonary clinic for right pleural effusion.  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   She is currently on lasix 40mg  daily and spironolactone 50mg  daily. I would recommend increasing her diuretic therapy as tolerated by her kidney function, blood pressure and potassium levels.  If she does become short of breath again in the future, we can certainly schedule her for a thoracentesis for symptomatic relief.   I strongly encouraged her to avoid alcohol use completely as she continues to have 2-3 drinks in one sitting with her family each week.   Follow up in 3 months.  >60 minutes was spent on reviewing patient's records, direct patient care and completing documentation.   Freda Jackson, MD [facility] Office: [phone]   Current Outpatient Medications:  .  Accu-Chek FastClix Lancets MISC, CHECK FOR BLOOD SUGAR DAILY, Disp: 102 each, Rfl: 5 .  Ascorbic Acid (VITAMIN C) 1000 MG tablet, Take 1,000 mg by mouth at bedtime., Disp: , Rfl:  .  CALCIUM CARBONATE-VITAMIN D PO, Take 2 tablets by mouth at bedtime. Calcium 1200mg   Vitamin D 5000 units, Disp: , Rfl:  .  folic acid (FOLVITE) 1  MG tablet, Take 1 tablet (1 mg total) by mouth daily., Disp: , Rfl:  .  furosemide (LASIX) 40 MG tablet, Take 1 tablet (40 mg total) by mouth in the morning., Disp: 30 tablet, Rfl: 1 .  Insulin Aspart Prot & Aspart (NOVOLOG MIX 70/30 [place]), Inject 25-35 Units into the skin See admin instructions. Inject 35 units subcutaneously in the mornings and inject 25 units subcutaneously at night, Disp: , Rfl:  .  lactulose (CHRONULAC) 10 GM/15ML solution, Take 30 mLs (20 g total) by mouth 2 (two) times daily as needed for mild constipation. (Patient taking differently: Take 20 g by mouth 2 (two) times daily as needed (mild confusion (increased ammonia levels)).), Disp: 236 mL, Rfl: 2 .  latanoprost (XALATAN) 0.005 % ophthalmic solution, Place 1 drop into both eyes at bedtime. , Disp: , Rfl:  .  levothyroxine (SYNTHROID) 75 MCG tablet, Take 1 tablet (75 mcg total) by mouth daily., Disp: 90 tablet, Rfl: 1 .  LORazepam (ATIVAN) 0.5 MG tablet, Take 0.5 mg by mouth at bedtime., Disp: , Rfl:  .  metFORMIN (GLUCOPHAGE-XR) 500 MG 24 hr tablet, Take 1 tablet (500 mg total) by mouth daily., Disp: 180 tablet, Rfl: 1 .  metoprolol succinate (TOPROL-XL) 25 MG 24 hr tablet, TAKE 0.5 TABLETS BY MOUTH DAILY (Patient taking differently: Take 12.5 mg by mouth daily.), Disp:  30 tablet, Rfl: 0 .  pantoprazole (PROTONIX) 40 MG tablet, Take 1 tablet (40 mg total) by mouth daily. (Patient taking differently: Take 40 mg by mouth at bedtime.), Disp: 90 tablet, Rfl: 3 .  simvastatin (ZOCOR) 20 MG tablet, TAKE ONE TABLET BY MOUTH EVERY NIGHT AT BEDTIME (Patient taking differently: Take 20 mg by mouth at bedtime.), Disp: 90 tablet, Rfl: 0 .  spironolactone (ALDACTONE) 50 MG tablet, Take 1 tablet (50 mg total) by mouth daily., Disp: 30 tablet, Rfl: 1 .  vitamin E 180 MG (400 UNITS) capsule, Take 400 Units by mouth every evening., Disp: , Rfl:

## 2020-03-02 ENCOUNTER — Ambulatory Visit: Payer: HMO | Admitting: Hematology & Oncology

## 2020-03-02 ENCOUNTER — Other Ambulatory Visit: Payer: HMO

## 2020-03-02 LAB — BASIC METABOLIC PANEL
BUN/Creatinine Ratio: 16 (ref 12–28)
BUN: 19 mg/dL (ref 8–27)
CO2: 23 mmol/L (ref 20–29)
Calcium: 9.4 mg/dL (ref 8.7–10.3)
Chloride: 96 mmol/L (ref 96–106)
Creatinine, Ser: 1.18 mg/dL — ABNORMAL HIGH (ref 0.57–1.00)
GFR calc Af Amer: 49 mL/min/{1.73_m2} — ABNORMAL LOW (ref >59)
GFR calc non Af Amer: 42 mL/min/{1.73_m2} — ABNORMAL LOW (ref >59)
Glucose: 385 mg/dL — ABNORMAL HIGH (ref 65–99)
Potassium: 4.4 mmol/L (ref 3.5–5.2)
Sodium: 134 mmol/L (ref 134–144)

## 2020-03-02 LAB — MAGNESIUM: Magnesium: 1.6 mg/dL (ref 1.6–2.3)

## 2020-03-03 ENCOUNTER — Telehealth: Payer: Self-pay | Admitting: Cardiology

## 2020-03-03 NOTE — Telephone Encounter (Signed)
DIL Returning a call from our office to discuss lab results

## 2020-03-04 ENCOUNTER — Other Ambulatory Visit: Payer: Self-pay

## 2020-03-04 ENCOUNTER — Inpatient Hospital Stay: Payer: HMO

## 2020-03-04 VITALS — BP 113/62 | HR 102 | Temp 98.2°F | Resp 18

## 2020-03-04 DIAGNOSIS — K219 Gastro-esophageal reflux disease without esophagitis: Secondary | ICD-10-CM

## 2020-03-04 DIAGNOSIS — K5520 Angiodysplasia of colon without hemorrhage: Secondary | ICD-10-CM

## 2020-03-04 DIAGNOSIS — D500 Iron deficiency anemia secondary to blood loss (chronic): Secondary | ICD-10-CM | POA: Diagnosis not present

## 2020-03-04 DIAGNOSIS — R195 Other fecal abnormalities: Secondary | ICD-10-CM

## 2020-03-04 MED ORDER — SODIUM CHLORIDE 0.9 % IV SOLN
Freq: Once | INTRAVENOUS | Status: AC
  Filled 2020-03-04: qty 250

## 2020-03-04 MED ORDER — SODIUM CHLORIDE 0.9 % IV SOLN
200.0000 mg | Freq: Once | INTRAVENOUS | Status: AC
  Administered 2020-03-04: 200 mg via INTRAVENOUS
  Filled 2020-03-04: qty 200

## 2020-03-04 NOTE — Patient Instructions (Signed)

## 2020-03-08 ENCOUNTER — Encounter: Payer: Self-pay | Admitting: Internal Medicine

## 2020-03-08 ENCOUNTER — Inpatient Hospital Stay: Payer: HMO | Admitting: Hematology & Oncology

## 2020-03-08 ENCOUNTER — Inpatient Hospital Stay: Payer: HMO

## 2020-03-09 NOTE — Progress Notes (Signed)
This encounter was created in error - please disregard.

## 2020-03-14 ENCOUNTER — Encounter: Payer: Self-pay | Admitting: Nurse Practitioner

## 2020-03-14 ENCOUNTER — Non-Acute Institutional Stay (INDEPENDENT_AMBULATORY_CARE_PROVIDER_SITE_OTHER): Payer: HMO | Admitting: Nurse Practitioner

## 2020-03-14 DIAGNOSIS — K7031 Alcoholic cirrhosis of liver with ascites: Secondary | ICD-10-CM

## 2020-03-14 DIAGNOSIS — E871 Hypo-osmolality and hyponatremia: Secondary | ICD-10-CM | POA: Diagnosis not present

## 2020-03-14 DIAGNOSIS — E1165 Type 2 diabetes mellitus with hyperglycemia: Secondary | ICD-10-CM | POA: Diagnosis not present

## 2020-03-14 DIAGNOSIS — F419 Anxiety disorder, unspecified: Secondary | ICD-10-CM

## 2020-03-14 DIAGNOSIS — K219 Gastro-esophageal reflux disease without esophagitis: Secondary | ICD-10-CM

## 2020-03-14 DIAGNOSIS — I10 Essential (primary) hypertension: Secondary | ICD-10-CM

## 2020-03-14 DIAGNOSIS — I5033 Acute on chronic diastolic (congestive) heart failure: Secondary | ICD-10-CM

## 2020-03-14 DIAGNOSIS — D500 Iron deficiency anemia secondary to blood loss (chronic): Secondary | ICD-10-CM

## 2020-03-14 DIAGNOSIS — I482 Chronic atrial fibrillation, unspecified: Secondary | ICD-10-CM

## 2020-03-14 DIAGNOSIS — J90 Pleural effusion, not elsewhere classified: Secondary | ICD-10-CM

## 2020-03-14 DIAGNOSIS — E039 Hypothyroidism, unspecified: Secondary | ICD-10-CM

## 2020-03-14 DIAGNOSIS — Z794 Long term (current) use of insulin: Secondary | ICD-10-CM

## 2020-03-14 NOTE — Progress Notes (Signed)
Location:    [facility] Room Number: Between:  ALF [phone]) Provider: Lennie Odor Maisen Klingler NP  Virgie Dad, MD  Patient Care Team: Virgie Dad, MD as PCP - General (Internal Medicine) Berniece Salines, DO as PCP - Cardiology (Cardiology) Sheryn Bison, MD as Referring Physician (Dermatology) Gatha Mayer, MD as Consulting Physician (Gastroenterology) Marin Olp Rudell Cobb, MD as Consulting Physician (Oncology) Janan Ridge, MD as Consulting Physician (Dermatology) Paralee Cancel, MD as Consulting Physician (Orthopedic Surgery) Jacelyn Pi, MD as Consulting Physician (Endocrinology) Day, Melvenia Beam, [facility] (Inactive) as Pharmacist (Pharmacist) Berniece Salines, DO as Consulting Physician (Cardiology)  Extended Emergency Contact Information Primary Emergency Contact: Trinidad Curet [place] Phone: [phone] Mobile Phone: [phone] Relation: Son Secondary Emergency Contact: Joelly, [place] Mobile Phone: [phone] Relation: Relative Preferred language: English Interpreter needed? No  Code Status: DNR Goals of care: Advanced Directive information Advanced Directives 02/22/2020  Does Patient Have a Medical Advance Directive? Yes  Type of Paramedic of [place];Living will  Does patient want to make changes to medical advance directive? -  Copy of [place] in Chart? Yes - validated most recent copy scanned in chart (See row information)  Would patient like information on creating a medical advance directive? -  Pre-existing out of facility DNR order (yellow form or pink MOST form) -     Chief Complaint  Patient presents with  . Acute Visit    Blood sugar    HPI:  Pt is a 87 y.o. female seen today for an acute visit for managing blood sugar, CBG am 156-377, pm 60-469, takes insulin 70/30 35 u am, 25 u pm. Last Hgb a1c 7.6 12/2019. The patient admitted she is in her usual state of  health.  Takes Metformin 543m qd.  Type 2 diabetes, chronic insulin dependent  CHF/Edema chronic, LLE 2+, RLE trace to 1+. Takes Spironolactone,  Furosemide.  EF 60-65%. Bun/creat 19/1.18 03/01/20  Hyponatremia, Na 134 03/01/20  HTN, takes Metoprolol  Afib, asymptomatic, HR 110 bpm, f/u Cardiology.   GERD, takes Pantoprazole, f/u GI, gastric antral vascular ectasia.   Hypothyroidism, takes Levothyroxine 730m qd  Anxiety, take Lorazepam 0.45m66mhs  Pleural effusion, last seen by pulmonology 03/01/20. Last hospitalization 12/2019, underwent thoracentesis 01/13/21 and 2 litter fluid removed, negative culture, probably a transudate from liver cirrhosis    Anemia, takes Folic acid. Gastric antral vascular ectasia. Iron infusion 03/04/20. S/p transfused 12/2019 while in hospital. Hgb 8.5 02/20/33/00Alcoholic cirrhosis with ascites/portal hypertensive gastropathy     Past Medical History:  Diagnosis Date  . Anemia   . Angiodysplasia of ascending colon 10/25/2014  . Anxiety   . Arthritis   . Borderline diabetes   . Cancer of the skin, basal cell 09/03/2012  . Cir[place]osis ([place] . Diabetes mellitus without complication ([facility]CC[place] . Diverticular disease   . 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 GERD (gastroesophageal reflux disease)   . Hypertension   . Iron deficiency anemia due to chronic blood loss 01/19/2015  . Portal hypertensive gastropathy (HCC[place]/12/2016   ? Some GAVE also  . Thyroid disease    Past Surgical History:  Procedure Laterality Date  . ABDOMINAL HYSTERECTOMY    . APPENDECTOMY    . COLONOSCOPY    . ESOPHAGOGASTRODUODENOSCOPY    . ESOPHAGOGASTRODUODENOSCOPY (EGD) WITH PROPOFOL N/A 02/22/2020   Procedure: ESOPHAGOGASTRODUODENOSCOPY (EGD) WITH PROPOFOL;  Surgeon: GesGatha MayerD;  Location: [facility];  Service: Endoscopy;  Laterality: N/A;  . IR PARACENTESIS  05/25/2016  . IR THORACENTESIS ASP PLEURAL SPACE W/IMG GUIDE  02/23/2020  . LEG SKIN LESION  BIOPSY /  EXCISION  12/11/14  . MOHS SURGERY     ankle  . TONSILLECTOMY    . TOTAL HIP ARTHROPLASTY Bilateral 1993, 2006  . UPPER GASTROINTESTINAL ENDOSCOPY  09/09/2017    Allergies  Allergen Reactions  . Tizanidine Other (See Comments)    Hallucinate, confused     Allergies as of 03/14/2020      Reactions   Tizanidine Other (See Comments)   Hallucinate, confused       Medication List       Accurate as of March 14, 2020 11:59 PM. If you have any questions, ask your nurse or doctor.        STOP taking these medications   CALCIUM CARBONATE-VITAMIN D PO Stopped by: Cadince Hilscher X Alvino Lechuga, NP     TAKE these medications   Accu-Chek FastClix Lancets Misc CHECK FOR BLOOD SUGAR DAILY   acetaminophen 325 MG tablet Commonly known as: TYLENOL Take 650 mg by mouth every 4 (four) hours as needed.   folic acid 1 MG tablet Commonly known as: FOLVITE Take 1 tablet (1 mg total) by mouth daily.   furosemide 40 MG tablet Commonly known as: Lasix Take 1 tablet (40 mg total) by mouth in the morning.   furosemide 20 MG tablet Commonly known as: LASIX Take one by mouth at night Monday, Wednesday and Friday.   lactulose 10 GM/15ML solution Commonly known as: CHRONULAC Take 30 mLs (20 g total) by mouth 2 (two) times daily as needed for mild constipation. What changed: reasons to take this   latanoprost 0.005 % ophthalmic solution Commonly known as: XALATAN Place 1 drop into both eyes at bedtime.   levothyroxine 75 MCG tablet Commonly known as: SYNTHROID Take 1 tablet (75 mcg total) by mouth daily.   LORazepam 0.5 MG tablet Commonly known as: ATIVAN Take 0.5 mg by mouth at bedtime.   metFORMIN 500 MG 24 hr tablet Commonly known as: GLUCOPHAGE-XR Take 1 tablet (500 mg total) by mouth daily.   metoprolol succinate 25 MG 24 hr tablet Commonly known as: TOPROL-XL Take 12.5 mg by mouth daily. What changed: Another medication with the same name was removed. Continue taking this medication,  and follow the directions you see here. Changed by: Betsey Sossamon X Myranda Pavone, NP   NOVOLOG MIX 70/30 [place] Inject 25-35 Units into the skin See admin instructions. Inject 35 units subcutaneously in the mornings and inject 25 units subcutaneously at night   pantoprazole 40 MG tablet Commonly known as: PROTONIX Take 1 tablet (40 mg total) by mouth daily.   simvastatin 20 MG tablet Commonly known as: ZOCOR TAKE ONE TABLET BY MOUTH EVERY NIGHT AT BEDTIME   spironolactone 50 MG tablet Commonly known as: Aldactone Take 1 tablet (50 mg total) by mouth daily.   vitamin C 1000 MG tablet Take 1,000 mg by mouth at bedtime.   vitamin E 180 MG (400 UNITS) capsule Take 400 Units by mouth every evening.       Review of Systems  Constitutional: Negative for activity change, appetite change, chills, diaphoresis, fatigue and fever.  HENT: Positive for hearing loss. Negative for congestion, trouble swallowing and voice change.   Eyes: Negative for visual disturbance.  Respiratory: Negative for cough, shortness of breath and wheezing.   Cardiovascular: Positive for leg swelling. Negative for chest pain  and palpitations.  Gastrointestinal: Negative for abdominal pain, constipation, nausea and vomiting.  Genitourinary: Negative for difficulty urinating, dysuria and urgency.  Musculoskeletal: Positive for arthralgias. Negative for gait problem.  Skin: Negative for color change.  Neurological: Negative for speech difficulty, weakness, light-headedness and headaches.  Psychiatric/Behavioral: Negative for behavioral problems, hallucinations and sleep disturbance. The patient is not nervous/anxious.     Immunization History  Administered Date(s) Administered  . Fluad Quad(high Dose 65+) 10/27/2018, 02/09/2020  . Hepatitis B, adult 05/12/2014  . Hepatitis B, ped/adol 08/10/2014  . Influenza Split 11/02/2009, 10/15/2010  . Influenza, High Dose Seasonal PF 10/13/2014, 01/26/2017  . Influenza, Seasonal, Injecte,  Preservative Fre 10/13/2014  . Influenza,inj,quad, With Preservative 11/22/2016  . Influenza-Unspecified 11/25/2013, 10/06/2015, 10/22/2017  . Moderna Sars-Covid-2 Vaccination 03/26/2019, 04/23/2019  . Pneumococcal Conjugate-13 05/17/2014  . Pneumococcal Polysaccharide-23 10/06/2015  . Tdap 10/02/2011, 11/06/2016  . Zoster 01/22/2005   Pertinent  Health Maintenance Due  Topic Date Due  . FOOT EXAM  05/12/2015  . COLONOSCOPY (Pts 45-85yr Insurance coverage will need to be confirmed)  10/25/2015  . URINE MICROALBUMIN  04/27/2020  . OPHTHALMOLOGY EXAM  05/17/2020  . HEMOGLOBIN A1C  07/13/2020  . INFLUENZA VACCINE  Completed  . DEXA SCAN  Completed  . PNA vac Low Risk Adult  Addressed   Fall Risk  04/28/2019 01/09/2018 10/26/2016 10/09/2016 11/25/2015  Falls in the past year? 1 0 Yes No Yes  Number falls in past yr: 0 - 2 or more - 2 or more  Injury with Fall? 0 - No - Yes  Comment - - - - -  Risk Factor Category  - - High Fall Risk - High Fall Risk  Risk for fall due to : - - Impaired balance/gait - -  Follow up Falls evaluation completed - - - Falls prevention discussed   Functional Status Survey:    Vitals:   03/14/20 1638  BP: 120/80  Pulse: 88  Resp: 18  Temp: 98.1 F (36.7 C)  SpO2: 96%  Weight: 178 lb (80.7 kg)  Height: 5' 7"  (1.702 m)   Body mass index is 27.88 kg/m. Physical Exam Vitals and nursing note reviewed.  Constitutional:      General: She is not in acute distress.    Appearance: Normal appearance. She is not ill-appearing, toxic-appearing or diaphoretic.  HENT:     Head: Normocephalic and atraumatic.     Nose: Nose normal.     Mouth/Throat:     Mouth: Mucous membranes are moist.  Eyes:     Extraocular Movements: Extraocular movements intact.     Conjunctiva/sclera: Conjunctivae normal.     Pupils: Pupils are equal, round, and reactive to light.  Cardiovascular:     Rate and Rhythm: Tachycardia present. Rhythm irregular.     Heart sounds: No  murmur heard.   Pulmonary:     Breath sounds: Rales present.     Comments: bibasilar Abdominal:     General: Bowel sounds are normal. There is no distension.     Palpations: Abdomen is soft.     Tenderness: There is no abdominal tenderness.  Musculoskeletal:        General: Tenderness present.     Cervical back: Normal range of motion and neck supple.     Right lower leg: Edema present.     Left lower leg: Edema present.     Comments: Trace edema BLE. C/o lower back at night.   Skin:    General: Skin is warm and  dry.  Neurological:     General: No focal deficit present.     Mental Status: She is alert and oriented to person, place, and time. Mental status is at baseline.  Psychiatric:        Mood and Affect: Mood normal.        Behavior: Behavior normal.        Thought Content: Thought content normal.        Judgment: Judgment normal.     Labs reviewed: Recent Labs    12/14/19 1113 01/12/20 1241 01/13/20 0610 01/14/20 0618 02/09/20 1409 02/18/20 0946 03/01/20 1336  NA 137   < > 141   < > 139 135 134  K 3.6   < > 3.4*   < > 3.8 4.2 4.4  CL 99   < > 105   < > 103 101 96  CO2 24   < > 23   < > 30 27 23   GLUCOSE 332*   < > 181*   < > 247* 228* 385*  BUN 13   < > 22   < > 22 26* 19  CREATININE 1.04*   < > 1.11*   < > 1.36* 1.25* 1.18*  CALCIUM 8.8   < > 9.2   < > 9.8 9.2 9.4  MG 1.5*  --  2.2  --   --   --  1.6   < > = values in this interval not displayed.   Recent Labs    01/14/20 0614 01/21/20 1137 02/01/20 1446  AST 36 32 40*  ALT 19 16 21   ALKPHOS 70 89 95  BILITOT 2.4* 1.8* 1.8*  PROT 5.5* 5.7* 6.1  ALBUMIN 3.3* 3.5 3.6   Recent Labs    01/16/20 0538 01/21/20 1137 02/01/20 1446 02/22/20 0930  WBC 2.8* 4.7 5.0 3.3*  NEUTROABS 1.7 3.4 3.7  --   HGB 8.1* 9.2* 10.7* 8.5*  HCT 26.5* 30.6* 32.8* 26.6*  MCV 82.6 85.5 85.3 90.5  PLT 40* 53* 75.0* 58*   Lab Results  Component Value Date   TSH 2.65 11/09/2019   Lab Results  Component Value Date    HGBA1C 7.6 (H) 01/13/2020   Lab Results  Component Value Date   CHOL 112 11/09/2019   HDL 46 (L) 11/09/2019   LDLCALC 52 11/09/2019   TRIG 61 11/09/2019   CHOLHDL 2.4 11/09/2019    Significant Diagnostic Results in last 30 days:  DG Chest 1 View  Result Date: 02/23/2020 CLINICAL DATA:  Post thoracentesis. EXAM: CHEST  1 VIEW COMPARISON:  02/09/2020. FINDINGS: Mediastinum and hilar structures normal. Heart size normal. Mild bibasilar subsegmental atelectasis. Near complete resolution of right pleural effusion. No pneumothorax. Mild thoracic spine scoliosis. IMPRESSION: Near complete resolution of right pleural effusion. No pneumothorax. Electronically Signed   By: Marcello Moores  Register   On: 02/23/2020 10:43   IR THORACENTESIS ASP PLEURAL SPACE W/IMG GUIDE  Result Date: 02/23/2020 INDICATION: Patient with history of alcoholic cirrhosis, HF, dyspnea, and recurrent right pleural effusion. Request made for diagnostic and therapeutic right thoracentesis. EXAM: ULTRASOUND GUIDED DIAGNOSTIC AND THERAPEUTIC RIGHT THORACENTESIS MEDICATIONS: 9 mL 1% lidocaine COMPLICATIONS: None immediate. PROCEDURE: An ultrasound guided thoracentesis was thoroughly discussed with the patient and questions answered. The benefits, risks, alternatives and complications were also discussed. The patient understands and wishes to proceed with the procedure. Written consent was obtained. Ultrasound was performed to localize and mark an adequate pocket of fluid in the right chest. The area was then prepped  and draped in the normal sterile fashion. 1% Lidocaine was used for local anesthesia. Under ultrasound guidance a 6 Fr Safe-T-Centesis catheter was introduced by [employer], PA-C. Thoracentesis was performed. The catheter was removed and a dressing applied. FINDINGS: A total of approximately 1 L of clear amber fluid was removed. Samples were sent to the laboratory as requested by the clinical team. IMPRESSION: Successful ultrasound  guided right thoracentesis yielding 1 L of pleural fluid. Five Read by: Earley Abide, PA-C Electronically Signed   By: Jerilynn Mages.  Shick M.D.   On: 02/23/2020 10:56    Assessment/Plan: Type 2 diabetes mellitus with hyperglycemia, with long-term current use of insulin (HCC)  CBG am 156-377, pm 60-469, takes insulin 70/30 35 u am, 25 u pm. Last Hgb a1c 7.6 12/2019. The patient admitted she is in her usual state of health.  Takes Metformin 532m qd.  Type 2 diabetes, chronic insulin dependent  Will add Novolog 5u for CBG >200 since CBG range 60-469 am and pm.    Acute on chronic diastolic CHF (congestive heart failure) (HCC) CHF/Edema chronic, LLE 2+, RLE trace to 1+. Takes Spironolactone,  Furosemide. EF 60-65%. Bun/creat 19/1.18 03/01/20. Will update CMP/eGFR   Hyponatremia Na 134 03/01/20, repeat CMP/eGFR   Essential hypertension Blood pressure is controlled, continue Metoprolol.   Chronic atrial fibrillation (HCC) asymptomatic, HR 110 bpm, f/u Cardiology.    GERD without esophagitis takes Pantoprazole, f/u GI, gastric antral vascular ectasia.    Hypothyroidism , takes Levothyroxine 779m qd, update TSH   Anxiety take Lorazepam 0.27m327mhs  Pleural effusion on right Pleural effusion, last seen by pulmonology 03/01/20. Last hospitalization 12/2019, underwent thoracentesis 01/13/21 and 2 litter fluid removed, negative culture, probably a transudate from liver cirrhosis   Iron deficiency anemia due to chronic blood loss Anemia, takes Folic acid. Gastric antral vascular ectasia. Iron infusion 03/04/20. S/p transfused 12/2019 while in hospital. Hgb 8.5 02/22/20, repeat CBC/diff.    Alcoholic cirrhosis of liver with ascites (HCC) Alcoholic cirrhosis with ascites/portal hypertensive gastropathy   Chronic lower back pain At night, desire Tylenol hs. Observe    Family/ staff Communication: plan of care reviewed with the patient and charge nurse.   Labs/tests ordered:  CBC/diff, CMP/eGFR,  TSH  Time spend 40 minutes.

## 2020-03-15 ENCOUNTER — Encounter: Payer: Self-pay | Admitting: Nurse Practitioner

## 2020-03-15 ENCOUNTER — Non-Acute Institutional Stay (INDEPENDENT_AMBULATORY_CARE_PROVIDER_SITE_OTHER): Payer: HMO | Admitting: Internal Medicine

## 2020-03-15 ENCOUNTER — Encounter: Payer: Self-pay | Admitting: Internal Medicine

## 2020-03-15 DIAGNOSIS — E871 Hypo-osmolality and hyponatremia: Secondary | ICD-10-CM | POA: Insufficient documentation

## 2020-03-15 DIAGNOSIS — K219 Gastro-esophageal reflux disease without esophagitis: Secondary | ICD-10-CM | POA: Diagnosis not present

## 2020-03-15 DIAGNOSIS — E1165 Type 2 diabetes mellitus with hyperglycemia: Secondary | ICD-10-CM

## 2020-03-15 DIAGNOSIS — F419 Anxiety disorder, unspecified: Secondary | ICD-10-CM

## 2020-03-15 DIAGNOSIS — E039 Hypothyroidism, unspecified: Secondary | ICD-10-CM | POA: Diagnosis not present

## 2020-03-15 DIAGNOSIS — Z794 Long term (current) use of insulin: Secondary | ICD-10-CM

## 2020-03-15 DIAGNOSIS — J90 Pleural effusion, not elsewhere classified: Secondary | ICD-10-CM

## 2020-03-15 DIAGNOSIS — I5032 Chronic diastolic (congestive) heart failure: Secondary | ICD-10-CM

## 2020-03-15 NOTE — Assessment & Plan Note (Signed)
Pleural effusion, last seen by pulmonology 03/01/20. Last hospitalization 12/2019, underwent thoracentesis 01/13/21 and 2 litter fluid removed, negative culture, probably a transudate from liver cirrhosis   

## 2020-03-15 NOTE — Assessment & Plan Note (Signed)
Alcoholic cirrhosis with ascites/portal hypertensive gastropathy

## 2020-03-15 NOTE — Assessment & Plan Note (Signed)
Blood pressure is controlled, continue Metoprolol. 

## 2020-03-15 NOTE — Assessment & Plan Note (Signed)
CHF/Edema chronic, LLE 2+, RLE trace to 1+. Takes Spironolactone,  Furosemide. EF 60-65%. Bun/creat 19/1.18 03/01/20. Will update CMP/eGFR

## 2020-03-15 NOTE — Assessment & Plan Note (Signed)
Na 134 03/01/20, repeat CMP/eGFR

## 2020-03-15 NOTE — Assessment & Plan Note (Signed)
asymptomatic, HR 110 bpm, f/u Cardiology.

## 2020-03-15 NOTE — Assessment & Plan Note (Signed)
,   takes Levothyroxine 81mcg qd, update TSH

## 2020-03-15 NOTE — Assessment & Plan Note (Signed)
CBG am 156-377, pm 60-469, takes insulin 70/30 35 u am, 25 u pm. Last Hgb a1c 7.6 12/2019. The patient admitted she is in her usual state of health.  Takes Metformin 500mg  qd.  Type 2 diabetes, chronic insulin dependent  Will add Novolog 5u for CBG >200 since CBG range 60-469 am and pm.

## 2020-03-15 NOTE — Progress Notes (Signed)
Location: [place] of Service:  ALF (13)  Provider:   Code Status:  Goals of Care:  Advanced Directives 02/22/2020  Does Patient Have a Medical Advance Directive? Yes  Type of Paramedic of [place];Living will  Does patient want to make changes to medical advance directive? -  Copy of [place] in Chart? Yes - validated most recent copy scanned in chart (See row information)  Would patient like information on creating a medical advance directive? -  Pre-existing out of facility DNR order (yellow form or pink MOST form) -     Chief Complaint  Patient presents with  . Acute Visit    HPI: Patient is a 87 y.o. female seen today for an acute visit for New Admit to AL also for Elevated CBG Patient is new admit to [facility] Patient was living in community but was unable to take care of herself and her medicines.  And her son decided to move her to [place]. Patient walks with a walker.  Cognitively intact  Her active issues Diabetes mellitus type 2 Since she has been here her blood sugars have been running more than 400 most of the time sometimes more than 500.  Patient is completely asymptomatic denies any dysuria fever cough frequency.  Her last A1c 7.6 done on 12/21 History of alcoholic liver cirrhosis On lactulose as needed Portal hypertensive gastropathy s/p EGD Diastolic CHF Controlled by Lasix and Aldactone History of recurrent pleural effusion S/p thoracocentesis.  Transudative in nature most likely related to her liver cirrhosis per pulmonology Iron deficiency anemia secondary to chronic intermittent GI blood loss on IV iron infusions   Past Medical History:  Diagnosis Date  . Anemia   . Angiodysplasia of ascending colon 10/25/2014  . Anxiety   . Arthritis   . Borderline diabetes   . Cancer of the skin, basal cell 09/03/2012  . Cirrhosis ([place])   . Diabetes mellitus without complication ([place])   . Diverticular  disease   . 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 GERD (gastroesophageal reflux disease)   . Hypertension   . Iron deficiency anemia due to chronic blood loss 01/19/2015  . Portal hypertensive gastropathy ([place]) 08/02/2016   ? Some GAVE also  . Thyroid disease     Past Surgical History:  Procedure Laterality Date  . ABDOMINAL HYSTERECTOMY    . APPENDECTOMY    . COLONOSCOPY    . ESOPHAGOGASTRODUODENOSCOPY    . ESOPHAGOGASTRODUODENOSCOPY (EGD) WITH PROPOFOL N/A 02/22/2020   Procedure: ESOPHAGOGASTRODUODENOSCOPY (EGD) WITH PROPOFOL;  Surgeon: Gatha Mayer, MD;  Location: [facility];  Service: Endoscopy;  Laterality: N/A;  . IR PARACENTESIS  05/25/2016  . IR THORACENTESIS ASP PLEURAL SPACE W/IMG GUIDE  02/23/2020  . LEG SKIN LESION  BIOPSY / EXCISION  12/11/14  . MOHS SURGERY     ankle  . TONSILLECTOMY    . TOTAL HIP ARTHROPLASTY Bilateral 1993, 2006  . UPPER GASTROINTESTINAL ENDOSCOPY  09/09/2017    Allergies  Allergen Reactions  . Tizanidine Other (See Comments)    Hallucinate, confused     Outpatient Encounter Medications as of 03/15/2020  Medication Sig  . insulin aspart (NOVOLOG) 100 UNIT/ML injection Inject into the skin. Per Sliding scale  . insulin glargine (LANTUS) 100 UNIT/ML injection Inject 30 Units into the skin every morning.  . Accu-Chek FastClix Lancets MISC CHECK FOR BLOOD SUGAR DAILY  . Ascorbic Acid (VITAMIN C) 1000 MG tablet Take 1,000 mg  by mouth at bedtime.  . folic acid (FOLVITE) 1 MG tablet Take 1 tablet (1 mg total) by mouth daily.  . furosemide (LASIX) 20 MG tablet Take one by mouth at night Monday, Wednesday and Friday.  . furosemide (LASIX) 40 MG tablet Take 1 tablet (40 mg total) by mouth in the morning.  . lactulose (CHRONULAC) 10 GM/15ML solution Take 30 mLs (20 g total) by mouth 2 (two) times daily as needed for mild constipation. (Patient taking differently: Take 20 g by mouth 2 (two) times daily as needed (mild confusion (increased  ammonia levels)).)  . latanoprost (XALATAN) 0.005 % ophthalmic solution Place 1 drop into both eyes at bedtime.   Marland Kitchen levothyroxine (SYNTHROID) 75 MCG tablet Take 1 tablet (75 mcg total) by mouth daily.  Marland Kitchen LORazepam (ATIVAN) 0.5 MG tablet Take 0.5 mg by mouth at bedtime.  . metFORMIN (GLUCOPHAGE-XR) 500 MG 24 hr tablet Take 1 tablet (500 mg total) by mouth daily.  . metoprolol succinate (TOPROL-XL) 25 MG 24 hr tablet Take 12.5 mg by mouth daily.  . pantoprazole (PROTONIX) 40 MG tablet Take 1 tablet (40 mg total) by mouth daily.  . simvastatin (ZOCOR) 20 MG tablet TAKE ONE TABLET BY MOUTH EVERY NIGHT AT BEDTIME  . spironolactone (ALDACTONE) 50 MG tablet Take 1 tablet (50 mg total) by mouth daily.  . vitamin E 180 MG (400 UNITS) capsule Take 400 Units by mouth every evening.  . [DISCONTINUED] Insulin Aspart Prot & Aspart (NOVOLOG MIX 70/30 [place]) Inject 25-35 Units into the skin See admin instructions. Inject 35 units subcutaneously in the mornings and inject 25 units subcutaneously at night   No facility-administered encounter medications on file as of 03/15/2020.    Review of Systems:  Review of Systems  Review of Systems  Constitutional: Negative for activity change, appetite change, chills, diaphoresis, fatigue and fever.  HENT: Negative for mouth sores, postnasal drip, rhinorrhea, sinus pain and sore throat.   Respiratory: Negative for apnea, cough, chest tightness, shortness of breath and wheezing.   Cardiovascular: Negative for chest pain, palpitations and leg swelling.  Gastrointestinal: Negative for abdominal distention, abdominal pain, constipation, diarrhea, nausea and vomiting.  Genitourinary: Negative for dysuria and frequency.  Musculoskeletal: Negative for arthralgias, joint swelling and myalgias.  Skin: Negative for rash.  Neurological: Negative for dizziness, syncope, weakness, light-headedness and numbness.  Psychiatric/Behavioral: Negative for behavioral problems, confusion and  sleep disturbance.     Health Maintenance  Topic Date Due  . FOOT EXAM  05/12/2015  . COLONOSCOPY (Pts 45-57yrs Insurance coverage will need to be confirmed)  10/25/2015  . COVID-19 Vaccine (3 - Moderna risk 4-dose series) 05/21/2019  . URINE MICROALBUMIN  04/27/2020  . OPHTHALMOLOGY EXAM  05/17/2020  . HEMOGLOBIN A1C  07/13/2020  . TETANUS/TDAP  11/07/2026  . INFLUENZA VACCINE  Completed  . DEXA SCAN  Completed  . PNA vac Low Risk Adult  Addressed    Physical Exam: Vitals:   03/15/20 2147  BP: 124/78  Pulse: (!) 113  Resp: 20  Temp: 98.5 F (36.9 C)   There is no height or weight on file to calculate BMI. Physical Exam  Constitutional: Oriented to person, place, and time. Well-developed and well-nourished.  HENT:  Head: Normocephalic.  Mouth/Throat: Oropharynx is clear and moist.  Eyes: Pupils are equal, round, and reactive to light.  Neck: Neck supple.  Cardiovascular: Normal rate and normal heart sounds.  No murmur heard. Pulmonary/Chest: Effort normal and breath sounds normal. No respiratory distress. No wheezes. She  has no rales.  Abdominal: Soft. Bowel sounds are normal. No distension. There is no tenderness. There is no rebound.  Musculoskeletal: Mild Edema Bilateral Lymphadenopathy: none Neurological: Alert and oriented to person, place, and time.  Skin: Skin is warm and dry.  Psychiatric: Normal mood and affect. Behavior is normal. Thought content normal.    Labs reviewed: Basic Metabolic Panel: Recent Labs    04/28/19 1139 05/20/19 1315 11/09/19 1137 11/14/19 1136 12/14/19 1113 01/12/20 1241 01/13/20 0610 01/14/20 0618 02/09/20 1409 02/18/20 0946 03/01/20 1336  NA 134*   < > 136   < > 137   < > 141   < > 139 135 134  K 4.0   < > 3.6   < > 3.6   < > 3.4*   < > 3.8 4.2 4.4  CL 102   < > 100   < > 99   < > 105   < > 103 101 96  CO2 25   < > 25   < > 24   < > 23   < > 30 27 23   GLUCOSE 321*   < > 357*   < > 332*   < > 181*   < > 247* 228* 385*   BUN 21   < > 14   < > 13   < > 22   < > 22 26* 19  CREATININE 1.02   < > 1.02*   < > 1.04*   < > 1.11*   < > 1.36* 1.25* 1.18*  CALCIUM 9.2   < > 9.2   < > 8.8   < > 9.2   < > 9.8 9.2 9.4  MG  --   --   --   --  1.5*  --  2.2  --   --   --  1.6  TSH 3.31  --  2.65  --   --   --   --   --   --   --   --    < > = values in this interval not displayed.   Liver Function Tests: Recent Labs    01/14/20 0614 01/21/20 1137 02/01/20 1446  AST 36 32 40*  ALT 19 16 21   ALKPHOS 70 89 95  BILITOT 2.4* 1.8* 1.8*  PROT 5.5* 5.7* 6.1  ALBUMIN 3.3* 3.5 3.6   No results for input(s): LIPASE, AMYLASE in the last 8760 hours. Recent Labs    01/13/20 0610 01/16/20 1213 01/17/20 1048  AMMONIA 37* 71* 53*   CBC: Recent Labs    01/16/20 0538 01/21/20 1137 02/01/20 1446 02/22/20 0930  WBC 2.8* 4.7 5.0 3.3*  NEUTROABS 1.7 3.4 3.7  --   HGB 8.1* 9.2* 10.7* 8.5*  HCT 26.5* 30.6* 32.8* 26.6*  MCV 82.6 85.5 85.3 90.5  PLT 40* 53* 75.0* 58*   Lipid Panel: Recent Labs    04/28/19 1139 11/09/19 1137  CHOL 124 112  HDL 66.90 46*  LDLCALC 43 52  TRIG 71.0 61  CHOLHDL 2 2.4   Lab Results  Component Value Date   HGBA1C 7.6 (H) 01/13/2020    Procedures since last visit: DG Chest 1 View  Result Date: 02/23/2020 CLINICAL DATA:  Post thoracentesis. EXAM: CHEST  1 VIEW COMPARISON:  02/09/2020. FINDINGS: Mediastinum and hilar structures normal. Heart size normal. Mild bibasilar subsegmental atelectasis. Near complete resolution of right pleural effusion. No pneumothorax. Mild thoracic spine scoliosis. IMPRESSION: Near complete resolution of right pleural effusion.  No pneumothorax. Electronically Signed   By: Marcello Moores  Register   On: 02/23/2020 10:43   IR THORACENTESIS ASP PLEURAL SPACE W/IMG GUIDE  Result Date: 02/23/2020 INDICATION: Patient with history of alcoholic cirrhosis, HF, dyspnea, and recurrent right pleural effusion. Request made for diagnostic and therapeutic right thoracentesis. EXAM:  ULTRASOUND GUIDED DIAGNOSTIC AND THERAPEUTIC RIGHT THORACENTESIS MEDICATIONS: 9 mL 1% lidocaine COMPLICATIONS: None immediate. PROCEDURE: An ultrasound guided thoracentesis was thoroughly discussed with the patient and questions answered. The benefits, risks, alternatives and complications were also discussed. The patient understands and wishes to proceed with the procedure. Written consent was obtained. Ultrasound was performed to localize and mark an adequate pocket of fluid in the right chest. The area was then prepped and draped in the normal sterile fashion. 1% Lidocaine was used for local anesthesia. Under ultrasound guidance a 6 Fr Safe-T-Centesis catheter was introduced by [employer], PA-C. Thoracentesis was performed. The catheter was removed and a dressing applied. FINDINGS: A total of approximately 1 L of clear amber fluid was removed. Samples were sent to the laboratory as requested by the clinical team. IMPRESSION: Successful ultrasound guided right thoracentesis yielding 1 L of pleural fluid. Five Read by: Earley Abide, PA-C Electronically Signed   By: Jerilynn Mages.  Shick M.D.   On: 02/23/2020 10:56    Assessment/Plan 1. Type 2 diabetes mellitus with hyperglycemia, with long-term current use of insulin (HCC) Discontinue NPH and started on Lantus 30 units Cover with Sliding scale insulin for now till CBG stabilizes Have to do loose sliding scale due to Liver citrrhosis  Also on Metformin Chronic diastolic heart failure (HCC) Does have Moderate Edema Continue on Lasix and Aldactone for now  Hypothyroidism, unspecified type TSH normal in 10/21 Pleural effusion on right Due to her Cirrhosis Continue to monitor On Diuretics Anxiety On Ativan Portal Gastropathy With Anemia On Iron Infusions per Hematology Continue on Protonix  Alcoholic Cirrhosis On Lactulose and Aldactone  Repeat Labs are pending  Labs/tests ordered:  * No order type specified * Next appt:  Visit date not found

## 2020-03-15 NOTE — Assessment & Plan Note (Signed)
takes Pantoprazole, f/u GI, gastric antral vascular ectasia. 

## 2020-03-15 NOTE — Assessment & Plan Note (Signed)
take Lorazepam 0.5mg  qhs

## 2020-03-15 NOTE — Assessment & Plan Note (Signed)
At night, desire Tylenol hs. Observe

## 2020-03-15 NOTE — Assessment & Plan Note (Signed)
Anemia, takes Folic acid. Gastric antral vascular ectasia. Iron infusion 03/04/20. S/p transfused 12/2019 while in hospital. Hgb 8.5 02/22/20, repeat CBC/diff.

## 2020-03-16 ENCOUNTER — Encounter: Payer: Self-pay | Admitting: Nurse Practitioner

## 2020-03-17 LAB — CBC AND DIFFERENTIAL
HCT: 28 — AB (ref 36–46)
Hemoglobin: 8.8 — AB (ref 12.0–16.0)
Neutrophils Absolute: 1604
Platelets: 55 — AB (ref 150–399)
WBC: 2.6

## 2020-03-17 LAB — BASIC METABOLIC PANEL
BUN: 26 — AB (ref 4–21)
CO2: 28 — AB (ref 13–22)
Chloride: 103 (ref 99–108)
Creatinine: 1.1 (ref 0.5–1.1)
Glucose: 172
Potassium: 3.7 (ref 3.4–5.3)
Sodium: 139 (ref 137–147)

## 2020-03-17 LAB — HEPATIC FUNCTION PANEL
ALT: 18 (ref 7–35)
AST: 23 (ref 13–35)
Alkaline Phosphatase: 70 (ref 25–125)
Bilirubin, Total: 1

## 2020-03-17 LAB — COMPREHENSIVE METABOLIC PANEL
Albumin: 3.1 — AB (ref 3.5–5.0)
Calcium: 9.1 (ref 8.7–10.7)
Globulin: 2.1

## 2020-03-17 LAB — TSH: TSH: 2.27 (ref 0.41–5.90)

## 2020-03-17 LAB — CBC: RBC: 3.07 — AB (ref 3.87–5.11)

## 2020-03-22 ENCOUNTER — Encounter: Payer: Self-pay | Admitting: Internal Medicine

## 2020-03-22 ENCOUNTER — Non-Acute Institutional Stay (INDEPENDENT_AMBULATORY_CARE_PROVIDER_SITE_OTHER): Payer: HMO | Admitting: Internal Medicine

## 2020-03-22 DIAGNOSIS — E1165 Type 2 diabetes mellitus with hyperglycemia: Secondary | ICD-10-CM | POA: Diagnosis not present

## 2020-03-22 DIAGNOSIS — D500 Iron deficiency anemia secondary to blood loss (chronic): Secondary | ICD-10-CM | POA: Diagnosis not present

## 2020-03-22 DIAGNOSIS — Z794 Long term (current) use of insulin: Secondary | ICD-10-CM

## 2020-03-22 DIAGNOSIS — I5032 Chronic diastolic (congestive) heart failure: Secondary | ICD-10-CM

## 2020-03-22 DIAGNOSIS — K219 Gastro-esophageal reflux disease without esophagitis: Secondary | ICD-10-CM

## 2020-03-22 DIAGNOSIS — E039 Hypothyroidism, unspecified: Secondary | ICD-10-CM

## 2020-03-22 NOTE — Progress Notes (Signed)
Location:   [place] Room Number: [place]:  ALF [phone]) Provider:  Veleta Miners MD  Virgie Dad, MD  Patient Care Team: Virgie Dad, MD as PCP - General (Internal Medicine) Berniece Salines, DO as PCP - Cardiology (Cardiology) Sheryn Bison, MD as Referring Physician (Dermatology) Gatha Mayer, MD as Consulting Physician (Gastroenterology) Marin Olp Rudell Cobb, MD as Consulting Physician (Oncology) Janan Ridge, MD as Consulting Physician (Dermatology) Paralee Cancel, MD as Consulting Physician (Orthopedic Surgery) Jacelyn Pi, MD as Consulting Physician (Endocrinology) Day, Melvenia Beam, [facility] (Inactive) as Pharmacist (Pharmacist) Berniece Salines, DO as Consulting Physician (Cardiology)  Extended Emergency Contact Information Primary Emergency Contact: Trinidad Curet [place] of [place] Phone: [phone] Mobile Phone: [phone] Relation: Son Secondary Emergency Contact: Denisse, [place] Mobile Phone: [phone] Relation: Relative Preferred language: English Interpreter needed? No  Code Status:  DNR Goals of care: Advanced Directive information Advanced Directives 02/22/2020  Does Patient Have a Medical Advance Directive? Yes  Type of Paramedic of [place];Living will  Does patient want to make changes to medical advance directive? -  Copy of [place] in Chart? Yes - validated most recent copy scanned in chart (See row information)  Would patient like information on creating a medical advance directive? -  Pre-existing out of facility DNR order (yellow form or pink MOST form) -     Chief Complaint  Patient presents with  . Acute Visit    High CBG    HPI:  Pt is a 87 y.o. female seen today for an acute visit for Hyperglycemia  Patient is new admit to [place] Patient was living in community but was unable to take care of herself and her medicines.  And her son decided to move her  to [place]. Patient walks with a walker.  Cognitively intact  Her active issues Diabetes mellitus type 2 Changed her NPH to Lantus for better coverage Still continues to have CBG more then 150 in AM. 300-400 in afternoon and PM Pancytopenia with Iron Def Anemia Most likely due to her Cirrhosis Has follow up with Hematology  History of alcoholic liver cirrhosis On lactulose as needed Portal hypertensive gastropathy s/p EGD Diastolic CHF Controlled by Lasix and Aldactone History of recurrent pleural effusion S/p thoracocentesis.  Transudative in nature most likely related to her liver cirrhosis per pulmonology   Past Medical History:  Diagnosis Date  . Anemia   . Angiodysplasia of ascending colon 10/25/2014  . Anxiety   . Arthritis   . Borderline diabetes   . Cancer of the skin, basal cell 09/03/2012  . Cirrhosis ([place])   . Diabetes mellitus without complication ([place])   . Diverticular disease   . 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 GERD (gastroesophageal reflux disease)   . Hypertension   . Iron deficiency anemia due to chronic blood loss 01/19/2015  . Portal hypertensive gastropathy ([place]) 08/02/2016   ? Some GAVE also  . Thyroid disease    Past Surgical History:  Procedure Laterality Date  . ABDOMINAL HYSTERECTOMY    . APPENDECTOMY    . COLONOSCOPY    . ESOPHAGOGASTRODUODENOSCOPY    . ESOPHAGOGASTRODUODENOSCOPY (EGD) WITH PROPOFOL N/A 02/22/2020   Procedure: ESOPHAGOGASTRODUODENOSCOPY (EGD) WITH PROPOFOL;  Surgeon: Gatha Mayer, MD;  Location: [facility];  Service: Endoscopy;  Laterality: N/A;  . IR PARACENTESIS  05/25/2016  . IR THORACENTESIS ASP PLEURAL SPACE W/IMG GUIDE  02/23/2020  . LEG SKIN LESION  BIOPSY /  EXCISION  12/11/14  . MOHS SURGERY     ankle  . TONSILLECTOMY    . TOTAL HIP ARTHROPLASTY Bilateral 1993, 2006  . UPPER GASTROINTESTINAL ENDOSCOPY  09/09/2017    Allergies  Allergen Reactions  . Tizanidine Other (See Comments)     Hallucinate, confused     Allergies as of 03/22/2020      Reactions   Tizanidine Other (See Comments)   Hallucinate, confused       Medication List       Accurate as of March 22, 2020 10:53 AM. If you have any questions, ask your nurse or doctor.        Accu-Chek FastClix Lancets Misc CHECK FOR BLOOD SUGAR DAILY   acetaminophen 325 MG tablet Commonly known as: TYLENOL Take 650 mg by mouth every 6 (six) hours as needed.   folic acid 1 MG tablet Commonly known as: FOLVITE Take 1 tablet (1 mg total) by mouth daily.   furosemide 40 MG tablet Commonly known as: Lasix Take 1 tablet (40 mg total) by mouth in the morning.   furosemide 20 MG tablet Commonly known as: LASIX Take one by mouth at night Monday, Wednesday and Friday.   glucose blood test strip 1 each by Other route 2 (two) times daily. Use as instructed-Check blood glucose prior to administering insulin   glucose blood test strip 1 each by Other route as needed for other. Use as instructed-Before Meals and At Bedtime   insulin aspart 100 UNIT/ML injection Commonly known as: novoLOG Inject into the skin. Per Sliding scale   insulin glargine 100 UNIT/ML injection Commonly known as: LANTUS Inject 34 Units into the skin every morning.   lactulose 10 GM/15ML solution Commonly known as: CHRONULAC Take 30 mLs (20 g total) by mouth 2 (two) times daily as needed for mild constipation. What changed: reasons to take this   latanoprost 0.005 % ophthalmic solution Commonly known as: XALATAN Place 1 drop into both eyes at bedtime.   levothyroxine 75 MCG tablet Commonly known as: SYNTHROID Take 1 tablet (75 mcg total) by mouth daily.   LORazepam 0.5 MG tablet Commonly known as: ATIVAN Take 0.5 mg by mouth at bedtime.   metFORMIN 500 MG 24 hr tablet Commonly known as: GLUCOPHAGE-XR Take 1 tablet (500 mg total) by mouth daily.   metoprolol succinate 25 MG 24 hr tablet Commonly known as: TOPROL-XL Take 12.5 mg by  mouth daily.   pantoprazole 40 MG tablet Commonly known as: PROTONIX Take 1 tablet (40 mg total) by mouth daily.   simvastatin 20 MG tablet Commonly known as: ZOCOR TAKE ONE TABLET BY MOUTH EVERY NIGHT AT BEDTIME   spironolactone 50 MG tablet Commonly known as: Aldactone Take 1 tablet (50 mg total) by mouth daily.   vitamin C 1000 MG tablet Take 1,000 mg by mouth at bedtime.   vitamin E 180 MG (400 UNITS) capsule Take 400 Units by mouth every evening.       Review of Systems  Review of Systems  Constitutional: Negative for activity change, appetite change, chills, diaphoresis, fatigue and fever.  HENT: Negative for mouth sores, postnasal drip, rhinorrhea, sinus pain and sore throat.   Respiratory: Negative for apnea, cough, chest tightness, shortness of breath and wheezing.   Cardiovascular: Negative for chest pain, palpitations and leg swelling.  Gastrointestinal: Negative for abdominal distention, abdominal pain, constipation, diarrhea, nausea and vomiting.  Genitourinary: Negative for dysuria and frequency.  Musculoskeletal: Negative for arthralgias, joint swelling and myalgias.  Skin:  Negative for rash.  Neurological: Negative for dizziness, syncope, weakness, light-headedness and numbness.  Psychiatric/Behavioral: Negative for behavioral problems, confusion and sleep disturbance.     Immunization History  Administered Date(s) Administered  . Fluad Quad(high Dose 65+) 10/27/2018, 02/09/2020  . Hepatitis B, adult 05/12/2014  . Hepatitis B, ped/adol 08/10/2014  . Influenza Split 11/02/2009, 10/15/2010  . Influenza, High Dose Seasonal PF 10/13/2014, 01/26/2017  . Influenza, Seasonal, Injecte, Preservative Fre 10/13/2014  . Influenza,inj,quad, With Preservative 11/22/2016  . Influenza-Unspecified 11/25/2013, 10/06/2015, 10/22/2017  . Moderna Sars-Covid-2 Vaccination 03/26/2019, 04/23/2019  . Pneumococcal Conjugate-13 05/17/2014  . Pneumococcal Polysaccharide-23  10/06/2015  . Tdap 10/02/2011, 11/06/2016  . Zoster 01/22/2005   Pertinent  Health Maintenance Due  Topic Date Due  . FOOT EXAM  05/12/2015  . COLONOSCOPY (Pts 45-67yrs Insurance coverage will need to be confirmed)  10/25/2015  . URINE MICROALBUMIN  04/27/2020  . OPHTHALMOLOGY EXAM  05/17/2020  . HEMOGLOBIN A1C  07/13/2020  . INFLUENZA VACCINE  Completed  . DEXA SCAN  Completed  . PNA vac Low Risk Adult  Addressed   Fall Risk  04/28/2019 01/09/2018 10/26/2016 10/09/2016 11/25/2015  Falls in the past year? 1 0 Yes No Yes  Number falls in past yr: 0 - 2 or more - 2 or more  Injury with Fall? 0 - No - Yes  Comment - - - - -  Risk Factor Category  - - High Fall Risk - High Fall Risk  Risk for fall due to : - - Impaired balance/gait - -  Follow up Falls evaluation completed - - - Falls prevention discussed   Functional Status Survey:    Vitals:   03/22/20 1033  BP: (!) 104/58  Pulse: 100  Resp: 18  Temp: 98 F (36.7 C)  SpO2: 98%  Weight: 178 lb (80.7 kg)  Height: 5\' 7"  (1.702 m)   Body mass index is 27.88 kg/m. Physical Exam  Constitutional: Oriented to person, place, and time. Well-developed and well-nourished.  HENT:  Head: Normocephalic.  Mouth/Throat: Oropharynx is clear and moist.  Eyes: Pupils are equal, round, and reactive to light.  Neck: Neck supple.  Cardiovascular: Normal rate and normal heart sounds.  No murmur heard. Pulmonary/Chest: Effort normal and breath sounds normal. No respiratory distress. No wheezes. She has no rales.  Abdominal: Soft. Bowel sounds are normal. No distension. There is no tenderness. There is no rebound.  Musculoskeletal Chronic Venous changes with Mild edema Bilateral Lymphadenopathy: none Neurological: Alert and oriented to person, place, and time. No Focal Deficits Skin: Skin is warm and dry.  Psychiatric: Normal mood and affect. Behavior is normal. Thought content normal.    Labs reviewed: Recent Labs    12/14/19 1113  01/12/20 1241 01/13/20 0610 01/14/20 0618 02/09/20 1409 02/18/20 0946 03/01/20 1336 03/17/20 0000  NA 137   < > 141   < > 139 135 134 139  K 3.6   < > 3.4*   < > 3.8 4.2 4.4 3.7  CL 99   < > 105   < > 103 101 96 103  CO2 24   < > 23   < > 30 27 23  28*  GLUCOSE 332*   < > 181*   < > 247* 228* 385*  --   BUN 13   < > 22   < > 22 26* 19 26*  CREATININE 1.04*   < > 1.11*   < > 1.36* 1.25* 1.18* 1.1  CALCIUM 8.8   < >  9.2   < > 9.8 9.2 9.4 9.1  MG 1.5*  --  2.2  --   --   --  1.6  --    < > = values in this interval not displayed.   Recent Labs    01/14/20 0614 01/21/20 1137 02/01/20 1446 03/17/20 0000  AST 36 32 40* 23  ALT 19 16 21 18   ALKPHOS 70 89 95 70  BILITOT 2.4* 1.8* 1.8*  --   PROT 5.5* 5.7* 6.1  --   ALBUMIN 3.3* 3.5 3.6 3.1*   Recent Labs    01/21/20 1137 02/01/20 1446 02/22/20 0930 03/17/20 0000  WBC 4.7 5.0 3.3* 2.6  NEUTROABS 3.4 3.7  --  1,604.00  HGB 9.2* 10.7* 8.5* 8.8*  HCT 30.6* 32.8* 26.6* 28*  MCV 85.5 85.3 90.5  --   PLT 53* 75.0* 58* 55*   Lab Results  Component Value Date   TSH 2.27 03/17/2020   Lab Results  Component Value Date   HGBA1C 7.6 (H) 01/13/2020   Lab Results  Component Value Date   CHOL 112 11/09/2019   HDL 46 (L) 11/09/2019   LDLCALC 52 11/09/2019   TRIG 61 11/09/2019   CHOLHDL 2.4 11/09/2019    Significant Diagnostic Results in last 30 days:  DG Chest 1 View  Result Date: 02/23/2020 CLINICAL DATA:  Post thoracentesis. EXAM: CHEST  1 VIEW COMPARISON:  02/09/2020. FINDINGS: Mediastinum and hilar structures normal. Heart size normal. Mild bibasilar subsegmental atelectasis. Near complete resolution of right pleural effusion. No pneumothorax. Mild thoracic spine scoliosis. IMPRESSION: Near complete resolution of right pleural effusion. No pneumothorax. Electronically Signed   By: Marcello Moores  Register   On: 02/23/2020 10:43   IR THORACENTESIS ASP PLEURAL SPACE W/IMG GUIDE  Result Date: 02/23/2020 INDICATION: Patient with  history of alcoholic cirrhosis, HF, dyspnea, and recurrent right pleural effusion. Request made for diagnostic and therapeutic right thoracentesis. EXAM: ULTRASOUND GUIDED DIAGNOSTIC AND THERAPEUTIC RIGHT THORACENTESIS MEDICATIONS: 9 mL 1% lidocaine COMPLICATIONS: None immediate. PROCEDURE: An ultrasound guided thoracentesis was thoroughly discussed with the patient and questions answered. The benefits, risks, alternatives and complications were also discussed. The patient understands and wishes to proceed with the procedure. Written consent was obtained. Ultrasound was performed to localize and mark an adequate pocket of fluid in the right chest. The area was then prepped and draped in the normal sterile fashion. 1% Lidocaine was used for local anesthesia. Under ultrasound guidance a 6 Fr Safe-T-Centesis catheter was introduced by [employer], PA-C. Thoracentesis was performed. The catheter was removed and a dressing applied. FINDINGS: A total of approximately 1 L of clear amber fluid was removed. Samples were sent to the laboratory as requested by the clinical team. IMPRESSION: Successful ultrasound guided right thoracentesis yielding 1 L of pleural fluid. Five Read by: Earley Abide, PA-C Electronically Signed   By: Jerilynn Mages.  Shick M.D.   On: 02/23/2020 10:56    Assessment/Plan Type 2 diabetes mellitus with hyperglycemia, with long-term current use of insulin (HCC) Change Lantus to 40 units QAM Check CBG TID Cover with Novolog Iron deficiency anemia due to chronic blood loss/ Pancytopenia Hematology Follow up Scheduled in few days  GERD without esophagitis On Protonix  Hypothyroidism, unspecified type TSH  Normal in 3/71 Chronic diastolic heart failure (HCC) On Lasix and Aldactone  Pleural effusion on right Due to her Cirrhosis Continue to monitor On Diuretics Anxiety On Ativan Alcoholic Cirrhosis On Lactulose and Aldactone Family/ staff Communication:   Labs/tests ordered:

## 2020-03-24 ENCOUNTER — Encounter: Payer: Self-pay | Admitting: Hematology & Oncology

## 2020-03-24 ENCOUNTER — Inpatient Hospital Stay: Payer: HMO | Attending: Hematology & Oncology

## 2020-03-24 ENCOUNTER — Telehealth: Payer: Self-pay

## 2020-03-24 ENCOUNTER — Other Ambulatory Visit: Payer: Self-pay

## 2020-03-24 ENCOUNTER — Inpatient Hospital Stay (HOSPITAL_BASED_OUTPATIENT_CLINIC_OR_DEPARTMENT_OTHER): Payer: HMO | Admitting: Hematology & Oncology

## 2020-03-24 VITALS — BP 114/64 | HR 117 | Temp 97.7°F | Resp 20 | Wt 174.8 lb

## 2020-03-24 DIAGNOSIS — K7460 Unspecified cirrhosis of liver: Secondary | ICD-10-CM | POA: Insufficient documentation

## 2020-03-24 DIAGNOSIS — D500 Iron deficiency anemia secondary to blood loss (chronic): Secondary | ICD-10-CM

## 2020-03-24 DIAGNOSIS — K922 Gastrointestinal hemorrhage, unspecified: Secondary | ICD-10-CM | POA: Diagnosis not present

## 2020-03-24 DIAGNOSIS — N1831 Chronic kidney disease, stage 3a: Secondary | ICD-10-CM | POA: Diagnosis not present

## 2020-03-24 DIAGNOSIS — R5383 Other fatigue: Secondary | ICD-10-CM | POA: Insufficient documentation

## 2020-03-24 DIAGNOSIS — R5381 Other malaise: Secondary | ICD-10-CM | POA: Insufficient documentation

## 2020-03-24 DIAGNOSIS — D631 Anemia in chronic kidney disease: Secondary | ICD-10-CM | POA: Diagnosis not present

## 2020-03-24 DIAGNOSIS — Z79899 Other long term (current) drug therapy: Secondary | ICD-10-CM | POA: Diagnosis not present

## 2020-03-24 DIAGNOSIS — D6959 Other secondary thrombocytopenia: Secondary | ICD-10-CM | POA: Diagnosis not present

## 2020-03-24 DIAGNOSIS — N183 Chronic kidney disease, stage 3 unspecified: Secondary | ICD-10-CM

## 2020-03-24 HISTORY — DX: Chronic kidney disease, stage 3 unspecified: N18.30

## 2020-03-24 HISTORY — DX: Anemia in chronic kidney disease: D63.1

## 2020-03-24 LAB — CBC WITH DIFFERENTIAL (CANCER CENTER ONLY)
Abs Immature Granulocytes: 0.06 10*3/uL (ref 0.00–0.07)
Basophils Absolute: 0 10*3/uL (ref 0.0–0.1)
Basophils Relative: 0 %
Eosinophils Absolute: 0.1 10*3/uL (ref 0.0–0.5)
Eosinophils Relative: 2 %
HCT: 29.7 % — ABNORMAL LOW (ref 36.0–46.0)
Hemoglobin: 9.3 g/dL — ABNORMAL LOW (ref 12.0–15.0)
Immature Granulocytes: 1 %
Lymphocytes Relative: 15 %
Lymphs Abs: 0.8 10*3/uL (ref 0.7–4.0)
MCH: 27.8 pg (ref 26.0–34.0)
MCHC: 31.3 g/dL (ref 30.0–36.0)
MCV: 88.9 fL (ref 80.0–100.0)
Monocytes Absolute: 0.6 10*3/uL (ref 0.1–1.0)
Monocytes Relative: 11 %
Neutro Abs: 3.9 10*3/uL (ref 1.7–7.7)
Neutrophils Relative %: 71 %
Platelet Count: 77 10*3/uL — ABNORMAL LOW (ref 150–400)
RBC: 3.34 MIL/uL — ABNORMAL LOW (ref 3.87–5.11)
RDW: 16.4 % — ABNORMAL HIGH (ref 11.5–15.5)
WBC Count: 5.5 10*3/uL (ref 4.0–10.5)
nRBC: 0 % (ref 0.0–0.2)

## 2020-03-24 LAB — SAMPLE TO BLOOD BANK

## 2020-03-24 LAB — CMP (CANCER CENTER ONLY)
ALT: 16 U/L (ref 0–44)
AST: 26 U/L (ref 15–41)
Albumin: 3.6 g/dL (ref 3.5–5.0)
Alkaline Phosphatase: 75 U/L (ref 38–126)
Anion gap: 6 (ref 5–15)
BUN: 24 mg/dL — ABNORMAL HIGH (ref 8–23)
CO2: 31 mmol/L (ref 22–32)
Calcium: 10.2 mg/dL (ref 8.9–10.3)
Chloride: 98 mmol/L (ref 98–111)
Creatinine: 1.31 mg/dL — ABNORMAL HIGH (ref 0.44–1.00)
GFR, Estimated: 40 mL/min — ABNORMAL LOW (ref >60)
Glucose, Bld: 242 mg/dL — ABNORMAL HIGH (ref 70–99)
Potassium: 4.4 mmol/L (ref 3.5–5.1)
Sodium: 135 mmol/L (ref 135–145)
Total Bilirubin: 1.1 mg/dL (ref 0.3–1.2)
Total Protein: 5.9 g/dL — ABNORMAL LOW (ref 6.5–8.1)

## 2020-03-24 LAB — SAVE SMEAR(SSMR), FOR PROVIDER SLIDE REVIEW

## 2020-03-24 LAB — RETICULOCYTES
Immature Retic Fract: 20.9 % — ABNORMAL HIGH (ref 2.3–15.9)
RBC.: 3.37 MIL/uL — ABNORMAL LOW (ref 3.87–5.11)
Retic Count, Absolute: 108.9 10*3/uL (ref 19.0–186.0)
Retic Ct Pct: 3.2 % — ABNORMAL HIGH (ref 0.4–3.1)

## 2020-03-24 NOTE — Telephone Encounter (Signed)
appts made and printed for pt per 03/24/20 los   [employer]

## 2020-03-24 NOTE — Addendum Note (Signed)
Addended by: Volanda Napoleon on: 03/24/2020 06:21 PM   Modules accepted: Orders

## 2020-03-24 NOTE — Progress Notes (Signed)
Hematology and Oncology Follow Up Visit  Jessica Dennis [id number] [birth year] 87 y.o. 03/24/2020   Principle Diagnosis:  Chronic thrombocytopenia secondary to cirrhosis and splenomegaly Iron deficiency anemia secondary to GI bleed  Current Therapy:   IV iron as indicated Red blood cell transfusion as needed for variceal bleeding   Interim History:  Jessica Dennis is here today for follow-up.  She seems to be doing fairly well.  She is at assisted living.  She is still somewhat weak.  She is eating a little better.  She is not having any obvious bleeding.  Her vital pain has been any problems with respect to ascites or pleural effusions.  She does have the cirrhosis.  This is from NASH.  Her blood sugars today are on the high side.  She does have a somewhat high erythropoietin level.  Will we checked it in December it was 280.  We still might want to give IS a chance.  Her iron studies when we last saw her showed a ferritin of 129 with an iron saturation of 61%.  She has had no fever.  She has had no cough.  She has had no rashes.  There is been of some leg swelling but this is chronic.  Overall, her performance status is ECOG 2.  Medications:  Allergies as of 03/24/2020      Reactions   Tizanidine Other (See Comments)   Hallucinate, confused       Medication List       Accurate as of March 24, 2020  3:39 PM. If you have any questions, ask your nurse or doctor.        Accu-Chek FastClix Lancets Misc CHECK FOR BLOOD SUGAR DAILY   acetaminophen 325 MG tablet Commonly known as: TYLENOL Take 650 mg by mouth every 6 (six) hours as needed.   folic acid 1 MG tablet Commonly known as: FOLVITE Take 1 tablet (1 mg total) by mouth daily.   furosemide 40 MG tablet Commonly known as: Lasix Take 1 tablet (40 mg total) by mouth in the morning.   furosemide 20 MG tablet Commonly known as: LASIX Take one by mouth at night Monday, Wednesday and Friday.   glucose blood test  strip 1 each by Other route 2 (two) times daily. Use as instructed-Check blood glucose prior to administering insulin   insulin aspart 100 UNIT/ML injection Commonly known as: novoLOG Inject into the skin. Per Sliding scale   insulin glargine 100 UNIT/ML injection Commonly known as: LANTUS Inject 35 Units into the skin every morning.   lactulose 10 GM/15ML solution Commonly known as: CHRONULAC Take 30 mLs (20 g total) by mouth 2 (two) times daily as needed for mild constipation. What changed: reasons to take this   latanoprost 0.005 % ophthalmic solution Commonly known as: XALATAN Place 1 drop into both eyes at bedtime.   levothyroxine 75 MCG tablet Commonly known as: SYNTHROID Take 1 tablet (75 mcg total) by mouth daily.   LORazepam 0.5 MG tablet Commonly known as: ATIVAN Take 0.5 mg by mouth at bedtime.   metFORMIN 500 MG 24 hr tablet Commonly known as: GLUCOPHAGE-XR Take 1 tablet (500 mg total) by mouth daily.   metoprolol succinate 25 MG 24 hr tablet Commonly known as: TOPROL-XL Take 12.5 mg by mouth daily.   pantoprazole 40 MG tablet Commonly known as: PROTONIX Take 1 tablet (40 mg total) by mouth daily.   simvastatin 20 MG tablet Commonly known as: ZOCOR TAKE ONE TABLET BY MOUTH EVERY  NIGHT AT BEDTIME   spironolactone 50 MG tablet Commonly known as: Aldactone Take 1 tablet (50 mg total) by mouth daily.   vitamin C 1000 MG tablet Take 1,000 mg by mouth at bedtime.   vitamin E 180 MG (400 UNITS) capsule Take 400 Units by mouth every evening.       Allergies:  Allergies  Allergen Reactions  . Tizanidine Other (See Comments)    Hallucinate, confused     Past Medical History, Surgical history, Social history, and Family History were reviewed and updated.  Review of Systems: Review of Systems  Constitutional: Positive for malaise/fatigue.  HENT: Negative.   Eyes: Negative.   Respiratory: Positive for shortness of breath.   Cardiovascular:  Positive for chest pain, palpitations, orthopnea and leg swelling.  Gastrointestinal: Negative.   Genitourinary: Negative.   Musculoskeletal: Negative.   Skin: Negative.   Neurological: Negative.   Endo/Heme/Allergies: Negative.   Psychiatric/Behavioral: Negative.      Physical Exam:  weight is 174 lb 12.8 oz (79.3 kg). Her oral temperature is 97.7 F (36.5 C). Her blood pressure is 114/64 and her pulse is 117 (abnormal). Her respiration is 20 and oxygen saturation is 99%.   Wt Readings from Last 3 Encounters:  03/24/20 174 lb 12.8 oz (79.3 kg)  03/22/20 178 lb (80.7 kg)  03/14/20 178 lb (80.7 kg)    Physical Exam Vitals reviewed.  HENT:     Head: Normocephalic and atraumatic.  Eyes:     Pupils: Pupils are equal, round, and reactive to light.  Cardiovascular:     Rate and Rhythm: Normal rate and regular rhythm.     Heart sounds: Normal heart sounds.  Pulmonary:     Effort: Pulmonary effort is normal.     Breath sounds: Normal breath sounds.  Abdominal:     General: Bowel sounds are normal.     Palpations: Abdomen is soft.  Musculoskeletal:        General: No tenderness or deformity. Normal range of motion.     Cervical back: Normal range of motion.  Lymphadenopathy:     Cervical: No cervical adenopathy.  Skin:    General: Skin is warm and dry.     Findings: No erythema or rash.  Neurological:     Mental Status: She is alert and oriented to person, place, and time.  Psychiatric:        Behavior: Behavior normal.        Thought Content: Thought content normal.        Judgment: Judgment normal.      Lab Results  Component Value Date   WBC 5.5 03/24/2020   HGB 9.3 (L) 03/24/2020   HCT 29.7 (L) 03/24/2020   MCV 88.9 03/24/2020   PLT 77 (L) 03/24/2020   Lab Results  Component Value Date   FERRITIN 19 02/22/2020   IRON 144 (H) 01/21/2020   TIBC 238 01/21/2020   UIBC 94 (L) 01/21/2020   IRONPCTSAT 61 (H) 01/21/2020   Lab Results  Component Value Date    RETICCTPCT 3.2 (H) 03/24/2020   RBC 3.34 (L) 03/24/2020   RBC 3.37 (L) 03/24/2020   No results found for: KPAFRELGTCHN, LAMBDASER, KAPLAMBRATIO No results found for: IGGSERUM, IGA, IGMSERUM No results found for: TOTALPROTELP, ALBUMINELP, A1GS, A2GS, BETS, BETA2SER, GAMS, MSPIKE, SPEI   Chemistry      Component Value Date/Time   NA 135 03/24/2020 1452   NA 139 03/17/2020 0000   NA 138 12/18/2016 1314   NA 139  03/05/2016 1139   K 4.4 03/24/2020 1452   K 4.3 12/18/2016 1314   K 3.6 03/05/2016 1139   CL 98 03/24/2020 1452   CL 101 12/18/2016 1314   CO2 31 03/24/2020 1452   CO2 28 12/18/2016 1314   CO2 26 03/05/2016 1139   BUN 24 (H) 03/24/2020 1452   BUN 26 (A) 03/17/2020 0000   BUN 33 (H) 12/18/2016 1314   BUN 14.3 03/05/2016 1139   CREATININE 1.31 (H) 03/24/2020 1452   CREATININE 1.02 (H) 11/09/2019 1137   CREATININE 0.7 03/05/2016 1139   GLU 172 03/17/2020 0000      Component Value Date/Time   CALCIUM 10.2 03/24/2020 1452   CALCIUM 10.0 12/18/2016 1314   CALCIUM 9.3 03/05/2016 1139   ALKPHOS 75 03/24/2020 1452   ALKPHOS 94 (H) 12/18/2016 1314   ALKPHOS 127 03/05/2016 1139   AST 26 03/24/2020 1452   AST 29 03/05/2016 1139   ALT 16 03/24/2020 1452   ALT 26 12/18/2016 1314   ALT 22 03/05/2016 1139   BILITOT 1.1 03/24/2020 1452   BILITOT 1.75 (H) 03/05/2016 1139       Impression and Plan: Ms. Burdett is a very pleasant27 yo female with chronic thrombocytopenia and iron deficiency anemia secondary to intermittent GI blood loss.  We will see what her iron studies look like.  If her iron studies are okay, we still might want to give ESA a try.  I am just glad that she seems to be holding her own right now.  We have to follow this quite closely.  I will plan to see her back in another 3-4 weeks.    Volanda Napoleon, MD 3/3/20223:39 PM

## 2020-03-25 ENCOUNTER — Telehealth: Payer: Self-pay | Admitting: *Deleted

## 2020-03-25 LAB — IRON AND TIBC
Iron: 57 ug/dL (ref 41–142)
Saturation Ratios: 18 % — ABNORMAL LOW (ref 21–57)
TIBC: 319 ug/dL (ref 236–444)
UIBC: 262 ug/dL (ref 120–384)

## 2020-03-25 LAB — FERRITIN: Ferritin: 25 ng/mL (ref 11–307)

## 2020-03-25 NOTE — Telephone Encounter (Signed)
Scheduling message sent for patient to get IV iron next week per Dr Marin Olp.

## 2020-03-25 NOTE — Telephone Encounter (Signed)
-----   Message from Volanda Napoleon, MD sent at 03/25/2020 11:57 AM EST ----- Call - the iron is low.  She needs IV Iron next week!!  Laurey Arrow

## 2020-03-29 ENCOUNTER — Encounter: Payer: Self-pay | Admitting: Nurse Practitioner

## 2020-03-29 ENCOUNTER — Non-Acute Institutional Stay (INDEPENDENT_AMBULATORY_CARE_PROVIDER_SITE_OTHER): Payer: HMO | Admitting: Nurse Practitioner

## 2020-03-29 DIAGNOSIS — E871 Hypo-osmolality and hyponatremia: Secondary | ICD-10-CM | POA: Diagnosis not present

## 2020-03-29 DIAGNOSIS — I482 Chronic atrial fibrillation, unspecified: Secondary | ICD-10-CM

## 2020-03-29 DIAGNOSIS — K7031 Alcoholic cirrhosis of liver with ascites: Secondary | ICD-10-CM

## 2020-03-29 DIAGNOSIS — I5033 Acute on chronic diastolic (congestive) heart failure: Secondary | ICD-10-CM

## 2020-03-29 DIAGNOSIS — E1165 Type 2 diabetes mellitus with hyperglycemia: Secondary | ICD-10-CM | POA: Diagnosis not present

## 2020-03-29 DIAGNOSIS — F419 Anxiety disorder, unspecified: Secondary | ICD-10-CM

## 2020-03-29 DIAGNOSIS — I10 Essential (primary) hypertension: Secondary | ICD-10-CM

## 2020-03-29 DIAGNOSIS — D509 Iron deficiency anemia, unspecified: Secondary | ICD-10-CM

## 2020-03-29 DIAGNOSIS — Z794 Long term (current) use of insulin: Secondary | ICD-10-CM

## 2020-03-29 DIAGNOSIS — J90 Pleural effusion, not elsewhere classified: Secondary | ICD-10-CM

## 2020-03-29 DIAGNOSIS — E039 Hypothyroidism, unspecified: Secondary | ICD-10-CM

## 2020-03-29 DIAGNOSIS — K219 Gastro-esophageal reflux disease without esophagitis: Secondary | ICD-10-CM

## 2020-03-29 NOTE — Assessment & Plan Note (Signed)
last seen by pulmonology 03/01/20. Last hospitalization 12/2019, underwent thoracentesis 01/13/21 and 2 litter fluid removed, negative culture, probably a transudate from liver cirrhosis 

## 2020-03-29 NOTE — Assessment & Plan Note (Signed)
takes Folic acid. Gastric antral vascular ectasia. Iron infusion 03/04/20. S/p transfused 12/2019 while in hospital. Hgb 9.3 03/24/20.

## 2020-03-29 NOTE — Assessment & Plan Note (Signed)
CHF/Edema chronic, Takes Spironolactone, Furosemide. EF 60-65%. Bun/creat 24/1.31 03/24/20  

## 2020-03-29 NOTE — Assessment & Plan Note (Signed)
takes Levothyroxine 75mcg qd, TSH 2.27 03/17/20 

## 2020-03-29 NOTE — Assessment & Plan Note (Signed)
Blood pressure is controlled, continue Metoprolol. 

## 2020-03-29 NOTE — Progress Notes (Signed)
Location:   [place] Room Number: [place]:  ALF [phone]) Provider:  Mast, Lennie Odor NP  Virgie Dad, MD  Patient Care Team: Virgie Dad, MD as PCP - General (Internal Medicine) Berniece Salines, DO as PCP - Cardiology (Cardiology) Sheryn Bison, MD as Referring Physician (Dermatology) Gatha Mayer, MD as Consulting Physician (Gastroenterology) Marin Olp Rudell Cobb, MD as Consulting Physician (Oncology) Janan Ridge, MD as Consulting Physician (Dermatology) Paralee Cancel, MD as Consulting Physician (Orthopedic Surgery) Jacelyn Pi, MD as Consulting Physician (Endocrinology) Day, Melvenia Beam, [facility] (Inactive) as Pharmacist (Pharmacist) Berniece Salines, DO as Consulting Physician (Cardiology)  Extended Emergency Contact Information Primary Emergency Contact: Trinidad Curet [place] Phone: [phone] Mobile Phone: [phone] Relation: Son Secondary Emergency Contact: Madhavi, [place] Mobile Phone: [phone] Relation: Relative Preferred language: English Interpreter needed? No  Code Status:  DNR Goals of care: Advanced Directive information Advanced Directives 03/24/2020  Does Patient Have a Medical Advance Directive? Yes  Type of Paramedic of [place];Living will  Does patient want to make changes to medical advance directive? -  Copy of [place] in Chart? -  Would patient like information on creating a medical advance directive? No - Patient declined  Pre-existing out of facility DNR order (yellow form or pink MOST form) -     Chief Complaint  Patient presents with  . Acute Visit    Blood sugar    HPI:  Pt is a 87 y.o. female seen today for an acute visit for elevated blood sugar.   Last Hgb a1c 7.6 12/2019. The patient admitted she is in her usual state of health. Takes Metformin 500mg  qd.             Type 2 diabetes, chronic insulin dependent  Lantus 40 u qam Novolog  12 u if CBG >250 03/22/20  Lantus 10u qhs 03/25/20  CBG am 199-321 in am since 03/26/20             CHF/Edema chronic, Takes Spironolactone,  Furosemide.  EF 60-65%. Bun/creat  24/1.31 03/24/20  Hyponatremia, Na 135 03/24/20             HTN, takes Metoprolol             Afib, asymptomatic, HR 110 bpm, f/u Cardiology.              GERD, takes Pantoprazole, f/u GI, gastric antral vascular ectasia. Hgb 9.3 03/24/20             Hypothyroidism, takes Levothyroxine 72mcg qd, TSH 2.27 03/17/20             Anxiety, take Lorazepam 0.5mg  qhs             Pleural effusion, last seen by pulmonology 03/01/20. Last hospitalization 12/2019, underwent thoracentesis 01/13/21 and 2 litter fluid removed, negative culture, probably a transudate from liver cirrhosis   Anemia, takes Folic acid. Gastric antral vascular ectasia. Iron infusion 03/04/20. S/p transfused 12/2019 while in hospital. Hgb 9.3 03/24/20             Alcoholic cirrhosis with ascites/portal hypertensive gastropathy                 Past Medical History:  Diagnosis Date  . Anemia   . Anemia of chronic renal failure, stage 3 (moderate) ([place]) 03/24/2020  . Angiodysplasia of ascending colon 10/25/2014  . Anxiety   . Arthritis   . Borderline diabetes   .  Cancer of the skin, basal cell 09/03/2012  . Cirrhosis ([place])   . Diabetes mellitus without complication ([place])   . Diverticular disease   . 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 GERD (gastroesophageal reflux disease)   . Hypertension   . Iron deficiency anemia due to chronic blood loss 01/19/2015  . Portal hypertensive gastropathy ([place]) 08/02/2016   ? Some GAVE also  . Thyroid disease    Past Surgical History:  Procedure Laterality Date  . ABDOMINAL HYSTERECTOMY    . APPENDECTOMY    . COLONOSCOPY    . ESOPHAGOGASTRODUODENOSCOPY    . ESOPHAGOGASTRODUODENOSCOPY (EGD) WITH PROPOFOL N/A 02/22/2020   Procedure: ESOPHAGOGASTRODUODENOSCOPY (EGD) WITH PROPOFOL;  Surgeon: Gatha Mayer, MD;   Location: [facility];  Service: Endoscopy;  Laterality: N/A;  . IR PARACENTESIS  05/25/2016  . IR THORACENTESIS ASP PLEURAL SPACE W/IMG GUIDE  02/23/2020  . LEG SKIN LESION  BIOPSY / EXCISION  12/11/14  . MOHS SURGERY     ankle  . TONSILLECTOMY    . TOTAL HIP ARTHROPLASTY Bilateral 1993, 2006  . UPPER GASTROINTESTINAL ENDOSCOPY  09/09/2017    Allergies  Allergen Reactions  . Tizanidine Other (See Comments)    Hallucinate, confused     Allergies as of 03/29/2020      Reactions   Tizanidine Other (See Comments)   Hallucinate, confused       Medication List       Accurate as of March 29, 2020 11:59 PM. If you have any questions, ask your nurse or doctor.        Accu-Chek FastClix Lancets Misc CHECK FOR BLOOD SUGAR DAILY   acetaminophen 325 MG tablet Commonly known as: TYLENOL Take 650 mg by mouth every 6 (six) hours as needed.   folic acid 1 MG tablet Commonly known as: FOLVITE Take 1 tablet (1 mg total) by mouth daily.   furosemide 40 MG tablet Commonly known as: Lasix Take 1 tablet (40 mg total) by mouth in the morning.   furosemide 20 MG tablet Commonly known as: LASIX Take one by mouth at night Monday, Wednesday and Friday.   glucose blood test strip 1 each by Other route 2 (two) times daily. Use as instructed-Check blood glucose prior to administering insulin   insulin aspart 100 UNIT/ML injection Commonly known as: novoLOG Inject into the skin. Per Sliding scale   insulin glargine 100 UNIT/ML injection Commonly known as: LANTUS Inject 40 Units into the skin every morning.   lactulose 10 GM/15ML solution Commonly known as: CHRONULAC Take by mouth daily. What changed: Another medication with the same name was removed. Continue taking this medication, and follow the directions you see here. Changed by: Man X Mast, NP   latanoprost 0.005 % ophthalmic solution Commonly known as: XALATAN Place 1 drop into both eyes at bedtime.   levothyroxine 75 MCG  tablet Commonly known as: SYNTHROID Take 1 tablet (75 mcg total) by mouth daily.   LORazepam 0.5 MG tablet Commonly known as: ATIVAN Take 0.5 mg by mouth at bedtime.   metFORMIN 500 MG 24 hr tablet Commonly known as: GLUCOPHAGE-XR Take 1 tablet (500 mg total) by mouth daily.   metoprolol succinate 25 MG 24 hr tablet Commonly known as: TOPROL-XL Take 12.5 mg by mouth daily.   pantoprazole 40 MG tablet Commonly known as: PROTONIX Take 1 tablet (40 mg total) by mouth daily.   simvastatin 20 MG tablet Commonly known as: ZOCOR TAKE ONE TABLET BY MOUTH EVERY NIGHT AT BEDTIME  spironolactone 50 MG tablet Commonly known as: Aldactone Take 1 tablet (50 mg total) by mouth daily.   vitamin C 1000 MG tablet Take 1,000 mg by mouth at bedtime.   vitamin E 180 MG (400 UNITS) capsule Take 400 Units by mouth every evening.       Review of Systems  Constitutional: Negative for activity change, appetite change and fever.  HENT: Positive for hearing loss. Negative for congestion and trouble swallowing.   Eyes: Negative for visual disturbance.  Respiratory: Negative for cough and shortness of breath.   Cardiovascular: Positive for leg swelling. Negative for chest pain and palpitations.  Gastrointestinal: Negative for abdominal pain, constipation and vomiting.  Genitourinary: Negative for difficulty urinating, dysuria and urgency.  Musculoskeletal: Positive for arthralgias. Negative for gait problem.  Skin: Negative for color change.  Neurological: Negative for speech difficulty, weakness and light-headedness.  Psychiatric/Behavioral: Negative for behavioral problems, hallucinations and sleep disturbance. The patient is not nervous/anxious.     Immunization History  Administered Date(s) Administered  . Fluad Quad(high Dose 65+) 10/27/2018, 02/09/2020  . Hepatitis B, adult 05/12/2014  . Hepatitis B, ped/adol 08/10/2014  . Influenza Split 11/02/2009, 10/15/2010  . Influenza, High  Dose Seasonal PF 10/13/2014, 01/26/2017  . Influenza, Quadrivalent, Recombinant, Inj, Pf 09/30/2017  . Influenza, Seasonal, Injecte, Preservative Fre 10/13/2014  . Influenza,inj,quad, With Preservative 11/22/2016  . Influenza-Unspecified 11/25/2013, 10/06/2015, 10/22/2017  . Moderna Sars-Covid-2 Vaccination 03/26/2019, 04/23/2019  . Pneumococcal Conjugate-13 05/17/2014  . Pneumococcal Polysaccharide-23 10/06/2015  . Tdap 10/02/2011, 11/06/2016  . Zoster 01/22/2005   Pertinent  Health Maintenance Due  Topic Date Due  . FOOT EXAM  05/12/2015  . COLONOSCOPY (Pts 45-41yrs Insurance coverage will need to be confirmed)  10/25/2015  . URINE MICROALBUMIN  04/27/2020  . OPHTHALMOLOGY EXAM  05/17/2020  . HEMOGLOBIN A1C  07/13/2020  . INFLUENZA VACCINE  Completed  . DEXA SCAN  Completed  . PNA vac Low Risk Adult  Addressed   Fall Risk  04/28/2019 01/09/2018 10/26/2016 10/09/2016 11/25/2015  Falls in the past year? 1 0 Yes No Yes  Number falls in past yr: 0 - 2 or more - 2 or more  Injury with Fall? 0 - No - Yes  Comment - - - - -  Risk Factor Category  - - High Fall Risk - High Fall Risk  Risk for fall due to : - - Impaired balance/gait - -  Follow up Falls evaluation completed - - - Falls prevention discussed   Functional Status Survey:    Vitals:   03/29/20 0958  BP: 120/68  Pulse: (!) 112  Resp: 20  Temp: 98.9 F (37.2 C)  SpO2: 93%  Weight: 172 lb 6.4 oz (78.2 kg)  Height: 5\' 7"  (1.702 m)   Body mass index is 27 kg/m. Physical Exam Vitals and nursing note reviewed.  Constitutional:      Appearance: Normal appearance.  HENT:     Head: Normocephalic and atraumatic.     Mouth/Throat:     Mouth: Mucous membranes are moist.  Eyes:     Extraocular Movements: Extraocular movements intact.     Conjunctiva/sclera: Conjunctivae normal.     Pupils: Pupils are equal, round, and reactive to light.  Cardiovascular:     Rate and Rhythm: Tachycardia present. Rhythm irregular.      Heart sounds: No murmur heard.   Pulmonary:     Breath sounds: Rales present.     Comments: bibasilar Abdominal:     General: Bowel sounds are  normal.     Palpations: Abdomen is soft.     Tenderness: There is no abdominal tenderness.  Musculoskeletal:     Cervical back: Normal range of motion and neck supple.     Right lower leg: Edema present.     Left lower leg: Edema present.     Comments: Trace edema BLE. C/o lower back at night.   Skin:    General: Skin is warm and dry.  Neurological:     General: No focal deficit present.     Mental Status: She is alert and oriented to person, place, and time. Mental status is at baseline.  Psychiatric:        Mood and Affect: Mood normal.        Behavior: Behavior normal.        Thought Content: Thought content normal.        Judgment: Judgment normal.     Labs reviewed: Recent Labs    12/14/19 1113 01/12/20 1241 01/13/20 0610 01/14/20 0618 02/18/20 0946 03/01/20 1336 03/17/20 0000 03/24/20 1452  NA 137   < > 141   < > 135 134 139 135  K 3.6   < > 3.4*   < > 4.2 4.4 3.7 4.4  CL 99   < > 105   < > 101 96 103 98  CO2 24   < > 23   < > 27 23 28* 31  GLUCOSE 332*   < > 181*   < > 228* 385*  --  242*  BUN 13   < > 22   < > 26* 19 26* 24*  CREATININE 1.04*   < > 1.11*   < > 1.25* 1.18* 1.1 1.31*  CALCIUM 8.8   < > 9.2   < > 9.2 9.4 9.1 10.2  MG 1.5*  --  2.2  --   --  1.6  --   --    < > = values in this interval not displayed.   Recent Labs    01/21/20 1137 02/01/20 1446 03/17/20 0000 03/24/20 1452  AST 32 40* 23 26  ALT 16 21 18 16   ALKPHOS 89 95 70 75  BILITOT 1.8* 1.8*  --  1.1  PROT 5.7* 6.1  --  5.9*  ALBUMIN 3.5 3.6 3.1* 3.6   Recent Labs    02/01/20 1446 02/22/20 0930 03/17/20 0000 03/24/20 1452  WBC 5.0 3.3* 2.6 5.5  NEUTROABS 3.7  --  1,604.00 3.9  HGB 10.7* 8.5* 8.8* 9.3*  HCT 32.8* 26.6* 28* 29.7*  MCV 85.3 90.5  --  88.9  PLT 75.0* 58* 55* 77*   Lab Results  Component Value Date   TSH 2.27  03/17/2020   Lab Results  Component Value Date   HGBA1C 7.6 (H) 01/13/2020   Lab Results  Component Value Date   CHOL 112 11/09/2019   HDL 46 (L) 11/09/2019   LDLCALC 52 11/09/2019   TRIG 61 11/09/2019   CHOLHDL 2.4 11/09/2019    Significant Diagnostic Results in last 30 days:  No results found.  Assessment/Plan Hyperglycemia due to type 2 diabetes mellitus (HCC)  Last Hgb a1c 7.6 12/2019. The patient admitted she is in her usual state of health. Takes Metformin 500mg  qd.             Type 2 diabetes, chronic insulin dependent  Lantus 40 u qam Novolog 12 u if CBG >250 03/22/20  Lantus 10u qhs 03/25/20  CBG am 199-321 in  am since 03/26/20  Will increase Lantus 14 u SQ qhs for elevated CBG in am, continue am Lantus, Novolog for CBG>250.   Acute on chronic diastolic CHF (congestive heart failure) (HCC) CHF/Edema chronic, Takes Spironolactone,  Furosemide.  EF 60-65%. Bun/creat  24/1.31 03/24/20   Hyponatremia Na 135 03/24/20   Hypertension Blood pressure is controlled, continue Metoprolol   Chronic atrial fibrillation (HCC) HR in 100-110s at times, asymptomatic, f/u cardiology  GERD (gastroesophageal reflux disease)  takes Pantoprazole, f/u GI, gastric antral vascular ectasia. Hgb 9.3 03/24/20  Adult hypothyroidism  takes Levothyroxine 63mcg qd, TSH 2.27 03/17/20   Anxiety  take Lorazepam 0.5mg  qhs   Pleural effusion on right last seen by pulmonology 03/01/20. Last hospitalization 12/2019, underwent thoracentesis 01/13/21 and 2 litter fluid removed, negative culture, probably a transudate from liver cirrhosis   Anemia, unspecified takes Folic acid. Gastric antral vascular ectasia. Iron infusion 03/04/20. S/p transfused 12/2019 while in hospital. Hgb 9.3 03/24/20.    Alcoholic cirrhosis of liver with ascites (HCC) Alcoholic cirrhosis with ascites/portal hypertensive gastropathy     Family/ staff Communication: plan of care reviewed with the patient and charge nurse.    Labs/tests ordered:  none  Time spend 40 minutes.

## 2020-03-29 NOTE — Assessment & Plan Note (Signed)
Na 135 03/24/20

## 2020-03-29 NOTE — Assessment & Plan Note (Signed)
takes Pantoprazole, f/u GI, gastric antral vascular ectasia. Hgb 9.3 03/24/20  

## 2020-03-29 NOTE — Assessment & Plan Note (Signed)
HR in 100-110s at times, asymptomatic, f/u cardiology

## 2020-03-29 NOTE — Assessment & Plan Note (Signed)
Alcoholic cirrhosis with ascites/portal hypertensive gastropathy

## 2020-03-29 NOTE — Assessment & Plan Note (Signed)
take Lorazepam 0.5mg qhs 

## 2020-03-29 NOTE — Assessment & Plan Note (Signed)
Last Hgb a1c 7.6 12/2019. The patient admitted she is in her usual state of health. Takes Metformin 500mg  qd.             Type 2 diabetes, chronic insulin dependent  Lantus 40 u qam Novolog 12 u if CBG >250 03/22/20  Lantus 10u qhs 03/25/20  CBG am 199-321 in am since 03/26/20  Will increase Lantus 14 u SQ qhs for elevated CBG in am, continue am Lantus, Novolog for CBG>250.

## 2020-04-01 ENCOUNTER — Inpatient Hospital Stay: Payer: HMO

## 2020-04-01 ENCOUNTER — Other Ambulatory Visit: Payer: Self-pay

## 2020-04-01 ENCOUNTER — Encounter: Payer: Self-pay | Admitting: Nurse Practitioner

## 2020-04-01 VITALS — BP 120/70 | HR 114 | Temp 99.9°F | Resp 18

## 2020-04-01 DIAGNOSIS — R195 Other fecal abnormalities: Secondary | ICD-10-CM

## 2020-04-01 DIAGNOSIS — D500 Iron deficiency anemia secondary to blood loss (chronic): Secondary | ICD-10-CM | POA: Diagnosis not present

## 2020-04-01 DIAGNOSIS — K219 Gastro-esophageal reflux disease without esophagitis: Secondary | ICD-10-CM

## 2020-04-01 DIAGNOSIS — K5520 Angiodysplasia of colon without hemorrhage: Secondary | ICD-10-CM

## 2020-04-01 MED ORDER — SODIUM CHLORIDE 0.9 % IV SOLN
200.0000 mg | Freq: Once | INTRAVENOUS | Status: AC
  Administered 2020-04-01: 200 mg via INTRAVENOUS
  Filled 2020-04-01: qty 200

## 2020-04-01 MED ORDER — SODIUM CHLORIDE 0.9 % IV SOLN
Freq: Once | INTRAVENOUS | Status: AC
  Filled 2020-04-01: qty 250

## 2020-04-01 NOTE — Patient Instructions (Signed)

## 2020-04-11 ENCOUNTER — Non-Acute Institutional Stay: Payer: HMO | Admitting: Nurse Practitioner

## 2020-04-11 DIAGNOSIS — D509 Iron deficiency anemia, unspecified: Secondary | ICD-10-CM | POA: Diagnosis not present

## 2020-04-11 DIAGNOSIS — K7031 Alcoholic cirrhosis of liver with ascites: Secondary | ICD-10-CM | POA: Diagnosis not present

## 2020-04-11 DIAGNOSIS — I482 Chronic atrial fibrillation, unspecified: Secondary | ICD-10-CM

## 2020-04-11 DIAGNOSIS — E1165 Type 2 diabetes mellitus with hyperglycemia: Secondary | ICD-10-CM

## 2020-04-11 DIAGNOSIS — F419 Anxiety disorder, unspecified: Secondary | ICD-10-CM

## 2020-04-11 DIAGNOSIS — I5033 Acute on chronic diastolic (congestive) heart failure: Secondary | ICD-10-CM

## 2020-04-11 DIAGNOSIS — K219 Gastro-esophageal reflux disease without esophagitis: Secondary | ICD-10-CM

## 2020-04-11 DIAGNOSIS — E039 Hypothyroidism, unspecified: Secondary | ICD-10-CM

## 2020-04-11 DIAGNOSIS — J90 Pleural effusion, not elsewhere classified: Secondary | ICD-10-CM

## 2020-04-11 DIAGNOSIS — I10 Essential (primary) hypertension: Secondary | ICD-10-CM

## 2020-04-11 DIAGNOSIS — E871 Hypo-osmolality and hyponatremia: Secondary | ICD-10-CM

## 2020-04-11 DIAGNOSIS — Z794 Long term (current) use of insulin: Secondary | ICD-10-CM

## 2020-04-12 ENCOUNTER — Encounter: Payer: Self-pay | Admitting: Nurse Practitioner

## 2020-04-12 ENCOUNTER — Non-Acute Institutional Stay: Payer: HMO | Admitting: Internal Medicine

## 2020-04-12 ENCOUNTER — Encounter: Payer: Self-pay | Admitting: Internal Medicine

## 2020-04-12 DIAGNOSIS — I10 Essential (primary) hypertension: Secondary | ICD-10-CM

## 2020-04-12 DIAGNOSIS — E039 Hypothyroidism, unspecified: Secondary | ICD-10-CM | POA: Diagnosis not present

## 2020-04-12 DIAGNOSIS — E1165 Type 2 diabetes mellitus with hyperglycemia: Secondary | ICD-10-CM

## 2020-04-12 DIAGNOSIS — K7031 Alcoholic cirrhosis of liver with ascites: Secondary | ICD-10-CM

## 2020-04-12 DIAGNOSIS — I5033 Acute on chronic diastolic (congestive) heart failure: Secondary | ICD-10-CM | POA: Diagnosis not present

## 2020-04-12 DIAGNOSIS — D509 Iron deficiency anemia, unspecified: Secondary | ICD-10-CM

## 2020-04-12 DIAGNOSIS — Z794 Long term (current) use of insulin: Secondary | ICD-10-CM

## 2020-04-12 NOTE — Progress Notes (Signed)
Location: [place] Room Number: [id number] Place of Service:  ALF [phone])  Provider: Veleta Miners MD  Code Status: DNR Goals of Care:  Advanced Directives 03/24/2020  Does Patient Have a Medical Advance Directive? Yes  Type of Paramedic of [place];Living will  Does patient want to make changes to medical advance directive? -  Copy of [place] in Chart? -  Would patient like information on creating a medical advance directive? No - Patient declined  Pre-existing out of facility DNR order (yellow form or pink MOST form) -     Chief Complaint  Patient presents with  . Acute Visit    CBG and LE edema    HPI: Patient is a 87 y.o. female seen today for an acute visit for Continued high CBG And Worsening LE edema  Patient is new admit to [facility] Patient was living in community but was unable to take care of herself and her medicines. And her son decided to move her to [place]. Patient walks with a walker. Cognitively intact  Has h/o diabetes mellitus type 2, history of alcoholic liver cirrhosis, Portal hypertensive gastropathy s/p EGD Diastolic CHF with Recurent Pleural Effusion and Iron Def Anemia  Active issues  Diabetes Mellitus Type 2 CBG more then 150 in AM and 300 in afternoon and Dinner  Pancytopenia with Iron Def Follows with Hematology and on Iron Infusion  LE edema On Lasix and Aldactone Recent worsening. No SOB or Cough Does have     Past Medical History:  Diagnosis Date  . Anemia   . Anemia of chronic renal failure, stage 3 (moderate) ([place]) 03/24/2020  . Angiodysplasia of ascending colon 10/25/2014  . Anxiety   . Arthritis   . Borderline diabetes   . Cancer of the skin, basal cell 09/03/2012  . Cirrhosis ([place])   . Diabetes mellitus without complication ([place])   . Diverticular disease   . 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 GERD (gastroesophageal reflux disease)   . Hypertension   .  Iron deficiency anemia due to chronic blood loss 01/19/2015  . Portal hypertensive gastropathy ([place]) 08/02/2016   ? Some GAVE also  . Thyroid disease     Past Surgical History:  Procedure Laterality Date  . ABDOMINAL HYSTERECTOMY    . APPENDECTOMY    . COLONOSCOPY    . ESOPHAGOGASTRODUODENOSCOPY    . ESOPHAGOGASTRODUODENOSCOPY (EGD) WITH PROPOFOL N/A 02/22/2020   Procedure: ESOPHAGOGASTRODUODENOSCOPY (EGD) WITH PROPOFOL;  Surgeon: Gatha Mayer, MD;  Location: [facility];  Service: Endoscopy;  Laterality: N/A;  . IR PARACENTESIS  05/25/2016  . IR THORACENTESIS ASP PLEURAL SPACE W/IMG GUIDE  02/23/2020  . LEG SKIN LESION  BIOPSY / EXCISION  12/11/14  . MOHS SURGERY     ankle  . TONSILLECTOMY    . TOTAL HIP ARTHROPLASTY Bilateral 1993, 2006  . UPPER GASTROINTESTINAL ENDOSCOPY  09/09/2017    Allergies  Allergen Reactions  . Tizanidine Other (See Comments)    Hallucinate, confused     Outpatient Encounter Medications as of 04/12/2020  Medication Sig  . Accu-Chek FastClix Lancets MISC CHECK FOR BLOOD SUGAR DAILY  . acetaminophen (TYLENOL) 325 MG tablet Take 650 mg by mouth every 6 (six) hours as needed.  . Ascorbic Acid (VITAMIN C) 1000 MG tablet Take 1,000 mg by mouth at bedtime.  . folic acid (FOLVITE) 1 MG tablet Take 1 tablet (1 mg total) by mouth daily.  . furosemide (LASIX) 20 MG  tablet Take one by mouth at night Monday, Wednesday and Friday.  . furosemide (LASIX) 40 MG tablet Take 1 tablet (40 mg total) by mouth in the morning.  Marland Kitchen glucose blood test strip 1 each by Other route 2 (two) times daily. Use as instructed-Check blood glucose prior to administering insulin  . insulin aspart (NOVOLOG) 100 UNIT/ML injection Inject 12 Units into the skin 3 (three) times daily. Per Sliding scale  . insulin glargine (LANTUS) 100 UNIT/ML injection Inject 45 Units into the skin every morning.  . insulin glargine (LANTUS) 100 UNIT/ML injection Inject 20 Units into the skin at bedtime.  Marland Kitchen  lactulose (CHRONULAC) 10 GM/15ML solution Take by mouth daily.  Marland Kitchen latanoprost (XALATAN) 0.005 % ophthalmic solution Place 1 drop into both eyes at bedtime.   Marland Kitchen levothyroxine (SYNTHROID) 75 MCG tablet Take 1 tablet (75 mcg total) by mouth daily.  Marland Kitchen LORazepam (ATIVAN) 0.5 MG tablet Take 0.5 mg by mouth at bedtime.  . metFORMIN (GLUCOPHAGE-XR) 500 MG 24 hr tablet Take 1 tablet (500 mg total) by mouth daily.  . metoprolol succinate (TOPROL-XL) 25 MG 24 hr tablet Take 12.5 mg by mouth daily.  . pantoprazole (PROTONIX) 40 MG tablet Take 1 tablet (40 mg total) by mouth daily.  . simvastatin (ZOCOR) 20 MG tablet TAKE ONE TABLET BY MOUTH EVERY NIGHT AT BEDTIME  . spironolactone (ALDACTONE) 50 MG tablet Take 1 tablet (50 mg total) by mouth daily.  . vitamin E 180 MG (400 UNITS) capsule Take 400 Units by mouth every evening.   No facility-administered encounter medications on file as of 04/12/2020.    Review of Systems:  Review of Systems  Review of Systems  Constitutional: Negative for activity change, appetite change, chills, diaphoresis, fatigue and fever.  HENT: Negative for mouth sores, postnasal drip, rhinorrhea, sinus pain and sore throat.   Respiratory: Negative for apnea, cough, chest tightness, shortness of breath and wheezing.   Cardiovascular: Negative for chest pain, palpitations   Gastrointestinal: Negative for abdominal distention, abdominal pain, constipation, diarrhea, nausea and vomiting.  Genitourinary: Negative for dysuria and frequency.  Musculoskeletal: Negative for arthralgias, joint swelling and myalgias.  Skin: Negative for rash.  Neurological: Negative for dizziness, syncope, weakness, light-headedness and numbness.  Psychiatric/Behavioral: Negative for behavioral problems, confusion and sleep disturbance.     Health Maintenance  Topic Date Due  . FOOT EXAM  05/12/2015  . COVID-19 Vaccine (3 - Moderna risk 4-dose series) 05/21/2019  . URINE MICROALBUMIN  04/27/2020  .  OPHTHALMOLOGY EXAM  05/17/2020  . HEMOGLOBIN A1C  07/13/2020  . TETANUS/TDAP  11/07/2026  . INFLUENZA VACCINE  Completed  . DEXA SCAN  Completed  . PNA vac Low Risk Adult  Addressed  . HPV VACCINES  Aged Out  . COLONOSCOPY (Pts 45-76yrs Insurance coverage will need to be confirmed)  Discontinued    Physical Exam: Vitals:   04/12/20 1554  BP: (!) 98/55  Pulse: 94  Resp: 18  Temp: (!) 97.2 F (36.2 C)  SpO2: 93%  Weight: 172 lb 6.4 oz (78.2 kg)  Height: 5\' 7"  (1.702 m)   Body mass index is 27 kg/m. Physical Exam  Constitutional: Oriented to person, place, and time. Well-developed and well-nourished.  HENT:  Head: Normocephalic.  Mouth/Throat: Oropharynx is clear and moist.  Eyes: Pupils are equal, round, and reactive to light.  Neck: Neck supple.  Cardiovascular: Normal rate and normal heart sounds.  No murmur heard. Pulmonary/Chest: Effort normal and breath sounds normal. No respiratory distress. No wheezes.  She has no rales.  Abdominal: Soft. Bowel sounds are normal. No distension. There is no tenderness. There is no rebound.  Musculoskeletal: Moderate Edema Bilateral Lymphadenopathy: none Neurological: Alert and oriented to person, place, and time.  Walks with her walker Skin: Skin is warm and dry.  Psychiatric: Normal mood and affect. Behavior is normal. Thought content normal.    Labs reviewed: Basic Metabolic Panel: Recent Labs    04/28/19 1139 05/20/19 1315 11/09/19 1137 11/14/19 1136 12/14/19 1113 01/12/20 1241 01/13/20 0610 01/14/20 0618 02/18/20 0946 03/01/20 1336 03/17/20 0000 03/24/20 1452  NA 134*   < > 136   < > 137   < > 141   < > 135 134 139 135  K 4.0   < > 3.6   < > 3.6   < > 3.4*   < > 4.2 4.4 3.7 4.4  CL 102   < > 100   < > 99   < > 105   < > 101 96 103 98  CO2 25   < > 25   < > 24   < > 23   < > 27 23 28* 31  GLUCOSE 321*   < > 357*   < > 332*   < > 181*   < > 228* 385*  --  242*  BUN 21   < > 14   < > 13   < > 22   < > 26* 19 26* 24*   CREATININE 1.02   < > 1.02*   < > 1.04*   < > 1.11*   < > 1.25* 1.18* 1.1 1.31*  CALCIUM 9.2   < > 9.2   < > 8.8   < > 9.2   < > 9.2 9.4 9.1 10.2  MG  --   --   --   --  1.5*  --  2.2  --   --  1.6  --   --   TSH 3.31  --  2.65  --   --   --   --   --   --   --  2.27  --    < > = values in this interval not displayed.   Liver Function Tests: Recent Labs    01/21/20 1137 02/01/20 1446 03/17/20 0000 03/24/20 1452  AST 32 40* 23 26  ALT 16 21 18 16   ALKPHOS 89 95 70 75  BILITOT 1.8* 1.8*  --  1.1  PROT 5.7* 6.1  --  5.9*  ALBUMIN 3.5 3.6 3.1* 3.6   No results for input(s): LIPASE, AMYLASE in the last 8760 hours. Recent Labs    01/13/20 0610 01/16/20 1213 01/17/20 1048  AMMONIA 37* 71* 53*   CBC: Recent Labs    02/01/20 1446 02/22/20 0930 03/17/20 0000 03/24/20 1452  WBC 5.0 3.3* 2.6 5.5  NEUTROABS 3.7  --  1,604.00 3.9  HGB 10.7* 8.5* 8.8* 9.3*  HCT 32.8* 26.6* 28* 29.7*  MCV 85.3 90.5  --  88.9  PLT 75.0* 58* 55* 77*   Lipid Panel: Recent Labs    04/28/19 1139 11/09/19 1137  CHOL 124 112  HDL 66.90 46*  LDLCALC 43 52  TRIG 71.0 61  CHOLHDL 2 2.4   Lab Results  Component Value Date   HGBA1C 7.6 (H) 01/13/2020    Procedures since last visit: No results found.  Assessment/Plan 1. Type 2 diabetes mellitus with hyperglycemia, with long-term current use of insulin (HCC) Change  Lantus to 45 units in AM and 20 units on PM On Metformin Endocrinology Consult Dr Chalmers Cater  2. Acute on chronic diastolic CHF (congestive heart failure) (HCC) Change Lasix to 60 mg QD Already on High doses of Aldactone Repeat BMP in 1 week  3. Tachycardia Continue on Low dose of Lopressor  4. Hypothyroidism TSH normal in 3/21  5. Iron deficiency anemia, unspecified iron deficiency anemia type Iron Infusion by Hematology  6. Alcoholic cirrhosis of liver with ascites ([place]) Follows with GI On Lactulose and Aldactone  7. Pleural effusion on right Due to her  Cirrhosis Continue to monitor On Diuretics 8. Anxiety On Ativan   Labs/tests ordered:  BMP in 1 week

## 2020-04-12 NOTE — Progress Notes (Signed)
Location:   [place] Room Number: [place]:  ALF [phone]) Provider:  Kami Kube, Lennie Odor NP  Virgie Dad, MD  Patient Care Team: Virgie Dad, MD as PCP - General (Internal Medicine) Berniece Salines, DO as PCP - Cardiology (Cardiology) Sheryn Bison, MD as Referring Physician (Dermatology) Gatha Mayer, MD as Consulting Physician (Gastroenterology) Marin Olp Rudell Cobb, MD as Consulting Physician (Oncology) Janan Ridge, MD as Consulting Physician (Dermatology) Paralee Cancel, MD as Consulting Physician (Orthopedic Surgery) Jacelyn Pi, MD as Consulting Physician (Endocrinology) Day, Melvenia Beam, [facility] (Inactive) as Pharmacist (Pharmacist) Berniece Salines, DO as Consulting Physician (Cardiology)  Extended Emergency Contact Information Primary Emergency Contact: Trinidad Curet [place] Phone: [phone] Mobile Phone: [phone] Relation: Son Secondary Emergency Contact: Johnanna, [place] Mobile Phone: [phone] Relation: Relative Preferred language: English Interpreter needed? No  Code Status:  DNR Goals of care: Advanced Directive information Advanced Directives 03/24/2020  Does Patient Have a Medical Advance Directive? Yes  Type of Paramedic of [place];Living will  Does patient want to make changes to medical advance directive? -  Copy of [place] in Chart? -  Would patient like information on creating a medical advance directive? No - Patient declined  Pre-existing out of facility DNR order (yellow form or pink MOST form) -     Chief Complaint  Patient presents with  . Medical Management of Chronic Issues  . Health Maintenance    Foot exam and covid vaccine    HPI:  Pt is a 87 y.o. female seen today for medical management of chronic diseases.    Last Hgb a1c 7.6 12/2019. The patient admitted she is in her usual state of health. Takes Metformin 500mg  qd. Type  2 diabetes, chronic insulin dependent             Lantus 45 u qam, 20 u qpm,  Novolog 12 u if CBG >250 03/22/20 CHF/Edema chronic, Takes Spironolactone, Furosemide. EF 60-65%. Bun/creat 24/1.31 03/24/20             Hyponatremia, Na 135 03/24/20 HTN, takes Metoprolol Afib, asymptomatic, HR 110 bpm, f/u Cardiology. Takes Metoprolol.  GERD, takes Pantoprazole, f/u GI, gastric antral vascular ectasia. Hgb 9.3 03/24/20 Hypothyroidism, takes Levothyroxine 69mcg qd, TSH 2.27 03/17/20 Anxiety, take Lorazepam 0.5mg  qhs Pleural effusion, last seen by pulmonology 03/01/20. Last hospitalization 12/2019, underwent thoracentesis 01/13/21 and 2 litter fluid removed, negative culture, probably a transudate from liver cirrhosis             Anemia, takes Folic acid. Gastric antral vascular ectasia. Iron infusion 03/04/20. S/p transfused 12/2019 while in hospital. Hgb 9.3, Iron 57 03/24/20.  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      Past Medical History:  Diagnosis Date  . Anemia   . Anemia of chronic renal failure, stage 3 (moderate) ([place]) 03/24/2020  . Angiodysplasia of ascending colon 10/25/2014  . Anxiety   . Arthritis   . Borderline diabetes   . Cancer of the skin, basal cell 09/03/2012  . Cirrhosis ([place])   . Diabetes mellitus without complication ([place])   . Diverticular disease   . 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 GERD (gastroesophageal reflux disease)   . Hypertension   . Iron deficiency anemia due to chronic blood loss 01/19/2015  . Portal hypertensive gastropathy ([place]) 08/02/2016   ? Some GAVE also  . Thyroid disease    Past Surgical History:  Procedure Laterality  Date  . ABDOMINAL HYSTERECTOMY    . APPENDECTOMY    . COLONOSCOPY    . ESOPHAGOGASTRODUODENOSCOPY    . ESOPHAGOGASTRODUODENOSCOPY (EGD) WITH PROPOFOL N/A 02/22/2020    Procedure: ESOPHAGOGASTRODUODENOSCOPY (EGD) WITH PROPOFOL;  Surgeon: Gatha Mayer, MD;  Location: [facility];  Service: Endoscopy;  Laterality: N/A;  . IR PARACENTESIS  05/25/2016  . IR THORACENTESIS ASP PLEURAL SPACE W/IMG GUIDE  02/23/2020  . LEG SKIN LESION  BIOPSY / EXCISION  12/11/14  . MOHS SURGERY     ankle  . TONSILLECTOMY    . TOTAL HIP ARTHROPLASTY Bilateral 1993, 2006  . UPPER GASTROINTESTINAL ENDOSCOPY  09/09/2017    Allergies  Allergen Reactions  . Tizanidine Other (See Comments)    Hallucinate, confused     Allergies as of 04/11/2020      Reactions   Tizanidine Other (See Comments)   Hallucinate, confused       Medication List       Accurate as of April 11, 2020 11:59 PM. If you have any questions, ask your nurse or doctor.        Accu-Chek FastClix Lancets Misc CHECK FOR BLOOD SUGAR DAILY   acetaminophen 325 MG tablet Commonly known as: TYLENOL Take 650 mg by mouth every 6 (six) hours as needed.   folic acid 1 MG tablet Commonly known as: FOLVITE Take 1 tablet (1 mg total) by mouth daily.   furosemide 40 MG tablet Commonly known as: Lasix Take 1 tablet (40 mg total) by mouth in the morning.   furosemide 20 MG tablet Commonly known as: LASIX Take one by mouth at night Monday, Wednesday and Friday.   glucose blood test strip 1 each by Other route 2 (two) times daily. Use as instructed-Check blood glucose prior to administering insulin   insulin aspart 100 UNIT/ML injection Commonly known as: novoLOG Inject 12 Units into the skin 3 (three) times daily. Per Sliding scale   insulin glargine 100 UNIT/ML injection Commonly known as: LANTUS Inject 45 Units into the skin every morning.   insulin glargine 100 UNIT/ML injection Commonly known as: LANTUS Inject 20 Units into the skin at bedtime.   lactulose 10 GM/15ML solution Commonly known as: CHRONULAC Take by mouth daily.   latanoprost 0.005 % ophthalmic solution Commonly known as:  XALATAN Place 1 drop into both eyes at bedtime.   levothyroxine 75 MCG tablet Commonly known as: SYNTHROID Take 1 tablet (75 mcg total) by mouth daily.   LORazepam 0.5 MG tablet Commonly known as: ATIVAN Take 0.5 mg by mouth at bedtime.   metFORMIN 500 MG 24 hr tablet Commonly known as: GLUCOPHAGE-XR Take 1 tablet (500 mg total) by mouth daily.   metoprolol succinate 25 MG 24 hr tablet Commonly known as: TOPROL-XL Take 12.5 mg by mouth daily.   pantoprazole 40 MG tablet Commonly known as: PROTONIX Take 1 tablet (40 mg total) by mouth daily.   simvastatin 20 MG tablet Commonly known as: ZOCOR TAKE ONE TABLET BY MOUTH EVERY NIGHT AT BEDTIME   spironolactone 50 MG tablet Commonly known as: Aldactone Take 1 tablet (50 mg total) by mouth daily.   vitamin C 1000 MG tablet Take 1,000 mg by mouth at bedtime.   vitamin E 180 MG (400 UNITS) capsule Take 400 Units by mouth every evening.       Review of Systems  Constitutional: Negative for activity change, appetite change and fever.  HENT: Positive for hearing loss. Negative for congestion and trouble swallowing.   Eyes:  Negative for visual disturbance.  Respiratory: Negative for cough and shortness of breath.   Cardiovascular: Positive for leg swelling. Negative for chest pain and palpitations.  Gastrointestinal: Negative for abdominal pain, constipation and vomiting.  Genitourinary: Negative for difficulty urinating, dysuria and urgency.  Musculoskeletal: Positive for arthralgias. Negative for gait problem.  Skin: Negative for color change.  Neurological: Negative for speech difficulty, weakness and light-headedness.  Psychiatric/Behavioral: Negative for behavioral problems, hallucinations and sleep disturbance. The patient is not nervous/anxious.     Immunization History  Administered Date(s) Administered  . Fluad Quad(high Dose 65+) 10/27/2018, 02/09/2020  . Hepatitis B, adult 05/12/2014  . Hepatitis B, ped/adol  08/10/2014  . Influenza Split 11/02/2009, 10/15/2010  . Influenza, High Dose Seasonal PF 10/13/2014, 01/26/2017  . Influenza, Quadrivalent, Recombinant, Inj, Pf 09/30/2017  . Influenza, Seasonal, Injecte, Preservative Fre 10/13/2014  . Influenza,inj,quad, With Preservative 11/22/2016  . Influenza-Unspecified 11/25/2013, 10/06/2015, 10/22/2017  . Moderna Sars-Covid-2 Vaccination 03/26/2019, 04/23/2019  . Pneumococcal Conjugate-13 05/17/2014  . Pneumococcal Polysaccharide-23 10/06/2015  . Tdap 10/02/2011, 11/06/2016  . Zoster 01/22/2005   Pertinent  Health Maintenance Due  Topic Date Due  . FOOT EXAM  05/12/2015  . URINE MICROALBUMIN  04/27/2020  . OPHTHALMOLOGY EXAM  05/17/2020  . HEMOGLOBIN A1C  07/13/2020  . INFLUENZA VACCINE  Completed  . DEXA SCAN  Completed  . PNA vac Low Risk Adult  Addressed  . COLONOSCOPY (Pts 45-2yrs Insurance coverage will need to be confirmed)  Discontinued   Fall Risk  04/28/2019 01/09/2018 10/26/2016 10/09/2016 11/25/2015  Falls in the past year? 1 0 Yes No Yes  Number falls in past yr: 0 - 2 or more - 2 or more  Injury with Fall? 0 - No - Yes  Comment - - - - -  Risk Factor Category  - - High Fall Risk - High Fall Risk  Risk for fall due to : - - Impaired balance/gait - -  Follow up Falls evaluation completed - - - Falls prevention discussed   Functional Status Survey:    Vitals:   04/12/20 0831  BP: (!) 98/55  Pulse: 94  Resp: 18  Temp: (!) 97.2 F (36.2 C)  SpO2: 93%  Weight: 172 lb 6.4 oz (78.2 kg)  Height: 5\' 7"  (1.702 m)   Body mass index is 27 kg/m. Physical Exam Vitals and nursing note reviewed.  Constitutional:      Appearance: Normal appearance.  HENT:     Head: Normocephalic and atraumatic.     Mouth/Throat:     Mouth: Mucous membranes are moist.  Eyes:     Extraocular Movements: Extraocular movements intact.     Conjunctiva/sclera: Conjunctivae normal.     Pupils: Pupils are equal, round, and reactive to light.   Cardiovascular:     Rate and Rhythm: Tachycardia present. Rhythm irregular.     Heart sounds: No murmur heard.   Pulmonary:     Breath sounds: Rales present.     Comments: bibasilar Abdominal:     General: Bowel sounds are normal.     Palpations: Abdomen is soft.     Tenderness: There is no abdominal tenderness.  Musculoskeletal:     Cervical back: Normal range of motion and neck supple.     Right lower leg: Edema present.     Left lower leg: Edema present.     Comments: Trace edema BLE. C/o lower back at night.   Skin:    General: Skin is warm and dry.  Neurological:  General: No focal deficit present.     Mental Status: She is alert and oriented to person, place, and time. Mental status is at baseline.  Psychiatric:        Mood and Affect: Mood normal.        Behavior: Behavior normal.        Thought Content: Thought content normal.        Judgment: Judgment normal.     Labs reviewed: Recent Labs    12/14/19 1113 01/12/20 1241 01/13/20 0610 01/14/20 0618 02/18/20 0946 03/01/20 1336 03/17/20 0000 03/24/20 1452  NA 137   < > 141   < > 135 134 139 135  K 3.6   < > 3.4*   < > 4.2 4.4 3.7 4.4  CL 99   < > 105   < > 101 96 103 98  CO2 24   < > 23   < > 27 23 28* 31  GLUCOSE 332*   < > 181*   < > 228* 385*  --  242*  BUN 13   < > 22   < > 26* 19 26* 24*  CREATININE 1.04*   < > 1.11*   < > 1.25* 1.18* 1.1 1.31*  CALCIUM 8.8   < > 9.2   < > 9.2 9.4 9.1 10.2  MG 1.5*  --  2.2  --   --  1.6  --   --    < > = values in this interval not displayed.   Recent Labs    01/21/20 1137 02/01/20 1446 03/17/20 0000 03/24/20 1452  AST 32 40* 23 26  ALT 16 21 18 16   ALKPHOS 89 95 70 75  BILITOT 1.8* 1.8*  --  1.1  PROT 5.7* 6.1  --  5.9*  ALBUMIN 3.5 3.6 3.1* 3.6   Recent Labs    02/01/20 1446 02/22/20 0930 03/17/20 0000 03/24/20 1452  WBC 5.0 3.3* 2.6 5.5  NEUTROABS 3.7  --  1,604.00 3.9  HGB 10.7* 8.5* 8.8* 9.3*  HCT 32.8* 26.6* 28* 29.7*  MCV 85.3 90.5  --   88.9  PLT 75.0* 58* 55* 77*   Lab Results  Component Value Date   TSH 2.27 03/17/2020   Lab Results  Component Value Date   HGBA1C 7.6 (H) 01/13/2020   Lab Results  Component Value Date   CHOL 112 11/09/2019   HDL 46 (L) 11/09/2019   LDLCALC 52 11/09/2019   TRIG 61 11/09/2019   CHOLHDL 2.4 11/09/2019    Significant Diagnostic Results in last 30 days:  No results found.  Assessment/Plan Alcoholic cirrhosis of liver with ascites (HCC) Alcoholic cirrhosis with ascites/portal hypertensive gastropathy, takes Lactulose, Aldactone.    Anemia, unspecified takes Folic acid. Gastric antral vascular ectasia. Iron infusion 03/04/20. S/p transfused 12/2019 while in hospital. Hgb 9.3, Iron 57 03/24/20.    Pleural effusion on right Pleural effusion, last seen by pulmonology 03/01/20. Last hospitalization 12/2019, underwent thoracentesis 01/13/21 and 2 litter fluid removed, negative culture, probably a transudate from liver cirrhosis    Adult hypothyroidism  takes Levothyroxine 21mcg qd, TSH 2.27 03/17/20   Anxiety take Lorazepam 0.5mg  qhs  GERD (gastroesophageal reflux disease) takes Pantoprazole, f/u GI, gastric antral vascular ectasia. Hgb 9.3 03/24/20   Chronic atrial fibrillation (HCC) asymptomatic, HR 110 bpm, f/u Cardiology. Takes Metoprolol.   Essential hypertension Blood pressure is controlled, continue Metoprolol.   Hyponatremia Stable, Na 135 03/24/20  Acute on chronic diastolic CHF (congestive heart failure) ([place])  CHF/Edema chronic, Takes Spironolactone, Furosemide. EF 60-65%. Bun/creat 24/1.31 03/24/20   Hyperglycemia due to type 2 diabetes mellitus (HCC)  Last Hgb a1c 7.6 12/2019. The patient admitted she is in her usual state of health. Takes Metformin 500mg  qd. Type 2 diabetes, chronic insulin dependent             Lantus 45 u qam, 20 u qpm,  Novolog 12 u if CBG >250 03/22/20    Podiatry for foot exam.   Family/ staff Communication:  plan of care reviewed with the patient and charge nurse.   Labs/tests ordered:  none  Time spend 40 minutes.

## 2020-04-15 ENCOUNTER — Encounter: Payer: Self-pay | Admitting: Internal Medicine

## 2020-04-15 ENCOUNTER — Encounter: Payer: Self-pay | Admitting: Nurse Practitioner

## 2020-04-18 ENCOUNTER — Telehealth: Payer: Self-pay

## 2020-04-18 NOTE — Telephone Encounter (Signed)
pts daughter called and req to r/s her 3/31 appt, done    anne

## 2020-04-19 ENCOUNTER — Encounter: Payer: Self-pay | Admitting: Nurse Practitioner

## 2020-04-19 LAB — BASIC METABOLIC PANEL
BUN: 19 (ref 4–21)
CO2: 28 — AB (ref 13–22)
Chloride: 104 (ref 99–108)
Creatinine: 1 (ref 0.5–1.1)
Glucose: 246
Potassium: 3.9 (ref 3.4–5.3)
Sodium: 138 (ref 137–147)

## 2020-04-19 LAB — COMPREHENSIVE METABOLIC PANEL
Calcium: 8.9 (ref 8.7–10.7)
GFR calc Af Amer: 58
GFR calc non Af Amer: 50

## 2020-04-19 NOTE — Assessment & Plan Note (Signed)
asymptomatic, HR 110 bpm, f/u Cardiology. Takes Metoprolol.

## 2020-04-19 NOTE — Assessment & Plan Note (Addendum)
takes Pantoprazole, f/u GI, gastric antral vascular ectasia. Hgb 9.3 03/24/20

## 2020-04-19 NOTE — Assessment & Plan Note (Signed)
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

## 2020-04-19 NOTE — Assessment & Plan Note (Signed)
Blood pressure is controlled, continue Metoprolol. 

## 2020-04-19 NOTE — Assessment & Plan Note (Signed)
takes Levothyroxine 108mcg qd, TSH 2.27 03/17/20

## 2020-04-19 NOTE — Assessment & Plan Note (Signed)
take Lorazepam 0.5mg  qhs

## 2020-04-19 NOTE — Assessment & Plan Note (Signed)
Pleural effusion, last seen by pulmonology 03/01/20. Last hospitalization 12/2019, underwent thoracentesis 01/13/21 and 2 litter fluid removed, negative culture, probably a transudate from liver cirrhosis

## 2020-04-19 NOTE — Assessment & Plan Note (Signed)
takes Folic acid. Gastric antral vascular ectasia. Iron infusion 03/04/20. S/p transfused 12/2019 while in hospital. Hgb 9.3, Iron 57 03/24/20.

## 2020-04-19 NOTE — Assessment & Plan Note (Signed)
Last Hgb a1c 7.6 12/2019. The patient admitted she is in her usual state of health. Takes Metformin 500mg  qd. Type 2 diabetes, chronic insulin dependent             Lantus 45 u qam, 20 u qpm,  Novolog 12 u if CBG >250 03/22/20

## 2020-04-19 NOTE — Assessment & Plan Note (Signed)
CHF/Edema chronic, Takes Spironolactone, Furosemide. EF 60-65%. Bun/creat 24/1.31 03/24/20

## 2020-04-19 NOTE — Assessment & Plan Note (Signed)
Stable, Na 135 03/24/20

## 2020-04-21 ENCOUNTER — Ambulatory Visit: Payer: HMO | Admitting: Hematology & Oncology

## 2020-04-21 ENCOUNTER — Other Ambulatory Visit: Payer: HMO

## 2020-04-21 ENCOUNTER — Ambulatory Visit: Payer: HMO

## 2020-04-22 ENCOUNTER — Ambulatory Visit: Payer: HMO | Admitting: *Deleted

## 2020-04-27 ENCOUNTER — Other Ambulatory Visit: Payer: Self-pay

## 2020-04-27 MED ORDER — LORAZEPAM 0.5 MG PO TABS: 0.5000 mg | ORAL_TABLET | Freq: Every day | ORAL | 0 refills | Status: DC

## 2020-04-27 NOTE — Telephone Encounter (Signed)
Refill request for Lorazepam received for Lorazepam 0.5 mg tablet.  Medication pended and sent to Dr. Lyndel Safe for approval.

## 2020-04-28 ENCOUNTER — Encounter: Payer: Self-pay | Admitting: Nurse Practitioner

## 2020-04-28 ENCOUNTER — Non-Acute Institutional Stay: Payer: HMO | Admitting: Nurse Practitioner

## 2020-04-28 DIAGNOSIS — I10 Essential (primary) hypertension: Secondary | ICD-10-CM

## 2020-04-28 DIAGNOSIS — K219 Gastro-esophageal reflux disease without esophagitis: Secondary | ICD-10-CM

## 2020-04-28 DIAGNOSIS — E119 Type 2 diabetes mellitus without complications: Secondary | ICD-10-CM | POA: Diagnosis not present

## 2020-04-28 DIAGNOSIS — I5033 Acute on chronic diastolic (congestive) heart failure: Secondary | ICD-10-CM | POA: Diagnosis not present

## 2020-04-28 DIAGNOSIS — E871 Hypo-osmolality and hyponatremia: Secondary | ICD-10-CM

## 2020-04-28 DIAGNOSIS — D509 Iron deficiency anemia, unspecified: Secondary | ICD-10-CM

## 2020-04-28 DIAGNOSIS — I482 Chronic atrial fibrillation, unspecified: Secondary | ICD-10-CM

## 2020-04-28 DIAGNOSIS — J90 Pleural effusion, not elsewhere classified: Secondary | ICD-10-CM

## 2020-04-28 DIAGNOSIS — F419 Anxiety disorder, unspecified: Secondary | ICD-10-CM

## 2020-04-28 DIAGNOSIS — E039 Hypothyroidism, unspecified: Secondary | ICD-10-CM

## 2020-04-28 DIAGNOSIS — K7031 Alcoholic cirrhosis of liver with ascites: Secondary | ICD-10-CM

## 2020-04-28 DIAGNOSIS — Z794 Long term (current) use of insulin: Secondary | ICD-10-CM

## 2020-04-28 NOTE — Progress Notes (Signed)
Location:   [facility] Room Number: 909-A Place of Service:  ALF (13) Provider: Lennie Odor Jawaan Adachi NP  Virgie Dad, MD  Patient Care Team: Virgie Dad, MD as PCP - General (Internal Medicine) Berniece Salines, DO as PCP - Cardiology (Cardiology) Sheryn Bison, MD as Referring Physician (Dermatology) Gatha Mayer, MD as Consulting Physician (Gastroenterology) Marin Olp Rudell Cobb, MD as Consulting Physician (Oncology) Janan Ridge, MD as Consulting Physician (Dermatology) Paralee Cancel, MD as Consulting Physician (Orthopedic Surgery) Jacelyn Pi, MD as Consulting Physician (Endocrinology) Day, Melvenia Beam, [facility] (Inactive) as Pharmacist (Pharmacist) Berniece Salines, DO as Consulting Physician (Cardiology)  Extended Emergency Contact Information Primary Emergency Contact: Trinidad Curet [place] Phone: [phone] Mobile Phone: [phone] Relation: Son Secondary Emergency Contact: Sera, [place] Mobile Phone: [phone] Relation: Relative Preferred language: English Interpreter needed? No  Code Status: DNR Goals of care: Advanced Directive information Advanced Directives 04/28/2020  Does Patient Have a Medical Advance Directive? Yes  Type of Paramedic of [place];Living will;Out of facility DNR (pink MOST or yellow form)  Does patient want to make changes to medical advance directive? No - Patient declined  Copy of [place] in Chart? Yes - validated most recent copy scanned in chart (See row information)  Would patient like information on creating a medical advance directive? -  Pre-existing out of facility DNR order (yellow form or pink MOST form) -     Chief Complaint  Patient presents with  . Acute Visit    Swelling in legs.    HPI:  Pt is a 87 y.o. female seen today for an acute visit for increase swelling, enlarged abd, weight gained about # 11Ibs in the past 2 weeks. The patient denied increased  SOB, cough, phlegm production.   Type 2 diabetes, chronic insulin dependent Lantus 45 u qam, 20 u qpm,  Novolog 5u with meals  CBG >100 per endocrinology. Takes Metformin 553m qd. CHF/Edema chronic, Takes Spironolactone, Furosemide. EF 60-65%. Bun/creat19/1.0 04/19/20 Hyponatremia, Na 138 04/19/20 HTN, takes Metoprolol Afib, asymptomatic, f/u Cardiology. Takes Metoprolol.  GERD, takes Pantoprazole, f/u GI, gastric antral vascular ectasia.Hgb 9.3 03/24/20 Hypothyroidism, takes Levothyroxine 781m qd, TSH 2.27 2/24/22t Anxiety, take Lorazepam 0.83m78mhs Pleural effusion, last seen by pulmonology 03/01/20. Last hospitalization 12/2019, underwent thoracentesis 01/13/21 and 2 litter fluid removed, negative culture, probably a transudate from liver cirrhosis  Anemia, takes Folic acid. Gastric antral vascular ectasia. Iron infusion 03/04/20. S/p transfused 12/2019 while in hospital. Hgb 9.3, Iron 57 03/24/20.  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                 Past Medical History:  Diagnosis Date  . Anemia   . Anemia of chronic renal failure, stage 3 (moderate) ([place]CC[place]/03/2020  . Angiodysplasia of ascending colon 10/25/2014  . Anxiety   . Arthritis   . Borderline diabetes   . Cancer of the skin, basal cell 09/03/2012  . Cirrhosis (HCC[place] . Diabetes mellitus without complication ([place]CC[place] . Diverticular disease   . 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 GERD (gastroesophageal reflux disease)   . Hypertension   . Iron deficiency anemia due to chronic blood loss 01/19/2015  . Portal hypertensive gastropathy (HCCGas[place]/12/2016   ? Some GAVE also  . Thyroid disease    Past Surgical History:  Procedure Laterality Date  . ABDOMINAL HYSTERECTOMY    . APPENDECTOMY    . COLONOSCOPY    .  ESOPHAGOGASTRODUODENOSCOPY    . ESOPHAGOGASTRODUODENOSCOPY (EGD) WITH PROPOFOL N/A 02/22/2020   Procedure: ESOPHAGOGASTRODUODENOSCOPY (EGD) WITH PROPOFOL;  Surgeon: Gatha Mayer, MD;  Location: [facility];  Service: Endoscopy;  Laterality: N/A;  . IR PARACENTESIS  05/25/2016  . IR THORACENTESIS ASP PLEURAL SPACE W/IMG GUIDE  02/23/2020  . LEG SKIN LESION  BIOPSY / EXCISION  12/11/14  . MOHS SURGERY     ankle  . TONSILLECTOMY    . TOTAL HIP ARTHROPLASTY Bilateral 1993, 2006  . UPPER GASTROINTESTINAL ENDOSCOPY  09/09/2017    Allergies  Allergen Reactions  . Tizanidine Other (See Comments)    Hallucinate, confused     Allergies as of 04/28/2020      Reactions   Tizanidine Other (See Comments)   Hallucinate, confused       Medication List       Accurate as of April 28, 2020  1:19 PM. If you have any questions, ask your nurse or doctor.        STOP taking these medications   furosemide 20 MG tablet Commonly known as: LASIX Stopped by: Cayson Kalb X Nathaneal Sommers, NP   furosemide 40 MG tablet Commonly known as: Lasix Stopped by: Melisha Eggleton X Tynetta Bachmann, NP     TAKE these medications   Accu-Chek FastClix Lancets Misc CHECK FOR BLOOD SUGAR DAILY   acetaminophen 325 MG tablet Commonly known as: TYLENOL Take 650 mg by mouth every 6 (six) hours as needed.   folic acid 1 MG tablet Commonly known as: FOLVITE Take 1 tablet (1 mg total) by mouth daily.   glucose blood test strip 1 each by Other route 2 (two) times daily. Use as instructed-Check blood glucose prior to administering insulin   insulin aspart 100 UNIT/ML injection Commonly known as: novoLOG Inject 12 Units into the skin 3 (three) times daily. Per Sliding scale   insulin glargine 100 UNIT/ML injection Commonly known as: LANTUS Inject 45 Units into the skin every morning.   insulin glargine 100 UNIT/ML injection Commonly known as: LANTUS Inject 20 Units into the skin at bedtime.   lactulose 10 GM/15ML solution Commonly known as:  CHRONULAC Take by mouth daily.   latanoprost 0.005 % ophthalmic solution Commonly known as: XALATAN Place 1 drop into both eyes at bedtime.   levothyroxine 75 MCG tablet Commonly known as: SYNTHROID Take 1 tablet (75 mcg total) by mouth daily.   LORazepam 0.5 MG tablet Commonly known as: ATIVAN Take 1 tablet (0.5 mg total) by mouth at bedtime.   metFORMIN 500 MG 24 hr tablet Commonly known as: GLUCOPHAGE-XR Take 1 tablet (500 mg total) by mouth daily.   metoprolol succinate 25 MG 24 hr tablet Commonly known as: TOPROL-XL Take 12.5 mg by mouth daily.   pantoprazole 40 MG tablet Commonly known as: PROTONIX Take 1 tablet (40 mg total) by mouth daily.   simvastatin 20 MG tablet Commonly known as: ZOCOR TAKE ONE TABLET BY MOUTH EVERY NIGHT AT BEDTIME   spironolactone 50 MG tablet Commonly known as: Aldactone Take 1 tablet (50 mg total) by mouth daily.   vitamin C 1000 MG tablet Take 1,000 mg by mouth at bedtime.   vitamin E 180 MG (400 UNITS) capsule Take 400 Units by mouth every evening.       Review of Systems  Constitutional: Positive for fatigue and unexpected weight change. Negative for appetite change and fever.       Weight gained about #10Ibs in the past 2 wks.   HENT: Positive for hearing loss.  Negative for congestion and trouble swallowing.   Eyes: Negative for visual disturbance.  Respiratory: Positive for shortness of breath. Negative for cough.        DOE  Cardiovascular: Positive for leg swelling. Negative for chest pain and palpitations.  Gastrointestinal: Positive for abdominal distention. Negative for abdominal pain, constipation, nausea and vomiting.  Genitourinary: Negative for difficulty urinating, dysuria and urgency.  Musculoskeletal: Positive for arthralgias. Negative for gait problem.  Skin: Negative for color change.  Neurological: Negative for speech difficulty, weakness and light-headedness.  Psychiatric/Behavioral: Negative for  behavioral problems and sleep disturbance. The patient is not nervous/anxious.     Immunization History  Administered Date(s) Administered  . Fluad Quad(high Dose 65+) 10/27/2018, 02/09/2020  . Hepatitis B, adult 05/12/2014  . Hepatitis B, ped/adol 08/10/2014  . Influenza Split 11/02/2009, 10/15/2010  . Influenza, High Dose Seasonal PF 10/13/2014, 01/26/2017  . Influenza, Quadrivalent, Recombinant, Inj, Pf 09/30/2017  . Influenza, Seasonal, Injecte, Preservative Fre 10/13/2014  . Influenza,inj,quad, With Preservative 11/22/2016  . Influenza-Unspecified 11/25/2013, 10/06/2015, 10/22/2017  . Moderna Sars-Covid-2 Vaccination 03/26/2019, 04/23/2019  . Pneumococcal Conjugate-13 05/17/2014  . Pneumococcal Polysaccharide-23 10/06/2015  . Tdap 10/02/2011, 11/06/2016  . Zoster 01/22/2005   Pertinent  Health Maintenance Due  Topic Date Due  . FOOT EXAM  05/12/2015  . URINE MICROALBUMIN  04/27/2020  . OPHTHALMOLOGY EXAM  05/17/2020  . HEMOGLOBIN A1C  07/13/2020  . INFLUENZA VACCINE  08/22/2020  . DEXA SCAN  Completed  . PNA vac Low Risk Adult  Addressed  . COLONOSCOPY (Pts 45-82yr Insurance coverage will need to be confirmed)  Discontinued   Fall Risk  04/28/2019 01/09/2018 10/26/2016 10/09/2016 11/25/2015  Falls in the past year? 1 0 Yes No Yes  Number falls in past yr: 0 - 2 or more - 2 or more  Injury with Fall? 0 - No - Yes  Comment - - - - -  Risk Factor Category  - - High Fall Risk - High Fall Risk  Risk for fall due to : - - Impaired balance/gait - -  Follow up Falls evaluation completed - - - Falls prevention discussed   Functional Status Survey:    Vitals:   04/28/20 1049  BP: 128/76  Pulse: 86  Resp: 20  Temp: 98.3 F (36.8 C)  SpO2: 96%  Weight: 181 lb 3.2 oz (82.2 kg)  Height: 5' 7"  (1.702 m)   Body mass index is 28.38 kg/m. Physical Exam Vitals and nursing note reviewed.  Constitutional:      Appearance: Normal appearance.  HENT:     Head: Normocephalic and  atraumatic.     Mouth/Throat:     Mouth: Mucous membranes are moist.  Eyes:     Extraocular Movements: Extraocular movements intact.     Conjunctiva/sclera: Conjunctivae normal.     Pupils: Pupils are equal, round, and reactive to light.  Cardiovascular:     Rate and Rhythm: Tachycardia present. Rhythm irregular.     Heart sounds: No murmur heard.   Pulmonary:     Effort: Pulmonary effort is normal.     Breath sounds: Rales present. No wheezing or rhonchi.     Comments: bibasilar Abdominal:     General: Bowel sounds are normal. There is distension.     Palpations: Abdomen is soft.     Tenderness: There is no abdominal tenderness. There is no right CVA tenderness, left CVA tenderness, guarding or rebound.  Musculoskeletal:     Cervical back: Normal range of motion and neck  supple.     Right lower leg: Edema present.     Left lower leg: Edema present.     Comments: 3+ edema BLE. C/o lower back at night.   Skin:    General: Skin is warm and dry.     Comments: Venous insufficiency skin changes BLE  Neurological:     General: No focal deficit present.     Mental Status: She is alert. Mental status is at baseline.     Gait: Gait abnormal.     Comments: Oriented to person, place.   Psychiatric:        Mood and Affect: Mood normal.        Behavior: Behavior normal.        Thought Content: Thought content normal.        Judgment: Judgment normal.     Labs reviewed: Recent Labs    12/14/19 1113 01/12/20 1241 01/13/20 0610 01/14/20 0618 02/18/20 0946 03/01/20 1336 03/17/20 0000 03/24/20 1452 04/19/20 0000  NA 137   < > 141   < > 135 134 139 135 138  K 3.6   < > 3.4*   < > 4.2 4.4 3.7 4.4 3.9  CL 99   < > 105   < > 101 96 103 98 104  CO2 24   < > 23   < > 27 23 28* 31 28*  GLUCOSE 332*   < > 181*   < > 228* 385*  --  242*  --   BUN 13   < > 22   < > 26* 19 26* 24* 19  CREATININE 1.04*   < > 1.11*   < > 1.25* 1.18* 1.1 1.31* 1.0  CALCIUM 8.8   < > 9.2   < > 9.2 9.4 9.1  10.2 8.9  MG 1.5*  --  2.2  --   --  1.6  --   --   --    < > = values in this interval not displayed.   Recent Labs    01/21/20 1137 02/01/20 1446 03/17/20 0000 03/24/20 1452  AST 32 40* 23 26  ALT 16 21 18 16   ALKPHOS 89 95 70 75  BILITOT 1.8* 1.8*  --  1.1  PROT 5.7* 6.1  --  5.9*  ALBUMIN 3.5 3.6 3.1* 3.6   Recent Labs    02/01/20 1446 02/22/20 0930 03/17/20 0000 03/24/20 1452  WBC 5.0 3.3* 2.6 5.5  NEUTROABS 3.7  --  1,604.00 3.9  HGB 10.7* 8.5* 8.8* 9.3*  HCT 32.8* 26.6* 28* 29.7*  MCV 85.3 90.5  --  88.9  PLT 75.0* 58* 55* 77*   Lab Results  Component Value Date   TSH 2.27 03/17/2020   Lab Results  Component Value Date   HGBA1C 7.6 (H) 01/13/2020   Lab Results  Component Value Date   CHOL 112 11/09/2019   HDL 46 (L) 11/09/2019   LDLCALC 52 11/09/2019   TRIG 61 11/09/2019   CHOLHDL 2.4 11/09/2019    Significant Diagnostic Results in last 30 days:  No results found.  Assessment/Plan: Acute on chronic diastolic CHF (congestive heart failure) (HCC) CHF/Edema chronic, Takes Spironolactone, Furosemide. EF 60-65%. Bun/creat19/1.0 04/19/20. increase swelling, enlarged abd, weight gained about # 11Ibs in the past 2 weeks. The patient denied increased SOB, cough, phlegm production.  Will dc Furosemide, starts Torsemide 57m qd, update CBC/diff, CMP/eGFR   Type 2 diabetes mellitus without complication, with long-term current use of insulin (HCC) Type  2 diabetes, chronic insulin dependent Lantus 50 u qam, 25 u qpm,  Novolog 5u with meals  CBG >100 per endocrinology 04/26/20. Takes Metformin 559m qd.   Hyponatremia Na 138 04/19/20  Essential hypertension Blood pressure is controlled, continue Metoprolol   Chronic atrial fibrillation (HCC) Afib, asymptomatic, f/u Cardiology. Takes Metoprolol.    GERD (gastroesophageal reflux disease)  takes Pantoprazole, f/u GI, gastric antral vascular ectasia.Hgb 9.3 03/24/20   Adult hypothyroidism   takes Levothyroxine 7378m qd, TSH 2.27 2/24/22t   Anxiety  take Lorazepam 0.78m39mhs   Pleural effusion on right Pleural effusion, last seen by pulmonology 03/01/20. Last hospitalization 12/2019, underwent thoracentesis 01/13/21 and 2 litter fluid removed, negative culture, probably a transudate from liver cirrhosis    Anemia, unspecified Anemia, takes Folic acid. Gastric antral vascular ectasia. Iron infusion 03/04/20. S/p transfused 12/2019 while in hospital. Hgb 9.3, Iron 57 03/24/20.   Alcoholic cirrhosis of liver with ascites (HCC) Alcoholic cirrhosis with ascites/portal hypertensive gastropathy, takes Lactulose, Aldactone. US Kore[place] to evaluate in setting of enlarged abd, leg selling, and weight gained about #10 Ibs in the past 2 weeks.      Family/ staff Communication: plan of care reviewed with the patient and charge nurse.   Labs/tests ordered:  US Kore[place], CBC/diff, CMP/eGFR  Time spend 40 minutes.

## 2020-04-28 NOTE — Assessment & Plan Note (Signed)
takes Pantoprazole, f/u GI, gastric antral vascular ectasia.Hgb 9.3 03/24/20

## 2020-04-28 NOTE — Assessment & Plan Note (Signed)
CHF/Edema chronic, Takes Spironolactone, Furosemide. EF 60-65%. Bun/creat19/1.0 04/19/20. increase swelling, enlarged abd, weight gained about # 11Ibs in the past 2 weeks. The patient denied increased SOB, cough, phlegm production.  Will dc Furosemide, starts Torsemide 76m qd, update CBC/diff, CMP/eGFR

## 2020-04-28 NOTE — Assessment & Plan Note (Signed)
Na 138 04/19/20

## 2020-04-28 NOTE — Assessment & Plan Note (Signed)
Blood pressure is controlled, continue Metoprolol. 

## 2020-04-28 NOTE — Assessment & Plan Note (Signed)
Afib, asymptomatic, f/u Cardiology. Takes Metoprolol.

## 2020-04-28 NOTE — Assessment & Plan Note (Signed)
take Lorazepam 0.5mg  qhs

## 2020-04-28 NOTE — Assessment & Plan Note (Signed)
Type 2 diabetes, chronic insulin dependent Lantus 50 u qam, 25 u qpm,  Novolog 5u with meals  CBG >100 per endocrinology 04/26/20. Takes Metformin 500mg  qd.

## 2020-04-28 NOTE — Assessment & Plan Note (Signed)
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 [place] abd to evaluate in setting of enlarged abd, leg selling, and weight gained about #10 Ibs in the past 2 weeks.

## 2020-04-28 NOTE — Assessment & Plan Note (Signed)
Anemia, takes Folic acid. Gastric antral vascular ectasia. Iron infusion 03/04/20. S/p transfused 12/2019 while in hospital. Hgb 9.3, Iron 57 03/24/20.

## 2020-04-28 NOTE — Assessment & Plan Note (Signed)
takes Levothyroxine 37mcg qd, TSH 2.27 2/24/22t

## 2020-04-28 NOTE — Assessment & Plan Note (Signed)
Pleural effusion, last seen by pulmonology 03/01/20. Last hospitalization 12/2019, underwent thoracentesis 01/13/21 and 2 litter fluid removed, negative culture, probably a transudate from liver cirrhosis

## 2020-04-29 ENCOUNTER — Inpatient Hospital Stay: Payer: HMO

## 2020-04-29 ENCOUNTER — Inpatient Hospital Stay: Payer: HMO | Admitting: Hematology & Oncology

## 2020-04-29 ENCOUNTER — Telehealth: Payer: Self-pay

## 2020-04-29 NOTE — Telephone Encounter (Signed)
Pt s daughter called and left a vm to r/s todays appts, a new appt was left for her  Raymont Andreoni

## 2020-05-02 ENCOUNTER — Other Ambulatory Visit: Payer: Self-pay | Admitting: *Deleted

## 2020-05-02 MED ORDER — LORAZEPAM 0.5 MG PO TABS: 0.5000 mg | ORAL_TABLET | Freq: Every day | ORAL | 0 refills | Status: DC

## 2020-05-02 NOTE — Telephone Encounter (Signed)
[facility] requested refill 4/6 a rx was sent to [facility].  Pended Rx and sent to Dr. Lyndel Safe for approval.

## 2020-05-04 ENCOUNTER — Encounter: Payer: Self-pay | Admitting: Nurse Practitioner

## 2020-05-04 ENCOUNTER — Non-Acute Institutional Stay: Payer: HMO | Admitting: Nurse Practitioner

## 2020-05-04 ENCOUNTER — Telehealth: Payer: Self-pay | Admitting: Cardiology

## 2020-05-04 DIAGNOSIS — D500 Iron deficiency anemia secondary to blood loss (chronic): Secondary | ICD-10-CM

## 2020-05-04 DIAGNOSIS — F419 Anxiety disorder, unspecified: Secondary | ICD-10-CM

## 2020-05-04 DIAGNOSIS — N1832 Chronic kidney disease, stage 3b: Secondary | ICD-10-CM | POA: Diagnosis not present

## 2020-05-04 DIAGNOSIS — K219 Gastro-esophageal reflux disease without esophagitis: Secondary | ICD-10-CM

## 2020-05-04 DIAGNOSIS — K7031 Alcoholic cirrhosis of liver with ascites: Secondary | ICD-10-CM

## 2020-05-04 DIAGNOSIS — E1165 Type 2 diabetes mellitus with hyperglycemia: Secondary | ICD-10-CM

## 2020-05-04 DIAGNOSIS — I5032 Chronic diastolic (congestive) heart failure: Secondary | ICD-10-CM

## 2020-05-04 DIAGNOSIS — I10 Essential (primary) hypertension: Secondary | ICD-10-CM

## 2020-05-04 DIAGNOSIS — E039 Hypothyroidism, unspecified: Secondary | ICD-10-CM

## 2020-05-04 DIAGNOSIS — E876 Hypokalemia: Secondary | ICD-10-CM

## 2020-05-04 DIAGNOSIS — I482 Chronic atrial fibrillation, unspecified: Secondary | ICD-10-CM

## 2020-05-04 DIAGNOSIS — Z794 Long term (current) use of insulin: Secondary | ICD-10-CM

## 2020-05-04 DIAGNOSIS — J90 Pleural effusion, not elsewhere classified: Secondary | ICD-10-CM

## 2020-05-04 NOTE — Assessment & Plan Note (Signed)
Pleural effusion, last seen by pulmonology 03/01/20. Last hospitalization 12/2019, underwent thoracentesis 01/13/21 and 2 litter fluid removed, negative culture, probably a transudate from liver cirrhosis

## 2020-05-04 NOTE — Assessment & Plan Note (Addendum)
asymptomatic, rapid heart beats at times, f/u Cardiology.Takes Metoprolol.

## 2020-05-04 NOTE — Assessment & Plan Note (Signed)
Blood pressure is controlled, continue Metoprolol. 

## 2020-05-04 NOTE — Assessment & Plan Note (Signed)
takes Levothyroxine 13mcg qd, TSH 2.27 2/24/22t

## 2020-05-04 NOTE — Telephone Encounter (Signed)
STAT if HR is under 50 or over 120 (normal HR is 60-100 beats per minute)  1) What is your heart rate? 121  2) Do you have a log of your heart rate readings (document readings)?   4/12: 94  4/13: 115,126,126,121  3) Do you have any other symptoms? No   [facility] called and reported the patient fell on her bottom this morning. They have been doing neuro checks on the patient since the fall. Checks started every 15 min and stretched checks out to hourly.

## 2020-05-04 NOTE — Assessment & Plan Note (Signed)
CHF/Edema chronic, Takes Spironolactone, Torsemide since 04/28/20. EF 60-65%. Bun/creatBun/creat 22/1.22 05/03/20

## 2020-05-04 NOTE — Assessment & Plan Note (Addendum)
Anemia, takes Folic acid. Gastric antral vascular ectasia. Iron infusion, f/u oncology,  Hgb 9.3, Iron 573/3/22.Hgb 7.9 05/03/20 05/12/20 Na 138, K 3.7, Bun 21, creat 1.18, total bilirubin 1.5. wbc 3.6, Hgb 7.7, plt 57, neutrophils 64.8%

## 2020-05-04 NOTE — Assessment & Plan Note (Signed)
takes Pantoprazole, f/u GI, gastric antral vascular ectasia.Hgb 7.8 05/03/20

## 2020-05-04 NOTE — Assessment & Plan Note (Signed)
serum K 3.1 4/12//22, the patient denied increased muscle weakness, palpitation, or GI symptoms.  Adding Kcl 55mq po qd, update CMP/eGFR one week.

## 2020-05-04 NOTE — Assessment & Plan Note (Signed)
take Lorazepam 0.5mg  qhs

## 2020-05-04 NOTE — Telephone Encounter (Signed)
Ideally the Nursing home  should have contacted patient's PCP office but I would recommend that she fell which may have been unsupervised to be evaluated.

## 2020-05-04 NOTE — Assessment & Plan Note (Addendum)
Alcoholic cirrhosis with ascites/portal hypertensive gastropathy, takes Lactulose, Aldactone.[place] abd 04/29/20 no abnormal fluid in the peritoneum, a small amount of debirs on the gallbladder floor, no shadowing stones, large splenomegaly 05/12/20 Na 138, K 3.7, Bun 21, creat 1.18, total bilirubin 1.5. wbc 3.6, Hgb 7.7, plt 57, neutrophils 64.8%

## 2020-05-04 NOTE — Assessment & Plan Note (Signed)
Type 2 diabetes, chronic insulin dependent Lantus 45u qam, 20 u qpm,Novolog 5u with meals  CBG >100 per endocrinology. Takes Metformin 500mg  qd.

## 2020-05-04 NOTE — Progress Notes (Addendum)
Location:   [facility] Room Number: 909-A Place of Service:  ALF (13) Provider: Lennie Odor Stellan Vick NP  Virgie Dad, MD  Patient Care Team: Virgie Dad, MD as PCP - General (Internal Medicine) Berniece Salines, DO as PCP - Cardiology (Cardiology) Sheryn Bison, MD as Referring Physician (Dermatology) Gatha Mayer, MD as Consulting Physician (Gastroenterology) Marin Olp Rudell Cobb, MD as Consulting Physician (Oncology) Janan Ridge, MD as Consulting Physician (Dermatology) Paralee Cancel, MD as Consulting Physician (Orthopedic Surgery) Jacelyn Pi, MD as Consulting Physician (Endocrinology) Day, Melvenia Beam, [facility] (Inactive) as Pharmacist (Pharmacist) Berniece Salines, DO as Consulting Physician (Cardiology)  Extended Emergency Contact Information Primary Emergency Contact: Trinidad Curet [place] Phone: [phone] Mobile Phone: [phone] Relation: Son Secondary Emergency Contact: Carriann, [place] Mobile Phone: [phone] Relation: Relative Preferred language: English Interpreter needed? No  Code Status: DNR Goals of care: Advanced Directive information Advanced Directives 05/04/2020  Does Patient Have a Medical Advance Directive? Yes  Type of Paramedic of [place];Living will;Out of facility DNR (pink MOST or yellow form)  Does patient want to make changes to medical advance directive? No - Patient declined  Copy of [place] in Chart? Yes - validated most recent copy scanned in chart (See row information)  Would patient like information on creating a medical advance directive? -  Pre-existing out of facility DNR order (yellow form or pink MOST form) -     Chief Complaint  Patient presents with  . Acute Visit    Hypokalemia Anemia    HPI:  Pt is a 87 y.o. female seen today for an acute visit for serum K 3.1 4/12//22, the patient denied increased muscle weakness, palpitation, or GI symptoms.    Type 2  diabetes, chronic insulin dependent Lantus 45u qam, 20 u qpm,Novolog 5u with meals  CBG >100 per endocrinology. Takes Metformin 571m qd. CHF/Edema chronic, Takes Spironolactone, Torsemide since 04/28/20. EF 60-65%. Bun/creatBun/creat 22/1.22 05/03/20 CKD Na 139, K 3.1, Bun 22, creat 1.22, eGFR 40 05/03/20 HTN, takes Metoprolol Afib, asymptomatic, f/u Cardiology.Takes Metoprolol. GERD, takes Pantoprazole, f/u GI, gastric antral vascular ectasia.Hgb 7.8 05/03/20 Hypothyroidism, takes Levothyroxine 740m qd, TSH 2.27 2/24/22t Anxiety, take Lorazepam 0.59m90mhs Pleural effusion, last seen by pulmonology 03/01/20. Last hospitalization 12/2019, underwent thoracentesis 01/13/21 and 2 litter fluid removed, negative culture, probably a transudate from liver cirrhosis  Anemia, takes Folic acid. Gastric antral vascular ectasia. Iron infusion, f/u oncology,  Hgb 9.3, Iron 573/3/22.Hgb 7.9 05/02/91/81coholic cirrhosis with ascites/portal hypertensive gastropathy, takes Lactulose, Aldactone.US Koread 04/29/20 no abnormal fluid in the peritoneum, a small amount of debirs on the gallbladder floor, no shadowing stones, large splenomegaly   Past Medical History:  Diagnosis Date  . Anemia   . Anemia of chronic renal failure, stage 3 (moderate) ([facility]CC[place] [facility] [place]/03/2020  . Angiodysplasia of ascending colon 10/25/2014  . Anxiety   . Arthritis   . Borderline diabetes   . Cancer of the skin, basal cell 09/03/2012  . Cirrhosis (HC[place] . Diabetes mellitus without complication (HCC[place] . Diverticular disease   . 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 GERD (gastroesophageal reflux disease)   . Hypertension   . Iron deficiency anemia due to chronic blood loss 01/19/2015  . Portal hypertensive gastropathy (HCC[place]/12/2016   ? Some GAVE also  . Thyroid disease    Past  Surgical History:  Procedure Laterality Date  . ABDOMINAL HYSTERECTOMY    . APPENDECTOMY    .  COLONOSCOPY    . ESOPHAGOGASTRODUODENOSCOPY    . ESOPHAGOGASTRODUODENOSCOPY (EGD) WITH PROPOFOL N/A 02/22/2020   Procedure: ESOPHAGOGASTRODUODENOSCOPY (EGD) WITH PROPOFOL;  Surgeon: Gatha Mayer, MD;  Location: [facility];  Service: Endoscopy;  Laterality: N/A;  . IR PARACENTESIS  05/25/2016  . IR THORACENTESIS ASP PLEURAL SPACE W/IMG GUIDE  02/23/2020  . LEG SKIN LESION  BIOPSY / EXCISION  12/11/14  . MOHS SURGERY     ankle  . TONSILLECTOMY    . TOTAL HIP ARTHROPLASTY Bilateral 1993, 2006  . UPPER GASTROINTESTINAL ENDOSCOPY  09/09/2017    Allergies  Allergen Reactions  . Tizanidine Other (See Comments)    Hallucinate, confused     Allergies as of 05/04/2020      Reactions   Tizanidine Other (See Comments)   Hallucinate, confused       Medication List       Accurate as of May 04, 2020 11:59 PM. If you have any questions, ask your nurse or doctor.        Accu-Chek FastClix Lancets Misc CHECK FOR BLOOD SUGAR DAILY   acetaminophen 325 MG tablet Commonly known as: TYLENOL Take 650 mg by mouth every 6 (six) hours as needed.   folic acid 1 MG tablet Commonly known as: FOLVITE Take 1 tablet (1 mg total) by mouth daily.   glucose blood test strip 1 each by Other route 2 (two) times daily. Use as instructed-Check blood glucose prior to administering insulin   insulin aspart 100 UNIT/ML injection Commonly known as: novoLOG Inject 12 Units into the skin 3 (three) times daily. Per Sliding scale   insulin glargine 100 UNIT/ML injection Commonly known as: LANTUS Inject 45 Units into the skin every morning.   insulin glargine 100 UNIT/ML injection Commonly known as: LANTUS Inject 20 Units into the skin at bedtime.   lactulose 10 GM/15ML solution Commonly known as: CHRONULAC Take by mouth daily.   latanoprost 0.005 % ophthalmic solution Commonly known as: XALATAN Place 1  drop into both eyes at bedtime.   levothyroxine 75 MCG tablet Commonly known as: SYNTHROID Take 1 tablet (75 mcg total) by mouth daily.   LORazepam 0.5 MG tablet Commonly known as: ATIVAN Take 1 tablet (0.5 mg total) by mouth at bedtime.   metFORMIN 500 MG 24 hr tablet Commonly known as: GLUCOPHAGE-XR Take 1 tablet (500 mg total) by mouth daily.   metoprolol succinate 25 MG 24 hr tablet Commonly known as: TOPROL-XL Take 12.5 mg by mouth daily.   pantoprazole 40 MG tablet Commonly known as: PROTONIX Take 1 tablet (40 mg total) by mouth daily.   simvastatin 20 MG tablet Commonly known as: ZOCOR TAKE ONE TABLET BY MOUTH EVERY NIGHT AT BEDTIME   spironolactone 50 MG tablet Commonly known as: Aldactone Take 1 tablet (50 mg total) by mouth daily.   vitamin C 1000 MG tablet Take 1,000 mg by mouth at bedtime.   vitamin E 180 MG (400 UNITS) capsule Take 400 Units by mouth every evening.       Review of Systems  Constitutional: Negative for appetite change, fatigue and fever.       Weight gained about #10Ibs in the past 2 wks.   HENT: Positive for hearing loss. Negative for congestion and trouble swallowing.   Eyes: Negative for visual disturbance.  Respiratory: Positive for shortness of breath. Negative for cough.        DOE  Cardiovascular: Positive for leg swelling. Negative for chest pain and palpitations.  Gastrointestinal: Negative for  abdominal pain and constipation.  Genitourinary: Negative for difficulty urinating, dysuria and urgency.  Musculoskeletal: Positive for arthralgias. Negative for gait problem.  Skin: Negative for color change.  Neurological: Negative for speech difficulty, weakness and light-headedness.  Psychiatric/Behavioral: Negative for behavioral problems and sleep disturbance. The patient is not nervous/anxious.     Immunization History  Administered Date(s) Administered  . Fluad Quad(high Dose 65+) 10/27/2018, 02/09/2020  . Hepatitis B,  adult 05/12/2014  . Hepatitis B, ped/adol 08/10/2014  . Influenza Split 11/02/2009, 10/15/2010  . Influenza, High Dose Seasonal PF 10/13/2014, 01/26/2017  . Influenza, Quadrivalent, Recombinant, Inj, Pf 09/30/2017  . Influenza, Seasonal, Injecte, Preservative Fre 10/13/2014  . Influenza,inj,quad, With Preservative 11/22/2016  . Influenza-Unspecified 11/25/2013, 10/06/2015, 10/22/2017  . Moderna Sars-Covid-2 Vaccination 03/26/2019, 04/23/2019  . Pneumococcal Conjugate-13 05/17/2014  . Pneumococcal Polysaccharide-23 10/06/2015  . Tdap 10/02/2011, 11/06/2016  . Zoster 01/22/2005   Pertinent  Health Maintenance Due  Topic Date Due  . FOOT EXAM  05/12/2015  . URINE MICROALBUMIN  04/27/2020  . OPHTHALMOLOGY EXAM  05/17/2020  . HEMOGLOBIN A1C  07/13/2020  . INFLUENZA VACCINE  08/22/2020  . DEXA SCAN  Completed  . PNA vac Low Risk Adult  Addressed  . COLONOSCOPY (Pts 45-63yr Insurance coverage will need to be confirmed)  Discontinued   Fall Risk  04/28/2019 01/09/2018 10/26/2016 10/09/2016 11/25/2015  Falls in the past year? 1 0 Yes No Yes  Number falls in past yr: 0 - 2 or more - 2 or more  Injury with Fall? 0 - No - Yes  Comment - - - - -  Risk Factor Category  - - High Fall Risk - High Fall Risk  Risk for fall due to : - - Impaired balance/gait - -  Follow up Falls evaluation completed - - - Falls prevention discussed   Functional Status Survey:    Vitals:   05/04/20 1636  BP: 110/70  Pulse: (!) 126  Resp: (!) 22  Temp: 99.6 F (37.6 C)  SpO2: 95%  Weight: 184 lb 12.8 oz (83.8 kg)  Height: 5' 7"  (1.702 m)   Body mass index is 28.94 kg/m. Physical Exam Vitals and nursing note reviewed.  Constitutional:      Appearance: Normal appearance.  HENT:     Head: Normocephalic and atraumatic.     Mouth/Throat:     Mouth: Mucous membranes are moist.  Eyes:     Extraocular Movements: Extraocular movements intact.     Conjunctiva/sclera: Conjunctivae normal.     Pupils: Pupils  are equal, round, and reactive to light.  Cardiovascular:     Rate and Rhythm: Tachycardia present. Rhythm irregular.     Heart sounds: No murmur heard.   Pulmonary:     Effort: Pulmonary effort is normal.     Breath sounds: Rales present. No wheezing or rhonchi.     Comments: bibasilar Abdominal:     General: Bowel sounds are normal.     Palpations: Abdomen is soft.     Tenderness: There is no abdominal tenderness.  Musculoskeletal:     Cervical back: Normal range of motion and neck supple.     Right lower leg: Edema present.     Left lower leg: Edema present.     Comments: 2+ edema BLE. C/o lower back at night.   Skin:    General: Skin is warm and dry.     Comments: Venous insufficiency skin changes BLE  Neurological:     General: No focal deficit present.  Mental Status: She is alert. Mental status is at baseline.     Gait: Gait abnormal.     Comments: Oriented to person, place.   Psychiatric:        Mood and Affect: Mood normal.        Behavior: Behavior normal.        Thought Content: Thought content normal.        Judgment: Judgment normal.     Labs reviewed: Recent Labs    12/14/19 1113 01/12/20 1241 01/13/20 0610 01/14/20 0618 02/18/20 0946 03/01/20 1336 03/17/20 0000 03/24/20 1452 04/19/20 0000  NA 137   < > 141   < > 135 134 139 135 138  K 3.6   < > 3.4*   < > 4.2 4.4 3.7 4.4 3.9  CL 99   < > 105   < > 101 96 103 98 104  CO2 24   < > 23   < > 27 23 28* 31 28*  GLUCOSE 332*   < > 181*   < > 228* 385*  --  242*  --   BUN 13   < > 22   < > 26* 19 26* 24* 19  CREATININE 1.04*   < > 1.11*   < > 1.25* 1.18* 1.1 1.31* 1.0  CALCIUM 8.8   < > 9.2   < > 9.2 9.4 9.1 10.2 8.9  MG 1.5*  --  2.2  --   --  1.6  --   --   --    < > = values in this interval not displayed.   Recent Labs    01/21/20 1137 02/01/20 1446 03/17/20 0000 03/24/20 1452  AST 32 40* 23 26  ALT 16 21 18 16   ALKPHOS 89 95 70 75  BILITOT 1.8* 1.8*  --  1.1  PROT 5.7* 6.1  --  5.9*   ALBUMIN 3.5 3.6 3.1* 3.6   Recent Labs    02/01/20 1446 02/22/20 0930 03/17/20 0000 03/24/20 1452  WBC 5.0 3.3* 2.6 5.5  NEUTROABS 3.7  --  1,604.00 3.9  HGB 10.7* 8.5* 8.8* 9.3*  HCT 32.8* 26.6* 28* 29.7*  MCV 85.3 90.5  --  88.9  PLT 75.0* 58* 55* 77*   Lab Results  Component Value Date   TSH 2.27 03/17/2020   Lab Results  Component Value Date   HGBA1C 7.6 (H) 01/13/2020   Lab Results  Component Value Date   CHOL 112 11/09/2019   HDL 46 (L) 11/09/2019   LDLCALC 52 11/09/2019   TRIG 61 11/09/2019   CHOLHDL 2.4 11/09/2019    Significant Diagnostic Results in last 30 days:  No results found.  Assessment/Plan: CKD (chronic kidney disease) stage 3, GFR 30-59 ml/min (HCC) CKD Na 139, K 3.1, Bun 22, creat 1.22, eGFR 40 05/03/20   Hypokalemia serum K 3.1 4/12//22, the patient denied increased muscle weakness, palpitation, or GI symptoms.  Adding Kcl 40mq po qd, update CMP/eGFR one week.   Type 2 diabetes mellitus with hyperglycemia, with long-term current use of insulin (HCC) Type 2 diabetes, chronic insulin dependent Lantus 45u qam, 20 u qpm,Novolog 5u with meals  CBG >100 per endocrinology. Takes Metformin 5059mqd.   Chronic diastolic (congestive) heart failure (HCC) CHF/Edema chronic, Takes Spironolactone, Torsemide since 04/28/20. EF 60-65%. Bun/creatBun/creat 22/1.22 05/03/20   Essential hypertension Blood pressure is controlled, continue Metoprolol   Chronic atrial fibrillation (HCC) asymptomatic, rapid heart beats at times, f/u Cardiology.Takes Metoprolol.   GERD (  gastroesophageal reflux disease)  takes Pantoprazole, f/u GI, gastric antral vascular ectasia.Hgb 7.8 05/03/20   Adult hypothyroidism  takes Levothyroxine 53mg qd, TSH 2.27 2/24/22t   Anxiety take Lorazepam 0.543mqhs   Pleural effusion on right Pleural effusion, last seen by pulmonology 03/01/20. Last hospitalization 12/2019, underwent thoracentesis  01/13/21 and 2 litter fluid removed, negative culture, probably a transudate from liver cirrhosis    Anemia Anemia, takes Folic acid. Gastric antral vascular ectasia. Iron infusion, f/u oncology,  Hgb 9.3, Iron 573/3/22.Hgb 7.9 05/03/20 05/12/20 Na 138, K 3.7, Bun 21, creat 1.18, total bilirubin 1.5. wbc 3.6, Hgb 7.7, plt 57, neutrophils 6412.5%Alcoholic cirrhosis of liver with ascites (HCC) Alcoholic cirrhosis with ascites/portal hypertensive gastropathy, takes Lactulose, Aldactone.USKoreabd 04/29/20 no abnormal fluid in the peritoneum, a small amount of debirs on the gallbladder floor, no shadowing stones, large splenomegaly 05/12/20 Na 138, K 3.7, Bun 21, creat 1.18, total bilirubin 1.5. wbc 3.6, Hgb 7.7, plt 57, neutrophils 64.8%    Bilirubinemia 05/12/20 Na 138, K 3.7, Bun 21, creat 1.18, total bilirubin 1.5. wbc 3.6, Hgb 7.7, plt 57, neutrophils 64.8% 05/13/20 repeat CBC/diff, CMP/eGFR one week.     Family/ staff Communication: plan of care reviewed with the patient and charge nurse.   Labs/tests ordered:  CBC/diff, CMP/eGFR one week  Time spend 40 minutes.

## 2020-05-04 NOTE — Assessment & Plan Note (Signed)
CKD Na 139, K 3.1, Bun 22, creat 1.22, eGFR 40 05/03/20

## 2020-05-04 NOTE — Telephone Encounter (Signed)
Pt fell today coming out of the bathroom but not because she was dizzy or passed out. Pt's BP has been 110/70 and 104/58. How do you advise?

## 2020-05-04 NOTE — Telephone Encounter (Signed)
NP was at the facility and wanted them to contact you due to her heart rate being up. Her heart rate has been in the lower 100's since 2/22.

## 2020-05-10 ENCOUNTER — Ambulatory Visit: Payer: HMO | Admitting: Family Medicine

## 2020-05-13 NOTE — Assessment & Plan Note (Signed)
05/12/20 Na 138, K 3.7, Bun 21, creat 1.18, total bilirubin 1.5. wbc 3.6, Hgb 7.7, plt 57, neutrophils 64.8% 05/13/20 repeat CBC/diff, CMP/eGFR one week.

## 2020-05-16 ENCOUNTER — Encounter: Payer: Self-pay | Admitting: *Deleted

## 2020-05-16 ENCOUNTER — Telehealth: Payer: Self-pay

## 2020-05-16 NOTE — Telephone Encounter (Signed)
Called and left a vm with appts per sch/staff message   Jessica Dennis

## 2020-05-16 NOTE — Progress Notes (Unsigned)
CBC and CMET received via fax from [facility].  Results reviewed by Dr. Marin Olp.  Order received for pt to come in for labs/Sarah NP and possible transfusion.  Message sent to scheduling.

## 2020-05-17 ENCOUNTER — Telehealth: Payer: Self-pay

## 2020-05-17 NOTE — Telephone Encounter (Signed)
Per chat, pt needs to be here at 7:45 for blood, type and cross, jill is aware  Jessica Dennis

## 2020-05-17 NOTE — Telephone Encounter (Signed)
Pts daughter in law Sharee Pimple called and pt has to have all appts on the same day, appts have been moved and she is aware of the new times   Jessica Dennis

## 2020-05-18 ENCOUNTER — Encounter: Payer: Self-pay | Admitting: Family

## 2020-05-18 ENCOUNTER — Ambulatory Visit: Payer: HMO | Admitting: Family

## 2020-05-18 ENCOUNTER — Other Ambulatory Visit: Payer: Self-pay

## 2020-05-18 ENCOUNTER — Other Ambulatory Visit: Payer: HMO

## 2020-05-18 ENCOUNTER — Inpatient Hospital Stay: Payer: HMO | Attending: Hematology & Oncology | Admitting: Family

## 2020-05-18 ENCOUNTER — Inpatient Hospital Stay: Payer: HMO

## 2020-05-18 ENCOUNTER — Telehealth: Payer: Self-pay

## 2020-05-18 ENCOUNTER — Other Ambulatory Visit: Payer: Self-pay | Admitting: *Deleted

## 2020-05-18 VITALS — BP 110/59 | HR 78 | Temp 98.1°F | Resp 18

## 2020-05-18 VITALS — BP 119/60 | HR 115 | Temp 99.0°F | Resp 20 | Ht 67.0 in | Wt 180.4 lb

## 2020-05-18 DIAGNOSIS — D649 Anemia, unspecified: Secondary | ICD-10-CM

## 2020-05-18 DIAGNOSIS — D500 Iron deficiency anemia secondary to blood loss (chronic): Secondary | ICD-10-CM | POA: Insufficient documentation

## 2020-05-18 DIAGNOSIS — K219 Gastro-esophageal reflux disease without esophagitis: Secondary | ICD-10-CM

## 2020-05-18 DIAGNOSIS — D6959 Other secondary thrombocytopenia: Secondary | ICD-10-CM | POA: Diagnosis not present

## 2020-05-18 DIAGNOSIS — R195 Other fecal abnormalities: Secondary | ICD-10-CM

## 2020-05-18 DIAGNOSIS — K922 Gastrointestinal hemorrhage, unspecified: Secondary | ICD-10-CM | POA: Insufficient documentation

## 2020-05-18 DIAGNOSIS — N1831 Chronic kidney disease, stage 3a: Secondary | ICD-10-CM

## 2020-05-18 DIAGNOSIS — Z79899 Other long term (current) drug therapy: Secondary | ICD-10-CM | POA: Insufficient documentation

## 2020-05-18 DIAGNOSIS — D631 Anemia in chronic kidney disease: Secondary | ICD-10-CM | POA: Insufficient documentation

## 2020-05-18 DIAGNOSIS — R161 Splenomegaly, not elsewhere classified: Secondary | ICD-10-CM | POA: Diagnosis not present

## 2020-05-18 DIAGNOSIS — K5520 Angiodysplasia of colon without hemorrhage: Secondary | ICD-10-CM

## 2020-05-18 DIAGNOSIS — R5383 Other fatigue: Secondary | ICD-10-CM | POA: Diagnosis not present

## 2020-05-18 DIAGNOSIS — N1832 Chronic kidney disease, stage 3b: Secondary | ICD-10-CM

## 2020-05-18 DIAGNOSIS — G629 Polyneuropathy, unspecified: Secondary | ICD-10-CM | POA: Insufficient documentation

## 2020-05-18 DIAGNOSIS — K7460 Unspecified cirrhosis of liver: Secondary | ICD-10-CM | POA: Diagnosis not present

## 2020-05-18 LAB — CBC WITH DIFFERENTIAL (CANCER CENTER ONLY)
Abs Immature Granulocytes: 0.02 10*3/uL (ref 0.00–0.07)
Basophils Absolute: 0 10*3/uL (ref 0.0–0.1)
Basophils Relative: 1 %
Eosinophils Absolute: 0.2 10*3/uL (ref 0.0–0.5)
Eosinophils Relative: 3 %
HCT: 28.8 % — ABNORMAL LOW (ref 36.0–46.0)
Hemoglobin: 8.8 g/dL — ABNORMAL LOW (ref 12.0–15.0)
Immature Granulocytes: 0 %
Lymphocytes Relative: 15 %
Lymphs Abs: 0.8 10*3/uL (ref 0.7–4.0)
MCH: 24.3 pg — ABNORMAL LOW (ref 26.0–34.0)
MCHC: 30.6 g/dL (ref 30.0–36.0)
MCV: 79.6 fL — ABNORMAL LOW (ref 80.0–100.0)
Monocytes Absolute: 0.5 10*3/uL (ref 0.1–1.0)
Monocytes Relative: 9 %
Neutro Abs: 3.8 10*3/uL (ref 1.7–7.7)
Neutrophils Relative %: 72 %
Platelet Count: 68 10*3/uL — ABNORMAL LOW (ref 150–400)
RBC: 3.62 MIL/uL — ABNORMAL LOW (ref 3.87–5.11)
RDW: 17.5 % — ABNORMAL HIGH (ref 11.5–15.5)
WBC Count: 5.3 10*3/uL (ref 4.0–10.5)
nRBC: 0 % (ref 0.0–0.2)

## 2020-05-18 LAB — IRON AND TIBC
Iron: 34 ug/dL — ABNORMAL LOW (ref 41–142)
Saturation Ratios: 10 % — ABNORMAL LOW (ref 21–57)
TIBC: 326 ug/dL (ref 236–444)
UIBC: 292 ug/dL (ref 120–384)

## 2020-05-18 LAB — CMP (CANCER CENTER ONLY)
ALT: 16 U/L (ref 0–44)
AST: 24 U/L (ref 15–41)
Albumin: 3.4 g/dL — ABNORMAL LOW (ref 3.5–5.0)
Alkaline Phosphatase: 87 U/L (ref 38–126)
Anion gap: 10 (ref 5–15)
BUN: 23 mg/dL (ref 8–23)
CO2: 27 mmol/L (ref 22–32)
Calcium: 9.5 mg/dL (ref 8.9–10.3)
Chloride: 98 mmol/L (ref 98–111)
Creatinine: 1.25 mg/dL — ABNORMAL HIGH (ref 0.44–1.00)
GFR, Estimated: 42 mL/min — ABNORMAL LOW (ref >60)
Glucose, Bld: 213 mg/dL — ABNORMAL HIGH (ref 70–99)
Potassium: 4 mmol/L (ref 3.5–5.1)
Sodium: 135 mmol/L (ref 135–145)
Total Bilirubin: 1.7 mg/dL — ABNORMAL HIGH (ref 0.3–1.2)
Total Protein: 6.2 g/dL — ABNORMAL LOW (ref 6.5–8.1)

## 2020-05-18 LAB — RETICULOCYTES
Immature Retic Fract: 25.1 % — ABNORMAL HIGH (ref 2.3–15.9)
RBC.: 3.63 MIL/uL — ABNORMAL LOW (ref 3.87–5.11)
Retic Count, Absolute: 112.5 10*3/uL (ref 19.0–186.0)
Retic Ct Pct: 3.1 % (ref 0.4–3.1)

## 2020-05-18 LAB — FERRITIN: Ferritin: 16 ng/mL (ref 11–307)

## 2020-05-18 LAB — SAMPLE TO BLOOD BANK

## 2020-05-18 LAB — PREPARE RBC (CROSSMATCH)

## 2020-05-18 MED ORDER — FUROSEMIDE 10 MG/ML IJ SOLN
20.0000 mg | Freq: Once | INTRAMUSCULAR | Status: DC
Start: 2020-05-18 — End: 2020-05-18

## 2020-05-18 MED ORDER — SODIUM CHLORIDE 0.9 % IV SOLN
Freq: Once | INTRAVENOUS | Status: AC
  Filled 2020-05-18: qty 250

## 2020-05-18 MED ORDER — ACETAMINOPHEN 325 MG PO TABS
650.0000 mg | ORAL_TABLET | Freq: Once | ORAL | Status: AC
  Administered 2020-05-18: 650 mg via ORAL

## 2020-05-18 MED ORDER — SODIUM CHLORIDE 0.9 % IV SOLN
200.0000 mg | Freq: Once | INTRAVENOUS | Status: DC
  Administered 2020-05-18: 200 mg via INTRAVENOUS
  Filled 2020-05-18: qty 200

## 2020-05-18 MED ORDER — DIPHENHYDRAMINE HCL 25 MG PO CAPS
25.0000 mg | ORAL_CAPSULE | Freq: Once | ORAL | Status: AC
  Administered 2020-05-18: 25 mg via ORAL

## 2020-05-18 MED ORDER — FUROSEMIDE 10 MG/ML IJ SOLN
INTRAMUSCULAR | Status: AC
  Filled 2020-05-18: qty 4

## 2020-05-18 MED ORDER — ACETAMINOPHEN 325 MG PO TABS
ORAL_TABLET | ORAL | Status: AC
  Filled 2020-05-18: qty 2

## 2020-05-18 MED ORDER — DIPHENHYDRAMINE HCL 25 MG PO CAPS
ORAL_CAPSULE | ORAL | Status: AC
  Filled 2020-05-18: qty 1

## 2020-05-18 MED ORDER — SODIUM CHLORIDE 0.9% IV SOLUTION
250.0000 mL | Freq: Once | INTRAVENOUS | Status: AC
  Administered 2020-05-18: 250 mL via INTRAVENOUS
  Filled 2020-05-18: qty 250

## 2020-05-18 NOTE — Progress Notes (Signed)
Hematology and Oncology Follow Up Visit  Jessica Dennis [id number] Apr 04, 1932 87 y.o. 05/18/2020   Principle Diagnosis:  Chronic thrombocytopenia secondary to cirrhosis and splenomegaly Iron deficiency anemia secondary to GI bleed  Current Therapy:        IV iron as indicated Red blood cell transfusion as needed for variceal bleeding   Interim History:  Jessica Dennis is here today with her daughter in law for follow-up. She had lab work drawn last [facility] at [facility]. Hgb at that time was down to 7.7, platelets 57 and WBC count 3.6.  She has tired and fatigued. She ambulates with a rolling walker for added support.  She had 1 fall 2 weeks ago when leaving the bathroom at her home. She states that she did not have her walker and turned too quickly. Thankfully she was not seriously injured.  She has not noted any blood loss. No abnormal bruising, no petechiae.  Neuropathy in her feet is unchanged.  She has significant swelling in both feet at this time. Pedal pulses are 2+. She spends a lot of time in her recliner at home and props up her feet as she can.  No fever, chills, n/v, cough, rash, dizziness, SOB, chest pain, palpitations, abdominal pain or changes in bowel or bladder habits.  She has maintained a good appetite and is staying well hydrated. Her weight is stable at 180 lbs.   ECOG Performance Status: 1 - Symptomatic but completely ambulatory  Medications:  Allergies as of 05/18/2020      Reactions   Tizanidine Other (See Comments)   Hallucinate, confused       Medication List       Accurate as of May 18, 2020  9:03 AM. If you have any questions, ask your nurse or doctor.        Accu-Chek FastClix Lancets Misc CHECK FOR BLOOD SUGAR DAILY   acetaminophen 325 MG tablet Commonly known as: TYLENOL Take 650 mg by mouth every 6 (six) hours as needed.   folic acid 1 MG tablet Commonly known as: FOLVITE Take 1 tablet (1 mg total) by mouth daily.   glucose blood  test strip 1 each by Other route 2 (two) times daily. Use as instructed-Check blood glucose prior to administering insulin   insulin aspart 100 UNIT/ML injection Commonly known as: novoLOG Inject 12 Units into the skin 3 (three) times daily. Per Sliding scale   insulin glargine 100 UNIT/ML injection Commonly known as: LANTUS Inject 45 Units into the skin every morning.   insulin glargine 100 UNIT/ML injection Commonly known as: LANTUS Inject 20 Units into the skin at bedtime.   lactulose 10 GM/15ML solution Commonly known as: CHRONULAC Take by mouth daily.   latanoprost 0.005 % ophthalmic solution Commonly known as: XALATAN Place 1 drop into both eyes at bedtime.   levothyroxine 75 MCG tablet Commonly known as: SYNTHROID Take 1 tablet (75 mcg total) by mouth daily.   LORazepam 0.5 MG tablet Commonly known as: ATIVAN Take 1 tablet (0.5 mg total) by mouth at bedtime.   metFORMIN 500 MG 24 hr tablet Commonly known as: GLUCOPHAGE-XR Take 1 tablet (500 mg total) by mouth daily.   metoprolol succinate 25 MG 24 hr tablet Commonly known as: TOPROL-XL Take 12.5 mg by mouth daily.   pantoprazole 40 MG tablet Commonly known as: PROTONIX Take 1 tablet (40 mg total) by mouth daily.   simvastatin 20 MG tablet Commonly known as: ZOCOR TAKE ONE TABLET BY MOUTH EVERY NIGHT AT  BEDTIME   spironolactone 50 MG tablet Commonly known as: Aldactone Take 1 tablet (50 mg total) by mouth daily.   vitamin C 1000 MG tablet Take 1,000 mg by mouth at bedtime.   vitamin E 180 MG (400 UNITS) capsule Take 400 Units by mouth every evening.       Allergies:  Allergies  Allergen Reactions  . Tizanidine Other (See Comments)    Hallucinate, confused     Past Medical History, Surgical history, Social history, and Family History were reviewed and updated.  Review of Systems: All other 10 point review of systems is negative.   Physical Exam:  height is 5\' 7"  (1.702 m) and weight is 180  lb 6.4 oz (81.8 kg). Her oral temperature is 99 F (37.2 C). Her blood pressure is 119/60 and her pulse is 115 (abnormal). Her respiration is 20 and oxygen saturation is 100%.   Wt Readings from Last 3 Encounters:  05/18/20 180 lb 6.4 oz (81.8 kg)  05/04/20 184 lb 12.8 oz (83.8 kg)  04/28/20 181 lb 3.2 oz (82.2 kg)    Ocular: Sclerae unicteric, pupils equal, round and reactive to light Ear-nose-throat: Oropharynx clear, dentition fair Lymphatic: No cervical or supraclavicular adenopathy Lungs no rales or rhonchi, good excursion bilaterally Heart regular rate and rhythm, no murmur appreciated Abd soft, nontender, positive bowel sounds MSK no focal spinal tenderness, no joint edema Neuro: non-focal, well-oriented, appropriate affect Breasts: Deferred   Lab Results  Component Value Date   WBC 5.3 05/18/2020   HGB 8.8 (L) 05/18/2020   HCT 28.8 (L) 05/18/2020   MCV 79.6 (L) 05/18/2020   PLT 68 (L) 05/18/2020   Lab Results  Component Value Date   FERRITIN 25 03/24/2020   IRON 57 03/24/2020   TIBC 319 03/24/2020   UIBC 262 03/24/2020   IRONPCTSAT 18 (L) 03/24/2020   Lab Results  Component Value Date   RETICCTPCT 3.1 05/18/2020   RBC 3.63 (L) 05/18/2020   No results found for: KPAFRELGTCHN, LAMBDASER, KAPLAMBRATIO No results found for: IGGSERUM, IGA, IGMSERUM No results found for: Odetta Pink, SPEI   Chemistry      Component Value Date/Time   NA 135 05/18/2020 0745   NA 138 04/19/2020 0000   NA 138 12/18/2016 1314   NA 139 03/05/2016 1139   K 4.0 05/18/2020 0745   K 4.3 12/18/2016 1314   K 3.6 03/05/2016 1139   CL 98 05/18/2020 0745   CL 101 12/18/2016 1314   CO2 27 05/18/2020 0745   CO2 28 12/18/2016 1314   CO2 26 03/05/2016 1139   BUN 23 05/18/2020 0745   BUN 19 04/19/2020 0000   BUN 33 (H) 12/18/2016 1314   BUN 14.3 03/05/2016 1139   CREATININE 1.25 (H) 05/18/2020 0745   CREATININE 1.02 (H) 11/09/2019  1137   CREATININE 0.7 03/05/2016 1139   GLU 246 04/19/2020 0000      Component Value Date/Time   CALCIUM 9.5 05/18/2020 0745   CALCIUM 10.0 12/18/2016 1314   CALCIUM 9.3 03/05/2016 1139   ALKPHOS 87 05/18/2020 0745   ALKPHOS 94 (H) 12/18/2016 1314   ALKPHOS 127 03/05/2016 1139   AST 24 05/18/2020 0745   AST 29 03/05/2016 1139   ALT 16 05/18/2020 0745   ALT 26 12/18/2016 1314   ALT 22 03/05/2016 1139   BILITOT 1.7 (H) 05/18/2020 0745   BILITOT 1.75 (H) 03/05/2016 1139       Impression and Plan: Ms. Cotham  is a very pleasant50 yo female with chronic thrombocytopenia and iron deficiency anemia secondary to intermittent GI blood loss. We will give 2 units of blood today as well as IV iron.  She will need one more dose of IV iron next week.  Follow-up in 4 weeks.  They were encouraged to contact our office with any questions or concerns.   [name], NP 4/27/20229:03 AM

## 2020-05-18 NOTE — Patient Instructions (Addendum)
Iron Sucrose injection What is this medicine? IRON SUCROSE (AHY ern SOO krohs) is an iron complex. Iron is used to make healthy red blood cells, which carry oxygen and nutrients throughout the body. This medicine is used to treat iron deficiency anemia in people with chronic kidney disease. This medicine may be used for other purposes; ask your health care provider or pharmacist if you have questions. COMMON BRAND NAME(S): Venofer What should I tell my health care provider before I take this medicine? They need to know if you have any of these conditions:  anemia not caused by low iron levels  heart disease  high levels of iron in the blood  kidney disease  liver disease  an unusual or allergic reaction to iron, other medicines, foods, dyes, or preservatives  pregnant or trying to get pregnant  breast-feeding How should I use this medicine? This medicine is for infusion into a vein. It is given by a health care professional in a hospital or clinic setting. Talk to your pediatrician regarding the use of this medicine in children. While this drug may be prescribed for children as young as 2 years for selected conditions, precautions do apply. Overdosage: If you think you have taken too much of this medicine contact a poison control center or emergency room at once. NOTE: This medicine is only for you. Do not share this medicine with others. What if I miss a dose? It is important not to miss your dose. Call your doctor or health care professional if you are unable to keep an appointment. What may interact with this medicine? Do not take this medicine with any of the following medications:  deferoxamine  dimercaprol  other iron products This medicine may also interact with the following medications:  chloramphenicol  deferasirox This list may not describe all possible interactions. Give your health care provider a list of all the medicines, herbs, non-prescription drugs, or  dietary supplements you use. Also tell them if you smoke, drink alcohol, or use illegal drugs. Some items may interact with your medicine. What should I watch for while using this medicine? Visit your doctor or healthcare professional regularly. Tell your doctor or healthcare professional if your symptoms do not start to get better or if they get worse. You may need blood work done while you are taking this medicine. You may need to follow a special diet. Talk to your doctor. Foods that contain iron include: whole grains/cereals, dried fruits, beans, or peas, leafy green vegetables, and organ meats (liver, kidney). What side effects may I notice from receiving this medicine? Side effects that you should report to your doctor or health care professional as soon as possible:  allergic reactions like skin rash, itching or hives, swelling of the face, lips, or tongue  breathing problems  changes in blood pressure  cough  fast, irregular heartbeat  feeling faint or lightheaded, falls  fever or chills  flushing, sweating, or hot feelings  joint or muscle aches/pains  seizures  swelling of the ankles or feet  unusually weak or tired Side effects that usually do not require medical attention (report to your doctor or health care professional if they continue or are bothersome):  diarrhea  feeling achy  headache  irritation at site where injected  nausea, vomiting  stomach upset tiredness Blood Transfusion, Adult, Care After This sheet gives you information about how to care for yourself after your procedure. Your doctor may also give you more specific instructions. If you have problems  or questions, contact your doctor. What can I expect after the procedure? After the procedure, it is common to have:  Bruising and soreness at the IV site.  A fever or chills on the day of the procedure. This may be your body's response to the new blood cells received.  A headache. Follow  these instructions at home: Insertion site care  Follow instructions from your doctor about how to take care of your insertion site. This is where an IV tube was put into your vein. Make sure you: ? Wash your hands with soap and water before and after you change your bandage (dressing). If you cannot use soap and water, use hand sanitizer. ? Change your bandage as told by your doctor.  Check your insertion site every day for signs of infection. Check for: ? Redness, swelling, or pain. ? Bleeding from the site. ? Warmth. ? Pus or a bad smell.      General instructions  Take over-the-counter and prescription medicines only as told by your doctor.  Rest as told by your doctor.  Go back to your normal activities as told by your doctor.  Keep all follow-up visits as told by your doctor. This is important. Contact a doctor if:  You have itching or red, swollen areas of skin (hives).  You feel worried or nervous (anxious).  You feel weak after doing your normal activities.  You have redness, swelling, warmth, or pain around the insertion site.  You have blood coming from the insertion site, and the blood does not stop with pressure.  You have pus or a bad smell coming from the insertion site. Get help right away if:  You have signs of a serious reaction. This may be coming from an allergy or the body's defense system (immune system). Signs include: ? Trouble breathing or shortness of breath. ? Swelling of the face or feeling warm (flushed). ? Fever or chills. ? Head, chest, or back pain. ? Dark pee (urine) or blood in the pee. ? Widespread rash. ? Fast heartbeat. ? Feeling dizzy or light-headed. You may receive your blood transfusion in an outpatient setting. If so, you will be told whom to contact to report any reactions. These symptoms may be an emergency. Do not wait to see if the symptoms will go away. Get medical help right away. Call your local emergency services (911  in the [place] Do not drive yourself to the hospital. Summary  Bruising and soreness at the IV site are common.  Check your insertion site every day for signs of infection.  Rest as told by your doctor. Go back to your normal activities as told by your doctor.  Get help right away if you have signs of a serious reaction. This information is not intended to replace advice given to you by your health care provider. Make sure you discuss any questions you have with your health care provider. Document Revised: 07/03/2018 Document Reviewed: 07/03/2018 Elsevier Patient Education  2021 [place].   This list may not describe all possible side effects. Call your doctor for medical advice about side effects. You may report side effects to FDA at 1-800-FDA-1088. Where should I keep my medicine? This drug is given in a hospital or clinic and will not be stored at home. NOTE: This sheet is a summary. It may not cover all possible information. If you have questions about this medicine, talk to your doctor, pharmacist, or health care provider.  2021 Elsevier/Gold Standard (2010-10-19 17:14:35)

## 2020-05-18 NOTE — Telephone Encounter (Signed)
Jessica Dennis aware of iron tx appt next week along with r/s of 5/2 appt to 5/27   Jessica Dennis

## 2020-05-19 LAB — TYPE AND SCREEN
ABO/RH(D): A NEG
Antibody Screen: NEGATIVE
Unit division: 0
Unit division: 0

## 2020-05-19 LAB — BPAM RBC
Blood Product Expiration Date: 202205132359
Blood Product Expiration Date: 202205222359
ISSUE DATE / TIME: 202204270956
ISSUE DATE / TIME: 202204270956
Unit Type and Rh: 600
Unit Type and Rh: 600

## 2020-05-23 ENCOUNTER — Inpatient Hospital Stay: Payer: HMO

## 2020-05-23 ENCOUNTER — Inpatient Hospital Stay: Payer: HMO | Admitting: Hematology & Oncology

## 2020-05-26 ENCOUNTER — Inpatient Hospital Stay: Payer: HMO | Attending: Hematology & Oncology

## 2020-05-26 ENCOUNTER — Other Ambulatory Visit: Payer: Self-pay

## 2020-05-26 VITALS — BP 124/47 | HR 78 | Resp 18

## 2020-05-26 DIAGNOSIS — D631 Anemia in chronic kidney disease: Secondary | ICD-10-CM

## 2020-05-26 DIAGNOSIS — D500 Iron deficiency anemia secondary to blood loss (chronic): Secondary | ICD-10-CM

## 2020-05-26 DIAGNOSIS — N1832 Chronic kidney disease, stage 3b: Secondary | ICD-10-CM

## 2020-05-26 DIAGNOSIS — K5520 Angiodysplasia of colon without hemorrhage: Secondary | ICD-10-CM

## 2020-05-26 DIAGNOSIS — K219 Gastro-esophageal reflux disease without esophagitis: Secondary | ICD-10-CM

## 2020-05-26 DIAGNOSIS — R195 Other fecal abnormalities: Secondary | ICD-10-CM

## 2020-05-26 MED ORDER — SODIUM CHLORIDE 0.9 % IV SOLN
Freq: Once | INTRAVENOUS | Status: AC
Start: 2020-05-26 — End: 2020-05-26
  Filled 2020-05-26: qty 250

## 2020-05-26 MED ORDER — SODIUM CHLORIDE 0.9 % IV SOLN
510.0000 mg | Freq: Once | INTRAVENOUS | Status: AC
  Administered 2020-05-26: 510 mg via INTRAVENOUS
  Filled 2020-05-26: qty 17

## 2020-05-26 NOTE — Patient Instructions (Signed)
Ferumoxytol injection What is this medicine? FERUMOXYTOL is an iron complex. Iron is used to make healthy red blood cells, which carry oxygen and nutrients throughout the body. This medicine is used to treat iron deficiency anemia. This medicine may be used for other purposes; ask your health care provider or pharmacist if you have questions. COMMON BRAND NAME(S): Feraheme What should I tell my health care provider before I take this medicine? They need to know if you have any of these conditions:  anemia not caused by low iron levels  high levels of iron in the blood  magnetic resonance imaging (MRI) test scheduled  an unusual or allergic reaction to iron, other medicines, foods, dyes, or preservatives  pregnant or trying to get pregnant  breast-feeding How should I use this medicine? This medicine is for injection into a vein. It is given by a health care professional in a hospital or clinic setting. Talk to your pediatrician regarding the use of this medicine in children. Special care may be needed. Overdosage: If you think you have taken too much of this medicine contact a poison control center or emergency room at once. NOTE: This medicine is only for you. Do not share this medicine with others. What if I miss a dose? It is important not to miss your dose. Call your doctor or health care professional if you are unable to keep an appointment. What may interact with this medicine? This medicine may interact with the following medications:  other iron products This list may not describe all possible interactions. Give your health care provider a list of all the medicines, herbs, non-prescription drugs, or dietary supplements you use. Also tell them if you smoke, drink alcohol, or use illegal drugs. Some items may interact with your medicine. What should I watch for while using this medicine? Visit your doctor or healthcare professional regularly. Tell your doctor or healthcare  professional if your symptoms do not start to get better or if they get worse. You may need blood work done while you are taking this medicine. You may need to follow a special diet. Talk to your doctor. Foods that contain iron include: whole grains/cereals, dried fruits, beans, or peas, leafy green vegetables, and organ meats (liver, kidney). What side effects may I notice from receiving this medicine? Side effects that you should report to your doctor or health care professional as soon as possible:  allergic reactions like skin rash, itching or hives, swelling of the face, lips, or tongue  breathing problems  changes in blood pressure  feeling faint or lightheaded, falls  fever or chills  flushing, sweating, or hot feelings  swelling of the ankles or feet Side effects that usually do not require medical attention (report to your doctor or health care professional if they continue or are bothersome):  diarrhea  headache  nausea, vomiting  stomach pain This list may not describe all possible side effects. Call your doctor for medical advice about side effects. You may report side effects to FDA at 1-800-FDA-1088. Where should I keep my medicine? This drug is given in a hospital or clinic and will not be stored at home. NOTE: This sheet is a summary. It may not cover all possible information. If you have questions about this medicine, talk to your doctor, pharmacist, or health care provider.  2021 Elsevier/Gold Standard (2016-02-27 20:21:10)  

## 2020-06-03 LAB — HEPATIC FUNCTION PANEL
ALT: 16 (ref 7–35)
AST: 24 (ref 13–35)
Alkaline Phosphatase: 85 (ref 25–125)
Bilirubin, Total: 1.2

## 2020-06-03 LAB — BASIC METABOLIC PANEL
BUN: 19 (ref 4–21)
CO2: 24 — AB (ref 13–22)
Chloride: 106 (ref 99–108)
Creatinine: 0.9 (ref 0.5–1.1)
Glucose: 243
Potassium: 4.2 (ref 3.4–5.3)
Sodium: 137 (ref 137–147)

## 2020-06-03 LAB — COMPREHENSIVE METABOLIC PANEL
Albumin: 3.2 — AB (ref 3.5–5.0)
Calcium: 9.4 (ref 8.7–10.7)
Globulin: 2.3

## 2020-06-08 ENCOUNTER — Telehealth: Payer: Self-pay | Admitting: Pharmacist

## 2020-06-08 NOTE — Chronic Care Management (AMB) (Signed)
Chronic Care Management Pharmacy Assistant   Name: Jessica Dennis  MRN: [id number] DOB: [birth year] .  Reason for Encounter: Disease State Diabetes Mellitus    Recent office visits:  02/09/2020 Garnet Koyanagi, [facility] follow up. Flu vaccine given. Return in 3 months 01/12/2020 Aviva Signs, DO (Video visit) Seen for Shortness of breath.   Recent consult visits:  03/24/2020 Burney Gauze, MD (Oncology) Hematology and oncology follow up visit. Follow up in 3-4 weeks. 03/01/2020 Berniece Salines, DO (Cardiology) Seen for NSVT. Increased lasix from 40 mg daily to 20 mg on Mondays, Wednesdays and Fridays. Follow up in 6 months. 03/01/2020 Freda Jackson, MD (Pulmonary) Consult. Follow up in 3 months. 02/01/2020 Silvano Rusk, MD (Gastroenterology) General follow up. 01/20/2021 Burney Gauze, MD (Oncology) General follow up. Follow up in 1 month.  Hospital visits:  Medication Reconciliation was completed by comparing discharge summary, patient's EMR and Pharmacy list, and upon discussion with patient.  Admitted to the hospital on 01/12/2020 due to Acute on chronic diastolic CHF. Discharge date was 01/17/2020. Discharged from [facility]?Medications Started at [facility] Discharge:?? None noted  Medication Changes at Hospital Discharge: None noted  Medications Discontinued at Hospital Discharge: None noted  Medications that remain the same after Hospital Discharge:??  -All other medications will remain the same.    Medications: Outpatient Encounter Medications as of 06/08/2020  Medication Sig  . Accu-Chek FastClix Lancets MISC CHECK FOR BLOOD SUGAR DAILY  . acetaminophen (TYLENOL) 325 MG tablet Take 650 mg by mouth every 6 (six) hours as needed.  . Ascorbic Acid (VITAMIN C) 1000 MG tablet Take 1,000 mg by mouth at bedtime.  . folic acid (FOLVITE) 1 MG tablet Take 1 tablet (1 mg total) by mouth daily.  Marland Kitchen glucose blood test strip 1 each by Other route 2  (two) times daily. Use as instructed-Check blood glucose prior to administering insulin  . insulin aspart (NOVOLOG) 100 UNIT/ML injection Inject 12 Units into the skin 3 (three) times daily. Per Sliding scale  . insulin glargine (LANTUS) 100 UNIT/ML injection Inject 45 Units into the skin every morning.  . insulin glargine (LANTUS) 100 UNIT/ML injection Inject 20 Units into the skin at bedtime.  Marland Kitchen lactulose (CHRONULAC) 10 GM/15ML solution Take by mouth daily.  Marland Kitchen latanoprost (XALATAN) 0.005 % ophthalmic solution Place 1 drop into both eyes at bedtime.   Marland Kitchen levothyroxine (SYNTHROID) 75 MCG tablet Take 1 tablet (75 mcg total) by mouth daily.  Marland Kitchen LORazepam (ATIVAN) 0.5 MG tablet Take 1 tablet (0.5 mg total) by mouth at bedtime.  . metFORMIN (GLUCOPHAGE-XR) 500 MG 24 hr tablet Take 1 tablet (500 mg total) by mouth daily.  . metoprolol succinate (TOPROL-XL) 25 MG 24 hr tablet Take 12.5 mg by mouth daily.  . pantoprazole (PROTONIX) 40 MG tablet Take 1 tablet (40 mg total) by mouth daily.  . simvastatin (ZOCOR) 20 MG tablet TAKE ONE TABLET BY MOUTH EVERY NIGHT AT BEDTIME  . spironolactone (ALDACTONE) 50 MG tablet Take 1 tablet (50 mg total) by mouth daily.  . vitamin E 180 MG (400 UNITS) capsule Take 400 Units by mouth every evening.   No facility-administered encounter medications on file as of 06/08/2020.   Recent Relevant Labs: Lab Results  Component Value Date/Time   HGBA1C 7.6 (H) 01/13/2020 06:10 AM   HGBA1C 6.3 04/28/2019 11:39 AM   MICROALBUR 1.6 04/28/2019 11:39 AM   MICROALBUR 1.0 10/27/2018 02:43 PM    Kidney Function Lab Results  Component  Value Date/Time   CREATININE 1.25 (H) 05/18/2020 07:45 AM   CREATININE 1.0 04/19/2020 12:00 AM   CREATININE 1.31 (H) 03/24/2020 02:52 PM   CREATININE 1.02 (H) 11/09/2019 11:37 AM   CREATININE 1.06 (H) 02/14/2018 03:44 PM   CREATININE 0.7 03/05/2016 11:39 AM   CREATININE 0.7 11/25/2015 11:38 AM   GFR 39.40 (L) 02/18/2020 09:46 AM   GFRNONAA 42  (L) 05/18/2020 07:45 AM   GFRNONAA 85 05/11/2014 10:45 AM   GFRAA 58 04/19/2020 12:00 AM   GFRAA 55 (L) 05/20/2019 01:15 PM   GFRAA >89 05/11/2014 10:45 AM    . Current antihyperglycemic regimen:  o Novolog 100 unit/ml Inject 12 units 3x daily o Metformin 500 mg 1 tab daily o Lantus 100 unit/ml inject 20 units at bedtime  . What recent interventions/DTPs have been made to improve glycemic control:  o None noted . Have there been any recent hospitalizations or ED visits since last visit with CPP? Yes   . Patient  hypoglycemic symptoms, including   . Patient  hyperglycemic symptoms, including   . How often are you checking your blood sugar?   . What are your blood sugars ranging?  o Fasting:  o Before meals: o After meals:  o Bedtime:  .  During the week, how often does your blood glucose drop below 70?  Marland Kitchen Are you checking your feet daily/regularly?   Adherence Review: Is the patient currently on a STATIN medication? Yes Is the patient currently on ACE/ARB medication? No Does the patient have >5 day gap between last estimated fill dates? No    Per patients daughter,[facility] manages patients blood sugar. Patient lives at [facility]. Number not on file to obtain information.  Star Rating Drugs: Metformin 500 mg Last filled:05/30/2020 30 DS Simvastatin 20 mg Last filled:05/17/2020 30 DS  [facility] Clinical Pharmacist Assistant [phone]

## 2020-06-17 ENCOUNTER — Inpatient Hospital Stay: Payer: HMO

## 2020-06-17 ENCOUNTER — Non-Acute Institutional Stay: Payer: HMO | Admitting: Internal Medicine

## 2020-06-17 ENCOUNTER — Encounter: Payer: Self-pay | Admitting: Internal Medicine

## 2020-06-17 ENCOUNTER — Inpatient Hospital Stay: Payer: HMO | Admitting: Family

## 2020-06-17 DIAGNOSIS — E039 Hypothyroidism, unspecified: Secondary | ICD-10-CM

## 2020-06-17 DIAGNOSIS — F419 Anxiety disorder, unspecified: Secondary | ICD-10-CM

## 2020-06-17 DIAGNOSIS — N1832 Chronic kidney disease, stage 3b: Secondary | ICD-10-CM | POA: Diagnosis not present

## 2020-06-17 DIAGNOSIS — K7031 Alcoholic cirrhosis of liver with ascites: Secondary | ICD-10-CM

## 2020-06-17 DIAGNOSIS — I10 Essential (primary) hypertension: Secondary | ICD-10-CM

## 2020-06-17 DIAGNOSIS — D500 Iron deficiency anemia secondary to blood loss (chronic): Secondary | ICD-10-CM

## 2020-06-17 DIAGNOSIS — I482 Chronic atrial fibrillation, unspecified: Secondary | ICD-10-CM

## 2020-06-17 DIAGNOSIS — E1165 Type 2 diabetes mellitus with hyperglycemia: Secondary | ICD-10-CM | POA: Diagnosis not present

## 2020-06-17 DIAGNOSIS — I5032 Chronic diastolic (congestive) heart failure: Secondary | ICD-10-CM

## 2020-06-17 DIAGNOSIS — Z794 Long term (current) use of insulin: Secondary | ICD-10-CM

## 2020-06-17 DIAGNOSIS — K219 Gastro-esophageal reflux disease without esophagitis: Secondary | ICD-10-CM

## 2020-06-17 NOTE — Progress Notes (Signed)
Location: [place] Room Number: [id number] Place of Service:  ALF (13)  Provider:   Code Status: DNR/ Goals of Care:  Advanced Directives 06/17/2020  Does Patient Have a Medical Advance Directive? Yes  Type of Paramedic of [place];Living will;Out of facility DNR (pink MOST or yellow form)  Does patient want to make changes to medical advance directive? No - Patient declined  Copy of [place] in Chart? Yes - validated most recent copy scanned in chart (See row information)  Would patient like information on creating a medical advance directive? -  Pre-existing out of facility DNR order (yellow form or pink MOST form) -     Chief Complaint  Patient presents with  . Medical Management of Chronic Issues    HPI: Patient is a 87 y.o. female seen today for an acute visit for CBG running more then 400 almost whole day  Patient was living in community but was unable to take care of herself and her medicines. And her son decided to move her to AL. Patient walks with a walker. Cognitively intact  Has h/o diabetes mellitus type 2, history of alcoholic liver cirrhosis, Portal hypertensive gastropathys/p EGD Diastolic CHF with Recurent Pleural Effusion and Iron Def Anemia  Her main issus continues to be High CBGS all day long Was seen by Dr Chalmers Cater and is on Lantus BID with Novolog boluses Continues to not watch her diet Did not have any acute complains today No Fever or Dysuria or Cough Weight stable  Past Medical History:  Diagnosis Date  . Anemia   . Anemia of chronic renal failure, stage 3 (moderate) ([place]) 03/24/2020  . Angiodysplasia of ascending colon 10/25/2014  . Anxiety   . Arthritis   . Borderline diabetes   . Cancer of the skin, basal cell 09/03/2012  . Cirrhosis ([place])   . Diabetes mellitus without complication ([place])   . Diverticular disease   . 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 GERD  (gastroesophageal reflux disease)   . Hypertension   . Iron deficiency anemia due to chronic blood loss 01/19/2015  . Portal hypertensive gastropathy ([place]) 08/02/2016   ? Some GAVE also  . Thyroid disease     Past Surgical History:  Procedure Laterality Date  . ABDOMINAL HYSTERECTOMY    . APPENDECTOMY    . COLONOSCOPY    . ESOPHAGOGASTRODUODENOSCOPY    . ESOPHAGOGASTRODUODENOSCOPY (EGD) WITH PROPOFOL N/A 02/22/2020   Procedure: ESOPHAGOGASTRODUODENOSCOPY (EGD) WITH PROPOFOL;  Surgeon: Gatha Mayer, MD;  Location: [facility];  Service: Endoscopy;  Laterality: N/A;  . IR PARACENTESIS  05/25/2016  . IR THORACENTESIS ASP PLEURAL SPACE W/IMG GUIDE  02/23/2020  . LEG SKIN LESION  BIOPSY / EXCISION  12/11/14  . MOHS SURGERY     ankle  . TONSILLECTOMY    . TOTAL HIP ARTHROPLASTY Bilateral 1993, 2006  . UPPER GASTROINTESTINAL ENDOSCOPY  09/09/2017    Allergies  Allergen Reactions  . Tizanidine Other (See Comments)    Hallucinate, confused     Outpatient Encounter Medications as of 06/17/2020  Medication Sig  . Accu-Chek FastClix Lancets MISC CHECK FOR BLOOD SUGAR DAILY  . acetaminophen (TYLENOL) 325 MG tablet Take 650 mg by mouth every 6 (six) hours as needed.  . Ascorbic Acid (VITAMIN C) 1000 MG tablet Take 1,000 mg by mouth at bedtime.  . folic acid (FOLVITE) 1 MG tablet Take 1 tablet (1 mg total) by mouth daily.  Marland Kitchen glucose  blood test strip 1 each by Other route 2 (two) times daily. Use as instructed-Check blood glucose prior to administering insulin  . insulin aspart (NOVOLOG) 100 UNIT/ML injection Inject 5 Units into the skin 3 (three) times daily. Per Sliding scale  . insulin glargine (LANTUS) 100 UNIT/ML injection Inject 50 Units into the skin every morning.  . insulin glargine (LANTUS) 100 UNIT/ML injection Inject 25 Units into the skin at bedtime.  Marland Kitchen lactulose (CHRONULAC) 10 GM/15ML solution Take by mouth daily.  Marland Kitchen latanoprost (XALATAN) 0.005 % ophthalmic solution Place 1 drop  into both eyes at bedtime.   Marland Kitchen levothyroxine (SYNTHROID) 75 MCG tablet Take 1 tablet (75 mcg total) by mouth daily.  Marland Kitchen LORazepam (ATIVAN) 0.5 MG tablet Take 1 tablet (0.5 mg total) by mouth at bedtime.  . metFORMIN (GLUCOPHAGE-XR) 500 MG 24 hr tablet Take 1 tablet (500 mg total) by mouth daily.  . metoprolol succinate (TOPROL-XL) 25 MG 24 hr tablet Take 12.5 mg by mouth daily.  . pantoprazole (PROTONIX) 40 MG tablet Take 1 tablet (40 mg total) by mouth daily.  . potassium chloride SA (KLOR-CON) 20 MEQ tablet Take 20 mEq by mouth daily.  . simvastatin (ZOCOR) 20 MG tablet TAKE ONE TABLET BY MOUTH EVERY NIGHT AT BEDTIME  . spironolactone (ALDACTONE) 50 MG tablet Take 1 tablet (50 mg total) by mouth daily.  Marland Kitchen torsemide (DEMADEX) 20 MG tablet Take 40 mg by mouth daily.  . vitamin E 180 MG (400 UNITS) capsule Take 400 Units by mouth every evening.   No facility-administered encounter medications on file as of 06/17/2020.    Review of Systems:  Review of Systems  Review of Systems  Constitutional: Negative for activity change, appetite change, chills, diaphoresis, fatigue and fever.  HENT: Negative for mouth sores, postnasal drip, rhinorrhea, sinus pain and sore throat.   Respiratory: Negative for apnea, cough, chest tightness, shortness of breath and wheezing.   Cardiovascular: Negative for chest pain, palpitations and leg swelling.  Gastrointestinal: Negative for abdominal distention, abdominal pain, constipation, diarrhea, nausea and vomiting.  Genitourinary: Negative for dysuria and frequency.  Musculoskeletal: Negative for arthralgias, joint swelling and myalgias.  Skin: Negative for rash.  Neurological: Negative for dizziness, syncope, weakness, light-headedness and numbness.  Psychiatric/Behavioral: Negative for behavioral problems, confusion and sleep disturbance.     Health Maintenance  Topic Date Due  . Zoster Vaccines- Shingrix (1 of 2) Never done  . FOOT EXAM  05/12/2015  .  COVID-19 Vaccine (3 - Moderna risk 4-dose series) 05/21/2019  . URINE MICROALBUMIN  04/27/2020  . OPHTHALMOLOGY EXAM  05/17/2020  . HEMOGLOBIN A1C  07/13/2020  . INFLUENZA VACCINE  08/22/2020  . TETANUS/TDAP  11/07/2026  . DEXA SCAN  Completed  . PNA vac Low Risk Adult  Addressed  . HPV VACCINES  Aged Out  . COLONOSCOPY (Pts 45-33yrs Insurance coverage will need to be confirmed)  Discontinued    Physical Exam: Vitals:   06/17/20 1507  BP: 136/72  Pulse: 66  Resp: 18  Temp: 98.6 F (37 C)  SpO2: 95%  Weight: 178 lb 6.4 oz (80.9 kg)  Height: 5\' 7"  (1.702 m)   Body mass index is 27.94 kg/m. Physical Exam Constitutional: Oriented to person, place, and time. Well-developed and well-nourished.  HENT:  Head: Normocephalic.  Mouth/Throat: Oropharynx is clear and moist.  Eyes: Pupils are equal, round, and reactive to light.  Neck: Neck supple.  Cardiovascular: Normal rate and normal heart sounds.  No murmur heard. Pulmonary/Chest: Effort normal and  breath sounds normal. No respiratory distress. No wheezes. She has no rales.  Abdominal: Soft. Bowel sounds are normal. No distension. There is no tenderness. There is no rebound.  Musculoskeletal: Mild Edema Bilateral  Lymphadenopathy: none Neurological: Alert and oriented to person, place, and time.  Skin: Skin is warm and dry.  Psychiatric: Normal mood and affect. Behavior is normal. Thought content normal.   Labs reviewed: Basic Metabolic Panel: Recent Labs    11/09/19 1137 11/14/19 1136 12/14/19 1113 01/12/20 1241 01/13/20 0610 01/14/20 0618 03/01/20 1336 03/01/20 1336 03/17/20 0000 03/24/20 1452 04/19/20 0000 05/18/20 0745 06/03/20 0000  NA 136   < > 137   < > 141   < > 134  --  139 135 138 135 137  K 3.6   < > 3.6   < > 3.4*   < > 4.4  --  3.7 4.4 3.9 4.0 4.2  CL 100   < > 99   < > 105   < > 96  --  103 98 104 98 106  CO2 25   < > 24   < > 23   < > 23  --  28* 31 28* 27 24*  GLUCOSE 357*   < > 332*   < > 181*    < > 385*  --   --  242*  --  213*  --   BUN 14   < > 13   < > 22   < > 19  --  26* 24* 19 23 19   CREATININE 1.02*   < > 1.04*   < > 1.11*   < > 1.18*   < > 1.1 1.31* 1.0 1.25* 0.9  CALCIUM 9.2   < > 8.8   < > 9.2   < > 9.4  --  9.1 10.2 8.9 9.5 9.4  MG  --   --  1.5*  --  2.2  --  1.6  --   --   --   --   --   --   TSH 2.65  --   --   --   --   --   --   --  2.27  --   --   --   --    < > = values in this interval not displayed.   Liver Function Tests: Recent Labs    02/01/20 1446 03/17/20 0000 03/24/20 1452 05/18/20 0745 06/03/20 0000  AST 40*   < > 26 24 24   ALT 21   < > 16 16 16   ALKPHOS 95   < > 75 87 85  BILITOT 1.8*  --  1.1 1.7*  --   PROT 6.1  --  5.9* 6.2*  --   ALBUMIN 3.6   < > 3.6 3.4* 3.2*   < > = values in this interval not displayed.   No results for input(s): LIPASE, AMYLASE in the last 8760 hours. Recent Labs    01/13/20 0610 01/16/20 1213 01/17/20 1048  AMMONIA 37* 71* 53*   CBC: Recent Labs    02/22/20 0930 03/17/20 0000 03/24/20 1452 05/18/20 0745  WBC 3.3* 2.6 5.5 5.3  NEUTROABS  --  1,604.00 3.9 3.8  HGB 8.5* 8.8* 9.3* 8.8*  HCT 26.6* 28* 29.7* 28.8*  MCV 90.5  --  88.9 79.6*  PLT 58* 55* 77* 68*   Lipid Panel: Recent Labs    11/09/19 1137  CHOL 112  HDL 46*  LDLCALC 52  TRIG 61  CHOLHDL 2.4   Lab Results  Component Value Date   HGBA1C 7.6 (H) 01/13/2020    Procedures since last visit: No results found.  Assessment/Plan  Type 2 Diabetes uncontrolled   Change Lantus to 30 units in HS Lantus 55 units in AM Change Novolog to 10 units TID with meals Start on Januvia 50 mg QD Sees Dr Chalmers Cater also   Acute on chronic diastolic CHF (congestive heart failure) (HCC) On Demadex now Creat staying Stable   Tachycardia Continue on Low dose of Lopressor   Hypothyroidism TSH normal in 2/22  Anemia due to Variceal bleed  and Pancytopenia Iron Infusion by Hematology Has Splenomegaly   Alcoholic cirrhosis of liver with  ascites ([place]) Follows with GI On Lactulose and Aldactone  . Pleural effusion on right Due to her Cirrhosis Continue to monitor On Diuretics  Anxiety On Ativan Labs/tests ordered:  * No order type specified * Next appt:  Visit date not found

## 2020-06-17 NOTE — Progress Notes (Deleted)
Location:   [place] [facility] Room Number: [place]:  [id number] [phone]) Provider:  Veleta Miners MD  Virgie Dad, MD  Patient Care Team: Virgie Dad, MD as PCP - General (Internal Medicine) Berniece Salines, DO as PCP - Cardiology (Cardiology) Sheryn Bison, MD as Referring Physician (Dermatology) Gatha Mayer, MD as Consulting Physician (Gastroenterology) Marin Olp Rudell Cobb, MD as Consulting Physician (Oncology) Janan Ridge, MD as Consulting Physician (Dermatology) Paralee Cancel, MD as Consulting Physician (Orthopedic Surgery) Jacelyn Pi, MD as Consulting Physician (Endocrinology) Day, Melvenia Beam, [facility] (Inactive) as Pharmacist (Pharmacist) Berniece Salines, DO as Consulting Physician (Cardiology)  Extended Emergency Contact Information Primary Emergency Contact: Trinidad Curet [place] Phone: [phone] Mobile Phone: [phone] Relation: Son Secondary Emergency Contact: Randi, [place] Mobile Phone: [phone] Relation: Relative Preferred language: English Interpreter needed? No  Code Status:  DNR Goals of care: Advanced Directive information Advanced Directives 06/17/2020  Does Patient Have a Medical Advance Directive? Yes  Type of Paramedic of [place];Living will;Out of facility DNR (pink MOST or yellow form)  Does patient want to make changes to medical advance directive? No - Patient declined  Copy of [place] in Chart? Yes - validated most recent copy scanned in chart (See row information)  Would patient like information on creating a medical advance directive? -  Pre-existing out of facility DNR order (yellow form or pink MOST form) -     Chief Complaint  Patient presents with  . Medical Management of Chronic Issues    HPI:  Pt is a 87 y.o. female seen today for an acute visit for    Past Medical History:  Diagnosis Date  . Anemia   . Anemia of chronic renal  failure, stage 3 (moderate) ([place]) 03/24/2020  . Angiodysplasia of ascending colon 10/25/2014  . Anxiety   . Arthritis   . Borderline diabetes   . Cancer of the skin, basal cell 09/03/2012  . Cirrhosis ([place])   . Diabetes mellitus without complication ([place])   . Diverticular disease   . 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 GERD (gastroesophageal reflux disease)   . Hypertension   . Iron deficiency anemia due to chronic blood loss 01/19/2015  . Portal hypertensive gastropathy ([place]) 08/02/2016   ? Some GAVE also  . Thyroid disease    Past Surgical History:  Procedure Laterality Date  . ABDOMINAL HYSTERECTOMY    . APPENDECTOMY    . COLONOSCOPY    . ESOPHAGOGASTRODUODENOSCOPY    . ESOPHAGOGASTRODUODENOSCOPY (EGD) WITH PROPOFOL N/A 02/22/2020   Procedure: ESOPHAGOGASTRODUODENOSCOPY (EGD) WITH PROPOFOL;  Surgeon: Gatha Mayer, MD;  [facility];  Service: Endoscopy;  Laterality: N/A;  . IR PARACENTESIS  05/25/2016  . IR THORACENTESIS ASP PLEURAL SPACE W/IMG GUIDE  02/23/2020  . LEG SKIN LESION  BIOPSY / EXCISION  12/11/14  . MOHS SURGERY     ankle  . TONSILLECTOMY    . TOTAL HIP ARTHROPLASTY Bilateral 1993, 2006  . UPPER GASTROINTESTINAL ENDOSCOPY  09/09/2017    Allergies  Allergen Reactions  . Tizanidine Other (See Comments)    Hallucinate, confused     Allergies as of 06/17/2020      Reactions   Tizanidine Other (See Comments)   Hallucinate, confused       Medication List       Accurate as of Jun 17, 2020  3:28 PM. If you have any questions, ask your nurse or doctor.  Accu-Chek FastClix Lancets Misc CHECK FOR BLOOD SUGAR DAILY   acetaminophen 325 MG tablet Commonly known as: TYLENOL Take 650 mg by mouth every 6 (six) hours as needed.   folic acid 1 MG tablet Commonly known as: FOLVITE Take 1 tablet (1 mg total) by mouth daily.   glucose blood test strip 1 each by Other route 2 (two) times daily. Use as instructed-Check blood glucose  prior to administering insulin   insulin aspart 100 UNIT/ML injection Commonly known as: novoLOG Inject 5 Units into the skin 3 (three) times daily. Per Sliding scale   insulin glargine 100 UNIT/ML injection Commonly known as: LANTUS Inject 50 Units into the skin every morning.   insulin glargine 100 UNIT/ML injection Commonly known as: LANTUS Inject 25 Units into the skin at bedtime.   lactulose 10 GM/15ML solution Commonly known as: CHRONULAC Take by mouth daily.   latanoprost 0.005 % ophthalmic solution Commonly known as: XALATAN Place 1 drop into both eyes at bedtime.   levothyroxine 75 MCG tablet Commonly known as: SYNTHROID Take 1 tablet (75 mcg total) by mouth daily.   LORazepam 0.5 MG tablet Commonly known as: ATIVAN Take 1 tablet (0.5 mg total) by mouth at bedtime.   metFORMIN 500 MG 24 hr tablet Commonly known as: GLUCOPHAGE-XR Take 1 tablet (500 mg total) by mouth daily.   metoprolol succinate 25 MG 24 hr tablet Commonly known as: TOPROL-XL Take 12.5 mg by mouth daily.   pantoprazole 40 MG tablet Commonly known as: PROTONIX Take 1 tablet (40 mg total) by mouth daily.   potassium chloride SA 20 MEQ tablet Commonly known as: KLOR-CON Take 20 mEq by mouth daily.   simvastatin 20 MG tablet Commonly known as: ZOCOR TAKE ONE TABLET BY MOUTH EVERY NIGHT AT BEDTIME   spironolactone 50 MG tablet Commonly known as: Aldactone Take 1 tablet (50 mg total) by mouth daily.   torsemide 20 MG tablet Commonly known as: DEMADEX Take 40 mg by mouth daily.   vitamin C 1000 MG tablet Take 1,000 mg by mouth at bedtime.   vitamin E 180 MG (400 UNITS) capsule Take 400 Units by mouth every evening.       Review of Systems  Immunization History  Administered Date(s) Administered  . Fluad Quad(high Dose 65+) 10/27/2018, 02/09/2020  . Hepatitis B, adult 05/12/2014  . Hepatitis B, ped/adol 08/10/2014  . Influenza Split 11/02/2009, 10/15/2010  . Influenza, High  Dose Seasonal PF 10/13/2014, 01/26/2017  . Influenza, Quadrivalent, Recombinant, Inj, Pf 09/30/2017  . Influenza, Seasonal, Injecte, Preservative Fre 10/13/2014  . Influenza,inj,quad, With Preservative 11/22/2016  . Influenza-Unspecified 11/25/2013, 10/06/2015, 10/22/2017  . Moderna Sars-Covid-2 Vaccination 03/26/2019, 04/23/2019  . Pneumococcal Conjugate-13 05/17/2014  . Pneumococcal Polysaccharide-23 10/06/2015  . Tdap 10/02/2011, 11/06/2016  . Zoster, Live 01/22/2005   Pertinent  Health Maintenance Due  Topic Date Due  . FOOT EXAM  05/12/2015  . URINE MICROALBUMIN  04/27/2020  . OPHTHALMOLOGY EXAM  05/17/2020  . HEMOGLOBIN A1C  07/13/2020  . INFLUENZA VACCINE  08/22/2020  . DEXA SCAN  Completed  . PNA vac Low Risk Adult  Addressed  . COLONOSCOPY (Pts 45-47yrs Insurance coverage will need to be confirmed)  Discontinued   Fall Risk  04/28/2019 01/09/2018 10/26/2016 10/09/2016 11/25/2015  Falls in the past year? 1 0 Yes No Yes  Number falls in past yr: 0 - 2 or more - 2 or more  Injury with Fall? 0 - No - Yes  Comment - - - - -  Risk Factor Category  - - High Fall Risk - High Fall Risk  Risk for fall due to : - - Impaired balance/gait - -  Follow up Falls evaluation completed - - - Falls prevention discussed   Functional Status Survey:    Vitals:   06/17/20 1507  BP: 136/72  Pulse: 66  Resp: 18  Temp: 98.6 F (37 C)  SpO2: 95%  Weight: 178 lb 6.4 oz (80.9 kg)  Height: 5\' 7"  (1.702 m)   Body mass index is 27.94 kg/m. Physical Exam  Labs reviewed: Recent Labs    12/14/19 1113 01/12/20 1241 01/13/20 0610 01/14/20 0618 03/01/20 1336 03/17/20 0000 03/24/20 1452 04/19/20 0000 05/18/20 0745 06/03/20 0000  NA 137   < > 141   < > 134   < > 135 138 135 137  K 3.6   < > 3.4*   < > 4.4   < > 4.4 3.9 4.0 4.2  CL 99   < > 105   < > 96   < > 98 104 98 106  CO2 24   < > 23   < > 23   < > 31 28* 27 24*  GLUCOSE 332*   < > 181*   < > 385*  --  242*  --  213*  --   BUN 13    < > 22   < > 19   < > 24* 19 23 19   CREATININE 1.04*   < > 1.11*   < > 1.18*   < > 1.31* 1.0 1.25* 0.9  CALCIUM 8.8   < > 9.2   < > 9.4   < > 10.2 8.9 9.5 9.4  MG 1.5*  --  2.2  --  1.6  --   --   --   --   --    < > = values in this interval not displayed.   Recent Labs    02/01/20 1446 03/17/20 0000 03/24/20 1452 05/18/20 0745 06/03/20 0000  AST 40*   < > 26 24 24   ALT 21   < > 16 16 16   ALKPHOS 95   < > 75 87 85  BILITOT 1.8*  --  1.1 1.7*  --   PROT 6.1  --  5.9* 6.2*  --   ALBUMIN 3.6   < > 3.6 3.4* 3.2*   < > = values in this interval not displayed.   Recent Labs    02/22/20 0930 03/17/20 0000 03/24/20 1452 05/18/20 0745  WBC 3.3* 2.6 5.5 5.3  NEUTROABS  --  1,604.00 3.9 3.8  HGB 8.5* 8.8* 9.3* 8.8*  HCT 26.6* 28* 29.7* 28.8*  MCV 90.5  --  88.9 79.6*  PLT 58* 55* 77* 68*   Lab Results  Component Value Date   TSH 2.27 03/17/2020   Lab Results  Component Value Date   HGBA1C 7.6 (H) 01/13/2020   Lab Results  Component Value Date   CHOL 112 11/09/2019   HDL 46 (L) 11/09/2019   LDLCALC 52 11/09/2019   TRIG 61 11/09/2019   CHOLHDL 2.4 11/09/2019    Significant Diagnostic Results in last 30 days:  No results found.  Assessment/Plan There are no diagnoses linked to this encounter.   Family/ staff Communication:   Labs/tests ordered:

## 2020-06-19 ENCOUNTER — Encounter: Payer: Self-pay | Admitting: Internal Medicine

## 2020-06-22 ENCOUNTER — Other Ambulatory Visit: Payer: Self-pay

## 2020-06-22 ENCOUNTER — Inpatient Hospital Stay: Payer: HMO

## 2020-06-22 ENCOUNTER — Inpatient Hospital Stay (HOSPITAL_BASED_OUTPATIENT_CLINIC_OR_DEPARTMENT_OTHER): Payer: HMO | Admitting: Family

## 2020-06-22 ENCOUNTER — Inpatient Hospital Stay: Payer: HMO | Attending: Hematology & Oncology

## 2020-06-22 ENCOUNTER — Encounter: Payer: Self-pay | Admitting: Family

## 2020-06-22 VITALS — BP 92/61 | HR 122 | Temp 98.3°F | Resp 21 | Wt 181.1 lb

## 2020-06-22 DIAGNOSIS — D6959 Other secondary thrombocytopenia: Secondary | ICD-10-CM | POA: Diagnosis not present

## 2020-06-22 DIAGNOSIS — Z79899 Other long term (current) drug therapy: Secondary | ICD-10-CM | POA: Insufficient documentation

## 2020-06-22 DIAGNOSIS — D631 Anemia in chronic kidney disease: Secondary | ICD-10-CM

## 2020-06-22 DIAGNOSIS — Z794 Long term (current) use of insulin: Secondary | ICD-10-CM | POA: Insufficient documentation

## 2020-06-22 DIAGNOSIS — R195 Other fecal abnormalities: Secondary | ICD-10-CM | POA: Diagnosis not present

## 2020-06-22 DIAGNOSIS — K7460 Unspecified cirrhosis of liver: Secondary | ICD-10-CM | POA: Diagnosis not present

## 2020-06-22 DIAGNOSIS — D649 Anemia, unspecified: Secondary | ICD-10-CM

## 2020-06-22 DIAGNOSIS — N1832 Chronic kidney disease, stage 3b: Secondary | ICD-10-CM | POA: Diagnosis not present

## 2020-06-22 DIAGNOSIS — R161 Splenomegaly, not elsewhere classified: Secondary | ICD-10-CM | POA: Diagnosis not present

## 2020-06-22 DIAGNOSIS — K922 Gastrointestinal hemorrhage, unspecified: Secondary | ICD-10-CM | POA: Diagnosis not present

## 2020-06-22 DIAGNOSIS — D500 Iron deficiency anemia secondary to blood loss (chronic): Secondary | ICD-10-CM | POA: Diagnosis not present

## 2020-06-22 LAB — RETICULOCYTES
Immature Retic Fract: 16.9 % — ABNORMAL HIGH (ref 2.3–15.9)
RBC.: 4.11 MIL/uL (ref 3.87–5.11)
Retic Count, Absolute: 120 10*3/uL (ref 19.0–186.0)
Retic Ct Pct: 2.9 % (ref 0.4–3.1)

## 2020-06-22 LAB — CMP (CANCER CENTER ONLY)
ALT: 18 U/L (ref 0–44)
AST: 28 U/L (ref 15–41)
Albumin: 3.7 g/dL (ref 3.5–5.0)
Alkaline Phosphatase: 101 U/L (ref 38–126)
Anion gap: 8 (ref 5–15)
BUN: 22 mg/dL (ref 8–23)
CO2: 32 mmol/L (ref 22–32)
Calcium: 10.5 mg/dL — ABNORMAL HIGH (ref 8.9–10.3)
Chloride: 98 mmol/L (ref 98–111)
Creatinine: 1.31 mg/dL — ABNORMAL HIGH (ref 0.44–1.00)
GFR, Estimated: 40 mL/min — ABNORMAL LOW (ref >60)
Glucose, Bld: 116 mg/dL — ABNORMAL HIGH (ref 70–99)
Potassium: 3.9 mmol/L (ref 3.5–5.1)
Sodium: 138 mmol/L (ref 135–145)
Total Bilirubin: 2 mg/dL — ABNORMAL HIGH (ref 0.3–1.2)
Total Protein: 6.1 g/dL — ABNORMAL LOW (ref 6.5–8.1)

## 2020-06-22 LAB — CBC WITH DIFFERENTIAL (CANCER CENTER ONLY)
Abs Immature Granulocytes: 0.06 10*3/uL (ref 0.00–0.07)
Basophils Absolute: 0 10*3/uL (ref 0.0–0.1)
Basophils Relative: 1 %
Eosinophils Absolute: 0.1 10*3/uL (ref 0.0–0.5)
Eosinophils Relative: 2 %
HCT: 37.4 % (ref 36.0–46.0)
Hemoglobin: 12.2 g/dL (ref 12.0–15.0)
Immature Granulocytes: 1 %
Lymphocytes Relative: 13 %
Lymphs Abs: 0.8 10*3/uL (ref 0.7–4.0)
MCH: 29.4 pg (ref 26.0–34.0)
MCHC: 32.6 g/dL (ref 30.0–36.0)
MCV: 90.1 fL (ref 80.0–100.0)
Monocytes Absolute: 0.7 10*3/uL (ref 0.1–1.0)
Monocytes Relative: 10 %
Neutro Abs: 4.7 10*3/uL (ref 1.7–7.7)
Neutrophils Relative %: 73 %
Platelet Count: 61 10*3/uL — ABNORMAL LOW (ref 150–400)
RBC: 4.15 MIL/uL (ref 3.87–5.11)
RDW: 23.1 % — ABNORMAL HIGH (ref 11.5–15.5)
WBC Count: 6.4 10*3/uL (ref 4.0–10.5)
nRBC: 0 % (ref 0.0–0.2)

## 2020-06-22 LAB — SAMPLE TO BLOOD BANK

## 2020-06-22 LAB — IRON AND TIBC
Iron: 75 ug/dL (ref 41–142)
Saturation Ratios: 26 % (ref 21–57)
TIBC: 286 ug/dL (ref 236–444)
UIBC: 211 ug/dL (ref 120–384)

## 2020-06-22 LAB — FERRITIN: Ferritin: 48 ng/mL (ref 11–307)

## 2020-06-22 NOTE — Progress Notes (Signed)
Hematology and Oncology Follow Up Visit  Steph Cheadle [id number] 1932-03-18 87 y.o. 06/22/2020   Principle Diagnosis:  Chronic thrombocytopenia secondary to cirrhosis and splenomegaly Iron deficiency anemia secondary to GI bleed  Current Therapy: IV iron as indicated Red blood cell transfusion as needed for variceal bleeding   Interim History:  Ms. Forrey is here today for follow-up. She is doing fairly well and denies fatigue at this time. Hgb after blood and iron is back up to 12.2, MCV 90, WBC count 6.4 and platelets 61.  She denies noting any blood loss. She does bruise easily but not in excess. No petechiae.  She states that she is having trouble remembering things. She was oriented to person, time and place but could not remember who she lives with. No fever, chills, n/v, cough, rash, dizziness, SOB, chest pain, palpitations, abdominal pain or changes in bowel or bladder habits.  She has some mild swelling in ankles and feet. Pedal pulses are 2+.  No new falls or syncope to report.  She is ambulating with her rolling walker for added support.  She states that she is eating well but that she needs to hydrate better throughout the day.  She denies any recent ETOH intake.   ECOG Performance Status: 2 - Symptomatic, <50% confined to bed  Medications:  Allergies as of 06/22/2020      Reactions   Tizanidine Other (See Comments)   Hallucinate, confused       Medication List       Accurate as of June 22, 2020 10:03 AM. If you have any questions, ask your nurse or doctor.        Accu-Chek FastClix Lancets Misc CHECK FOR BLOOD SUGAR DAILY   acetaminophen 325 MG tablet Commonly known as: TYLENOL Take 650 mg by mouth every 6 (six) hours as needed.   folic acid 1 MG tablet Commonly known as: FOLVITE Take 1 tablet (1 mg total) by mouth daily.   glucose blood test strip 1 each by Other route 2 (two) times daily. Use as instructed-Check blood glucose prior to  administering insulin   insulin aspart 100 UNIT/ML injection Commonly known as: novoLOG Inject 10 Units into the skin 3 (three) times daily. CBG more then 100   insulin glargine 100 UNIT/ML injection Commonly known as: LANTUS Inject 55 Units into the skin every morning.   insulin glargine 100 UNIT/ML injection Commonly known as: LANTUS Inject 30 Units into the skin at bedtime.   lactulose 10 GM/15ML solution Commonly known as: CHRONULAC Take by mouth daily.   latanoprost 0.005 % ophthalmic solution Commonly known as: XALATAN Place 1 drop into both eyes at bedtime.   levothyroxine 75 MCG tablet Commonly known as: SYNTHROID Take 1 tablet (75 mcg total) by mouth daily.   LORazepam 0.5 MG tablet Commonly known as: ATIVAN Take 1 tablet (0.5 mg total) by mouth at bedtime.   metFORMIN 500 MG 24 hr tablet Commonly known as: GLUCOPHAGE-XR Take 1 tablet (500 mg total) by mouth daily.   metoprolol succinate 25 MG 24 hr tablet Commonly known as: TOPROL-XL Take 12.5 mg by mouth daily.   pantoprazole 40 MG tablet Commonly known as: PROTONIX Take 1 tablet (40 mg total) by mouth daily.   potassium chloride SA 20 MEQ tablet Commonly known as: KLOR-CON Take 20 mEq by mouth daily.   simvastatin 20 MG tablet Commonly known as: ZOCOR TAKE ONE TABLET BY MOUTH EVERY NIGHT AT BEDTIME   sitaGLIPtin 50 MG tablet Commonly known as:  JANUVIA Take 50 mg by mouth daily.   spironolactone 50 MG tablet Commonly known as: Aldactone Take 1 tablet (50 mg total) by mouth daily.   torsemide 20 MG tablet Commonly known as: DEMADEX Take 40 mg by mouth daily.   vitamin C 1000 MG tablet Take 1,000 mg by mouth at bedtime.   vitamin E 180 MG (400 UNITS) capsule Take 400 Units by mouth every evening.       Allergies:  Allergies  Allergen Reactions  . Tizanidine Other (See Comments)    Hallucinate, confused     Past Medical History, Surgical history, Social history, and Family History  were reviewed and updated.  Review of Systems: All other 10 point review of systems is negative.   Physical Exam:  weight is 181 lb 1.3 oz (82.1 kg). Her oral temperature is 98.3 F (36.8 C). Her blood pressure is 92/61 and her pulse is 122 (abnormal). Her respiration is 21 (abnormal) and oxygen saturation is 97%.   Wt Readings from Last 3 Encounters:  06/22/20 181 lb 1.3 oz (82.1 kg)  06/17/20 178 lb 6.4 oz (80.9 kg)  05/18/20 180 lb 6.4 oz (81.8 kg)    Ocular: Sclerae unicteric, pupils equal, round and reactive to light Ear-nose-throat: Oropharynx clear, dentition fair Lymphatic: No cervical or supraclavicular adenopathy Lungs no rales or rhonchi, good excursion bilaterally Heart regular rate and rhythm, no murmur appreciated Abd soft, nontender, positive bowel sounds MSK no focal spinal tenderness, no joint edema Neuro: non-focal, well-oriented, appropriate affect Breasts: Deferred   Lab Results  Component Value Date   WBC 6.4 06/22/2020   HGB 12.2 06/22/2020   HCT 37.4 06/22/2020   MCV 90.1 06/22/2020   PLT 61 (L) 06/22/2020   Lab Results  Component Value Date   FERRITIN 16 05/18/2020   IRON 34 (L) 05/18/2020   TIBC 326 05/18/2020   UIBC 292 05/18/2020   IRONPCTSAT 10 (L) 05/18/2020   Lab Results  Component Value Date   RETICCTPCT 2.9 06/22/2020   RBC 4.11 06/22/2020   RBC 4.15 06/22/2020   No results found for: KPAFRELGTCHN, LAMBDASER, KAPLAMBRATIO No results found for: IGGSERUM, IGA, IGMSERUM No results found for: Kathrynn Ducking, MSPIKE, SPEI   Chemistry      Component Value Date/Time   NA 137 06/03/2020 0000   NA 138 12/18/2016 1314   NA 139 03/05/2016 1139   K 4.2 06/03/2020 0000   K 4.3 12/18/2016 1314   K 3.6 03/05/2016 1139   CL 106 06/03/2020 0000   CL 101 12/18/2016 1314   CO2 24 (A) 06/03/2020 0000   CO2 28 12/18/2016 1314   CO2 26 03/05/2016 1139   BUN 19 06/03/2020 0000   BUN 33 (H) 12/18/2016  1314   BUN 14.3 03/05/2016 1139   CREATININE 0.9 06/03/2020 0000   CREATININE 1.25 (H) 05/18/2020 0745   CREATININE 1.02 (H) 11/09/2019 1137   CREATININE 0.7 03/05/2016 1139   GLU 243 06/03/2020 0000      Component Value Date/Time   CALCIUM 9.4 06/03/2020 0000   CALCIUM 10.0 12/18/2016 1314   CALCIUM 9.3 03/05/2016 1139   ALKPHOS 85 06/03/2020 0000   ALKPHOS 94 (H) 12/18/2016 1314   ALKPHOS 127 03/05/2016 1139   AST 24 06/03/2020 0000   AST 24 05/18/2020 0745   AST 29 03/05/2016 1139   ALT 16 06/03/2020 0000   ALT 16 05/18/2020 0745   ALT 26 12/18/2016 1314   ALT 22 03/05/2016 1139  BILITOT 1.7 (H) 05/18/2020 0745   BILITOT 1.75 (H) 03/05/2016 1139       Impression and Plan: Ms. Angello is a very pleasant89 yo female with chronic thrombocytopenia and iron deficiency anemia secondary to intermittent GI blood loss. Hgb is stable at 12.2, no transfusion needed at this time.  Iron studies are pending. We will replace if needed.  Follow-up in 6 weeks.  Both she and her family know they can contact our office with any questions or concerns.   [name], NP 6/1/202210:03 AM

## 2020-06-24 ENCOUNTER — Telehealth: Payer: Self-pay | Admitting: *Deleted

## 2020-06-24 LAB — ERYTHROPOIETIN: Erythropoietin: 35.9 m[IU]/mL — ABNORMAL HIGH (ref 2.6–18.5)

## 2020-06-24 NOTE — Telephone Encounter (Signed)
Per 06/22/20 los - called and lvm of upcoming appointments - mailed calendar 

## 2020-07-01 ENCOUNTER — Non-Acute Institutional Stay: Payer: HMO | Admitting: Nurse Practitioner

## 2020-07-01 ENCOUNTER — Encounter: Payer: Self-pay | Admitting: Nurse Practitioner

## 2020-07-01 DIAGNOSIS — N1832 Chronic kidney disease, stage 3b: Secondary | ICD-10-CM | POA: Diagnosis not present

## 2020-07-01 DIAGNOSIS — I10 Essential (primary) hypertension: Secondary | ICD-10-CM | POA: Diagnosis not present

## 2020-07-01 DIAGNOSIS — I482 Chronic atrial fibrillation, unspecified: Secondary | ICD-10-CM

## 2020-07-01 DIAGNOSIS — I5032 Chronic diastolic (congestive) heart failure: Secondary | ICD-10-CM | POA: Diagnosis not present

## 2020-07-01 DIAGNOSIS — Z794 Long term (current) use of insulin: Secondary | ICD-10-CM

## 2020-07-01 DIAGNOSIS — K219 Gastro-esophageal reflux disease without esophagitis: Secondary | ICD-10-CM

## 2020-07-01 DIAGNOSIS — J90 Pleural effusion, not elsewhere classified: Secondary | ICD-10-CM

## 2020-07-01 DIAGNOSIS — D631 Anemia in chronic kidney disease: Secondary | ICD-10-CM

## 2020-07-01 DIAGNOSIS — E1165 Type 2 diabetes mellitus with hyperglycemia: Secondary | ICD-10-CM

## 2020-07-01 DIAGNOSIS — F419 Anxiety disorder, unspecified: Secondary | ICD-10-CM

## 2020-07-01 DIAGNOSIS — E039 Hypothyroidism, unspecified: Secondary | ICD-10-CM

## 2020-07-01 DIAGNOSIS — K7031 Alcoholic cirrhosis of liver with ascites: Secondary | ICD-10-CM

## 2020-07-01 NOTE — Assessment & Plan Note (Signed)
takes Pantoprazole, f/u GI, gastric antral vascular ectasia.

## 2020-07-01 NOTE — Assessment & Plan Note (Signed)
chronic insulin dependent, taks  Lantus,  Novolog with meals  f/u endocrinology. Takes Metformin, Januvia. Obtain Hgb a1c.

## 2020-07-01 NOTE — Assessment & Plan Note (Signed)
CKD Na 138, K 3.9, Bun 22, creat 1.31, eGFR 40 06/22/20

## 2020-07-01 NOTE — Assessment & Plan Note (Signed)
takes Folic acid. Gastric antral vascular ectasia. Iron infusion, f/u oncology,  Hgb 12.2, Iron 75 06/22/20

## 2020-07-01 NOTE — Assessment & Plan Note (Signed)
Heart rate is in control, symptomatic, f/u Cardiology. Takes Metoprolol.

## 2020-07-01 NOTE — Progress Notes (Signed)
Location:   [place] Room Number: [place] of Service:  ALF [phone]) Provider:  Luisdaniel Kenton Otho Darner, NP    Patient Care Team: Virgie Dad, MD as PCP - General (Internal Medicine) Berniece Salines, DO as PCP - Cardiology (Cardiology) Sheryn Bison, MD as Referring Physician (Dermatology) Gatha Mayer, MD as Consulting Physician (Gastroenterology) Marin Olp Rudell Cobb, MD as Consulting Physician (Oncology) Janan Ridge, MD as Consulting Physician (Dermatology) Paralee Cancel, MD as Consulting Physician (Orthopedic Surgery) Jacelyn Pi, MD as Consulting Physician (Endocrinology) Day, Melvenia Beam, [facility] (Inactive) as Pharmacist (Pharmacist) Berniece Salines, DO as Consulting Physician (Cardiology)  Extended Emergency Contact Information Primary Emergency Contact: Trinidad Curet [place] Phone: [phone] Mobile Phone: [phone] Relation: Son Secondary Emergency Contact: Takeesha, Isley Mobile Phone: [phone] Relation: Relative Preferred language: English Interpreter needed? No  Code Status:  DNR Goals of care: Advanced Directive information Advanced Directives 07/01/2020  Does Patient Have a Medical Advance Directive? -  Type of Paramedic of [place];Living will;Out of facility DNR (pink MOST or yellow form)  Does patient want to make changes to medical advance directive? -  Copy of [place] in Chart? Yes - validated most recent copy scanned in chart (See row information)  Would patient like information on creating a medical advance directive? -  Pre-existing out of facility DNR order (yellow form or pink MOST form) Yellow form placed in chart (order not valid for inpatient use)     Chief Complaint  Patient presents with   Acute Visit    Patient presents for edema in legs and following up for rash under breast.     HPI:  Pt is a 87 y.o. female seen today for an acute visit for ras under  breasts and swelling in legs.     Type 2 diabetes, chronic insulin dependent             Lantus,  Novolog with meals  f/u endocrinology. Takes Metformin, Januvia             CHF/Edema chronic, Takes Spironolactone,  Torsemide since 04/28/20.  EF 60-65%. Bun/creat 22/1.31 06/22/20             CKD Na 138, K 3.9, Bun 22, creat 1.31, eGFR 40 06/22/20             HTN, takes Metoprolol             Afib, asymptomatic, f/u Cardiology. Takes Metoprolol.              GERD, takes Pantoprazole, f/u GI, gastric antral vascular ectasia.             Hypothyroidism, takes Levothyroxine 23mg qd, TSH 2.27 03/17/20             Anxiety, take Lorazepam 0.575mqhs             Pleural effusion, last seen by pulmonology 03/01/20. Last hospitalization 12/2019, underwent thoracentesis 01/13/21 and 2 litter fluid removed, negative culture, probably a transudate from liver cirrhosis             Anemia, takes Folic acid. Gastric antral vascular ectasia. Splenomegaly.  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 [place] 04/29/20 no abnormal fluid in the peritoneum, a small amount of debirs on the gallbladder floor, no shadowing stones,  large splenomegaly               Past Medical History:  Diagnosis Date   Anemia    Anemia of chronic renal failure, stage 3 (moderate) ([place]) 03/24/2020   Angiodysplasia of ascending colon 10/25/2014   Anxiety    Arthritis    Borderline diabetes    Cancer of the skin, basal cell 09/03/2012   Cirrhosis ([place])    Diabetes mellitus without complication ([place])    Diverticular disease    GAVE (gastric antral vascular ectasia) 09/09/2017   GERD (gastroesophageal reflux disease)    Hypertension    Iron deficiency anemia due to chronic blood loss 01/19/2015   Portal hypertensive gastropathy ([place]) 08/02/2016   ? Some GAVE also   Thyroid disease    Past Surgical History:  Procedure Laterality  Date   ABDOMINAL HYSTERECTOMY     APPENDECTOMY     COLONOSCOPY     ESOPHAGOGASTRODUODENOSCOPY     ESOPHAGOGASTRODUODENOSCOPY (EGD) WITH PROPOFOL N/A 02/22/2020   Procedure: ESOPHAGOGASTRODUODENOSCOPY (EGD) WITH PROPOFOL;  Surgeon: Gatha Mayer, MD;  Location: [facility];  Service: Endoscopy;  Laterality: N/A;   IR PARACENTESIS  05/25/2016   IR THORACENTESIS ASP PLEURAL SPACE W/IMG GUIDE  02/23/2020   LEG SKIN LESION  BIOPSY / EXCISION  12/11/14   MOHS SURGERY     ankle   TONSILLECTOMY     TOTAL HIP ARTHROPLASTY Bilateral 1993, 2006   UPPER GASTROINTESTINAL ENDOSCOPY  09/09/2017    Allergies  Allergen Reactions   Tizanidine Other (See Comments)    Hallucinate, confused     Allergies as of 07/01/2020       Reactions   Tizanidine Other (See Comments)   Hallucinate, confused         Medication List        Accurate as of July 01, 2020 12:51 PM. If you have any questions, ask your nurse or doctor.          Accu-Chek FastClix Lancets Misc CHECK FOR BLOOD SUGAR DAILY   acetaminophen 325 MG tablet Commonly known as: TYLENOL Take 650 mg by mouth every 6 (six) hours as needed.   folic acid 1 MG tablet Commonly known as: FOLVITE Take 1 tablet (1 mg total) by mouth daily.   glucose blood test strip 1 each by Other route 2 (two) times daily. Use as instructed-Check blood glucose prior to administering insulin   insulin aspart 100 UNIT/ML injection Commonly known as: novoLOG Inject 10 Units into the skin 3 (three) times daily. CBG more then 100   insulin glargine 100 UNIT/ML injection Commonly known as: LANTUS Inject 55 Units into the skin every morning.   insulin glargine 100 UNIT/ML injection Commonly known as: LANTUS Inject 30 Units into the skin at bedtime.   lactulose 10 GM/15ML solution Commonly known as: CHRONULAC Take by mouth daily.   latanoprost 0.005 % ophthalmic solution Commonly known as: XALATAN Place 1 drop into both eyes at bedtime.    levothyroxine 75 MCG tablet Commonly known as: SYNTHROID Take 1 tablet (75 mcg total) by mouth daily.   LORazepam 0.5 MG tablet Commonly known as: ATIVAN Take 1 tablet (0.5 mg total) by mouth at bedtime.   metFORMIN 500 MG 24 hr tablet Commonly known as: GLUCOPHAGE-XR Take 1 tablet (500 mg total) by mouth daily.   metoprolol succinate 25 MG 24 hr tablet Commonly known as: TOPROL-XL Take 12.5 mg by mouth daily.   nystatin powder Commonly known as: MYCOSTATIN/NYSTOP Apply 1 application  topically 2 (two) times daily.   pantoprazole 40 MG tablet Commonly known as: PROTONIX Take 1 tablet (40 mg total) by mouth daily.   potassium chloride SA 20 MEQ tablet Commonly known as: KLOR-CON Take 20 mEq by mouth daily.   simvastatin 20 MG tablet Commonly known as: ZOCOR TAKE ONE TABLET BY MOUTH EVERY NIGHT AT BEDTIME   sitaGLIPtin 50 MG tablet Commonly known as: JANUVIA Take 50 mg by mouth daily.   spironolactone 50 MG tablet Commonly known as: Aldactone Take 1 tablet (50 mg total) by mouth daily.   torsemide 20 MG tablet Commonly known as: DEMADEX Take 40 mg by mouth daily.   vitamin C 1000 MG tablet Take 1,000 mg by mouth at bedtime.   vitamin E 180 MG (400 UNITS) capsule Take 400 Units by mouth every evening.        Review of Systems  Constitutional:  Negative for appetite change, fatigue and fever.       Weight gained about #10Ibs in the past 2 wks.   HENT:  Positive for hearing loss. Negative for congestion and trouble swallowing.   Eyes:  Negative for visual disturbance.  Respiratory:  Positive for shortness of breath. Negative for cough.        DOE  Cardiovascular:  Positive for leg swelling. Negative for chest pain and palpitations.  Gastrointestinal:  Negative for abdominal pain and constipation.  Genitourinary:  Negative for difficulty urinating, dysuria and urgency.  Musculoskeletal:  Positive for arthralgias. Negative for gait problem.  Skin:  Negative  for color change and rash.  Neurological:  Negative for speech difficulty, weakness and light-headedness.  Psychiatric/Behavioral:  Negative for behavioral problems and sleep disturbance. The patient is not nervous/anxious.    Immunization History  Administered Date(s) Administered   Fluad Quad(high Dose 65+) 10/27/2018, 02/09/2020   Hepatitis B, adult 05/12/2014   Hepatitis B, ped/adol 08/10/2014   Influenza Split 11/02/2009, 10/15/2010   Influenza, High Dose Seasonal PF 10/13/2014, 01/26/2017   Influenza, Quadrivalent, Recombinant, Inj, Pf 09/30/2017   Influenza, Seasonal, Injecte, Preservative Fre 10/13/2014   Influenza,inj,quad, With Preservative 11/22/2016   Influenza-Unspecified 11/25/2013, 10/06/2015, 10/22/2017   Moderna Sars-Covid-2 Vaccination 03/26/2019, 04/23/2019, 06/21/2020   PFIZER(Purple Top)SARS-COV-2 Vaccination 11/21/2019   Pneumococcal Conjugate-13 05/17/2014   Pneumococcal Polysaccharide-23 10/06/2015   Tdap 10/02/2011, 11/06/2016   Zoster, Live 01/22/2005   Pertinent  Health Maintenance Due  Topic Date Due   FOOT EXAM  05/12/2015   URINE MICROALBUMIN  04/27/2020   OPHTHALMOLOGY EXAM  05/17/2020   HEMOGLOBIN A1C  07/13/2020   INFLUENZA VACCINE  08/22/2020   DEXA SCAN  Completed   PNA vac Low Risk Adult  Addressed   COLONOSCOPY (Pts 45-21yr Insurance coverage will need to be confirmed)  Discontinued   Fall Risk  04/28/2019 01/09/2018 10/26/2016 10/09/2016 11/25/2015  Falls in the past year? 1 0 Yes No Yes  Number falls in past yr: 0 - 2 or more - 2 or more  Injury with Fall? 0 - No - Yes  Comment - - - - -  Risk Factor Category  - - High Fall Risk - High Fall Risk  Risk for fall due to : - - Impaired balance/gait - -  Follow up Falls evaluation completed - - - Falls prevention discussed   Functional Status Survey:    Vitals:   07/01/20 1045  BP: 109/61  Pulse: 98  Resp: 18  Temp: 98.1 F (36.7 C)  SpO2: 97%  Weight: 179 lb (81.2 kg)  Height: _0   (1.702 m)   Body mass index is 28.04 kg/m. Physical Exam Vitals and nursing note reviewed.  Constitutional:      Appearance: Normal appearance.  HENT:     Head: Normocephalic and atraumatic.     Mouth/Throat:     Mouth: Mucous membranes are moist.  Eyes:     Extraocular Movements: Extraocular movements intact.     Conjunctiva/sclera: Conjunctivae normal.     Pupils: Pupils are equal, round, and reactive to light.  Cardiovascular:     Rate and Rhythm: Tachycardia present. Rhythm irregular.     Heart sounds: No murmur heard. Pulmonary:     Effort: Pulmonary effort is normal.     Breath sounds: No wheezing, rhonchi or rales.     Comments: bibasilar Abdominal:     General: Bowel sounds are normal.     Palpations: Abdomen is soft.     Tenderness: There is no abdominal tenderness.  Musculoskeletal:     Cervical back: Normal range of motion and neck supple.     Right lower leg: Edema present.     Left lower leg: Edema present.     Comments: 1+ edema BLE. C/o lower back at night.   Skin:    General: Skin is warm and dry.     Findings: No rash.     Comments: Venous insufficiency skin changes BLE  Neurological:     General: No focal deficit present.     Mental Status: She is alert. Mental status is at baseline.     Gait: Gait abnormal.     Comments: Oriented to person, place.   Psychiatric:        Mood and Affect: Mood normal.        Behavior: Behavior normal.        Thought Content: Thought content normal.        Judgment: Judgment normal.    Labs reviewed: Recent Labs    12/14/19 1113 01/12/20 1241 01/13/20 0610 01/14/20 0618 03/01/20 1336 03/17/20 0000 03/24/20 1452 04/19/20 0000 05/18/20 0745 06/03/20 0000 06/22/20 0926  NA 137   < > 141   < > 134   < > 135   < > 135 137 138  K 3.6   < > 3.4*   < > 4.4   < > 4.4   < > 4.0 4.2 3.9  CL 99   < > 105   < > 96   < > 98   < > 98 106 98  CO2 24   < > 23   < > 23   < > 31   < > 27 24* 32  GLUCOSE 332*   < > 181*    < > 385*  --  242*  --  213*  --  116*  BUN 13   < > 22   < > 19   < > 24*   < > _1 CREATININE 1.04*   < > 1.11*   < > 1.18*   < > 1.31*   < > 1.25* 0.9 1.31*  CALCIUM 8.8   < > 9.2   < > 9.4   < > 10.2   < > 9.5 9.4 10.5*  MG 1.5*  --  2.2  --  1.6  --   --   --   --   --   --    < > = values in this interval not displayed.  Recent Labs    03/24/20 1452 05/18/20 0745 06/03/20 0000 06/22/20 0926  AST _0 ALT _1 ALKPHOS 75 87 85 101  BILITOT 1.1 1.7*  --  2.0*  PROT 5.9* 6.2*  --  6.1*  ALBUMIN 3.6 3.4* 3.2* 3.7   Recent Labs    03/24/20 1452 05/18/20 0745 06/22/20 0926  WBC 5.5 5.3 6.4  NEUTROABS 3.9 3.8 4.7  HGB 9.3* 8.8* 12.2  HCT 29.7* 28.8* 37.4  MCV 88.9 79.6* 90.1  PLT 77* 68* 61*   Lab Results  Component Value Date   TSH 2.27 03/17/2020   Lab Results  Component Value Date   HGBA1C 7.6 (H) 01/13/2020   Lab Results  Component Value Date   CHOL 112 11/09/2019   HDL 46 (L) 11/09/2019   LDLCALC 52 11/09/2019   TRIG 61 11/09/2019   CHOLHDL 2.4 11/09/2019    Significant Diagnostic Results in last 30 days:  No results found.  Assessment/Plan Chronic diastolic (congestive) heart failure (HCC) Edema chronic, trace to 1+ BLE, Takes Spironolactone,  Torsemide since 04/28/20.  EF 60-65%. Bun/creat 22/1.31 06/22/20  CKD (chronic kidney disease) stage 3, GFR 30-59 ml/min (HCC) CKD Na 138, K 3.9, Bun 22, creat 1.31, eGFR 40 06/22/20  Essential hypertension Blood pressure is controlled, continue Metoprolol.   Chronic atrial fibrillation (HCC) Heart rate is in control, symptomatic, f/u Cardiology. Takes Metoprolol.   GERD (gastroesophageal reflux disease)  takes Pantoprazole, f/u GI, gastric antral vascular ectasia.  Hypothyroidism takes Levothyroxine 42mg qd, TSH 2.27 03/17/20  Anxiety take Lorazepam 0.5107mqhs  Pleural effusion on right ast seen by pulmonology 03/01/20. Last hospitalization 12/2019, underwent thoracentesis 01/13/21  and 2 litter fluid removed, negative culture, probably a transudate from liver cirrhosis  Anemia in chronic kidney disease  takes Folic acid. Gastric antral vascular ectasia. Iron infusion, f/u oncology,  Hgb 12.2, Iron 75 6/7/0/48Alcoholic cirrhosis of liver with ascites (HCC) Alcoholic cirrhosis with ascites/portal hypertensive gastropathy, takes Lactulose, Aldactone. US[place]abd 04/29/20 no abnormal fluid in the peritoneum, a small amount of debirs on the gallbladder floor, no shadowing stones, large splenomegaly                 Family/ staff Communication: plan of care reviewed with the patient and charge nurse.   Labs/tests ordered:  Hgb a1c.   Time spend 40 minutes.

## 2020-07-01 NOTE — Assessment & Plan Note (Signed)
Edema chronic, trace to 1+ BLE, Takes Spironolactone,  Torsemide since 04/28/20.  EF 60-65%. Bun/creat 22/1.31 06/22/20

## 2020-07-01 NOTE — Assessment & Plan Note (Signed)
ast seen by pulmonology 03/01/20. Last hospitalization 12/2019, underwent thoracentesis 01/13/21 and 2 litter fluid removed, negative culture, probably a transudate from liver cirrhosis

## 2020-07-01 NOTE — Assessment & Plan Note (Addendum)
takes Levothyroxine 22mcg qd, TSH 2.27 03/17/20

## 2020-07-01 NOTE — Assessment & Plan Note (Signed)
Blood pressure is controlled, continue Metoprolol. 

## 2020-07-01 NOTE — Assessment & Plan Note (Signed)
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 [place] abd 04/29/20 no abnormal fluid in the peritoneum, a small amount of debirs on the gallbladder floor, no shadowing stones, large splenomegaly

## 2020-07-01 NOTE — Assessment & Plan Note (Signed)
take Lorazepam 0.5mg  qhs

## 2020-07-05 LAB — HEMOGLOBIN A1C: Hemoglobin A1C: 6.7

## 2020-07-08 ENCOUNTER — Encounter: Payer: Self-pay | Admitting: Nurse Practitioner

## 2020-07-08 DIAGNOSIS — R4189 Other symptoms and signs involving cognitive functions and awareness: Secondary | ICD-10-CM | POA: Insufficient documentation

## 2020-07-08 DIAGNOSIS — F0390 Unspecified dementia without behavioral disturbance: Secondary | ICD-10-CM | POA: Insufficient documentation

## 2020-07-29 ENCOUNTER — Non-Acute Institutional Stay: Payer: HMO | Admitting: Nurse Practitioner

## 2020-07-29 ENCOUNTER — Encounter: Payer: Self-pay | Admitting: Nurse Practitioner

## 2020-07-29 DIAGNOSIS — R296 Repeated falls: Secondary | ICD-10-CM | POA: Diagnosis not present

## 2020-07-29 DIAGNOSIS — J90 Pleural effusion, not elsewhere classified: Secondary | ICD-10-CM

## 2020-07-29 DIAGNOSIS — F419 Anxiety disorder, unspecified: Secondary | ICD-10-CM

## 2020-07-29 DIAGNOSIS — I10 Essential (primary) hypertension: Secondary | ICD-10-CM

## 2020-07-29 DIAGNOSIS — K219 Gastro-esophageal reflux disease without esophagitis: Secondary | ICD-10-CM

## 2020-07-29 DIAGNOSIS — I5032 Chronic diastolic (congestive) heart failure: Secondary | ICD-10-CM

## 2020-07-29 DIAGNOSIS — E1165 Type 2 diabetes mellitus with hyperglycemia: Secondary | ICD-10-CM | POA: Diagnosis not present

## 2020-07-29 DIAGNOSIS — E039 Hypothyroidism, unspecified: Secondary | ICD-10-CM

## 2020-07-29 DIAGNOSIS — I482 Chronic atrial fibrillation, unspecified: Secondary | ICD-10-CM

## 2020-07-29 DIAGNOSIS — K7031 Alcoholic cirrhosis of liver with ascites: Secondary | ICD-10-CM

## 2020-07-29 DIAGNOSIS — N1832 Chronic kidney disease, stage 3b: Secondary | ICD-10-CM

## 2020-07-29 DIAGNOSIS — Z794 Long term (current) use of insulin: Secondary | ICD-10-CM

## 2020-07-29 DIAGNOSIS — D631 Anemia in chronic kidney disease: Secondary | ICD-10-CM

## 2020-07-29 NOTE — Assessment & Plan Note (Signed)
Type 2 diabetes, chronic insulin dependent, takes Lantus,  Novolog with meals  f/u endocrinology. Takes Metformin, Januvia. Hgb a1c 6.7 07/05/20

## 2020-07-29 NOTE — Progress Notes (Addendum)
Location:   [facility] Room Number: 830 Place of Service:  ALF [phone]) Provider: Lennie Odor Eleftherios Dudenhoeffer NP  Virgie Dad, MD  Patient Care Team: Virgie Dad, MD as PCP - General (Internal Medicine) Berniece Salines, DO as PCP - Cardiology (Cardiology) Sheryn Bison, MD as Referring Physician (Dermatology) Gatha Mayer, MD as Consulting Physician (Gastroenterology) Marin Olp Rudell Cobb, MD as Consulting Physician (Oncology) Janan Ridge, MD as Consulting Physician (Dermatology) Paralee Cancel, MD as Consulting Physician (Orthopedic Surgery) Jacelyn Pi, MD as Consulting Physician (Endocrinology) Day, Melvenia Beam, [facility] (Inactive) as Pharmacist (Pharmacist) Berniece Salines, DO as Consulting Physician (Cardiology)  Extended Emergency Contact Information Primary Emergency Contact: Trinidad Curet [place] Phone: [phone] Mobile Phone: [phone] Relation: Son Secondary Emergency Contact: Dashanae, [place] Mobile Phone: [phone] Relation: Relative Preferred language: English Interpreter needed? No  Code Status: DNR Goals of care: Advanced Directive information Advanced Directives 07/01/2020  Does Patient Have a Medical Advance Directive? -  Type of Paramedic of [place];Living will;Out of facility DNR (pink MOST or yellow form)  Does patient want to make changes to medical advance directive? -  Copy of [place] in Chart? Yes - validated most recent copy scanned in chart (See row information)  Would patient like information on creating a medical advance directive? -  Pre-existing out of facility DNR order (yellow form or pink MOST form) Yellow form placed in chart (order not valid for inpatient use)     Chief Complaint  Patient presents with  . Acute Visit    Fall, rapid heart beats.     HPI:  Pt is a 87 y.o. female seen today for an acute visit for reported fall 07/28/20 when the patient was found seated on the  floor. The patient she got dizzy and fell when she went over by her closet. The patient denied dizziness, headache, change of vision, chest pain/pressure, palpitation, no noted focal weakness.  Type 2 diabetes, chronic insulin dependent             Lantus,  Novolog with meals  f/u endocrinology. Takes Metformin, Januvia. Hgb a1c 6.7 07/05/20             CHF/Edema chronic, Takes Spironolactone,  Torsemide since 04/28/20.  EF 60-65%. Bun/creat 22/1.31 06/22/20             CKD Na 138, K 3.9, Bun 22, creat 1.31, eGFR 40 06/22/20             HTN, takes Metoprolol             Afib, rapid heart beats,  asymptomatic, f/u Cardiology. Takes Metoprolol.              GERD, takes Pantoprazole, f/u GI, gastric antral vascular ectasia.             Hypothyroidism, takes Levothyroxine 73mg qd, TSH 2.27 03/17/20             Anxiety, take Lorazepam 0.581mqhs             Pleural effusion, last seen by pulmonology 03/01/20. Last hospitalization 12/2019, underwent thoracentesis 01/13/21 and 2 litter fluid removed, negative culture, probably a transudate from liver cirrhosis             Anemia, takes Folic acid. Gastric antral vascular ectasia. Splenomegaly.  Iron infusion, f/u oncology,  Hgb 12.2, Iron 75 06/22/20             Alcoholic cirrhosis  with ascites/portal hypertensive gastropathy, takes Lactulose, Aldactone. [place] abd 04/29/20 no abnormal fluid in the peritoneum, a small amount of debirs on the gallbladder floor, no shadowing stones, large splenomegaly                  Past Medical History:  Diagnosis Date  . Anemia   . Anemia of chronic renal failure, stage 3 (moderate) ([place]) 03/24/2020  . Angiodysplasia of ascending colon 10/25/2014  . Anxiety   . Arthritis   . Borderline diabetes   . Cancer of the skin, basal cell 09/03/2012  . Cirrhosis ([place])   . Diabetes mellitus without complication ([place])   . Diverticular disease   . 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 GERD (gastroesophageal reflux disease)   .  Hypertension   . Iron deficiency anemia due to chronic blood loss 01/19/2015  . Portal hypertensive gastropathy ([place]) 08/02/2016   ? Some GAVE also  . Thyroid disease    Past Surgical History:  Procedure Laterality Date  . ABDOMINAL HYSTERECTOMY    . APPENDECTOMY    . COLONOSCOPY    . ESOPHAGOGASTRODUODENOSCOPY    . ESOPHAGOGASTRODUODENOSCOPY (EGD) WITH PROPOFOL N/A 02/22/2020   Procedure: ESOPHAGOGASTRODUODENOSCOPY (EGD) WITH PROPOFOL;  Surgeon: Gatha Mayer, MD;  Location: [facility];  Service: Endoscopy;  Laterality: N/A;  . IR PARACENTESIS  05/25/2016  . IR THORACENTESIS ASP PLEURAL SPACE W/IMG GUIDE  02/23/2020  . LEG SKIN LESION  BIOPSY / EXCISION  12/11/14  . MOHS SURGERY     ankle  . TONSILLECTOMY    . TOTAL HIP ARTHROPLASTY Bilateral 1993, 2006  . UPPER GASTROINTESTINAL ENDOSCOPY  09/09/2017    Allergies  Allergen Reactions  . Tizanidine Other (See Comments)    Hallucinate, confused     Allergies as of 07/29/2020       Reactions   Tizanidine Other (See Comments)   Hallucinate, confused         Medication List        Accurate as of July 29, 2020 11:59 PM. If you have any questions, ask your nurse or doctor.          Accu-Chek FastClix Lancets Misc CHECK FOR BLOOD SUGAR DAILY   acetaminophen 325 MG tablet Commonly known as: TYLENOL Take 650 mg by mouth every 6 (six) hours as needed.   folic acid 1 MG tablet Commonly known as: FOLVITE Take 1 tablet (1 mg total) by mouth daily.   glucose blood test strip 1 each by Other route 2 (two) times daily. Use as instructed-Check blood glucose prior to administering insulin   insulin aspart 100 UNIT/ML injection Commonly known as: novoLOG Inject 10 Units into the skin 3 (three) times daily. CBG more then 100   insulin glargine 100 UNIT/ML injection Commonly known as: LANTUS Inject 55 Units into the skin every morning.   insulin glargine 100 UNIT/ML injection Commonly known as: LANTUS Inject 30 Units  into the skin at bedtime.   lactulose 10 GM/15ML solution Commonly known as: CHRONULAC Take by mouth daily.   latanoprost 0.005 % ophthalmic solution Commonly known as: XALATAN Place 1 drop into both eyes at bedtime.   levothyroxine 75 MCG tablet Commonly known as: SYNTHROID Take 1 tablet (75 mcg total) by mouth daily.   LORazepam 0.5 MG tablet Commonly known as: ATIVAN Take 1 tablet (0.5 mg total) by mouth at bedtime.   metFORMIN 500 MG 24 hr tablet Commonly known as: GLUCOPHAGE-XR Take 1 tablet (500 mg total) by mouth  daily.   metoprolol succinate 25 MG 24 hr tablet Commonly known as: TOPROL-XL Take 12.5 mg by mouth daily.   nystatin powder Commonly known as: MYCOSTATIN/NYSTOP Apply 1 application topically 2 (two) times daily.   pantoprazole 40 MG tablet Commonly known as: PROTONIX Take 1 tablet (40 mg total) by mouth daily.   potassium chloride SA 20 MEQ tablet Commonly known as: KLOR-CON Take 20 mEq by mouth daily.   simvastatin 20 MG tablet Commonly known as: ZOCOR TAKE ONE TABLET BY MOUTH EVERY NIGHT AT BEDTIME   sitaGLIPtin 50 MG tablet Commonly known as: JANUVIA Take 50 mg by mouth daily.   spironolactone 50 MG tablet Commonly known as: Aldactone Take 1 tablet (50 mg total) by mouth daily.   torsemide 20 MG tablet Commonly known as: DEMADEX Take 40 mg by mouth daily.   vitamin C 1000 MG tablet Take 1,000 mg by mouth at bedtime.   vitamin E 180 MG (400 UNITS) capsule Take 400 Units by mouth every evening.        Review of Systems  Constitutional:  Negative for appetite change, fatigue and fever.  HENT:  Positive for hearing loss. Negative for congestion and trouble swallowing.   Eyes:  Negative for visual disturbance.  Respiratory:  Positive for shortness of breath. Negative for cough.        DOE  Cardiovascular:  Positive for leg swelling. Negative for chest pain and palpitations.  Gastrointestinal:  Negative for abdominal pain and  constipation.  Genitourinary:  Negative for difficulty urinating, dysuria and urgency.  Musculoskeletal:  Positive for arthralgias. Negative for gait problem.  Skin:  Negative for color change and rash.  Neurological:  Positive for dizziness. Negative for speech difficulty, weakness and light-headedness.       Yesterday when she fell  Psychiatric/Behavioral:  Negative for behavioral problems and sleep disturbance. The patient is not nervous/anxious.    Immunization History  Administered Date(s) Administered  . Fluad Quad(high Dose 65+) 10/27/2018, 02/09/2020  . Hepatitis B, adult 05/12/2014  . Hepatitis B, ped/adol 08/10/2014  . Influenza Split 11/02/2009, 10/15/2010  . Influenza, High Dose Seasonal PF 10/13/2014, 01/26/2017  . Influenza, Quadrivalent, Recombinant, Inj, Pf 09/30/2017  . Influenza, Seasonal, Injecte, Preservative Fre 10/13/2014  . Influenza,inj,quad, With Preservative 11/22/2016  . Influenza-Unspecified 11/25/2013, 10/06/2015, 10/22/2017  . Moderna Sars-Covid-2 Vaccination 03/26/2019, 04/23/2019, 06/21/2020  . PFIZER(Purple Top)SARS-COV-2 Vaccination 11/21/2019  . Pneumococcal Conjugate-13 05/17/2014  . Pneumococcal Polysaccharide-23 10/06/2015  . Tdap 10/02/2011, 11/06/2016  . Zoster, Live 01/22/2005   Pertinent  Health Maintenance Due  Topic Date Due  . FOOT EXAM  05/12/2015  . URINE MICROALBUMIN  04/27/2020  . OPHTHALMOLOGY EXAM  05/17/2020  . HEMOGLOBIN A1C  07/13/2020  . INFLUENZA VACCINE  08/22/2020  . DEXA SCAN  Completed  . PNA vac Low Risk Adult  Addressed  . COLONOSCOPY (Pts 45-40yr Insurance coverage will need to be confirmed)  Discontinued   Fall Risk  04/28/2019 01/09/2018 10/26/2016 10/09/2016 11/25/2015  Falls in the past year? 1 0 Yes No Yes  Number falls in past yr: 0 - 2 or more - 2 or more  Injury with Fall? 0 - No - Yes  Comment - - - - -  Risk Factor Category  - - High Fall Risk - High Fall Risk  Risk for fall due to : - - Impaired  balance/gait - -  Follow up Falls evaluation completed - - - Falls prevention discussed   Functional Status Survey:    Vitals:  07/29/20 1016  BP: 110/60  Pulse: (!) 122  Resp: 20  Temp: 98.4 F (36.9 C)  SpO2: 93%   There is no height or weight on file to calculate BMI. Physical Exam Vitals and nursing note reviewed.  Constitutional:      Appearance: Normal appearance.  HENT:     Head: Normocephalic and atraumatic.     Mouth/Throat:     Mouth: Mucous membranes are moist.  Eyes:     Extraocular Movements: Extraocular movements intact.     Conjunctiva/sclera: Conjunctivae normal.     Pupils: Pupils are equal, round, and reactive to light.  Cardiovascular:     Rate and Rhythm: Tachycardia present. Rhythm irregular.     Heart sounds: No murmur heard. Pulmonary:     Effort: Pulmonary effort is normal.     Breath sounds: No wheezing, rhonchi or rales.     Comments: bibasilar Abdominal:     General: Bowel sounds are normal.     Palpations: Abdomen is soft.     Tenderness: There is no abdominal tenderness.  Musculoskeletal:     Cervical back: Normal range of motion and neck supple.     Right lower leg: Edema present.     Left lower leg: Edema present.     Comments: 1+ edema BLE. C/o lower back at night.   Skin:    General: Skin is warm and dry.     Findings: No rash.     Comments: Venous insufficiency skin changes BLE  Neurological:     General: No focal deficit present.     Mental Status: She is alert. Mental status is at baseline.     Gait: Gait abnormal.     Comments: Oriented to person, place.   Psychiatric:        Mood and Affect: Mood normal.        Behavior: Behavior normal.        Thought Content: Thought content normal.        Judgment: Judgment normal.    Labs reviewed: Recent Labs    12/14/19 1113 01/12/20 1241 01/13/20 0610 01/14/20 0618 03/01/20 1336 03/17/20 0000 03/24/20 1452 04/19/20 0000 05/18/20 0745 06/03/20 0000 06/22/20 0926  NA  137   < > 141   < > 134   < > 135   < > 135 137 138  K 3.6   < > 3.4*   < > 4.4   < > 4.4   < > 4.0 4.2 3.9  CL 99   < > 105   < > 96   < > 98   < > 98 106 98  CO2 24   < > 23   < > 23   < > 31   < > 27 24* 32  GLUCOSE 332*   < > 181*   < > 385*  --  242*  --  213*  --  116*  BUN 13   < > 22   < > 19   < > 24*   < > 23 19 22   CREATININE 1.04*   < > 1.11*   < > 1.18*   < > 1.31*   < > 1.25* 0.9 1.31*  CALCIUM 8.8   < > 9.2   < > 9.4   < > 10.2   < > 9.5 9.4 10.5*  MG 1.5*  --  2.2  --  1.6  --   --   --   --   --   --    < > =  values in this interval not displayed.   Recent Labs    03/24/20 1452 05/18/20 0745 06/03/20 0000 06/22/20 0926  AST 26 24 24 28   ALT 16 16 16 18   ALKPHOS 75 87 85 101  BILITOT 1.1 1.7*  --  2.0*  PROT 5.9* 6.2*  --  6.1*  ALBUMIN 3.6 3.4* 3.2* 3.7   Recent Labs    03/24/20 1452 05/18/20 0745 06/22/20 0926  WBC 5.5 5.3 6.4  NEUTROABS 3.9 3.8 4.7  HGB 9.3* 8.8* 12.2  HCT 29.7* 28.8* 37.4  MCV 88.9 79.6* 90.1  PLT 77* 68* 61*   Lab Results  Component Value Date   TSH 2.27 03/17/2020   Lab Results  Component Value Date   HGBA1C 7.6 (H) 01/13/2020   Lab Results  Component Value Date   CHOL 112 11/09/2019   HDL 46 (L) 11/09/2019   LDLCALC 52 11/09/2019   TRIG 61 11/09/2019   CHOLHDL 2.4 11/09/2019    Significant Diagnostic Results in last 30 days:  No results found.  Assessment/Plan: Multiple falls reported fall 07/28/20 when the patient was found seated on the floor. The patient she got dizzy and fell when she went over by her closet. The patient denied dizziness, headache, change of vision, chest pain/pressure, palpitation, no noted focal weakness. Will update CBC/diff, CMP/eGFR  Type 2 diabetes mellitus with hyperglycemia, with long-term current use of insulin (HCC) Type 2 diabetes, chronic insulin dependent, takes Lantus,  Novolog with meals  f/u endocrinology. Takes Metformin, Januvia. Hgb a1c 6.7 03/11/26  Chronic diastolic  (congestive) heart failure (HCC) CHF/Edema chronic, Takes Spironolactone,  Torsemide since 04/28/20.  EF 60-65%. Bun/creat 22/1.31 06/22/20  Essential hypertension Blood pressure is controlled, continue Metoprolol.   CKD (chronic kidney disease) stage 3, GFR 30-59 ml/min (HCC) CKD Na 138, K 3.9, Bun 22, creat 1.31, eGFR 40 06/22/20  Chronic atrial fibrillation (HCC) rapid heart beats, asymptomatic, f/u Cardiology. Takes Metoprolol.   GERD (gastroesophageal reflux disease) akes Pantoprazole, f/u GI, gastric antral vascular ectasia.  Adult hypothyroidism  takes Levothyroxine 61mg qd, TSH 2.27 03/17/20  Anxiety take Lorazepam 0.564mqhs  Pleural effusion on right  last seen by pulmonology 03/01/20. Last hospitalization 12/2019, underwent thoracentesis 01/13/21 and 2 litter fluid removed, negative culture, probably a transudate from liver cirrhosis  Anemia in chronic kidney disease Anemia, takes Folic acid. Gastric antral vascular ectasia. Splenomegaly.  Iron infusion, f/u oncology,  Hgb 12.2, Iron 75 6/8/3/37Alcoholic cirrhosis of liver with ascites (HCC) Alcoholic cirrhosis with ascites/portal hypertensive gastropathy, takes Lactulose, Aldactone. [place] 04/29/20 no abnormal fluid in the peritoneum, a small amount of debirs on the gallbladder floor, no shadowing stones, large splenomegaly    Family/ staff Communication: plan of care reviewed with the patient and charge nurse.   Labs/tests ordered:  none  Time spend 40 minutes.

## 2020-07-29 NOTE — Assessment & Plan Note (Signed)
reported fall 07/28/20 when the patient was found seated on the floor. The patient she got dizzy and fell when she went over by her closet. The patient denied dizziness, headache, change of vision, chest pain/pressure, palpitation, no noted focal weakness. Will update CBC/diff, CMP/eGFR

## 2020-07-29 NOTE — Assessment & Plan Note (Signed)
takes Levothyroxine 68mcg qd, TSH 2.27 03/17/20

## 2020-07-29 NOTE — Assessment & Plan Note (Signed)
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 [place] abd 04/29/20 no abnormal fluid in the peritoneum, a small amount of debirs on the gallbladder floor, no shadowing stones, large splenomegaly

## 2020-07-29 NOTE — Assessment & Plan Note (Signed)
Blood pressure is controlled, continue Metoprolol. 

## 2020-07-29 NOTE — Assessment & Plan Note (Signed)
akes Pantoprazole, f/u GI, gastric antral vascular ectasia.

## 2020-07-29 NOTE — Assessment & Plan Note (Signed)
take Lorazepam 0.5mg  qhs

## 2020-07-29 NOTE — Assessment & Plan Note (Signed)
CKD Na 138, K 3.9, Bun 22, creat 1.31, eGFR 40 06/22/20

## 2020-07-29 NOTE — Assessment & Plan Note (Signed)
CHF/Edema chronic, Takes Spironolactone,  Torsemide since 04/28/20.  EF 60-65%. Bun/creat 22/1.31 06/22/20

## 2020-07-29 NOTE — Assessment & Plan Note (Addendum)
rapid heart beats, asymptomatic, f/u Cardiology. Takes Metoprolol.

## 2020-07-29 NOTE — Assessment & Plan Note (Signed)
last seen by pulmonology 03/01/20. Last hospitalization 12/2019, underwent thoracentesis 01/13/21 and 2 litter fluid removed, negative culture, probably a transudate from liver cirrhosis

## 2020-07-29 NOTE — Assessment & Plan Note (Signed)
Anemia, takes Folic acid. Gastric antral vascular ectasia. Splenomegaly.  Iron infusion, f/u oncology,  Hgb 12.2, Iron 75 06/22/20

## 2020-08-02 LAB — HEPATIC FUNCTION PANEL
ALT: 14 (ref 7–35)
AST: 29 (ref 13–35)
Alkaline Phosphatase: 94 (ref 25–125)
Bilirubin, Total: 1.6

## 2020-08-02 LAB — BASIC METABOLIC PANEL
BUN: 15 (ref 4–21)
CO2: 29 — AB (ref 13–22)
Chloride: 102 (ref 99–108)
Creatinine: 1.2 — AB (ref 0.5–1.1)
Glucose: 123
Potassium: 3.6 (ref 3.4–5.3)
Sodium: 139 (ref 137–147)

## 2020-08-02 LAB — CBC: RBC: 3.54 — AB (ref 3.87–5.11)

## 2020-08-02 LAB — CBC AND DIFFERENTIAL
HCT: 32 — AB (ref 36–46)
Hemoglobin: 10.8 — AB (ref 12.0–16.0)
Neutrophils Absolute: 3630
Platelets: 73 — AB (ref 150–399)
WBC: 5

## 2020-08-02 LAB — COMPREHENSIVE METABOLIC PANEL
Albumin: 3.3 — AB (ref 3.5–5.0)
Calcium: 9.1 (ref 8.7–10.7)
Globulin: 2.5

## 2020-08-05 ENCOUNTER — Encounter: Payer: Self-pay | Admitting: Family

## 2020-08-05 ENCOUNTER — Inpatient Hospital Stay: Payer: HMO | Admitting: Family

## 2020-08-05 ENCOUNTER — Telehealth: Payer: Self-pay

## 2020-08-05 ENCOUNTER — Other Ambulatory Visit: Payer: Self-pay

## 2020-08-05 ENCOUNTER — Inpatient Hospital Stay: Payer: HMO | Attending: Hematology & Oncology

## 2020-08-05 VITALS — BP 97/47 | HR 108 | Temp 97.9°F | Resp 20 | Wt 187.0 lb

## 2020-08-05 DIAGNOSIS — D649 Anemia, unspecified: Secondary | ICD-10-CM

## 2020-08-05 DIAGNOSIS — D631 Anemia in chronic kidney disease: Secondary | ICD-10-CM

## 2020-08-05 DIAGNOSIS — D500 Iron deficiency anemia secondary to blood loss (chronic): Secondary | ICD-10-CM

## 2020-08-05 DIAGNOSIS — K922 Gastrointestinal hemorrhage, unspecified: Secondary | ICD-10-CM | POA: Diagnosis not present

## 2020-08-05 DIAGNOSIS — D696 Thrombocytopenia, unspecified: Secondary | ICD-10-CM | POA: Diagnosis present

## 2020-08-05 DIAGNOSIS — N1832 Chronic kidney disease, stage 3b: Secondary | ICD-10-CM

## 2020-08-05 DIAGNOSIS — R195 Other fecal abnormalities: Secondary | ICD-10-CM

## 2020-08-05 LAB — RETICULOCYTES
Immature Retic Fract: 18.5 % — ABNORMAL HIGH (ref 2.3–15.9)
RBC.: 3.46 MIL/uL — ABNORMAL LOW (ref 3.87–5.11)
Retic Count, Absolute: 117.3 10*3/uL (ref 19.0–186.0)
Retic Ct Pct: 3.4 % — ABNORMAL HIGH (ref 0.4–3.1)

## 2020-08-05 LAB — CMP (CANCER CENTER ONLY)
ALT: 17 U/L (ref 0–44)
AST: 31 U/L (ref 15–41)
Albumin: 3.5 g/dL (ref 3.5–5.0)
Alkaline Phosphatase: 101 U/L (ref 38–126)
Anion gap: 10 (ref 5–15)
BUN: 18 mg/dL (ref 8–23)
CO2: 28 mmol/L (ref 22–32)
Calcium: 9.5 mg/dL (ref 8.9–10.3)
Chloride: 97 mmol/L — ABNORMAL LOW (ref 98–111)
Creatinine: 1.26 mg/dL — ABNORMAL HIGH (ref 0.44–1.00)
GFR, Estimated: 42 mL/min — ABNORMAL LOW (ref >60)
Glucose, Bld: 287 mg/dL — ABNORMAL HIGH (ref 70–99)
Potassium: 4.2 mmol/L (ref 3.5–5.1)
Sodium: 135 mmol/L (ref 135–145)
Total Bilirubin: 2.1 mg/dL — ABNORMAL HIGH (ref 0.3–1.2)
Total Protein: 5.6 g/dL — ABNORMAL LOW (ref 6.5–8.1)

## 2020-08-05 LAB — CBC WITH DIFFERENTIAL (CANCER CENTER ONLY)
Abs Immature Granulocytes: 0.08 10*3/uL — ABNORMAL HIGH (ref 0.00–0.07)
Basophils Absolute: 0 10*3/uL (ref 0.0–0.1)
Basophils Relative: 0 %
Eosinophils Absolute: 0.1 10*3/uL (ref 0.0–0.5)
Eosinophils Relative: 2 %
HCT: 31.8 % — ABNORMAL LOW (ref 36.0–46.0)
Hemoglobin: 10.6 g/dL — ABNORMAL LOW (ref 12.0–15.0)
Immature Granulocytes: 1 %
Lymphocytes Relative: 12 %
Lymphs Abs: 0.8 10*3/uL (ref 0.7–4.0)
MCH: 30.8 pg (ref 26.0–34.0)
MCHC: 33.3 g/dL (ref 30.0–36.0)
MCV: 92.4 fL (ref 80.0–100.0)
Monocytes Absolute: 0.6 10*3/uL (ref 0.1–1.0)
Monocytes Relative: 9 %
Neutro Abs: 4.9 10*3/uL (ref 1.7–7.7)
Neutrophils Relative %: 76 %
Platelet Count: 70 10*3/uL — ABNORMAL LOW (ref 150–400)
RBC: 3.44 MIL/uL — ABNORMAL LOW (ref 3.87–5.11)
RDW: 14.8 % (ref 11.5–15.5)
WBC Count: 6.4 10*3/uL (ref 4.0–10.5)
nRBC: 0 % (ref 0.0–0.2)

## 2020-08-05 LAB — SAMPLE TO BLOOD BANK

## 2020-08-05 NOTE — Progress Notes (Signed)
Hematology and Oncology Follow Up Visit  Jessica Dennis [id number] [birth year] 87 y.o. 08/05/2020   Principle Diagnosis:  Chronic thrombocytopenia secondary to cirrhosis and splenomegaly Iron deficiency anemia secondary to GI bleed    Current Therapy:        IV iron as indicated  Red blood cell transfusion as needed for variceal bleeding    Interim History:  Jessica Dennis is here today for follow-up. She is doing well and states that she is enjoying living at [facility] now.  She has not noted any blood loss. No abnormal bruising, no petechiae.  She has some mild fatigue at times.  No fever, chills, n/v, cough, rash, dizziness, SOB, chest pain, palpitations, abdominal pain or changes in bowel or bladder habits.  No tenderness, numbness or tingling in her extremities.  The swelling in her feet and ankles is unchanged from baseline.  No falls or syncope to report. She ambulates with a rolling walker for added support.  She states that she is eating well and staying properly hydrated. Her weight is stable at 187 lbs.   ECOG Performance Status: 1 - Symptomatic but completely ambulatory  Medications:  Allergies as of 08/05/2020       Reactions   Tizanidine Other (See Comments)   Hallucinate, confused         Medication List        Accurate as of August 05, 2020 11:15 AM. If you have any questions, ask your nurse or doctor.          Accu-Chek FastClix Lancets Misc CHECK FOR BLOOD SUGAR DAILY   acetaminophen 325 MG tablet Commonly known as: TYLENOL Take 650 mg by mouth every 6 (six) hours as needed.   folic acid 1 MG tablet Commonly known as: FOLVITE Take 1 tablet (1 mg total) by mouth daily.   glucose blood test strip 1 each by Other route 2 (two) times daily. Use as instructed-Check blood glucose prior to administering insulin   insulin aspart 100 UNIT/ML injection Commonly known as: novoLOG Inject 10 Units into the skin 3 (three) times daily. CBG more then 100    insulin glargine 100 UNIT/ML injection Commonly known as: LANTUS Inject 55 Units into the skin every morning.   insulin glargine 100 UNIT/ML injection Commonly known as: LANTUS Inject 30 Units into the skin at bedtime.   lactulose 10 GM/15ML solution Commonly known as: CHRONULAC Take by mouth daily.   latanoprost 0.005 % ophthalmic solution Commonly known as: XALATAN Place 1 drop into both eyes at bedtime.   levothyroxine 75 MCG tablet Commonly known as: SYNTHROID Take 1 tablet (75 mcg total) by mouth daily.   LORazepam 0.5 MG tablet Commonly known as: ATIVAN Take 1 tablet (0.5 mg total) by mouth at bedtime.   metFORMIN 500 MG 24 hr tablet Commonly known as: GLUCOPHAGE-XR Take 1 tablet (500 mg total) by mouth daily.   metoprolol succinate 25 MG 24 hr tablet Commonly known as: TOPROL-XL Take 12.5 mg by mouth daily.   nystatin powder Commonly known as: MYCOSTATIN/NYSTOP Apply 1 application topically 2 (two) times daily.   pantoprazole 40 MG tablet Commonly known as: PROTONIX Take 1 tablet (40 mg total) by mouth daily.   potassium chloride SA 20 MEQ tablet Commonly known as: KLOR-CON Take 20 mEq by mouth daily.   simvastatin 20 MG tablet Commonly known as: ZOCOR TAKE ONE TABLET BY MOUTH EVERY NIGHT AT BEDTIME   sitaGLIPtin 50 MG tablet Commonly known as: JANUVIA Take 50 mg by  mouth daily.   spironolactone 50 MG tablet Commonly known as: Aldactone Take 1 tablet (50 mg total) by mouth daily.   torsemide 20 MG tablet Commonly known as: DEMADEX Take 40 mg by mouth daily.   vitamin C 1000 MG tablet Take 1,000 mg by mouth at bedtime.   vitamin E 180 MG (400 UNITS) capsule Take 400 Units by mouth every evening.        Allergies:  Allergies  Allergen Reactions   Tizanidine Other (See Comments)    Hallucinate, confused     Past Medical History, Surgical history, Social history, and Family History were reviewed and updated.  Review of Systems: All  other 10 point review of systems is negative.   Physical Exam:  weight is 187 lb (84.8 kg). Her oral temperature is 97.9 F (36.6 C). Her blood pressure is 97/47 (abnormal) and her pulse is 108 (abnormal). Her respiration is 20 and oxygen saturation is 100%.   Wt Readings from Last 3 Encounters:  08/05/20 187 lb (84.8 kg)  07/01/20 179 lb (81.2 kg)  06/22/20 181 lb 1.3 oz (82.1 kg)    Ocular: Sclerae unicteric, pupils equal, round and reactive to light Ear-nose-throat: Oropharynx clear, dentition fair Lymphatic: No cervical or supraclavicular adenopathy Lungs no rales or rhonchi, good excursion bilaterally Heart regular rate and rhythm, no murmur appreciated Abd soft, nontender, positive bowel sounds MSK no focal spinal tenderness, no joint edema Neuro: non-focal, well-oriented, appropriate affect Breasts: Deferred   Lab Results  Component Value Date   WBC 6.4 08/05/2020   HGB 10.6 (L) 08/05/2020   HCT 31.8 (L) 08/05/2020   MCV 92.4 08/05/2020   PLT 70 (L) 08/05/2020   Lab Results  Component Value Date   FERRITIN 48 06/22/2020   IRON 75 06/22/2020   TIBC 286 06/22/2020   UIBC 211 06/22/2020   IRONPCTSAT 26 06/22/2020   Lab Results  Component Value Date   RETICCTPCT 3.4 (H) 08/05/2020   RBC 3.46 (L) 08/05/2020   No results found for: KPAFRELGTCHN, LAMBDASER, KAPLAMBRATIO No results found for: IGGSERUM, IGA, IGMSERUM No results found for: Odetta Pink, SPEI   Chemistry      Component Value Date/Time   NA 138 06/22/2020 0926   NA 137 06/03/2020 0000   NA 138 12/18/2016 1314   NA 139 03/05/2016 1139   K 3.9 06/22/2020 0926   K 4.3 12/18/2016 1314   K 3.6 03/05/2016 1139   CL 98 06/22/2020 0926   CL 101 12/18/2016 1314   CO2 32 06/22/2020 0926   CO2 28 12/18/2016 1314   CO2 26 03/05/2016 1139   BUN 22 06/22/2020 0926   BUN 19 06/03/2020 0000   BUN 33 (H) 12/18/2016 1314   BUN 14.3 03/05/2016 1139    CREATININE 1.31 (H) 06/22/2020 0926   CREATININE 1.02 (H) 11/09/2019 1137   CREATININE 0.7 03/05/2016 1139   GLU 243 06/03/2020 0000      Component Value Date/Time   CALCIUM 10.5 (H) 06/22/2020 0926   CALCIUM 10.0 12/18/2016 1314   CALCIUM 9.3 03/05/2016 1139   ALKPHOS 101 06/22/2020 0926   ALKPHOS 94 (H) 12/18/2016 1314   ALKPHOS 127 03/05/2016 1139   AST 28 06/22/2020 0926   AST 29 03/05/2016 1139   ALT 18 06/22/2020 0926   ALT 26 12/18/2016 1314   ALT 22 03/05/2016 1139   BILITOT 2.0 (H) 06/22/2020 0926   BILITOT 1.75 (H) 03/05/2016 1139  Impression and Plan: Jessica Dennis is a very pleasant 87 yo female with chronic thrombocytopenia and iron deficiency anemia secondary to intermittent GI blood loss.  Hgb is 10.6, no transfusion needed at this time.  Iron studies are pending. We will replace if needed.  Follow-up in another 6 weeks.  She can contact our office with any questions or concerns.   [name], NP 7/15/202211:15 AM

## 2020-08-08 ENCOUNTER — Encounter (HOSPITAL_COMMUNITY): Payer: Self-pay | Admitting: Emergency Medicine

## 2020-08-08 ENCOUNTER — Inpatient Hospital Stay (HOSPITAL_COMMUNITY)
Admission: EM | Admit: 2020-08-08 | Discharge: 2020-08-15 | DRG: 871 | Disposition: A | Discharge status: Skilled Nursing Facility | Payer: HMO | Source: Skilled Nursing Facility | Attending: Internal Medicine | Admitting: Internal Medicine

## 2020-08-08 ENCOUNTER — Other Ambulatory Visit: Payer: Self-pay

## 2020-08-08 ENCOUNTER — Emergency Department (HOSPITAL_COMMUNITY): Payer: HMO

## 2020-08-08 DIAGNOSIS — K7031 Alcoholic cirrhosis of liver with ascites: Secondary | ICD-10-CM | POA: Diagnosis present

## 2020-08-08 DIAGNOSIS — R300 Dysuria: Secondary | ICD-10-CM | POA: Diagnosis not present

## 2020-08-08 DIAGNOSIS — N390 Urinary tract infection, site not specified: Secondary | ICD-10-CM | POA: Diagnosis present

## 2020-08-08 DIAGNOSIS — Z833 Family history of diabetes mellitus: Secondary | ICD-10-CM

## 2020-08-08 DIAGNOSIS — Z794 Long term (current) use of insulin: Secondary | ICD-10-CM

## 2020-08-08 DIAGNOSIS — Z79890 Hormone replacement therapy (postmenopausal): Secondary | ICD-10-CM

## 2020-08-08 DIAGNOSIS — I5032 Chronic diastolic (congestive) heart failure: Secondary | ICD-10-CM | POA: Diagnosis present

## 2020-08-08 DIAGNOSIS — Z20822 Contact with and (suspected) exposure to covid-19: Secondary | ICD-10-CM | POA: Diagnosis present

## 2020-08-08 DIAGNOSIS — E872 Acidosis, unspecified: Secondary | ICD-10-CM | POA: Diagnosis present

## 2020-08-08 DIAGNOSIS — Z79899 Other long term (current) drug therapy: Secondary | ICD-10-CM

## 2020-08-08 DIAGNOSIS — A4181 Sepsis due to Enterococcus: Principal | ICD-10-CM | POA: Diagnosis present

## 2020-08-08 DIAGNOSIS — G9341 Metabolic encephalopathy: Secondary | ICD-10-CM | POA: Diagnosis present

## 2020-08-08 DIAGNOSIS — R7881 Bacteremia: Secondary | ICD-10-CM

## 2020-08-08 DIAGNOSIS — Z96643 Presence of artificial hip joint, bilateral: Secondary | ICD-10-CM | POA: Diagnosis present

## 2020-08-08 DIAGNOSIS — E1140 Type 2 diabetes mellitus with diabetic neuropathy, unspecified: Secondary | ICD-10-CM | POA: Diagnosis present

## 2020-08-08 DIAGNOSIS — D696 Thrombocytopenia, unspecified: Secondary | ICD-10-CM | POA: Diagnosis present

## 2020-08-08 DIAGNOSIS — I10 Essential (primary) hypertension: Secondary | ICD-10-CM | POA: Diagnosis present

## 2020-08-08 DIAGNOSIS — K862 Cyst of pancreas: Secondary | ICD-10-CM | POA: Diagnosis present

## 2020-08-08 DIAGNOSIS — E1169 Type 2 diabetes mellitus with other specified complication: Secondary | ICD-10-CM | POA: Diagnosis present

## 2020-08-08 DIAGNOSIS — Z90710 Acquired absence of both cervix and uterus: Secondary | ICD-10-CM

## 2020-08-08 DIAGNOSIS — Z66 Do not resuscitate: Secondary | ICD-10-CM | POA: Diagnosis present

## 2020-08-08 DIAGNOSIS — E1122 Type 2 diabetes mellitus with diabetic chronic kidney disease: Secondary | ICD-10-CM | POA: Diagnosis present

## 2020-08-08 DIAGNOSIS — E869 Volume depletion, unspecified: Secondary | ICD-10-CM | POA: Diagnosis present

## 2020-08-08 DIAGNOSIS — K219 Gastro-esophageal reflux disease without esophagitis: Secondary | ICD-10-CM | POA: Diagnosis present

## 2020-08-08 DIAGNOSIS — A419 Sepsis, unspecified organism: Secondary | ICD-10-CM

## 2020-08-08 DIAGNOSIS — K766 Portal hypertension: Secondary | ICD-10-CM | POA: Diagnosis present

## 2020-08-08 DIAGNOSIS — Z8249 Family history of ischemic heart disease and other diseases of the circulatory system: Secondary | ICD-10-CM

## 2020-08-08 DIAGNOSIS — E782 Mixed hyperlipidemia: Secondary | ICD-10-CM | POA: Diagnosis present

## 2020-08-08 DIAGNOSIS — I130 Hypertensive heart and chronic kidney disease with heart failure and stage 1 through stage 4 chronic kidney disease, or unspecified chronic kidney disease: Secondary | ICD-10-CM | POA: Diagnosis present

## 2020-08-08 DIAGNOSIS — Z7984 Long term (current) use of oral hypoglycemic drugs: Secondary | ICD-10-CM

## 2020-08-08 DIAGNOSIS — W19XXXA Unspecified fall, initial encounter: Secondary | ICD-10-CM

## 2020-08-08 DIAGNOSIS — R509 Fever, unspecified: Secondary | ICD-10-CM

## 2020-08-08 DIAGNOSIS — E039 Hypothyroidism, unspecified: Secondary | ICD-10-CM | POA: Diagnosis present

## 2020-08-08 DIAGNOSIS — N183 Chronic kidney disease, stage 3 unspecified: Secondary | ICD-10-CM | POA: Diagnosis present

## 2020-08-08 DIAGNOSIS — Z8052 Family history of malignant neoplasm of bladder: Secondary | ICD-10-CM

## 2020-08-08 LAB — BLOOD GAS, VENOUS
Acid-Base Excess: 0.3 mmol/L (ref 0.0–2.0)
Bicarbonate: 23.5 mmol/L (ref 20.0–28.0)
O2 Saturation: 55 %
Patient temperature: 98.6
pCO2, Ven: 34 mmHg — ABNORMAL LOW (ref 44.0–60.0)
pH, Ven: 7.453 — ABNORMAL HIGH (ref 7.250–7.430)
pO2, Ven: 32 mmHg (ref 32.0–45.0)

## 2020-08-08 LAB — CBC WITH DIFFERENTIAL/PLATELET
Abs Immature Granulocytes: 0.07 10*3/uL (ref 0.00–0.07)
Basophils Absolute: 0 10*3/uL (ref 0.0–0.1)
Basophils Relative: 0 %
Eosinophils Absolute: 0 10*3/uL (ref 0.0–0.5)
Eosinophils Relative: 0 %
HCT: 31.4 % — ABNORMAL LOW (ref 36.0–46.0)
Hemoglobin: 10.3 g/dL — ABNORMAL LOW (ref 12.0–15.0)
Immature Granulocytes: 1 %
Lymphocytes Relative: 2 %
Lymphs Abs: 0.3 10*3/uL — ABNORMAL LOW (ref 0.7–4.0)
MCH: 30.6 pg (ref 26.0–34.0)
MCHC: 32.8 g/dL (ref 30.0–36.0)
MCV: 93.2 fL (ref 80.0–100.0)
Monocytes Absolute: 0.9 10*3/uL (ref 0.1–1.0)
Monocytes Relative: 7 %
Neutro Abs: 11.9 10*3/uL — ABNORMAL HIGH (ref 1.7–7.7)
Neutrophils Relative %: 90 %
Platelets: 66 10*3/uL — ABNORMAL LOW (ref 150–400)
RBC: 3.37 MIL/uL — ABNORMAL LOW (ref 3.87–5.11)
RDW: 14.8 % (ref 11.5–15.5)
WBC: 13.2 10*3/uL — ABNORMAL HIGH (ref 4.0–10.5)
nRBC: 0 % (ref 0.0–0.2)

## 2020-08-08 LAB — CBG MONITORING, ED: Glucose-Capillary: 191 mg/dL — ABNORMAL HIGH (ref 70–99)

## 2020-08-08 LAB — COMPREHENSIVE METABOLIC PANEL
ALT: 26 U/L (ref 0–44)
AST: 56 U/L — ABNORMAL HIGH (ref 15–41)
Albumin: 3.3 g/dL — ABNORMAL LOW (ref 3.5–5.0)
Alkaline Phosphatase: 98 U/L (ref 38–126)
Anion gap: 15 (ref 5–15)
BUN: 16 mg/dL (ref 8–23)
CO2: 22 mmol/L (ref 22–32)
Calcium: 9.6 mg/dL (ref 8.9–10.3)
Chloride: 100 mmol/L (ref 98–111)
Creatinine, Ser: 1.5 mg/dL — ABNORMAL HIGH (ref 0.44–1.00)
GFR, Estimated: 34 mL/min — ABNORMAL LOW (ref >60)
Glucose, Bld: 194 mg/dL — ABNORMAL HIGH (ref 70–99)
Potassium: 4.1 mmol/L (ref 3.5–5.1)
Sodium: 137 mmol/L (ref 135–145)
Total Bilirubin: 2.7 mg/dL — ABNORMAL HIGH (ref 0.3–1.2)
Total Protein: 6.3 g/dL — ABNORMAL LOW (ref 6.5–8.1)

## 2020-08-08 LAB — IRON AND TIBC
Iron: 61 ug/dL (ref 41–142)
Saturation Ratios: 22 % (ref 21–57)
TIBC: 275 ug/dL (ref 236–444)
UIBC: 214 ug/dL (ref 120–384)

## 2020-08-08 LAB — RESP PANEL BY RT-PCR (FLU A&B, COVID) ARPGX2
Influenza A by PCR: NEGATIVE
Influenza B by PCR: NEGATIVE
SARS Coronavirus 2 by RT PCR: NEGATIVE

## 2020-08-08 LAB — LACTIC ACID, PLASMA: Lactic Acid, Venous: 5.9 mmol/L (ref 0.5–1.9)

## 2020-08-08 LAB — TSH: TSH: 0.895 u[IU]/mL (ref 0.350–4.500)

## 2020-08-08 LAB — PROTIME-INR
INR: 1.5 — ABNORMAL HIGH (ref 0.8–1.2)
Prothrombin Time: 17.7 seconds — ABNORMAL HIGH (ref 11.4–15.2)

## 2020-08-08 LAB — AMMONIA: Ammonia: 31 umol/L (ref 9–35)

## 2020-08-08 LAB — FERRITIN: Ferritin: 26 ng/mL (ref 11–307)

## 2020-08-08 LAB — BRAIN NATRIURETIC PEPTIDE: B Natriuretic Peptide: 85.1 pg/mL (ref 0.0–100.0)

## 2020-08-08 LAB — LIPASE, BLOOD: Lipase: 50 U/L (ref 11–51)

## 2020-08-08 MED ORDER — SODIUM CHLORIDE 0.9 % IV BOLUS
500.0000 mL | Freq: Once | INTRAVENOUS | Status: AC
  Administered 2020-08-08: 500 mL via INTRAVENOUS

## 2020-08-08 MED ORDER — SODIUM CHLORIDE 0.9 % IV SOLN
1.0000 g | Freq: Once | INTRAVENOUS | Status: AC
  Administered 2020-08-08: 1 g via INTRAVENOUS
  Filled 2020-08-08: qty 10

## 2020-08-08 MED ORDER — SODIUM CHLORIDE 0.9 % IV BOLUS
1000.0000 mL | Freq: Once | INTRAVENOUS | Status: AC
  Administered 2020-08-08: 1000 mL via INTRAVENOUS

## 2020-08-08 NOTE — ED Provider Notes (Signed)
  Provider Note MRN:  [id number]  Arrival date & time: 08/09/20    ED Course and Medical Decision Making  Assumed care from Dr. Sherry Ruffing at shift change.  Concern for urosepsis, receiving antibiotics, awaiting urinalysis, will need admission.  Urinalysis without obvious infection.  Adding on CT abdomen to further evaluate for source of fever.  Admitted to hospital service for further care.  .Critical Care  Date/Time: 08/09/2020 1:22 AM Performed by: Maudie Flakes, MD Authorized by: Maudie Flakes, MD   Critical care provider statement:    Critical care time (minutes):  38   Critical care was necessary to treat or prevent imminent or life-threatening deterioration of the following conditions:  Sepsis   Critical care was time spent personally by me on the following activities:  Discussions with consultants, evaluation of patient's response to treatment, examination of patient, ordering and performing treatments and interventions, ordering and review of laboratory studies, ordering and review of radiographic studies, pulse oximetry, re-evaluation of patient's condition, obtaining history from patient or surrogate and review of old charts   I assumed direction of critical care for this patient from another provider in my specialty: yes     Care discussed with: admitting provider    Final Clinical Impressions(s) / ED Diagnoses     ICD-10-CM   1. Fever, unspecified fever cause  R50.9     2. Dysuria  R30.0     3. Lactic acidosis  E87.2     4. Sepsis, due to unspecified organism, unspecified whether acute organ dysfunction present [facility])  A41.9       ED Discharge Orders     None       Discharge Instructions   None     Barth Kirks. [place] Small, [place] [email or web address]    Maudie Flakes, MD 08/09/20 [phone]

## 2020-08-08 NOTE — ED Notes (Addendum)
Difficulty obtaining 2nd lactic. Patient now has 2 IVS in place and there is no blood return noted from either. X 3 straight sticks attempted and still no blood has been obtained

## 2020-08-08 NOTE — ED Notes (Signed)
Attempted blood draw for lactic acid x2 Unsuccessful RN Solmon Ice made aware

## 2020-08-08 NOTE — ED Triage Notes (Signed)
BIBA Per EMS: Pt coming [facility] after being found on the bathroom floor. Pt stated she did not fall but felt dizzy so she sat herself down onto floor. Pt possible UTI. 100.5 temp; 22 RR; 100/50 BP; 100 HR.

## 2020-08-08 NOTE — ED Provider Notes (Addendum)
[facility] DEPT Provider Note   CSN: [id number] Arrival date & time: 08/08/20  1730     History No chief complaint on file.   Jessica Dennis is a 87 y.o. female.  The history is provided by the patient and medical records. No language interpreter was used.  Urinary Frequency This is a new problem. The current episode started more than 2 days ago. The problem occurs constantly. The problem has not changed since onset.Pertinent negatives include no chest pain, no abdominal pain, no headaches and no shortness of breath. Nothing aggravates the symptoms. Nothing relieves the symptoms. She has tried nothing for the symptoms. The treatment provided no relief.      Past Medical History:  Diagnosis Date   Anemia    Anemia of chronic renal failure, stage 3 (moderate) ([place]) 03/24/2020   Angiodysplasia of ascending colon 10/25/2014   Anxiety    Arthritis    Borderline diabetes    Cancer of the skin, basal cell 09/03/2012   Cirrhosis ([place])    Diabetes mellitus without complication ([place])    Diverticular disease    GAVE (gastric antral vascular ectasia) 09/09/2017   GERD (gastroesophageal reflux disease)    Hypertension    Iron deficiency anemia due to chronic blood loss 01/19/2015   Portal hypertensive gastropathy ([place]) 08/02/2016   ? Some GAVE also   Thyroid disease     Patient Active Problem List   Diagnosis Date Noted   Cognitive impairment 07/08/2020   Anemia in chronic kidney disease 05/18/2020   Bilirubinemia 05/13/2020   Hypokalemia 05/04/2020   CKD (chronic kidney disease) stage 3, GFR 30-59 ml/min (HCC) 03/24/2020   Hyponatremia 03/15/2020   Diabetic neuropathy ([place]) 02/29/2020   Pure hypercholesterolemia 02/29/2020   Thyroid disease    Hypertension    Cirrhosis of liver ([place])    Borderline diabetes    Chronic lower back pain    Anemia    Pancreatic cyst 02/01/2020   Pleural effusion on right 01/13/2020   Chronic diastolic (congestive)  heart failure ([place]) 01/13/2020   Lightheadedness 11/27/2019   Nonspecific abnormal electrocardiogram (ECG) (EKG) 11/27/2019   Abnormal electrocardiogram 11/27/2019   Bilateral leg edema 11/27/2019   Primary hypertension 11/27/2019   Chronic atrial fibrillation ([place]) 11/09/2019   Shortness of breath 11/09/2019   Multiple falls    Hyperglycemia due to type 2 diabetes mellitus ([place]) 10/27/2018   Generalized anxiety disorder 10/27/2018   Open wound, lower leg, left, initial encounter 10/27/2018   Tremor of right hand 10/27/2018   GAVE (gastric antral vascular ectasia) 59/93/5701   Alcoholic cirrhosis of liver with ascites ([place]) 08/02/2016   Portal hypertensive gastropathy ([place]) 08/02/2016   Iron deficiency anemia due to chronic blood loss 01/19/2015   Occult GI bleeding 01/19/2015   Angiodysplasia of ascending colon 10/25/2014   H/O bone density study 04/27/2013   Type 2 diabetes mellitus with hyperglycemia, with long-term current use of insulin ([place]) 03/24/2013   Vaginal dryness, menopausal 01/20/2013   Atrophic vaginitis 01/20/2013   S/P hysterectomy 01/20/2013   Cervical nerve root disorder 11/18/2012   Shoulder weakness 11/18/2012   Cancer of the skin, basal cell 09/03/2012   Squamous cell skin cancer 09/03/2012   Thrombocytopenia ([place]) 08/14/2012   Lymphadenopathy of right cervical region 04/01/2012   Hyperglycemia 09/19/2011   Heel pain 08/06/2011   Plantar fasciitis 08/06/2011   Arthritis of knee, degenerative 08/06/2011   Gonalgia 08/06/2011   History of anemia 06/28/2011   Anxiety 06/28/2011   Essential  hypertension, benign 06/28/2011   GERD (gastroesophageal reflux disease) 06/28/2011   Hyperlipidemia LDL goal <100 06/28/2011   History of hysterectomy 06/28/2011   Menopause 06/28/2011   Glaucoma 06/28/2011   DJD (degenerative joint disease) 06/28/2011   Essential hypertension 04/27/2010   GERD without esophagitis 10/28/2009   Anemia, unspecified 05/24/2009    Hypothyroidism 11/22/2008   Adult hypothyroidism 11/22/2008   Diverticulosis of colon 05/15/2007   Elevated fasting blood sugar 05/15/2007   Other psoriasis 05/15/2007   Symptomatic menopausal or female climacteric states 05/15/2007   Impaired fasting glucose 05/15/2007    Past Surgical History:  Procedure Laterality Date   ABDOMINAL HYSTERECTOMY     APPENDECTOMY     COLONOSCOPY     ESOPHAGOGASTRODUODENOSCOPY     ESOPHAGOGASTRODUODENOSCOPY (EGD) WITH PROPOFOL N/A 02/22/2020   Procedure: ESOPHAGOGASTRODUODENOSCOPY (EGD) WITH PROPOFOL;  Surgeon: Gatha Mayer, MD;  Location: [facility];  Service: Endoscopy;  Laterality: N/A;   IR PARACENTESIS  05/25/2016   IR THORACENTESIS ASP PLEURAL SPACE W/IMG GUIDE  02/23/2020   LEG SKIN LESION  BIOPSY / EXCISION  12/11/14   MOHS SURGERY     ankle   TONSILLECTOMY     TOTAL HIP ARTHROPLASTY Bilateral 1993, 2006   UPPER GASTROINTESTINAL ENDOSCOPY  09/09/2017     OB History     Gravida  2   Para  2   Term      Preterm      AB      Living  2      SAB      IAB      Ectopic      Multiple      Live Births              Family History  Problem Relation Age of Onset   Bladder Cancer Father    Heart attack Father    Diabetes Mother    Colon cancer Neg Hx    Colon polyps Neg Hx    Esophageal cancer Neg Hx    Rectal cancer Neg Hx    Stomach cancer Neg Hx     Social History   Tobacco Use   Smoking status: Never   Smokeless tobacco: Never  Vaping Use   Vaping Use: Never used  Substance Use Topics   Alcohol use: Yes    Comment: weekly   Drug use: No    Home Medications Prior to Admission medications   Medication Sig Start Date End Date Taking? Authorizing Provider  Accu-Chek FastClix Lancets MISC CHECK FOR BLOOD SUGAR DAILY 11/12/18   Carollee Herter, Alferd Apa, DO  acetaminophen (TYLENOL) 325 MG tablet Take 650 mg by mouth every 6 (six) hours as needed.    [provider]  Ascorbic Acid (VITAMIN C) 1000 MG  tablet Take 1,000 mg by mouth at bedtime.    [provider]  folic acid (FOLVITE) 1 MG tablet Take 1 tablet (1 mg total) by mouth daily. 01/16/20   Hosie Poisson, MD  glucose blood test strip 1 each by Other route 2 (two) times daily. Use as instructed-Check blood glucose prior to administering insulin    [provider]  insulin aspart (NOVOLOG) 100 UNIT/ML injection Inject 10 Units into the skin 3 (three) times daily. CBG more then 100    [provider]  insulin glargine (LANTUS) 100 UNIT/ML injection Inject 55 Units into the skin every morning.    [provider]  insulin glargine (LANTUS) 100 UNIT/ML injection Inject 30 Units  into the skin at bedtime.    [provider]  lactulose (CHRONULAC) 10 GM/15ML solution Take by mouth daily.    [provider]  latanoprost (XALATAN) 0.005 % ophthalmic solution Place 1 drop into both eyes at bedtime.  08/19/12   [provider]  levothyroxine (SYNTHROID) 75 MCG tablet Take 1 tablet (75 mcg total) by mouth daily. 11/09/19   Roma Schanz R, DO  LORazepam (ATIVAN) 0.5 MG tablet Take 1 tablet (0.5 mg total) by mouth at bedtime. 05/02/20   Virgie Dad, MD  metFORMIN (GLUCOPHAGE-XR) 500 MG 24 hr tablet Take 1 tablet (500 mg total) by mouth daily. 11/02/19   Ann Held, DO  metoprolol succinate (TOPROL-XL) 25 MG 24 hr tablet Take 12.5 mg by mouth daily.    [provider]  nystatin (MYCOSTATIN/NYSTOP) powder Apply 1 application topically 2 (two) times daily.    [provider]  pantoprazole (PROTONIX) 40 MG tablet Take 1 tablet (40 mg total) by mouth daily. 07/24/19   Gatha Mayer, MD  potassium chloride SA (KLOR-CON) 20 MEQ tablet Take 20 mEq by mouth daily.    [provider]  simvastatin (ZOCOR) 20 MG tablet TAKE ONE TABLET BY MOUTH EVERY NIGHT AT BEDTIME 12/14/19   Carollee Herter, Yvonne R, DO  sitaGLIPtin (JANUVIA) 50 MG tablet Take 50 mg by mouth  daily.    [provider]  spironolactone (ALDACTONE) 50 MG tablet Take 1 tablet (50 mg total) by mouth daily. 02/02/20   Gatha Mayer, MD  torsemide (DEMADEX) 20 MG tablet Take 40 mg by mouth daily.    [provider]  vitamin E 180 MG (400 UNITS) capsule Take 400 Units by mouth every evening.    [provider]    Allergies    Tizanidine  Review of Systems   Review of Systems  Constitutional:  Positive for chills, fatigue and fever. Negative for diaphoresis.  HENT:  Negative for congestion.   Eyes:  Negative for visual disturbance.  Respiratory:  Negative for cough, chest tightness, shortness of breath and wheezing.   Cardiovascular:  Negative for chest pain, palpitations and leg swelling.  Gastrointestinal:  Negative for abdominal pain, constipation, diarrhea, nausea and vomiting.  Genitourinary:  Positive for frequency. Negative for dysuria and flank pain.  Musculoskeletal:  Negative for back pain, neck pain and neck stiffness.  Skin:  Negative for rash and wound.  Neurological:  Positive for light-headedness. Negative for seizures, weakness, numbness and headaches.  Psychiatric/Behavioral:  Positive for confusion. Negative for agitation.   All other systems reviewed and are negative.  Physical Exam Updated Vital Signs BP 101/72   Pulse 98   Temp (!) 101.8 F (38.8 C) (Rectal)   Resp 20   Ht 5\' 6"  (1.676 m)   Wt 83.9 kg   SpO2 93%   BMI 29.86 kg/m   Physical Exam Vitals and nursing note reviewed.  Constitutional:      General: She is not in acute distress.    Appearance: She is well-developed. She is not ill-appearing, toxic-appearing or diaphoretic.  HENT:     Head: Normocephalic and atraumatic.     Mouth/Throat:     Mouth: Mucous membranes are moist.     Pharynx: No oropharyngeal exudate or posterior oropharyngeal erythema.  Eyes:     Extraocular Movements: Extraocular movements intact.     Conjunctiva/sclera: Conjunctivae normal.      Pupils: Pupils are equal, round, and reactive to light.  Cardiovascular:  Rate and Rhythm: Regular rhythm. Tachycardia present.     Heart sounds: No murmur heard. Pulmonary:     Effort: Pulmonary effort is normal. No respiratory distress.     Breath sounds: Normal breath sounds. No wheezing, rhonchi or rales.  Chest:     Chest wall: No tenderness.  Abdominal:     General: Abdomen is flat. There is no distension.     Palpations: Abdomen is soft.     Tenderness: There is no abdominal tenderness. There is no right CVA tenderness, left CVA tenderness, guarding or rebound.  Musculoskeletal:        General: No tenderness or signs of injury.     Cervical back: Neck supple. No tenderness.  Skin:    General: Skin is warm and dry.     Capillary Refill: Capillary refill takes less than 2 seconds.     Findings: No erythema.  Neurological:     Mental Status: She is alert.     Sensory: No sensory deficit.     Motor: No weakness.  Psychiatric:        Mood and Affect: Mood normal.    ED Results / Procedures / Treatments   Labs (all labs ordered are listed, but only abnormal results are displayed) Labs Reviewed  CBC WITH DIFFERENTIAL/PLATELET - Abnormal; Notable for the following components:      Result Value   WBC 13.2 (*)    RBC 3.37 (*)    Hemoglobin 10.3 (*)    HCT 31.4 (*)    Platelets 66 (*)    Neutro Abs 11.9 (*)    Lymphs Abs 0.3 (*)    All other components within normal limits  COMPREHENSIVE METABOLIC PANEL - Abnormal; Notable for the following components:   Glucose, Bld 194 (*)    Creatinine, Ser 1.50 (*)    Total Protein 6.3 (*)    Albumin 3.3 (*)    AST 56 (*)    Total Bilirubin 2.7 (*)    GFR, Estimated 34 (*)    All other components within normal limits  LACTIC ACID, PLASMA - Abnormal; Notable for the following components:   Lactic Acid, Venous 5.9 (*)    All other components within normal limits  BLOOD GAS, VENOUS - Abnormal; Notable for the following  components:   pH, Ven 7.453 (*)    pCO2, Ven 34.0 (*)    All other components within normal limits  PROTIME-INR - Abnormal; Notable for the following components:   Prothrombin Time 17.7 (*)    INR 1.5 (*)    All other components within normal limits  CBG MONITORING, ED - Abnormal; Notable for the following components:   Glucose-Capillary 191 (*)    All other components within normal limits  RESP PANEL BY RT-PCR (FLU A&B, COVID) ARPGX2  URINE CULTURE  CULTURE, BLOOD (ROUTINE X 2)  CULTURE, BLOOD (ROUTINE X 2)  LIPASE, BLOOD  AMMONIA  TSH  BRAIN NATRIURETIC PEPTIDE  LACTIC ACID, PLASMA  URINALYSIS, ROUTINE W REFLEX MICROSCOPIC    EKG EKG Interpretation  Date/Time:  Monday August 08 2020 17:44:29 EDT Ventricular Rate:  109 PR Interval:  47 QRS Duration: 87 QT Interval:  351 QTC Calculation: 471 R Axis:   34 Text Interpretation: Sinus tachycardia Low voltage, precordial leads Borderline ST depression, lateral leads ST elevation, consider inferior injury When compared to prior, faster rate. No STEMI Confirmed by Antony Blackbird [id number]) on 08/08/2020 6:58:55 PM  Radiology DG Chest Portable 1 View  Result Date: 08/08/2020  CLINICAL DATA:  Rhonchi fever EXAM: PORTABLE CHEST 1 VIEW COMPARISON:  02/23/2020 FINDINGS: The heart size and mediastinal contours are within normal limits. Aortic atherosclerosis. Both lungs are clear. The visualized skeletal structures are unremarkable. IMPRESSION: No active disease. Electronically Signed   By: Donavan Foil M.D.   On: 08/08/2020 18:33    Procedures Procedures   CRITICAL CARE Performed by: Gwenyth Allegra Sanoe Hazan Total critical care time: 35 minutes Critical care time was exclusive of separately billable procedures and treating other patients. Critical care was necessary to treat or prevent imminent or life-threatening deterioration. Critical care was time spent personally by me on the following activities: development of treatment plan with  patient and/or surrogate as well as nursing, discussions with consultants, evaluation of patient's response to treatment, examination of patient, obtaining history from patient or surrogate, ordering and performing treatments and interventions, ordering and review of laboratory studies, ordering and review of radiographic studies, pulse oximetry and re-evaluation of patient's condition.   Medications Ordered in ED Medications  cefTRIAXone (ROCEPHIN) 1 g in sodium chloride 0.9 % 100 mL IVPB (1 g Intravenous New Bag/Given 08/08/20 2334)  sodium chloride 0.9 % bolus 500 mL (0 mLs Intravenous Stopped 08/08/20 1901)  sodium chloride 0.9 % bolus 1,000 mL (0 mLs Intravenous Stopped 08/08/20 2307)    ED Course  I have reviewed the triage vital signs and the nursing notes.  Pertinent labs & imaging results that were available during my care of the patient were reviewed by me and considered in my medical decision making (see chart for details).    MDM Rules/Calculators/A&P                          Jessica Dennis is a 87 y.o. female with a past medical history significant for hypertension, hyperlipidemia, GERD, prior occult GI bleeds, diabetes, and cirrhosis with ascites who presents with fevers, chills, urinary frequency, dysuria, fatigue, and some confusion.  According to EMS report, patient has had fatigue for the last few days and was complaining of urinary frequency.  Patient reportedly is in the bathroom today and had to sit down when she was feeling lightheaded but not actually fall or lose consciousness.  She is denying any pain whatsoever and is denying any headache, neck pain, neck stiffness, abdominal pain, nausea, vomiting, constipation, or diarrhea.  Second urinary symptoms, she agrees that she is feeling tired, fatigued, and slightly confused.  She denies any focal numbness, tingling, or weakness of extremities.  She denies any trauma.  She denies any cough or congestion.  Denies any chest pain or  shortness of breath.  Does not feel that her abdomen is more distended or having any other abdominal complaints.   Per EMS, patient had a temperature of 102.9 earlier today and with that had a temperature of 100.5.  On exam, lungs have some coarseness but did not have significant wheezing.  Chest and abdomen are nontender.  Normal bowel sounds.  No focal neurologic deficits initially with normal sensation and strength in extremities.  Given the patient's urinary symptoms, fever, fatigue, confusion, and lightheadedness, I am concerned she may have symptomatic UTI.  We will get urinalysis but will also get work-up for other abnormalities.  With her coarse breath sounds, will get chest x-ray.  She was not hypoxic for me however.  We will give some fluids as she appears slightly dehydrated on mucous membranes and lungs did not sound wet initially.  We will also  get screening labs to look for hyperammonemia or other electrolyte imbalance with her history of ascites and confusion.  With lack of any abdominal tenderness or pain, low suspicion for SBP.  Given lack of any headache or neck pain or neck stiffness, doubt meningitis or other infectious CNS encephalitis at this time.  Anticipate reassessment after work-up to determine disposition.  10:58 PM Just reassessed patient.  She has still not been able to urinate.  Patient's work-up also began to return showing COVID and flu negative.  Ammonia not elevated.  LFTs not critically elevated and she does however have a white blood cell count of 13.2 and a lactic acid of 5.9.  With the significant elevated lactic acid, will order antibiotics for suspected UTI given her significant urinary symptoms and her vital signs.  We will wait for urinalysis but due to patient's vital signs, the reported mild altered mental status, and her lactic acidosis, she will need admission after work-up is completed.   11:36 PM Care transferred to oncoming team while waiting for  results of urinalysis and anticipate admission.   Final Clinical Impression(s) / ED Diagnoses Final diagnoses:  Fever, unspecified fever cause  Dysuria  Lactic acidosis    Clinical Impression: 1. Fever, unspecified fever cause   2. Dysuria   3. Lactic acidosis     Disposition: Care transferred while awaiting urinalysis to return.  Patient has received antibiotics for suspected UTI causing severe lactic acidosis and infection symptoms.  This note was prepared with assistance of Systems analyst. Occasional wrong-word or sound-a-like substitutions may have occurred due to the inherent limitations of voice recognition software.     Osker Ayoub, Gwenyth Allegra, MD 08/08/20 2338    Alane Hanssen, Gwenyth Allegra, MD 08/08/20 [phone]

## 2020-08-09 ENCOUNTER — Encounter (HOSPITAL_COMMUNITY): Payer: Self-pay | Admitting: Internal Medicine

## 2020-08-09 ENCOUNTER — Inpatient Hospital Stay (HOSPITAL_COMMUNITY): Payer: HMO

## 2020-08-09 DIAGNOSIS — I130 Hypertensive heart and chronic kidney disease with heart failure and stage 1 through stage 4 chronic kidney disease, or unspecified chronic kidney disease: Secondary | ICD-10-CM | POA: Diagnosis present

## 2020-08-09 DIAGNOSIS — E872 Acidosis, unspecified: Secondary | ICD-10-CM | POA: Diagnosis present

## 2020-08-09 DIAGNOSIS — R300 Dysuria: Secondary | ICD-10-CM | POA: Diagnosis present

## 2020-08-09 DIAGNOSIS — Y92009 Unspecified place in unspecified non-institutional (private) residence as the place of occurrence of the external cause: Secondary | ICD-10-CM

## 2020-08-09 DIAGNOSIS — D696 Thrombocytopenia, unspecified: Secondary | ICD-10-CM | POA: Diagnosis present

## 2020-08-09 DIAGNOSIS — E039 Hypothyroidism, unspecified: Secondary | ICD-10-CM

## 2020-08-09 DIAGNOSIS — E1169 Type 2 diabetes mellitus with other specified complication: Secondary | ICD-10-CM | POA: Diagnosis present

## 2020-08-09 DIAGNOSIS — W19XXXA Unspecified fall, initial encounter: Secondary | ICD-10-CM | POA: Diagnosis present

## 2020-08-09 DIAGNOSIS — A419 Sepsis, unspecified organism: Secondary | ICD-10-CM

## 2020-08-09 DIAGNOSIS — A4181 Sepsis due to Enterococcus: Secondary | ICD-10-CM | POA: Diagnosis present

## 2020-08-09 DIAGNOSIS — K219 Gastro-esophageal reflux disease without esophagitis: Secondary | ICD-10-CM

## 2020-08-09 DIAGNOSIS — K862 Cyst of pancreas: Secondary | ICD-10-CM

## 2020-08-09 DIAGNOSIS — I10 Essential (primary) hypertension: Secondary | ICD-10-CM | POA: Diagnosis not present

## 2020-08-09 DIAGNOSIS — Z20822 Contact with and (suspected) exposure to covid-19: Secondary | ICD-10-CM | POA: Diagnosis present

## 2020-08-09 DIAGNOSIS — I5032 Chronic diastolic (congestive) heart failure: Secondary | ICD-10-CM

## 2020-08-09 DIAGNOSIS — K7031 Alcoholic cirrhosis of liver with ascites: Secondary | ICD-10-CM

## 2020-08-09 DIAGNOSIS — Z90710 Acquired absence of both cervix and uterus: Secondary | ICD-10-CM | POA: Diagnosis not present

## 2020-08-09 DIAGNOSIS — Z96643 Presence of artificial hip joint, bilateral: Secondary | ICD-10-CM | POA: Diagnosis present

## 2020-08-09 DIAGNOSIS — E782 Mixed hyperlipidemia: Secondary | ICD-10-CM

## 2020-08-09 DIAGNOSIS — E869 Volume depletion, unspecified: Secondary | ICD-10-CM | POA: Diagnosis present

## 2020-08-09 DIAGNOSIS — N390 Urinary tract infection, site not specified: Secondary | ICD-10-CM | POA: Diagnosis present

## 2020-08-09 DIAGNOSIS — R7881 Bacteremia: Secondary | ICD-10-CM | POA: Diagnosis not present

## 2020-08-09 DIAGNOSIS — Z8052 Family history of malignant neoplasm of bladder: Secondary | ICD-10-CM | POA: Diagnosis not present

## 2020-08-09 DIAGNOSIS — K766 Portal hypertension: Secondary | ICD-10-CM | POA: Diagnosis present

## 2020-08-09 DIAGNOSIS — E1122 Type 2 diabetes mellitus with diabetic chronic kidney disease: Secondary | ICD-10-CM | POA: Diagnosis present

## 2020-08-09 DIAGNOSIS — N183 Chronic kidney disease, stage 3 unspecified: Secondary | ICD-10-CM | POA: Diagnosis present

## 2020-08-09 DIAGNOSIS — E1140 Type 2 diabetes mellitus with diabetic neuropathy, unspecified: Secondary | ICD-10-CM | POA: Diagnosis present

## 2020-08-09 DIAGNOSIS — G9341 Metabolic encephalopathy: Secondary | ICD-10-CM | POA: Diagnosis present

## 2020-08-09 DIAGNOSIS — Z66 Do not resuscitate: Secondary | ICD-10-CM | POA: Diagnosis present

## 2020-08-09 DIAGNOSIS — Z8249 Family history of ischemic heart disease and other diseases of the circulatory system: Secondary | ICD-10-CM | POA: Diagnosis not present

## 2020-08-09 LAB — COMPREHENSIVE METABOLIC PANEL
ALT: 43 U/L (ref 0–44)
AST: 234 U/L — ABNORMAL HIGH (ref 15–41)
Albumin: 3.1 g/dL — ABNORMAL LOW (ref 3.5–5.0)
Alkaline Phosphatase: 89 U/L (ref 38–126)
Anion gap: 12 (ref 5–15)
BUN: 20 mg/dL (ref 8–23)
CO2: 22 mmol/L (ref 22–32)
Calcium: 9.2 mg/dL (ref 8.9–10.3)
Chloride: 100 mmol/L (ref 98–111)
Creatinine, Ser: 1.24 mg/dL — ABNORMAL HIGH (ref 0.44–1.00)
GFR, Estimated: 43 mL/min — ABNORMAL LOW (ref >60)
Glucose, Bld: 206 mg/dL — ABNORMAL HIGH (ref 70–99)
Potassium: 4.2 mmol/L (ref 3.5–5.1)
Sodium: 134 mmol/L — ABNORMAL LOW (ref 135–145)
Total Bilirubin: 2.6 mg/dL — ABNORMAL HIGH (ref 0.3–1.2)
Total Protein: 5.9 g/dL — ABNORMAL LOW (ref 6.5–8.1)

## 2020-08-09 LAB — HEMOGLOBIN A1C
Hgb A1c MFr Bld: 6.4 % — ABNORMAL HIGH (ref 4.8–5.6)
Mean Plasma Glucose: 136.98 mg/dL

## 2020-08-09 LAB — CBC WITH DIFFERENTIAL/PLATELET
Abs Immature Granulocytes: 0.05 10*3/uL (ref 0.00–0.07)
Basophils Absolute: 0 10*3/uL (ref 0.0–0.1)
Basophils Relative: 0 %
Eosinophils Absolute: 0 10*3/uL (ref 0.0–0.5)
Eosinophils Relative: 0 %
HCT: 29.6 % — ABNORMAL LOW (ref 36.0–46.0)
Hemoglobin: 9.7 g/dL — ABNORMAL LOW (ref 12.0–15.0)
Immature Granulocytes: 1 %
Lymphocytes Relative: 4 %
Lymphs Abs: 0.3 10*3/uL — ABNORMAL LOW (ref 0.7–4.0)
MCH: 30.6 pg (ref 26.0–34.0)
MCHC: 32.8 g/dL (ref 30.0–36.0)
MCV: 93.4 fL (ref 80.0–100.0)
Monocytes Absolute: 0.4 10*3/uL (ref 0.1–1.0)
Monocytes Relative: 5 %
Neutro Abs: 7.8 10*3/uL — ABNORMAL HIGH (ref 1.7–7.7)
Neutrophils Relative %: 90 %
Platelets: 52 10*3/uL — ABNORMAL LOW (ref 150–400)
RBC: 3.17 MIL/uL — ABNORMAL LOW (ref 3.87–5.11)
RDW: 15.1 % (ref 11.5–15.5)
WBC: 8.6 10*3/uL (ref 4.0–10.5)
nRBC: 0 % (ref 0.0–0.2)

## 2020-08-09 LAB — BLOOD CULTURE ID PANEL (REFLEXED) - BCID2

## 2020-08-09 LAB — URINALYSIS, ROUTINE W REFLEX MICROSCOPIC
Bilirubin Urine: NEGATIVE
Glucose, UA: NEGATIVE mg/dL
Ketones, ur: NEGATIVE mg/dL
Nitrite: NEGATIVE
Protein, ur: NEGATIVE mg/dL
Specific Gravity, Urine: 1.01 (ref 1.005–1.030)
pH: 5 (ref 5.0–8.0)

## 2020-08-09 LAB — CORTISOL-AM, BLOOD: Cortisol - AM: 52.8 ug/dL — ABNORMAL HIGH (ref 6.7–22.6)

## 2020-08-09 LAB — GLUCOSE, CAPILLARY
Glucose-Capillary: 235 mg/dL — ABNORMAL HIGH (ref 70–99)
Glucose-Capillary: 217 mg/dL — ABNORMAL HIGH (ref 70–99)

## 2020-08-09 LAB — PROCALCITONIN: Procalcitonin: 0.91 ng/mL

## 2020-08-09 LAB — PROTIME-INR
INR: 1.4 — ABNORMAL HIGH (ref 0.8–1.2)
Prothrombin Time: 17.3 seconds — ABNORMAL HIGH (ref 11.4–15.2)

## 2020-08-09 LAB — LACTIC ACID, PLASMA
Lactic Acid, Venous: 2.7 mmol/L (ref 0.5–1.9)
Lactic Acid, Venous: 5 mmol/L (ref 0.5–1.9)
Lactic Acid, Venous: 3.9 mmol/L (ref 0.5–1.9)
Lactic Acid, Venous: 2.4 mmol/L (ref 0.5–1.9)

## 2020-08-09 LAB — CBG MONITORING, ED
Glucose-Capillary: 212 mg/dL — ABNORMAL HIGH (ref 70–99)
Glucose-Capillary: 240 mg/dL — ABNORMAL HIGH (ref 70–99)
Glucose-Capillary: 236 mg/dL — ABNORMAL HIGH (ref 70–99)

## 2020-08-09 LAB — MAGNESIUM: Magnesium: 1.9 mg/dL (ref 1.7–2.4)

## 2020-08-09 MED ORDER — INSULIN ASPART 100 UNIT/ML IJ SOLN
0.0000 [IU] | Freq: Three times a day (TID) | INTRAMUSCULAR | Status: DC
  Administered 2020-08-09 – 2020-08-10 (×4): 2 [IU] via SUBCUTANEOUS
  Administered 2020-08-10: 3 [IU] via SUBCUTANEOUS
  Administered 2020-08-10 (×2): 4 [IU] via SUBCUTANEOUS
  Administered 2020-08-11: 2 [IU] via SUBCUTANEOUS
  Administered 2020-08-11 (×2): 3 [IU] via SUBCUTANEOUS
  Administered 2020-08-11 – 2020-08-12 (×2): 2 [IU] via SUBCUTANEOUS
  Administered 2020-08-12: 3 [IU] via SUBCUTANEOUS
  Administered 2020-08-12: 2 [IU] via SUBCUTANEOUS
  Administered 2020-08-13 (×2): 3 [IU] via SUBCUTANEOUS
  Administered 2020-08-13: 2 [IU] via SUBCUTANEOUS
  Administered 2020-08-13: 1 [IU] via SUBCUTANEOUS
  Administered 2020-08-13: 3 [IU] via SUBCUTANEOUS
  Administered 2020-08-14 (×2): 1 [IU] via SUBCUTANEOUS
  Administered 2020-08-14: 2 [IU] via SUBCUTANEOUS
  Administered 2020-08-14 – 2020-08-15 (×2): 3 [IU] via SUBCUTANEOUS
  Administered 2020-08-15 (×2): 1 [IU] via SUBCUTANEOUS
  Filled 2020-08-09: qty 0.06

## 2020-08-09 MED ORDER — SODIUM CHLORIDE 0.9 % IV SOLN: INTRAVENOUS | Status: DC

## 2020-08-09 MED ORDER — INSULIN GLARGINE 100 UNIT/ML ~~LOC~~ SOLN
15.0000 [IU] | Freq: Every day | SUBCUTANEOUS | Status: DC
  Administered 2020-08-09: 15 [IU] via SUBCUTANEOUS
  Filled 2020-08-09 (×3): qty 0.15

## 2020-08-09 MED ORDER — SODIUM CHLORIDE 0.9 % IV SOLN: INTRAVENOUS | Status: AC

## 2020-08-09 MED ORDER — SPIRONOLACTONE 25 MG PO TABS
50.0000 mg | ORAL_TABLET | Freq: Every day | ORAL | Status: DC
  Filled 2020-08-09: qty 2

## 2020-08-09 MED ORDER — IOHEXOL 350 MG/ML SOLN
100.0000 mL | Freq: Once | INTRAVENOUS | Status: AC | PRN
  Administered 2020-08-09: 60 mL via INTRAVENOUS

## 2020-08-09 MED ORDER — LACTULOSE 10 GM/15ML PO SOLN
20.0000 g | Freq: Every day | ORAL | Status: DC
  Administered 2020-08-09 – 2020-08-15 (×7): 20 g via ORAL
  Filled 2020-08-09 (×7): qty 30

## 2020-08-09 MED ORDER — LORAZEPAM 0.5 MG PO TABS: 0.5000 mg | ORAL_TABLET | Freq: Every day | ORAL | Status: DC

## 2020-08-09 MED ORDER — ONDANSETRON HCL 4 MG/2ML IJ SOLN: 4.0000 mg | Freq: Four times a day (QID) | INTRAMUSCULAR | Status: DC | PRN

## 2020-08-09 MED ORDER — ONDANSETRON HCL 4 MG PO TABS: 4.0000 mg | ORAL_TABLET | Freq: Four times a day (QID) | ORAL | Status: DC | PRN

## 2020-08-09 MED ORDER — SODIUM CHLORIDE 0.9 % IV SOLN
1.0000 g | INTRAVENOUS | Status: DC
  Filled 2020-08-09: qty 10

## 2020-08-09 MED ORDER — LEVOTHYROXINE SODIUM 75 MCG PO TABS
75.0000 ug | ORAL_TABLET | Freq: Every day | ORAL | Status: DC
  Administered 2020-08-09 – 2020-08-15 (×7): 75 ug via ORAL
  Filled 2020-08-09 (×7): qty 1

## 2020-08-09 MED ORDER — SODIUM CHLORIDE 0.9 % IV SOLN
2.0000 g | INTRAVENOUS | Status: DC
  Administered 2020-08-09 – 2020-08-10 (×2): 2 g via INTRAVENOUS
  Filled 2020-08-09 (×2): qty 2

## 2020-08-09 MED ORDER — TORSEMIDE 20 MG PO TABS
40.0000 mg | ORAL_TABLET | Freq: Every day | ORAL | Status: DC
  Filled 2020-08-09: qty 2

## 2020-08-09 MED ORDER — ACETAMINOPHEN 650 MG RE SUPP: 650.0000 mg | Freq: Four times a day (QID) | RECTAL | Status: DC | PRN

## 2020-08-09 MED ORDER — INSULIN GLARGINE 100 UNIT/ML ~~LOC~~ SOLN: 30.0000 [IU] | Freq: Every day | SUBCUTANEOUS | Status: DC

## 2020-08-09 MED ORDER — PANTOPRAZOLE SODIUM 40 MG PO TBEC
40.0000 mg | DELAYED_RELEASE_TABLET | Freq: Every day | ORAL | Status: DC
  Administered 2020-08-09 – 2020-08-15 (×7): 40 mg via ORAL
  Filled 2020-08-09 (×7): qty 1

## 2020-08-09 MED ORDER — INSULIN GLARGINE 100 UNIT/ML ~~LOC~~ SOLN
25.0000 [IU] | Freq: Every morning | SUBCUTANEOUS | Status: DC
  Filled 2020-08-09: qty 0.25

## 2020-08-09 MED ORDER — METOPROLOL SUCCINATE ER 25 MG PO TB24
12.5000 mg | ORAL_TABLET | Freq: Every day | ORAL | Status: DC
  Administered 2020-08-09 – 2020-08-15 (×7): 12.5 mg via ORAL
  Filled 2020-08-09 (×5): qty 1
  Filled 2020-08-09: qty 0.5
  Filled 2020-08-09: qty 1

## 2020-08-09 MED ORDER — SODIUM CHLORIDE (PF) 0.9 % IJ SOLN
INTRAMUSCULAR | Status: AC
  Filled 2020-08-09: qty 50

## 2020-08-09 MED ORDER — SODIUM CHLORIDE 0.9 % IV SOLN: 1.0000 g | Freq: Two times a day (BID) | INTRAVENOUS | Status: DC

## 2020-08-09 MED ORDER — ENOXAPARIN SODIUM 30 MG/0.3ML IJ SOSY
30.0000 mg | PREFILLED_SYRINGE | INTRAMUSCULAR | Status: DC
  Administered 2020-08-09 – 2020-08-12 (×4): 30 mg via SUBCUTANEOUS
  Filled 2020-08-09 (×4): qty 0.3

## 2020-08-09 MED ORDER — LATANOPROST 0.005 % OP SOLN
1.0000 [drp] | Freq: Every day | OPHTHALMIC | Status: DC
  Administered 2020-08-09 – 2020-08-15 (×7): 1 [drp] via OPHTHALMIC
  Filled 2020-08-09: qty 2.5

## 2020-08-09 MED ORDER — INSULIN GLARGINE 100 UNIT/ML ~~LOC~~ SOLN
5.0000 [IU] | Freq: Every morning | SUBCUTANEOUS | Status: DC
  Administered 2020-08-09 – 2020-08-10 (×2): 5 [IU] via SUBCUTANEOUS
  Filled 2020-08-09 (×2): qty 0.05

## 2020-08-09 MED ORDER — INSULIN GLARGINE 100 UNIT/ML ~~LOC~~ SOLN: 55.0000 [IU] | Freq: Every morning | SUBCUTANEOUS | Status: DC

## 2020-08-09 MED ORDER — SIMVASTATIN 20 MG PO TABS
20.0000 mg | ORAL_TABLET | Freq: Every day | ORAL | Status: DC
  Administered 2020-08-09 – 2020-08-15 (×7): 20 mg via ORAL
  Filled 2020-08-09 (×7): qty 1

## 2020-08-09 MED ORDER — ACETAMINOPHEN 325 MG PO TABS
650.0000 mg | ORAL_TABLET | Freq: Four times a day (QID) | ORAL | Status: DC | PRN
  Administered 2020-08-09: 650 mg via ORAL
  Filled 2020-08-09: qty 2

## 2020-08-09 NOTE — Plan of Care (Signed)
Pt admission from emergency department. Pt arrived to 1402 alert and oriented x 2. Pt family member Sharee Pimple) called for admission assessment. Pt son Marcello Moores) called and voicemail left notifying of patient arrival to 66. Pt VSS on arrival skin intact with exception of generalized ecchymosis and small skin tear to R inner foot at base of great toe. Pt IV fluids continued. Pt safety maintained.    Problem: Education: Goal: Knowledge of General Education information will improve Description: Including pain rating scale, medication(s)/side effects and non-pharmacologic comfort measures Outcome: Progressing   Problem: Health Behavior/Discharge Planning: Goal: Ability to manage health-related needs will improve Outcome: Progressing   Problem: Clinical Measurements: Goal: Ability to maintain clinical measurements within normal limits will improve Outcome: Progressing Goal: Will remain free from infection Outcome: Progressing Goal: Diagnostic test results will improve Outcome: Progressing Goal: Respiratory complications will improve Outcome: Progressing Goal: Cardiovascular complication will be avoided Outcome: Progressing   Problem: Activity: Goal: Risk for activity intolerance will decrease Outcome: Progressing   Problem: Nutrition: Goal: Adequate nutrition will be maintained Outcome: Progressing   Problem: Coping: Goal: Level of anxiety will decrease Outcome: Progressing   Problem: Elimination: Goal: Will not experience complications related to bowel motility Outcome: Progressing Goal: Will not experience complications related to urinary retention Outcome: Progressing   Problem: Pain Managment: Goal: General experience of comfort will improve Outcome: Progressing   Problem: Safety: Goal: Ability to remain free from injury will improve Outcome: Progressing   Problem: Skin Integrity: Goal: Risk for impaired skin integrity will decrease Outcome: Progressing

## 2020-08-09 NOTE — ED Notes (Signed)
Messaged Dr. Rodena Piety regarding clarification of pt's maintenance fluids order. Maintenance fluids started at 122mL/hr NS, and verified with Hospitalist.

## 2020-08-09 NOTE — Progress Notes (Signed)
PT Cancellation Note  Patient Details Name: Jessica Dennis MRN: [id number] DOB: 1932-05-13   Cancelled Treatment:    Reason Eval/Treat Not Completed: Patient declined, no reason specified,recently arrived to floor from ED. Does not want to mobilize at this time. Check back tomorrow.   Claretha Cooper 08/09/2020, 3:45 PM   [place] Pager [phone] Office [phone]

## 2020-08-09 NOTE — ED Notes (Signed)
Pt sleeping with O2 sats intermittently dropping to 88% on room air. 2L nasal cannula oxygen applied with pt's O2 sats at 97%.

## 2020-08-09 NOTE — Progress Notes (Signed)
PHARMACY - PHYSICIAN COMMUNICATION CRITICAL VALUE ALERT - BLOOD CULTURE IDENTIFICATION (BCID)  Jessica Dennis is an 88 y.o. female who presented to [facility] on 08/08/2020 with a chief complaint of s/p fall  Assessment:  UTI    Name of physician (or Provider) Contacted: Gershon Cull NP   Current antibiotics: ceftriaxone  Changes to prescribed antibiotics recommended:  Likely contaminant, but will inc dose to ceftiaxone 2 gr IV daily to cover since pt already on med.   Results for orders placed or performed during the hospital encounter of 08/08/20  Blood Culture ID Panel (Reflexed) (Collected: 08/08/2020  5:50 PM)  Result Value Ref Range   Enterococcus faecalis NOT DETECTED NOT DETECTED   Enterococcus Faecium NOT DETECTED NOT DETECTED   Listeria monocytogenes NOT DETECTED NOT DETECTED   Staphylococcus species NOT DETECTED NOT DETECTED   Staphylococcus aureus (BCID) NOT DETECTED NOT DETECTED   Staphylococcus epidermidis NOT DETECTED NOT DETECTED   Staphylococcus lugdunensis NOT DETECTED NOT DETECTED   Streptococcus species DETECTED (A) NOT DETECTED   Streptococcus agalactiae NOT DETECTED NOT DETECTED   Streptococcus pneumoniae NOT DETECTED NOT DETECTED   Streptococcus pyogenes NOT DETECTED NOT DETECTED   A.calcoaceticus-baumannii NOT DETECTED NOT DETECTED   Bacteroides fragilis NOT DETECTED NOT DETECTED   Enterobacterales NOT DETECTED NOT DETECTED   Enterobacter cloacae complex NOT DETECTED NOT DETECTED   Escherichia coli NOT DETECTED NOT DETECTED   Klebsiella aerogenes NOT DETECTED NOT DETECTED   Klebsiella oxytoca NOT DETECTED NOT DETECTED   Klebsiella pneumoniae NOT DETECTED NOT DETECTED   Proteus species NOT DETECTED NOT DETECTED   Salmonella species NOT DETECTED NOT DETECTED   Serratia marcescens NOT DETECTED NOT DETECTED   Haemophilus influenzae NOT DETECTED NOT DETECTED   Neisseria meningitidis NOT DETECTED NOT DETECTED   Pseudomonas aeruginosa NOT DETECTED NOT  DETECTED   Stenotrophomonas maltophilia NOT DETECTED NOT DETECTED   Candida albicans NOT DETECTED NOT DETECTED   Candida auris NOT DETECTED NOT DETECTED   Candida glabrata NOT DETECTED NOT DETECTED   Candida krusei NOT DETECTED NOT DETECTED   Candida parapsilosis NOT DETECTED NOT DETECTED   Candida tropicalis NOT DETECTED NOT DETECTED   Cryptococcus neoformans/gattii NOT DETECTED NOT DETECTED     Royetta Asal, PharmD, BCPS 08/09/2020 7:17 PM

## 2020-08-09 NOTE — TOC Initial Note (Signed)
Transition of Care [facility]) - Initial/Assessment Note    Patient Details  Name: Jessica Dennis MRN: [id number] Date of Birth: September 12, 1932  Transition of Care [facility]) CM/SW Contact:    Dessa Phi, RN Phone Number: 08/09/2020, 3:28 PM  Clinical Narrative: Patient A+0 x2-no date, From [facility]-ALF-spoke to tr in law [name]-d/c plan likely need higher level. PT ordered await recc.                  Expected Discharge Plan: Skilled Nursing Facility Barriers to Discharge: Continued Medical Work up   Patient Goals and CMS Choice Patient states their goals for this hospitalization and ongoing recovery are:: go to rehab CMS Medicare.gov Compare Post Acute Care list provided to:: Patient Represenative (must comment) (dtr in law-[name]/son Marcello Moores) Choice offered to / list presented to : Adult Children  Expected Discharge Plan and Services Expected Discharge Plan: [place]   Discharge Planning Services: CM Consult Post Acute Care Choice: [place] Living arrangements for the past 2 months: [place]                                      Prior Living Arrangements/Services Living arrangements for the past 2 months: [employer] Lives with:: Facility Resident Patient language and need for interpreter reviewed:: Yes Do you feel safe going back to the place where you live?: Yes      Need for Family Participation in Patient Care: No (Comment) Care giver support system in place?: Yes (comment) Current home services: DME (rw) Criminal Activity/Legal Involvement Pertinent to Current Situation/Hospitalization: No - Comment as needed  Activities of Daily Living Home Assistive Devices/Equipment: Eyeglasses (reading glasses) ADL Screening (condition at time of admission) Patient's cognitive ability adequate to safely complete daily activities?: Yes Is the patient deaf or have difficulty hearing?: No Does the patient have  difficulty seeing, even when wearing glasses/contacts?: No Does the patient have difficulty concentrating, remembering, or making decisions?: No Patient able to express need for assistance with ADLs?: Yes Does the patient have difficulty dressing or bathing?: No Independently performs ADLs?: Yes (appropriate for developmental age) Does the patient have difficulty walking or climbing stairs?: No Weakness of Legs: None Weakness of Arms/Hands: None  Permission Sought/Granted Permission sought to share information with : Case Manager Permission granted to share information with : Yes, Verbal Permission Granted  Share Information with NAME: Case Manager     Permission granted to share info w Relationship: Adaliz Dobis [phone]     Emotional Assessment Appearance:: Appears stated age Attitude/Demeanor/Rapport: Gracious Affect (typically observed): Accepting Orientation: : Oriented to Self, Oriented to Place, Oriented to  Time Alcohol / Substance Use: Not Applicable Psych Involvement: No (comment)  Admission diagnosis:  Dysuria [R30.0] Lactic acidosis [E87.2] Sepsis ([place]) [A41.9] Fever, unspecified fever cause [R50.9] Sepsis, due to unspecified organism, unspecified whether acute organ dysfunction present [facility]) [A41.9] Patient Active Problem List   Diagnosis Date Noted   Sepsis secondary to UTI ([place]) 08/09/2020   Fall at home, initial encounter 08/09/2020   Lactic acidosis 08/09/2020   Mixed hyperlipidemia due to type 2 diabetes mellitus ([place]) 08/09/2020   Cognitive impairment 07/08/2020   Anemia in chronic kidney disease 05/18/2020   Bilirubinemia 05/13/2020   Hypokalemia 05/04/2020   CKD (chronic kidney disease) stage 3, GFR 30-59 ml/min (HCC) 03/24/2020   Hyponatremia 03/15/2020   Diabetic neuropathy ([place]) 02/29/2020  Pure hypercholesterolemia 02/29/2020   Thyroid disease    Hypertension    Cirrhosis of liver (HCC)    Borderline diabetes    Chronic lower back pain     Anemia    Pancreatic cyst 02/01/2020   Pleural effusion on right 01/13/2020   Chronic diastolic (congestive) heart failure ([place]) 01/13/2020   Lightheadedness 11/27/2019   Nonspecific abnormal electrocardiogram (ECG) (EKG) 11/27/2019   Abnormal electrocardiogram 11/27/2019   Bilateral leg edema 11/27/2019   Primary hypertension 11/27/2019   Chronic atrial fibrillation ([place]) 11/09/2019   Multiple falls    Hyperglycemia due to type 2 diabetes mellitus ([place]) 10/27/2018   Generalized anxiety disorder 10/27/2018   Open wound, lower leg, left, initial encounter 10/27/2018   Tremor of right hand 10/27/2018   GAVE (gastric antral vascular ectasia) 28/00/3491   Alcoholic cirrhosis of liver with ascites ([place]) 08/02/2016   Portal hypertensive gastropathy ([place]) 08/02/2016   Iron deficiency anemia due to chronic blood loss 01/19/2015   Occult GI bleeding 01/19/2015   Angiodysplasia of ascending colon 10/25/2014   H/O bone density study 04/27/2013   Type 2 diabetes mellitus with hyperglycemia, with long-term current use of insulin ([place]) 03/24/2013   Vaginal dryness, menopausal 01/20/2013   Atrophic vaginitis 01/20/2013   S/P hysterectomy 01/20/2013   Cervical nerve root disorder 11/18/2012   Shoulder weakness 11/18/2012   Cancer of the skin, basal cell 09/03/2012   Squamous cell skin cancer 09/03/2012   Thrombocytopenia ([place]) 08/14/2012   Lymphadenopathy of right cervical region 04/01/2012   Hyperglycemia 09/19/2011   Heel pain 08/06/2011   Plantar fasciitis 08/06/2011   Arthritis of knee, degenerative 08/06/2011   Gonalgia 08/06/2011   History of anemia 06/28/2011   Anxiety 06/28/2011   Essential hypertension, benign 06/28/2011   GERD (gastroesophageal reflux disease) 06/28/2011   Hyperlipidemia LDL goal <100 06/28/2011   History of hysterectomy 06/28/2011   Menopause 06/28/2011   Glaucoma 06/28/2011   DJD (degenerative joint disease) 06/28/2011   Essential hypertension 04/27/2010    GERD without esophagitis 10/28/2009   Anemia, unspecified 05/24/2009   Hypothyroidism 11/22/2008   Adult hypothyroidism 11/22/2008   Diverticulosis of colon 05/15/2007   Elevated fasting blood sugar 05/15/2007   Other psoriasis 05/15/2007   Symptomatic menopausal or female climacteric states 05/15/2007   Impaired fasting glucose 05/15/2007   PCP:  Virgie Dad, MD Pharmacy:   [facility] [id number] [place] [address] Phone: [phone] Fax: [place], [address] [place] [address] Phone: [phone] Fax: [phone]     Social Determinants of Health (SDOH) Interventions    Readmission Risk Interventions No flowsheet data found.

## 2020-08-09 NOTE — Progress Notes (Signed)
PHARMACY NOTE:  ANTIMICROBIAL RENAL DOSAGE ADJUSTMENT  Current antimicrobial regimen includes a mismatch between antimicrobial dosage and estimated renal function.  As per policy approved by the Pharmacy & Therapeutics and Medical Executive Committees, the antimicrobial dosage will be adjusted accordingly.  Current antimicrobial dosage:  Ceftriaxone 1gm IV q12h  Indication: UTI  Renal Function:  Estimated Creatinine Clearance: 29.9 mL/min (A) (by C-G formula based on SCr of 1.5 mg/dL (H)).     Antimicrobial dosage has been changed to:  Ceftriaxone 1gm IV q24h   Thank you for allowing pharmacy to be a part of this patient's care.  Everette Rank, [facility] 08/09/2020 4:41 AM

## 2020-08-09 NOTE — ED Notes (Signed)
Pt attached to cardiac monitor x3, with pure wick in place. There is a 22g IV to the right hand and a 20g IV to the right AC.  Pt is febrile with 100.68F temp and tachy with a HR of 101 bpm. BP is normal at this time. Pt resting with eyes closed and will awaken to voice. Alert and oriented to person and place. Hospitalist messaged regarding pt's abnormal VS. No additional orders at this time. Will continue to monitor. Last lactic obtained at 12AM this morning was 2.7. Hospitalist messaged regarding obtaining third lactic.

## 2020-08-09 NOTE — ED Notes (Signed)
Attempted to assist and encourage pt to eat meal breakfast. Pt refusing.

## 2020-08-09 NOTE — ED Notes (Signed)
Call son, Madaleine Simmon, [phone].

## 2020-08-09 NOTE — H&P (Signed)
History and Physical    Jessica Dennis JEH:[id number] DOB: 1932-10-29 DOA: 08/08/2020  PCP: Virgie Dad, MD  Patient coming from: [place]  Chief Complaint:   Fall  HPI:    87 year old female with past medical history of alcoholic liver cirrhosis, insulin-dependent diabetes mellitus type 2, diabetic neuropathy, diastolic congestive heart failure (Echo 11/2019 EF 60-65%), hypertension, hyperlipidemia, hypothyroidism, gastroesophageal reflux disease who presents to [facility] emergency department brought in by EMS from [facility] due to being found down on the bathroom floor.  Patient is a somewhat poor historian presumably secondary to acute infection.  Attempts have been made to contact the son and have been unsuccessful although he was present at the independent living facility at the time of EMS arrival.  Patient explains that she has not felt well the past several days.  She complains of generalized weakness and poor appetite.  Patient also complains of vague generalized abdominal discomfort.   Patient was found sitting on the floor in her bathroom by son and staff.  Patient was seemingly tired and lethargic and was noted to be febrile by the staff.  EMS was contacted and on upon arrival noted the patient had a fever of 102.23F.  Patient was also noted to be incontinent of urine.  Patient was then promptly brought into [facility] emergency department for evaluation.  Upon evaluation in the emergency department patient was noted to have recurrent fevers throughout the emergency department course.  Patient was noted to have multiple sirs criteria with clinical evidence of encephalopathy and suspected urinary tract infection all concerning for developing sepsis.  Urinalysis was only found to be slightly abnormal with rare bacteria trace leukocytes and 6-10 white blood cells per high-powered field.  Patient was  initiated on intravenous ceftriaxone for suspected complicated urinary tract infection.  CT imaging of the abdomen pelvis was performed which was unremarkable.  Due to concerns for urinary tract infection and sepsis the hospitalist group was then called to assess the patient for admission to the hospital.  Review of Systems:   Review of Systems  Unable to perform ROS: Mental status change   Past Medical History:  Diagnosis Date   Anemia    Anemia of chronic renal failure, stage 3 (moderate) ([place]) 03/24/2020   Angiodysplasia of ascending colon 10/25/2014   Anxiety    Arthritis    Borderline diabetes    Cancer of the skin, basal cell 09/03/2012   Cirrhosis ([place])    Diabetes mellitus without complication ([place])    Diverticular disease    GAVE (gastric antral vascular ectasia) 09/09/2017   GERD (gastroesophageal reflux disease)    Hypertension    Iron deficiency anemia due to chronic blood loss 01/19/2015   Portal hypertensive gastropathy ([place]) 08/02/2016   ? Some GAVE also   Thyroid disease     Past Surgical History:  Procedure Laterality Date   ABDOMINAL HYSTERECTOMY     APPENDECTOMY     COLONOSCOPY     ESOPHAGOGASTRODUODENOSCOPY     ESOPHAGOGASTRODUODENOSCOPY (EGD) WITH PROPOFOL N/A 02/22/2020   Procedure: ESOPHAGOGASTRODUODENOSCOPY (EGD) WITH PROPOFOL;  Surgeon: Gatha Mayer, MD;  Location: [facility] ENDOSCOPY;  Service: Endoscopy;  Laterality: N/A;   IR PARACENTESIS  05/25/2016   IR THORACENTESIS ASP PLEURAL SPACE W/IMG GUIDE  02/23/2020   LEG SKIN LESION  BIOPSY / EXCISION  12/11/14   MOHS SURGERY     ankle   TONSILLECTOMY     TOTAL HIP  ARTHROPLASTY Bilateral 1993, 2006   UPPER GASTROINTESTINAL ENDOSCOPY  09/09/2017     reports that she has never smoked. She has never used smokeless tobacco. She reports current alcohol use. She reports that she does not use drugs.  Allergies  Allergen Reactions   Tizanidine Other (See Comments)    Hallucinate, confused     Family History   Problem Relation Age of Onset   Bladder Cancer Father    Heart attack Father    Diabetes Mother    Colon cancer Neg Hx    Colon polyps Neg Hx    Esophageal cancer Neg Hx    Rectal cancer Neg Hx    Stomach cancer Neg Hx      Prior to Admission medications   Medication Sig Start Date End Date Taking? Authorizing Provider  Accu-Chek FastClix Lancets MISC CHECK FOR BLOOD SUGAR DAILY 11/12/18  Yes Roma Schanz R, DO  acetaminophen (TYLENOL) 325 MG tablet Take 650 mg by mouth every 6 (six) hours as needed.   Yes [provider]  Ascorbic Acid (VITAMIN C) 1000 MG tablet Take 1,000 mg by mouth at bedtime.   Yes [provider]  folic acid (FOLVITE) 1 MG tablet Take 1 tablet (1 mg total) by mouth daily. 01/16/20  Yes Hosie Poisson, MD  glucose blood test strip 1 each by Other route 2 (two) times daily. Use as instructed-Check blood glucose prior to administering insulin   Yes [provider]  insulin aspart (NOVOLOG) 100 UNIT/ML injection Inject 10 Units into the skin 3 (three) times daily. CBG more then 100   Yes [provider]  insulin glargine (LANTUS) 100 UNIT/ML injection Inject 55 Units into the skin every morning.   Yes [provider]  insulin glargine (LANTUS) 100 UNIT/ML injection Inject 30 Units into the skin at bedtime.   Yes [provider]  lactulose (CHRONULAC) 10 GM/15ML solution Take 20 g by mouth daily.   Yes [provider]  latanoprost (XALATAN) 0.005 % ophthalmic solution Place 1 drop into both eyes at bedtime.  08/19/12  Yes [provider]  levothyroxine (SYNTHROID) 75 MCG tablet Take 1 tablet (75 mcg total) by mouth daily. 11/09/19  Yes Roma Schanz R, DO  LORazepam (ATIVAN) 0.5 MG tablet Take 1 tablet (0.5 mg total) by mouth at bedtime. 05/02/20  Yes Virgie Dad, MD  metFORMIN (GLUCOPHAGE-XR) 500 MG 24 hr tablet Take 1 tablet (500 mg total) by mouth daily. 11/02/19  Yes Roma Schanz R, DO  metoprolol succinate (TOPROL-XL) 25 MG 24 hr tablet Take 12.5 mg by mouth daily.   Yes [provider]  pantoprazole (PROTONIX) 40 MG tablet Take 1 tablet (40 mg total) by mouth daily. 07/24/19  Yes Gatha Mayer, MD  potassium chloride SA (KLOR-CON) 20 MEQ tablet Take 20 mEq by mouth daily.   Yes [provider]  simvastatin (ZOCOR) 20 MG tablet TAKE ONE TABLET BY MOUTH EVERY NIGHT AT BEDTIME 12/14/19  Yes Roma Schanz R, DO  sitaGLIPtin (JANUVIA) 50 MG tablet Take 50 mg by mouth daily.   Yes [provider]  sitaGLIPtin (JANUVIA) 50 MG tablet Take 50 mg by mouth daily.   Yes [provider]  spironolactone (ALDACTONE) 50 MG tablet Take 1 tablet (50 mg total) by mouth daily. 02/02/20  Yes Gatha Mayer, MD  torsemide (DEMADEX) 20 MG tablet Take 40 mg by mouth daily.   Yes [provider]  vitamin E 180  MG (400 UNITS) capsule Take 400 Units by mouth every evening.   Yes [provider]  nystatin (MYCOSTATIN/NYSTOP) powder Apply 1 application topically 2 (two) times daily.    [provider]    Physical Exam: Vitals:   08/09/20 0000 08/09/20 0100 08/09/20 0342 08/09/20 0405  BP: 125/76 (!) 115/93  (!) 101/43  Pulse: 99 (!) 105  (!) 103  Resp: 16 19  (!) 22  Temp:   99.6 F (37.6 C)   TempSrc:   Oral   SpO2: 96% 98%  96%  Weight:      Height:        Constitutional: Lethargic arousable and oriented x1, patient is not in any acute distress Skin: Multiple ecchymoses noted over the arms and legs.   poor skin turgor noted. Eyes: Pupils are equally reactive to light.  No evidence of scleral icterus or conjunctival pallor.  ENMT: Moist mucous membranes noted.  Posterior pharynx clear of any exudate or lesions.   Neck: normal, supple, no masses, no thyromegaly.  No evidence of jugular venous distension.   Respiratory: clear to auscultation bilaterally, no wheezing, no crackles. Normal respiratory effort. No  accessory muscle use.  Cardiovascular: Tachycardic rate with regular rhythm.  No murmurs / rubs / gallops. No extremity edema. 2+ pedal pulses. No carotid bruits.  Chest:   Nontender without crepitus or deformity.   Back:   Nontender without crepitus or deformity. Abdomen: Mild generalized abdominal tenderness.  No evidence of intra-abdominal masses.  Positive bowel sounds noted in all quadrants.   Musculoskeletal: No joint deformity upper and lower extremities. Good ROM, no contractures. Normal muscle tone.  Neurologic: Lethargic but arousable and oriented x1.  Patient is intermittently following commands.  Patient is moved.  Sensation is grossly intact  Psychiatric: Unable to fully assess due to lethargy and confusion.  Patient currently does not seem to possess insight as to her current situation.  Labs on Admission: I have personally reviewed following labs and imaging studies -   CBC: Recent Labs  Lab 08/05/20 1045 08/08/20 1748  WBC 6.4 13.2*  NEUTROABS 4.9 11.9*  HGB 10.6* 10.3*  HCT 31.8* 31.4*  MCV 92.4 93.2  PLT 70* 66*   Basic Metabolic Panel: Recent Labs  Lab 08/05/20 1045 08/08/20 1748  NA 135 137  K 4.2 4.1  CL 97* 100  CO2 28 22  GLUCOSE 287* 194*  BUN 18 16  CREATININE 1.26* 1.50*  CALCIUM 9.5 9.6   GFR: Estimated Creatinine Clearance: 29.9 mL/min (A) (by C-G formula based on SCr of 1.5 mg/dL (H)). Liver Function Tests: Recent Labs  Lab 08/05/20 1045 08/08/20 1748  AST 31 56*  ALT 17 26  ALKPHOS 101 98  BILITOT 2.1* 2.7*  PROT 5.6* 6.3*  ALBUMIN 3.5 3.3*   Recent Labs  Lab 08/08/20 1748  LIPASE 50   Recent Labs  Lab 08/08/20 1750  AMMONIA 31   Coagulation Profile: Recent Labs  Lab 08/08/20 1748  INR 1.5*   Cardiac Enzymes: No results for input(s): CKTOTAL, CKMB, CKMBINDEX, TROPONINI in the last 168 hours. BNP (last 3 results) Recent Labs    01/12/20 1241  PROBNP 195.0*   HbA1C: No results for input(s): HGBA1C in the last 72  hours. CBG: Recent Labs  Lab 08/08/20 1807  GLUCAP 191*   Lipid Profile: No results for input(s): CHOL, HDL, LDLCALC, TRIG, CHOLHDL, LDLDIRECT in the last 72 hours. Thyroid Function Tests: Recent Labs    08/08/20 1750  TSH 0.895  Anemia Panel: No results for input(s): VITAMINB12, FOLATE, FERRITIN, TIBC, IRON, RETICCTPCT in the last 72 hours. Urine analysis:    Component Value Date/Time   COLORURINE YELLOW 08/09/2020 0019   APPEARANCEUR HAZY (A) 08/09/2020 0019   LABSPEC 1.010 08/09/2020 0019   PHURINE 5.0 08/09/2020 0019   GLUCOSEU NEGATIVE 08/09/2020 0019   GLUCOSEU NEGATIVE 06/02/2015 1050   HGBUR MODERATE (A) 08/09/2020 0019   BILIRUBINUR NEGATIVE 08/09/2020 0019   BILIRUBINUR neg 10/26/2016 1255   KETONESUR NEGATIVE 08/09/2020 0019   PROTEINUR NEGATIVE 08/09/2020 0019   UROBILINOGEN 0.2 10/26/2016 1255   UROBILINOGEN 0.2 06/02/2015 1050   NITRITE NEGATIVE 08/09/2020 0019   LEUKOCYTESUR TRACE (A) 08/09/2020 0019    Radiological Exams on Admission - Personally Reviewed: CT ABDOMEN PELVIS W CONTRAST  Result Date: 08/09/2020 CLINICAL DATA:  Abdominal pain EXAM: CT ABDOMEN AND PELVIS WITH CONTRAST TECHNIQUE: Multidetector CT imaging of the abdomen and pelvis was performed using the standard protocol following bolus administration of intravenous contrast. CONTRAST:  52mL OMNIPAQUE IOHEXOL 350 MG/ML SOLN COMPARISON:  01/12/2020 FINDINGS: LOWER CHEST: Right pleural effusion with basilar atelectasis HEPATOBILIARY: Diffusely nodular hepatic contours with relative hypertrophy of the caudate and left hepatic lobe, consistent with hepatic cirrhosis. No focal liver lesion. No biliary dilatation. Distended gallbladder PANCREAS: 12 mm cystic focus in the distal pancreatic body, slightly larger than on 01/12/2020. SPLEEN: Spleen is enlarged, measuring 16.7 cm in craniocaudal dimension. ADRENALS/URINARY TRACT: The adrenal glands are normal. No hydronephrosis, nephroureterolithiasis or  solid renal mass. The urinary bladder is normal for degree of distention STOMACH/BOWEL: There is no hiatal hernia. Normal duodenal course and caliber. No small bowel dilatation or inflammation. Rectosigmoid diverticulosis without acute inflammation. Normal appendix. VASCULAR/LYMPHATIC: There is calcific atherosclerosis of the abdominal aorta. No lymphadenopathy. Recanalized umbilical vein. REPRODUCTIVE: Status post hysterectomy. No adnexal mass. MUSCULOSKELETAL. Bilateral hip arthroplasties OTHER: None. IMPRESSION: 1. No acute abnormality of the abdomen or pelvis. 2. Hepatic cirrhosis with evidence of portal hypertension. 3. Right pleural effusion with basilar atelectasis. 4. 12 mm cystic focus in the distal pancreatic body. Recommend follow up pre and post contrast MRI/MRCP or pancreatic protocol CT in 2 years. This recommendation follows ACR consensus guidelines: Management of Incidental Pancreatic Cysts: A White Paper of the ACR Incidental Findings Committee. [place] [id number]. Aortic Atherosclerosis (ICD10-I70.0). Electronically Signed   By: Ulyses Jarred M.D.   On: 08/09/2020 03:50   DG Chest Portable 1 View  Result Date: 08/08/2020 CLINICAL DATA:  Rhonchi fever EXAM: PORTABLE CHEST 1 VIEW COMPARISON:  02/23/2020 FINDINGS: The heart size and mediastinal contours are within normal limits. Aortic atherosclerosis. Both lungs are clear. The visualized skeletal structures are unremarkable. IMPRESSION: No active disease. Electronically Signed   By: Donavan Foil M.D.   On: 08/08/2020 18:33    EKG: Personally reviewed.  Rhythm is sinus tachycardia with heart rate of 109 bpm.  No dynamic ST segment changes appreciated.  Assessment/Plan Principal Problem:   Sepsis secondary to UTI [facility])  Patient presenting with multiple SIRS criteria, in the setting of suspected urinary tract infection with evidence of concurrent encephalopathy and lactic acidosis all concerning for developing  sepsis Urinalysis is not particularly compelling for urinary tract infection however clinically I believe this is the most likely source of infection Patient continuing to have frequent fevers here in the emergency department Neck is supple and I feel that meningitis is unlikely Chest x-ray reveals no evidence of pneumonia COVID-19 testing negative Patient has received several isotonic fluid boluses here in  the emergency department Treated with intravenous ceftriaxone Blood and urine cultures obtained  Active Problems:  Acute metabolic encephalopathy  Felt to be secondary to sepsis Treating underlying infection Monitoring for resolution with improving infection Will expand work-up if encephalopathy fails to improve  Lactic acidosis  Thought to be secondary to volume depletion and infection Being with intravenous antibiotics, continuing intravenous fluids Performing serial lactic acid levels to ensure downtrending and resolution    Essential hypertension, benign  Continue home regimen metoprolol Conservative management considering concurrent infection    Hypothyroidism  Continue home regimen of Synthroid    Alcoholic cirrhosis of liver with ascites (HCC)  No evidence of hepatic encephalopathy, bleeding or other complications of cirrhosis at this time    Chronic diastolic (congestive) heart failure (H[facility])  No evidence of cardiogenic volume overload at this time We will continue to monitor closely as we gently hydrate patient with intravenous fluids Temporarily holding home regimen of spironolactone and torsemide due to volume resuscitation and lactic acidosis    Pancreatic cyst  Noted on CT, outpatient surveillance    Fall at home, initial encounter  Found on floor of bathroom at her place of residence due to presumed fall Thought to be due to generalized weakness secondary to underlying infection and volume depletion No visible evidence of injury    Mixed  hyperlipidemia due to type 2 diabetes mellitus ([place])  Continue home regimen of simvastatin    GERD without esophagitis  Continue home regimen of Protonix   Code Status:  DNR Family Communication: Attempted to contact son unsuccessfully  Status is: Inpatient  Remains inpatient appropriate because:Ongoing diagnostic testing needed not appropriate for outpatient work up, IV treatments appropriate due to intensity of illness or inability to take PO, and Inpatient level of care appropriate due to severity of illness  Dispo: The patient is from: ALF              Anticipated d/c is to: ALF              Patient currently is not medically stable to d/c.   Difficult to place patient No        Vernelle Emerald MD [facility] Pager [phone]  If 7PM-7AM, please contact night-coverage [email or web address] Use universal [place] password for that web site. If you do not have the password, please call the hospital operator.  08/09/2020, 5:23 AM

## 2020-08-09 NOTE — ED Notes (Signed)
Discussed with Dr. Rodena Piety, Hospitalist, that pt is sleeping and will awaken to voice, however, has declined her breakfast tray. Per Hospitalist, holding pt's 2units Subcu Novolog at this time.

## 2020-08-09 NOTE — Progress Notes (Signed)
Patient admitted this morning by my partner 87 year old female from assisted living facility status post fall with multiple comorbidities and DNR.  She had fever of 102.8 at the time of admission. She presented with sepsis secondary to urinary tract infection.  She met sepsis criteria on admission.  Treated with Rocephin and continuing. Her lactic acid level is trending up will trend and continue IV fluids and IV Rocephin. I called his son Marcello Moores [phone] went to voicemail.

## 2020-08-09 NOTE — ED Notes (Signed)
Pt given PO Tylenol as ordered for fever. Third lactic obtained at this time per Hospitalist's order.

## 2020-08-09 NOTE — ED Notes (Signed)
Pt's third lactic obtained is 5.0, per lab. Dr. Rodena Piety, hospitalist, notified and aware. No additional orders at this time. Will continue to monitor.

## 2020-08-10 DIAGNOSIS — R7881 Bacteremia: Secondary | ICD-10-CM

## 2020-08-10 LAB — COMPREHENSIVE METABOLIC PANEL
ALT: 39 U/L (ref 0–44)
AST: 183 U/L — ABNORMAL HIGH (ref 15–41)
Albumin: 2.7 g/dL — ABNORMAL LOW (ref 3.5–5.0)
Alkaline Phosphatase: 69 U/L (ref 38–126)
Anion gap: 11 (ref 5–15)
BUN: 23 mg/dL (ref 8–23)
CO2: 22 mmol/L (ref 22–32)
Calcium: 8.8 mg/dL — ABNORMAL LOW (ref 8.9–10.3)
Chloride: 101 mmol/L (ref 98–111)
Creatinine, Ser: 0.9 mg/dL (ref 0.44–1.00)
GFR, Estimated: >60 mL/min (ref >60)
Glucose, Bld: 227 mg/dL — ABNORMAL HIGH (ref 70–99)
Potassium: 3.9 mmol/L (ref 3.5–5.1)
Sodium: 134 mmol/L — ABNORMAL LOW (ref 135–145)
Total Bilirubin: 1.6 mg/dL — ABNORMAL HIGH (ref 0.3–1.2)
Total Protein: 5.4 g/dL — ABNORMAL LOW (ref 6.5–8.1)

## 2020-08-10 LAB — CBC
HCT: 27.5 % — ABNORMAL LOW (ref 36.0–46.0)
Hemoglobin: 9.1 g/dL — ABNORMAL LOW (ref 12.0–15.0)
MCH: 30.6 pg (ref 26.0–34.0)
MCHC: 33.1 g/dL (ref 30.0–36.0)
MCV: 92.6 fL (ref 80.0–100.0)
Platelets: 42 10*3/uL — ABNORMAL LOW (ref 150–400)
RBC: 2.97 MIL/uL — ABNORMAL LOW (ref 3.87–5.11)
RDW: 15 % (ref 11.5–15.5)
WBC: 6.2 10*3/uL (ref 4.0–10.5)
nRBC: 0 % (ref 0.0–0.2)

## 2020-08-10 LAB — GLUCOSE, CAPILLARY
Glucose-Capillary: 204 mg/dL — ABNORMAL HIGH (ref 70–99)
Glucose-Capillary: 307 mg/dL — ABNORMAL HIGH (ref 70–99)
Glucose-Capillary: 276 mg/dL — ABNORMAL HIGH (ref 70–99)
Glucose-Capillary: 304 mg/dL — ABNORMAL HIGH (ref 70–99)

## 2020-08-10 MED ORDER — INSULIN GLARGINE 100 UNIT/ML ~~LOC~~ SOLN
20.0000 [IU] | Freq: Every day | SUBCUTANEOUS | Status: DC
  Administered 2020-08-10: 20 [IU] via SUBCUTANEOUS
  Filled 2020-08-10: qty 0.2

## 2020-08-10 MED ORDER — INSULIN GLARGINE 100 UNIT/ML ~~LOC~~ SOLN
10.0000 [IU] | Freq: Every morning | SUBCUTANEOUS | Status: DC
  Administered 2020-08-11: 10 [IU] via SUBCUTANEOUS
  Filled 2020-08-10: qty 0.1

## 2020-08-10 NOTE — Plan of Care (Signed)
  Problem: Education: Goal: Knowledge of General Education information will improve Description: Including pain rating scale, medication(s)/side effects and non-pharmacologic comfort measures Outcome: Progressing   Problem: Clinical Measurements: Goal: Will remain free from infection Outcome: Progressing Goal: Respiratory complications will improve Outcome: Progressing   Problem: Nutrition: Goal: Adequate nutrition will be maintained Outcome: Progressing   

## 2020-08-10 NOTE — Evaluation (Signed)
Physical Therapy Evaluation Patient Details Name: Jessica Dennis MRN: [id number] DOB: 1932-07-28 Today's Date: 08/10/2020   History of Present Illness  87 year old female with past medical history of alcoholic liver cirrhosis, insulin-dependent diabetes mellitus type 2, diabetic neuropathy, diastolic congestive heart failure , hypertension, hyperlipidemia, hypothyroidism, gastroesophageal reflux disease who presents to ED , brought in by EMS from [facility] assisted living facility due to being found down on the bathroom floor. Patient was noted to have fever,multiple sirs criteria with clinical evidence of encephalopathy and suspected urinary tract infection all concerning for developing sepsis,  Clinical Impression  The patient is resting in bed, pleasant and  participating in mobility today. Min assist to sit up and stand with RW, took several steps to recliner.  Patient resides in ALF and may  benefit from higher level of carewith recent fall.  Patient complained of dizziness when standing. BP 132/63 Pt admitted with above diagnosis.  Pt currently with functional limitations due to the deficits listed below (see PT Problem List). Pt will benefit from skilled PT to increase their independence and safety with mobility to allow discharge to the venue listed below.        SPO2 on RA 100% HR 93. SNF    Equipment Recommendations  None recommended by PT    Recommendations for Other Services OT consult     Precautions / Restrictions Precautions Precautions: Fall Precaution Comments: incontinence, use  depends/ pad and pantiy      Mobility  Bed Mobility Overal bed mobility: Needs Assistance Bed Mobility: Supine to Sit     Supine to sit: Min assist;HOB elevated     General bed mobility comments: light hand support to sit upright    Transfers Overall transfer level: Needs assistance Equipment used: Rolling walker (2 wheeled) Transfers: Sit to/from [employer] Sit to Stand: Min assist Stand pivot transfers: Min assist       General transfer comment: steady assist to rise, using RW.  Steps to recliner with RW.  Ambulation/Gait             General Gait Details: TBA  Stairs            Wheelchair Mobility    Modified Rankin (Stroke Patients Only)       Balance Overall balance assessment: Needs assistance;History of Falls Sitting-balance support: Bilateral upper extremity supported;Feet supported Sitting balance-Leahy Scale: Good     Standing balance support: During functional activity;Bilateral upper extremity supported Standing balance-Leahy Scale: Poor Standing balance comment: reliant on UE's                             Pertinent Vitals/Pain Pain Assessment: Faces Faces Pain Scale: Hurts little more Pain Location: LLeg when lifted from bed Pain Descriptors / Indicators: Discomfort Pain Intervention(s): Monitored during session    Home Living Family/patient expects to be discharged to:: Assisted living               Home Equipment: Kasandra Knudsen - single point;Bedside commode;Grab bars - tub/shower;Shower seat - built in;Walker - 2 wheels Additional Comments: resides in ALF,    Prior Function           Comments: pt uses RW at baseline in apartment. Unsure of extra  assistnace she receives at ALF, patient was not able to give  information. States "I can walk before I fell"     Hand Dominance   Dominant Hand: Right    Extremity/Trunk  Assessment   Upper Extremity Assessment Upper Extremity Assessment: RUE deficits/detail RUE Deficits / Details: limited shoulder elevation with pain    Lower Extremity Assessment Lower Extremity Assessment: Generalized weakness    Cervical / Trunk Assessment Cervical / Trunk Assessment: Normal  Communication   Communication: No difficulties  Cognition Arousal/Alertness: Awake/alert Behavior During Therapy: WFL for tasks  assessed/performed Overall Cognitive Status: Impaired/Different from baseline Area of Impairment: Orientation;Attention;Memory;Following commands;Safety/judgement;Awareness                   Current Attention Level: Selective Memory: Decreased short-term memory Following Commands: Follows one step commands with increased time   Awareness: Emergent   General Comments: oriented too [facility] and had a fall, no why she fell      General Comments      Exercises     Assessment/Plan    PT Assessment Patient needs continued PT services  PT Problem List Decreased strength;Decreased mobility;Decreased safety awareness;Decreased range of motion;Decreased knowledge of precautions;Decreased activity tolerance;Decreased cognition;Decreased balance;Decreased knowledge of use of DME;Pain       PT Treatment Interventions DME instruction;Therapeutic activities;Cognitive remediation;Gait training;Therapeutic exercise;Patient/family education;Functional mobility training;Balance training    PT Goals (Current goals can be found in the Care Plan section)  Acute Rehab PT Goals Patient Stated Goal: agreed to getting up PT Goal Formulation: With patient Time For Goal Achievement: 08/24/20 Potential to Achieve Goals: Fair    Frequency Min 2X/week   Barriers to discharge        Co-evaluation               AM-PAC PT "6 Clicks" Mobility  Outcome Measure Help needed turning from your back to your side while in a flat bed without using bedrails?: A Little Help needed moving from lying on your back to sitting on the side of a flat bed without using bedrails?: A Little Help needed moving to and from a bed to a chair (including a wheelchair)?: A Little Help needed standing up from a chair using your arms (e.g., wheelchair or bedside chair)?: A Little Help needed to walk in hospital room?: A Lot Help needed climbing 3-5 steps with a railing? : Total 6 Click Score: 15    End of Session  Equipment Utilized During Treatment: Gait belt Activity Tolerance: Patient tolerated treatment well;Patient limited by fatigue Patient left: in chair;with call bell/phone within reach;with chair alarm set Nurse Communication: Mobility status PT Visit Diagnosis: Unsteadiness on feet (R26.81);Difficulty in walking, not elsewhere classified (R26.2);Pain;Repeated falls (R29.6) Pain - Right/Left: Right Pain - part of body: Shoulder    Time: 1610-9604 PT Time Calculation (min) (ACUTE ONLY): 29 min   Charges:   PT Evaluation $PT Eval Low Complexity: 1 Low PT Treatments $Therapeutic Activity: 8-22 mins        Tresa Endo PT Acute Rehabilitation Services Pager [phone] Office [phone]   Claretha Cooper 08/10/2020, 11:10 AM

## 2020-08-10 NOTE — NC FL2 (Signed)
[place] LEVEL OF CARE SCREENING TOOL     IDENTIFICATION  Patient Name: Jessica Dennis Birthdate: 05-27-1932 Sex: female Admission Date (Current Location): 08/08/2020  [facility] and [place] Number:  Herbalist and Address:  [facility],  [address], [place]      Provider Number: [id number]  Attending Physician Name and Address:  Eugenie Filler, MD  Relative Name and Phone Number:  Zalma Channing [phone]    Current Level of Care: Hospital Recommended Level of Care: [place] Prior Approval Number:    Date Approved/Denied:   PASRR Number: [id number]  Discharge Plan: SNF    Current Diagnoses: Patient Active Problem List   Diagnosis Date Noted   Sepsis secondary to UTI ([place]) 08/09/2020   Fall at home, initial encounter 08/09/2020   Lactic acidosis 08/09/2020   Mixed hyperlipidemia due to type 2 diabetes mellitus ([place]) 08/09/2020   Cognitive impairment 07/08/2020   Anemia in chronic kidney disease 05/18/2020   Bilirubinemia 05/13/2020   Hypokalemia 05/04/2020   CKD (chronic kidney disease) stage 3, GFR 30-59 ml/min ([place]) 03/24/2020   Hyponatremia 03/15/2020   Diabetic neuropathy ([place]) 02/29/2020   Pure hypercholesterolemia 02/29/2020   Thyroid disease    Hypertension    Cirrhosis of liver ([place])    Borderline diabetes    Chronic lower back pain    Anemia    Pancreatic cyst 02/01/2020   Pleural effusion on right 01/13/2020   Chronic diastolic (congestive) heart failure ([place]) 01/13/2020   Lightheadedness 11/27/2019   Nonspecific abnormal electrocardiogram (ECG) (EKG) 11/27/2019   Abnormal electrocardiogram 11/27/2019   Bilateral leg edema 11/27/2019   Primary hypertension 11/27/2019   Chronic atrial fibrillation ([place]) 11/09/2019   Multiple falls    Hyperglycemia due to type 2 diabetes mellitus ([place]) 10/27/2018   Generalized anxiety disorder 10/27/2018   Open wound, lower leg, left, initial  encounter 10/27/2018   Tremor of right hand 10/27/2018   GAVE (gastric antral vascular ectasia) 00/86/7619   Alcoholic cirrhosis of liver with ascites ([place]) 08/02/2016   Portal hypertensive gastropathy ([place]) 08/02/2016   Iron deficiency anemia due to chronic blood loss 01/19/2015   Occult GI bleeding 01/19/2015   Angiodysplasia of ascending colon 10/25/2014   H/O bone density study 04/27/2013   Type 2 diabetes mellitus with hyperglycemia, with long-term current use of insulin ([place]) 03/24/2013   Vaginal dryness, menopausal 01/20/2013   Atrophic vaginitis 01/20/2013   S/P hysterectomy 01/20/2013   Cervical nerve root disorder 11/18/2012   Shoulder weakness 11/18/2012   Cancer of the skin, basal cell 09/03/2012   Squamous cell skin cancer 09/03/2012   Thrombocytopenia ([place]) 08/14/2012   Lymphadenopathy of right cervical region 04/01/2012   Hyperglycemia 09/19/2011   Heel pain 08/06/2011   Plantar fasciitis 08/06/2011   Arthritis of knee, degenerative 08/06/2011   Gonalgia 08/06/2011   History of anemia 06/28/2011   Anxiety 06/28/2011   Essential hypertension, benign 06/28/2011   GERD (gastroesophageal reflux disease) 06/28/2011   Hyperlipidemia LDL goal <100 06/28/2011   History of hysterectomy 06/28/2011   Menopause 06/28/2011   Glaucoma 06/28/2011   DJD (degenerative joint disease) 06/28/2011   Essential hypertension 04/27/2010   GERD without esophagitis 10/28/2009   Anemia, unspecified 05/24/2009   Hypothyroidism 11/22/2008   Adult hypothyroidism 11/22/2008   Diverticulosis of colon 05/15/2007   Elevated fasting blood sugar 05/15/2007   Other psoriasis 05/15/2007   Symptomatic menopausal or female climacteric states 05/15/2007   Impaired fasting glucose  05/15/2007    Orientation RESPIRATION BLADDER Height & Weight     Self, Time, Situation    Continent Weight: 83.9 kg Height:  5\' 6"  (167.6 cm)  BEHAVIORAL SYMPTOMS/MOOD NEUROLOGICAL BOWEL NUTRITION STATUS       Continent Diet (CHO MOD)  AMBULATORY STATUS COMMUNICATION OF NEEDS Skin   Limited Assist Verbally Normal                       Personal Care Assistance Level of Assistance  Bathing, Feeding, Dressing Bathing Assistance: Limited assistance Feeding assistance: Limited assistance Dressing Assistance: Limited assistance     Functional Limitations Info  Sight, Hearing, Speech Sight Info: Adequate Hearing Info: Adequate Speech Info: Adequate    SPECIAL CARE FACTORS FREQUENCY  PT (By licensed PT), OT (By licensed OT)     PT Frequency:  (5x week) OT Frequency:  (5x week)            Contractures Contractures Info: Not present    Additional Factors Info  Code Status, Allergies, Insulin Sliding Scale Code Status Info:  (DNR) Allergies Info:  (Tizanidine)   Insulin Sliding Scale Info:  (SSI see MAR)       Current Medications (08/10/2020):  This is the current hospital active medication list Current Facility-Administered Medications  Medication Dose Route Frequency Provider Last Rate Last Admin   acetaminophen (TYLENOL) tablet 650 mg  650 mg Oral Q6H PRN Vernelle Emerald, MD   650 mg at 08/09/20 3329   Or   acetaminophen (TYLENOL) suppository 650 mg  650 mg Rectal Q6H PRN Shalhoub, Sherryll Burger, MD       cefTRIAXone (ROCEPHIN) 2 g in sodium chloride 0.9 % 100 mL IVPB  2 g Intravenous Q24H Vernelle Emerald, MD 200 mL/hr at 08/09/20 2112 2 g at 08/09/20 2112   enoxaparin (LOVENOX) injection 30 mg  30 mg Subcutaneous Q24H Vernelle Emerald, MD   30 mg at 08/10/20 0837   insulin aspart (novoLOG) injection 0-6 Units  0-6 Units Subcutaneous TID AC & HS Shalhoub, Sherryll Burger, MD   2 Units at 08/10/20 0837   insulin glargine (LANTUS) injection 15 Units  15 Units Subcutaneous QHS Vernelle Emerald, MD   15 Units at 08/09/20 2222   insulin glargine (LANTUS) injection 5 Units  5 Units Subcutaneous q morning Georgette Shell, MD   5 Units at 08/10/20 1129   lactulose (CHRONULAC) 10  GM/15ML solution 20 g  20 g Oral Daily Vernelle Emerald, MD   20 g at 08/10/20 0838   latanoprost (XALATAN) 0.005 % ophthalmic solution 1 drop  1 drop Both Eyes QHS Shalhoub, Sherryll Burger, MD   1 drop at 08/09/20 2208   levothyroxine (SYNTHROID) tablet 75 mcg  75 mcg Oral Daily Vernelle Emerald, MD   75 mcg at 08/10/20 0521   metoprolol succinate (TOPROL-XL) 24 hr tablet 12.5 mg  12.5 mg Oral Daily Vernelle Emerald, MD   12.5 mg at 08/10/20 0838   ondansetron (ZOFRAN) tablet 4 mg  4 mg Oral Q6H PRN Vernelle Emerald, MD       Or   ondansetron [facility]) injection 4 mg  4 mg Intravenous Q6H PRN Shalhoub, Sherryll Burger, MD       pantoprazole (PROTONIX) EC tablet 40 mg  40 mg Oral Daily Vernelle Emerald, MD   40 mg at 08/10/20 5188   simvastatin (ZOCOR) tablet 20 mg  20 mg Oral QHS Shalhoub, Sherryll Burger, MD  20 mg at 08/09/20 2112     Discharge Medications: Please see discharge summary for a list of discharge medications.  Relevant Imaging Results:  Relevant Lab Results:   Additional Information SS#[id number] [place], [place]

## 2020-08-10 NOTE — TOC Progression Note (Addendum)
Transition of Care [facility]) - Progression Note    Patient Details  Name: Jessica Dennis MRN: [id number] Date of Birth: 03-09-1932  Transition of [facility]) CM/SW Contact  Laurisa Sahakian, [place], RN Phone Number: 08/10/2020, 11:55 AM  Clinical Narrative: PT recc SNF-TC HTA rep Tammy to initiate auth-await auth; also auth for PTAR. Dtr in Community education officer notified & agree.Will need covid within 48hrs of d/c.  12:16p-fl2 sent to [place] level rep Ivy aware.await auth. 5p-HTA rep-auth for [id number] med review-await outcome.    Expected Discharge Plan: Skilled Nursing Facility Barriers to Discharge: Ship broker  Expected Discharge Plan and Services Expected Discharge Plan: [place]   Discharge Planning Services: CM Consult Post Acute Care Choice: [place] Living arrangements for the past 2 months: [place]                                       Social Determinants of Health (SDOH) Interventions    Readmission Risk Interventions No flowsheet data found.

## 2020-08-10 NOTE — Progress Notes (Signed)
PROGRESS NOTE    Jessica Dennis  IDP:[id number] DOB: 1932/12/25 DOA: 08/08/2020 PCP: Virgie Dad, MD (Confirm with patient/family/NH records and if not entered, this HAS to be entered at [facility] point of entry. "No PCP" if truly none.)   Chief Complaint  Patient presents with   Altered Mental Status    Brief Narrative: Patient is 87 year old female history of alcoholic liver cirrhosis, insulin-dependent diabetes mellitus type 2, diabetic neuropathy, diastolic CHF, hypertension, hyperlipidemia, hypothyroidism, GERD presented to the ED from assisted living facility after being found down on the bathroom floor.  Patient with complaints of generalized weakness and poor appetite.  Patient also had some complaints of vague generalized abdominal discomfort.  Patient went found by staff was tired and lethargic and noted to be febrile.  Patient noted to have a temp of 102.8 on EMS arrival to the facility patient also noted to be incontinent of urine.  Patient admitted noted to have recurrent fevers in the ED course and have multiple SIRS criteria with evidence of encephalopathy and concern for UTI.  CT abdomen and pelvis which was done unremarkable.   Assessment & Plan:   Principal Problem:   Sepsis secondary to UTI [facility]) Active Problems:   Essential hypertension, benign   Hypothyroidism   GERD without esophagitis   Alcoholic cirrhosis of liver with ascites (HCC)   Chronic diastolic (congestive) heart failure (HCC)   Pancreatic cyst   Fall at home, initial encounter   Lactic acidosis   Mixed hyperlipidemia due to type 2 diabetes mellitus ([place])   Bacteremia due to Gram-positive bacteria   #1 sepsis secondary to UTI and possible bacteremia -Patient admitted with multiple SIRS criteria in the setting of initially suspected UTI with evidence of encephalopathy, lactic acidosis with concerns for developing sepsis. -Urinalysis not quite compelling for UTI however initially felt on admission to be  likely etiology of patient's source of infection. -Chest x-ray done was negative for any evidence of pneumonia. -COVID-19 PCR negative. -Blood cultures obtained with preliminary results of Streptococcus gordonii with susceptibilities pending. -IV Rocephin adjusted to 2 g daily. -Supportive care.  2.  Probable UTI -Urine cultures with > 100,000 colonies of Enterococcus Avium with susceptibilities pending. -IV Rocephin.  3.  Probable bacteremia -Blood cultures growing Streptococcus gordonii with susceptibilities pending. -Continue IV Rocephin.  4.  Acute metabolic encephalopathy -Likely secondary to problems #1, 2, 3. -Slowly improving clinically. -Continue empiric IV antibiotics. -Follow.  5.  Lactic acidosis -Felt secondary to volume depletion and infection. -Lactic acid levels trending down. -Continue IV antibiotics, IV fluids. -Repeat labs in the morning.  6.  Hypertension -Toprol-XL.  7.  Hypothyroidism -Synthroid.  8.  Diabetes mellitus type 2 -Hemoglobin A1c 6.4. -CBG of 204 this morning. -Hold Januvia, metformin -Increase Lantus to 10 units in the morning, 20 units at bedtime. -SSI.  9.  History of alcoholic cirrhosis -No evidence of hepatic encephalopathy. -Continue home regimen lactulose. -Hold spironolactone and Demadex. -Follow.  10.  Chronic diastolic CHF -Patient with some noted lower extremity edema. -Diuretics on hold of spironolactone and Demadex due to volume resuscitation and lactic acidosis. -Follow.  11.  Pancreatic cyst -Noted on CT. -Outpatient follow-up.  69.  Fall -PT/OT.  13.  GERD -PPI.    DVT prophylaxis: Lovenox Code Status: DNR Family Communication: Updated patient.  No family at bedside. Disposition:   Status is: Inpatient  Remains inpatient appropriate because:IV treatments appropriate due to intensity of illness or inability to take PO  Dispo: The patient is from:  ALF friend's home              Anticipated d/c is  to:  TBD              Patient currently is not medically stable to d/c.   Difficult to place patient No       Consultants:  None  Procedures:  CT abdomen and pelvis 08/09/2020 Chest x-ray 08/08/2020   Antimicrobials:  IV Rocephin 08/08/2020>>>   Subjective: Sitting up in recliner.  Complaining of generalized weakness.  Denies any chest pain.  No significant shortness of breath.  No abdominal pain.  Denies any dysuria. Alert to self place.  Thinks his 2002.  Knows who the president is.  Objective: Vitals:   08/10/20 0206 08/10/20 0525 08/10/20 1144 08/10/20 2012  BP: 128/61 116/63 (!) 110/53 103/61  Pulse: (!) 109 (!) 107 94 (!) 109  Resp: 18 18 18 16   Temp: 97.8 F (36.6 C) 97.7 F (36.5 C) 97.8 F (36.6 C) 97.9 F (36.6 C)  TempSrc: Oral Oral  Oral  SpO2: 92% 99% 98% 98%  Weight:      Height:        Intake/Output Summary (Last 24 hours) at 08/10/2020 2024 Last data filed at 08/10/2020 1852 Gross per 24 hour  Intake 703.84 ml  Output 825 ml  Net -121.16 ml   Filed Weights   08/08/20 1742  Weight: 83.9 kg    Examination:  General exam: Appears calm and comfortable  Respiratory system: Clear to auscultation. Respiratory effort normal. Cardiovascular system: S1 & S2 heard, RRR. No JVD, murmurs, rubs, gallops or clicks.  1-2+ bilateral lower extremity edema.  Gastrointestinal system: Abdomen is nondistended, soft and nontender. No organomegaly or masses felt. Normal bowel sounds heard. Central nervous system: Alert and oriented. No focal neurological deficits. Extremities: 1-2+ bilateral lower extremity edema. Skin: No rashes, lesions or ulcers Psychiatry: Judgement and insight appear normal. Mood & affect appropriate.     Data Reviewed: I have personally reviewed following labs and imaging studies  CBC: Recent Labs  Lab 08/05/20 1045 08/08/20 1748 08/09/20 0612 08/10/20 0413  WBC 6.4 13.2* 8.6 6.2  NEUTROABS 4.9 11.9* 7.8*  --   HGB 10.6* 10.3*  9.7* 9.1*  HCT 31.8* 31.4* 29.6* 27.5*  MCV 92.4 93.2 93.4 92.6  PLT 70* 66* 52* 42*    Basic Metabolic Panel: Recent Labs  Lab 08/05/20 1045 08/08/20 1748 08/09/20 0612 08/10/20 0413  NA 135 137 134* 134*  K 4.2 4.1 4.2 3.9  CL 97* 100 100 101  CO2 28 22 22 22   GLUCOSE 287* 194* 206* 227*  BUN 18 16 20 23   CREATININE 1.26* 1.50* 1.24* 0.90  CALCIUM 9.5 9.6 9.2 8.8*  MG  --   --  1.9  --     GFR: Estimated Creatinine Clearance: 49.9 mL/min (by C-G formula based on SCr of 0.9 mg/dL).  Liver Function Tests: Recent Labs  Lab 08/05/20 1045 08/08/20 1748 08/09/20 0612 08/10/20 0413  AST 31 56* 234* 183*  ALT 17 26 43 39  ALKPHOS 101 98 89 69  BILITOT 2.1* 2.7* 2.6* 1.6*  PROT 5.6* 6.3* 5.9* 5.4*  ALBUMIN 3.5 3.3* 3.1* 2.7*    CBG: Recent Labs  Lab 08/09/20 2155 08/10/20 0725 08/10/20 1142 08/10/20 1633 08/10/20 2010  GLUCAP 217* 204* 307* 276* 304*     Recent Results (from the past 240 hour(s))  Blood culture (routine x 2)     Status: Abnormal (Preliminary  result)   Collection Time: 08/08/20  5:50 PM   Specimen: BLOOD  Result Value Ref Range Status   Specimen Description   Final    BLOOD RIGHT ANTECUBITAL Performed at [facility] [address], [place], [place] [address]    Special Requests   Final    BOTTLES DRAWN AEROBIC AND ANAEROBIC Blood Culture adequate volume Performed at [facility] [address], [place], [place] [address]    Culture  Setup Time   Final    GRAM POSITIVE COCCI IN CHAINS ANAEROBIC BOTTLE ONLY CRITICAL RESULT CALLED TO, READ BACK BY AND VERIFIED WITH: Guadlupe Spanish PHARMD 1902 08/09/20 A BROWNING    Culture (A)  Final    STREPTOCOCCUS GORDONII SUSCEPTIBILITIES TO FOLLOW Performed at [facility], [place] [address], [place], [place] [address]    Report Status PENDING  Incomplete  Blood Culture ID Panel (Reflexed)     Status: Abnormal   Collection Time: 08/08/20  5:50 PM  Result  Value Ref Range Status   Enterococcus faecalis NOT DETECTED NOT DETECTED Final   Enterococcus Faecium NOT DETECTED NOT DETECTED Final   Listeria monocytogenes NOT DETECTED NOT DETECTED Final   Staphylococcus species NOT DETECTED NOT DETECTED Final   Staphylococcus aureus (BCID) NOT DETECTED NOT DETECTED Final   Staphylococcus epidermidis NOT DETECTED NOT DETECTED Final   Staphylococcus lugdunensis NOT DETECTED NOT DETECTED Final   Streptococcus species DETECTED (A) NOT DETECTED Final    Comment: Not Enterococcus species, Streptococcus agalactiae, Streptococcus pyogenes, or Streptococcus pneumoniae. CRITICAL RESULT CALLED TO, READ BACK BY AND VERIFIED WITH: N GLOGOVAC PHARMD 1902 08/09/20 A BROWNING    Streptococcus agalactiae NOT DETECTED NOT DETECTED Final   Streptococcus pneumoniae NOT DETECTED NOT DETECTED Final   Streptococcus pyogenes NOT DETECTED NOT DETECTED Final   A.calcoaceticus-baumannii NOT DETECTED NOT DETECTED Final   Bacteroides fragilis NOT DETECTED NOT DETECTED Final   Enterobacterales NOT DETECTED NOT DETECTED Final   Enterobacter cloacae complex NOT DETECTED NOT DETECTED Final   Escherichia coli NOT DETECTED NOT DETECTED Final   Klebsiella aerogenes NOT DETECTED NOT DETECTED Final   Klebsiella oxytoca NOT DETECTED NOT DETECTED Final   Klebsiella pneumoniae NOT DETECTED NOT DETECTED Final   Proteus species NOT DETECTED NOT DETECTED Final   Salmonella species NOT DETECTED NOT DETECTED Final   Serratia marcescens NOT DETECTED NOT DETECTED Final   Haemophilus influenzae NOT DETECTED NOT DETECTED Final   Neisseria meningitidis NOT DETECTED NOT DETECTED Final   Pseudomonas aeruginosa NOT DETECTED NOT DETECTED Final   Stenotrophomonas maltophilia NOT DETECTED NOT DETECTED Final   Candida albicans NOT DETECTED NOT DETECTED Final   Candida auris NOT DETECTED NOT DETECTED Final   Candida glabrata NOT DETECTED NOT DETECTED Final   Candida krusei NOT DETECTED NOT DETECTED  Final   Candida parapsilosis NOT DETECTED NOT DETECTED Final   Candida tropicalis NOT DETECTED NOT DETECTED Final   Cryptococcus neoformans/gattii NOT DETECTED NOT DETECTED Final    Comment: Performed at [facility], [address] [place], [place] [address]  Blood culture (routine x 2)     Status: None (Preliminary result)   Collection Time: 08/08/20  5:55 PM   Specimen: BLOOD  Result Value Ref Range Status   Specimen Description   Final    BLOOD BLOOD RIGHT HAND Performed at [facility] [address] [place], [place] [address]    Special Requests   Final    BOTTLES DRAWN AEROBIC AND  ANAEROBIC Blood Culture adequate volume Performed at [facility] [address] [place], [place] [address]    Culture   Final    NO GROWTH 1 DAY Performed at [facility], [place] [address], [place], [place] [address]    Report Status PENDING  Incomplete  Resp Panel by RT-PCR (Flu A&B, Covid) Nasopharyngeal Swab     Status: None   Collection Time: 08/08/20  9:01 PM   Specimen: Nasopharyngeal Swab; Nasopharyngeal(NP) swabs in vial transport medium  Result Value Ref Range Status   SARS Coronavirus 2 by RT PCR NEGATIVE NEGATIVE Final    Comment: (NOTE) SARS-CoV-2 target nucleic acids are NOT DETECTED.  The SARS-CoV-2 RNA is generally detectable in upper respiratory specimens during the acute phase of infection. The lowest concentration of SARS-CoV-2 viral copies this assay can detect is 138 copies/mL. A negative result does not preclude SARS-Cov-2 infection and should not be used as the sole basis for treatment or other patient management decisions. A negative result may occur with  improper specimen collection/handling, submission of specimen other than nasopharyngeal swab, presence of viral mutation(s) within the areas targeted by this assay, and inadequate number of viral copies(<138 copies/mL). A negative result must be combined  with clinical observations, patient history, and epidemiological information. The expected result is Negative.  Fact Sheet for Patients:  EntrepreneurPulse.com.au  Fact Sheet for Healthcare Providers:  IncredibleEmployment.be  This test is no t yet approved or cleared by the [place] FDA and  has been authorized for detection and/or diagnosis of SARS-CoV-2 by FDA under an Emergency Use Authorization (EUA). This EUA will remain  in effect (meaning this test can be used) for the duration of the COVID-19 declaration under Section 564(b)(1) of the Act, 21 U.S.C.section 360bbb-3(b)(1), unless the authorization is terminated  or revoked sooner.       Influenza A by PCR NEGATIVE NEGATIVE Final   Influenza B by PCR NEGATIVE NEGATIVE Final    Comment: (NOTE) The Xpert Xpress SARS-CoV-2/FLU/RSV plus assay is intended as an aid in the diagnosis of influenza from Nasopharyngeal swab specimens and should not be used as a sole basis for treatment. Nasal washings and aspirates are unacceptable for Xpert Xpress SARS-CoV-2/FLU/RSV testing.  Fact Sheet for Patients: EntrepreneurPulse.com.au  Fact Sheet for Healthcare Providers: IncredibleEmployment.be  This test is not yet approved or cleared by the [place] FDA and has been authorized for detection and/or diagnosis of SARS-CoV-2 by FDA under an Emergency Use Authorization (EUA). This EUA will remain in effect (meaning this test can be used) for the duration of the COVID-19 declaration under Section 564(b)(1) of the Act, 21 U.S.C. section 360bbb-3(b)(1), unless the authorization is terminated or revoked.  Performed at [facility], [place] [address] [place], [place] [address]   Urine Culture     Status: Abnormal (Preliminary result)   Collection Time: 08/09/20 12:19 AM   Specimen: Urine, Clean Catch  Result Value Ref Range Status    Specimen Description   Final    URINE, CLEAN CATCH Performed at [facility], [place] [address], [place], [place] [address]    Special Requests   Final    NONE Performed at [facility], [place] [address], [place], [place] [address]    Culture (A)  Final    >=100,000 COLONIES/mL ENTEROCOCCUS AVIUM SUSCEPTIBILITIES TO FOLLOW Performed at [facility], [place] [address], [place], [place] [address]    Report Status PENDING  Incomplete  Radiology Studies: CT ABDOMEN PELVIS W CONTRAST  Result Date: 08/09/2020 CLINICAL DATA:  Abdominal pain EXAM: CT ABDOMEN AND PELVIS WITH CONTRAST TECHNIQUE: Multidetector CT imaging of the abdomen and pelvis was performed using the standard protocol following bolus administration of intravenous contrast. CONTRAST:  88mL OMNIPAQUE IOHEXOL 350 MG/ML SOLN COMPARISON:  01/12/2020 FINDINGS: LOWER CHEST: Right pleural effusion with basilar atelectasis HEPATOBILIARY: Diffusely nodular hepatic contours with relative hypertrophy of the caudate and left hepatic lobe, consistent with hepatic cirrhosis. No focal liver lesion. No biliary dilatation. Distended gallbladder PANCREAS: 12 mm cystic focus in the distal pancreatic body, slightly larger than on 01/12/2020. SPLEEN: Spleen is enlarged, measuring 16.7 cm in craniocaudal dimension. ADRENALS/URINARY TRACT: The adrenal glands are normal. No hydronephrosis, nephroureterolithiasis or solid renal mass. The urinary bladder is normal for degree of distention STOMACH/BOWEL: There is no hiatal hernia. Normal duodenal course and caliber. No small bowel dilatation or inflammation. Rectosigmoid diverticulosis without acute inflammation. Normal appendix. VASCULAR/LYMPHATIC: There is calcific atherosclerosis of the abdominal aorta. No lymphadenopathy. Recanalized umbilical vein. REPRODUCTIVE: Status post hysterectomy. No adnexal mass. MUSCULOSKELETAL. Bilateral hip arthroplasties OTHER: None.  IMPRESSION: 1. No acute abnormality of the abdomen or pelvis. 2. Hepatic cirrhosis with evidence of portal hypertension. 3. Right pleural effusion with basilar atelectasis. 4. 12 mm cystic focus in the distal pancreatic body. Recommend follow up pre and post contrast MRI/MRCP or pancreatic protocol CT in 2 years. This recommendation follows ACR consensus guidelines: Management of Incidental Pancreatic Cysts: A White Paper of the ACR Incidental Findings Committee. [place] 1007;12:197-588. Aortic Atherosclerosis (ICD10-I70.0). Electronically Signed   By: Ulyses Jarred M.D.   On: 08/09/2020 03:50        Scheduled Meds:  enoxaparin (LOVENOX) injection  30 mg Subcutaneous Q24H   insulin aspart  0-6 Units Subcutaneous TID AC & HS   [START ON 08/11/2020] insulin glargine  10 Units Subcutaneous q morning   insulin glargine  20 Units Subcutaneous QHS   lactulose  20 g Oral Daily   latanoprost  1 drop Both Eyes QHS   levothyroxine  75 mcg Oral Daily   metoprolol succinate  12.5 mg Oral Daily   pantoprazole  40 mg Oral Daily   simvastatin  20 mg Oral QHS   Continuous Infusions:  cefTRIAXone (ROCEPHIN)  IV 2 g (08/09/20 2112)     LOS: 1 day    Time spent: 40 minutes    Irine Seal, MD [facility]   To contact the attending provider between 7A-7P or the covering provider during after hours 7P-7A, please log into the web site [email or web address] and access using universal [place] password for that web site. If you do not have the password, please call the hospital operator.  08/10/2020, 8:24 PM

## 2020-08-11 ENCOUNTER — Inpatient Hospital Stay (HOSPITAL_COMMUNITY): Payer: HMO

## 2020-08-11 LAB — CULTURE, BLOOD (ROUTINE X 2): Special Requests: ADEQUATE

## 2020-08-11 LAB — COMPREHENSIVE METABOLIC PANEL
ALT: 42 U/L (ref 0–44)
AST: 152 U/L — ABNORMAL HIGH (ref 15–41)
Albumin: 2.6 g/dL — ABNORMAL LOW (ref 3.5–5.0)
Alkaline Phosphatase: 71 U/L (ref 38–126)
Anion gap: 8 (ref 5–15)
BUN: 16 mg/dL (ref 8–23)
CO2: 24 mmol/L (ref 22–32)
Calcium: 8.7 mg/dL — ABNORMAL LOW (ref 8.9–10.3)
Chloride: 102 mmol/L (ref 98–111)
Creatinine, Ser: 0.82 mg/dL (ref 0.44–1.00)
GFR, Estimated: >60 mL/min (ref >60)
Glucose, Bld: 231 mg/dL — ABNORMAL HIGH (ref 70–99)
Potassium: 3.9 mmol/L (ref 3.5–5.1)
Sodium: 134 mmol/L — ABNORMAL LOW (ref 135–145)
Total Bilirubin: 1.4 mg/dL — ABNORMAL HIGH (ref 0.3–1.2)
Total Protein: 5.2 g/dL — ABNORMAL LOW (ref 6.5–8.1)

## 2020-08-11 LAB — URINE CULTURE: Culture: >=100000 — AB

## 2020-08-11 LAB — GLUCOSE, CAPILLARY
Glucose-Capillary: 215 mg/dL — ABNORMAL HIGH (ref 70–99)
Glucose-Capillary: 319 mg/dL — ABNORMAL HIGH (ref 70–99)
Glucose-Capillary: 290 mg/dL — ABNORMAL HIGH (ref 70–99)
Glucose-Capillary: 277 mg/dL — ABNORMAL HIGH (ref 70–99)

## 2020-08-11 LAB — CBC
HCT: 28.6 % — ABNORMAL LOW (ref 36.0–46.0)
Hemoglobin: 9.2 g/dL — ABNORMAL LOW (ref 12.0–15.0)
MCH: 29.8 pg (ref 26.0–34.0)
MCHC: 32.2 g/dL (ref 30.0–36.0)
MCV: 92.6 fL (ref 80.0–100.0)
Platelets: 41 10*3/uL — ABNORMAL LOW (ref 150–400)
RBC: 3.09 MIL/uL — ABNORMAL LOW (ref 3.87–5.11)
RDW: 14.6 % (ref 11.5–15.5)
WBC: 3.7 10*3/uL — ABNORMAL LOW (ref 4.0–10.5)
nRBC: 0 % (ref 0.0–0.2)

## 2020-08-11 LAB — MAGNESIUM: Magnesium: 2 mg/dL (ref 1.7–2.4)

## 2020-08-11 LAB — LACTIC ACID, PLASMA: Lactic Acid, Venous: 1.7 mmol/L (ref 0.5–1.9)

## 2020-08-11 MED ORDER — INSULIN GLARGINE 100 UNIT/ML ~~LOC~~ SOLN
14.0000 [IU] | Freq: Every morning | SUBCUTANEOUS | Status: DC
  Filled 2020-08-11: qty 0.14

## 2020-08-11 MED ORDER — INSULIN GLARGINE 100 UNIT/ML ~~LOC~~ SOLN
4.0000 [IU] | Freq: Once | SUBCUTANEOUS | Status: AC
  Administered 2020-08-11: 4 [IU] via SUBCUTANEOUS
  Filled 2020-08-11: qty 0.04

## 2020-08-11 MED ORDER — INSULIN ASPART 100 UNIT/ML IJ SOLN
4.0000 [IU] | Freq: Three times a day (TID) | INTRAMUSCULAR | Status: DC
  Administered 2020-08-11 – 2020-08-12 (×2): 4 [IU] via SUBCUTANEOUS

## 2020-08-11 MED ORDER — PENICILLIN G POTASSIUM 20000000 UNITS IJ SOLR
12.0000 10*6.[IU] | Freq: Two times a day (BID) | INTRAVENOUS | Status: DC
  Administered 2020-08-11 – 2020-08-15 (×8): 12 10*6.[IU] via INTRAVENOUS
  Filled 2020-08-11 (×9): qty 12

## 2020-08-11 MED ORDER — SORBITOL 70 % SOLN
30.0000 mL | Freq: Once | Status: AC
  Administered 2020-08-11: 30 mL via ORAL
  Filled 2020-08-11: qty 30

## 2020-08-11 MED ORDER — INSULIN GLARGINE 100 UNIT/ML ~~LOC~~ SOLN
24.0000 [IU] | Freq: Every day | SUBCUTANEOUS | Status: DC
  Administered 2020-08-11: 24 [IU] via SUBCUTANEOUS
  Filled 2020-08-11: qty 0.24

## 2020-08-11 MED ORDER — SENNOSIDES-DOCUSATE SODIUM 8.6-50 MG PO TABS
1.0000 | ORAL_TABLET | Freq: Two times a day (BID) | ORAL | Status: DC
  Administered 2020-08-11 – 2020-08-15 (×9): 1 via ORAL
  Filled 2020-08-11 (×10): qty 1

## 2020-08-11 MED ORDER — POLYETHYLENE GLYCOL 3350 17 G PO PACK
17.0000 g | PACK | Freq: Two times a day (BID) | ORAL | Status: DC
  Administered 2020-08-11 – 2020-08-15 (×5): 17 g via ORAL
  Filled 2020-08-11 (×9): qty 1

## 2020-08-11 NOTE — Progress Notes (Signed)
PROGRESS NOTE    Jessica Dennis  WUJ:[id number] DOB: May 04, 1932 DOA: 08/08/2020 PCP: Virgie Dad, MD (Confirm with patient/family/NH records and if not entered, this HAS to be entered at [facility] point of entry. "No PCP" if truly none.)   Chief Complaint  Patient presents with   Altered Mental Status    Brief Narrative: Patient is 87 year old female history of alcoholic liver cirrhosis, insulin-dependent diabetes mellitus type 2, diabetic neuropathy, diastolic CHF, hypertension, hyperlipidemia, hypothyroidism, GERD presented to the ED from assisted living facility after being found down on the bathroom floor.  Patient with complaints of generalized weakness and poor appetite.  Patient also had some complaints of vague generalized abdominal discomfort.  Patient went found by staff was tired and lethargic and noted to be febrile.  Patient noted to have a temp of 102.8 on EMS arrival to the facility patient also noted to be incontinent of urine.  Patient admitted noted to have recurrent fevers in the ED course and have multiple SIRS criteria with evidence of encephalopathy and concern for UTI.  CT abdomen and pelvis which was done unremarkable.  Patient noted to have a Streptococcus bacteremia as well as a Enterococcus avium UTI and initially was on IV Rocephin and transition to IV penicillin.  ID consultation pending.   Assessment & Plan:   Principal Problem:   Sepsis secondary to UTI [facility]) Active Problems:   Essential hypertension, benign   Hypothyroidism   GERD without esophagitis   Alcoholic cirrhosis of liver with ascites (HCC)   Chronic diastolic (congestive) heart failure (HCC)   Pancreatic cyst   Fall at home, initial encounter   Lactic acidosis   Mixed hyperlipidemia due to type 2 diabetes mellitus ([place])   Bacteremia due to Gram-positive bacteria   #1 sepsis secondary to Enterococcus Avium UTI and Streptococcus gordonii bacteremia -Patient admitted with multiple SIRS criteria  in the setting of initially suspected UTI with evidence of encephalopathy, lactic acidosis with concerns for developing sepsis. -Urinalysis not quite compelling for UTI however initially felt on admission to be likely etiology of patient's source of infection. -Chest x-ray done was negative for any evidence of pneumonia. -COVID-19 PCR negative. -Blood cultures obtained with Streptococcus gordonii and sensitive to penicillin, Rocephin, Levaquin, vancomycin, resistant to erythromycin. -Narrow IV antibiotics from Rocephin to IV penicillin. -Check a 2D echo to rule out endocarditis. -Consult with ID for antibiotic duration and recommendations.  2.  Enterococcus AVM UTI -Urine cultures with > 100,000 colonies of Enterococcus Avium sensitive to the ampicillin's, vancomycin, Bactrim.   -DC IV Rocephin and placed on IV penicillin.  3.  Probable bacteremia -Blood cultures growing Streptococcus gordonii with susceptibilities pending. -Continue IV Rocephin.  4.  Acute metabolic encephalopathy -Likely secondary to problems #1, 2, 3. -Slowly improving clinically. -Continue empiric IV antibiotics. -Follow.  5.  Lactic acidosis -Felt secondary to volume depletion and infection. -Lactic acid levels trending down.   -Continue IV antibiotics  .Saline lock IV fluids.   -Repeat labs in the morning.   6.  Hypertension -Toprol-XL  7.  Hypothyroidism -Continue Synthroid.  8.  Diabetes mellitus type 2 -Hemoglobin A1c 6.4. -CBG 215 this morning. -Continue to hold metformin, Januvia. -Increase Lantus to 14 units in the morning, 24 units at bedtime. -Continue SSI. -Place on meal coverage NovoLog 4 units 3 times daily with meals.  9.  History of alcoholic cirrhosis -No evidence of hepatic encephalopathy.   -Continue lactulose.   -Continue to hold spironolactone and Demadex.   -Follow.  10.  Chronic diastolic CHF -Patient with some noted lower extremity edema. -Diuretics on hold of  spironolactone and Demadex due to volume resuscitation and lactic acidosis. -Follow.  11.  Pancreatic cyst -Noted on CT. -Outpatient follow-up.  72.  Fall -PT/OT.  13.  GERD -Continue PPI    DVT prophylaxis: Lovenox Code Status: DNR Family Communication: Updated patient.  No family at bedside. Disposition:   Status is: Inpatient  Remains inpatient appropriate because:IV treatments appropriate due to intensity of illness or inability to take PO  Dispo: The patient is from: ALF friend's home              Anticipated d/c is to:  TBD              Patient currently is not medically stable to d/c.   Difficult to place patient No       Consultants:  None  Procedures:  CT abdomen and pelvis 08/09/2020 Chest x-ray 08/08/2020   Antimicrobials:  IV Rocephin 08/08/2020>>> 08/11/2020 IV penicillin 08/11/2020   Subjective: Sitting up in recliner.  Still with generalized weakness.  No chest pain.  No shortness of breath.  No abdominal pain.  Denies any dysuria.   Objective: Vitals:   08/10/20 1144 08/10/20 2012 08/11/20 0502 08/11/20 1217  BP: (!) 110/53 103/61 (!) 121/58 113/63  Pulse: 94 (!) 109 (!) 102 83  Resp: 18 16 14 20   Temp: 97.8 F (36.6 C) 97.9 F (36.6 C) 97.7 F (36.5 C) 98.2 F (36.8 C)  TempSrc:  Oral Oral Oral  SpO2: 98% 98% 98% 98%  Weight:      Height:        Intake/Output Summary (Last 24 hours) at 08/11/2020 1305 Last data filed at 08/11/2020 1240 Gross per 24 hour  Intake 1120 ml  Output 825 ml  Net 295 ml    Filed Weights   08/08/20 1742  Weight: 83.9 kg    Examination:  General exam: NAD Respiratory system: CTA B.  No wheezes, no crackles, no rhonchi.  Normal respiratory effort.   Cardiovascular system: RRR no murmurs rubs or gallops.  No JVD.  Trace bilateral lower extremity edema.  Gastrointestinal system: Abdomen is soft, nontender, nondistended, positive bowel sounds.  No rebound.  No guarding.  Central nervous system: Alert  and oriented. No focal neurological deficits. Extremities: Trace bilateral lower extremity edema. Skin: No rashes, lesions or ulcers Psychiatry: Judgement and insight appear normal. Mood & affect appropriate.     Data Reviewed: I have personally reviewed following labs and imaging studies  CBC: Recent Labs  Lab 08/05/20 1045 08/08/20 1748 08/09/20 0612 08/10/20 0413 08/11/20 0442  WBC 6.4 13.2* 8.6 6.2 3.7*  NEUTROABS 4.9 11.9* 7.8*  --   --   HGB 10.6* 10.3* 9.7* 9.1* 9.2*  HCT 31.8* 31.4* 29.6* 27.5* 28.6*  MCV 92.4 93.2 93.4 92.6 92.6  PLT 70* 66* 52* 42* 41*     Basic Metabolic Panel: Recent Labs  Lab 08/05/20 1045 08/08/20 1748 08/09/20 0612 08/10/20 0413 08/11/20 0442  NA 135 137 134* 134* 134*  K 4.2 4.1 4.2 3.9 3.9  CL 97* 100 100 101 102  CO2 28 22 22 22 24   GLUCOSE 287* 194* 206* 227* 231*  BUN 18 16 20 23 16   CREATININE 1.26* 1.50* 1.24* 0.90 0.82  CALCIUM 9.5 9.6 9.2 8.8* 8.7*  MG  --   --  1.9  --  2.0     GFR: Estimated Creatinine Clearance: 54.7 mL/min (  by C-G formula based on SCr of 0.82 mg/dL).  Liver Function Tests: Recent Labs  Lab 08/05/20 1045 08/08/20 1748 08/09/20 0612 08/10/20 0413 08/11/20 0442  AST 31 56* 234* 183* 152*  ALT 17 26 43 39 42  ALKPHOS 101 98 89 69 71  BILITOT 2.1* 2.7* 2.6* 1.6* 1.4*  PROT 5.6* 6.3* 5.9* 5.4* 5.2*  ALBUMIN 3.5 3.3* 3.1* 2.7* 2.6*     CBG: Recent Labs  Lab 08/10/20 1142 08/10/20 1633 08/10/20 2010 08/11/20 0722 08/11/20 1215  GLUCAP 307* 276* 304* 215* 319*      Recent Results (from the past 240 hour(s))  Blood culture (routine x 2)     Status: Abnormal   Collection Time: 08/08/20  5:50 PM   Specimen: BLOOD  Result Value Ref Range Status   Specimen Description   Final    BLOOD RIGHT ANTECUBITAL Performed at [facility], [place] [address], [place], [place] [address]    Special Requests   Final    BOTTLES DRAWN AEROBIC AND ANAEROBIC Blood Culture adequate  volume Performed at [place] [address] [place], [place] [address]    Culture  Setup Time   Final    GRAM POSITIVE COCCI IN CHAINS ANAEROBIC BOTTLE ONLY CRITICAL RESULT CALLED TO, READ BACK BY AND VERIFIED WITHGuadlupe Spanish PHARMD 1902 08/09/20 A BROWNING Performed at [facility], [place] [address], [place], [place] [address]    Culture STREPTOCOCCUS GORDONII (A)  Final   Report Status 08/11/2020 FINAL  Final   Organism ID, Bacteria STREPTOCOCCUS GORDONII  Final      Susceptibility   Streptococcus gordonii - MIC*    PENICILLIN <=0.06 SENSITIVE Sensitive     CEFTRIAXONE <=0.12 SENSITIVE Sensitive     ERYTHROMYCIN 4 RESISTANT Resistant     LEVOFLOXACIN 0.5 SENSITIVE Sensitive     VANCOMYCIN 0.5 SENSITIVE Sensitive     * STREPTOCOCCUS GORDONII  Blood Culture ID Panel (Reflexed)     Status: Abnormal   Collection Time: 08/08/20  5:50 PM  Result Value Ref Range Status   Enterococcus faecalis NOT DETECTED NOT DETECTED Final   Enterococcus Faecium NOT DETECTED NOT DETECTED Final   Listeria monocytogenes NOT DETECTED NOT DETECTED Final   Staphylococcus species NOT DETECTED NOT DETECTED Final   Staphylococcus aureus (BCID) NOT DETECTED NOT DETECTED Final   Staphylococcus epidermidis NOT DETECTED NOT DETECTED Final   Staphylococcus lugdunensis NOT DETECTED NOT DETECTED Final   Streptococcus species DETECTED (A) NOT DETECTED Final    Comment: Not Enterococcus species, Streptococcus agalactiae, Streptococcus pyogenes, or Streptococcus pneumoniae. CRITICAL RESULT CALLED TO, READ BACK BY AND VERIFIED WITH: N GLOGOVAC PHARMD 1902 08/09/20 A BROWNING    Streptococcus agalactiae NOT DETECTED NOT DETECTED Final   Streptococcus pneumoniae NOT DETECTED NOT DETECTED Final   Streptococcus pyogenes NOT DETECTED NOT DETECTED Final   A.calcoaceticus-baumannii NOT DETECTED NOT DETECTED Final   Bacteroides fragilis NOT DETECTED NOT DETECTED Final   Enterobacterales NOT  DETECTED NOT DETECTED Final   Enterobacter cloacae complex NOT DETECTED NOT DETECTED Final   Escherichia coli NOT DETECTED NOT DETECTED Final   Klebsiella aerogenes NOT DETECTED NOT DETECTED Final   Klebsiella oxytoca NOT DETECTED NOT DETECTED Final   Klebsiella pneumoniae NOT DETECTED NOT DETECTED Final   Proteus species NOT DETECTED NOT DETECTED Final   Salmonella species NOT DETECTED NOT DETECTED Final   Serratia marcescens NOT DETECTED NOT DETECTED Final   Haemophilus influenzae NOT DETECTED NOT DETECTED Final  Neisseria meningitidis NOT DETECTED NOT DETECTED Final   Pseudomonas aeruginosa NOT DETECTED NOT DETECTED Final   Stenotrophomonas maltophilia NOT DETECTED NOT DETECTED Final   Candida albicans NOT DETECTED NOT DETECTED Final   Candida auris NOT DETECTED NOT DETECTED Final   Candida glabrata NOT DETECTED NOT DETECTED Final   Candida krusei NOT DETECTED NOT DETECTED Final   Candida parapsilosis NOT DETECTED NOT DETECTED Final   Candida tropicalis NOT DETECTED NOT DETECTED Final   Cryptococcus neoformans/gattii NOT DETECTED NOT DETECTED Final    Comment: Performed at [facility], [place] [address] [place], [place] [address]  Blood culture (routine x 2)     Status: None (Preliminary result)   Collection Time: 08/08/20  5:55 PM   Specimen: BLOOD  Result Value Ref Range Status   Specimen Description   Final    BLOOD BLOOD RIGHT HAND Performed at [place] [address] [place], [place] [address]    Special Requests   Final    BOTTLES DRAWN AEROBIC AND ANAEROBIC Blood Culture adequate volume Performed at [place] [address], [place], [place] [address]    Culture   Final    NO GROWTH 1 DAY Performed at [facility], [place] [address] [place], [place] [address]    Report Status PENDING  Incomplete  Resp Panel by RT-PCR (Flu A&B, Covid) Nasopharyngeal Swab     Status: None   Collection Time: 08/08/20  9:01 PM    Specimen: Nasopharyngeal Swab; Nasopharyngeal(NP) swabs in vial transport medium  Result Value Ref Range Status   SARS Coronavirus 2 by RT PCR NEGATIVE NEGATIVE Final    Comment: (NOTE) SARS-CoV-2 target nucleic acids are NOT DETECTED.  The SARS-CoV-2 RNA is generally detectable in upper respiratory specimens during the acute phase of infection. The lowest concentration of SARS-CoV-2 viral copies this assay can detect is 138 copies/mL. A negative result does not preclude SARS-Cov-2 infection and should not be used as the sole basis for treatment or other patient management decisions. A negative result may occur with  improper specimen collection/handling, submission of specimen other than nasopharyngeal swab, presence of viral mutation(s) within the areas targeted by this assay, and inadequate number of viral copies(<138 copies/mL). A negative result must be combined with clinical observations, patient history, and epidemiological information. The expected result is Negative.  Fact Sheet for Patients:  EntrepreneurPulse.com.au  Fact Sheet for Healthcare Providers:  IncredibleEmployment.be  This test is no t yet approved or cleared by the [place] FDA and  has been authorized for detection and/or diagnosis of SARS-CoV-2 by FDA under an Emergency Use Authorization (EUA). This EUA will remain  in effect (meaning this test can be used) for the duration of the COVID-19 declaration under Section 564(b)(1) of the Act, 21 U.S.C.section 360bbb-3(b)(1), unless the authorization is terminated  or revoked sooner.       Influenza A by PCR NEGATIVE NEGATIVE Final   Influenza B by PCR NEGATIVE NEGATIVE Final    Comment: (NOTE) The Xpert Xpress SARS-CoV-2/FLU/RSV plus assay is intended as an aid in the diagnosis of influenza from Nasopharyngeal swab specimens and should not be used as a sole basis for treatment. Nasal washings and aspirates are  unacceptable for Xpert Xpress SARS-CoV-2/FLU/RSV testing.  Fact Sheet for Patients: EntrepreneurPulse.com.au  Fact Sheet for Healthcare Providers: IncredibleEmployment.be  This test is not yet approved or cleared by the [place] FDA and has been authorized for detection and/or diagnosis of SARS-CoV-2  by FDA under an Emergency Use Authorization (EUA). This EUA will remain in effect (meaning this test can be used) for the duration of the COVID-19 declaration under Section 564(b)(1) of the Act, 21 U.S.C. section 360bbb-3(b)(1), unless the authorization is terminated or revoked.  Performed at [facility], [place] [address], [place], [place] [address]   Urine Culture     Status: Abnormal   Collection Time: 08/09/20 12:19 AM   Specimen: Urine, Clean Catch  Result Value Ref Range Status   Specimen Description   Final    URINE, CLEAN CATCH Performed at [facility], [place] [address] [place], [place] [address]    Special Requests   Final    NONE Performed at [facility], [place] [address] [place], [place] [address]    Culture >=100,000 COLONIES/mL ENTEROCOCCUS AVIUM (A)  Final   Report Status 08/11/2020 FINAL  Final   Organism ID, Bacteria ENTEROCOCCUS AVIUM (A)  Final      Susceptibility   Enterococcus avium - MIC*    AMPICILLIN <=2 SENSITIVE Sensitive     NITROFURANTOIN 32 SENSITIVE Sensitive     VANCOMYCIN <=0.5 SENSITIVE Sensitive     * >=100,000 COLONIES/mL ENTEROCOCCUS AVIUM          Radiology Studies: No results found.      Scheduled Meds:  enoxaparin (LOVENOX) injection  30 mg Subcutaneous Q24H   insulin aspart  0-6 Units Subcutaneous TID AC & HS   insulin glargine  10 Units Subcutaneous q morning   insulin glargine  20 Units Subcutaneous QHS   lactulose  20 g Oral Daily   latanoprost  1 drop Both Eyes QHS   levothyroxine  75 mcg Oral Daily   metoprolol succinate   12.5 mg Oral Daily   pantoprazole  40 mg Oral Daily   simvastatin  20 mg Oral QHS   Continuous Infusions:  penicillin g continuous IV infusion       LOS: 2 days    Time spent: 35 minutes    Irine Seal, MD [facility]   To contact the attending provider between 7A-7P or the covering provider during after hours 7P-7A, please log into the web site [email or web address] and access using universal [place] password for that web site. If you do not have the password, please call the hospital operator.  08/11/2020, 1:05 PM

## 2020-08-11 NOTE — Progress Notes (Signed)
VAST consulted to assess pt's IV site in right [facility]. Unit nurse reported that patient's right arm has been red and swollen since this am, but seems slightly worse. The physician also looked at it earlier today. Patient instructed to keep arm elevated on pillow by unit RN. Assessed right AC PIV; site with old, dried blood beneath dressing. Line flushed easily with NS; no leaking, swelling, redness, or pain noted. Swollen, red area slightly distal and medial to current PIV site. Pt stated there is no pain or tenderness with palpation. Educated patient to contact nurse immediately if she experiences any continuous burning with IVF infusion. She verbalized understanding.  Advised unit RN that PIV dressing needs to be changed. Otherwise, IV site appears to be without issues. Advised that unit RN and later staff might measure swollen area of arm with tape measure to assess for any increased swelling.

## 2020-08-11 NOTE — TOC Progression Note (Signed)
Transition of Care [facility]) - Progression Note    Patient Details  Name: Jessica Dennis MRN: [id number] Date of Birth: [birth year]  [facility] CM/SW Contact  Peaches Vanoverbeke, Juliann Pulse, RN Phone Number: 08/11/2020, 1:56 PM  Clinical Narrative: Received call from [employer] rep Stephanie-need peer to peer w/Dr. Lynder Parents [phone]-Dr. Grandville Silos aware-will complete.      Expected Discharge Plan: Skilled Nursing Facility Barriers to Discharge: [facility]  Expected Discharge Plan and Services Expected Discharge Plan: [place]   Discharge Planning Services: CM Consult Post Acute Care Choice: [place] Living arrangements for the past 2 months: [place]                                       Social Determinants of Health (SDOH) Interventions    Readmission Risk Interventions No flowsheet data found.

## 2020-08-11 NOTE — Progress Notes (Signed)
Pt Sinus Tachy 100-105 nonsustained. MD Grandville Silos notified. Will continue to monitor and report HR >120 per MD.

## 2020-08-11 NOTE — Plan of Care (Signed)

## 2020-08-11 NOTE — TOC Progression Note (Signed)
Transition of Care [facility]) - Progression Note    Patient Details  Name: Jessica Dennis MRN: [id number] Date of Birth: [birth year]  Transition of Care [facility]) CM/SW Contact  Arelie Kuzel, Juliann Pulse, RN Phone Number: 08/11/2020, 10:24 AM  Clinical Narrative:  Awaiting auth from insurance-under med review. will need covid test once we have auth for [facility].PTAR auth received [phone].MD updated.     Expected Discharge Plan: Skilled Nursing Facility Barriers to Discharge: [employer] [facility]  Expected Discharge Plan and Services Expected Discharge Plan: [place]   Discharge Planning Services: CM Consult Post Acute Care Choice: Short Living arrangements for the past 2 months: [place]                                       Social Determinants of Health (SDOH) Interventions    Readmission Risk Interventions No flowsheet data found.

## 2020-08-12 ENCOUNTER — Inpatient Hospital Stay (HOSPITAL_COMMUNITY): Payer: HMO

## 2020-08-12 DIAGNOSIS — A419 Sepsis, unspecified organism: Secondary | ICD-10-CM

## 2020-08-12 DIAGNOSIS — N390 Urinary tract infection, site not specified: Secondary | ICD-10-CM

## 2020-08-12 DIAGNOSIS — R7881 Bacteremia: Secondary | ICD-10-CM | POA: Diagnosis not present

## 2020-08-12 LAB — ECHOCARDIOGRAM COMPLETE
Area-P 1/2: 2.96 cm2
Height: 66 in
S' Lateral: 1.9 cm
Weight: 2960 oz

## 2020-08-12 LAB — GLUCOSE, CAPILLARY
Glucose-Capillary: 230 mg/dL — ABNORMAL HIGH (ref 70–99)
Glucose-Capillary: 294 mg/dL — ABNORMAL HIGH (ref 70–99)
Glucose-Capillary: 201 mg/dL — ABNORMAL HIGH (ref 70–99)
Glucose-Capillary: 257 mg/dL — ABNORMAL HIGH (ref 70–99)

## 2020-08-12 LAB — CBC WITH DIFFERENTIAL/PLATELET
Abs Immature Granulocytes: 0.03 10*3/uL (ref 0.00–0.07)
Basophils Absolute: 0 10*3/uL (ref 0.0–0.1)
Basophils Relative: 0 %
Eosinophils Absolute: 0.1 10*3/uL (ref 0.0–0.5)
Eosinophils Relative: 4 %
HCT: 28.7 % — ABNORMAL LOW (ref 36.0–46.0)
Hemoglobin: 9.3 g/dL — ABNORMAL LOW (ref 12.0–15.0)
Immature Granulocytes: 1 %
Lymphocytes Relative: 17 %
Lymphs Abs: 0.5 10*3/uL — ABNORMAL LOW (ref 0.7–4.0)
MCH: 29.9 pg (ref 26.0–34.0)
MCHC: 32.4 g/dL (ref 30.0–36.0)
MCV: 92.3 fL (ref 80.0–100.0)
Monocytes Absolute: 0.3 10*3/uL (ref 0.1–1.0)
Monocytes Relative: 11 %
Neutro Abs: 2.2 10*3/uL (ref 1.7–7.7)
Neutrophils Relative %: 67 %
Platelets: 49 10*3/uL — ABNORMAL LOW (ref 150–400)
RBC: 3.11 MIL/uL — ABNORMAL LOW (ref 3.87–5.11)
RDW: 14.5 % (ref 11.5–15.5)
WBC: 3.2 10*3/uL — ABNORMAL LOW (ref 4.0–10.5)
nRBC: 0 % (ref 0.0–0.2)

## 2020-08-12 LAB — COMPREHENSIVE METABOLIC PANEL
ALT: 50 U/L — ABNORMAL HIGH (ref 0–44)
AST: 143 U/L — ABNORMAL HIGH (ref 15–41)
Albumin: 2.6 g/dL — ABNORMAL LOW (ref 3.5–5.0)
Alkaline Phosphatase: 98 U/L (ref 38–126)
Anion gap: 6 (ref 5–15)
BUN: 12 mg/dL (ref 8–23)
CO2: 24 mmol/L (ref 22–32)
Calcium: 8.6 mg/dL — ABNORMAL LOW (ref 8.9–10.3)
Chloride: 103 mmol/L (ref 98–111)
Creatinine, Ser: 0.83 mg/dL (ref 0.44–1.00)
GFR, Estimated: >60 mL/min (ref >60)
Glucose, Bld: 254 mg/dL — ABNORMAL HIGH (ref 70–99)
Potassium: 4.2 mmol/L (ref 3.5–5.1)
Sodium: 133 mmol/L — ABNORMAL LOW (ref 135–145)
Total Bilirubin: 1.6 mg/dL — ABNORMAL HIGH (ref 0.3–1.2)
Total Protein: 5.2 g/dL — ABNORMAL LOW (ref 6.5–8.1)

## 2020-08-12 MED ORDER — INSULIN ASPART 100 UNIT/ML IJ SOLN
6.0000 [IU] | Freq: Three times a day (TID) | INTRAMUSCULAR | Status: DC
  Administered 2020-08-12 – 2020-08-15 (×9): 6 [IU] via SUBCUTANEOUS

## 2020-08-12 MED ORDER — INSULIN GLARGINE 100 UNIT/ML ~~LOC~~ SOLN
18.0000 [IU] | Freq: Every morning | SUBCUTANEOUS | Status: DC
  Administered 2020-08-12: 18 [IU] via SUBCUTANEOUS
  Filled 2020-08-12 (×2): qty 0.18

## 2020-08-12 MED ORDER — INSULIN GLARGINE 100 UNIT/ML ~~LOC~~ SOLN
28.0000 [IU] | Freq: Every day | SUBCUTANEOUS | Status: DC
  Administered 2020-08-13: 28 [IU] via SUBCUTANEOUS
  Filled 2020-08-12: qty 0.28

## 2020-08-12 NOTE — Progress Notes (Signed)
Physical Therapy Treatment Patient Details Name: Jessica Dennis MRN: [id number] DOB: [birth year] Today's Date: 08/12/2020    History of Present Illness 87 year old female with past medical history of alcoholic liver cirrhosis, insulin-dependent diabetes mellitus type 2, diabetic neuropathy, diastolic congestive heart failure , hypertension, hyperlipidemia, hypothyroidism, gastroesophageal reflux disease who presents to ED , brought in by EMS from [facility]'s home assisted living facility due to being found down on the bathroom floor. Patient was noted to have fever,multiple sirs criteria with clinical evidence of encephalopathy and suspected urinary tract infection all concerning for developing sepsis,    PT Comments    Pt declined mobility stating she had been OOB today (at least to [facility]).  Pt also politely declined even sitting EOB however was agreeable to perform LE exercises in bed.  Pt assisted with exercises in supine and the repositioned to comfort.    Follow Up Recommendations  SNF     Equipment Recommendations  None recommended by PT    Recommendations for Other Services       Precautions / Restrictions Precautions Precautions: Fall Precaution Comments: incontinence, use depends/ pad and panty    Mobility  Bed Mobility               General bed mobility comments: pt declined mobility    Transfers                    Ambulation/Gait                 Stairs             Wheelchair Mobility    Modified Rankin (Stroke Patients Only)       Balance                                            Cognition Arousal/Alertness: Awake/alert Behavior During Therapy: WFL for tasks assessed/performed Overall Cognitive Status: Within Functional Limits for tasks assessed                                        Exercises General Exercises - Lower Extremity Ankle Circles/Pumps: AROM;Both;10 reps Quad Sets:  AROM;Both;10 reps Short Arc Quad: AROM;Both;10 reps Heel Slides: AAROM;Both;10 reps Hip ABduction/ADduction: AROM;Both;10 reps    General Comments        Pertinent Vitals/Pain Pain Assessment: Faces Faces Pain Scale: Hurts little more Pain Location: right knee with movement Pain Descriptors / Indicators: Discomfort Pain Intervention(s): Repositioned;Monitored during session    Home Living                      Prior Function            PT Goals (current goals can now be found in the care plan section) Progress towards PT goals: Progressing toward goals    Frequency    Min 2X/week      PT Plan Current plan remains appropriate    Co-evaluation              AM-PAC PT "6 Clicks" Mobility   Outcome Measure  Help needed turning from your back to your side while in a flat bed without using bedrails?: A Little Help needed moving from lying on your back to sitting on the side of a flat  bed without using bedrails?: A Little Help needed moving to and from a bed to a chair (including a wheelchair)?: A Little Help needed standing up from a chair using your arms (e.g., wheelchair or bedside chair)?: A Little Help needed to walk in hospital room?: A Lot Help needed climbing 3-5 steps with a railing? : Total 6 Click Score: 15    End of Session   Activity Tolerance: Patient tolerated treatment well Patient left: in bed;with bed alarm set;with call bell/phone within reach Nurse Communication: Mobility status PT Visit Diagnosis: Muscle weakness (generalized) (M62.81)     Time: [id number] PT Time Calculation (min) (ACUTE ONLY): 15 min  Charges:  $Therapeutic Exercise: 8-22 mins                    Jannette Spanner PT, DPT Acute Rehabilitation Services Pager: [phone] Office: [phone]  York Ram E 08/12/2020, 3:08 PM

## 2020-08-12 NOTE — Plan of Care (Signed)
  Problem: Clinical Measurements: Goal: Ability to maintain clinical measurements within normal limits will improve Outcome: Progressing Goal: Will remain free from infection Outcome: Progressing  Problem: Activity: Goal: Risk for activity intolerance will decrease Outcome: Progressing   Problem: Skin Integrity: Goal: Risk for impaired skin integrity will decrease Outcome: Progressing   Problem: Safety: Goal: Ability to remain free from injury will improve Outcome: Progressing

## 2020-08-12 NOTE — Consult Note (Signed)
[name]       Reason for Consult:bacteremia    Referring Physician: Dr. [place] Silos  Principal Problem:   Sepsis secondary to UTI [facility]) Active Problems:   Essential hypertension, benign   Hypothyroidism   GERD without esophagitis   Alcoholic cirrhosis of liver with ascites ([place])   Chronic diastolic (congestive) heart failure (HCC)   Pancreatic cyst   Fall at home, initial encounter   Lactic acidosis   Mixed hyperlipidemia due to type 2 diabetes mellitus ([place])   Bacteremia due to Gram-positive bacteria    enoxaparin (LOVENOX) injection  30 mg Subcutaneous Q24H   insulin aspart  0-6 Units Subcutaneous TID AC & HS   insulin aspart  6 Units Subcutaneous TID WC   insulin glargine  18 Units Subcutaneous q morning   insulin glargine  28 Units Subcutaneous QHS   lactulose  20 g Oral Daily   latanoprost  1 drop Both Eyes QHS   levothyroxine  75 mcg Oral Daily   metoprolol succinate  12.5 mg Oral Daily   pantoprazole  40 mg Oral Daily   polyethylene glycol  17 g Oral BID   senna-docusate  1 tablet Oral BID   simvastatin  20 mg Oral QHS    Recommendations:  Continue penicillin Amoxicillin 500 mg three times a day through 08/14/20.   No indication for TEE  Assessment: She came in via EMS after being found down and with a fever on presentation.  No urinary complaints and UA not supportive or urinary source, no other localizing signs.  One positive blood culture with Strep gordonii may be the culprit.  She is improving on antibiotic treatment for this so will continue for 7 days total.    I will otherwise sign off, call with any questions  Antibiotics: Day 5 total antibiotics Day 2 penicillin  HPI: Jessica Dennis is a 87 y.o. female with alcoholic cirrhosis, diabetes, CHF, neuropahty who was found down at her assisted living facility.  She reports she was not having any fever or chills prior to the event.  She has not had any recent dysuria,  urinary frequency, diarrhea, joint swelling, cough or shortness of breath.  She feels better compared to admission.  Tmax of 101.8, WBC wnl.     Review of Systems:  Constitutional: negative for fevers, chills, sweats, and anorexia Respiratory: negative for cough or sputum Gastrointestinal: negative for nausea and diarrhea Genitourinary: negative for frequency and dysuria Integument/breast: negative for rash Musculoskeletal: negative for myalgias and arthralgias All other systems reviewed and are negative    Past Medical History:  Diagnosis Date   Anemia    Anemia of chronic renal failure, stage 3 (moderate) ([place]) 03/24/2020   Angiodysplasia of ascending colon 10/25/2014   Anxiety    Arthritis    Borderline diabetes    Cancer of the skin, basal cell 09/03/2012   Cirrhosis ([place])    Diabetes mellitus without complication ([place])    Diverticular disease    GAVE (gastric antral vascular ectasia) 09/09/2017   GERD (gastroesophageal reflux disease)    Hypertension    Iron deficiency anemia due to chronic blood loss 01/19/2015   Portal hypertensive gastropathy ([place]) 08/02/2016   ? Some GAVE also   Thyroid disease     Social History   Tobacco Use   Smoking status: Never   Smokeless tobacco: Never  Vaping Use   Vaping Use: Never used  Substance Use Topics   Alcohol use: Yes  Comment: weekly   Drug use: No    Family History  Problem Relation Age of Onset   Bladder Cancer Father    Heart attack Father    Diabetes Mother    Colon cancer Neg Hx    Colon polyps Neg Hx    Esophageal cancer Neg Hx    Rectal cancer Neg Hx    Stomach cancer Neg Hx     Allergies  Allergen Reactions   Tizanidine Other (See Comments)    Hallucinate, confused     Physical Exam: Constitutional: in no apparent distress  Vitals:   08/12/20 0536 08/12/20 1335  BP: 112/62 122/60  Pulse: (!) 106 (!) 102  Resp: 14 14  Temp: 97.7 F (36.5 C) 98.3 F (36.8 C)  SpO2: 99% 98%   EYES:  anicteric Cardiovascular: Cor RRR Respiratory: clear; GI: soft Musculoskeletal: no pedal edema noted Skin: dry skin over face Neuro: alert  Lab Results  Component Value Date   WBC 3.2 (L) 08/12/2020   HGB 9.3 (L) 08/12/2020   HCT 28.7 (L) 08/12/2020   MCV 92.3 08/12/2020   PLT 49 (L) 08/12/2020    Lab Results  Component Value Date   CREATININE 0.83 08/12/2020   BUN 12 08/12/2020   NA 133 (L) 08/12/2020   K 4.2 08/12/2020   CL 103 08/12/2020   CO2 24 08/12/2020    Lab Results  Component Value Date   ALT 50 (H) 08/12/2020   AST 143 (H) 08/12/2020   ALKPHOS 98 08/12/2020     Microbiology: Recent Results (from the past 240 hour(s))  Blood culture (routine x 2)     Status: Abnormal   Collection Time: 08/08/20  5:50 PM   Specimen: BLOOD  Result Value Ref Range Status   Specimen Description   Final    BLOOD RIGHT ANTECUBITAL Performed at [facility], [place] [address] [place], [place] [address]    Special Requests   Final    BOTTLES DRAWN AEROBIC AND ANAEROBIC Blood Culture adequate volume Performed at [facility] [address], [place], [place] [address]    Culture  Setup Time   Final    GRAM POSITIVE COCCI IN CHAINS ANAEROBIC BOTTLE ONLY CRITICAL RESULT CALLED TO, READ BACK BY AND VERIFIED WITHGuadlupe Spanish PHARMD 1902 08/09/20 A BROWNING Performed at [facility], [place] [address] [place], [place] [address]    Culture STREPTOCOCCUS GORDONII (A)  Final   Report Status 08/11/2020 FINAL  Final   Organism ID, Bacteria STREPTOCOCCUS GORDONII  Final      Susceptibility   Streptococcus gordonii - MIC*    PENICILLIN <=0.06 SENSITIVE Sensitive     CEFTRIAXONE <=0.12 SENSITIVE Sensitive     ERYTHROMYCIN 4 RESISTANT Resistant     LEVOFLOXACIN 0.5 SENSITIVE Sensitive     VANCOMYCIN 0.5 SENSITIVE Sensitive     * STREPTOCOCCUS GORDONII  Blood Culture ID Panel (Reflexed)     Status: Abnormal   Collection Time: 08/08/20  5:50 PM   Result Value Ref Range Status   Enterococcus faecalis NOT DETECTED NOT DETECTED Final   Enterococcus Faecium NOT DETECTED NOT DETECTED Final   Listeria monocytogenes NOT DETECTED NOT DETECTED Final   Staphylococcus species NOT DETECTED NOT DETECTED Final   Staphylococcus aureus (BCID) NOT DETECTED NOT DETECTED Final   Staphylococcus epidermidis NOT DETECTED NOT DETECTED Final   Staphylococcus lugdunensis NOT DETECTED NOT DETECTED Final   Streptococcus species DETECTED (A) NOT DETECTED Final  Comment: Not Enterococcus species, Streptococcus agalactiae, Streptococcus pyogenes, or Streptococcus pneumoniae. CRITICAL RESULT CALLED TO, READ BACK BY AND VERIFIED WITH: N GLOGOVAC PHARMD 1902 08/09/20 A BROWNING    Streptococcus agalactiae NOT DETECTED NOT DETECTED Final   Streptococcus pneumoniae NOT DETECTED NOT DETECTED Final   Streptococcus pyogenes NOT DETECTED NOT DETECTED Final   A.calcoaceticus-baumannii NOT DETECTED NOT DETECTED Final   Bacteroides fragilis NOT DETECTED NOT DETECTED Final   Enterobacterales NOT DETECTED NOT DETECTED Final   Enterobacter cloacae complex NOT DETECTED NOT DETECTED Final   Escherichia coli NOT DETECTED NOT DETECTED Final   Klebsiella aerogenes NOT DETECTED NOT DETECTED Final   Klebsiella oxytoca NOT DETECTED NOT DETECTED Final   Klebsiella pneumoniae NOT DETECTED NOT DETECTED Final   Proteus species NOT DETECTED NOT DETECTED Final   Salmonella species NOT DETECTED NOT DETECTED Final   Serratia marcescens NOT DETECTED NOT DETECTED Final   Haemophilus influenzae NOT DETECTED NOT DETECTED Final   Neisseria meningitidis NOT DETECTED NOT DETECTED Final   Pseudomonas aeruginosa NOT DETECTED NOT DETECTED Final   Stenotrophomonas maltophilia NOT DETECTED NOT DETECTED Final   Candida albicans NOT DETECTED NOT DETECTED Final   Candida auris NOT DETECTED NOT DETECTED Final   Candida glabrata NOT DETECTED NOT DETECTED Final   Candida krusei NOT DETECTED NOT  DETECTED Final   Candida parapsilosis NOT DETECTED NOT DETECTED Final   Candida tropicalis NOT DETECTED NOT DETECTED Final   Cryptococcus neoformans/gattii NOT DETECTED NOT DETECTED Final    Comment: Performed at [facility], [address] [place], [place] [address]  Blood culture (routine x 2)     Status: None (Preliminary result)   Collection Time: 08/08/20  5:55 PM   Specimen: BLOOD  Result Value Ref Range Status   Specimen Description   Final    BLOOD BLOOD RIGHT HAND Performed at [facility] [address], [place], [place] [address]    Special Requests   Final    BOTTLES DRAWN AEROBIC AND ANAEROBIC Blood Culture adequate volume Performed at [facility] [address], [place], [place] [address]    Culture   Final    NO GROWTH 3 DAYS Performed at [facility], [place] [address], [place], [place] [address]    Report Status PENDING  Incomplete  Resp Panel by RT-PCR (Flu A&B, Covid) Nasopharyngeal Swab     Status: None   Collection Time: 08/08/20  9:01 PM   Specimen: Nasopharyngeal Swab; Nasopharyngeal(NP) swabs in vial transport medium  Result Value Ref Range Status   SARS Coronavirus 2 by RT PCR NEGATIVE NEGATIVE Final    Comment: (NOTE) SARS-CoV-2 target nucleic acids are NOT DETECTED.  The SARS-CoV-2 RNA is generally detectable in upper respiratory specimens during the acute phase of infection. The lowest concentration of SARS-CoV-2 viral copies this assay can detect is 138 copies/mL. A negative result does not preclude SARS-Cov-2 infection and should not be used as the sole basis for treatment or other patient management decisions. A negative result may occur with  improper specimen collection/handling, submission of specimen other than nasopharyngeal swab, presence of viral mutation(s) within the areas targeted by this assay, and inadequate number of viral copies(<138 copies/mL). A negative result must be combined  with clinical observations, patient history, and epidemiological information. The expected result is Negative.  Fact Sheet for Patients:  EntrepreneurPulse.com.au  Fact Sheet for Healthcare Providers:  IncredibleEmployment.be  This test is no t yet approved or cleared by the [place]  States FDA and  has been authorized for detection and/or diagnosis of SARS-CoV-2 by FDA under an Emergency Use Authorization (EUA). This EUA will remain  in effect (meaning this test can be used) for the duration of the COVID-19 declaration under Section 564(b)(1) of the Act, 21 U.S.C.section 360bbb-3(b)(1), unless the authorization is terminated  or revoked sooner.       Influenza A by PCR NEGATIVE NEGATIVE Final   Influenza B by PCR NEGATIVE NEGATIVE Final    Comment: (NOTE) The Xpert Xpress SARS-CoV-2/FLU/RSV plus assay is intended as an aid in the diagnosis of influenza from Nasopharyngeal swab specimens and should not be used as a sole basis for treatment. Nasal washings and aspirates are unacceptable for Xpert Xpress SARS-CoV-2/FLU/RSV testing.  Fact Sheet for Patients: EntrepreneurPulse.com.au  Fact Sheet for Healthcare Providers: IncredibleEmployment.be  This test is not yet approved or cleared by the [place] FDA and has been authorized for detection and/or diagnosis of SARS-CoV-2 by FDA under an Emergency Use Authorization (EUA). This EUA will remain in effect (meaning this test can be used) for the duration of the COVID-19 declaration under Section 564(b)(1) of the Act, 21 U.S.C. section 360bbb-3(b)(1), unless the authorization is terminated or revoked.  Performed at [facility], [place] [address] [place], [place] [address]   Urine Culture     Status: Abnormal   Collection Time: 08/09/20 12:19 AM   Specimen: Urine, Clean Catch  Result Value Ref Range Status   Specimen Description   Final     URINE, CLEAN CATCH Performed at [facility], [place] [address] [place], [place] [address]    Special Requests   Final    NONE Performed at [facility], [place] [address] [place],  [address]    Culture >=100,000 COLONIES/mL ENTEROCOCCUS AVIUM (A)  Final   Report Status 08/11/2020 FINAL  Final   Organism ID, Bacteria ENTEROCOCCUS AVIUM (A)  Final      Susceptibility   Enterococcus avium - MIC*    AMPICILLIN <=2 SENSITIVE Sensitive     NITROFURANTOIN 32 SENSITIVE Sensitive     VANCOMYCIN <=0.5 SENSITIVE Sensitive     * >=100,000 COLONIES/mL ENTEROCOCCUS AVIUM    Thayer Headings, [facility] for Infectious Disease [place] [email or web address] 08/12/2020, 3:05 PM

## 2020-08-12 NOTE — Plan of Care (Signed)

## 2020-08-12 NOTE — TOC Progression Note (Signed)
Transition of Care [facility]) - Progression Note    Patient Details  Name: Jessica Dennis MRN: [id number] Date of Birth: 11-30-1932  [facility] CM/SW Contact  Cheresa Siers, Juliann Pulse, RN Phone Number: 08/12/2020, 3:28 PM  Clinical Narrative: approved for SNF-[facility] per insurance [phone] for 7 days;PTAR approved [phone]. Per [facility] SNF rep Karlene Einstein can come today anytime-cannot accept over weekend.MD updated.    Expected Discharge Plan: [place] Barriers to Discharge: Continued Medical Work up  Expected Discharge Plan and Services Expected Discharge Plan: [place]   Discharge Planning Services: CM Consult Post Acute Care Choice: [place] Living arrangements for the past 2 months: [place]                                       Social Determinants of Health (SDOH) Interventions    Readmission Risk Interventions No flowsheet data found.

## 2020-08-12 NOTE — Progress Notes (Addendum)
PROGRESS NOTE    Jessica Dennis  [id number] DOB: 04-04-1932 DOA: 08/08/2020 PCP: Virgie Dad, MD    Chief Complaint  Patient presents with   Altered Mental Status    Brief Narrative: Patient is 87 year old female history of alcoholic liver cirrhosis, insulin-dependent diabetes mellitus type 2, diabetic neuropathy, diastolic CHF, hypertension, hyperlipidemia, hypothyroidism, GERD presented to the ED from assisted living facility after being found down on the bathroom floor.  Patient with complaints of generalized weakness and poor appetite.  Patient also had some complaints of vague generalized abdominal discomfort.  Patient went found by staff was tired and lethargic and noted to be febrile.  Patient noted to have a temp of 102.8 on EMS arrival to the facility patient also noted to be incontinent of urine.  Patient admitted noted to have recurrent fevers in the ED course and have multiple SIRS criteria with evidence of encephalopathy and concern for UTI.  CT abdomen and pelvis which was done unremarkable.  Patient noted to have a Streptococcus bacteremia as well as a Enterococcus avium UTI and initially was on IV Rocephin and transition to IV penicillin.  ID consultation pending.   Assessment & Plan:   Principal Problem:   Sepsis secondary to UTI [facility]) Active Problems:   Essential hypertension, benign   Hypothyroidism   GERD without esophagitis   Alcoholic cirrhosis of liver with ascites (HCC)   Chronic diastolic (congestive) heart failure (HCC)   Pancreatic cyst   Fall at home, initial encounter   Lactic acidosis   Mixed hyperlipidemia due to type 2 diabetes mellitus ([place])   Bacteremia due to Gram-positive bacteria   1 sepsis secondary to Enterococcus Avium UTI and Streptococcus gordonii bacteremia -Patient admitted with multiple SIRS criteria in the setting of initially suspected UTI with evidence of encephalopathy, lactic acidosis with concerns for developing  sepsis. -Urinalysis not quite compelling for UTI however initially felt on admission to be likely etiology of patient's source of infection. -Chest x-ray done was negative for any evidence of pneumonia. -COVID-19 PCR negative. -Blood cultures obtained with Streptococcus gordonii and sensitive to penicillin, Rocephin, Levaquin, vancomycin, resistant to erythromycin. -2D echo ordered yesterday however patient seem to have refused.  -IV Rocephin has been narrowed to IV penicillin.   -Consult with ID for antibiotic recommendations and duration and further evaluation and management.  2.  Enterococcus Avium UTI -Urine cultures with > 100,000 colonies of Enterococcus Avium sensitive to the ampicillin's, vancomycin, Bactrim.   -IV Rocephin has been narrowed to IV penicillin.  3.  Probable Streptococcus bacteremia -Blood cultures growing Streptococcus gordonii with resistance to erythromycin, sensitive to penicillin, Rocephin, Levaquin, vancomycin.   -2D echo ordered however patient refused yesterday. -IV Rocephin has been narrowed to IV penicillin.  -Consult with ID for further evaluation and management.   4.  Acute metabolic encephalopathy -Likely secondary to problems #1, 2, 3. -Improving clinically.   -Continue empiric IV antibiotics.   -Follow.    5.  Lactic acidosis -Felt secondary to volume depletion and infection. -Lactic acid levels trending down.   -Continue IV antibiotics  .Saline lock IV fluids.   -Repeat labs in the morning.   6.  Hypertension -Continue Toprol-XL  7.  Hypothyroidism -Synthroid  8.  Diabetes mellitus type 2 -Hemoglobin A1c 6.4. -CBG 230 this morning . -Continue to hold Januvia, metformin.   -Increase Lantus to 18 units in the morning, 28 units at bedtime.  -SSI.  -Increase meal coverage NovoLog 6 units 3 times daily with meals.  9.  History of alcoholic cirrhosis -No evidence of hepatic encephalopathy.   -Lactulose.   -Continue to hold  spironolactone and Demadex.   -Follow.  10.  Chronic diastolic CHF -Patient with some noted lower extremity edema which has improved. -Diuretics on hold of spironolactone and Demadex due to volume resuscitation and lactic acidosis. -May need to resume diuretics at half home dose when clinically improved oral on discharge. -Follow.  11.  Pancreatic cyst -Noted on CT. -Outpatient follow-up.  36.  Fall -PT/OT.  13.  GERD -PPI.    DVT prophylaxis: SCDs Code Status: DNR Family Communication: Updated patient.  No family at bedside. Disposition:   Status is: Inpatient  Remains inpatient appropriate because:IV treatments appropriate due to intensity of illness or inability to take PO  Dispo: The patient is from: ALF friend's home              Anticipated d/c is to: SNF              Patient currently is not medically stable to d/c.   Difficult to place patient No       Consultants:  ID pending  Procedures:  CT abdomen and pelvis 08/09/2020 Chest x-ray 08/08/2020   Antimicrobials:  IV Rocephin 08/08/2020>>> 08/11/2020 IV penicillin 08/11/2020>>>>>>   Subjective: Sleeping but arousable.  Still with generalized weakness.  No chest pain.  No shortness of breath.  No abdominal pain.    Objective: Vitals:   08/11/20 0502 08/11/20 1217 08/11/20 2028 08/12/20 0536  BP: (!) 121/58 113/63 (!) 108/52 112/62  Pulse: (!) 102 83 100 (!) 106  Resp: '14 20 16 14  '$ Temp: 97.7 F (36.5 C) 98.2 F (36.8 C) 98.5 F (36.9 C) 97.7 F (36.5 C)  TempSrc: Oral Oral Oral Oral  SpO2: 98% 98% 97% 99%  Weight:      Height:        Intake/Output Summary (Last 24 hours) at 08/12/2020 1209 Last data filed at 08/12/2020 0950 Gross per 24 hour  Intake 1391.36 ml  Output 500 ml  Net 891.36 ml    Filed Weights   08/08/20 1742  Weight: 83.9 kg    Examination:  General exam: NAD. Respiratory system: Lungs clear to auscultation bilaterally.  No wheezes, no crackles, no rhonchi.  Normal  respiratory effort.  Cardiovascular system: Regular rate rhythm no murmurs rubs or gallops.  No JVD. Trace-1 + lower extremity edema.   Gastrointestinal system: Abdomen is soft, nontender, nondistended, positive bowel sounds.  No rebound.  No guarding. Central nervous system: Alert and oriented. No focal neurological deficits. Extremities:Trace - 1+ lower extremity edema. Skin: No rashes, lesions or ulcers Psychiatry: Judgement and insight appear normal. Mood & affect appropriate.     Data Reviewed: I have personally reviewed following labs and imaging studies  CBC: Recent Labs  Lab 08/08/20 1748 08/09/20 0612 08/10/20 0413 08/11/20 0442 08/12/20 0424  WBC 13.2* 8.6 6.2 3.7* 3.2*  NEUTROABS 11.9* 7.8*  --   --  2.2  HGB 10.3* 9.7* 9.1* 9.2* 9.3*  HCT 31.4* 29.6* 27.5* 28.6* 28.7*  MCV 93.2 93.4 92.6 92.6 92.3  PLT 66* 52* 42* 41* 49*     Basic Metabolic Panel: Recent Labs  Lab 08/08/20 1748 08/09/20 0612 08/10/20 0413 08/11/20 0442 08/12/20 0424  NA 137 134* 134* 134* 133*  K 4.1 4.2 3.9 3.9 4.2  CL 100 100 101 102 103  CO2 '22 22 22 24 24  '$ GLUCOSE 194* 206* 227* 231* 254*  BUN  $'16 20 23 16 12  'U$ CREATININE 1.50* 1.24* 0.90 0.82 0.83  CALCIUM 9.6 9.2 8.8* 8.7* 8.6*  MG  --  1.9  --  2.0  --      GFR: Estimated Creatinine Clearance: 54.1 mL/min (by C-G formula based on SCr of 0.83 mg/dL).  Liver Function Tests: Recent Labs  Lab 08/08/20 1748 08/09/20 0612 08/10/20 0413 08/11/20 0442 08/12/20 0424  AST 56* 234* 183* 152* 143*  ALT 26 43 39 42 50*  ALKPHOS 98 89 69 71 98  BILITOT 2.7* 2.6* 1.6* 1.4* 1.6*  PROT 6.3* 5.9* 5.4* 5.2* 5.2*  ALBUMIN 3.3* 3.1* 2.7* 2.6* 2.6*     CBG: Recent Labs  Lab 08/11/20 1215 08/11/20 1751 08/11/20 2024 08/12/20 0721 08/12/20 1129  GLUCAP 319* 290* 277* 230* 294*      Recent Results (from the past 240 hour(s))  Blood culture (routine x 2)     Status: Abnormal   Collection Time: 08/08/20  5:50 PM   Specimen:  BLOOD  Result Value Ref Range Status   Specimen Description   Final    BLOOD RIGHT ANTECUBITAL Performed at [facility], [place] [address] [place], [place] [address]    Special Requests   Final    BOTTLES DRAWN AEROBIC AND ANAEROBIC Blood Culture adequate volume Performed at [place] [address], [place], [place] [address]    Culture  Setup Time   Final    GRAM POSITIVE COCCI IN CHAINS ANAEROBIC BOTTLE ONLY CRITICAL RESULT CALLED TO, READ BACK BY AND VERIFIED WITHGuadlupe Spanish PHARMD 1902 08/09/20 A BROWNING Performed at [facility], [place] [address] [place], [place] [address]    Culture STREPTOCOCCUS GORDONII (A)  Final   Report Status 08/11/2020 FINAL  Final   Organism ID, Bacteria STREPTOCOCCUS GORDONII  Final      Susceptibility   Streptococcus gordonii - MIC*    PENICILLIN <=0.06 SENSITIVE Sensitive     CEFTRIAXONE <=0.12 SENSITIVE Sensitive     ERYTHROMYCIN 4 RESISTANT Resistant     LEVOFLOXACIN 0.5 SENSITIVE Sensitive     VANCOMYCIN 0.5 SENSITIVE Sensitive     * STREPTOCOCCUS GORDONII  Blood Culture ID Panel (Reflexed)     Status: Abnormal   Collection Time: 08/08/20  5:50 PM  Result Value Ref Range Status   Enterococcus faecalis NOT DETECTED NOT DETECTED Final   Enterococcus Faecium NOT DETECTED NOT DETECTED Final   Listeria monocytogenes NOT DETECTED NOT DETECTED Final   Staphylococcus species NOT DETECTED NOT DETECTED Final   Staphylococcus aureus (BCID) NOT DETECTED NOT DETECTED Final   Staphylococcus epidermidis NOT DETECTED NOT DETECTED Final   Staphylococcus lugdunensis NOT DETECTED NOT DETECTED Final   Streptococcus species DETECTED (A) NOT DETECTED Final    Comment: Not Enterococcus species, Streptococcus agalactiae, Streptococcus pyogenes, or Streptococcus pneumoniae. CRITICAL RESULT CALLED TO, READ BACK BY AND VERIFIED WITH: N GLOGOVAC PHARMD 1902 08/09/20 A BROWNING    Streptococcus agalactiae NOT DETECTED  NOT DETECTED Final   Streptococcus pneumoniae NOT DETECTED NOT DETECTED Final   Streptococcus pyogenes NOT DETECTED NOT DETECTED Final   A.calcoaceticus-baumannii NOT DETECTED NOT DETECTED Final   Bacteroides fragilis NOT DETECTED NOT DETECTED Final   Enterobacterales NOT DETECTED NOT DETECTED Final   Enterobacter cloacae complex NOT DETECTED NOT DETECTED Final   Escherichia coli NOT DETECTED NOT DETECTED Final   Klebsiella aerogenes NOT DETECTED NOT DETECTED Final   Klebsiella oxytoca NOT DETECTED NOT DETECTED Final   Klebsiella pneumoniae NOT  DETECTED NOT DETECTED Final   Proteus species NOT DETECTED NOT DETECTED Final   Salmonella species NOT DETECTED NOT DETECTED Final   Serratia marcescens NOT DETECTED NOT DETECTED Final   Haemophilus influenzae NOT DETECTED NOT DETECTED Final   Neisseria meningitidis NOT DETECTED NOT DETECTED Final   Pseudomonas aeruginosa NOT DETECTED NOT DETECTED Final   Stenotrophomonas maltophilia NOT DETECTED NOT DETECTED Final   Candida albicans NOT DETECTED NOT DETECTED Final   Candida auris NOT DETECTED NOT DETECTED Final   Candida glabrata NOT DETECTED NOT DETECTED Final   Candida krusei NOT DETECTED NOT DETECTED Final   Candida parapsilosis NOT DETECTED NOT DETECTED Final   Candida tropicalis NOT DETECTED NOT DETECTED Final   Cryptococcus neoformans/gattii NOT DETECTED NOT DETECTED Final    Comment: Performed at [facility], [place] [address] [place], [place] [address]  Blood culture (routine x 2)     Status: None (Preliminary result)   Collection Time: 08/08/20  5:55 PM   Specimen: BLOOD  Result Value Ref Range Status   Specimen Description   Final    BLOOD BLOOD RIGHT HAND Performed at [place] [address], [place], [place] [address]    Special Requests   Final    BOTTLES DRAWN AEROBIC AND ANAEROBIC Blood Culture adequate volume Performed at [facility] [address], [place], [place]  [address]    Culture   Final    NO GROWTH 3 DAYS Performed at [facility], [place] [address], [place], [place] [address]    Report Status PENDING  Incomplete  Resp Panel by RT-PCR (Flu A&B, Covid) Nasopharyngeal Swab     Status: None   Collection Time: 08/08/20  9:01 PM   Specimen: Nasopharyngeal Swab; Nasopharyngeal(NP) swabs in vial transport medium  Result Value Ref Range Status   SARS Coronavirus 2 by RT PCR NEGATIVE NEGATIVE Final    Comment: (NOTE) SARS-CoV-2 target nucleic acids are NOT DETECTED.  The SARS-CoV-2 RNA is generally detectable in upper respiratory specimens during the acute phase of infection. The lowest concentration of SARS-CoV-2 viral copies this assay can detect is 138 copies/mL. A negative result does not preclude SARS-Cov-2 infection and should not be used as the sole basis for treatment or other patient management decisions. A negative result may occur with  improper specimen collection/handling, submission of specimen other than nasopharyngeal swab, presence of viral mutation(s) within the areas targeted by this assay, and inadequate number of viral copies(<138 copies/mL). A negative result must be combined with clinical observations, patient history, and epidemiological information. The expected result is Negative.  Fact Sheet for Patients:  EntrepreneurPulse.com.au  Fact Sheet for Healthcare Providers:  IncredibleEmployment.be  This test is no t yet approved or cleared by the [place] FDA and  has been authorized for detection and/or diagnosis of SARS-CoV-2 by FDA under an Emergency Use Authorization (EUA). This EUA will remain  in effect (meaning this test can be used) for the duration of the COVID-19 declaration under Section 564(b)(1) of the Act, 21 U.S.C.section 360bbb-3(b)(1), unless the authorization is terminated  or revoked sooner.       Influenza A by PCR NEGATIVE NEGATIVE Final   Influenza B  by PCR NEGATIVE NEGATIVE Final    Comment: (NOTE) The Xpert Xpress SARS-CoV-2/FLU/RSV plus assay is intended as an aid in the diagnosis of influenza from Nasopharyngeal swab specimens and should not be used as a sole basis for treatment. Nasal washings and aspirates are unacceptable  for Xpert Xpress SARS-CoV-2/FLU/RSV testing.  Fact Sheet for Patients: EntrepreneurPulse.com.au  Fact Sheet for Healthcare Providers: IncredibleEmployment.be  This test is not yet approved or cleared by the [place] FDA and has been authorized for detection and/or diagnosis of SARS-CoV-2 by FDA under an Emergency Use Authorization (EUA). This EUA will remain in effect (meaning this test can be used) for the duration of the COVID-19 declaration under Section 564(b)(1) of the Act, 21 U.S.C. section 360bbb-3(b)(1), unless the authorization is terminated or revoked.  Performed at [facility], [place] [address] [place], [place] [address]   Urine Culture     Status: Abnormal   Collection Time: 08/09/20 12:19 AM   Specimen: Urine, Clean Catch  Result Value Ref Range Status   Specimen Description   Final    URINE, CLEAN CATCH Performed at [facility], [place] [address], [place], [place] [address]    Special Requests   Final    NONE Performed at [facility], [place] [address], [place], [place] [address]    Culture >=100,000 COLONIES/mL ENTEROCOCCUS AVIUM (A)  Final   Report Status 08/11/2020 FINAL  Final   Organism ID, Bacteria ENTEROCOCCUS AVIUM (A)  Final      Susceptibility   Enterococcus avium - MIC*    AMPICILLIN <=2 SENSITIVE Sensitive     NITROFURANTOIN 32 SENSITIVE Sensitive     VANCOMYCIN <=0.5 SENSITIVE Sensitive     * >=100,000 COLONIES/mL ENTEROCOCCUS AVIUM          Radiology Studies: No results found.      Scheduled Meds:  enoxaparin (LOVENOX) injection  30 mg Subcutaneous Q24H    insulin aspart  0-6 Units Subcutaneous TID AC & HS   insulin aspart  6 Units Subcutaneous TID WC   insulin glargine  18 Units Subcutaneous q morning   insulin glargine  28 Units Subcutaneous QHS   lactulose  20 g Oral Daily   latanoprost  1 drop Both Eyes QHS   levothyroxine  75 mcg Oral Daily   metoprolol succinate  12.5 mg Oral Daily   pantoprazole  40 mg Oral Daily   polyethylene glycol  17 g Oral BID   senna-docusate  1 tablet Oral BID   simvastatin  20 mg Oral QHS   Continuous Infusions:  penicillin g continuous IV infusion 12 Million Units (08/12/20 0428)     LOS: 3 days    Time spent: 35 minutes    Irine Seal, MD [facility]   To contact the attending provider between 7A-7P or the covering provider during after hours 7P-7A, please log into the web site [email or web address] and access using universal [place] password for that web site. If you do not have the password, please call the hospital operator.  08/12/2020, 12:09 PM

## 2020-08-12 NOTE — Progress Notes (Signed)
Echocardiogram 2D Echocardiogram has been performed.  Oneal Deputy Dragon Thrush RDCS 08/12/2020, 12:44 PM

## 2020-08-12 NOTE — TOC Progression Note (Signed)
Transition of Care [facility]) - Progression Note    Patient Details  Name: Dynah Lehne MRN: [id number] Date of Birth: July 18, 1932  [facility]) CM/SW Contact  Pearley Baranek, Juliann Pulse, RN Phone Number: 08/12/2020, 9:27 AM  Clinical Narrative:  MD aware to inform Case Manager once peer to peer is done it must be done by 12n today with Dr. Lynder Parents [phone].    Expected Discharge Plan: Skilled Nursing Facility Barriers to Discharge: Ship broker  Expected Discharge Plan and Services Expected Discharge Plan: [place]   Discharge Planning Services: CM Consult Post Acute Care Choice: [place] Living arrangements for the past 2 months: [place]                                       Social Determinants of Health (SDOH) Interventions    Readmission Risk Interventions No flowsheet data found.

## 2020-08-13 LAB — COMPREHENSIVE METABOLIC PANEL
ALT: 43 U/L (ref 0–44)
AST: 94 U/L — ABNORMAL HIGH (ref 15–41)
Albumin: 2.6 g/dL — ABNORMAL LOW (ref 3.5–5.0)
Alkaline Phosphatase: 91 U/L (ref 38–126)
Anion gap: 8 (ref 5–15)
BUN: 13 mg/dL (ref 8–23)
CO2: 25 mmol/L (ref 22–32)
Calcium: 8.7 mg/dL — ABNORMAL LOW (ref 8.9–10.3)
Chloride: 100 mmol/L (ref 98–111)
Creatinine, Ser: 0.82 mg/dL (ref 0.44–1.00)
GFR, Estimated: >60 mL/min (ref >60)
Glucose, Bld: 247 mg/dL — ABNORMAL HIGH (ref 70–99)
Potassium: 3.8 mmol/L (ref 3.5–5.1)
Sodium: 133 mmol/L — ABNORMAL LOW (ref 135–145)
Total Bilirubin: 1.3 mg/dL — ABNORMAL HIGH (ref 0.3–1.2)
Total Protein: 5.3 g/dL — ABNORMAL LOW (ref 6.5–8.1)

## 2020-08-13 LAB — GLUCOSE, CAPILLARY
Glucose-Capillary: 258 mg/dL — ABNORMAL HIGH (ref 70–99)
Glucose-Capillary: 263 mg/dL — ABNORMAL HIGH (ref 70–99)
Glucose-Capillary: 198 mg/dL — ABNORMAL HIGH (ref 70–99)
Glucose-Capillary: 217 mg/dL — ABNORMAL HIGH (ref 70–99)

## 2020-08-13 LAB — CBC WITH DIFFERENTIAL/PLATELET
Abs Immature Granulocytes: 0.06 10*3/uL (ref 0.00–0.07)
Basophils Absolute: 0 10*3/uL (ref 0.0–0.1)
Basophils Relative: 1 %
Eosinophils Absolute: 0.1 10*3/uL (ref 0.0–0.5)
Eosinophils Relative: 4 %
HCT: 29.5 % — ABNORMAL LOW (ref 36.0–46.0)
Hemoglobin: 9.7 g/dL — ABNORMAL LOW (ref 12.0–15.0)
Immature Granulocytes: 2 %
Lymphocytes Relative: 16 %
Lymphs Abs: 0.5 10*3/uL — ABNORMAL LOW (ref 0.7–4.0)
MCH: 30.3 pg (ref 26.0–34.0)
MCHC: 32.9 g/dL (ref 30.0–36.0)
MCV: 92.2 fL (ref 80.0–100.0)
Monocytes Absolute: 0.4 10*3/uL (ref 0.1–1.0)
Monocytes Relative: 11 %
Neutro Abs: 2.3 10*3/uL (ref 1.7–7.7)
Neutrophils Relative %: 66 %
Platelets: 50 10*3/uL — ABNORMAL LOW (ref 150–400)
RBC: 3.2 MIL/uL — ABNORMAL LOW (ref 3.87–5.11)
RDW: 14.6 % (ref 11.5–15.5)
WBC: 3.5 10*3/uL — ABNORMAL LOW (ref 4.0–10.5)
nRBC: 0 % (ref 0.0–0.2)

## 2020-08-13 LAB — MAGNESIUM: Magnesium: 1.8 mg/dL (ref 1.7–2.4)

## 2020-08-13 MED ORDER — INSULIN GLARGINE 100 UNIT/ML ~~LOC~~ SOLN
32.0000 [IU] | Freq: Every day | SUBCUTANEOUS | Status: DC
  Administered 2020-08-13 – 2020-08-15 (×3): 32 [IU] via SUBCUTANEOUS
  Filled 2020-08-13 (×3): qty 0.32

## 2020-08-13 MED ORDER — MAGNESIUM SULFATE 2 GM/50ML IV SOLN
2.0000 g | Freq: Once | INTRAVENOUS | Status: AC
  Administered 2020-08-13: 2 g via INTRAVENOUS
  Filled 2020-08-13: qty 50

## 2020-08-13 MED ORDER — INSULIN GLARGINE 100 UNIT/ML ~~LOC~~ SOLN
22.0000 [IU] | Freq: Every morning | SUBCUTANEOUS | Status: DC
  Administered 2020-08-13 – 2020-08-15 (×3): 22 [IU] via SUBCUTANEOUS
  Filled 2020-08-13 (×3): qty 0.22

## 2020-08-13 NOTE — Progress Notes (Signed)
PROGRESS NOTE    Jessica Dennis  [id number] DOB: December 01, 1932 DOA: 08/08/2020 PCP: Virgie Dad, MD    Chief Complaint  Patient presents with   Altered Mental Status    Brief Narrative: Patient is 87 year old female history of alcoholic liver cirrhosis, insulin-dependent diabetes mellitus type 2, diabetic neuropathy, diastolic CHF, hypertension, hyperlipidemia, hypothyroidism, GERD presented to the ED from assisted living facility after being found down on the bathroom floor.  Patient with complaints of generalized weakness and poor appetite.  Patient also had some complaints of vague generalized abdominal discomfort.  Patient went found by staff was tired and lethargic and noted to be febrile.  Patient noted to have a temp of 102.8 on EMS arrival to the facility patient also noted to be incontinent of urine.  Patient admitted noted to have recurrent fevers in the ED course and have multiple SIRS criteria with evidence of encephalopathy and concern for UTI.  CT abdomen and pelvis which was done unremarkable.  Patient noted to have a Streptococcus bacteremia as well as a Enterococcus avium UTI and initially was on IV Rocephin and transition to IV penicillin.  ID consultation pending.   Assessment & Plan:   Principal Problem:   Sepsis secondary to UTI [facility]) Active Problems:   Essential hypertension, benign   Hypothyroidism   GERD without esophagitis   Alcoholic cirrhosis of liver with ascites (HCC)   Chronic diastolic (congestive) heart failure (HCC)   Pancreatic cyst   Fall at home, initial encounter   Lactic acidosis   Mixed hyperlipidemia due to type 2 diabetes mellitus ([place])   Bacteremia due to Gram-positive bacteria   1 sepsis secondary to Enterococcus Avium UTI and Streptococcus gordonii bacteremia -Patient admitted with multiple SIRS criteria in the setting of initially suspected UTI with evidence of encephalopathy, lactic acidosis with concerns for developing  sepsis. -Urinalysis not quite compelling for UTI however initially felt on admission to be likely etiology of patient's source of infection. -Chest x-ray done was negative for any evidence of pneumonia. -COVID-19 PCR negative. -Blood cultures obtained with Streptococcus gordonii and sensitive to penicillin, Rocephin, Levaquin, vancomycin, resistant to erythromycin. -2D echo ordered, however patient seem to have refused. -IV Rocephin narrowed to IV penicillin. -Patient seen in consultation by ID who are recommending continuation of penicillin and could likely transition to oral amoxicillin 3 times daily through 08/14/2020 to complete a 7-day course of antibiotic treatment. -Appreciate ID input and recommendations.  2.  Enterococcus Avium UTI -Urine cultures with > 100,000 colonies of Enterococcus Avium sensitive to the ampicillin's, vancomycin, Bactrim.   -IV Rocephin has been narrowed to IV penicillin.  3.  Probable Streptococcus bacteremia -Blood cultures growing Streptococcus gordonii with resistance to erythromycin, sensitive to penicillin, Rocephin, Levaquin, vancomycin.   -2D echo ordered however patient refused. -IV Rocephin has been narrowed to IV penicillin.  -Patient seen in consultation by ID who are recommending continuation of current IV antibiotics of penicillin and could likely transition to oral amoxicillin 3 times a day through 08/14/2020. -Per ID no indication for TEE. -Follow.  4.  Acute metabolic encephalopathy -Likely secondary to problems #1, 2, 3. -Clinical improvement -Continue empiric IV antibiotics.  5.  Lactic acidosis -Felt secondary to volume depletion and infection. -Lactic acid levels have trended down.   -IV antibiotics.   -Follow.  6.  Hypertension -Toprol-XL.  7.  Hypothyroidism -Synthroid  8.  Diabetes mellitus type 2 -Hemoglobin A1c 6.4. -CBG 258 this morning.   -Continue to hold metformin, Januvia.   -  Increase Lantus to 22 units in the  morning, 32 units at bedtime. -Continue NovoLog 6 units 3 times daily with meals.  -Follow.   9.  History of alcoholic cirrhosis -No evidence of hepatic encephalopathy.   -Continue lactulose.   -Continue to hold spironolactone and Demadex.   -Follow.  10.  Chronic diastolic CHF -Patient with some noted lower extremity edema which has improved. -Diuretics on hold of spironolactone and Demadex due to volume resuscitation and lactic acidosis. -May need to resume diuretics at half home dose when clinically improved or on discharge. -Follow.  11.  Pancreatic cyst -Noted on CT. -Outpatient follow-up.  54.  Fall -PT/OT.  13.  GERD -PPI daily.    DVT prophylaxis: SCDs Code Status: DNR Family Communication: Updated patient.  No family at bedside. Disposition:   Status is: Inpatient  Remains inpatient appropriate because:IV treatments appropriate due to intensity of illness or inability to take PO  Dispo: The patient is from: ALF friend's home              Anticipated d/c is to: SNF              Patient currently is not medically stable to d/c.   Difficult to place patient No       Consultants:  ID: Dr Linus Salmons 08/12/2020  Procedures:  CT abdomen and pelvis 08/09/2020 Chest x-ray 08/08/2020   Antimicrobials:  IV Rocephin 08/08/2020>>> 08/11/2020 IV penicillin 08/11/2020>>>>>>   Subjective: Sitting up in recliner.  No chest pain, no shortness of breath, no abdominal pain.  Still with generalized weakness.    Objective: Vitals:   08/12/20 1335 08/12/20 2037 08/13/20 1014 08/13/20 1203  BP: 122/60 (!) 114/56 (!) 156/79 (!) 122/56  Pulse: (!) 102 (!) 101 (!) 106 (!) 107  Resp: '14 16  14  '$ Temp: 98.3 F (36.8 C) 98.2 F (36.8 C)  98.7 F (37.1 C)  TempSrc: Oral Oral    SpO2: 98% 99%  97%  Weight:      Height:        Intake/Output Summary (Last 24 hours) at 08/13/2020 1321 Last data filed at 08/13/2020 0829 Gross per 24 hour  Intake --  Output 950 ml  Net -950  ml    Filed Weights   08/08/20 1742  Weight: 83.9 kg    Examination:  General exam: No acute distress. Respiratory system: CTA B.  No wheezes, no crackles, no rhonchi.  Normal respiratory effort.  Cardiovascular system: RRR no murmurs rubs or gallops.  No JVD.  Trace -1+ bilateral lower extremity edema. Gastrointestinal system: Abdomen is soft, nontender, nondistended, positive bowel sounds.  No rebound.  No guarding.  Central nervous system: Alert and oriented. No focal neurological deficits. Extremities: Trace to 1+ bilateral lower extremity edema. Skin: No rashes, lesions or ulcers Psychiatry: Judgement and insight appear fair. Mood & affect appropriate.     Data Reviewed: I have personally reviewed following labs and imaging studies  CBC: Recent Labs  Lab 08/08/20 1748 08/09/20 0612 08/10/20 0413 08/11/20 0442 08/12/20 0424 08/13/20 0410  WBC 13.2* 8.6 6.2 3.7* 3.2* 3.5*  NEUTROABS 11.9* 7.8*  --   --  2.2 2.3  HGB 10.3* 9.7* 9.1* 9.2* 9.3* 9.7*  HCT 31.4* 29.6* 27.5* 28.6* 28.7* 29.5*  MCV 93.2 93.4 92.6 92.6 92.3 92.2  PLT 66* 52* 42* 41* 49* 50*     Basic Metabolic Panel: Recent Labs  Lab 08/09/20 0612 08/10/20 0413 08/11/20 0442 08/12/20 0424 08/13/20 0410  NA  134* 134* 134* 133* 133*  K 4.2 3.9 3.9 4.2 3.8  CL 100 101 102 103 100  CO2 '22 22 24 24 25  '$ GLUCOSE 206* 227* 231* 254* 247*  BUN '20 23 16 12 13  '$ CREATININE 1.24* 0.90 0.82 0.83 0.82  CALCIUM 9.2 8.8* 8.7* 8.6* 8.7*  MG 1.9  --  2.0  --  1.8     GFR: Estimated Creatinine Clearance: 54.7 mL/min (by C-G formula based on SCr of 0.82 mg/dL).  Liver Function Tests: Recent Labs  Lab 08/09/20 0612 08/10/20 0413 08/11/20 0442 08/12/20 0424 08/13/20 0410  AST 234* 183* 152* 143* 94*  ALT 43 39 42 50* 43  ALKPHOS 89 69 71 98 91  BILITOT 2.6* 1.6* 1.4* 1.6* 1.3*  PROT 5.9* 5.4* 5.2* 5.2* 5.3*  ALBUMIN 3.1* 2.7* 2.6* 2.6* 2.6*     CBG: Recent Labs  Lab 08/12/20 1129 08/12/20 1708  08/12/20 2033 08/13/20 0732 08/13/20 1121  GLUCAP 294* 201* 257* 258* 263*      Recent Results (from the past 240 hour(s))  Blood culture (routine x 2)     Status: Abnormal   Collection Time: 08/08/20  5:50 PM   Specimen: BLOOD  Result Value Ref Range Status   Specimen Description   Final    BLOOD RIGHT ANTECUBITAL Performed at [facility], [place] [address], [place], [place] [address]    Special Requests   Final    BOTTLES DRAWN AEROBIC AND ANAEROBIC Blood Culture adequate volume Performed at [place] [address], [place], [place] [address]    Culture  Setup Time   Final    GRAM POSITIVE COCCI IN CHAINS ANAEROBIC BOTTLE ONLY CRITICAL RESULT CALLED TO, READ BACK BY AND VERIFIED WITHGuadlupe Spanish PHARMD 1902 08/09/20 A BROWNING Performed at [facility], [place] [address] [place], [place] [address]    Culture STREPTOCOCCUS GORDONII (A)  Final   Report Status 08/11/2020 FINAL  Final   Organism ID, Bacteria STREPTOCOCCUS GORDONII  Final      Susceptibility   Streptococcus gordonii - MIC*    PENICILLIN <=0.06 SENSITIVE Sensitive     CEFTRIAXONE <=0.12 SENSITIVE Sensitive     ERYTHROMYCIN 4 RESISTANT Resistant     LEVOFLOXACIN 0.5 SENSITIVE Sensitive     VANCOMYCIN 0.5 SENSITIVE Sensitive     * STREPTOCOCCUS GORDONII  Blood Culture ID Panel (Reflexed)     Status: Abnormal   Collection Time: 08/08/20  5:50 PM  Result Value Ref Range Status   Enterococcus faecalis NOT DETECTED NOT DETECTED Final   Enterococcus Faecium NOT DETECTED NOT DETECTED Final   Listeria monocytogenes NOT DETECTED NOT DETECTED Final   Staphylococcus species NOT DETECTED NOT DETECTED Final   Staphylococcus aureus (BCID) NOT DETECTED NOT DETECTED Final   Staphylococcus epidermidis NOT DETECTED NOT DETECTED Final   Staphylococcus lugdunensis NOT DETECTED NOT DETECTED Final   Streptococcus species DETECTED (A) NOT DETECTED Final    Comment: Not Enterococcus  species, Streptococcus agalactiae, Streptococcus pyogenes, or Streptococcus pneumoniae. CRITICAL RESULT CALLED TO, READ BACK BY AND VERIFIED WITH: N GLOGOVAC PHARMD 1902 08/09/20 A BROWNING    Streptococcus agalactiae NOT DETECTED NOT DETECTED Final   Streptococcus pneumoniae NOT DETECTED NOT DETECTED Final   Streptococcus pyogenes NOT DETECTED NOT DETECTED Final   A.calcoaceticus-baumannii NOT DETECTED NOT DETECTED Final   Bacteroides fragilis NOT DETECTED NOT DETECTED Final   Enterobacterales NOT DETECTED NOT DETECTED Final   Enterobacter cloacae complex NOT DETECTED NOT DETECTED  Final   Escherichia coli NOT DETECTED NOT DETECTED Final   Klebsiella aerogenes NOT DETECTED NOT DETECTED Final   Klebsiella oxytoca NOT DETECTED NOT DETECTED Final   Klebsiella pneumoniae NOT DETECTED NOT DETECTED Final   Proteus species NOT DETECTED NOT DETECTED Final   Salmonella species NOT DETECTED NOT DETECTED Final   Serratia marcescens NOT DETECTED NOT DETECTED Final   Haemophilus influenzae NOT DETECTED NOT DETECTED Final   Neisseria meningitidis NOT DETECTED NOT DETECTED Final   Pseudomonas aeruginosa NOT DETECTED NOT DETECTED Final   Stenotrophomonas maltophilia NOT DETECTED NOT DETECTED Final   Candida albicans NOT DETECTED NOT DETECTED Final   Candida auris NOT DETECTED NOT DETECTED Final   Candida glabrata NOT DETECTED NOT DETECTED Final   Candida krusei NOT DETECTED NOT DETECTED Final   Candida parapsilosis NOT DETECTED NOT DETECTED Final   Candida tropicalis NOT DETECTED NOT DETECTED Final   Cryptococcus neoformans/gattii NOT DETECTED NOT DETECTED Final    Comment: Performed at [facility], [place] [address], [place], [place] [address]  Blood culture (routine x 2)     Status: None (Preliminary result)   Collection Time: 08/08/20  5:55 PM   Specimen: BLOOD  Result Value Ref Range Status   Specimen Description   Final    BLOOD BLOOD RIGHT HAND Performed at [place] [address], [place], [place] [address]    Special Requests   Final    BOTTLES DRAWN AEROBIC AND ANAEROBIC Blood Culture adequate volume Performed at [place] [address] [place], [place] [address]    Culture   Final    NO GROWTH 4 DAYS Performed at [facility], [place] [address], [place], [place] [address]    Report Status PENDING  Incomplete  Resp Panel by RT-PCR (Flu A&B, Covid) Nasopharyngeal Swab     Status: None   Collection Time: 08/08/20  9:01 PM   Specimen: Nasopharyngeal Swab; Nasopharyngeal(NP) swabs in vial transport medium  Result Value Ref Range Status   SARS Coronavirus 2 by RT PCR NEGATIVE NEGATIVE Final    Comment: (NOTE) SARS-CoV-2 target nucleic acids are NOT DETECTED.  The SARS-CoV-2 RNA is generally detectable in upper respiratory specimens during the acute phase of infection. The lowest concentration of SARS-CoV-2 viral copies this assay can detect is 138 copies/mL. A negative result does not preclude SARS-Cov-2 infection and should not be used as the sole basis for treatment or other patient management decisions. A negative result may occur with  improper specimen collection/handling, submission of specimen other than nasopharyngeal swab, presence of viral mutation(s) within the areas targeted by this assay, and inadequate number of viral copies(<138 copies/mL). A negative result must be combined with clinical observations, patient history, and epidemiological information. The expected result is Negative.  Fact Sheet for Patients:  EntrepreneurPulse.com.au  Fact Sheet for Healthcare Providers:  IncredibleEmployment.be  This test is no t yet approved or cleared by the [place] FDA and  has been authorized for detection and/or diagnosis of SARS-CoV-2 by FDA under an Emergency Use Authorization (EUA). This EUA will remain  in effect (meaning this test can be used) for the  duration of the COVID-19 declaration under Section 564(b)(1) of the Act, 21 U.S.C.section 360bbb-3(b)(1), unless the authorization is terminated  or revoked sooner.       Influenza A by PCR NEGATIVE NEGATIVE Final   Influenza B by PCR NEGATIVE NEGATIVE Final    Comment: (NOTE) The Xpert Xpress SARS-CoV-2/FLU/RSV  plus assay is intended as an aid in the diagnosis of influenza from Nasopharyngeal swab specimens and should not be used as a sole basis for treatment. Nasal washings and aspirates are unacceptable for Xpert Xpress SARS-CoV-2/FLU/RSV testing.  Fact Sheet for Patients: EntrepreneurPulse.com.au  Fact Sheet for Healthcare Providers: IncredibleEmployment.be  This test is not yet approved or cleared by the [place] FDA and has been authorized for detection and/or diagnosis of SARS-CoV-2 by FDA under an Emergency Use Authorization (EUA). This EUA will remain in effect (meaning this test can be used) for the duration of the COVID-19 declaration under Section 564(b)(1) of the Act, 21 U.S.C. section 360bbb-3(b)(1), unless the authorization is terminated or revoked.  Performed at [facility], [place] [address] [place], [place] [address]   Urine Culture     Status: Abnormal   Collection Time: 08/09/20 12:19 AM   Specimen: Urine, Clean Catch  Result Value Ref Range Status   Specimen Description   Final    URINE, CLEAN CATCH Performed at [facility], [place] [address], [place], [place] [address]    Special Requests   Final    NONE Performed at [facility], [place] [address], [place],  [address]    Culture >=100,000 COLONIES/mL ENTEROCOCCUS AVIUM (A)  Final   Report Status 08/11/2020 FINAL  Final   Organism ID, Bacteria ENTEROCOCCUS AVIUM (A)  Final      Susceptibility   Enterococcus avium - MIC*    AMPICILLIN <=2 SENSITIVE Sensitive     NITROFURANTOIN 32 SENSITIVE Sensitive      VANCOMYCIN <=0.5 SENSITIVE Sensitive     * >=100,000 COLONIES/mL ENTEROCOCCUS AVIUM          Radiology Studies: ECHOCARDIOGRAM COMPLETE  Result Date: 08/12/2020    ECHOCARDIOGRAM REPORT   Patient Name:   SEVAEH MAYNES Date of Exam: 08/12/2020 Medical Rec #:  [id number]      Height:       66.0 in Accession #:    [id number]     Weight:       185.0 lb Date of Birth:  [birth year]     BSA:          1.935 m Patient Age:    83 years       BP:           112/62 mmHg Patient Gender: F              HR:           112 bpm. Exam Location:  Inpatient Procedure: 2D Echo, Color Doppler and Cardiac Doppler Indications:    Bacteremia R78.81  History:        Patient has prior history of Echocardiogram examinations, most                 recent 11/27/2019. CHF; Risk Factors:Hypertension, Diabetes and                 Dyslipidemia.  Sonographer:    Raquel Sarna Senior RDCS Referring Phys: [place]  Sonographer Comments: Technically difficult due to poor echo windows. IMPRESSIONS  1. Technically difficult; vegetation cannot be excluded with this study; if clinical suspicion high, suggest TEE to further assess.  2. Left ventricular ejection fraction, by estimation, is 60 to 65%. The left ventricle has normal function. The left ventricle has no regional wall motion abnormalities. There is mild left ventricular hypertrophy of the basal-septal segment. Left ventricular diastolic parameters are consistent with Grade  I diastolic dysfunction (impaired relaxation).  3. Right ventricular systolic function is normal. The right ventricular size is normal.  4. The mitral valve is normal in structure. No evidence of mitral valve regurgitation. No evidence of mitral stenosis.  5. The aortic valve has an indeterminant number of cusps. Aortic valve regurgitation is not visualized. No aortic stenosis is present.  6. The inferior vena cava is normal in size with greater than 50% respiratory variability, suggesting right atrial pressure  of 3 mmHg. FINDINGS  Left Ventricle: Left ventricular ejection fraction, by estimation, is 60 to 65%. The left ventricle has normal function. The left ventricle has no regional wall motion abnormalities. The left ventricular internal cavity size was normal in size. There is  mild left ventricular hypertrophy of the basal-septal segment. Left ventricular diastolic parameters are consistent with Grade I diastolic dysfunction (impaired relaxation). Right Ventricle: The right ventricular size is normal. Right ventricular systolic function is normal. Left Atrium: Left atrial size was normal in size. Right Atrium: Right atrial size was normal in size. Pericardium: There is no evidence of pericardial effusion. Mitral Valve: The mitral valve is normal in structure. Mild mitral annular calcification. No evidence of mitral valve regurgitation. No evidence of mitral valve stenosis. Tricuspid Valve: The tricuspid valve is normal in structure. Tricuspid valve regurgitation is not demonstrated. No evidence of tricuspid stenosis. Aortic Valve: The aortic valve has an indeterminant number of cusps. Aortic valve regurgitation is not visualized. No aortic stenosis is present. Pulmonic Valve: The pulmonic valve was not well visualized. Pulmonic valve regurgitation is not visualized. No evidence of pulmonic stenosis. Aorta: The aortic root is normal in size and structure. Venous: The inferior vena cava is normal in size with greater than 50% respiratory variability, suggesting right atrial pressure of 3 mmHg. IAS/Shunts: The interatrial septum was not well visualized. Additional Comments: Technically difficult; vegetation cannot be excluded with this study; if clinical suspicion high, suggest TEE to further assess.  LEFT VENTRICLE PLAX 2D LVIDd:         3.20 cm LVIDs:         1.90 cm LV PW:         1.00 cm LV IVS:        1.20 cm LVOT diam:     1.80 cm LV SV:         48 LV SV Index:   25 LVOT Area:     2.54 cm  RIGHT VENTRICLE RV S  prime:     11.20 cm/s TAPSE (M-mode): 1.9 cm LEFT ATRIUM             Index LA diam:        3.00 cm 1.55 cm/m LA Vol (A2C):   54.6 ml 28.22 ml/m LA Vol (A4C):   38.0 ml 19.64 ml/m LA Biplane Vol: 47.2 ml 24.40 ml/m  AORTIC VALVE LVOT Vmax:   94.40 cm/s LVOT Vmean:  68.400 cm/s LVOT VTI:    0.190 m  AORTA Ao Root diam: 3.10 cm MITRAL VALVE MV Area (PHT): 2.96 cm    SHUNTS MV Decel Time: 256 msec    Systemic VTI:  0.19 m MV E velocity: 68.30 cm/s  Systemic Diam: 1.80 cm MV A velocity: 94.70 cm/s MV E/A ratio:  0.72 Kirk Ruths MD Electronically signed by Kirk Ruths MD Signature Date/Time: 08/12/2020/2:25:59 PM    Final         Scheduled Meds:  insulin aspart  0-6 Units Subcutaneous TID AC & HS  insulin aspart  6 Units Subcutaneous TID WC   insulin glargine  22 Units Subcutaneous q morning   insulin glargine  32 Units Subcutaneous QHS   lactulose  20 g Oral Daily   latanoprost  1 drop Both Eyes QHS   levothyroxine  75 mcg Oral Daily   metoprolol succinate  12.5 mg Oral Daily   pantoprazole  40 mg Oral Daily   polyethylene glycol  17 g Oral BID   senna-docusate  1 tablet Oral BID   simvastatin  20 mg Oral QHS   Continuous Infusions:  penicillin g continuous IV infusion 12 Million Units (08/13/20 0432)     LOS: 4 days    Time spent: 35 minutes    Irine Seal, MD [facility]   To contact the attending provider between 7A-7P or the covering provider during after hours 7P-7A, please log into the web site [email or web address] and access using universal [place] password for that web site. If you do not have the password, please call the hospital operator.  08/13/2020, 1:21 PM

## 2020-08-14 LAB — GLUCOSE, CAPILLARY
Glucose-Capillary: 167 mg/dL — ABNORMAL HIGH (ref 70–99)
Glucose-Capillary: 234 mg/dL — ABNORMAL HIGH (ref 70–99)
Glucose-Capillary: 162 mg/dL — ABNORMAL HIGH (ref 70–99)
Glucose-Capillary: 256 mg/dL — ABNORMAL HIGH (ref 70–99)

## 2020-08-14 LAB — BASIC METABOLIC PANEL
Anion gap: 4 — ABNORMAL LOW (ref 5–15)
BUN: 14 mg/dL (ref 8–23)
CO2: 25 mmol/L (ref 22–32)
Calcium: 8.5 mg/dL — ABNORMAL LOW (ref 8.9–10.3)
Chloride: 105 mmol/L (ref 98–111)
Creatinine, Ser: 0.83 mg/dL (ref 0.44–1.00)
GFR, Estimated: >60 mL/min (ref >60)
Glucose, Bld: 168 mg/dL — ABNORMAL HIGH (ref 70–99)
Potassium: 3.6 mmol/L (ref 3.5–5.1)
Sodium: 134 mmol/L — ABNORMAL LOW (ref 135–145)

## 2020-08-14 LAB — CULTURE, BLOOD (ROUTINE X 2)
Culture: NO GROWTH
Special Requests: ADEQUATE

## 2020-08-14 LAB — CBC
HCT: 29.4 % — ABNORMAL LOW (ref 36.0–46.0)
Hemoglobin: 9.3 g/dL — ABNORMAL LOW (ref 12.0–15.0)
MCH: 29.7 pg (ref 26.0–34.0)
MCHC: 31.6 g/dL (ref 30.0–36.0)
MCV: 93.9 fL (ref 80.0–100.0)
Platelets: 56 10*3/uL — ABNORMAL LOW (ref 150–400)
RBC: 3.13 MIL/uL — ABNORMAL LOW (ref 3.87–5.11)
RDW: 14.7 % (ref 11.5–15.5)
WBC: 4.4 10*3/uL (ref 4.0–10.5)
nRBC: 0 % (ref 0.0–0.2)

## 2020-08-14 LAB — MAGNESIUM: Magnesium: 1.9 mg/dL (ref 1.7–2.4)

## 2020-08-14 NOTE — TOC Progression Note (Signed)
Transition of Care [facility]) - Progression Note    Patient Details  Name: Jessica Dennis MRN: [id number] Date of Birth: 08-Mar-1932  [facility] CM/SW Contact  Ross Ludwig, [place] Phone Number: 08/14/2020, 4:52 PM  Clinical Narrative:     Insurance approved patient for SNF, can go on Monday to [place] if medically ready.  CSW requested new covid test.  TOC to continue to follow patient's progress throughout discharge planning.   Expected Discharge Plan: [place] Barriers to Discharge: Continued Medical Work up  Expected Discharge Plan and Services Expected Discharge Plan: [place]   Discharge Planning Services: CM Consult Post Acute Care Choice: [place] Living arrangements for the past 2 months: [place]                                       Social Determinants of Health (SDOH) Interventions    Readmission Risk Interventions No flowsheet data found.

## 2020-08-14 NOTE — Progress Notes (Signed)
End of Shift:  Patient had an uneventful night. IV line placed at beginning of shift. Up to [facility] twice overnight with one bowel movement. No acute changes in vitals or on telemetry. No changes in mentation.

## 2020-08-14 NOTE — Progress Notes (Signed)
PROGRESS NOTE    Jessica Dennis  [id number] DOB: 09-Jun-1932 DOA: 08/08/2020 PCP: Virgie Dad, MD    Chief Complaint  Patient presents with   Altered Mental Status    Brief Narrative: Patient is 87 year old female history of alcoholic liver cirrhosis, insulin-dependent diabetes mellitus type 2, diabetic neuropathy, diastolic CHF, hypertension, hyperlipidemia, hypothyroidism, GERD presented to the ED from assisted living facility after being found down on the bathroom floor.  Patient with complaints of generalized weakness and poor appetite.  Patient also had some complaints of vague generalized abdominal discomfort.  Patient went found by staff was tired and lethargic and noted to be febrile.  Patient noted to have a temp of 102.8 on EMS arrival to the facility patient also noted to be incontinent of urine.  Patient admitted noted to have recurrent fevers in the ED course and have multiple SIRS criteria with evidence of encephalopathy and concern for UTI.  CT abdomen and pelvis which was done unremarkable.  Patient noted to have a Streptococcus bacteremia as well as a Enterococcus avium UTI and initially was on IV Rocephin and transition to IV penicillin.  ID consultation pending.   Assessment & Plan:   Principal Problem:   Sepsis secondary to UTI [facility]) Active Problems:   Essential hypertension, benign   Hypothyroidism   GERD without esophagitis   Alcoholic cirrhosis of liver with ascites (HCC)   Chronic diastolic (congestive) heart failure (HCC)   Pancreatic cyst   Fall at home, initial encounter   Lactic acidosis   Mixed hyperlipidemia due to type 2 diabetes mellitus ([place])   Bacteremia due to Gram-positive bacteria   1 sepsis secondary to Enterococcus Avium UTI and Streptococcus gordonii bacteremia -Patient admitted with multiple SIRS criteria in the setting of initially suspected UTI with evidence of encephalopathy, lactic acidosis with concerns for developing  sepsis. -Urinalysis not quite compelling for UTI however initially felt on admission to be likely etiology of patient's source of infection. -Chest x-ray done was negative for any evidence of pneumonia. -COVID-19 PCR negative. -Blood cultures obtained with Streptococcus gordonii and sensitive to penicillin, Rocephin, Levaquin, vancomycin, resistant to erythromycin. -2D echo ordered, however patient seems to have refused. -IV Rocephin narrowed to IV penicillin. -Patient seen in consultation by ID who are recommending continuation of penicillin and could likely transition to oral amoxicillin 3 times daily through 08/14/2020 to complete a 7-day course of antibiotic treatment. -Appreciate ID input and recommendations.  2.  Enterococcus Avium UTI -Urine cultures with > 100,000 colonies of Enterococcus Avium sensitive to the ampicillin's, vancomycin, Bactrim.   -IV Rocephin has been narrowed to IV penicillin.  3.  Probable Streptococcus bacteremia -Blood cultures growing Streptococcus gordonii with resistance to erythromycin, sensitive to penicillin, Rocephin, Levaquin, vancomycin.   -2D echo ordered however patient refused. -IV Rocephin has been narrowed to IV penicillin.  -Patient seen in consultation by ID who are recommending continuation of current IV antibiotics of penicillin and could likely transition to oral amoxicillin 3 times a day through 08/14/2020. -Per ID no indication for TEE. -Follow.  4.  Acute metabolic encephalopathy -Likely secondary to problems #1, 2, 3. -Clinical improvement.   -Likely close to baseline.   -IV antibiotics.    5.  Lactic acidosis -Felt secondary to volume depletion and infection. -Lactic acid levels have trended down.   -IV antibiotics.   -Follow.  6.  Hypertension -Continue Toprol-XL.  7.  Hypothyroidism -Synthroid  8.  Diabetes mellitus type 2 -Hemoglobin A1c 6.4. -CBG 167 this morning.   -  Continue Lantus 22 units in the morning, 32 units at  bedtime.  Continue NovoLog 6 units 3 times with meals.  SSI.   -Continue to hold metformin and Januvia.   -Follow.   9.  History of alcoholic cirrhosis -No evidence of hepatic encephalopathy.   -Continue lactulose.   -Spironolactone and Demadex on hold.   -Likely resume diuretics on discharge. -Follow.  10.  Chronic diastolic CHF -Patient with improvement with lower extremity edema.   -Diuretics on hold of spironolactone and Demadex due to volume resuscitation and lactic acidosis. -May need to resume diuretics at half home dose on discharge with close outpatient follow-up with PCP.   11.  Pancreatic cyst -Noted on CT. -Outpatient follow-up.  37.  Fall -PT/OT.  13.  GERD -PPI.    DVT prophylaxis: SCDs Code Status: DNR Family Communication: Updated patient.  No family at bedside. Disposition:   Status is: Inpatient  Remains inpatient appropriate because:IV treatments appropriate due to intensity of illness or inability to take PO  Dispo: The patient is from: ALF friend's home              Anticipated d/c is to: SNF              Patient currently is not medically stable to d/c.   Difficult to place patient No       Consultants:  ID: Dr Linus Salmons 08/12/2020  Procedures:  CT abdomen and pelvis 08/09/2020 Chest x-ray 08/08/2020   Antimicrobials:  IV Rocephin 08/08/2020>>> 08/11/2020 IV penicillin 08/11/2020>>>>>>   Subjective: 9 in bed about to eat lunch.  No chest pain.  No shortness of breath.  No abdominal pain.   Objective: Vitals:   08/13/20 1203 08/13/20 2214 08/14/20 0514 08/14/20 1153  BP: (!) 122/56 111/60 107/60 111/63  Pulse: (!) 107 100 (!) 109 (!) 109  Resp: '14 20 20 18  '$ Temp: 98.7 F (37.1 C) 98.3 F (36.8 C) 97.9 F (36.6 C) 98 F (36.7 C)  TempSrc:  Oral Oral Oral  SpO2: 97% 96% 95% 97%  Weight:      Height:        Intake/Output Summary (Last 24 hours) at 08/14/2020 1213 Last data filed at 08/14/2020 0800 Gross per 24 hour  Intake 2148  ml  Output --  Net 2148 ml    Filed Weights   08/08/20 1742  Weight: 83.9 kg    Examination:  General exam: NAD Respiratory system: Lungs clear to auscultation bilaterally.  No wheezes, no crackles, no rhonchi.  Normal respiratory effort.   Cardiovascular system: Regular rate rhythm no murmurs rubs or gallops.  No JVD.  Trace bilateral lower extremity edema.  Gastrointestinal system: Abdomen is soft, nontender, nondistended, positive bowel sounds.  No rebound.  No guarding. Central nervous system: Alert and oriented.  Moving extremities spontaneously.  No focal neurological deficits.   Extremities: Trace bilateral lower extremity edema. Skin: No rashes, lesions or ulcers Psychiatry: Judgement and insight appear fair. Mood & affect appropriate.     Data Reviewed: I have personally reviewed following labs and imaging studies  CBC: Recent Labs  Lab 08/08/20 1748 08/09/20 0612 08/10/20 0413 08/11/20 0442 08/12/20 0424 08/13/20 0410 08/14/20 0535  WBC 13.2* 8.6 6.2 3.7* 3.2* 3.5* 4.4  NEUTROABS 11.9* 7.8*  --   --  2.2 2.3  --   HGB 10.3* 9.7* 9.1* 9.2* 9.3* 9.7* 9.3*  HCT 31.4* 29.6* 27.5* 28.6* 28.7* 29.5* 29.4*  MCV 93.2 93.4 92.6 92.6 92.3 92.2 93.9  PLT 66* 52* 42* 41* 49* 50* 56*     Basic Metabolic Panel: Recent Labs  Lab 08/09/20 0612 08/10/20 0413 08/11/20 0442 08/12/20 0424 08/13/20 0410 08/14/20 0535  NA 134* 134* 134* 133* 133* 134*  K 4.2 3.9 3.9 4.2 3.8 3.6  CL 100 101 102 103 100 105  CO2 '22 22 24 24 25 25  '$ GLUCOSE 206* 227* 231* 254* 247* 168*  BUN '20 23 16 12 13 14  '$ CREATININE 1.24* 0.90 0.82 0.83 0.82 0.83  CALCIUM 9.2 8.8* 8.7* 8.6* 8.7* 8.5*  MG 1.9  --  2.0  --  1.8 1.9     GFR: Estimated Creatinine Clearance: 54.1 mL/min (by C-G formula based on SCr of 0.83 mg/dL).  Liver Function Tests: Recent Labs  Lab 08/09/20 0612 08/10/20 0413 08/11/20 0442 08/12/20 0424 08/13/20 0410  AST 234* 183* 152* 143* 94*  ALT 43 39 42 50* 43   ALKPHOS 89 69 71 98 91  BILITOT 2.6* 1.6* 1.4* 1.6* 1.3*  PROT 5.9* 5.4* 5.2* 5.2* 5.3*  ALBUMIN 3.1* 2.7* 2.6* 2.6* 2.6*     CBG: Recent Labs  Lab 08/13/20 1121 08/13/20 1623 08/13/20 2157 08/14/20 0741 08/14/20 1125  GLUCAP 263* 198* 217* 167* 234*      Recent Results (from the past 240 hour(s))  Blood culture (routine x 2)     Status: Abnormal   Collection Time: 08/08/20  5:50 PM   Specimen: BLOOD  Result Value Ref Range Status   Specimen Description   Final    BLOOD RIGHT ANTECUBITAL Performed at [facility], [place] [address], [place], [place] [address]    Special Requests   Final    BOTTLES DRAWN AEROBIC AND ANAEROBIC Blood Culture adequate volume Performed at [facility] [address] [place], [place] [address]    Culture  Setup Time   Final    GRAM POSITIVE COCCI IN CHAINS ANAEROBIC BOTTLE ONLY CRITICAL RESULT CALLED TO, READ BACK BY AND VERIFIED WITHGuadlupe Spanish PHARMD 1902 08/09/20 A BROWNING Performed at [facility], [place] [address] [place] [address] [place], [place] [address]    Culture STREPTOCOCCUS GORDONII (A)  Final   Report Status 08/11/2020 FINAL  Final   Organism ID, Bacteria STREPTOCOCCUS GORDONII  Final      Susceptibility   Streptococcus gordonii - MIC*    PENICILLIN <=0.06 SENSITIVE Sensitive     CEFTRIAXONE <=0.12 SENSITIVE Sensitive     ERYTHROMYCIN 4 RESISTANT Resistant     LEVOFLOXACIN 0.5 SENSITIVE Sensitive     VANCOMYCIN 0.5 SENSITIVE Sensitive     * STREPTOCOCCUS GORDONII  Blood Culture ID Panel (Reflexed)     Status: Abnormal   Collection Time: 08/08/20  5:50 PM  Result Value Ref Range Status   Enterococcus faecalis NOT DETECTED NOT DETECTED Final   Enterococcus Faecium NOT DETECTED NOT DETECTED Final   Listeria monocytogenes NOT DETECTED NOT DETECTED Final   Staphylococcus species NOT DETECTED NOT DETECTED Final   Staphylococcus aureus (BCID) NOT DETECTED NOT DETECTED Final   Staphylococcus  epidermidis NOT DETECTED NOT DETECTED Final   Staphylococcus lugdunensis NOT DETECTED NOT DETECTED Final   Streptococcus species DETECTED (A) NOT DETECTED Final    Comment: Not Enterococcus species, Streptococcus agalactiae, Streptococcus pyogenes, or Streptococcus pneumoniae. CRITICAL RESULT CALLED TO, READ BACK BY AND VERIFIED WITH: N GLOGOVAC PHARMD 1902 08/09/20 A BROWNING    Streptococcus agalactiae NOT DETECTED NOT DETECTED Final   Streptococcus pneumoniae NOT DETECTED NOT DETECTED Final  Streptococcus pyogenes NOT DETECTED NOT DETECTED Final   A.calcoaceticus-baumannii NOT DETECTED NOT DETECTED Final   Bacteroides fragilis NOT DETECTED NOT DETECTED Final   Enterobacterales NOT DETECTED NOT DETECTED Final   Enterobacter cloacae complex NOT DETECTED NOT DETECTED Final   Escherichia coli NOT DETECTED NOT DETECTED Final   Klebsiella aerogenes NOT DETECTED NOT DETECTED Final   Klebsiella oxytoca NOT DETECTED NOT DETECTED Final   Klebsiella pneumoniae NOT DETECTED NOT DETECTED Final   Proteus species NOT DETECTED NOT DETECTED Final   Salmonella species NOT DETECTED NOT DETECTED Final   Serratia marcescens NOT DETECTED NOT DETECTED Final   Haemophilus influenzae NOT DETECTED NOT DETECTED Final   Neisseria meningitidis NOT DETECTED NOT DETECTED Final   Pseudomonas aeruginosa NOT DETECTED NOT DETECTED Final   Stenotrophomonas maltophilia NOT DETECTED NOT DETECTED Final   Candida albicans NOT DETECTED NOT DETECTED Final   Candida auris NOT DETECTED NOT DETECTED Final   Candida glabrata NOT DETECTED NOT DETECTED Final   Candida krusei NOT DETECTED NOT DETECTED Final   Candida parapsilosis NOT DETECTED NOT DETECTED Final   Candida tropicalis NOT DETECTED NOT DETECTED Final   Cryptococcus neoformans/gattii NOT DETECTED NOT DETECTED Final    Comment: Performed at [facility], [address] [place], [place] [address]  Blood culture (routine x 2)     Status: None   Collection Time:  08/08/20  5:55 PM   Specimen: BLOOD  Result Value Ref Range Status   Specimen Description   Final    BLOOD BLOOD RIGHT HAND Performed at [facility] [address], [place], [place] [address]    Special Requests   Final    BOTTLES DRAWN AEROBIC AND ANAEROBIC Blood Culture adequate volume Performed at [facility] [address] [place], [place] [address]    Culture   Final    NO GROWTH 5 DAYS Performed at [facility], [place] [address] [place], [place] [address]    Report Status 08/14/2020 FINAL  Final  Resp Panel by RT-PCR (Flu A&B, Covid) Nasopharyngeal Swab     Status: None   Collection Time: 08/08/20  9:01 PM   Specimen: Nasopharyngeal Swab; Nasopharyngeal(NP) swabs in vial transport medium  Result Value Ref Range Status   SARS Coronavirus 2 by RT PCR NEGATIVE NEGATIVE Final    Comment: (NOTE) SARS-CoV-2 target nucleic acids are NOT DETECTED.  The SARS-CoV-2 RNA is generally detectable in upper respiratory specimens during the acute phase of infection. The lowest concentration of SARS-CoV-2 viral copies this assay can detect is 138 copies/mL. A negative result does not preclude SARS-Cov-2 infection and should not be used as the sole basis for treatment or other patient management decisions. A negative result may occur with  improper specimen collection/handling, submission of specimen other than nasopharyngeal swab, presence of viral mutation(s) within the areas targeted by this assay, and inadequate number of viral copies(<138 copies/mL). A negative result must be combined with clinical observations, patient history, and epidemiological information. The expected result is Negative.  Fact Sheet for Patients:  EntrepreneurPulse.com.au  Fact Sheet for Healthcare Providers:  IncredibleEmployment.be  This test is no t yet approved or cleared by the [place] FDA and  has been authorized  for detection and/or diagnosis of SARS-CoV-2 by FDA under an Emergency Use Authorization (EUA). This EUA will remain  in effect (meaning this test can be used) for the duration of the COVID-19 declaration under Section 564(b)(1) of the Act, 21 U.S.C.section 360bbb-3(b)(1),  unless the authorization is terminated  or revoked sooner.       Influenza A by PCR NEGATIVE NEGATIVE Final   Influenza B by PCR NEGATIVE NEGATIVE Final    Comment: (NOTE) The Xpert Xpress SARS-CoV-2/FLU/RSV plus assay is intended as an aid in the diagnosis of influenza from Nasopharyngeal swab specimens and should not be used as a sole basis for treatment. Nasal washings and aspirates are unacceptable for Xpert Xpress SARS-CoV-2/FLU/RSV testing.  Fact Sheet for Patients: EntrepreneurPulse.com.au  Fact Sheet for Healthcare Providers: IncredibleEmployment.be  This test is not yet approved or cleared by the [place] FDA and has been authorized for detection and/or diagnosis of SARS-CoV-2 by FDA under an Emergency Use Authorization (EUA). This EUA will remain in effect (meaning this test can be used) for the duration of the COVID-19 declaration under Section 564(b)(1) of the Act, 21 U.S.C. section 360bbb-3(b)(1), unless the authorization is terminated or revoked.  Performed at [facility], [place] [address], [place], [place] [address]   Urine Culture     Status: Abnormal   Collection Time: 08/09/20 12:19 AM   Specimen: Urine, Clean Catch  Result Value Ref Range Status   Specimen Description   Final    URINE, CLEAN CATCH Performed at [facility], [place] [address], [place], [place] [address]    Special Requests   Final    NONE Performed at [facility], [place] [address] [place], [place] [address]    Culture >=100,000 COLONIES/mL ENTEROCOCCUS AVIUM (A)  Final   Report Status 08/11/2020 FINAL  Final   Organism ID,  Bacteria ENTEROCOCCUS AVIUM (A)  Final      Susceptibility   Enterococcus avium - MIC*    AMPICILLIN <=2 SENSITIVE Sensitive     NITROFURANTOIN 32 SENSITIVE Sensitive     VANCOMYCIN <=0.5 SENSITIVE Sensitive     * >=100,000 COLONIES/mL ENTEROCOCCUS AVIUM          Radiology Studies: ECHOCARDIOGRAM COMPLETE  Result Date: 08/12/2020    ECHOCARDIOGRAM REPORT   Patient Name:   Jessica Dennis Date of Exam: 08/12/2020 Medical Rec #:  [id number]      Height:       66.0 in Accession #:    [id number]     Weight:       185.0 lb Date of Birth:  1932/09/08     BSA:          1.935 m Patient Age:    55 years       BP:           112/62 mmHg Patient Gender: F              HR:           112 bpm. Exam Location:  Inpatient Procedure: 2D Echo, Color Doppler and Cardiac Doppler Indications:    Bacteremia R78.81  History:        Patient has prior history of Echocardiogram examinations, most                 recent 11/27/2019. CHF; Risk Factors:Hypertension, Diabetes and                 Dyslipidemia.  Sonographer:    Raquel Sarna Senior RDCS Referring Phys: [place]  Sonographer Comments: Technically difficult due to poor echo windows. IMPRESSIONS  1. Technically difficult; vegetation cannot be excluded with this study; if clinical suspicion high, suggest TEE to further assess.  2. Left ventricular ejection  fraction, by estimation, is 60 to 65%. The left ventricle has normal function. The left ventricle has no regional wall motion abnormalities. There is mild left ventricular hypertrophy of the basal-septal segment. Left ventricular diastolic parameters are consistent with Grade I diastolic dysfunction (impaired relaxation).  3. Right ventricular systolic function is normal. The right ventricular size is normal.  4. The mitral valve is normal in structure. No evidence of mitral valve regurgitation. No evidence of mitral stenosis.  5. The aortic valve has an indeterminant number of cusps. Aortic valve regurgitation is  not visualized. No aortic stenosis is present.  6. The inferior vena cava is normal in size with greater than 50% respiratory variability, suggesting right atrial pressure of 3 mmHg. FINDINGS  Left Ventricle: Left ventricular ejection fraction, by estimation, is 60 to 65%. The left ventricle has normal function. The left ventricle has no regional wall motion abnormalities. The left ventricular internal cavity size was normal in size. There is  mild left ventricular hypertrophy of the basal-septal segment. Left ventricular diastolic parameters are consistent with Grade I diastolic dysfunction (impaired relaxation). Right Ventricle: The right ventricular size is normal. Right ventricular systolic function is normal. Left Atrium: Left atrial size was normal in size. Right Atrium: Right atrial size was normal in size. Pericardium: There is no evidence of pericardial effusion. Mitral Valve: The mitral valve is normal in structure. Mild mitral annular calcification. No evidence of mitral valve regurgitation. No evidence of mitral valve stenosis. Tricuspid Valve: The tricuspid valve is normal in structure. Tricuspid valve regurgitation is not demonstrated. No evidence of tricuspid stenosis. Aortic Valve: The aortic valve has an indeterminant number of cusps. Aortic valve regurgitation is not visualized. No aortic stenosis is present. Pulmonic Valve: The pulmonic valve was not well visualized. Pulmonic valve regurgitation is not visualized. No evidence of pulmonic stenosis. Aorta: The aortic root is normal in size and structure. Venous: The inferior vena cava is normal in size with greater than 50% respiratory variability, suggesting right atrial pressure of 3 mmHg. IAS/Shunts: The interatrial septum was not well visualized. Additional Comments: Technically difficult; vegetation cannot be excluded with this study; if clinical suspicion high, suggest TEE to further assess.  LEFT VENTRICLE PLAX 2D LVIDd:         3.20 cm  LVIDs:         1.90 cm LV PW:         1.00 cm LV IVS:        1.20 cm LVOT diam:     1.80 cm LV SV:         48 LV SV Index:   25 LVOT Area:     2.54 cm  RIGHT VENTRICLE RV S prime:     11.20 cm/s TAPSE (M-mode): 1.9 cm LEFT ATRIUM             Index LA diam:        3.00 cm 1.55 cm/m LA Vol (A2C):   54.6 ml 28.22 ml/m LA Vol (A4C):   38.0 ml 19.64 ml/m LA Biplane Vol: 47.2 ml 24.40 ml/m  AORTIC VALVE LVOT Vmax:   94.40 cm/s LVOT Vmean:  68.400 cm/s LVOT VTI:    0.190 m  AORTA Ao Root diam: 3.10 cm MITRAL VALVE MV Area (PHT): 2.96 cm    SHUNTS MV Decel Time: 256 msec    Systemic VTI:  0.19 m MV E velocity: 68.30 cm/s  Systemic Diam: 1.80 cm MV A velocity: 94.70 cm/s MV E/A ratio:  0.72  Kirk Ruths MD Electronically signed by Kirk Ruths MD Signature Date/Time: 08/12/2020/2:25:59 PM    Final         Scheduled Meds:  insulin aspart  0-6 Units Subcutaneous TID AC & HS   insulin aspart  6 Units Subcutaneous TID WC   insulin glargine  22 Units Subcutaneous q morning   insulin glargine  32 Units Subcutaneous QHS   lactulose  20 g Oral Daily   latanoprost  1 drop Both Eyes QHS   levothyroxine  75 mcg Oral Daily   metoprolol succinate  12.5 mg Oral Daily   pantoprazole  40 mg Oral Daily   polyethylene glycol  17 g Oral BID   senna-docusate  1 tablet Oral BID   simvastatin  20 mg Oral QHS   Continuous Infusions:  penicillin g continuous IV infusion 12 Million Units (08/14/20 0511)     LOS: 5 days    Time spent: 35 minutes    Irine Seal, MD [facility]   To contact the attending provider between 7A-7P or the covering provider during after hours 7P-7A, please log into the web site [email or web address] and access using universal [place] password for that web site. If you do not have the password, please call the hospital operator.  08/14/2020, 12:13 PM

## 2020-08-15 DIAGNOSIS — A419 Sepsis, unspecified organism: Secondary | ICD-10-CM

## 2020-08-15 LAB — BASIC METABOLIC PANEL
Anion gap: 6 (ref 5–15)
BUN: 15 mg/dL (ref 8–23)
CO2: 25 mmol/L (ref 22–32)
Calcium: 8.3 mg/dL — ABNORMAL LOW (ref 8.9–10.3)
Chloride: 103 mmol/L (ref 98–111)
Creatinine, Ser: 0.97 mg/dL (ref 0.44–1.00)
GFR, Estimated: 57 mL/min — ABNORMAL LOW (ref >60)
Glucose, Bld: 186 mg/dL — ABNORMAL HIGH (ref 70–99)
Potassium: 3.8 mmol/L (ref 3.5–5.1)
Sodium: 134 mmol/L — ABNORMAL LOW (ref 135–145)

## 2020-08-15 LAB — GLUCOSE, CAPILLARY
Glucose-Capillary: 150 mg/dL — ABNORMAL HIGH (ref 70–99)
Glucose-Capillary: 290 mg/dL — ABNORMAL HIGH (ref 70–99)
Glucose-Capillary: 198 mg/dL — ABNORMAL HIGH (ref 70–99)
Glucose-Capillary: 174 mg/dL — ABNORMAL HIGH (ref 70–99)

## 2020-08-15 LAB — SARS CORONAVIRUS 2 (TAT 6-24 HRS): SARS Coronavirus 2: NEGATIVE

## 2020-08-15 MED ORDER — POTASSIUM CHLORIDE CRYS ER 20 MEQ PO TBCR: 20.0000 meq | EXTENDED_RELEASE_TABLET | Freq: Every day | ORAL | Status: DC

## 2020-08-15 MED ORDER — POLYETHYLENE GLYCOL 3350 17 G PO PACK
17.0000 g | PACK | Freq: Two times a day (BID) | ORAL | 0 refills | Status: DC
Start: 2020-08-15 — End: 2021-01-02

## 2020-08-15 MED ORDER — INSULIN GLARGINE 100 UNIT/ML ~~LOC~~ SOLN: 22.0000 [IU] | Freq: Every morning | SUBCUTANEOUS | 11 refills | Status: DC

## 2020-08-15 MED ORDER — AMOXICILLIN 250 MG PO CAPS
1000.0000 mg | ORAL_CAPSULE | Freq: Once | ORAL | Status: AC
  Administered 2020-08-15: 1000 mg via ORAL
  Filled 2020-08-15: qty 4

## 2020-08-15 MED ORDER — TORSEMIDE 20 MG PO TABS: 20.0000 mg | ORAL_TABLET | Freq: Every day | ORAL | Status: DC

## 2020-08-15 MED ORDER — LORAZEPAM 0.5 MG PO TABS: 0.5000 mg | ORAL_TABLET | Freq: Every day | ORAL | 0 refills | Status: DC

## 2020-08-15 MED ORDER — SENNOSIDES-DOCUSATE SODIUM 8.6-50 MG PO TABS: 1.0000 | ORAL_TABLET | Freq: Two times a day (BID) | ORAL | Status: DC

## 2020-08-15 MED ORDER — INSULIN GLARGINE 100 UNIT/ML ~~LOC~~ SOLN: 32.0000 [IU] | Freq: Every day | SUBCUTANEOUS | 11 refills | Status: DC

## 2020-08-15 MED ORDER — SPIRONOLACTONE 25 MG PO TABS: 25.0000 mg | ORAL_TABLET | Freq: Every day | ORAL | Status: DC

## 2020-08-15 NOTE — Plan of Care (Signed)
  Problem: Education: Goal: Knowledge of General Education information will improve Description: Including pain rating scale, medication(s)/side effects and non-pharmacologic comfort measures Outcome: Progressing   Problem: Clinical Measurements: Goal: Will remain free from infection Outcome: Progressing Goal: Respiratory complications will improve Outcome: Progressing Goal: Cardiovascular complication will be avoided Outcome: Progressing   Problem: Safety: Goal: Ability to remain free from injury will improve Outcome: Progressing

## 2020-08-15 NOTE — Care Management Important Message (Signed)
Important Message  Patient Details IM Letter placed in Patient's room. Name: Jessica Dennis MRN: [id number] Date of Birth: 1932-06-28   Medicare Important Message Given:  Yes     Kerin Salen 08/15/2020, 1:06 PM

## 2020-08-15 NOTE — Consult Note (Signed)
Referral MD  Reason for Referral: NASH with thrombocytopenia; Streptococcus bacteremia  Chief Complaint  Patient presents with   Altered Mental Status  : I fell at home.  HPI: [name] is well-known to me.  She is a very charming 87 year old [place] female.  She has a history of NASH.  She has varices.  She has had a past history of GI bleed from varices.  She has thrombocytopenia..  We last saw her in the office about on 08/05/2020.    She does have splenomegaly.  She did have a CT of the abdomen pelvis on 08/09/2020.  This did show hepatic cirrhosis with portal hypertension.  She had splenomegaly with her spleen measuring 16.7 cm.  She was found at home on the floor.  She is not sure why she fell.  She ultimately was admitted.  She was found to have Streptococcus in her blood.  She had Enterococcus in her urine.  She has been on penicillin.  An echocardiogram was done which did not show any obvious vegetations.  She has had thrombocytopenia.  When she came in, her white cell count was 8.6.  Hemoglobin 9.7.  Platelet count 52,000.  Her platelet count went down a little bit.  Her platelet count was down to 41,000 on 08/11/2020.  She is still somewhat anemic.  On the 24 July, her white count 4.4.  Hemoglobin 9.3.  Platelet count 56,000.  She had iron studies done on 08/05/2020.  This showed a ferritin of 26 with an iron saturation of 22%.  Back in June, we did check an erythropoietin level on her.  It was only 36.  She did not denies any obvious bleeding.  There is no melena.  She had no hematochezia.  She is not sure if she had a temperature.  In the hospital, she is responding to penicillin.  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 Some GAVE also   Thyroid disease   :   Past Surgical History:  Procedure Laterality Date   ABDOMINAL HYSTERECTOMY     APPENDECTOMY     COLONOSCOPY     ESOPHAGOGASTRODUODENOSCOPY     ESOPHAGOGASTRODUODENOSCOPY (EGD) WITH PROPOFOL N/A 02/22/2020   Procedure: ESOPHAGOGASTRODUODENOSCOPY (EGD) WITH PROPOFOL;  Surgeon: Gatha Mayer, MD;  Location: [facility];  Service: Endoscopy;  Laterality: N/A;   IR PARACENTESIS  05/25/2016   IR THORACENTESIS ASP PLEURAL SPACE W/IMG GUIDE  02/23/2020   LEG SKIN LESION  BIOPSY / EXCISION  12/11/14   MOHS SURGERY     ankle   TONSILLECTOMY     TOTAL HIP ARTHROPLASTY Bilateral 1993, 2006   UPPER GASTROINTESTINAL ENDOSCOPY  09/09/2017  :   Current Facility-Administered Medications:    acetaminophen (TYLENOL) tablet 650 mg, 650 mg, Oral, Q6H PRN, 650 mg at 08/09/20 0803 **OR** acetaminophen (TYLENOL) suppository 650 mg, 650 mg, Rectal, Q6H PRN, Shalhoub, Sherryll Burger, MD   insulin  aspart (novoLOG) injection 0-6 Units, 0-6 Units, Subcutaneous, TID AC & HS, Shalhoub, Sherryll Burger, MD, 3 Units at 08/14/20 2210   insulin aspart (novoLOG) injection 6 Units, 6 Units, Subcutaneous, TID WC, Eugenie Filler, MD, 6 Units at 08/14/20 1631   insulin glargine (LANTUS) injection 22 Units, 22 Units, Subcutaneous, q morning, Eugenie Filler, MD, 22 Units at 08/14/20 0950   insulin glargine (LANTUS) injection 32 Units, 32 Units, Subcutaneous, QHS, Eugenie Filler, MD, 32 Units at 08/14/20 2209   lactulose (CHRONULAC) 10 GM/15ML solution 20 g, 20 g, Oral, Daily, Shalhoub, Sherryll Burger, MD, 20 g at 08/14/20 0947   latanoprost (XALATAN) 0.005 % ophthalmic solution 1 drop, 1 drop, Both Eyes, QHS, Shalhoub,  Sherryll Burger, MD, 1 drop at 08/14/20 2208   levothyroxine (SYNTHROID) tablet 75 mcg, 75 mcg, Oral, Daily, Shalhoub, Sherryll Burger, MD, 75 mcg at 08/15/20 0524   metoprolol succinate (TOPROL-XL) 24 hr tablet 12.5 mg, 12.5 mg, Oral, Daily, Shalhoub, Sherryll Burger, MD, 12.5 mg at 08/14/20 0947   ondansetron (ZOFRAN) tablet 4 mg, 4 mg, Oral, Q6H PRN **OR** ondansetron (ZOFRAN) injection 4 mg, 4 mg, Intravenous, Q6H PRN, Shalhoub, Sherryll Burger, MD   pantoprazole (PROTONIX) EC tablet 40 mg, 40 mg, Oral, Daily, Shalhoub, Sherryll Burger, MD, 40 mg at 08/14/20 0947   penicillin G potassium 12 Million Units in dextrose 5 % 500 mL continuous infusion, 12 Million Units, Intravenous, Q12H, Eugenie Filler, MD, Last Rate: 41.7 mL/hr at 08/14/20 1631, 12 Million Units at 08/14/20 1631   polyethylene glycol (MIRALAX / GLYCOLAX) packet 17 g, 17 g, Oral, BID, Eugenie Filler, MD, 17 g at 08/13/20 0009   senna-docusate (Senokot-S) tablet 1 tablet, 1 tablet, Oral, BID, Eugenie Filler, MD, 1 tablet at 08/14/20 2209   simvastatin (ZOCOR) tablet 20 mg, 20 mg, Oral, QHS, Shalhoub, Sherryll Burger, MD, 20 mg at 08/14/20 2209:   insulin aspart  0-6 Units Subcutaneous TID AC & HS   insulin aspart  6 Units Subcutaneous TID WC   insulin glargine  22 Units Subcutaneous q morning   insulin glargine  32 Units Subcutaneous QHS   lactulose  20 g Oral Daily   latanoprost  1 drop Both Eyes QHS   levothyroxine  75 mcg Oral Daily   metoprolol succinate  12.5 mg Oral Daily   pantoprazole  40 mg Oral Daily   polyethylene glycol  17 g Oral BID   senna-docusate  1 tablet Oral BID   simvastatin  20 mg Oral QHS  :   Allergies  Allergen Reactions   Tizanidine Other (See Comments)    Hallucinate, confused   :   Family History  Problem Relation Age of Onset   Bladder Cancer Father    Heart attack Father    Diabetes Mother    Colon cancer Neg Hx    Colon polyps Neg Hx    Esophageal cancer Neg Hx    Rectal cancer Neg Hx    Stomach cancer Neg Hx    :   Social History   Socioeconomic History   Marital status: Divorced    Spouse name: Not on file   Number of children: 2   Years of education: Not on file   Highest education level: Not on file  Occupational History   Occupation: retired  Tobacco Use   Smoking status: Never   Smokeless tobacco: Never  Vaping Use   Vaping Use: Never used  Substance and Sexual Activity   Alcohol  use: Yes    Comment: weekly   Drug use: No   Sexual activity: Not on file  Other Topics Concern   Not on file  Social History Narrative   The patient is divorced. She is originally from [place]. She has 2 sons. 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   08/10/2014   Social Determinants of Health   Financial Resource Strain: Not on file  Food Insecurity: Not on file  Transportation Needs: Not on file  Physical Activity: Not on file  Stress: Not on file  Social Connections: Not on file  Intimate Partner Violence: Not on file  : Review of Systems  Constitutional:  Positive for fever and malaise/fatigue.  HENT: Negative.    Eyes: Negative.   Respiratory: Negative.    Cardiovascular: Negative.   Gastrointestinal: Negative.   Genitourinary: Negative.   Musculoskeletal: Negative.   Skin: Negative.   Neurological:  Positive for dizziness.  Endo/Heme/Allergies:  Bruises/bleeds easily.  Psychiatric/Behavioral: Negative.      Exam: Physical Exam Vitals reviewed.  HENT:     Head: Normocephalic and atraumatic.  Eyes:     Pupils: Pupils are equal, round, and reactive to light.  Cardiovascular:     Rate and Rhythm: Normal rate and regular rhythm.     Heart sounds: Normal heart sounds.  Pulmonary:     Effort: Pulmonary effort is normal.     Breath sounds: Normal breath sounds.  Abdominal:     General: Bowel sounds are normal.     Palpations: Abdomen is soft.  Musculoskeletal:        General: No tenderness or deformity. Normal range of motion.      Cervical back: Normal range of motion.  Lymphadenopathy:     Cervical: No cervical adenopathy.  Skin:    General: Skin is warm and dry.     Findings: Bruising present. No erythema or rash.  Neurological:     Mental Status: She is alert and oriented to person, place, and time.  Psychiatric:        Behavior: Behavior normal.        Thought Content: Thought content normal.        Judgment: Judgment normal.     Patient Vitals for the past 24 hrs:  BP Temp Temp src Pulse Resp SpO2  08/15/20 0632 (!) 124/55 98 F (36.7 C) -- (!) 107 20 99 %  08/14/20 2103 115/67 98.6 F (37 C) Oral (!) 110 18 96 %  08/14/20 1153 111/63 98 F (36.7 C) Oral (!) 109 18 97 %      Recent Labs    08/13/20 0410 08/14/20 0535  WBC 3.5* 4.4  HGB 9.7* 9.3*  HCT 29.5* 29.4*  PLT 50* 56*    Recent Labs    08/14/20 0535 08/15/20 0421  NA 134* 134*  K 3.6 3.8  CL 105 103  CO2 25 25  GLUCOSE 168* 186*  BUN 14 15  CREATININE 0.83 0.97  CALCIUM 8.5* 8.3*    Blood smear review: None  Pathology: None    Assessment and Plan: Ms. Lundvall is an 87 year old [place] female.  She has NASH.  She has chronic anemia and thrombocytopenia.  She has splenomegaly.  Would not surprise me if her platelet counts were down a little bit because of this bacteremia.  Thankfully is not a gram-negative rod bacteremia that would really tend to drop the platelet count.  Again she always have an element of thrombocytopenia.Marland Kitchen  She is anemic.  She is not symptomatic with this.  I suppose we can always give her ESA if necessary.  She had iron studies done just a couple weeks ago which were okay.  Hopefully, the antibiotics will take care of this bacteremia.  Hopefully she can go on oral antibiotics.  Lattie Haw, MD  Oswaldo Milian 26:4

## 2020-08-15 NOTE — Plan of Care (Signed)

## 2020-08-15 NOTE — Discharge Summary (Signed)
Physician Discharge Summary  Jessica Dennis [id number] DOB: 05/03/1932 DOA: 08/08/2020  PCP: Jessica Dad, MD  Admit date: 08/08/2020 Discharge date: 08/15/2020  Time spent: 60 minutes  Recommendations for Outpatient Follow-up:  Follow-up with MD at skilled nursing facility.  Patient will need a basic metabolic profile done in 1 week to follow-up on electrolytes and renal function. Follow-up with Jessica Dad, MD in 2 weeks.  On follow-up patient's diuretics and spironolactone and Demadex will need to be reassessed at these were resumed at half home dose and started 1 week post discharge.  Patient will also need a basic metabolic profile done to follow-up on electrolytes and renal function.   Discharge Diagnoses:  Principal Problem:   Sepsis secondary to UTI [facility]) Active Problems:   Essential hypertension, benign   Hypothyroidism   GERD without esophagitis   Alcoholic cirrhosis of liver with ascites (HCC)   Chronic diastolic (congestive) heart failure (HCC)   Pancreatic cyst   Fall at home, initial encounter   Lactic acidosis   Mixed hyperlipidemia due to type 2 diabetes mellitus ([place])   Bacteremia due to Gram-positive bacteria   Discharge Condition: Stable and improved  Diet recommendation: Heart healthy  Filed Weights   08/08/20 1742  Weight: 83.9 kg    History of present illness:  HPI per Dr. Cyd Silence 87 year old female with past medical history of alcoholic liver cirrhosis, insulin-dependent diabetes mellitus type 2, diabetic neuropathy, diastolic congestive heart failure (Echo 11/2019 EF 60-65%), hypertension, hyperlipidemia, hypothyroidism, gastroesophageal reflux disease who presented to [facility] emergency department brought in by EMS from friend's home assisted living facility due to being found down on the bathroom floor.   Patient is a somewhat poor historian presumably secondary to acute infection.  Attempts have been made to contact the son and  have been unsuccessful although he was present at the independent living facility at the time of EMS arrival.  Patient explains that she has not felt well the past several days.  She complains of generalized weakness and poor appetite.  Patient also complains of vague generalized abdominal discomfort.   Patient was found sitting on the floor in her bathroom by son and staff.  Patient was seemingly tired and lethargic and was noted to be febrile by the staff.  EMS was contacted and on upon arrival noted the patient had a fever of 102.31F.  Patient was also noted to be incontinent of urine.  Patient was then promptly brought into [facility] emergency department for evaluation.   Upon evaluation in the emergency department patient was noted to have recurrent fevers throughout the emergency department course.  Patient was noted to have multiple sirs criteria with clinical evidence of encephalopathy and suspected urinary tract infection all concerning for developing sepsis.  Urinalysis was only found to be slightly abnormal with rare bacteria trace leukocytes and 6-10 white blood cells per high-powered field.  Patient was initiated on intravenous ceftriaxone for suspected complicated urinary tract infection.  CT imaging of the abdomen pelvis was performed which was unremarkable.  Due to concerns for urinary tract infection and sepsis the hospitalist group was then called to assess the patient for admission to the hospital.  Hospital Course:  1 sepsis secondary to Enterococcus Avium UTI and Streptococcus gordonii bacteremia -Patient admitted with multiple SIRS criteria in the setting of initially suspected UTI with evidence of encephalopathy, lactic acidosis with concerns for developing sepsis. -Urinalysis not quite compelling for UTI however initially felt on admission to be  likely etiology of patient's source of infection. -Chest x-ray done was negative for any evidence of pneumonia. -COVID-19 PCR  negative. -Blood cultures obtained with Streptococcus gordonii and sensitive to penicillin, Rocephin, Levaquin, vancomycin, resistant to erythromycin. -2D echo ordered, which was negative for vegetations with normal EF, no wall motion abnormalities.  -IV Rocephin narrowed to IV penicillin. -Patient seen in consultation by ID who are recommending continuation of penicillin and could likely transition to oral amoxicillin 3 times daily through 08/14/2020 to complete a 7-day course of antibiotic treatment. -Patient completed full course of antibiotic treatment during the hospitalization no further antibiotics will be required on discharge.  2.  Enterococcus Avium UTI -Urine cultures with > 100,000 colonies of Enterococcus Avium sensitive to the ampicillin's, vancomycin, Bactrim.   -Patient completed full course of antibiotic treatment during the hospitalization.  No further antibiotics required on discharge.    3.  Probable Streptococcus bacteremia -Blood cultures growing Streptococcus gordonii with resistance to erythromycin, sensitive to penicillin, Rocephin, Levaquin, vancomycin.   -2D echo ordered negative for vegetations.  -IV Rocephin was narrowed to IV penicillin. -Patient seen in consultation by ID who are recommened continuation of current IV antibiotics of penicillin and could likely transition to oral amoxicillin 3 times a day through 08/14/2020. -Patient completed a full course of antibiotic treatment during the hospitalization no further antibiotics will be required on discharge. -Per ID no indication for TEE.  4.  Acute metabolic encephalopathy -Likely secondary to problems #1, 2, 3. -Clinical improvement.   -Likely at baseline by day of discharge.  5.  Lactic acidosis -Felt secondary to volume depletion and infection. -Lactic acid levels have trended down.   -Status post full course of antibiotics during the hospitalization.   -Outpatient follow-up.    6.   Hypertension -Controlled on Toprol-XL. -Patient spironolactone, Demadex were held during the hospitalization will be resumed at half home dose 1 week post discharge.  7.  Hypothyroidism -Patient maintained on home regimen Synthroid  8.  Diabetes mellitus type 2 -Hemoglobin A1c 6.4. -Patient's Lantus dose was adjusted during the hospitalization such that by day of discharge patient's blood glucose levels well controlled on Lantus 22 units in the morning, 32 units at bedtime as well as NovoLog 6 units 3 times daily with meals and SSI.   -Patient's metformin and Januvia were held during the hospitalization and will be resumed on discharge.   -Outpatient follow-up with PCP.    9.  History of alcoholic cirrhosis -No evidence of hepatic encephalopathy.   -Patient maintained on lactulose during the hospitalization.  -Patient spironolactone and Demadex were held and will be resumed at half home dose 1 week post discharge.   -Outpatient follow-up.  10.  Chronic diastolic CHF -Patient with improvement with lower extremity edema.   -Diuretics on hold of spironolactone and Demadex due to volume resuscitation and lactic acidosis on presentation. -Patient's diuretics will be resumed at half home dose 1 week post discharge.  Outpatient follow-up with PCP.    11.  Pancreatic cyst -Noted on CT. -Outpatient follow-up.  74.  Fall -PT/OT assessed patient and recommended SNF.  13.  GERD -Patient maintained on PPI.    Procedures: CT abdomen and pelvis 08/09/2020 Chest x-ray 08/08/2020 2D echo 08/12/2020  Consultations: ID: Dr Linus Salmons 08/12/2020 Oncology: Dr. Marin Olp  Discharge Exam: Vitals:   08/15/20 0632 08/15/20 1258  BP: (!) 124/55 117/69  Pulse: (!) 107 98  Resp: 20   Temp: 98 F (36.7 C) 98.2 F (36.8 C)  SpO2:  99% 98%    General: NAD Cardiovascular: RRR no murmurs rubs or gallops.  No JVD.  No lower extremity edema. Respiratory: Clear to auscultation bilaterally.  No wheezes, no  crackles, no rhonchi.  Discharge Instructions   Discharge Instructions     Diet - low sodium heart healthy   Complete by: As directed    Increase activity slowly   Complete by: As directed       Allergies as of 08/15/2020       Reactions   Tizanidine Other (See Comments)   Hallucinate, confused         Medication List     TAKE these medications    Accu-Chek FastClix Lancets Misc CHECK FOR BLOOD SUGAR DAILY   acetaminophen 325 MG tablet Commonly known as: TYLENOL Take 650 mg by mouth every 6 (six) hours as needed.   folic acid 1 MG tablet Commonly known as: FOLVITE Take 1 tablet (1 mg total) by mouth daily.   glucose blood test strip 1 each by Other route 2 (two) times daily. Use as instructed-Check blood glucose prior to administering insulin   insulin aspart 100 UNIT/ML injection Commonly known as: novoLOG Inject 10 Units into the skin 3 (three) times daily. CBG more then 100   insulin glargine 100 UNIT/ML injection Commonly known as: LANTUS Inject 0.22 mLs (22 Units total) into the skin every morning. What changed: how much to take   insulin glargine 100 UNIT/ML injection Commonly known as: LANTUS Inject 0.32 mLs (32 Units total) into the skin at bedtime. What changed: how much to take   lactulose 10 GM/15ML solution Commonly known as: CHRONULAC Take 20 g by mouth daily.   latanoprost 0.005 % ophthalmic solution Commonly known as: XALATAN Place 1 drop into both eyes at bedtime.   levothyroxine 75 MCG tablet Commonly known as: SYNTHROID Take 1 tablet (75 mcg total) by mouth daily.   LORazepam 0.5 MG tablet Commonly known as: ATIVAN Take 1 tablet (0.5 mg total) by mouth at bedtime.   metFORMIN 500 MG 24 hr tablet Commonly known as: GLUCOPHAGE-XR Take 1 tablet (500 mg total) by mouth daily.   metoprolol succinate 25 MG 24 hr tablet Commonly known as: TOPROL-XL Take 12.5 mg by mouth daily.   nystatin powder Commonly known as:  MYCOSTATIN/NYSTOP Apply 1 application topically 2 (two) times daily.   pantoprazole 40 MG tablet Commonly known as: PROTONIX Take 1 tablet (40 mg total) by mouth daily.   polyethylene glycol 17 g packet Commonly known as: MIRALAX / GLYCOLAX Take 17 g by mouth 2 (two) times daily.   potassium chloride SA 20 MEQ tablet Commonly known as: KLOR-CON Take 1 tablet (20 mEq total) by mouth daily. Start taking on: August 22, 2020 What changed: These instructions start on August 22, 2020. If you are unsure what to do until then, ask your doctor or other care provider.   senna-docusate 8.6-50 MG tablet Commonly known as: Senokot-S Take 1 tablet by mouth 2 (two) times daily.   simvastatin 20 MG tablet Commonly known as: ZOCOR TAKE ONE TABLET BY MOUTH EVERY NIGHT AT BEDTIME   sitaGLIPtin 50 MG tablet Commonly known as: JANUVIA Take 50 mg by mouth daily. What changed: Another medication with the same name was removed. Continue taking this medication, and follow the directions you see here.   spironolactone 25 MG tablet Commonly known as: Aldactone Take 1 tablet (25 mg total) by mouth daily. Start taking on: August 22, 2020 What changed:  medication  strength how much to take These instructions start on August 22, 2020. If you are unsure what to do until then, ask your doctor or other care provider.   torsemide 20 MG tablet Commonly known as: DEMADEX Take 1 tablet (20 mg total) by mouth daily. Start taking on: August 22, 2020 What changed:  how much to take These instructions start on August 22, 2020. If you are unsure what to do until then, ask your doctor or other care provider.   vitamin C 1000 MG tablet Take 1,000 mg by mouth at bedtime.   vitamin E 180 MG (400 UNITS) capsule Take 400 Units by mouth every evening.       Allergies  Allergen Reactions   Tizanidine Other (See Comments)    Hallucinate, confused     Follow-up Information     Jessica Dad, MD. Schedule an  appointment as soon as possible for a visit in 2 week(s).   Specialty: Internal Medicine Contact information: [place] [phone]         Berniece Salines, DO .   Specialty: Cardiology Contact information: [place] [address] [phone]         MD AT SNF Follow up.                   The results of significant diagnostics from this hospitalization (including imaging, microbiology, ancillary and laboratory) are listed below for reference.    Significant Diagnostic Studies: CT ABDOMEN PELVIS W CONTRAST  Result Date: 08/09/2020 CLINICAL DATA:  Abdominal pain EXAM: CT ABDOMEN AND PELVIS WITH CONTRAST TECHNIQUE: Multidetector CT imaging of the abdomen and pelvis was performed using the standard protocol following bolus administration of intravenous contrast. CONTRAST:  27m OMNIPAQUE IOHEXOL 350 MG/ML SOLN COMPARISON:  01/12/2020 FINDINGS: LOWER CHEST: Right pleural effusion with basilar atelectasis HEPATOBILIARY: Diffusely nodular hepatic contours with relative hypertrophy of the caudate and left hepatic lobe, consistent with hepatic cirrhosis. No focal liver lesion. No biliary dilatation. Distended gallbladder PANCREAS: 12 mm cystic focus in the distal pancreatic body, slightly larger than on 01/12/2020. SPLEEN: Spleen is enlarged, measuring 16.7 cm in craniocaudal dimension. ADRENALS/URINARY TRACT: The adrenal glands are normal. No hydronephrosis, nephroureterolithiasis or solid renal mass. The urinary bladder is normal for degree of distention STOMACH/BOWEL: There is no hiatal hernia. Normal duodenal course and caliber. No small bowel dilatation or inflammation. Rectosigmoid diverticulosis without acute inflammation. Normal appendix. VASCULAR/LYMPHATIC: There is calcific atherosclerosis of the abdominal aorta. No lymphadenopathy. Recanalized umbilical vein. REPRODUCTIVE: Status post hysterectomy. No adnexal mass.  MUSCULOSKELETAL. Bilateral hip arthroplasties OTHER: None. IMPRESSION: 1. No acute abnormality of the abdomen or pelvis. 2. Hepatic cirrhosis with evidence of portal hypertension. 3. Right pleural effusion with basilar atelectasis. 4. 12 mm cystic focus in the distal pancreatic body. Recommend follow up pre and post contrast MRI/MRCP or pancreatic protocol CT in 2 years. This recommendation follows ACR consensus guidelines: Management of Incidental Pancreatic Cysts: A White Paper of the ACR Incidental Findings Committee. [id number] Aortic Atherosclerosis (ICD10-I70.0). Electronically Signed   By: KUlyses JarredM.D.   On: 08/09/2020 03:50   DG Chest Portable 1 View  Result Date: 08/08/2020 CLINICAL DATA:  Rhonchi fever EXAM: PORTABLE CHEST 1 VIEW COMPARISON:  02/23/2020 FINDINGS: The heart size and mediastinal contours are within normal limits. Aortic atherosclerosis. Both lungs are clear. The visualized skeletal structures are unremarkable. IMPRESSION: No active disease. Electronically Signed   By:  Donavan Foil M.D.   On: 08/08/2020 18:33   ECHOCARDIOGRAM COMPLETE  Result Date: 08/12/2020    ECHOCARDIOGRAM REPORT   Patient Name:   BRIN HAUPTMAN Date of Exam: 08/12/2020 Medical Rec #:  [id number]      Height:       66.0 in Accession #:    [id number]     Weight:       185.0 lb Date of Birth:  01/13/1933     BSA:          1.935 m Patient Age:    16 years       BP:           112/62 mmHg Patient Gender: F              HR:           112 bpm. Exam Location:  Inpatient Procedure: 2D Echo, Color Doppler and Cardiac Doppler Indications:    Bacteremia R78.81  History:        Patient has prior history of Echocardiogram examinations, most                 recent 11/27/2019. CHF; Risk Factors:Hypertension, Diabetes and                 Dyslipidemia.  Sonographer:    Raquel Sarna Senior RDCS Referring Phys: [place]  Sonographer Comments: Technically difficult due to poor echo windows.  IMPRESSIONS  1. Technically difficult; vegetation cannot be excluded with this study; if clinical suspicion high, suggest TEE to further assess.  2. Left ventricular ejection fraction, by estimation, is 60 to 65%. The left ventricle has normal function. The left ventricle has no regional wall motion abnormalities. There is mild left ventricular hypertrophy of the basal-septal segment. Left ventricular diastolic parameters are consistent with Grade I diastolic dysfunction (impaired relaxation).  3. Right ventricular systolic function is normal. The right ventricular size is normal.  4. The mitral valve is normal in structure. No evidence of mitral valve regurgitation. No evidence of mitral stenosis.  5. The aortic valve has an indeterminant number of cusps. Aortic valve regurgitation is not visualized. No aortic stenosis is present.  6. The inferior vena cava is normal in size with greater than 50% respiratory variability, suggesting right atrial pressure of 3 mmHg. FINDINGS  Left Ventricle: Left ventricular ejection fraction, by estimation, is 60 to 65%. The left ventricle has normal function. The left ventricle has no regional wall motion abnormalities. The left ventricular internal cavity size was normal in size. There is  mild left ventricular hypertrophy of the basal-septal segment. Left ventricular diastolic parameters are consistent with Grade I diastolic dysfunction (impaired relaxation). Right Ventricle: The right ventricular size is normal. Right ventricular systolic function is normal. Left Atrium: Left atrial size was normal in size. Right Atrium: Right atrial size was normal in size. Pericardium: There is no evidence of pericardial effusion. Mitral Valve: The mitral valve is normal in structure. Mild mitral annular calcification. No evidence of mitral valve regurgitation. No evidence of mitral valve stenosis. Tricuspid Valve: The tricuspid valve is normal in structure. Tricuspid valve regurgitation is  not demonstrated. No evidence of tricuspid stenosis. Aortic Valve: The aortic valve has an indeterminant number of cusps. Aortic valve regurgitation is not visualized. No aortic stenosis is present. Pulmonic Valve: The pulmonic valve was not well visualized. Pulmonic valve regurgitation is not visualized. No evidence of pulmonic stenosis. Aorta: The aortic root is normal in size and structure. Venous:  The inferior vena cava is normal in size with greater than 50% respiratory variability, suggesting right atrial pressure of 3 mmHg. IAS/Shunts: The interatrial septum was not well visualized. Additional Comments: Technically difficult; vegetation cannot be excluded with this study; if clinical suspicion high, suggest TEE to further assess.  LEFT VENTRICLE PLAX 2D LVIDd:         3.20 cm LVIDs:         1.90 cm LV PW:         1.00 cm LV IVS:        1.20 cm LVOT diam:     1.80 cm LV SV:         48 LV SV Index:   25 LVOT Area:     2.54 cm  RIGHT VENTRICLE RV S prime:     11.20 cm/s TAPSE (M-mode): 1.9 cm LEFT ATRIUM             Index LA diam:        3.00 cm 1.55 cm/m LA Vol (A2C):   54.6 ml 28.22 ml/m LA Vol (A4C):   38.0 ml 19.64 ml/m LA Biplane Vol: 47.2 ml 24.40 ml/m  AORTIC VALVE LVOT Vmax:   94.40 cm/s LVOT Vmean:  68.400 cm/s LVOT VTI:    0.190 m  AORTA Ao Root diam: 3.10 cm MITRAL VALVE MV Area (PHT): 2.96 cm    SHUNTS MV Decel Time: 256 msec    Systemic VTI:  0.19 m MV E velocity: 68.30 cm/s  Systemic Diam: 1.80 cm MV A velocity: 94.70 cm/s MV E/A ratio:  0.72 Kirk Ruths MD Electronically signed by Kirk Ruths MD Signature Date/Time: 08/12/2020/2:25:59 PM    Final     Microbiology: Recent Results (from the past 240 hour(s))  Blood culture (routine x 2)     Status: Abnormal   Collection Time: 08/08/20  5:50 PM   Specimen: BLOOD  Result Value Ref Range Status   Specimen Description   Final    BLOOD RIGHT ANTECUBITAL Performed at [facility], [place] [address], [place],  [place] [address]    Special Requests   Final    BOTTLES DRAWN AEROBIC AND ANAEROBIC Blood Culture adequate volume Performed at [facility] [address] [place], [place] [address]    Culture  Setup Time   Final    GRAM POSITIVE COCCI IN CHAINS ANAEROBIC BOTTLE ONLY CRITICAL RESULT CALLED TO, READ BACK BY AND VERIFIED WITHGuadlupe Spanish PHARMD 1902 08/09/20 A BROWNING Performed at [facility], [place] [address], [place], [place] [address]    Culture STREPTOCOCCUS GORDONII (A)  Final   Report Status 08/11/2020 FINAL  Final   Organism ID, Bacteria STREPTOCOCCUS GORDONII  Final      Susceptibility   Streptococcus gordonii - MIC*    PENICILLIN <=0.06 SENSITIVE Sensitive     CEFTRIAXONE <=0.12 SENSITIVE Sensitive     ERYTHROMYCIN 4 RESISTANT Resistant     LEVOFLOXACIN 0.5 SENSITIVE Sensitive     VANCOMYCIN 0.5 SENSITIVE Sensitive     * STREPTOCOCCUS GORDONII  Blood Culture ID Panel (Reflexed)     Status: Abnormal   Collection Time: 08/08/20  5:50 PM  Result Value Ref Range Status   Enterococcus faecalis NOT DETECTED NOT DETECTED Final   Enterococcus Faecium NOT DETECTED NOT DETECTED Final   Listeria monocytogenes NOT DETECTED NOT DETECTED Final   Staphylococcus species NOT DETECTED NOT DETECTED Final   Staphylococcus aureus (BCID) NOT DETECTED NOT DETECTED Final  Staphylococcus epidermidis NOT DETECTED NOT DETECTED Final   Staphylococcus lugdunensis NOT DETECTED NOT DETECTED Final   Streptococcus species DETECTED (A) NOT DETECTED Final    Comment: Not Enterococcus species, Streptococcus agalactiae, Streptococcus pyogenes, or Streptococcus pneumoniae. CRITICAL RESULT CALLED TO, READ BACK BY AND VERIFIED WITH: N GLOGOVAC PHARMD 1902 08/09/20 A BROWNING    Streptococcus agalactiae NOT DETECTED NOT DETECTED Final   Streptococcus pneumoniae NOT DETECTED NOT DETECTED Final   Streptococcus pyogenes NOT DETECTED NOT DETECTED Final   A.calcoaceticus-baumannii NOT DETECTED  NOT DETECTED Final   Bacteroides fragilis NOT DETECTED NOT DETECTED Final   Enterobacterales NOT DETECTED NOT DETECTED Final   Enterobacter cloacae complex NOT DETECTED NOT DETECTED Final   Escherichia coli NOT DETECTED NOT DETECTED Final   Klebsiella aerogenes NOT DETECTED NOT DETECTED Final   Klebsiella oxytoca NOT DETECTED NOT DETECTED Final   Klebsiella pneumoniae NOT DETECTED NOT DETECTED Final   Proteus species NOT DETECTED NOT DETECTED Final   Salmonella species NOT DETECTED NOT DETECTED Final   Serratia marcescens NOT DETECTED NOT DETECTED Final   Haemophilus influenzae NOT DETECTED NOT DETECTED Final   Neisseria meningitidis NOT DETECTED NOT DETECTED Final   Pseudomonas aeruginosa NOT DETECTED NOT DETECTED Final   Stenotrophomonas maltophilia NOT DETECTED NOT DETECTED Final   Candida albicans NOT DETECTED NOT DETECTED Final   Candida auris NOT DETECTED NOT DETECTED Final   Candida glabrata NOT DETECTED NOT DETECTED Final   Candida krusei NOT DETECTED NOT DETECTED Final   Candida parapsilosis NOT DETECTED NOT DETECTED Final   Candida tropicalis NOT DETECTED NOT DETECTED Final   Cryptococcus neoformans/gattii NOT DETECTED NOT DETECTED Final    Comment: Performed at [facility], [address], [place], [place] [address]  Blood culture (routine x 2)     Status: None   Collection Time: 08/08/20  5:55 PM   Specimen: BLOOD  Result Value Ref Range Status   Specimen Description   Final    BLOOD BLOOD RIGHT HAND Performed at [facility] [address] [place], [place] [address]    Special Requests   Final    BOTTLES DRAWN AEROBIC AND ANAEROBIC Blood Culture adequate volume Performed at [facility] [address] [place], [place] [address]    Culture   Final    NO GROWTH 5 DAYS Performed at [facility], [place] [address] [place], [place] [address]    Report Status 08/14/2020 FINAL  Final  Resp Panel by RT-PCR (Flu A&B,  Covid) Nasopharyngeal Swab     Status: None   Collection Time: 08/08/20  9:01 PM   Specimen: Nasopharyngeal Swab; Nasopharyngeal(NP) swabs in vial transport medium  Result Value Ref Range Status   SARS Coronavirus 2 by RT PCR NEGATIVE NEGATIVE Final    Comment: (NOTE) SARS-CoV-2 target nucleic acids are NOT DETECTED.  The SARS-CoV-2 RNA is generally detectable in upper respiratory specimens during the acute phase of infection. The lowest concentration of SARS-CoV-2 viral copies this assay can detect is 138 copies/mL. A negative result does not preclude SARS-Cov-2 infection and should not be used as the sole basis for treatment or other patient management decisions. A negative result may occur with  improper specimen collection/handling, submission of specimen other than nasopharyngeal swab, presence of viral mutation(s) within the areas targeted by this assay, and inadequate number of viral copies(<138 copies/mL). A negative result must be combined with clinical observations, patient history, and epidemiological information. The expected result is Negative.  Fact Sheet for Patients:  EntrepreneurPulse.com.au  Fact Sheet for Healthcare Providers:  IncredibleEmployment.be  This test is no t yet approved or cleared by the [place] FDA and  has been authorized for detection and/or diagnosis of SARS-CoV-2 by FDA under an Emergency Use Authorization (EUA). This EUA will remain  in effect (meaning this test can be used) for the duration of the COVID-19 declaration under Section 564(b)(1) of the Act, 21 U.S.C.section 360bbb-3(b)(1), unless the authorization is terminated  or revoked sooner.       Influenza A by PCR NEGATIVE NEGATIVE Final   Influenza B by PCR NEGATIVE NEGATIVE Final    Comment: (NOTE) The Xpert Xpress SARS-CoV-2/FLU/RSV plus assay is intended as an aid in the diagnosis of influenza from Nasopharyngeal swab specimens and should  not be used as a sole basis for treatment. Nasal washings and aspirates are unacceptable for Xpert Xpress SARS-CoV-2/FLU/RSV testing.  Fact Sheet for Patients: EntrepreneurPulse.com.au  Fact Sheet for Healthcare Providers: IncredibleEmployment.be  This test is not yet approved or cleared by the [place] FDA and has been authorized for detection and/or diagnosis of SARS-CoV-2 by FDA under an Emergency Use Authorization (EUA). This EUA will remain in effect (meaning this test can be used) for the duration of the COVID-19 declaration under Section 564(b)(1) of the Act, 21 U.S.C. section 360bbb-3(b)(1), unless the authorization is terminated or revoked.  Performed at [facility], [place] [address] [place], [place] [address]   Urine Culture     Status: Abnormal   Collection Time: 08/09/20 12:19 AM   Specimen: Urine, Clean Catch  Result Value Ref Range Status   Specimen Description   Final    URINE, CLEAN CATCH Performed at [facility], [place] [address] [place], [place] [address]    Special Requests   Final    NONE Performed at [facility], [place] [address] [place], [place] [address]    Culture >=100,000 COLONIES/mL ENTEROCOCCUS AVIUM (A)  Final   Report Status 08/11/2020 FINAL  Final   Organism ID, Bacteria ENTEROCOCCUS AVIUM (A)  Final      Susceptibility   Enterococcus avium - MIC*    AMPICILLIN <=2 SENSITIVE Sensitive     NITROFURANTOIN 32 SENSITIVE Sensitive     VANCOMYCIN <=0.5 SENSITIVE Sensitive     * >=100,000 COLONIES/mL ENTEROCOCCUS AVIUM  SARS CORONAVIRUS 2 (TAT 6-24 HRS) Nasopharyngeal Nasopharyngeal Swab     Status: None   Collection Time: 08/14/20  6:14 PM   Specimen: Nasopharyngeal Swab  Result Value Ref Range Status   SARS Coronavirus 2 NEGATIVE NEGATIVE Final    Comment: (NOTE) SARS-CoV-2 target nucleic acids are NOT DETECTED.  The SARS-CoV-2 RNA is generally  detectable in upper and lower respiratory specimens during the acute phase of infection. Negative results do not preclude SARS-CoV-2 infection, do not rule out co-infections with other pathogens, and should not be used as the sole basis for treatment or other patient management decisions. Negative results must be combined with clinical observations, patient history, and epidemiological information. The expected result is Negative.  Fact Sheet for Patients: SugarRoll.be  Fact Sheet for Healthcare Providers: https://www.woods-mathews.com/  This test is not yet approved or cleared by the [place] FDA and  has been authorized for detection and/or diagnosis of SARS-CoV-2 by FDA under an Emergency Use Authorization (EUA). This EUA will remain  in effect (meaning this test can be used) for the duration of the COVID-19 declaration under Se ction 564(b)(1) of the  Act, 21 U.S.C. section 360bbb-3(b)(1), unless the authorization is terminated or revoked sooner.  Performed at [facility], [place] [address], [place], [place] [address]      Labs: Basic Metabolic Panel: Recent Labs  Lab 08/09/20 0612 08/10/20 0413 08/11/20 0442 08/12/20 0424 08/13/20 0410 08/14/20 0535 08/15/20 0421  NA 134*   < > 134* 133* 133* 134* 134*  K 4.2   < > 3.9 4.2 3.8 3.6 3.8  CL 100   < > 102 103 100 105 103  CO2 22   < > '24 24 25 25 25  '$ GLUCOSE 206*   < > 231* 254* 247* 168* 186*  BUN 20   < > '16 12 13 14 15  '$ CREATININE 1.24*   < > 0.82 0.83 0.82 0.83 0.97  CALCIUM 9.2   < > 8.7* 8.6* 8.7* 8.5* 8.3*  MG 1.9  --  2.0  --  1.8 1.9  --    < > = values in this interval not displayed.   Liver Function Tests: Recent Labs  Lab 08/09/20 0612 08/10/20 0413 08/11/20 0442 08/12/20 0424 08/13/20 0410  AST 234* 183* 152* 143* 94*  ALT 43 39 42 50* 43  ALKPHOS 89 69 71 98 91  BILITOT 2.6* 1.6* 1.4* 1.6* 1.3*  PROT 5.9* 5.4* 5.2* 5.2* 5.3*  ALBUMIN 3.1*  2.7* 2.6* 2.6* 2.6*   Recent Labs  Lab 08/08/20 1748  LIPASE 50   Recent Labs  Lab 08/08/20 1750  AMMONIA 31   CBC: Recent Labs  Lab 08/08/20 1748 08/09/20 0612 08/10/20 0413 08/11/20 0442 08/12/20 0424 08/13/20 0410 08/14/20 0535  WBC 13.2* 8.6 6.2 3.7* 3.2* 3.5* 4.4  NEUTROABS 11.9* 7.8*  --   --  2.2 2.3  --   HGB 10.3* 9.7* 9.1* 9.2* 9.3* 9.7* 9.3*  HCT 31.4* 29.6* 27.5* 28.6* 28.7* 29.5* 29.4*  MCV 93.2 93.4 92.6 92.6 92.3 92.2 93.9  PLT 66* 52* 42* 41* 49* 50* 56*   Cardiac Enzymes: No results for input(s): CKTOTAL, CKMB, CKMBINDEX, TROPONINI in the last 168 hours. BNP: BNP (last 3 results) Recent Labs    11/14/19 1136 01/13/20 0610 08/08/20 1748  BNP 112.0* 199.4* 85.1    ProBNP (last 3 results) Recent Labs    01/12/20 1241  PROBNP 195.0*    CBG: Recent Labs  Lab 08/14/20 1125 08/14/20 1558 08/14/20 2057 08/15/20 0729 08/15/20 1302  GLUCAP 234* 162* 256* 150* 290*       Signed:  Irine Seal MD.  [facility] 08/15/2020, 2:11 PM

## 2020-08-15 NOTE — Progress Notes (Signed)
Physical Therapy Treatment Patient Details Name: Lidiana Saffran MRN: [id number] DOB: [birth year] Today's Date: 08/15/2020    History of Present Illness 87 year old female with past medical history of alcoholic liver cirrhosis, insulin-dependent diabetes mellitus type 2, diabetic neuropathy, diastolic congestive heart failure , hypertension, hyperlipidemia, hypothyroidism, gastroesophageal reflux disease who presents to ED , [facility] due to being found down on the bathroom floor. Patient was noted to have fever,multiple sirs criteria with clinical evidence of encephalopathy and suspected urinary tract infection all concerning for developing sepsis,    PT Comments    Progressing with mobility. Continue to recommend SNF.    Follow Up Recommendations  SNF     Equipment Recommendations  None recommended by PT    Recommendations for Other Services       Precautions / Restrictions Precautions Precautions: Fall Precaution Comments: incontinence, use depends/ pad and panty Restrictions Weight Bearing Restrictions: No    Mobility  Bed Mobility Overal bed mobility: Needs Assistance Bed Mobility: Supine to Sit     Supine to sit: Mod assist;HOB elevated     General bed mobility comments: Assist for trunk and LEs. Increased time. Cues required.    Transfers Overall transfer level: Needs assistance Equipment used: Rolling walker (2 wheeled) Transfers: Sit to/from Stand Sit to Stand: Min assist Stand pivot transfers: Min assist       General transfer comment: steady assist to rise, using RW.  Ambulation/Gait Ambulation/Gait assistance: Min assist Gait Distance (Feet): 5 Feet Assistive device: Rolling walker (2 wheeled) Gait Pattern/deviations: Step-through pattern;Decreased stride length     General Gait Details: Assist to stabilize and manage RW. Cues for safety. Fatigues fairly easily.   Stairs              Wheelchair Mobility    Modified Rankin (Stroke Patients Only)       Balance                                            Cognition Arousal/Alertness: Awake/alert Behavior During Therapy: WFL for tasks assessed/performed Overall Cognitive Status: No family/caregiver present to determine baseline cognitive functioning                       Memory: Decreased short-term memory Following Commands: Follows one step commands with increased time              Exercises      General Comments        Pertinent Vitals/Pain Pain Assessment: Faces Faces Pain Scale: No hurt    Home Living                      Prior Function            PT Goals (current goals can now be found in the care plan section) Progress towards PT goals: Progressing toward goals    Frequency    Min 2X/week      PT Plan Current plan remains appropriate    Co-evaluation              AM-PAC PT "6 Clicks" Mobility   Outcome Measure  Help needed turning from your back to your side while in a flat bed without using bedrails?: A Little Help needed moving from lying on your back to sitting on the  side of a flat bed without using bedrails?: A Lot Help needed moving to and from a bed to a chair (including a wheelchair)?: A Little Help needed standing up from a chair using your arms (e.g., wheelchair or bedside chair)?: A Little Help needed to walk in hospital room?: A Little Help needed climbing 3-5 steps with a railing? : A Lot 6 Click Score: 16    End of Session   Activity Tolerance: Patient tolerated treatment well Patient left: in chair;with call bell/phone within reach;with chair alarm set   PT Visit Diagnosis: Muscle weakness (generalized) (M62.81)     Time: [id number] PT Time Calculation (min) (ACUTE ONLY): 15 min  Charges:  $Gait Training: 8-22 mins                         Doreatha Massed, PT Acute Rehabilitation  Office:  [phone] Pager: [phone]

## 2020-08-15 NOTE — TOC Transition Note (Signed)
Transition of Care [facility]) - CM/SW Discharge Note   Patient Details  Name: Rainna Estudillo MRN: [id number] Date of Birth: 1932/02/04  [facility] CM/SW Contact:  Dessa Phi, RN Phone Number: 08/15/2020, 3:33 PM   Clinical Narrative: d/c today [facility] rep Vevelyn Francois to rm#[phone],nsg call report tel#[phone]. PTR scheduled for 5:30p pick up. PTAR#auth [id number],auth#[id number] forms @ nsg station. No further CM needs.      Final next level of care: Skilled Nursing Facility Barriers to Discharge: No Barriers Identified   Patient Goals and CMS Choice Patient states their goals for this hospitalization and ongoing recovery are:: go to rehab CMS Medicare.gov Compare Post Acute Care list provided to:: Patient Represenative (must comment) (dtr in law-[name]/son Marcello Moores) Choice offered to / list presented to : Adult Children  Discharge Placement              Patient chooses bed at: [facility] Patient to be transferred to facility by: [place] Name of family member notified: Sharee Pimple dtr n law [phone] Patient and family notified of of transfer: 08/15/20  Discharge Plan and Services   Discharge Planning Services: CM Consult Post Acute Care Choice: [facility]                               Social Determinants of Health (SDOH) Interventions     Readmission Risk Interventions No flowsheet data found.

## 2020-08-16 ENCOUNTER — Other Ambulatory Visit: Payer: Self-pay

## 2020-08-16 ENCOUNTER — Encounter: Payer: Self-pay | Admitting: Internal Medicine

## 2020-08-16 ENCOUNTER — Non-Acute Institutional Stay (SKILLED_NURSING_FACILITY): Payer: HMO | Admitting: Internal Medicine

## 2020-08-16 DIAGNOSIS — Z794 Long term (current) use of insulin: Secondary | ICD-10-CM

## 2020-08-16 DIAGNOSIS — N1832 Chronic kidney disease, stage 3b: Secondary | ICD-10-CM

## 2020-08-16 DIAGNOSIS — E039 Hypothyroidism, unspecified: Secondary | ICD-10-CM

## 2020-08-16 DIAGNOSIS — N3000 Acute cystitis without hematuria: Secondary | ICD-10-CM

## 2020-08-16 DIAGNOSIS — A419 Sepsis, unspecified organism: Secondary | ICD-10-CM | POA: Diagnosis not present

## 2020-08-16 DIAGNOSIS — D631 Anemia in chronic kidney disease: Secondary | ICD-10-CM

## 2020-08-16 DIAGNOSIS — E1165 Type 2 diabetes mellitus with hyperglycemia: Secondary | ICD-10-CM

## 2020-08-16 DIAGNOSIS — K219 Gastro-esophageal reflux disease without esophagitis: Secondary | ICD-10-CM

## 2020-08-16 DIAGNOSIS — I482 Chronic atrial fibrillation, unspecified: Secondary | ICD-10-CM

## 2020-08-16 DIAGNOSIS — I5032 Chronic diastolic (congestive) heart failure: Secondary | ICD-10-CM

## 2020-08-16 DIAGNOSIS — I10 Essential (primary) hypertension: Secondary | ICD-10-CM

## 2020-08-16 MED ORDER — LORAZEPAM 0.5 MG PO TABS: 0.5000 mg | ORAL_TABLET | Freq: Every day | ORAL | 0 refills | Status: DC

## 2020-08-16 NOTE — Progress Notes (Signed)
Provider:  Veleta Miners MD Location:   [facility] Room Number: 34 Place of Service:  SNF (31)  PCP: Virgie Dad, MD Patient Care Team: Virgie Dad, MD as PCP - General (Internal Medicine) Berniece Salines, DO as PCP - Cardiology (Cardiology) Sheryn Bison, MD as Referring Physician (Dermatology) Gatha Mayer, MD as Consulting Physician (Gastroenterology) Marin Olp Rudell Cobb, MD as Consulting Physician (Oncology) Janan Ridge, MD as Consulting Physician (Dermatology) Paralee Cancel, MD as Consulting Physician (Orthopedic Surgery) Jacelyn Pi, MD as Consulting Physician (Endocrinology) Day, Melvenia Beam, [facility] (Inactive) as Pharmacist (Pharmacist) Berniece Salines, DO as Consulting Physician (Cardiology)  Extended Emergency Contact Information Primary Emergency Contact: Trinidad Curet [place] Phone: [phone] Mobile Phone: [phone] Relation: Son Secondary Emergency Contact: Gladies, [place] Mobile Phone: [phone] Relation: Relative Preferred language: English Interpreter needed? No  Code Status: DNR Goals of Care: Advanced Directive information Advanced Directives 08/16/2020  Does Patient Have a Medical Advance Directive? Yes  Type of Advance Directive Living will  Does patient want to make changes to medical advance directive? No - Patient declined  Copy of [place] in Chart? -  Would patient like information on creating a medical advance directive? -  Pre-existing out of facility DNR order (yellow form or pink MOST form) -      Chief Complaint  Patient presents with   New Admit To SNF    Admission to SNF    HPI: Patient is a 87 y.o. female seen today for admission to Snf For therapy and Long erm care  Was admitted in the hospital from 7/18-7/25 for sepsis secondary to UTI  Has h/o diabetes mellitus type 2 with neuropathy, history of alcoholic liver cirrhosis, Portal hypertensive gastropathy  s/p EGD Diastolic CHF with Recurent Pleural Effusion and Iron Def Anemia Anemia and thrombocytopenia due to cirrhosis and splenomegaly History of hypertension and hypothyroidism  She was sent to ED after a fall and was found to have a temperature of 102.8 With COVID-negative.  Chest x-ray was negative for pneumonia Urine culture was positive for Enterococcus was treated with ampicillin Patient also had a Streptococcus bacteremia.  Seen by ID.  Echo was negative for any vegetations.  ID did not recommend any further work-up  Patient is now in SNF Doing well Looks Stable Denies any Fever ro chills no SOB Walking with her walker Denies any Dizziness Past Medical History:  Diagnosis Date   Anemia    Anemia of chronic renal failure, stage 3 (moderate) ([place]) 03/24/2020   Angiodysplasia of ascending colon 10/25/2014   Anxiety    Arthritis    Borderline diabetes    Cancer of the skin, basal cell 09/03/2012   Cirrhosis ([place])    Diabetes mellitus without complication ([place])    Diverticular disease    GAVE (gastric antral vascular ectasia) 09/09/2017   GERD (gastroesophageal reflux disease)    Hypertension    Iron deficiency anemia due to chronic blood loss 01/19/2015   Portal hypertensive gastropathy ([place]) 08/02/2016   ? Some GAVE also   Thyroid disease    Past Surgical History:  Procedure Laterality Date   ABDOMINAL HYSTERECTOMY     APPENDECTOMY     COLONOSCOPY     ESOPHAGOGASTRODUODENOSCOPY     ESOPHAGOGASTRODUODENOSCOPY (EGD) WITH PROPOFOL N/A 02/22/2020   Procedure: ESOPHAGOGASTRODUODENOSCOPY (EGD) WITH PROPOFOL;  Surgeon: Gatha Mayer, MD;  Location: [facility];  Service: Endoscopy;  Laterality: N/A;   IR PARACENTESIS  05/25/2016  IR THORACENTESIS ASP PLEURAL SPACE W/IMG GUIDE  02/23/2020   LEG SKIN LESION  BIOPSY / EXCISION  12/11/14   MOHS SURGERY     ankle   TONSILLECTOMY     TOTAL HIP ARTHROPLASTY Bilateral 1993, 2006   UPPER GASTROINTESTINAL ENDOSCOPY  09/09/2017     reports that she has never smoked. She has never used smokeless tobacco. She reports current alcohol use. She reports that she does not use drugs. Social History   Socioeconomic History   Marital status: Divorced    Spouse name: Not on file   Number of children: 2   Years of education: Not on file   Highest education level: Not on file  Occupational History   Occupation: retired  Tobacco Use   Smoking status: Never   Smokeless tobacco: Never  Vaping Use   Vaping Use: Never used  Substance and Sexual Activity   Alcohol use: Yes    Comment: weekly   Drug use: No   Sexual activity: Not on file  Other Topics Concern   Not on file  Social History Narrative   The patient is divorced. She is originally from [place]. She has 2 sons. 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   08/10/2014   Social Determinants of Health   Financial Resource Strain: Not on file  Food Insecurity: Not on file  Transportation Needs: Not on file  Physical Activity: Not on file  Stress: Not on file  Social Connections: Not on file  Intimate Partner Violence: Not on file    Functional Status Survey:    Family History  Problem Relation Age of Onset   Bladder Cancer Father    Heart attack Father    Diabetes Mother    Colon cancer Neg Hx    Colon polyps Neg Hx    Esophageal cancer Neg Hx    Rectal cancer Neg Hx    Stomach cancer Neg Hx     Health Maintenance  Topic Date Due   Zoster Vaccines- Shingrix (1 of 2) Never done   FOOT EXAM  05/12/2015   URINE MICROALBUMIN  04/27/2020   OPHTHALMOLOGY EXAM  05/17/2020   INFLUENZA VACCINE  08/22/2020   COVID-19 Vaccine (5 - Booster) 10/21/2020   HEMOGLOBIN A1C  02/09/2021   TETANUS/TDAP  11/07/2026   DEXA SCAN  Completed   PNA vac Low Risk Adult  Addressed   HPV VACCINES  Aged Out   COLONOSCOPY (Pts 45-106yr Insurance coverage will need to be confirmed)  Discontinued    Allergies  Allergen Reactions   Tizanidine  Other (See Comments)    Hallucinate, confused     Allergies as of 08/16/2020       Reactions   Tizanidine Other (See Comments)   Hallucinate, confused         Medication List        Accurate as of August 16, 2020  9:27 AM. If you have any questions, ask your nurse or doctor.          Accu-Chek FastClix Lancets Misc CHECK FOR BLOOD SUGAR DAILY   acetaminophen 325 MG tablet Commonly known as: TYLENOL Take 650 mg by mouth every 6 (six) hours as needed.   folic acid 1 MG tablet Commonly known as: FOLVITE Take 1 tablet (1 mg total) by mouth daily.   glucose blood test strip 1 each by Other route 2 (two) times daily. Use as instructed-Check blood glucose prior to administering insulin   insulin  aspart 100 UNIT/ML injection Commonly known as: novoLOG Inject 10 Units into the skin 3 (three) times daily. CBG more then 100   insulin glargine 100 UNIT/ML injection Commonly known as: LANTUS Inject 0.22 mLs (22 Units total) into the skin every morning.   insulin glargine 100 UNIT/ML injection Commonly known as: LANTUS Inject 0.32 mLs (32 Units total) into the skin at bedtime.   lactulose 10 GM/15ML solution Commonly known as: CHRONULAC Take 20 g by mouth daily.   latanoprost 0.005 % ophthalmic solution Commonly known as: XALATAN Place 1 drop into both eyes at bedtime.   levothyroxine 75 MCG tablet Commonly known as: SYNTHROID Take 1 tablet (75 mcg total) by mouth daily.   LORazepam 0.5 MG tablet Commonly known as: ATIVAN Take 1 tablet (0.5 mg total) by mouth at bedtime.   metFORMIN 500 MG 24 hr tablet Commonly known as: GLUCOPHAGE-XR Take 1 tablet (500 mg total) by mouth daily.   metoprolol succinate 25 MG 24 hr tablet Commonly known as: TOPROL-XL Take 12.5 mg by mouth daily.   nystatin powder Commonly known as: MYCOSTATIN/NYSTOP Apply 1 application topically 2 (two) times daily.   pantoprazole 40 MG tablet Commonly known as: PROTONIX Take 1 tablet (40  mg total) by mouth daily.   polyethylene glycol 17 g packet Commonly known as: MIRALAX / GLYCOLAX Take 17 g by mouth 2 (two) times daily.   potassium chloride SA 20 MEQ tablet Commonly known as: KLOR-CON Take 1 tablet (20 mEq total) by mouth daily. Start taking on: August 22, 2020   senna-docusate 8.6-50 MG tablet Commonly known as: Senokot-S Take 1 tablet by mouth 2 (two) times daily.   simvastatin 20 MG tablet Commonly known as: ZOCOR TAKE ONE TABLET BY MOUTH EVERY NIGHT AT BEDTIME   sitaGLIPtin 50 MG tablet Commonly known as: JANUVIA Take 50 mg by mouth daily.   spironolactone 25 MG tablet Commonly known as: Aldactone Take 1 tablet (25 mg total) by mouth daily. Start taking on: August 22, 2020   torsemide 20 MG tablet Commonly known as: DEMADEX Take 1 tablet (20 mg total) by mouth daily. Start taking on: August 22, 2020   vitamin C 1000 MG tablet Take 1,000 mg by mouth at bedtime.   vitamin E 180 MG (400 UNITS) capsule Take 400 Units by mouth every evening.        Review of Systems Review of Systems  Constitutional: Negative for activity change, appetite change, chills, diaphoresis, fatigue and fever.  HENT: Negative for mouth sores, postnasal drip, rhinorrhea, sinus pain and sore throat.   Respiratory: Negative for apnea, cough, chest tightness, shortness of breath and wheezing.   Cardiovascular: Negative for chest pain, palpitations and leg swelling.  Gastrointestinal: Negative for abdominal distention, abdominal pain, constipation, diarrhea, nausea and vomiting.  Genitourinary: Negative for dysuria and frequency.  Musculoskeletal: Negative for arthralgias, joint swelling and myalgias.  Skin: Negative for rash.  Neurological: Negative for dizziness, syncope, weakness, light-headedness and numbness.  Psychiatric/Behavioral: Negative for behavioral problems, confusion and sleep disturbance.    Vitals:   08/16/20 0907  BP: (!) 148/90  Pulse: (!) 118  Resp:  20  Temp: 97.7 F (36.5 C)  SpO2: 94%  Weight: 185 lb (83.9 kg)  Height: '5\' 6"'$  (1.676 m)   Body mass index is 29.86 kg/m. Physical Exam Constitutional: Oriented to person, place, and time. Well-developed and well-nourished.  HENT:  Head: Normocephalic.  Mouth/Throat: Oropharynx is clear and moist.  Eyes: Pupils are equal, round, and reactive to  light.  Neck: Neck supple.  Cardiovascular: Tachycardia Present and normal heart sounds.  No murmur heard. Pulmonary/Chest: Effort normal and breath sounds normal. No respiratory distress. No wheezes. She has no rales.  Abdominal: Soft. Bowel sounds are normal. No distension. There is no tenderness. There is no rebound.  Musculoskeletal: Mild edema Bilateral Lymphadenopathy: none Neurological: Alert and oriented to person, place, and time.  Skin: Skin is warm and dry.  Psychiatric: Normal mood and affect. Behavior is normal. Thought content normal.   Labs reviewed: Basic Metabolic Panel: Recent Labs    08/11/20 0442 08/12/20 0424 08/13/20 0410 08/14/20 0535 08/15/20 0421  NA 134*   < > 133* 134* 134*  K 3.9   < > 3.8 3.6 3.8  CL 102   < > 100 105 103  CO2 24   < > '25 25 25  '$ GLUCOSE 231*   < > 247* 168* 186*  BUN 16   < > '13 14 15  '$ CREATININE 0.82   < > 0.82 0.83 0.97  CALCIUM 8.7*   < > 8.7* 8.5* 8.3*  MG 2.0  --  1.8 1.9  --    < > = values in this interval not displayed.   Liver Function Tests: Recent Labs    08/11/20 0442 08/12/20 0424 08/13/20 0410  AST 152* 143* 94*  ALT 42 50* 43  ALKPHOS 71 98 91  BILITOT 1.4* 1.6* 1.3*  PROT 5.2* 5.2* 5.3*  ALBUMIN 2.6* 2.6* 2.6*   Recent Labs    08/08/20 1748  LIPASE 50   Recent Labs    01/16/20 1213 01/17/20 1048 08/08/20 1750  AMMONIA 71* 53* 31   CBC: Recent Labs    08/09/20 0612 08/10/20 0413 08/12/20 0424 08/13/20 0410 08/14/20 0535  WBC 8.6   < > 3.2* 3.5* 4.4  NEUTROABS 7.8*  --  2.2 2.3  --   HGB 9.7*   < > 9.3* 9.7* 9.3*  HCT 29.6*   < > 28.7*  29.5* 29.4*  MCV 93.4   < > 92.3 92.2 93.9  PLT 52*   < > 49* 50* 56*   < > = values in this interval not displayed.   Cardiac Enzymes: Recent Labs    10/16/19 1958 10/18/19 0736  CKTOTAL 1,431* 208   BNP: Invalid input(s): POCBNP Lab Results  Component Value Date   HGBA1C 6.4 (H) 08/09/2020   Lab Results  Component Value Date   TSH 0.895 08/08/2020   Lab Results  Component Value Date   VITAMINB12 1,101 (H) 01/12/2020   Lab Results  Component Value Date   FOLATE 16.6 01/12/2020   Lab Results  Component Value Date   IRON 61 08/05/2020   TIBC 275 08/05/2020   FERRITIN 26 08/05/2020    Imaging and Procedures obtained prior to SNF admission: CT ABDOMEN PELVIS W CONTRAST  Result Date: 08/09/2020 CLINICAL DATA:  Abdominal pain EXAM: CT ABDOMEN AND PELVIS WITH CONTRAST TECHNIQUE: Multidetector CT imaging of the abdomen and pelvis was performed using the standard protocol following bolus administration of intravenous contrast. CONTRAST:  36m OMNIPAQUE IOHEXOL 350 MG/ML SOLN COMPARISON:  01/12/2020 FINDINGS: LOWER CHEST: Right pleural effusion with basilar atelectasis HEPATOBILIARY: Diffusely nodular hepatic contours with relative hypertrophy of the caudate and left hepatic lobe, consistent with hepatic cirrhosis. No focal liver lesion. No biliary dilatation. Distended gallbladder PANCREAS: 12 mm cystic focus in the distal pancreatic body, slightly larger than on 01/12/2020. SPLEEN: Spleen is enlarged, measuring 16.7 cm in craniocaudal dimension.  ADRENALS/URINARY TRACT: The adrenal glands are normal. No hydronephrosis, nephroureterolithiasis or solid renal mass. The urinary bladder is normal for degree of distention STOMACH/BOWEL: There is no hiatal hernia. Normal duodenal course and caliber. No small bowel dilatation or inflammation. Rectosigmoid diverticulosis without acute inflammation. Normal appendix. VASCULAR/LYMPHATIC: There is calcific atherosclerosis of the abdominal aorta.  No lymphadenopathy. Recanalized umbilical vein. REPRODUCTIVE: Status post hysterectomy. No adnexal mass. MUSCULOSKELETAL. Bilateral hip arthroplasties OTHER: None. IMPRESSION: 1. No acute abnormality of the abdomen or pelvis. 2. Hepatic cirrhosis with evidence of portal hypertension. 3. Right pleural effusion with basilar atelectasis. 4. 12 mm cystic focus in the distal pancreatic body. Recommend follow up pre and post contrast MRI/MRCP or pancreatic protocol CT in 2 years. This recommendation follows ACR consensus guidelines: Management of Incidental Pancreatic Cysts: A White Paper of the ACR Incidental Findings Committee. [place] [id number]. Aortic Atherosclerosis (ICD10-I70.0). Electronically Signed   By: Ulyses Jarred M.D.   On: 08/09/2020 03:50   DG Chest Portable 1 View  Result Date: 08/08/2020 CLINICAL DATA:  Rhonchi fever EXAM: PORTABLE CHEST 1 VIEW COMPARISON:  02/23/2020 FINDINGS: The heart size and mediastinal contours are within normal limits. Aortic atherosclerosis. Both lungs are clear. The visualized skeletal structures are unremarkable. IMPRESSION: No active disease. Electronically Signed   By: Donavan Foil M.D.   On: 08/08/2020 18:33    Assessment/Plan  Septicemia ([place]) Treated with Ampicillin in Hospital No More Work up per ID  Acute cystitis without hematuria Finished her antibiotics  Type 2 diabetes mellitus with hyperglycemia, with long-term current use of insulin (H[facility]) Insulin Dose is reduced in hospital Will Continue on this for now Also on Metformin and Januvia  Chronic diastolic (congestive) heart failure ([place]) Will restart her Diuretics Next week Dose was reduced in the hospital Will Follow her Weight and Symptoms Closely Tachycardia On Toprol Will increase if needed  Adult hypothyroidism TSH normal in 7/22  Anemia i and Thrombocytopenia Follows with Hematology On Iron Infusions  Alcoholic Cirrhosis with Ascites On Aldactone and  Lactulose Mental status at baseline Will stay in SNF now   Family/ staff Communication:   Labs/tests ordered: BMP and CBC  Total time spent in this patient care encounter was  45_  minutes; greater than 50% of the visit spent counseling patient and staff, reviewing records , Labs and coordinating care for problems addressed at this encounter.

## 2020-08-16 NOTE — Telephone Encounter (Signed)
[facility] has faxed over paper asking for refill on patient medication "Lorazepam 0.'5mg'$ ". [facility] 30 tablets. Patient last refill was 08/15/2020 with 5 tablets. Medication pend and sent to Mast Man, NP for approval.

## 2020-08-18 ENCOUNTER — Encounter: Payer: Self-pay | Admitting: Internal Medicine

## 2020-08-22 ENCOUNTER — Encounter: Payer: Self-pay | Admitting: Nurse Practitioner

## 2020-08-22 ENCOUNTER — Non-Acute Institutional Stay (SKILLED_NURSING_FACILITY): Payer: HMO | Admitting: Nurse Practitioner

## 2020-08-22 DIAGNOSIS — E039 Hypothyroidism, unspecified: Secondary | ICD-10-CM | POA: Diagnosis not present

## 2020-08-22 DIAGNOSIS — I5032 Chronic diastolic (congestive) heart failure: Secondary | ICD-10-CM | POA: Diagnosis not present

## 2020-08-22 DIAGNOSIS — E1165 Type 2 diabetes mellitus with hyperglycemia: Secondary | ICD-10-CM

## 2020-08-22 DIAGNOSIS — D500 Iron deficiency anemia secondary to blood loss (chronic): Secondary | ICD-10-CM

## 2020-08-22 DIAGNOSIS — E871 Hypo-osmolality and hyponatremia: Secondary | ICD-10-CM

## 2020-08-22 DIAGNOSIS — K219 Gastro-esophageal reflux disease without esophagitis: Secondary | ICD-10-CM

## 2020-08-22 DIAGNOSIS — I482 Chronic atrial fibrillation, unspecified: Secondary | ICD-10-CM | POA: Diagnosis not present

## 2020-08-22 DIAGNOSIS — Z794 Long term (current) use of insulin: Secondary | ICD-10-CM

## 2020-08-22 DIAGNOSIS — F419 Anxiety disorder, unspecified: Secondary | ICD-10-CM

## 2020-08-22 DIAGNOSIS — K7031 Alcoholic cirrhosis of liver with ascites: Secondary | ICD-10-CM

## 2020-08-22 DIAGNOSIS — K5901 Slow transit constipation: Secondary | ICD-10-CM

## 2020-08-22 NOTE — Assessment & Plan Note (Addendum)
eported the patient's swelling to bilat LE and R hand. Denied pain, the right hand swelling is minimal, admitted DOE, denied phlegm production. Restarted Spironolactone, Torsemide today. Will weight M W F, may consider increase Torsemide if wt gained 3-5 Ibs 2-3 days or week. Pending BMP

## 2020-08-22 NOTE — Assessment & Plan Note (Addendum)
f/u Hematology, on iron infusion. Hgb 9.3 08/14/20, pending CBC

## 2020-08-22 NOTE — Assessment & Plan Note (Signed)
T2DM, Hgb a1c 6.4 08/09/20, on Metformin, Januvia, insulin

## 2020-08-22 NOTE — Assessment & Plan Note (Signed)
Mild, Na 134 08/15/20

## 2020-08-22 NOTE — Assessment & Plan Note (Signed)
Stable, continue Lorazepam.

## 2020-08-22 NOTE — Assessment & Plan Note (Signed)
Tachycardia, chronic, takes Metoprolol, may consider increase Metoprolol if persists after restarted diuretics

## 2020-08-22 NOTE — Assessment & Plan Note (Signed)
Stable, continue Pantoprazole.  

## 2020-08-22 NOTE — Progress Notes (Signed)
Location:   SNF [facility] Room Number: [phone] Place of Service:  SNF (31) Provider: Lennie Odor Cyann Venti NP  Virgie Dad, MD  Patient Care Team: Virgie Dad, MD as PCP - General (Internal Medicine) Berniece Salines, DO as PCP - Cardiology (Cardiology) Sheryn Bison, MD as Referring Physician (Dermatology) Gatha Mayer, MD as Consulting Physician (Gastroenterology) Marin Olp Rudell Cobb, MD as Consulting Physician (Oncology) Janan Ridge, MD as Consulting Physician (Dermatology) Paralee Cancel, MD as Consulting Physician (Orthopedic Surgery) Jacelyn Pi, MD as Consulting Physician (Endocrinology) Day, Melvenia Beam, [facility] (Inactive) as Pharmacist (Pharmacist) Berniece Salines, DO as Consulting Physician (Cardiology)  Extended Emergency Contact Information Primary Emergency Contact: Jessica Dennis [place] of [place] Phone: [phone] Mobile Phone: [phone] Relation: Son Secondary Emergency Contact: Jessica Dennis, Jessica Dennis Mobile Phone: [phone] Relation: Relative Preferred language: English Interpreter needed? No  Code Status: DNR Goals of care: Advanced Directive information Advanced Directives 08/22/2020  Does Patient Have a Medical Advance Directive? Yes  Type of Paramedic of [place];Out of facility DNR (pink MOST or yellow form);Living will  Does patient want to make changes to medical advance directive? No - Patient declined  Copy of [place] in Chart? Yes - validated most recent copy scanned in chart (See row information)  Would patient like information on creating a medical advance directive? -  Pre-existing out of facility DNR order (yellow form or pink MOST form) Yellow form placed in chart (order not valid for inpatient use)     Chief Complaint  Patient presents with   Acute Visit    Patient complains of swelling in the right hand    HPI:  Pt is a 87 y.o. female seen today for an acute visit for reported the  patient's swelling to bilat LE and R hand. Denied pain, the right hand swelling is minimal, admitted DOE, denied phlegm production. Restarted Spironolactone, Torsemide today.     Urosepsis, treated with Ampicillin in hospital, no further workup per ID  T2DM, Hgb a1c 6.4 08/09/20, on Metformin, Januvia, insulin   Chronic diastolic CHF, on Torsemide  Tachycardia, chronic, takes Metoprolol  Hypothyroidism, TSH 0.895 08/08/20, on Levothyroxine  Anemia, f/u Hematology, on iron infusion. Hgb 9.3 [id number]  Alcoholic cirrhosis with ascites, on Spironolactone, Lactulose  Constipation, takes Senokot S, MiraLax.    GERD, takes Pantoprazole  Anxiety, takes Lorazepam  Hyponatremia, Na 134 08/15/20    Past Medical History:  Diagnosis Date   Anemia    Anemia of chronic renal failure, stage 3 (moderate) ([place]) 03/24/2020   Angiodysplasia of ascending colon 10/25/2014   Anxiety    Arthritis    Borderline diabetes    Cancer of the skin, basal cell 09/03/2012   Cirrhosis ([place])    Diabetes mellitus without complication ([place])    Diverticular disease    GAVE (gastric antral vascular ectasia) 09/09/2017   GERD (gastroesophageal reflux disease)    Hypertension    Iron deficiency anemia due to chronic blood loss 01/19/2015   Portal hypertensive gastropathy ([place]) 08/02/2016   ? Some GAVE also   Thyroid disease    Past Surgical History:  Procedure Laterality Date   ABDOMINAL HYSTERECTOMY     APPENDECTOMY     COLONOSCOPY     ESOPHAGOGASTRODUODENOSCOPY     ESOPHAGOGASTRODUODENOSCOPY (EGD) WITH PROPOFOL N/A 02/22/2020   Procedure: ESOPHAGOGASTRODUODENOSCOPY (EGD) WITH PROPOFOL;  Surgeon: Gatha Mayer, MD;  Location: [facility];  Service: Endoscopy;  Laterality: N/A;   IR PARACENTESIS  05/25/2016  IR THORACENTESIS ASP PLEURAL SPACE W/IMG GUIDE  02/23/2020   LEG SKIN LESION  BIOPSY / EXCISION  12/11/14   MOHS SURGERY     ankle   TONSILLECTOMY     TOTAL HIP ARTHROPLASTY Bilateral 1993, 2006   UPPER  GASTROINTESTINAL ENDOSCOPY  09/09/2017    Allergies  Allergen Reactions   Tizanidine Other (See Comments)    Hallucinate, confused     Allergies as of 08/22/2020       Reactions   Tizanidine Other (See Comments)   Hallucinate, confused         Medication List        Accurate as of August 22, 2020 11:59 PM. If you have any questions, ask your nurse or doctor.          Accu-Chek FastClix Lancets Misc CHECK FOR BLOOD SUGAR DAILY   acetaminophen 325 MG tablet Commonly known as: TYLENOL Take 650 mg by mouth every 6 (six) hours as needed.   folic acid 1 MG tablet Commonly known as: FOLVITE Take 1 tablet (1 mg total) by mouth daily.   glucose blood test strip 1 each by Other route 2 (two) times daily. Use as instructed-Check blood glucose prior to administering insulin   insulin aspart 100 UNIT/ML injection Commonly known as: novoLOG Inject 10 Units into the skin 3 (three) times daily. CBG more then 100   insulin glargine 100 UNIT/ML injection Commonly known as: LANTUS Inject 0.22 mLs (22 Units total) into the skin every morning.   insulin glargine 100 UNIT/ML injection Commonly known as: LANTUS Inject 0.32 mLs (32 Units total) into the skin at bedtime.   lactulose 10 GM/15ML solution Commonly known as: CHRONULAC Take 20 g by mouth daily.   latanoprost 0.005 % ophthalmic solution Commonly known as: XALATAN Place 1 drop into both eyes at bedtime.   levothyroxine 75 MCG tablet Commonly known as: SYNTHROID Take 1 tablet (75 mcg total) by mouth daily.   LORazepam 0.5 MG tablet Commonly known as: ATIVAN Take 1 tablet (0.5 mg total) by mouth at bedtime.   metFORMIN 500 MG 24 hr tablet Commonly known as: GLUCOPHAGE-XR Take 1 tablet (500 mg total) by mouth daily.   metoprolol succinate 25 MG 24 hr tablet Commonly known as: TOPROL-XL Take 12.5 mg by mouth daily.   nystatin powder Commonly known as: MYCOSTATIN/NYSTOP Apply 1 application topically 2 (two)  times daily.   pantoprazole 40 MG tablet Commonly known as: PROTONIX Take 1 tablet (40 mg total) by mouth daily.   polyethylene glycol 17 g packet Commonly known as: MIRALAX / GLYCOLAX Take 17 g by mouth 2 (two) times daily.   potassium chloride SA 20 MEQ tablet Commonly known as: KLOR-CON Take 1 tablet (20 mEq total) by mouth daily.   senna-docusate 8.6-50 MG tablet Commonly known as: Senokot-S Take 1 tablet by mouth 2 (two) times daily.   simvastatin 20 MG tablet Commonly known as: ZOCOR TAKE ONE TABLET BY MOUTH EVERY NIGHT AT BEDTIME   sitaGLIPtin 50 MG tablet Commonly known as: JANUVIA Take 50 mg by mouth daily.   spironolactone 25 MG tablet Commonly known as: Aldactone Take 1 tablet (25 mg total) by mouth daily.   torsemide 20 MG tablet Commonly known as: DEMADEX Take 1 tablet (20 mg total) by mouth daily.   vitamin C 1000 MG tablet Take 1,000 mg by mouth at bedtime.   vitamin E 180 MG (400 UNITS) capsule Take 400 Units by mouth every evening.  Review of Systems  Constitutional:  Positive for unexpected weight change. Negative for appetite change, fatigue and fever.       Weight gained #17Ibs in a week?  HENT:  Positive for hearing loss. Negative for congestion and trouble swallowing.   Eyes:  Negative for visual disturbance.  Respiratory:  Positive for shortness of breath. Negative for cough.        DOE  Cardiovascular:  Positive for leg swelling. Negative for chest pain and palpitations.  Gastrointestinal:  Negative for abdominal pain and constipation.  Genitourinary:  Negative for difficulty urinating, dysuria and urgency.  Musculoskeletal:  Positive for arthralgias. Negative for gait problem.  Skin:  Negative for color change and rash.  Neurological:  Negative for speech difficulty, weakness and light-headedness.  Psychiatric/Behavioral:  Negative for behavioral problems and sleep disturbance. The patient is not nervous/anxious.     Immunization History  Administered Date(s) Administered   Fluad Quad(high Dose 65+) 10/27/2018, 02/09/2020   Hepatitis B, adult 05/12/2014   Hepatitis B, ped/adol 08/10/2014   Influenza Split 11/02/2009, 10/15/2010   Influenza, High Dose Seasonal PF 10/13/2014, 01/26/2017   Influenza, Quadrivalent, Recombinant, Inj, Pf 09/30/2017   Influenza, Seasonal, Injecte, Preservative Fre 10/13/2014   Influenza,inj,quad, With Preservative 11/22/2016   Influenza-Unspecified 11/25/2013, 10/06/2015, 10/22/2017   Moderna Sars-Covid-2 Vaccination 03/26/2019, 04/23/2019, 06/21/2020   PFIZER(Purple Top)SARS-COV-2 Vaccination 11/21/2019   Pneumococcal Conjugate-13 05/17/2014   Pneumococcal Polysaccharide-23 10/06/2015   Tdap 10/02/2011, 11/06/2016   Zoster, Live 01/22/2005   Pertinent  Health Maintenance Due  Topic Date Due   FOOT EXAM  05/12/2015   URINE MICROALBUMIN  04/27/2020   OPHTHALMOLOGY EXAM  05/17/2020   INFLUENZA VACCINE  08/22/2020   HEMOGLOBIN A1C  02/09/2021   DEXA SCAN  Completed   PNA vac Low Risk Adult  Addressed   COLONOSCOPY (Pts 45-15yr Insurance coverage will need to be confirmed)  Discontinued   Fall Risk  04/28/2019 01/09/2018 10/26/2016 10/09/2016 11/25/2015  Falls in the past year? 1 0 Yes No Yes  Number falls in past yr: 0 - 2 or more - 2 or more  Injury with Fall? 0 - No - Yes  Comment - - - - -  Risk Factor Category  - - High Fall Risk - High Fall Risk  Risk for fall due to : - - Impaired balance/gait - -  Follow up Falls evaluation completed - - - Falls prevention discussed   Functional Status Survey:    Vitals:   08/22/20 1531  BP: 110/63  Pulse: 94  Resp: 18  Temp: (!) 97.2 F (36.2 C)  SpO2: 98%  Weight: 202 lb 12.8 oz (92 kg)  Height: '5\' 6"'$  (1.676 m)   Body mass index is 32.73 kg/m. Physical Exam Vitals and nursing note reviewed.  Constitutional:      Appearance: Normal appearance.  HENT:     Head: Normocephalic and atraumatic.      Mouth/Throat:     Mouth: Mucous membranes are moist.  Eyes:     Extraocular Movements: Extraocular movements intact.     Conjunctiva/sclera: Conjunctivae normal.     Pupils: Pupils are equal, round, and reactive to light.  Cardiovascular:     Rate and Rhythm: Tachycardia present.     Heart sounds: No murmur heard. Pulmonary:     Effort: Pulmonary effort is normal.     Breath sounds: No wheezing, rhonchi or rales.     Comments: bibasilar Abdominal:     General: Bowel sounds are normal.  Palpations: Abdomen is soft.     Tenderness: There is no abdominal tenderness.  Musculoskeletal:     Cervical back: Normal range of motion and neck supple.     Right lower leg: Edema present.     Left lower leg: Edema present.     Comments: 3+ edema BLE. C/o lower back at night.   Skin:    General: Skin is warm and dry.     Findings: No rash.     Comments: Venous insufficiency skin changes BLE  Neurological:     General: No focal deficit present.     Mental Status: She is alert. Mental status is at baseline.     Gait: Gait abnormal.     Comments: Oriented to person, place.   Psychiatric:        Mood and Affect: Mood normal.        Behavior: Behavior normal.        Thought Content: Thought content normal.        Judgment: Judgment normal.    Labs reviewed: Recent Labs    08/11/20 0442 08/12/20 0424 08/13/20 0410 08/14/20 0535 08/15/20 0421  NA 134*   < > 133* 134* 134*  K 3.9   < > 3.8 3.6 3.8  CL 102   < > 100 105 103  CO2 24   < > '25 25 25  '$ GLUCOSE 231*   < > 247* 168* 186*  BUN 16   < > '13 14 15  '$ CREATININE 0.82   < > 0.82 0.83 0.97  CALCIUM 8.7*   < > 8.7* 8.5* 8.3*  MG 2.0  --  1.8 1.9  --    < > = values in this interval not displayed.   Recent Labs    08/11/20 0442 08/12/20 0424 08/13/20 0410  AST 152* 143* 94*  ALT 42 50* 43  ALKPHOS 71 98 91  BILITOT 1.4* 1.6* 1.3*  PROT 5.2* 5.2* 5.3*  ALBUMIN 2.6* 2.6* 2.6*   Recent Labs    08/09/20 0612 08/10/20 0413  08/12/20 0424 08/13/20 0410 08/14/20 0535  WBC 8.6   < > 3.2* 3.5* 4.4  NEUTROABS 7.8*  --  2.2 2.3  --   HGB 9.7*   < > 9.3* 9.7* 9.3*  HCT 29.6*   < > 28.7* 29.5* 29.4*  MCV 93.4   < > 92.3 92.2 93.9  PLT 52*   < > 49* 50* 56*   < > = values in this interval not displayed.   Lab Results  Component Value Date   TSH 0.895 08/08/2020   Lab Results  Component Value Date   HGBA1C 6.4 (H) 08/09/2020   Lab Results  Component Value Date   CHOL 112 11/09/2019   HDL 46 (L) 11/09/2019   LDLCALC 52 11/09/2019   TRIG 61 11/09/2019   CHOLHDL 2.4 11/09/2019    Significant Diagnostic Results in last 30 days:  CT ABDOMEN PELVIS W CONTRAST  Result Date: 08/09/2020 CLINICAL DATA:  Abdominal pain EXAM: CT ABDOMEN AND PELVIS WITH CONTRAST TECHNIQUE: Multidetector CT imaging of the abdomen and pelvis was performed using the standard protocol following bolus administration of intravenous contrast. CONTRAST:  17m OMNIPAQUE IOHEXOL 350 MG/ML SOLN COMPARISON:  01/12/2020 FINDINGS: LOWER CHEST: Right pleural effusion with basilar atelectasis HEPATOBILIARY: Diffusely nodular hepatic contours with relative hypertrophy of the caudate and left hepatic lobe, consistent with hepatic cirrhosis. No focal liver lesion. No biliary dilatation. Distended gallbladder PANCREAS: 12 mm cystic focus in  the distal pancreatic body, slightly larger than on 01/12/2020. SPLEEN: Spleen is enlarged, measuring 16.7 cm in craniocaudal dimension. ADRENALS/URINARY TRACT: The adrenal glands are normal. No hydronephrosis, nephroureterolithiasis or solid renal mass. The urinary bladder is normal for degree of distention STOMACH/BOWEL: There is no hiatal hernia. Normal duodenal course and caliber. No small bowel dilatation or inflammation. Rectosigmoid diverticulosis without acute inflammation. Normal appendix. VASCULAR/LYMPHATIC: There is calcific atherosclerosis of the abdominal aorta. No lymphadenopathy. Recanalized umbilical vein.  REPRODUCTIVE: Status post hysterectomy. No adnexal mass. MUSCULOSKELETAL. Bilateral hip arthroplasties OTHER: None. IMPRESSION: 1. No acute abnormality of the abdomen or pelvis. 2. Hepatic cirrhosis with evidence of portal hypertension. 3. Right pleural effusion with basilar atelectasis. 4. 12 mm cystic focus in the distal pancreatic body. Recommend follow up pre and post contrast MRI/MRCP or pancreatic protocol CT in 2 years. This recommendation follows ACR consensus guidelines: Management of Incidental Pancreatic Cysts: A White Paper of the ACR Incidental Findings Committee. [place] [id number]. Aortic Atherosclerosis (ICD10-I70.0). Electronically Signed   By: Ulyses Jarred M.D.   On: 08/09/2020 03:50   DG Chest Portable 1 View  Result Date: 08/08/2020 CLINICAL DATA:  Rhonchi fever EXAM: PORTABLE CHEST 1 VIEW COMPARISON:  02/23/2020 FINDINGS: The heart size and mediastinal contours are within normal limits. Aortic atherosclerosis. Both lungs are clear. The visualized skeletal structures are unremarkable. IMPRESSION: No active disease. Electronically Signed   By: Donavan Foil M.D.   On: 08/08/2020 18:33   ECHOCARDIOGRAM COMPLETE  Result Date: 08/12/2020    ECHOCARDIOGRAM REPORT   Patient Name:   Jessica Dennis Date of Exam: 08/12/2020 Medical Rec #:  [id number]      Height:       66.0 in Accession #:    [id number]     Weight:       185.0 lb Date of Birth:  04/13/1932     BSA:          1.935 m Patient Age:    72 years       BP:           112/62 mmHg Patient Gender: F              HR:           112 bpm. Exam Location:  Inpatient Procedure: 2D Echo, Color Doppler and Cardiac Doppler Indications:    Bacteremia R78.81  History:        Patient has prior history of Echocardiogram examinations, most                 recent 11/27/2019. CHF; Risk Factors:Hypertension, Diabetes and                 Dyslipidemia.  Sonographer:    Raquel Sarna Senior RDCS Referring Phys: [place]  Sonographer Comments:  Technically difficult due to poor echo windows. IMPRESSIONS  1. Technically difficult; vegetation cannot be excluded with this study; if clinical suspicion high, suggest TEE to further assess.  2. Left ventricular ejection fraction, by estimation, is 60 to 65%. The left ventricle has normal function. The left ventricle has no regional wall motion abnormalities. There is mild left ventricular hypertrophy of the basal-septal segment. Left ventricular diastolic parameters are consistent with Grade I diastolic dysfunction (impaired relaxation).  3. Right ventricular systolic function is normal. The right ventricular size is normal.  4. The mitral valve is normal in structure. No evidence of mitral valve regurgitation. No evidence of mitral stenosis.  5. The aortic valve  has an indeterminant number of cusps. Aortic valve regurgitation is not visualized. No aortic stenosis is present.  6. The inferior vena cava is normal in size with greater than 50% respiratory variability, suggesting right atrial pressure of 3 mmHg. FINDINGS  Left Ventricle: Left ventricular ejection fraction, by estimation, is 60 to 65%. The left ventricle has normal function. The left ventricle has no regional wall motion abnormalities. The left ventricular internal cavity size was normal in size. There is  mild left ventricular hypertrophy of the basal-septal segment. Left ventricular diastolic parameters are consistent with Grade I diastolic dysfunction (impaired relaxation). Right Ventricle: The right ventricular size is normal. Right ventricular systolic function is normal. Left Atrium: Left atrial size was normal in size. Right Atrium: Right atrial size was normal in size. Pericardium: There is no evidence of pericardial effusion. Mitral Valve: The mitral valve is normal in structure. Mild mitral annular calcification. No evidence of mitral valve regurgitation. No evidence of mitral valve stenosis. Tricuspid Valve: The tricuspid valve is normal  in structure. Tricuspid valve regurgitation is not demonstrated. No evidence of tricuspid stenosis. Aortic Valve: The aortic valve has an indeterminant number of cusps. Aortic valve regurgitation is not visualized. No aortic stenosis is present. Pulmonic Valve: The pulmonic valve was not well visualized. Pulmonic valve regurgitation is not visualized. No evidence of pulmonic stenosis. Aorta: The aortic root is normal in size and structure. Venous: The inferior vena cava is normal in size with greater than 50% respiratory variability, suggesting right atrial pressure of 3 mmHg. IAS/Shunts: The interatrial septum was not well visualized. Additional Comments: Technically difficult; vegetation cannot be excluded with this study; if clinical suspicion high, suggest TEE to further assess.  LEFT VENTRICLE PLAX 2D LVIDd:         3.20 cm LVIDs:         1.90 cm LV PW:         1.00 cm LV IVS:        1.20 cm LVOT diam:     1.80 cm LV SV:         48 LV SV Index:   25 LVOT Area:     2.54 cm  RIGHT VENTRICLE RV S prime:     11.20 cm/s TAPSE (M-mode): 1.9 cm LEFT ATRIUM             Index LA diam:        3.00 cm 1.55 cm/m LA Vol (A2C):   54.6 ml 28.22 ml/m LA Vol (A4C):   38.0 ml 19.64 ml/m LA Biplane Vol: 47.2 ml 24.40 ml/m  AORTIC VALVE LVOT Vmax:   94.40 cm/s LVOT Vmean:  68.400 cm/s LVOT VTI:    0.190 m  AORTA Ao Root diam: 3.10 cm MITRAL VALVE MV Area (PHT): 2.96 cm    SHUNTS MV Decel Time: 256 msec    Systemic VTI:  0.19 m MV E velocity: 68.30 cm/s  Systemic Diam: 1.80 cm MV A velocity: 94.70 cm/s MV E/A ratio:  0.72 Kirk Ruths MD Electronically signed by Kirk Ruths MD Signature Date/Time: 08/12/2020/2:25:59 PM    Final     Assessment/Plan: Chronic diastolic (congestive) heart failure (HCC) eported the patient's swelling to bilat LE and R hand. Denied pain, the right hand swelling is minimal, admitted DOE, denied phlegm production. Restarted Spironolactone, Torsemide today. Will weight M W F, may consider  increase Torsemide if wt gained 3-5 Ibs 2-3 days or week. Pending BMP  Type 2 diabetes mellitus with hyperglycemia, with long-term  current use of insulin (HCC) T2DM, Hgb a1c 6.4 08/09/20, on Metformin, Januvia, insulin  Chronic atrial fibrillation (HCC) Tachycardia, chronic, takes Metoprolol, may consider increase Metoprolol if persists after restarted diuretics   Adult hypothyroidism TSH 0.895 08/08/20, on Levothyroxine  Anemia f/u Hematology, on iron infusion. Hgb 9.3 08/14/20, pending CBC  Alcoholic cirrhosis of liver with ascites ([place]) Alcoholic cirrhosis with ascites, on Spironolactone, Lactulose  Slow transit constipation Stable, continue Senokot S, MiraLax.   GERD (gastroesophageal reflux disease) Stable, continue Pantoprazole.   Anxiety Stable, continue Lorazepam.   Hyponatremia Mild, Na 134 08/15/20    Family/ staff Communication: plan of care reviewed with the patient and charge nurse.   Labs/tests ordered:  none  Time spend 35 minutes.

## 2020-08-22 NOTE — Assessment & Plan Note (Signed)
Alcoholic cirrhosis with ascites, on Spironolactone, Lactulose

## 2020-08-22 NOTE — Assessment & Plan Note (Signed)
TSH 0.895 08/08/20, on Levothyroxine

## 2020-08-22 NOTE — Assessment & Plan Note (Signed)
Stable, continue Senokot S, MiraLax 

## 2020-08-23 ENCOUNTER — Encounter: Payer: Self-pay | Admitting: Nurse Practitioner

## 2020-08-23 LAB — BASIC METABOLIC PANEL
BUN: 15 (ref 4–21)
CO2: 26 — AB (ref 13–22)
Chloride: 107 (ref 99–108)
Creatinine: 1.1 (ref 0.5–1.1)
Glucose: 167
Potassium: 3.1 — AB (ref 3.4–5.3)
Sodium: 140 (ref 137–147)

## 2020-08-23 LAB — CBC AND DIFFERENTIAL
HCT: 29 — AB (ref 36–46)
Hemoglobin: 9.2 — AB (ref 12.0–16.0)
Neutrophils Absolute: 2213
Platelets: 73 — AB (ref 150–399)
WBC: 3.4

## 2020-08-23 LAB — HEPATIC FUNCTION PANEL
ALT: 17 (ref 7–35)
AST: 28 (ref 13–35)
Alkaline Phosphatase: 99 (ref 25–125)
Bilirubin, Total: 1.3

## 2020-08-23 LAB — COMPREHENSIVE METABOLIC PANEL
Albumin: 2.6 — AB (ref 3.5–5.0)
Calcium: 8.2 — AB (ref 8.7–10.7)
Globulin: 1.9

## 2020-08-23 LAB — CBC: RBC: 3.19 — AB (ref 3.87–5.11)

## 2020-08-24 ENCOUNTER — Non-Acute Institutional Stay (SKILLED_NURSING_FACILITY): Payer: HMO | Admitting: Nurse Practitioner

## 2020-08-24 ENCOUNTER — Encounter: Payer: Self-pay | Admitting: Nurse Practitioner

## 2020-08-24 DIAGNOSIS — N1832 Chronic kidney disease, stage 3b: Secondary | ICD-10-CM | POA: Diagnosis not present

## 2020-08-24 DIAGNOSIS — K7031 Alcoholic cirrhosis of liver with ascites: Secondary | ICD-10-CM

## 2020-08-24 DIAGNOSIS — I5032 Chronic diastolic (congestive) heart failure: Secondary | ICD-10-CM

## 2020-08-24 DIAGNOSIS — D631 Anemia in chronic kidney disease: Secondary | ICD-10-CM

## 2020-08-24 DIAGNOSIS — K219 Gastro-esophageal reflux disease without esophagitis: Secondary | ICD-10-CM

## 2020-08-24 DIAGNOSIS — E1165 Type 2 diabetes mellitus with hyperglycemia: Secondary | ICD-10-CM

## 2020-08-24 DIAGNOSIS — K5901 Slow transit constipation: Secondary | ICD-10-CM

## 2020-08-24 DIAGNOSIS — I482 Chronic atrial fibrillation, unspecified: Secondary | ICD-10-CM | POA: Diagnosis not present

## 2020-08-24 DIAGNOSIS — Z794 Long term (current) use of insulin: Secondary | ICD-10-CM

## 2020-08-24 DIAGNOSIS — F419 Anxiety disorder, unspecified: Secondary | ICD-10-CM

## 2020-08-24 DIAGNOSIS — E039 Hypothyroidism, unspecified: Secondary | ICD-10-CM

## 2020-08-24 NOTE — Assessment & Plan Note (Signed)
persisted bilat LE, admitted DOE, denied phlegm production. Restarted Spironolactone, Torsemide 3 days ago 08/22/20, fluid seen on the top of feet.              Chronic diastolic CHF  02/26/91 Na 241, K 3.1, Bun 15, creat 1.09, eGFR 50  Will increase Torsemide 4m q8am and q2pm, increase Kcl 436m bid, f/u CMP/eGFR one week.

## 2020-08-24 NOTE — Assessment & Plan Note (Signed)
Stable, continue Lorazepam

## 2020-08-24 NOTE — Assessment & Plan Note (Signed)
takes Pantoprazole 

## 2020-08-24 NOTE — Assessment & Plan Note (Signed)
f/u Hematology, on iron infusion. Hgb 9.2 08/23/20

## 2020-08-24 NOTE — Assessment & Plan Note (Signed)
TSH 0.895 08/08/20, on Levothyroxine

## 2020-08-24 NOTE — Assessment & Plan Note (Signed)
Alcoholic cirrhosis with ascites, on Spironolactone, Lactulose

## 2020-08-24 NOTE — Progress Notes (Signed)
Location:   [facility] Room Number: [phone] Place of Service:  SNF (31) Provider: Lennie Odor [employer] NP  Virgie Dad, MD  Patient Care Team: Virgie Dad, MD as PCP - General (Internal Medicine) Berniece Salines, DO as PCP - Cardiology (Cardiology) Sheryn Bison, MD as Referring Physician (Dermatology) Gatha Mayer, MD as Consulting Physician (Gastroenterology) Marin Olp Rudell Cobb, MD as Consulting Physician (Oncology) Janan Ridge, MD as Consulting Physician (Dermatology) Paralee Cancel, MD as Consulting Physician (Orthopedic Surgery) Jacelyn Pi, MD as Consulting Physician (Endocrinology) Day, Melvenia Beam, [facility] (Inactive) as Pharmacist (Pharmacist) Berniece Salines, DO as Consulting Physician (Cardiology)  Extended Emergency Contact Information Primary Emergency Contact: Trinidad Curet [place] Phone: [phone] Mobile Phone: [phone] Relation: Son Secondary Emergency Contact: Tonni, Mansour Mobile Phone: [phone] Relation: Relative Preferred language: English Interpreter needed? No  Code Status: DNR Goals of care: Advanced Directive information Advanced Directives 08/24/2020  Does Patient Have a Medical Advance Directive? Yes  Type of Paramedic of [place];Living will;Out of facility DNR (pink MOST or yellow form)  Does patient want to make changes to medical advance directive? No - Patient declined  Copy of [place] in Chart? Yes - validated most recent copy scanned in chart (See row information)  Would patient like information on creating a medical advance directive? -  Pre-existing out of facility DNR order (yellow form or pink MOST form) Yellow form placed in chart (order not valid for inpatient use)     Chief Complaint  Patient presents with   Acute Visit    Patient presents for low potassium and leg swelling.      HPI:  Pt is a 87 y.o. female seen today for an acute visit for persisted  bilat LE, admitted DOE, denied phlegm production. Restarted Spironolactone, Torsemide 3 days ago 08/22/20, fluid seen on the top of feet.              Chronic diastolic CHF, on Torsemide, 08/23/20 Na 140, K 3.1, Bun 15, creat 1.09, eGFR 50             T2DM, Hgb a1c 6.4 08/09/20, on Metformin, Januvia, insulin              Tachycardia, chronic, takes Metoprolol             Hypothyroidism, TSH 0.895 08/08/20, on Levothyroxine             Anemia, f/u Hematology, on iron infusion. Hgb 9.2 08/23/20             Alcoholic cirrhosis with ascites, on Spironolactone, Lactulose             Constipation, takes Senokot S, MiraLax.                    GERD, takes Pantoprazole             Anxiety, takes Lorazepam             Hyponatremia, Na 140 08/23/20 Past Medical History:  Diagnosis Date   Anemia    Anemia of chronic renal failure, stage 3 (moderate) ([place]) 03/24/2020   Angiodysplasia of ascending colon 10/25/2014   Anxiety    Arthritis    Borderline diabetes    Cancer of the skin, basal cell 09/03/2012   Cirrhosis ([place])    Diabetes mellitus without complication ([place])    Diverticular disease    GAVE (gastric antral vascular ectasia) 09/09/2017   GERD (  gastroesophageal reflux disease)    Hypertension    Iron deficiency anemia due to chronic blood loss 01/19/2015   Portal hypertensive gastropathy ([place]) 08/02/2016   ? Some GAVE also   Thyroid disease    Past Surgical History:  Procedure Laterality Date   ABDOMINAL HYSTERECTOMY     APPENDECTOMY     COLONOSCOPY     ESOPHAGOGASTRODUODENOSCOPY     ESOPHAGOGASTRODUODENOSCOPY (EGD) WITH PROPOFOL N/A 02/22/2020   Procedure: ESOPHAGOGASTRODUODENOSCOPY (EGD) WITH PROPOFOL;  Surgeon: Gatha Mayer, MD;  Location: [facility];  Service: Endoscopy;  Laterality: N/A;   IR PARACENTESIS  05/25/2016   IR THORACENTESIS ASP PLEURAL SPACE W/IMG GUIDE  02/23/2020   LEG SKIN LESION  BIOPSY / EXCISION  12/11/14   MOHS SURGERY     ankle   TONSILLECTOMY     TOTAL HIP  ARTHROPLASTY Bilateral 1993, 2006   UPPER GASTROINTESTINAL ENDOSCOPY  09/09/2017    Allergies  Allergen Reactions   Tizanidine Other (See Comments)    Hallucinate, confused     Allergies as of 08/24/2020       Reactions   Tizanidine Other (See Comments)   Hallucinate, confused         Medication List        Accurate as of August 24, 2020 11:59 PM. If you have any questions, ask your nurse or doctor.          Accu-Chek FastClix Lancets Misc CHECK FOR BLOOD SUGAR DAILY   acetaminophen 325 MG tablet Commonly known as: TYLENOL Take 650 mg by mouth every 6 (six) hours as needed.   folic acid 1 MG tablet Commonly known as: FOLVITE Take 1 tablet (1 mg total) by mouth daily.   glucose blood test strip 1 each by Other route 2 (two) times daily. Use as instructed-Check blood glucose prior to administering insulin   insulin aspart 100 UNIT/ML injection Commonly known as: novoLOG Inject 10 Units into the skin 3 (three) times daily. CBG more then 100   insulin glargine 100 UNIT/ML injection Commonly known as: LANTUS Inject 0.22 mLs (22 Units total) into the skin every morning.   insulin glargine 100 UNIT/ML injection Commonly known as: LANTUS Inject 0.32 mLs (32 Units total) into the skin at bedtime.   lactulose 10 GM/15ML solution Commonly known as: CHRONULAC Take 20 g by mouth daily.   latanoprost 0.005 % ophthalmic solution Commonly known as: XALATAN Place 1 drop into both eyes at bedtime.   levothyroxine 75 MCG tablet Commonly known as: SYNTHROID Take 1 tablet (75 mcg total) by mouth daily.   LORazepam 0.5 MG tablet Commonly known as: ATIVAN Take 1 tablet (0.5 mg total) by mouth at bedtime.   metFORMIN 500 MG 24 hr tablet Commonly known as: GLUCOPHAGE-XR Take 1 tablet (500 mg total) by mouth daily.   metoprolol succinate 25 MG 24 hr tablet Commonly known as: TOPROL-XL Take 12.5 mg by mouth daily.   nystatin powder Commonly known as:  MYCOSTATIN/NYSTOP Apply 1 application topically 2 (two) times daily.   pantoprazole 40 MG tablet Commonly known as: PROTONIX Take 1 tablet (40 mg total) by mouth daily.   polyethylene glycol 17 g packet Commonly known as: MIRALAX / GLYCOLAX Take 17 g by mouth 2 (two) times daily.   potassium chloride SA 20 MEQ tablet Commonly known as: KLOR-CON Take 1 tablet (20 mEq total) by mouth daily.   senna-docusate 8.6-50 MG tablet Commonly known as: Senokot-S Take 1 tablet by mouth 2 (two) times daily.  simvastatin 20 MG tablet Commonly known as: ZOCOR TAKE ONE TABLET BY MOUTH EVERY NIGHT AT BEDTIME   sitaGLIPtin 50 MG tablet Commonly known as: JANUVIA Take 50 mg by mouth daily.   spironolactone 25 MG tablet Commonly known as: Aldactone Take 1 tablet (25 mg total) by mouth daily.   torsemide 20 MG tablet Commonly known as: DEMADEX Take 1 tablet (20 mg total) by mouth daily.   vitamin C 1000 MG tablet Take 1,000 mg by mouth at bedtime.   vitamin E 180 MG (400 UNITS) capsule Take 400 Units by mouth every evening.        Review of Systems  Constitutional:  Positive for unexpected weight change. Negative for activity change, appetite change and fever.       Weight gained about #4Ibs in the past week.   HENT:  Positive for hearing loss. Negative for congestion and trouble swallowing.   Eyes:  Negative for visual disturbance.  Respiratory:  Positive for shortness of breath. Negative for cough.        DOE  Cardiovascular:  Positive for leg swelling. Negative for chest pain and palpitations.  Gastrointestinal:  Negative for abdominal pain and constipation.  Genitourinary:  Negative for difficulty urinating, dysuria and urgency.  Musculoskeletal:  Positive for arthralgias. Negative for gait problem.  Skin:  Negative for color change.  Neurological:  Negative for speech difficulty, weakness and light-headedness.  Psychiatric/Behavioral:  Negative for behavioral problems and  sleep disturbance. The patient is not nervous/anxious.    Immunization History  Administered Date(s) Administered   Fluad Quad(high Dose 65+) 10/27/2018, 02/09/2020   Hepatitis B, adult 05/12/2014   Hepatitis B, ped/adol 08/10/2014   Influenza Split 11/02/2009, 10/15/2010   Influenza, High Dose Seasonal PF 10/13/2014, 01/26/2017   Influenza, Quadrivalent, Recombinant, Inj, Pf 09/30/2017   Influenza, Seasonal, Injecte, Preservative Fre 10/13/2014   Influenza,inj,quad, With Preservative 11/22/2016   Influenza-Unspecified 11/25/2013, 10/06/2015, 10/22/2017   Moderna Sars-Covid-2 Vaccination 03/26/2019, 04/23/2019, 06/21/2020   PFIZER(Purple Top)SARS-COV-2 Vaccination 11/21/2019   Pneumococcal Conjugate-13 05/17/2014   Pneumococcal Polysaccharide-23 10/06/2015   Tdap 10/02/2011, 11/06/2016   Zoster, Live 01/22/2005   Pertinent  Health Maintenance Due  Topic Date Due   FOOT EXAM  05/12/2015   URINE MICROALBUMIN  04/27/2020   OPHTHALMOLOGY EXAM  05/17/2020   INFLUENZA VACCINE  08/22/2020   HEMOGLOBIN A1C  02/09/2021   DEXA SCAN  Completed   PNA vac Low Risk Adult  Addressed   COLONOSCOPY (Pts 45-73yr Insurance coverage will need to be confirmed)  Discontinued   Fall Risk  04/28/2019 01/09/2018 10/26/2016 10/09/2016 11/25/2015  Falls in the past year? 1 0 Yes No Yes  Number falls in past yr: 0 - 2 or more - 2 or more  Injury with Fall? 0 - No - Yes  Comment - - - - -  Risk Factor Category  - - High Fall Risk - High Fall Risk  Risk for fall due to : - - Impaired balance/gait - -  Follow up Falls evaluation completed - - - Falls prevention discussed   Functional Status Survey:    Vitals:   08/24/20 1350  BP: 120/70  Pulse: (!) 115  Resp: 17  Temp: (!) 97.3 F (36.3 C)  SpO2: 96%  Weight: 200 lb 1.6 oz (90.8 kg)  Height: 5' 6"  (1.676 m)   Body mass index is 32.3 kg/m. Physical Exam Vitals and nursing note reviewed.  Constitutional:      Appearance: Normal appearance.   HENT:  Head: Normocephalic and atraumatic.     Mouth/Throat:     Mouth: Mucous membranes are moist.  Eyes:     Extraocular Movements: Extraocular movements intact.     Conjunctiva/sclera: Conjunctivae normal.     Pupils: Pupils are equal, round, and reactive to light.  Cardiovascular:     Rate and Rhythm: Tachycardia present.     Heart sounds: No murmur heard. Pulmonary:     Effort: Pulmonary effort is normal.     Breath sounds: No wheezing, rhonchi or rales.     Comments: bibasilar Abdominal:     General: Bowel sounds are normal.     Palpations: Abdomen is soft.     Tenderness: There is no abdominal tenderness.  Musculoskeletal:     Cervical back: Normal range of motion and neck supple.     Right lower leg: Edema present.     Left lower leg: Edema present.     Comments: 3+ edema BLE, fluid seen on the top of R+L foot.   Skin:    General: Skin is warm and dry.     Findings: No rash.     Comments: Venous insufficiency skin changes BLE  Neurological:     General: No focal deficit present.     Mental Status: She is alert. Mental status is at baseline.     Gait: Gait abnormal.     Comments: Oriented to person, place.   Psychiatric:        Mood and Affect: Mood normal.        Behavior: Behavior normal.        Thought Content: Thought content normal.        Judgment: Judgment normal.    Labs reviewed: Recent Labs    08/11/20 0442 08/12/20 0424 08/13/20 0410 08/14/20 0535 08/15/20 0421  NA 134*   < > 133* 134* 134*  K 3.9   < > 3.8 3.6 3.8  CL 102   < > 100 105 103  CO2 24   < > 25 25 25   GLUCOSE 231*   < > 247* 168* 186*  BUN 16   < > 13 14 15   CREATININE 0.82   < > 0.82 0.83 0.97  CALCIUM 8.7*   < > 8.7* 8.5* 8.3*  MG 2.0  --  1.8 1.9  --    < > = values in this interval not displayed.   Recent Labs    08/11/20 0442 08/12/20 0424 08/13/20 0410  AST 152* 143* 94*  ALT 42 50* 43  ALKPHOS 71 98 91  BILITOT 1.4* 1.6* 1.3*  PROT 5.2* 5.2* 5.3*  ALBUMIN  2.6* 2.6* 2.6*   Recent Labs    08/09/20 0612 08/10/20 0413 08/12/20 0424 08/13/20 0410 08/14/20 0535  WBC 8.6   < > 3.2* 3.5* 4.4  NEUTROABS 7.8*  --  2.2 2.3  --   HGB 9.7*   < > 9.3* 9.7* 9.3*  HCT 29.6*   < > 28.7* 29.5* 29.4*  MCV 93.4   < > 92.3 92.2 93.9  PLT 52*   < > 49* 50* 56*   < > = values in this interval not displayed.   Lab Results  Component Value Date   TSH 0.895 08/08/2020   Lab Results  Component Value Date   HGBA1C 6.4 (H) 08/09/2020   Lab Results  Component Value Date   CHOL 112 11/09/2019   HDL 46 (L) 11/09/2019   LDLCALC 52 11/09/2019   TRIG 61  11/09/2019   CHOLHDL 2.4 11/09/2019    Significant Diagnostic Results in last 30 days:  CT ABDOMEN PELVIS W CONTRAST  Result Date: 08/09/2020 CLINICAL DATA:  Abdominal pain EXAM: CT ABDOMEN AND PELVIS WITH CONTRAST TECHNIQUE: Multidetector CT imaging of the abdomen and pelvis was performed using the standard protocol following bolus administration of intravenous contrast. CONTRAST:  62m OMNIPAQUE IOHEXOL 350 MG/ML SOLN COMPARISON:  01/12/2020 FINDINGS: LOWER CHEST: Right pleural effusion with basilar atelectasis HEPATOBILIARY: Diffusely nodular hepatic contours with relative hypertrophy of the caudate and left hepatic lobe, consistent with hepatic cirrhosis. No focal liver lesion. No biliary dilatation. Distended gallbladder PANCREAS: 12 mm cystic focus in the distal pancreatic body, slightly larger than on 01/12/2020. SPLEEN: Spleen is enlarged, measuring 16.7 cm in craniocaudal dimension. ADRENALS/URINARY TRACT: The adrenal glands are normal. No hydronephrosis, nephroureterolithiasis or solid renal mass. The urinary bladder is normal for degree of distention STOMACH/BOWEL: There is no hiatal hernia. Normal duodenal course and caliber. No small bowel dilatation or inflammation. Rectosigmoid diverticulosis without acute inflammation. Normal appendix. VASCULAR/LYMPHATIC: There is calcific atherosclerosis of the  abdominal aorta. No lymphadenopathy. Recanalized umbilical vein. REPRODUCTIVE: Status post hysterectomy. No adnexal mass. MUSCULOSKELETAL. Bilateral hip arthroplasties OTHER: None. IMPRESSION: 1. No acute abnormality of the abdomen or pelvis. 2. Hepatic cirrhosis with evidence of portal hypertension. 3. Right pleural effusion with basilar atelectasis. 4. 12 mm cystic focus in the distal pancreatic body. Recommend follow up pre and post contrast MRI/MRCP or pancreatic protocol CT in 2 years. This recommendation follows ACR consensus guidelines: Management of Incidental Pancreatic Cysts: A White Paper of the ACR Incidental Findings Committee. [id number] Aortic Atherosclerosis (ICD10-I70.0). Electronically Signed   By: KUlyses JarredM.D.   On: 08/09/2020 03:50   DG Chest Portable 1 View  Result Date: 08/08/2020 CLINICAL DATA:  Rhonchi fever EXAM: PORTABLE CHEST 1 VIEW COMPARISON:  02/23/2020 FINDINGS: The heart size and mediastinal contours are within normal limits. Aortic atherosclerosis. Both lungs are clear. The visualized skeletal structures are unremarkable. IMPRESSION: No active disease. Electronically Signed   By: KDonavan FoilM.D.   On: 08/08/2020 18:33   ECHOCARDIOGRAM COMPLETE  Result Date: 08/12/2020    ECHOCARDIOGRAM REPORT   Patient Name:   Jessica MCANDREWDate of Exam: 08/12/2020 Medical Rec #:  [id number]     Height:       66.0 in Accession #:    [id number]    Weight:       185.0 lb Date of Birth:  1June 03, 1934    BSA:          1.935 m Patient Age:    89years       BP:           112/62 mmHg Patient Gender: F              HR:           112 bpm. Exam Location:  Inpatient Procedure: 2D Echo, Color Doppler and Cardiac Doppler Indications:    Bacteremia R78.81  History:        Patient has prior history of Echocardiogram examinations, most                 recent 11/27/2019. CHF; Risk Factors:Hypertension, Diabetes and                 Dyslipidemia.  Sonographer:    ERaquel SarnaSenior  RDCS Referring Phys: 3[place] Sonographer Comments: Technically difficult due to  poor echo windows. IMPRESSIONS  1. Technically difficult; vegetation cannot be excluded with this study; if clinical suspicion high, suggest TEE to further assess.  2. Left ventricular ejection fraction, by estimation, is 60 to 65%. The left ventricle has normal function. The left ventricle has no regional wall motion abnormalities. There is mild left ventricular hypertrophy of the basal-septal segment. Left ventricular diastolic parameters are consistent with Grade I diastolic dysfunction (impaired relaxation).  3. Right ventricular systolic function is normal. The right ventricular size is normal.  4. The mitral valve is normal in structure. No evidence of mitral valve regurgitation. No evidence of mitral stenosis.  5. The aortic valve has an indeterminant number of cusps. Aortic valve regurgitation is not visualized. No aortic stenosis is present.  6. The inferior vena cava is normal in size with greater than 50% respiratory variability, suggesting right atrial pressure of 3 mmHg. FINDINGS  Left Ventricle: Left ventricular ejection fraction, by estimation, is 60 to 65%. The left ventricle has normal function. The left ventricle has no regional wall motion abnormalities. The left ventricular internal cavity size was normal in size. There is  mild left ventricular hypertrophy of the basal-septal segment. Left ventricular diastolic parameters are consistent with Grade I diastolic dysfunction (impaired relaxation). Right Ventricle: The right ventricular size is normal. Right ventricular systolic function is normal. Left Atrium: Left atrial size was normal in size. Right Atrium: Right atrial size was normal in size. Pericardium: There is no evidence of pericardial effusion. Mitral Valve: The mitral valve is normal in structure. Mild mitral annular calcification. No evidence of mitral valve regurgitation. No evidence of  mitral valve stenosis. Tricuspid Valve: The tricuspid valve is normal in structure. Tricuspid valve regurgitation is not demonstrated. No evidence of tricuspid stenosis. Aortic Valve: The aortic valve has an indeterminant number of cusps. Aortic valve regurgitation is not visualized. No aortic stenosis is present. Pulmonic Valve: The pulmonic valve was not well visualized. Pulmonic valve regurgitation is not visualized. No evidence of pulmonic stenosis. Aorta: The aortic root is normal in size and structure. Venous: The inferior vena cava is normal in size with greater than 50% respiratory variability, suggesting right atrial pressure of 3 mmHg. IAS/Shunts: The interatrial septum was not well visualized. Additional Comments: Technically difficult; vegetation cannot be excluded with this study; if clinical suspicion high, suggest TEE to further assess.  LEFT VENTRICLE PLAX 2D LVIDd:         3.20 cm LVIDs:         1.90 cm LV PW:         1.00 cm LV IVS:        1.20 cm LVOT diam:     1.80 cm LV SV:         48 LV SV Index:   25 LVOT Area:     2.54 cm  RIGHT VENTRICLE RV S prime:     11.20 cm/s TAPSE (M-mode): 1.9 cm LEFT ATRIUM             Index LA diam:        3.00 cm 1.55 cm/m LA Vol (A2C):   54.6 ml 28.22 ml/m LA Vol (A4C):   38.0 ml 19.64 ml/m LA Biplane Vol: 47.2 ml 24.40 ml/m  AORTIC VALVE LVOT Vmax:   94.40 cm/s LVOT Vmean:  68.400 cm/s LVOT VTI:    0.190 m  AORTA Ao Root diam: 3.10 cm MITRAL VALVE MV Area (PHT): 2.96 cm    SHUNTS MV Decel Time: 256 msec  Systemic VTI:  0.19 m MV E velocity: 68.30 cm/s  Systemic Diam: 1.80 cm MV A velocity: 94.70 cm/s MV E/A ratio:  0.72 Kirk Ruths MD Electronically signed by Kirk Ruths MD Signature Date/Time: 08/12/2020/2:25:59 PM    Final     Assessment/Plan: Chronic diastolic (congestive) heart failure (HCC)  persisted bilat LE, admitted DOE, denied phlegm production. Restarted Spironolactone, Torsemide 3 days ago 08/22/20, fluid seen on the top of feet.               Chronic diastolic CHF  4/0/68 Na 403, K 3.1, Bun 15, creat 1.09, eGFR 50  Will increase Torsemide 43m q8am and q2pm, increase Kcl 451m bid, f/u CMP/eGFR one week.   Anemia in chronic kidney disease  f/u Hematology, on iron infusion. Hgb 9.2 08/23/20  Type 2 diabetes mellitus with hyperglycemia, with long-term current use of insulin (HCC) Hgb a1c 6.4 08/09/20, on Metformin, Januvia, insulin   Chronic atrial fibrillation (HCC) Tachycardia, chronic, may increase  Metoprolol if Bp allows after increase Torsemide.   Hypothyroidism TSH 0.895 08/08/20, on Levothyroxine  Alcoholic cirrhosis of liver with ascites (HCC) Alcoholic cirrhosis with ascites, on Spironolactone, Lactulose  Slow transit constipation , takes Senokot S, MiraLax.         GERD (gastroesophageal reflux disease) takes Pantoprazole  Anxiety Stable, continue Lorazepam    Family/ staff Communication: plan of care reviewed with the patient and charge nurse.   Labs/tests ordered:  CMP/eGFR 1 week

## 2020-08-24 NOTE — Assessment & Plan Note (Signed)
Hgb a1c 6.4 08/09/20, on Metformin, Januvia, insulin

## 2020-08-24 NOTE — Assessment & Plan Note (Signed)
Tachycardia, chronic, may increase  Metoprolol if Bp allows after increase Torsemide.

## 2020-08-24 NOTE — Assessment & Plan Note (Signed)
,   takes Senokot S, MiraLax. 

## 2020-08-25 ENCOUNTER — Encounter: Payer: Self-pay | Admitting: Nurse Practitioner

## 2020-08-30 LAB — COMPREHENSIVE METABOLIC PANEL
Albumin: 2.7 — AB (ref 3.5–5.0)
Calcium: 8.4 — AB (ref 8.7–10.7)
Globulin: 2.1

## 2020-08-30 LAB — HEPATIC FUNCTION PANEL
ALT: 14 (ref 7–35)
AST: 32 (ref 13–35)
Alkaline Phosphatase: 90 (ref 25–125)
Bilirubin, Total: 1.8

## 2020-08-30 LAB — BASIC METABOLIC PANEL
BUN: 18 (ref 4–21)
CO2: 30 — AB (ref 13–22)
Chloride: 103 (ref 99–108)
Creatinine: 1.2 — AB (ref 0.5–1.1)
Glucose: 58
Potassium: 3.3 — AB (ref 3.4–5.3)
Sodium: 141 (ref 137–147)

## 2020-09-06 LAB — BASIC METABOLIC PANEL
BUN: 16 (ref 4–21)
CO2: 31 — AB (ref 13–22)
Chloride: 103 (ref 99–108)
Creatinine: 1.2 — AB (ref 0.5–1.1)
Glucose: 64
Potassium: 3 — AB (ref 3.4–5.3)
Sodium: 143 (ref 137–147)

## 2020-09-06 LAB — HEPATIC FUNCTION PANEL
ALT: 15 (ref 7–35)
AST: 24 (ref 13–35)
Alkaline Phosphatase: 89 (ref 25–125)
Bilirubin, Total: 2.1

## 2020-09-06 LAB — COMPREHENSIVE METABOLIC PANEL
Albumin: 2.9 — AB (ref 3.5–5.0)
Calcium: 8.7 (ref 8.7–10.7)
Globulin: 2.3

## 2020-09-08 ENCOUNTER — Ambulatory Visit: Payer: HMO | Admitting: Cardiology

## 2020-09-09 LAB — BASIC METABOLIC PANEL
BUN: 18 (ref 4–21)
CO2: 24 — AB (ref 13–22)
Chloride: 103 (ref 99–108)
Creatinine: 1.3 — AB (ref 0.5–1.1)
Glucose: 213
Potassium: 3.5 (ref 3.4–5.3)
Sodium: 138 (ref 137–147)

## 2020-09-09 LAB — COMPREHENSIVE METABOLIC PANEL: Calcium: 8.5 — AB (ref 8.7–10.7)

## 2020-09-15 ENCOUNTER — Telehealth: Payer: Self-pay

## 2020-09-15 ENCOUNTER — Encounter: Payer: Self-pay | Admitting: Nurse Practitioner

## 2020-09-15 ENCOUNTER — Inpatient Hospital Stay (HOSPITAL_BASED_OUTPATIENT_CLINIC_OR_DEPARTMENT_OTHER): Payer: HMO | Admitting: Hematology & Oncology

## 2020-09-15 ENCOUNTER — Encounter: Payer: Self-pay | Admitting: Hematology & Oncology

## 2020-09-15 ENCOUNTER — Inpatient Hospital Stay: Payer: HMO | Attending: Hematology & Oncology

## 2020-09-15 ENCOUNTER — Non-Acute Institutional Stay (SKILLED_NURSING_FACILITY): Payer: HMO | Admitting: Nurse Practitioner

## 2020-09-15 ENCOUNTER — Other Ambulatory Visit: Payer: Self-pay

## 2020-09-15 VITALS — BP 110/62 | HR 119 | Temp 98.4°F | Resp 18 | Wt 176.0 lb

## 2020-09-15 DIAGNOSIS — D500 Iron deficiency anemia secondary to blood loss (chronic): Secondary | ICD-10-CM

## 2020-09-15 DIAGNOSIS — E871 Hypo-osmolality and hyponatremia: Secondary | ICD-10-CM

## 2020-09-15 DIAGNOSIS — D696 Thrombocytopenia, unspecified: Secondary | ICD-10-CM | POA: Diagnosis present

## 2020-09-15 DIAGNOSIS — D631 Anemia in chronic kidney disease: Secondary | ICD-10-CM

## 2020-09-15 DIAGNOSIS — K922 Gastrointestinal hemorrhage, unspecified: Secondary | ICD-10-CM | POA: Diagnosis not present

## 2020-09-15 DIAGNOSIS — Z794 Long term (current) use of insulin: Secondary | ICD-10-CM

## 2020-09-15 DIAGNOSIS — K7031 Alcoholic cirrhosis of liver with ascites: Secondary | ICD-10-CM

## 2020-09-15 DIAGNOSIS — E1165 Type 2 diabetes mellitus with hyperglycemia: Secondary | ICD-10-CM

## 2020-09-15 DIAGNOSIS — I482 Chronic atrial fibrillation, unspecified: Secondary | ICD-10-CM

## 2020-09-15 DIAGNOSIS — F419 Anxiety disorder, unspecified: Secondary | ICD-10-CM | POA: Diagnosis not present

## 2020-09-15 DIAGNOSIS — K5901 Slow transit constipation: Secondary | ICD-10-CM | POA: Diagnosis not present

## 2020-09-15 DIAGNOSIS — I5032 Chronic diastolic (congestive) heart failure: Secondary | ICD-10-CM

## 2020-09-15 DIAGNOSIS — K219 Gastro-esophageal reflux disease without esophagitis: Secondary | ICD-10-CM | POA: Diagnosis not present

## 2020-09-15 DIAGNOSIS — N1832 Chronic kidney disease, stage 3b: Secondary | ICD-10-CM

## 2020-09-15 DIAGNOSIS — K7460 Unspecified cirrhosis of liver: Secondary | ICD-10-CM | POA: Diagnosis not present

## 2020-09-15 DIAGNOSIS — E039 Hypothyroidism, unspecified: Secondary | ICD-10-CM

## 2020-09-15 DIAGNOSIS — I10 Essential (primary) hypertension: Secondary | ICD-10-CM

## 2020-09-15 DIAGNOSIS — D649 Anemia, unspecified: Secondary | ICD-10-CM

## 2020-09-15 LAB — CMP (CANCER CENTER ONLY)
ALT: 18 U/L (ref 0–44)
AST: 31 U/L (ref 15–41)
Albumin: 3.1 g/dL — ABNORMAL LOW (ref 3.5–5.0)
Alkaline Phosphatase: 92 U/L (ref 38–126)
Anion gap: 9 (ref 5–15)
BUN: 15 mg/dL (ref 8–23)
CO2: 27 mmol/L (ref 22–32)
Calcium: 9.2 mg/dL (ref 8.9–10.3)
Chloride: 100 mmol/L (ref 98–111)
Creatinine: 1.14 mg/dL — ABNORMAL HIGH (ref 0.44–1.00)
GFR, Estimated: 47 mL/min — ABNORMAL LOW (ref >60)
Glucose, Bld: 241 mg/dL — ABNORMAL HIGH (ref 70–99)
Potassium: 4.1 mmol/L (ref 3.5–5.1)
Sodium: 136 mmol/L (ref 135–145)
Total Bilirubin: 1.7 mg/dL — ABNORMAL HIGH (ref 0.3–1.2)
Total Protein: 5.8 g/dL — ABNORMAL LOW (ref 6.5–8.1)

## 2020-09-15 LAB — CBC WITH DIFFERENTIAL (CANCER CENTER ONLY)
Abs Immature Granulocytes: 0.03 10*3/uL (ref 0.00–0.07)
Basophils Absolute: 0 10*3/uL (ref 0.0–0.1)
Basophils Relative: 1 %
Eosinophils Absolute: 0.2 10*3/uL (ref 0.0–0.5)
Eosinophils Relative: 3 %
HCT: 32.3 % — ABNORMAL LOW (ref 36.0–46.0)
Hemoglobin: 10.1 g/dL — ABNORMAL LOW (ref 12.0–15.0)
Immature Granulocytes: 1 %
Lymphocytes Relative: 15 %
Lymphs Abs: 0.8 10*3/uL (ref 0.7–4.0)
MCH: 27.7 pg (ref 26.0–34.0)
MCHC: 31.3 g/dL (ref 30.0–36.0)
MCV: 88.7 fL (ref 80.0–100.0)
Monocytes Absolute: 0.5 10*3/uL (ref 0.1–1.0)
Monocytes Relative: 9 %
Neutro Abs: 3.8 10*3/uL (ref 1.7–7.7)
Neutrophils Relative %: 71 %
Platelet Count: 85 10*3/uL — ABNORMAL LOW (ref 150–400)
RBC: 3.64 MIL/uL — ABNORMAL LOW (ref 3.87–5.11)
RDW: 15.8 % — ABNORMAL HIGH (ref 11.5–15.5)
WBC Count: 5.4 10*3/uL (ref 4.0–10.5)
nRBC: 0 % (ref 0.0–0.2)

## 2020-09-15 LAB — RETICULOCYTES
Immature Retic Fract: 20.5 % — ABNORMAL HIGH (ref 2.3–15.9)
RBC.: 3.62 MIL/uL — ABNORMAL LOW (ref 3.87–5.11)
Retic Count, Absolute: 117.7 10*3/uL (ref 19.0–186.0)
Retic Ct Pct: 3.3 % — ABNORMAL HIGH (ref 0.4–3.1)

## 2020-09-15 LAB — IRON AND TIBC
Iron: 50 ug/dL (ref 41–142)
Saturation Ratios: 19 % — ABNORMAL LOW (ref 21–57)
TIBC: 266 ug/dL (ref 236–444)
UIBC: 216 ug/dL (ref 120–384)

## 2020-09-15 LAB — FERRITIN: Ferritin: 24 ng/mL (ref 11–307)

## 2020-09-15 LAB — SAMPLE TO BLOOD BANK

## 2020-09-15 NOTE — Assessment & Plan Note (Signed)
chronic, takes Metoprolol

## 2020-09-15 NOTE — Assessment & Plan Note (Signed)
Na 136 09/15/20

## 2020-09-15 NOTE — Assessment & Plan Note (Signed)
Stable, takes Senokot S, MiraLax. 

## 2020-09-15 NOTE — Progress Notes (Addendum)
Location:   [facility] Room Number: [place] of Service:  SNF [phone]) Provider:  Burnett Spray Otho Darner, NP  Virgie Dad, MD  Patient Care Team: Virgie Dad, MD as PCP - General (Internal Medicine) Berniece Salines, DO as PCP - Cardiology (Cardiology) Sheryn Bison, MD as Referring Physician (Dermatology) Gatha Mayer, MD as Consulting Physician (Gastroenterology) Marin Olp Rudell Cobb, MD as Consulting Physician (Oncology) Janan Ridge, MD as Consulting Physician (Dermatology) Paralee Cancel, MD as Consulting Physician (Orthopedic Surgery) Jacelyn Pi, MD as Consulting Physician (Endocrinology) Day, Melvenia Beam, [facility] (Inactive) as Pharmacist (Pharmacist) Berniece Salines, DO as Consulting Physician (Cardiology)  Extended Emergency Contact Information Primary Emergency Contact: Trinidad Curet [place] of [place] Phone: [phone] Mobile Phone: [phone] Relation: Son Secondary Emergency Contact: Sharvi, [place] Mobile Phone: [phone] Relation: Relative Preferred language: English Interpreter needed? No  Code Status:  DNR Goals of care: Advanced Directive information Advanced Directives 09/15/2020  Does Patient Have a Medical Advance Directive? Yes  Type of Paramedic of [place];Living will;Out of facility DNR (pink MOST or yellow form)  Does patient want to make changes to medical advance directive? No - Patient declined  Copy of [place] in Chart? Yes - validated most recent copy scanned in chart (See row information)  Would patient like information on creating a medical advance directive? -  Pre-existing out of facility DNR order (yellow form or pink MOST form) Yellow form placed in chart (order not valid for inpatient use)     Chief Complaint  Patient presents with   Medical Management of Chronic Issues    Routine follow up   Health Maintenance    Discuss need for shingles vaccine, foot exam, urine  microalbumin, ophthalmology exam, and influenza vaccine.     HPI:  Pt is a 87 y.o. female seen today for medical management of chronic diseases.      Bilat LE, better, on Spironolactone, Torsemide, Bun/creat 15/1.14 eGFR 47 09/15/20             Chronic diastolic CHF, on Torsemide, Spironolactone.              T2DM, Hgb a1c 6.4 08/09/20, on Metformin, Januvia, insulin              Tachycardia, chronic, takes Metoprolol             Hypothyroidism, TSH 0.895 08/08/20, on Levothyroxine             Anemia, f/u Hematology, on iron infusion. Hgb 10.1 09/15/20             Alcoholic cirrhosis with ascites, on Spironolactone, Lactulose             Constipation, takes Senokot S, MiraLax.                    GERD, takes Pantoprazole             Anxiety, takes Lorazepam             Hyponatremia, Na 136 09/15/20  Past Medical History:  Diagnosis Date   Anemia    Anemia of chronic renal failure, stage 3 (moderate) ([place]) 03/24/2020   Angiodysplasia of ascending colon 10/25/2014   Anxiety    Arthritis    Borderline diabetes    Cancer of the skin, basal cell 09/03/2012   Cirrhosis ([place])    Diabetes mellitus without complication ([place])    Diverticular disease    GAVE (  gastric antral vascular ectasia) 09/09/2017   GERD (gastroesophageal reflux disease)    Hypertension    Iron deficiency anemia due to chronic blood loss 01/19/2015   Portal hypertensive gastropathy ([place]) 08/02/2016   ? Some GAVE also   Thyroid disease    Past Surgical History:  Procedure Laterality Date   ABDOMINAL HYSTERECTOMY     APPENDECTOMY     COLONOSCOPY     ESOPHAGOGASTRODUODENOSCOPY     ESOPHAGOGASTRODUODENOSCOPY (EGD) WITH PROPOFOL N/A 02/22/2020   Procedure: ESOPHAGOGASTRODUODENOSCOPY (EGD) WITH PROPOFOL;  Surgeon: Gatha Mayer, MD;  Location: [facility];  Service: Endoscopy;  Laterality: N/A;   IR PARACENTESIS  05/25/2016   IR THORACENTESIS ASP PLEURAL SPACE W/IMG GUIDE  02/23/2020   LEG SKIN LESION  BIOPSY / EXCISION   12/11/14   MOHS SURGERY     ankle   TONSILLECTOMY     TOTAL HIP ARTHROPLASTY Bilateral 1993, 2006   UPPER GASTROINTESTINAL ENDOSCOPY  09/09/2017    Allergies  Allergen Reactions   Tizanidine Other (See Comments)    Hallucinate, confused     Allergies as of 09/15/2020       Reactions   Tizanidine Other (See Comments)   Hallucinate, confused         Medication List        Accurate as of September 15, 2020 11:59 PM. If you have any questions, ask your nurse or doctor.          STOP taking these medications    Accu-Chek FastClix Lancets Misc Stopped by: Ima Hafner X Aniyha Tate, NP   glucose blood test strip Stopped by: Colt Martelle X Nathaneal Sommers, NP       TAKE these medications    acetaminophen 325 MG tablet Commonly known as: TYLENOL Take 650 mg by mouth every 6 (six) hours as needed.   folic acid 1 MG tablet Commonly known as: FOLVITE Take 1 tablet (1 mg total) by mouth daily.   insulin aspart 100 UNIT/ML injection Commonly known as: novoLOG Inject 10 Units into the skin 3 (three) times daily. CBG more then 100   insulin glargine 100 UNIT/ML injection Commonly known as: LANTUS Inject 0.22 mLs (22 Units total) into the skin every morning.   insulin glargine 100 UNIT/ML injection Commonly known as: LANTUS Inject 0.32 mLs (32 Units total) into the skin at bedtime.   lactulose 10 GM/15ML solution Commonly known as: CHRONULAC Take 20 g by mouth daily.   latanoprost 0.005 % ophthalmic solution Commonly known as: XALATAN Place 1 drop into both eyes at bedtime.   levothyroxine 75 MCG tablet Commonly known as: SYNTHROID Take 1 tablet (75 mcg total) by mouth daily.   LORazepam 0.5 MG tablet Commonly known as: ATIVAN Take 1 tablet (0.5 mg total) by mouth at bedtime.   metFORMIN 500 MG 24 hr tablet Commonly known as: GLUCOPHAGE-XR Take 1 tablet (500 mg total) by mouth daily.   metoprolol succinate 25 MG 24 hr tablet Commonly known as: TOPROL-XL Take 12.5 mg by mouth daily.    nystatin powder Commonly known as: MYCOSTATIN/NYSTOP Apply 1 application topically 2 (two) times daily.   pantoprazole 40 MG tablet Commonly known as: PROTONIX Take 1 tablet (40 mg total) by mouth daily.   polyethylene glycol 17 g packet Commonly known as: MIRALAX / GLYCOLAX Take 17 g by mouth 2 (two) times daily.   potassium chloride SA 20 MEQ tablet Commonly known as: KLOR-CON Take 1 tablet (20 mEq total) by mouth daily.   senna-docusate 8.6-50 MG tablet Commonly  known as: Senokot-S Take 1 tablet by mouth 2 (two) times daily.   simvastatin 20 MG tablet Commonly known as: ZOCOR TAKE ONE TABLET BY MOUTH EVERY NIGHT AT BEDTIME   sitaGLIPtin 50 MG tablet Commonly known as: JANUVIA Take 50 mg by mouth daily.   spironolactone 25 MG tablet Commonly known as: Aldactone Take 1 tablet (25 mg total) by mouth daily.   torsemide 20 MG tablet Commonly known as: DEMADEX Take 1 tablet (20 mg total) by mouth daily.   vitamin C 1000 MG tablet Take 1,000 mg by mouth at bedtime.   vitamin E 180 MG (400 UNITS) capsule Take 400 Units by mouth every evening.        Review of Systems  Constitutional:  Negative for activity change, appetite change and fever.  HENT:  Positive for hearing loss. Negative for congestion and trouble swallowing.   Eyes:  Negative for visual disturbance.  Respiratory:  Positive for shortness of breath. Negative for cough.        DOE  Cardiovascular:  Positive for leg swelling. Negative for chest pain and palpitations.  Gastrointestinal:  Negative for abdominal pain and constipation.  Genitourinary:  Negative for difficulty urinating, dysuria and urgency.  Musculoskeletal:  Positive for arthralgias. Negative for gait problem.  Skin:  Negative for color change.  Neurological:  Negative for speech difficulty, weakness and light-headedness.  Psychiatric/Behavioral:  Negative for behavioral problems and sleep disturbance. The patient is not nervous/anxious.     Immunization History  Administered Date(s) Administered   Fluad Quad(high Dose 65+) 10/27/2018, 02/09/2020   Hepatitis B, adult 05/12/2014   Hepatitis B, ped/adol 08/10/2014   Influenza Split 11/02/2009, 10/15/2010   Influenza, High Dose Seasonal PF 10/13/2014, 01/26/2017   Influenza, Quadrivalent, Recombinant, Inj, Pf 09/30/2017   Influenza, Seasonal, Injecte, Preservative Fre 10/13/2014   Influenza,inj,quad, With Preservative 11/22/2016   Influenza-Unspecified 11/25/2013, 10/06/2015, 10/22/2017   Moderna Sars-Covid-2 Vaccination 03/26/2019, 04/23/2019, 06/21/2020   PFIZER(Purple Top)SARS-COV-2 Vaccination 11/21/2019   Pneumococcal Conjugate-13 05/17/2014   Pneumococcal Polysaccharide-23 10/06/2015   Tdap 10/02/2011, 11/06/2016   Zoster, Live 01/22/2005   Pertinent  Health Maintenance Due  Topic Date Due   FOOT EXAM  05/12/2015   URINE MICROALBUMIN  04/27/2020   OPHTHALMOLOGY EXAM  05/17/2020   INFLUENZA VACCINE  08/22/2020   HEMOGLOBIN A1C  02/09/2021   DEXA SCAN  Completed   PNA vac Low Risk Adult  Addressed   COLONOSCOPY (Pts 45-66yr Insurance coverage will need to be confirmed)  Discontinued   Fall Risk  04/28/2019 01/09/2018 10/26/2016 10/09/2016 11/25/2015  Falls in the past year? 1 0 Yes No Yes  Number falls in past yr: 0 - 2 or more - 2 or more  Injury with Fall? 0 - No - Yes  Comment - - - - -  Risk Factor Category  - - High Fall Risk - High Fall Risk  Risk for fall due to : - - Impaired balance/gait - -  Follow up Falls evaluation completed - - - Falls prevention discussed   Functional Status Survey:    Vitals:   09/15/20 0944  BP: (!) 105/50  Pulse: (!) 124  Resp: 17  Temp: (!) 97.4 F (36.3 C)  SpO2: 97%  Weight: 175 lb 8 oz (79.6 kg)  Height: 5' 6"  (1.676 m)   Body mass index is 28.33 kg/m. Physical Exam Vitals and nursing note reviewed.  Constitutional:      Appearance: Normal appearance.  HENT:     Head: Normocephalic and  atraumatic.      Mouth/Throat:     Mouth: Mucous membranes are moist.  Eyes:     Extraocular Movements: Extraocular movements intact.     Conjunctiva/sclera: Conjunctivae normal.     Pupils: Pupils are equal, round, and reactive to light.  Cardiovascular:     Rate and Rhythm: Tachycardia present.     Heart sounds: No murmur heard. Pulmonary:     Effort: Pulmonary effort is normal.     Breath sounds: No wheezing, rhonchi or rales.     Comments: bibasilar Abdominal:     General: Bowel sounds are normal.     Palpations: Abdomen is soft.     Tenderness: There is no abdominal tenderness.  Musculoskeletal:     Cervical back: Normal range of motion and neck supple.     Right lower leg: Edema present.     Left lower leg: Edema present.     Comments: 1+ edema BLE  Skin:    General: Skin is warm and dry.     Findings: No rash.     Comments: Venous insufficiency skin changes BLE  Neurological:     General: No focal deficit present.     Mental Status: She is alert. Mental status is at baseline.     Gait: Gait abnormal.     Comments: Oriented to person, place.   Psychiatric:        Mood and Affect: Mood normal.        Behavior: Behavior normal.        Thought Content: Thought content normal.        Judgment: Judgment normal.    Labs reviewed: Recent Labs    08/11/20 0442 08/12/20 0424 08/13/20 0410 08/14/20 0535 08/15/20 0421 09/15/20 0824  NA 134*   < > 133* 134* 134* 136  K 3.9   < > 3.8 3.6 3.8 4.1  CL 102   < > 100 105 103 100  CO2 24   < > 25 25 25 27   GLUCOSE 231*   < > 247* 168* 186* 241*  BUN 16   < > 13 14 15 15   CREATININE 0.82   < > 0.82 0.83 0.97 1.14*  CALCIUM 8.7*   < > 8.7* 8.5* 8.3* 9.2  MG 2.0  --  1.8 1.9  --   --    < > = values in this interval not displayed.   Recent Labs    08/12/20 0424 08/13/20 0410 09/15/20 0824  AST 143* 94* 31  ALT 50* 43 18  ALKPHOS 98 91 92  BILITOT 1.6* 1.3* 1.7*  PROT 5.2* 5.3* 5.8*  ALBUMIN 2.6* 2.6* 3.1*   Recent Labs     08/12/20 0424 08/13/20 0410 08/14/20 0535 09/15/20 0824  WBC 3.2* 3.5* 4.4 5.4  NEUTROABS 2.2 2.3  --  3.8  HGB 9.3* 9.7* 9.3* 10.1*  HCT 28.7* 29.5* 29.4* 32.3*  MCV 92.3 92.2 93.9 88.7  PLT 49* 50* 56* 85*   Lab Results  Component Value Date   TSH 0.895 08/08/2020   Lab Results  Component Value Date   HGBA1C 6.4 (H) 08/09/2020   Lab Results  Component Value Date   CHOL 112 11/09/2019   HDL 46 (L) 11/09/2019   LDLCALC 52 11/09/2019   TRIG 61 11/09/2019   CHOLHDL 2.4 11/09/2019    Significant Diagnostic Results in last 30 days:  No results found.  Assessment/Plan Hyponatremia Na 136 09/15/20  Anxiety takes Lorazepam  GERD (gastroesophageal reflux  disease) Stable,  takes Pantoprazole  Slow transit constipation Stable, takes Senokot S, MiraLax.    Alcoholic cirrhosis of liver with ascites (HCC) Alcoholic cirrhosis with ascites, on Spironolactone, Lactulose  Anemia in chronic kidney disease IDA 2nd to GL bleed, thrombocytopenia 2nd to cirrhosis/splenmegaly,  f/u Hematology, prn iron infusion, prn PRBC 2nd to GI bleed,  Hgb 10.1 09/15/20  Hypothyroidism TSH 0.895 08/08/20, on Levothyroxine  Chronic atrial fibrillation (HCC) chronic, takes Metoprolol  Hyperglycemia due to type 2 diabetes mellitus (HCC) T2DM, Hgb a1c 6.4 08/09/20, on Metformin, Januvia, insulin   Chronic diastolic (congestive) heart failure (HCC) Bilat LE, better, on Spironolactone, Torsemide, Bun/creat 15/1.14 eGFR 47 09/15/20             Chronic diastolic CHF, on Torsemide, Spironolactone.   Essential hypertension Runs low, may have to dc Metoprolol if low Bp persists in setting of uncontrolled tachycardia anyway.     Family/ staff Communication: plan of care reviewed with the patient and charge nurse.   Labs/tests ordered:  none  Time spend 35 minutes.

## 2020-09-15 NOTE — Assessment & Plan Note (Signed)
TSH 0.895 08/08/20, on Levothyroxine

## 2020-09-15 NOTE — Assessment & Plan Note (Signed)
Alcoholic cirrhosis with ascites, on Spironolactone, Lactulose

## 2020-09-15 NOTE — Assessment & Plan Note (Signed)
takes Lorazepam

## 2020-09-15 NOTE — Assessment & Plan Note (Signed)
Stable, takes Pantoprazole 

## 2020-09-15 NOTE — Assessment & Plan Note (Signed)
T2DM, Hgb a1c 6.4 08/09/20, on Metformin, Januvia, insulin

## 2020-09-15 NOTE — Telephone Encounter (Signed)
Appts made per 09/15/20 los   Archana Eckman

## 2020-09-15 NOTE — Assessment & Plan Note (Signed)
Bilat LE, better, on Spironolactone, Torsemide, Bun/creat 15/1.14 eGFR 47 09/15/20             Chronic diastolic CHF, on Torsemide, Spironolactone.

## 2020-09-15 NOTE — Progress Notes (Signed)
Hematology and Oncology Follow Up Visit  Jessica Dennis [id number] [birth year] 87 y.o. 09/15/2020   Principle Diagnosis:  Chronic thrombocytopenia secondary to cirrhosis and splenomegaly Iron deficiency anemia secondary to GI bleed    Current Therapy:        IV iron as indicated  Red blood cell transfusion as needed for variceal bleeding    Interim History:  Jessica Dennis is here today for follow-up.  Unfortunately, she was recently hospitalized.  She had a Streptococcus bacteremia and a Enterococcus urinary tract infection.  She thankfully got through all this.  When she was in the hospital, we had her iron levels checked.  Her ferritin was 26 with an iron saturation of 22%.  She is at [facility].  Her blood sugars are still on the high side.  This will clearly be her prognostic determination.  Her hemoglobin is not too bad today.  We will have to see what her iron studies show.  I would like to think that they should be okay.  She comes in with her daughter.  Her daughter says that she is trying to make sure she is not a lot of sugar at [facility].  She is eating okay.  She is having no problems with diarrhea.  She is having no problems with fever.  There is no rashes.  She has had no obvious bleeding.  There is been no melena or bright red blood per rectum.  Currently, I would say performance status is probably ECOG 3.    Medications:  Allergies as of 09/15/2020       Reactions   Tizanidine Other (See Comments)   Hallucinate, confused         Medication List        Accurate as of September 15, 2020  9:11 AM. If you have any questions, ask your nurse or doctor.          Accu-Chek FastClix Lancets Misc CHECK FOR BLOOD SUGAR DAILY   acetaminophen 325 MG tablet Commonly known as: TYLENOL Take 650 mg by mouth every 6 (six) hours as needed.   folic acid 1 MG tablet Commonly known as: FOLVITE Take 1 tablet (1 mg total) by mouth daily.   glucose blood test  strip 1 each by Other route 2 (two) times daily. Use as instructed-Check blood glucose prior to administering insulin   insulin aspart 100 UNIT/ML injection Commonly known as: novoLOG Inject 10 Units into the skin 3 (three) times daily. CBG more then 100   insulin glargine 100 UNIT/ML injection Commonly known as: LANTUS Inject 0.22 mLs (22 Units total) into the skin every morning.   insulin glargine 100 UNIT/ML injection Commonly known as: LANTUS Inject 0.32 mLs (32 Units total) into the skin at bedtime.   lactulose 10 GM/15ML solution Commonly known as: CHRONULAC Take 20 g by mouth daily.   latanoprost 0.005 % ophthalmic solution Commonly known as: XALATAN Place 1 drop into both eyes at bedtime.   levothyroxine 75 MCG tablet Commonly known as: SYNTHROID Take 1 tablet (75 mcg total) by mouth daily.   LORazepam 0.5 MG tablet Commonly known as: ATIVAN Take 1 tablet (0.5 mg total) by mouth at bedtime.   metFORMIN 500 MG 24 hr tablet Commonly known as: GLUCOPHAGE-XR Take 1 tablet (500 mg total) by mouth daily.   metoprolol succinate 25 MG 24 hr tablet Commonly known as: TOPROL-XL Take 12.5 mg by mouth daily.   nystatin powder Commonly known as: MYCOSTATIN/NYSTOP Apply 1 application topically  2 (two) times daily.   pantoprazole 40 MG tablet Commonly known as: PROTONIX Take 1 tablet (40 mg total) by mouth daily.   polyethylene glycol 17 g packet Commonly known as: MIRALAX / GLYCOLAX Take 17 g by mouth 2 (two) times daily.   potassium chloride SA 20 MEQ tablet Commonly known as: KLOR-CON Take 1 tablet (20 mEq total) by mouth daily.   senna-docusate 8.6-50 MG tablet Commonly known as: Senokot-S Take 1 tablet by mouth 2 (two) times daily.   simvastatin 20 MG tablet Commonly known as: ZOCOR TAKE ONE TABLET BY MOUTH EVERY NIGHT AT BEDTIME   sitaGLIPtin 50 MG tablet Commonly known as: JANUVIA Take 50 mg by mouth daily.   spironolactone 25 MG tablet Commonly  known as: Aldactone Take 1 tablet (25 mg total) by mouth daily.   torsemide 20 MG tablet Commonly known as: DEMADEX Take 1 tablet (20 mg total) by mouth daily.   vitamin C 1000 MG tablet Take 1,000 mg by mouth at bedtime.   vitamin E 180 MG (400 UNITS) capsule Take 400 Units by mouth every evening.        Allergies:  Allergies  Allergen Reactions   Tizanidine Other (See Comments)    Hallucinate, confused     Past Medical History, Surgical history, Social history, and Family History were reviewed and updated.  Review of Systems: All other 10 point review of systems is negative.   Physical Exam:  weight is 176 lb (79.8 kg). Her oral temperature is 98.4 F (36.9 C). Her blood pressure is 110/62 and her pulse is 119 (abnormal). Her respiration is 18 and oxygen saturation is 98%.   Wt Readings from Last 3 Encounters:  09/15/20 176 lb (79.8 kg)  08/24/20 200 lb 1.6 oz (90.8 kg)  08/22/20 202 lb 12.8 oz (92 kg)    Physical Exam Vitals reviewed.  HENT:     Head: Normocephalic and atraumatic.  Eyes:     Pupils: Pupils are equal, round, and reactive to light.  Cardiovascular:     Rate and Rhythm: Normal rate and regular rhythm.     Heart sounds: Normal heart sounds.     Comments: Cardiac exam is tachycardic but regular.  There is no murmurs. Pulmonary:     Effort: Pulmonary effort is normal.     Breath sounds: Normal breath sounds.  Abdominal:     General: Bowel sounds are normal.     Palpations: Abdomen is soft.  Musculoskeletal:        General: No tenderness or deformity. Normal range of motion.     Cervical back: Normal range of motion.     Comments: Extremities shows some chronic mild edema in her legs.  She has decent range of motion of her joints.  Lymphadenopathy:     Cervical: No cervical adenopathy.  Skin:    General: Skin is warm and dry.     Findings: No erythema or rash.  Neurological:     Mental Status: She is alert and oriented to person, place,  and time.  Psychiatric:        Behavior: Behavior normal.        Thought Content: Thought content normal.        Judgment: Judgment normal.    Lab Results  Component Value Date   WBC 5.4 09/15/2020   HGB 10.1 (L) 09/15/2020   HCT 32.3 (L) 09/15/2020   MCV 88.7 09/15/2020   PLT 85 (L) 09/15/2020   Lab Results  Component  Value Date   FERRITIN 26 08/05/2020   IRON 61 08/05/2020   TIBC 275 08/05/2020   UIBC 214 08/05/2020   IRONPCTSAT 22 08/05/2020   Lab Results  Component Value Date   RETICCTPCT 3.3 (H) 09/15/2020   RBC 3.62 (L) 09/15/2020   No results found for: KPAFRELGTCHN, LAMBDASER, KAPLAMBRATIO No results found for: IGGSERUM, IGA, IGMSERUM No results found for: Odetta Pink, SPEI   Chemistry      Component Value Date/Time   NA 136 09/15/2020 0824   NA 139 08/02/2020 0000   NA 138 12/18/2016 1314   NA 139 03/05/2016 1139   K 4.1 09/15/2020 0824   K 4.3 12/18/2016 1314   K 3.6 03/05/2016 1139   CL 100 09/15/2020 0824   CL 101 12/18/2016 1314   CO2 27 09/15/2020 0824   CO2 28 12/18/2016 1314   CO2 26 03/05/2016 1139   BUN 15 09/15/2020 0824   BUN 15 08/02/2020 0000   BUN 33 (H) 12/18/2016 1314   BUN 14.3 03/05/2016 1139   CREATININE 1.14 (H) 09/15/2020 0824   CREATININE 1.02 (H) 11/09/2019 1137   CREATININE 0.7 03/05/2016 1139   GLU 123 08/02/2020 0000      Component Value Date/Time   CALCIUM 9.2 09/15/2020 0824   CALCIUM 10.0 12/18/2016 1314   CALCIUM 9.3 03/05/2016 1139   ALKPHOS 92 09/15/2020 0824   ALKPHOS 94 (H) 12/18/2016 1314   ALKPHOS 127 03/05/2016 1139   AST 31 09/15/2020 0824   AST 29 03/05/2016 1139   ALT 18 09/15/2020 0824   ALT 26 12/18/2016 1314   ALT 22 03/05/2016 1139   BILITOT 1.7 (H) 09/15/2020 0824   BILITOT 1.75 (H) 03/05/2016 1139       Impression and Plan: Jessica Dennis is a very pleasant 87 yo female with chronic thrombocytopenia and iron deficiency anemia secondary to  intermittent GI blood loss.   I am happy that her platelet count is better.  I just wonder if her iron levels are lower.  Again we will have to see what her iron levels are.  She does not need any IV fluid today.  I am just happy that she got out of the hospital.  Again, it is clear that her diabetes is going to determine her prognosis.  We will plan to get her back to see [place] in October.   Volanda Napoleon, MD 8/25/20229:11 AM

## 2020-09-15 NOTE — Assessment & Plan Note (Addendum)
IDA 2nd to GL bleed, thrombocytopenia 2nd to cirrhosis/splenmegaly,  f/u Hematology, prn iron infusion, prn PRBC 2nd to GI bleed,  Hgb 10.1 09/15/20

## 2020-09-16 ENCOUNTER — Telehealth: Payer: Self-pay | Admitting: *Deleted

## 2020-09-16 NOTE — Assessment & Plan Note (Addendum)
Runs low, may have to dc Metoprolol if low Bp persists in setting of uncontrolled tachycardia anyway.

## 2020-09-16 NOTE — Telephone Encounter (Signed)
Per result note 09/16/20 called and lvm for callback to get patient scheduled for (1) dose of IV Iron.

## 2020-09-19 ENCOUNTER — Other Ambulatory Visit: Payer: Self-pay

## 2020-09-19 ENCOUNTER — Non-Acute Institutional Stay (SKILLED_NURSING_FACILITY): Payer: HMO | Admitting: Nurse Practitioner

## 2020-09-19 ENCOUNTER — Encounter: Payer: Self-pay | Admitting: Nurse Practitioner

## 2020-09-19 DIAGNOSIS — D631 Anemia in chronic kidney disease: Secondary | ICD-10-CM

## 2020-09-19 DIAGNOSIS — E871 Hypo-osmolality and hyponatremia: Secondary | ICD-10-CM

## 2020-09-19 DIAGNOSIS — E1165 Type 2 diabetes mellitus with hyperglycemia: Secondary | ICD-10-CM

## 2020-09-19 DIAGNOSIS — K219 Gastro-esophageal reflux disease without esophagitis: Secondary | ICD-10-CM

## 2020-09-19 DIAGNOSIS — N1832 Chronic kidney disease, stage 3b: Secondary | ICD-10-CM

## 2020-09-19 DIAGNOSIS — K7031 Alcoholic cirrhosis of liver with ascites: Secondary | ICD-10-CM

## 2020-09-19 DIAGNOSIS — I482 Chronic atrial fibrillation, unspecified: Secondary | ICD-10-CM

## 2020-09-19 DIAGNOSIS — E039 Hypothyroidism, unspecified: Secondary | ICD-10-CM

## 2020-09-19 DIAGNOSIS — W19XXXA Unspecified fall, initial encounter: Secondary | ICD-10-CM | POA: Diagnosis not present

## 2020-09-19 DIAGNOSIS — I5032 Chronic diastolic (congestive) heart failure: Secondary | ICD-10-CM | POA: Diagnosis not present

## 2020-09-19 DIAGNOSIS — Y92009 Unspecified place in unspecified non-institutional (private) residence as the place of occurrence of the external cause: Secondary | ICD-10-CM

## 2020-09-19 DIAGNOSIS — K5901 Slow transit constipation: Secondary | ICD-10-CM

## 2020-09-19 DIAGNOSIS — Z794 Long term (current) use of insulin: Secondary | ICD-10-CM

## 2020-09-19 DIAGNOSIS — F419 Anxiety disorder, unspecified: Secondary | ICD-10-CM

## 2020-09-19 MED ORDER — LORAZEPAM 0.5 MG PO TABS: 0.5000 mg | ORAL_TABLET | Freq: Every day | ORAL | 0 refills | Status: DC

## 2020-09-19 NOTE — Assessment & Plan Note (Signed)
TSH 0.895 08/08/20, on Levothyroxine

## 2020-09-19 NOTE — Assessment & Plan Note (Signed)
takes Lorazepam

## 2020-09-19 NOTE — Assessment & Plan Note (Signed)
T2DM, Hgb a1c 6.4 08/09/20, on Metformin, Januvia, insulin

## 2020-09-19 NOTE — Telephone Encounter (Signed)
Medication refill request received from [facility] for Lorazepam 0.5 mg tablet one at bedtime. Medication pended and sent to Man X Mast, NP for approval.

## 2020-09-19 NOTE — Assessment & Plan Note (Signed)
,   takes Senokot S, MiraLax. 

## 2020-09-19 NOTE — Assessment & Plan Note (Signed)
takes Pantoprazole 

## 2020-09-19 NOTE — Assessment & Plan Note (Signed)
fall 09/18/20 when the patient was found on floor in front of bed, no apparent injury, moved all limbs w/o pain. Frailty, needs assistance for transfer, lack of safety awareness are contributory, close supervision/assistance for safety.

## 2020-09-19 NOTE — Assessment & Plan Note (Signed)
Bilat LE, better, on Spironolactone, Torsemide, Bun/creat 15/1.14 eGFR 47 09/15/20.

## 2020-09-19 NOTE — Progress Notes (Signed)
Location:   [place] Room Number: [place] of Service:  SNF [phone]) Provider:  Zaley Talley Otho Darner, NP  Virgie Dad, MD  Patient Care Team: Virgie Dad, MD as PCP - General (Internal Medicine) Berniece Salines, DO as PCP - Cardiology (Cardiology) Sheryn Bison, MD as Referring Physician (Dermatology) Gatha Mayer, MD as Consulting Physician (Gastroenterology) Marin Olp Rudell Cobb, MD as Consulting Physician (Oncology) Janan Ridge, MD as Consulting Physician (Dermatology) Paralee Cancel, MD as Consulting Physician (Orthopedic Surgery) Jacelyn Pi, MD as Consulting Physician (Endocrinology) Day, Melvenia Beam, [facility] (Inactive) as Pharmacist (Pharmacist) Berniece Salines, DO as Consulting Physician (Cardiology)  Extended Emergency Contact Information Primary Emergency Contact: Trinidad Curet [place] Phone: [phone] Mobile Phone: [phone] Relation: Son Secondary Emergency Contact: Lovena, Kluck Mobile Phone: [phone] Relation: Relative Preferred language: English Interpreter needed? No  Code Status:  DNR Goals of care: Advanced Directive information Advanced Directives 09/20/2020  Does Patient Have a Medical Advance Directive? Yes  Type of Advance Directive Living will;Out of facility DNR (pink MOST or yellow form)  Does patient want to make changes to medical advance directive? No - Patient declined  Copy of [place] in Chart? Yes - validated most recent copy scanned in chart (See row information)  Would patient like information on creating a medical advance directive? -  Pre-existing out of facility DNR order (yellow form or pink MOST form) Yellow form placed in chart (order not valid for inpatient use)     Chief Complaint  Patient presents with   Acute Visit    Patient presents after a fall    HPI:  Pt is a 87 y.o. female seen today for an acute visit for fall 09/18/20 when the patient was found on floor  in front of bed, no apparent injury, moved all limbs w/o pain.   Bilat LE, better, on Spironolactone, Torsemide, Bun/creat 15/1.14 eGFR 47 09/15/20             Chronic diastolic CHF, on Torsemide, Spironolactone.              T2DM, Hgb a1c 6.4 08/09/20, on Metformin, Januvia, insulin              Tachycardia, chronic, takes Metoprolol             Hypothyroidism, TSH 0.895 08/08/20, on Levothyroxine             Anemia, f/u Hematology, on iron infusion. Hgb 10.1 09/15/20             Alcoholic cirrhosis with ascites, on Spironolactone, Lactulose             Constipation, takes Senokot S, MiraLax.                    GERD, takes Pantoprazole             Anxiety, takes Lorazepam             Hyponatremia, Na 136 09/15/20  Past Medical History:  Diagnosis Date   Anemia    Anemia of chronic renal failure, stage 3 (moderate) ([place]) 03/24/2020   Angiodysplasia of ascending colon 10/25/2014   Anxiety    Arthritis    Borderline diabetes    Cancer of the skin, basal cell 09/03/2012   Cirrhosis ([place])    Diabetes mellitus without complication ([place])    Diverticular disease    GAVE (gastric antral vascular ectasia) 09/09/2017   GERD (gastroesophageal  reflux disease)    Hypertension    Iron deficiency anemia due to chronic blood loss 01/19/2015   Portal hypertensive gastropathy ([place]) 08/02/2016   ? Some GAVE also   Thyroid disease    Past Surgical History:  Procedure Laterality Date   ABDOMINAL HYSTERECTOMY     APPENDECTOMY     COLONOSCOPY     ESOPHAGOGASTRODUODENOSCOPY     ESOPHAGOGASTRODUODENOSCOPY (EGD) WITH PROPOFOL N/A 02/22/2020   Procedure: ESOPHAGOGASTRODUODENOSCOPY (EGD) WITH PROPOFOL;  Surgeon: Gatha Mayer, MD;  Location: [facility];  Service: Endoscopy;  Laterality: N/A;   IR PARACENTESIS  05/25/2016   IR THORACENTESIS ASP PLEURAL SPACE W/IMG GUIDE  02/23/2020   LEG SKIN LESION  BIOPSY / EXCISION  12/11/14   MOHS SURGERY     ankle   TONSILLECTOMY     TOTAL HIP ARTHROPLASTY Bilateral 1993,  2006   UPPER GASTROINTESTINAL ENDOSCOPY  09/09/2017    Allergies  Allergen Reactions   Tizanidine Other (See Comments)    Hallucinate, confused     Allergies as of 09/19/2020       Reactions   Tizanidine Other (See Comments)   Hallucinate, confused         Medication List        Accurate as of September 19, 2020 11:59 PM. If you have any questions, ask your nurse or doctor.          acetaminophen 325 MG tablet Commonly known as: TYLENOL Take 650 mg by mouth every 6 (six) hours as needed.   folic acid 1 MG tablet Commonly known as: FOLVITE Take 1 tablet (1 mg total) by mouth daily.   insulin aspart 100 UNIT/ML injection Commonly known as: novoLOG Inject 10 Units into the skin 3 (three) times daily. CBG more then 100   insulin glargine 100 UNIT/ML injection Commonly known as: LANTUS Inject 0.22 mLs (22 Units total) into the skin every morning.   insulin glargine 100 UNIT/ML injection Commonly known as: LANTUS Inject 0.32 mLs (32 Units total) into the skin at bedtime.   lactulose 10 GM/15ML solution Commonly known as: CHRONULAC Take 20 g by mouth daily.   latanoprost 0.005 % ophthalmic solution Commonly known as: XALATAN Place 1 drop into both eyes at bedtime.   levothyroxine 75 MCG tablet Commonly known as: SYNTHROID Take 1 tablet (75 mcg total) by mouth daily.   LORazepam 0.5 MG tablet Commonly known as: ATIVAN Take 1 tablet (0.5 mg total) by mouth at bedtime.   metFORMIN 500 MG 24 hr tablet Commonly known as: GLUCOPHAGE-XR Take 1 tablet (500 mg total) by mouth daily.   metoprolol succinate 25 MG 24 hr tablet Commonly known as: TOPROL-XL Take 12.5 mg by mouth daily.   nystatin powder Commonly known as: MYCOSTATIN/NYSTOP Apply 1 application topically 2 (two) times daily.   pantoprazole 40 MG tablet Commonly known as: PROTONIX Take 1 tablet (40 mg total) by mouth daily.   polyethylene glycol 17 g packet Commonly known as: MIRALAX /  GLYCOLAX Take 17 g by mouth 2 (two) times daily.   potassium chloride SA 20 MEQ tablet Commonly known as: KLOR-CON Take 1 tablet (20 mEq total) by mouth daily.   senna-docusate 8.6-50 MG tablet Commonly known as: Senokot-S Take 1 tablet by mouth 2 (two) times daily.   simvastatin 20 MG tablet Commonly known as: ZOCOR TAKE ONE TABLET BY MOUTH EVERY NIGHT AT BEDTIME   sitaGLIPtin 50 MG tablet Commonly known as: JANUVIA Take 50 mg by mouth daily.   spironolactone  25 MG tablet Commonly known as: Aldactone Take 1 tablet (25 mg total) by mouth daily.   torsemide 20 MG tablet Commonly known as: DEMADEX Take 1 tablet (20 mg total) by mouth daily.   vitamin C 1000 MG tablet Take 1,000 mg by mouth at bedtime.   vitamin E 180 MG (400 UNITS) capsule Take 400 Units by mouth every evening.        Review of Systems  Constitutional:  Negative for activity change, appetite change and fever.  HENT:  Positive for hearing loss. Negative for congestion and trouble swallowing.   Eyes:  Negative for visual disturbance.  Respiratory:  Positive for shortness of breath. Negative for cough.        DOE  Cardiovascular:  Positive for leg swelling. Negative for chest pain and palpitations.  Gastrointestinal:  Negative for abdominal pain and constipation.  Genitourinary:  Negative for difficulty urinating, dysuria and urgency.  Musculoskeletal:  Positive for arthralgias. Negative for gait problem.  Skin:  Negative for color change.  Neurological:  Negative for speech difficulty, weakness and light-headedness.  Psychiatric/Behavioral:  Negative for behavioral problems and sleep disturbance. The patient is not nervous/anxious.    Immunization History  Administered Date(s) Administered   Fluad Quad(high Dose 65+) 10/27/2018, 02/09/2020   Hepatitis B, adult 05/12/2014   Hepatitis B, ped/adol 08/10/2014   Influenza Split 11/02/2009, 10/15/2010   Influenza, High Dose Seasonal PF 10/13/2014,  01/26/2017   Influenza, Quadrivalent, Recombinant, Inj, Pf 09/30/2017   Influenza, Seasonal, Injecte, Preservative Fre 10/13/2014   Influenza,inj,quad, With Preservative 11/22/2016   Influenza-Unspecified 11/25/2013, 10/06/2015, 10/22/2017   Moderna Sars-Covid-2 Vaccination 03/26/2019, 04/23/2019, 06/21/2020   PFIZER(Purple Top)SARS-COV-2 Vaccination 11/21/2019   Pneumococcal Conjugate-13 05/17/2014   Pneumococcal Polysaccharide-23 10/06/2015   Tdap 10/02/2011, 11/06/2016   Zoster, Live 01/22/2005   Pertinent  Health Maintenance Due  Topic Date Due   FOOT EXAM  05/12/2015   URINE MICROALBUMIN  04/27/2020   OPHTHALMOLOGY EXAM  05/17/2020   INFLUENZA VACCINE  08/22/2020   HEMOGLOBIN A1C  02/09/2021   DEXA SCAN  Completed   PNA vac Low Risk Adult  Addressed   COLONOSCOPY (Pts 45-61yr Insurance coverage will need to be confirmed)  Discontinued   Fall Risk  04/28/2019 01/09/2018 10/26/2016 10/09/2016 11/25/2015  Falls in the past year? 1 0 Yes No Yes  Number falls in past yr: 0 - 2 or more - 2 or more  Injury with Fall? 0 - No - Yes  Comment - - - - -  Risk Factor Category  - - High Fall Risk - High Fall Risk  Risk for fall due to : - - Impaired balance/gait - -  Follow up Falls evaluation completed - - - Falls prevention discussed   Functional Status Survey:    Vitals:   09/19/20 1534  BP: 99/61  Pulse: (!) 112  Resp: 18  Temp: 98.8 F (37.1 C)  SpO2: 99%  Weight: 175 lb 8 oz (79.6 kg)  Height: 5' 6"  (1.676 m)   Body mass index is 28.33 kg/m. Physical Exam Vitals and nursing note reviewed.  Constitutional:      Appearance: Normal appearance.  HENT:     Head: Normocephalic and atraumatic.     Mouth/Throat:     Mouth: Mucous membranes are moist.  Eyes:     Extraocular Movements: Extraocular movements intact.     Conjunctiva/sclera: Conjunctivae normal.     Pupils: Pupils are equal, round, and reactive to light.  Cardiovascular:  Rate and Rhythm: Tachycardia  present.     Heart sounds: No murmur heard. Pulmonary:     Effort: Pulmonary effort is normal.     Breath sounds: No wheezing, rhonchi or rales.     Comments: bibasilar Abdominal:     General: Bowel sounds are normal.     Palpations: Abdomen is soft.     Tenderness: There is no abdominal tenderness.  Musculoskeletal:     Cervical back: Normal range of motion and neck supple.     Right lower leg: Edema present.     Left lower leg: Edema present.     Comments: 1+ edema BLE  Skin:    General: Skin is warm and dry.     Findings: No rash.     Comments: Venous insufficiency skin changes BLE  Neurological:     General: No focal deficit present.     Mental Status: She is alert. Mental status is at baseline.     Gait: Gait abnormal.     Comments: Oriented to person, place.   Psychiatric:        Mood and Affect: Mood normal.        Behavior: Behavior normal.        Thought Content: Thought content normal.        Judgment: Judgment normal.    Labs reviewed: Recent Labs    08/11/20 0442 08/12/20 0424 08/13/20 0410 08/14/20 0535 08/15/20 0421 08/23/20 0000 09/06/20 0000 09/09/20 0000 09/15/20 0824  NA 134*   < > 133* 134* 134*   < > 143 138 136  K 3.9   < > 3.8 3.6 3.8   < > 3.0* 3.5 4.1  CL 102   < > 100 105 103   < > 103 103 100  CO2 24   < > 25 25 25    < > 31* 24* 27  GLUCOSE 231*   < > 247* 168* 186*  --   --   --  241*  BUN 16   < > 13 14 15    < > 16 18 15   CREATININE 0.82   < > 0.82 0.83 0.97   < > 1.2* 1.3* 1.14*  CALCIUM 8.7*   < > 8.7* 8.5* 8.3*   < > 8.7 8.5* 9.2  MG 2.0  --  1.8 1.9  --   --   --   --   --    < > = values in this interval not displayed.   Recent Labs    08/12/20 0424 08/13/20 0410 08/23/20 0000 08/30/20 0000 09/06/20 0000 09/15/20 0824  AST 143* 94*   < > 32 24 31  ALT 50* 43   < > 14 15 18   ALKPHOS 98 91   < > 90 89 92  BILITOT 1.6* 1.3*  --   --   --  1.7*  PROT 5.2* 5.3*  --   --   --  5.8*  ALBUMIN 2.6* 2.6*   < > 2.7* 2.9* 3.1*    < > = values in this interval not displayed.   Recent Labs    08/13/20 0410 08/14/20 0535 08/23/20 0000 09/15/20 0824  WBC 3.5* 4.4 3.4 5.4  NEUTROABS 2.3  --  2,213.00 3.8  HGB 9.7* 9.3* 9.2* 10.1*  HCT 29.5* 29.4* 29* 32.3*  MCV 92.2 93.9  --  88.7  PLT 50* 56* 73* 85*   Lab Results  Component Value Date   TSH 0.895 08/08/2020  Lab Results  Component Value Date   HGBA1C 6.4 (H) 08/09/2020   Lab Results  Component Value Date   CHOL 112 11/09/2019   HDL 46 (L) 11/09/2019   LDLCALC 52 11/09/2019   TRIG 61 11/09/2019   CHOLHDL 2.4 11/09/2019    Significant Diagnostic Results in last 30 days:  No results found.  Assessment/Plan Fall at home, initial encounter fall 09/18/20 when the patient was found on floor in front of bed, no apparent injury, moved all limbs w/o pain. Frailty, needs assistance for transfer, lack of safety awareness are contributory, close supervision/assistance for safety.   Chronic diastolic (congestive) heart failure (HCC) Bilat LE, better, on Spironolactone, Torsemide, Bun/creat 15/1.14 eGFR 47 09/15/20.   Hyperglycemia due to type 2 diabetes mellitus (HCC) T2DM, Hgb a1c 6.4 08/09/20, on Metformin, Januvia, insulin   Chronic atrial fibrillation (HCC) ronic, takes Metoprolol  Adult hypothyroidism TSH 0.895 08/08/20, on Levothyroxine  Anemia in chronic kidney disease  f/u Hematology, on iron infusion. Hgb 38.1 8/40/37  Alcoholic cirrhosis of liver with ascites (HCC) Alcoholic cirrhosis with ascites, on Spironolactone, Lactulose  Slow transit constipation , takes Senokot S, MiraLax.         GERD (gastroesophageal reflux disease) takes Pantoprazole  Anxiety takes Lorazepam  Hyponatremia  Na 136 09/15/20    Family/ staff Communication: plan of care reviewed with the patient and charge nurse.   Labs/tests ordered:  none  Time spend 35 minutes.

## 2020-09-19 NOTE — Assessment & Plan Note (Signed)
f/u Hematology, on iron infusion. Hgb 10.1 09/15/20

## 2020-09-19 NOTE — Assessment & Plan Note (Signed)
Na 136 09/15/20

## 2020-09-19 NOTE — Assessment & Plan Note (Signed)
ronic, takes Metoprolol

## 2020-09-19 NOTE — Assessment & Plan Note (Signed)
Alcoholic cirrhosis with ascites, on Spironolactone, Lactulose

## 2020-09-20 ENCOUNTER — Non-Acute Institutional Stay (SKILLED_NURSING_FACILITY): Payer: HMO | Admitting: Internal Medicine

## 2020-09-20 ENCOUNTER — Encounter: Payer: Self-pay | Admitting: Nurse Practitioner

## 2020-09-20 ENCOUNTER — Encounter: Payer: Self-pay | Admitting: Internal Medicine

## 2020-09-20 DIAGNOSIS — N1832 Chronic kidney disease, stage 3b: Secondary | ICD-10-CM | POA: Diagnosis not present

## 2020-09-20 DIAGNOSIS — D631 Anemia in chronic kidney disease: Secondary | ICD-10-CM

## 2020-09-20 DIAGNOSIS — E1165 Type 2 diabetes mellitus with hyperglycemia: Secondary | ICD-10-CM

## 2020-09-20 DIAGNOSIS — R4189 Other symptoms and signs involving cognitive functions and awareness: Secondary | ICD-10-CM

## 2020-09-20 DIAGNOSIS — K7031 Alcoholic cirrhosis of liver with ascites: Secondary | ICD-10-CM

## 2020-09-20 DIAGNOSIS — E039 Hypothyroidism, unspecified: Secondary | ICD-10-CM

## 2020-09-20 DIAGNOSIS — I5032 Chronic diastolic (congestive) heart failure: Secondary | ICD-10-CM

## 2020-09-20 DIAGNOSIS — Z794 Long term (current) use of insulin: Secondary | ICD-10-CM

## 2020-09-20 NOTE — Progress Notes (Signed)
Location:   [place] Room Number: [id number] Place of Service:  SNF [phone])  Provider: Veleta Miners MD  PCP: Virgie Dad, MD Patient Care Team: Virgie Dad, MD as PCP - General (Internal Medicine) Berniece Salines, DO as PCP - Cardiology (Cardiology) Sheryn Bison, MD as Referring Physician (Dermatology) Gatha Mayer, MD as Consulting Physician (Gastroenterology) Marin Olp Rudell Cobb, MD as Consulting Physician (Oncology) Janan Ridge, MD as Consulting Physician (Dermatology) Paralee Cancel, MD as Consulting Physician (Orthopedic Surgery) Jacelyn Pi, MD as Consulting Physician (Endocrinology) Day, Melvenia Beam, [facility] (Inactive) as Pharmacist (Pharmacist) Berniece Salines, DO as Consulting Physician (Cardiology)  Extended Emergency Contact Information Primary Emergency Contact: Trinidad Curet [place] Phone: [phone] Mobile Phone: [phone] Relation: Son Secondary Emergency Contact: Alijah, Huso Mobile Phone: [phone] Relation: Relative Preferred language: English Interpreter needed? No  Code Status: DNR Managed Care Goals of care:  Advanced Directive information Advanced Directives 09/20/2020  Does Patient Have a Medical Advance Directive? Yes  Type of Advance Directive Living will;Out of facility DNR (pink MOST or yellow form)  Does patient want to make changes to medical advance directive? No - Patient declined  Copy of [place] in Chart? Yes - validated most recent copy scanned in chart (See row information)  Would patient like information on creating a medical advance directive? -  Pre-existing out of facility DNR order (yellow form or pink MOST form) Yellow form placed in chart (order not valid for inpatient use)     Allergies  Allergen Reactions   Tizanidine Other (See Comments)    Hallucinate, confused     Chief Complaint  Patient presents with   Acute Visit    HPI:  87 y.o. female  For  Discharge from the SNF in to [place]   Has h/o diabetes mellitus type 2 with neuropathy, history of alcoholic liver cirrhosis, Portal hypertensive gastropathy s/p EGD Diastolic CHF with Recurent Pleural Effusion and Iron Def Anemia Anemia and thrombocytopenia due to cirrhosis and splenomegaly History of hypertension and hypothyroidism  Recent admitted in the hospital from 7/18-7/25 for sepsis secondary to UTI  Patient is planning to go back to her room in [place] now Continues to struggle Cognitively. Is doing her ADLS but sometimes does not change her clothes and Pullups and Need helps Also had another fall yesterday  Says feel dizzy sometime when stands up [facility] with her walker Her issue continues to be her CBG Nurses are worried that she would Indulge again when she moves to [place]. In here her CBG have been controlled on Bolus Insulin   Past Medical History:  Diagnosis Date   Anemia    Anemia of chronic renal failure, stage 3 (moderate) ([place]) 03/24/2020   Angiodysplasia of ascending colon 10/25/2014   Anxiety    Arthritis    Borderline diabetes    Cancer of the skin, basal cell 09/03/2012   Cirrhosis ([place])    Diabetes mellitus without complication ([place])    Diverticular disease    GAVE (gastric antral vascular ectasia) 09/09/2017   GERD (gastroesophageal reflux disease)    Hypertension    Iron deficiency anemia due to chronic blood loss 01/19/2015   Portal hypertensive gastropathy ([place]) 08/02/2016   ? Some GAVE also   Thyroid disease     Past Surgical History:  Procedure Laterality Date   ABDOMINAL HYSTERECTOMY     APPENDECTOMY     COLONOSCOPY     ESOPHAGOGASTRODUODENOSCOPY     ESOPHAGOGASTRODUODENOSCOPY (EGD)  WITH PROPOFOL N/A 02/22/2020   Procedure: ESOPHAGOGASTRODUODENOSCOPY (EGD) WITH PROPOFOL;  Surgeon: Gatha Mayer, MD;  Location: [facility];  Service: Endoscopy;  Laterality: N/A;   IR PARACENTESIS  05/25/2016   IR THORACENTESIS ASP PLEURAL SPACE W/IMG GUIDE  02/23/2020   LEG  SKIN LESION  BIOPSY / EXCISION  12/11/14   MOHS SURGERY     ankle   TONSILLECTOMY     TOTAL HIP ARTHROPLASTY Bilateral 1993, 2006   UPPER GASTROINTESTINAL ENDOSCOPY  09/09/2017      reports that she has never smoked. She has never used smokeless tobacco. She reports current alcohol use. She reports that she does not use drugs. Social History   Socioeconomic History   Marital status: Divorced    Spouse name: Not on file   Number of children: 2   Years of education: Not on file   Highest education level: Not on file  Occupational History   Occupation: retired  Tobacco Use   Smoking status: Never   Smokeless tobacco: Never  Vaping Use   Vaping Use: Never used  Substance and Sexual Activity   Alcohol use: Yes    Comment: weekly   Drug use: No   Sexual activity: Not on file  Other Topics Concern   Not on file  Social History Narrative   The patient is divorced. She is originally from [place]. She has 2 sons. 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   08/10/2014   Social Determinants of Health   Financial Resource Strain: Not on file  Food Insecurity: Not on file  Transportation Needs: Not on file  Physical Activity: Not on file  Stress: Not on file  Social Connections: Not on file  Intimate Partner Violence: Not on file   Functional Status Survey:    Allergies  Allergen Reactions   Tizanidine Other (See Comments)    Hallucinate, confused     Pertinent  Health Maintenance Due  Topic Date Due   FOOT EXAM  05/12/2015   URINE MICROALBUMIN  04/27/2020   OPHTHALMOLOGY EXAM  05/17/2020   INFLUENZA VACCINE  08/22/2020   HEMOGLOBIN A1C  02/09/2021   DEXA SCAN  Completed   PNA vac Low Risk Adult  Addressed   COLONOSCOPY (Pts 45-104yr Insurance coverage will need to be confirmed)  Discontinued    Medications: Allergies as of 09/20/2020       Reactions   Tizanidine Other (See Comments)   Hallucinate, confused         Medication  List        Accurate as of September 20, 2020 11:22 AM. If you have any questions, ask your nurse or doctor.          acetaminophen 325 MG tablet Commonly known as: TYLENOL Take 650 mg by mouth every 6 (six) hours as needed.   folic acid 1 MG tablet Commonly known as: FOLVITE Take 1 tablet (1 mg total) by mouth daily.   insulin aspart 100 UNIT/ML injection Commonly known as: novoLOG Inject 10 Units into the skin 3 (three) times daily. CBG more then 100   insulin glargine 100 UNIT/ML injection Commonly known as: LANTUS Inject 0.22 mLs (22 Units total) into the skin every morning.   insulin glargine 100 UNIT/ML injection Commonly known as: LANTUS Inject 0.32 mLs (32 Units total) into the skin at bedtime.   lactulose 10 GM/15ML solution Commonly known as: CHRONULAC Take 20 g by mouth daily.   latanoprost 0.005 % ophthalmic solution  Commonly known as: XALATAN Place 1 drop into both eyes at bedtime.   levothyroxine 75 MCG tablet Commonly known as: SYNTHROID Take 1 tablet (75 mcg total) by mouth daily.   LORazepam 0.5 MG tablet Commonly known as: ATIVAN Take 1 tablet (0.5 mg total) by mouth at bedtime.   metFORMIN 500 MG 24 hr tablet Commonly known as: GLUCOPHAGE-XR Take 1 tablet (500 mg total) by mouth daily.   metoprolol succinate 25 MG 24 hr tablet Commonly known as: TOPROL-XL Take 12.5 mg by mouth daily.   nystatin powder Commonly known as: MYCOSTATIN/NYSTOP Apply 1 application topically 2 (two) times daily.   pantoprazole 40 MG tablet Commonly known as: PROTONIX Take 1 tablet (40 mg total) by mouth daily.   polyethylene glycol 17 g packet Commonly known as: MIRALAX / GLYCOLAX Take 17 g by mouth 2 (two) times daily.   potassium chloride SA 20 MEQ tablet Commonly known as: KLOR-CON Take 1 tablet (20 mEq total) by mouth daily.   senna-docusate 8.6-50 MG tablet Commonly known as: Senokot-S Take 1 tablet by mouth 2 (two) times daily.   simvastatin 20  MG tablet Commonly known as: ZOCOR TAKE ONE TABLET BY MOUTH EVERY NIGHT AT BEDTIME   sitaGLIPtin 50 MG tablet Commonly known as: JANUVIA Take 50 mg by mouth daily.   spironolactone 25 MG tablet Commonly known as: Aldactone Take 1 tablet (25 mg total) by mouth daily.   torsemide 20 MG tablet Commonly known as: DEMADEX Take 1 tablet (20 mg total) by mouth daily. What changed: when to take this   vitamin C 1000 MG tablet Take 1,000 mg by mouth at bedtime.   vitamin E 180 MG (400 UNITS) capsule Take 400 Units by mouth every evening.        Review of Systems  Constitutional:  Positive for activity change and unexpected weight change.  HENT: Negative.    Cardiovascular:  Positive for leg swelling.  Gastrointestinal: Negative.   Genitourinary: Negative.   Musculoskeletal:  Positive for gait problem.  Skin: Negative.   Neurological:  Positive for dizziness.  Psychiatric/Behavioral:  Positive for confusion.    Vitals:   09/20/20 1044  BP: (!) 97/59  Pulse: 72  Resp: (!) 22  Temp: (!) 96.6 F (35.9 C)  SpO2: 92%  Weight: 175 lb 8 oz (79.6 kg)  Height: '5\' 7"'$  (1.702 m)   Body mass index is 27.49 kg/m. Physical Exam Constitutional:  Well-developed and well-nourished.  HENT:  Head: Normocephalic.  Mouth/Throat: Oropharynx is clear and moist.  Eyes: Pupils are equal, round, and reactive to light.  Neck: Neck supple.  Cardiovascular: Normal rate and normal heart sounds.  No murmur heard. Pulmonary/Chest: Effort normal and breath sounds normal. No respiratory distress. No wheezes. She has no rales.  Abdominal: Soft. Bowel sounds are normal. No distension. There is no tenderness. There is no rebound.  Musculoskeletal: Moderate Edema Bilateral Lymphadenopathy: none Neurological: No Focal Deficits Skin: Skin is warm and dry.  Psychiatric: Normal mood and affect. Behavior is normal. Thought content normal.   Labs reviewed: Basic Metabolic Panel: Recent Labs     08/11/20 0442 08/12/20 0424 08/13/20 0410 08/14/20 0535 08/15/20 0421 08/23/20 0000 09/06/20 0000 09/09/20 0000 09/15/20 0824  NA 134*   < > 133* 134* 134*   < > 143 138 136  K 3.9   < > 3.8 3.6 3.8   < > 3.0* 3.5 4.1  CL 102   < > 100 105 103   < > 103  103 100  CO2 24   < > '25 25 25   '$ < > 31* 24* 27  GLUCOSE 231*   < > 247* 168* 186*  --   --   --  241*  BUN 16   < > '13 14 15   '$ < > '16 18 15  '$ CREATININE 0.82   < > 0.82 0.83 0.97   < > 1.2* 1.3* 1.14*  CALCIUM 8.7*   < > 8.7* 8.5* 8.3*   < > 8.7 8.5* 9.2  MG 2.0  --  1.8 1.9  --   --   --   --   --    < > = values in this interval not displayed.   Liver Function Tests: Recent Labs    08/12/20 0424 08/13/20 0410 08/23/20 0000 08/30/20 0000 09/06/20 0000 09/15/20 0824  AST 143* 94*   < > 32 24 31  ALT 50* 43   < > '14 15 18  '$ ALKPHOS 98 91   < > 90 89 92  BILITOT 1.6* 1.3*  --   --   --  1.7*  PROT 5.2* 5.3*  --   --   --  5.8*  ALBUMIN 2.6* 2.6*   < > 2.7* 2.9* 3.1*   < > = values in this interval not displayed.   Recent Labs    08/08/20 1748  LIPASE 50   Recent Labs    01/16/20 1213 01/17/20 1048 08/08/20 1750  AMMONIA 71* 53* 31   CBC: Recent Labs    08/13/20 0410 08/14/20 0535 08/23/20 0000 09/15/20 0824  WBC 3.5* 4.4 3.4 5.4  NEUTROABS 2.3  --  2,213.00 3.8  HGB 9.7* 9.3* 9.2* 10.1*  HCT 29.5* 29.4* 29* 32.3*  MCV 92.2 93.9  --  88.7  PLT 50* 56* 73* 85*   Cardiac Enzymes: Recent Labs    10/16/19 1958 10/18/19 0736  CKTOTAL 1,431* 208   BNP: Invalid input(s): POCBNP CBG: Recent Labs    08/15/20 1302 08/15/20 1615 08/15/20 2045  GLUCAP 290* 198* 174*    Procedures and Imaging Studies During Stay: No results found.  Assessment/Plan:    Chronic diastolic (congestive) heart failure (HCC) Will reduce her Demadex back to 20 mg QD Use PRN dose if gains weight again Creat stable She gets Dizzy with higher doses of Demadex  Type 2 diabetes mellitus with hyperglycemia, with long-term  current use of insulin (HCC) Continue Lantus and Bolus insulin with Meals CBG here are running in 150-200 Also on Metformin and Januvia Also Follows with Dr Chalmers Cater but per Nurses she does not follow her Diet when in [place] But family does not want any aggressive control  Low BP with fall Back up on Demadex Continue Metoprolol for Atrial tachycardia for now  Adult hypothyroidism TSH normal in 7/22 Anemia in stage 3b chronic kidney disease ([place]) Follows with Hematology for Iron shots and Transfusion if needed Alcoholic cirrhosis of liver with ascites ([place]) Continue Aldactone Not candidate for Aggressive treatment Also on Lactuolse Stage 3b chronic kidney disease ([place]) Creat stable for now  Cognitive impairment Discussed with Therapy and Nurse and scoial worker Patient would benefot with Skilled but at this time will try [place] for cost reason  Future labs/tests needed:

## 2020-10-03 ENCOUNTER — Inpatient Hospital Stay: Payer: HMO | Attending: Hematology & Oncology

## 2020-10-03 ENCOUNTER — Other Ambulatory Visit: Payer: Self-pay

## 2020-10-03 VITALS — BP 106/57 | HR 116 | Temp 98.7°F | Resp 22

## 2020-10-03 DIAGNOSIS — K5520 Angiodysplasia of colon without hemorrhage: Secondary | ICD-10-CM

## 2020-10-03 DIAGNOSIS — R195 Other fecal abnormalities: Secondary | ICD-10-CM

## 2020-10-03 DIAGNOSIS — K219 Gastro-esophageal reflux disease without esophagitis: Secondary | ICD-10-CM

## 2020-10-03 DIAGNOSIS — N1832 Chronic kidney disease, stage 3b: Secondary | ICD-10-CM

## 2020-10-03 DIAGNOSIS — D500 Iron deficiency anemia secondary to blood loss (chronic): Secondary | ICD-10-CM | POA: Insufficient documentation

## 2020-10-03 DIAGNOSIS — K922 Gastrointestinal hemorrhage, unspecified: Secondary | ICD-10-CM | POA: Insufficient documentation

## 2020-10-03 DIAGNOSIS — D631 Anemia in chronic kidney disease: Secondary | ICD-10-CM

## 2020-10-03 MED ORDER — SODIUM CHLORIDE 0.9 % IV SOLN
510.0000 mg | Freq: Once | INTRAVENOUS | Status: AC
  Administered 2020-10-03: 510 mg via INTRAVENOUS
  Filled 2020-10-03: qty 510

## 2020-10-03 MED ORDER — SODIUM CHLORIDE 0.9 % IV SOLN: Freq: Once | INTRAVENOUS | Status: AC

## 2020-10-03 NOTE — Progress Notes (Signed)
Pt PIV removed by patient inadvertently. Pt reschedule for next week. Placed call to Sharee Pimple ( Daughter in [employer]) discussed appt and informed Sharee Pimple about pt right foot is swollen, red and weeping from sore on top of foot. Pt could not recall how long her foot had been this way.  Sharee Pimple verbalized she would [facility] this [facility] and confirmed pts appt date/time for next week. No further concerns. Called Tommy to come and pick pt up at number he provided.

## 2020-10-04 ENCOUNTER — Non-Acute Institutional Stay: Payer: HMO | Admitting: Internal Medicine

## 2020-10-04 DIAGNOSIS — I5032 Chronic diastolic (congestive) heart failure: Secondary | ICD-10-CM

## 2020-10-04 DIAGNOSIS — I959 Hypotension, unspecified: Secondary | ICD-10-CM

## 2020-10-04 DIAGNOSIS — E1165 Type 2 diabetes mellitus with hyperglycemia: Secondary | ICD-10-CM

## 2020-10-04 DIAGNOSIS — D631 Anemia in chronic kidney disease: Secondary | ICD-10-CM

## 2020-10-04 DIAGNOSIS — N1832 Chronic kidney disease, stage 3b: Secondary | ICD-10-CM

## 2020-10-04 DIAGNOSIS — W19XXXA Unspecified fall, initial encounter: Secondary | ICD-10-CM

## 2020-10-04 DIAGNOSIS — K7031 Alcoholic cirrhosis of liver with ascites: Secondary | ICD-10-CM | POA: Diagnosis not present

## 2020-10-04 DIAGNOSIS — R600 Localized edema: Secondary | ICD-10-CM | POA: Diagnosis not present

## 2020-10-04 DIAGNOSIS — Z794 Long term (current) use of insulin: Secondary | ICD-10-CM

## 2020-10-04 DIAGNOSIS — I482 Chronic atrial fibrillation, unspecified: Secondary | ICD-10-CM

## 2020-10-04 DIAGNOSIS — R42 Dizziness and giddiness: Secondary | ICD-10-CM | POA: Diagnosis not present

## 2020-10-04 DIAGNOSIS — R4189 Other symptoms and signs involving cognitive functions and awareness: Secondary | ICD-10-CM

## 2020-10-04 DIAGNOSIS — E039 Hypothyroidism, unspecified: Secondary | ICD-10-CM

## 2020-10-04 NOTE — Progress Notes (Signed)
Location: [place] Room Number: [id number] Place of Service:  ALF [phone])  Provider: Veleta Miners MD  Code Status: DNR Managed Care Goals of Care:  Advanced Directives 10/05/2020  Does Patient Have a Medical Advance Directive? Yes  Type of Advance Directive Living will;Out of facility DNR (pink MOST or yellow form)  Does patient want to make changes to medical advance directive? No - Patient declined  Copy of [place] in Chart? Yes - validated most recent copy scanned in chart (See row information)  Would patient like information on creating a medical advance directive? -  Pre-existing out of facility DNR order (yellow form or pink MOST form) Yellow form placed in chart (order not valid for inpatient use)     Chief Complaint  Patient presents with   Medical Management of Chronic Issues   Quality Metric Gaps    Shingrix, foot exam, urine microalbumin, eye exam    HPI: Patient is a 87 y.o. female seen today for an acute visit for LE edema with Discharge and Fall  Has h/o diabetes mellitus type 2 with neuropathy, history of alcoholic liver cirrhosis, Portal hypertensive gastropathy s/p EGD Diastolic CHF with Recurent Pleural Effusion and Iron Def Anemia Anemia and thrombocytopenia due to cirrhosis and splenomegaly History of hypertension and hypothyroidism   Recent admitted in the hospital from 7/18-7/25 for sepsis secondary to UTI  Patient recently moved back to [place]. Since then she has already have multiple Falls 2 since yesterday Her legs are swollen and now Leaking She gets Dizzy when stands up CBGs  are in good levels  Continues to have issues with changing her Pullups and taking showers Has gained 10lbs since she left SNF    Past Medical History:  Diagnosis Date   Anemia    Anemia of chronic renal failure, stage 3 (moderate) ([place]) 03/24/2020   Angiodysplasia of ascending colon 10/25/2014   Anxiety    Arthritis    Borderline  diabetes    Cancer of the skin, basal cell 09/03/2012   Cirrhosis ([place])    Diabetes mellitus without complication ([place])    Diverticular disease    GAVE (gastric antral vascular ectasia) 09/09/2017   GERD (gastroesophageal reflux disease)    Hypertension    Iron deficiency anemia due to chronic blood loss 01/19/2015   Portal hypertensive gastropathy ([place]) 08/02/2016   ? Some GAVE also   Thyroid disease     Past Surgical History:  Procedure Laterality Date   ABDOMINAL HYSTERECTOMY     APPENDECTOMY     COLONOSCOPY     ESOPHAGOGASTRODUODENOSCOPY     ESOPHAGOGASTRODUODENOSCOPY (EGD) WITH PROPOFOL N/A 02/22/2020   Procedure: ESOPHAGOGASTRODUODENOSCOPY (EGD) WITH PROPOFOL;  Surgeon: Gatha Mayer, MD;  [facility];  Service: Endoscopy;  Laterality: N/A;   IR PARACENTESIS  05/25/2016   IR THORACENTESIS ASP PLEURAL SPACE W/IMG GUIDE  02/23/2020   LEG SKIN LESION  BIOPSY / EXCISION  12/11/14   MOHS SURGERY     ankle   TONSILLECTOMY     TOTAL HIP ARTHROPLASTY Bilateral 1993, 2006   UPPER GASTROINTESTINAL ENDOSCOPY  09/09/2017    Allergies  Allergen Reactions   Tizanidine Other (See Comments)    Hallucinate, confused     Outpatient Encounter Medications as of 10/04/2020  Medication Sig   acetaminophen (TYLENOL) 325 MG tablet Take 650 mg by mouth every 6 (six) hours as needed.   Ascorbic Acid (VITAMIN C) 1000 MG tablet Take 1,000 mg by mouth  at bedtime.   folic acid (FOLVITE) 1 MG tablet Take 1 tablet (1 mg total) by mouth daily.   insulin aspart (NOVOLOG) 100 UNIT/ML injection Inject 10 Units into the skin 3 (three) times daily. CBG more then 100   insulin glargine (LANTUS) 100 UNIT/ML injection Inject 0.22 mLs (22 Units total) into the skin every morning.   insulin glargine (LANTUS) 100 UNIT/ML injection Inject 0.32 mLs (32 Units total) into the skin at bedtime.   lactulose (CHRONULAC) 10 GM/15ML solution Take 20 g by mouth daily.   latanoprost (XALATAN) 0.005 % ophthalmic  solution Place 1 drop into both eyes at bedtime.    levothyroxine (SYNTHROID) 75 MCG tablet Take 1 tablet (75 mcg total) by mouth daily.   LORazepam (ATIVAN) 0.5 MG tablet Take 1 tablet (0.5 mg total) by mouth at bedtime.   metFORMIN (GLUCOPHAGE-XR) 500 MG 24 hr tablet Take 1 tablet (500 mg total) by mouth daily.   metoprolol succinate (TOPROL-XL) 25 MG 24 hr tablet Take 12.5 mg by mouth daily.   pantoprazole (PROTONIX) 40 MG tablet Take 1 tablet (40 mg total) by mouth daily.   polyethylene glycol (MIRALAX / GLYCOLAX) 17 g packet Take 17 g by mouth 2 (two) times daily.   potassium chloride SA (KLOR-CON) 20 MEQ tablet Take 1 tablet (20 mEq total) by mouth daily.   senna-docusate (SENOKOT-S) 8.6-50 MG tablet Take 1 tablet by mouth 2 (two) times daily.   simvastatin (ZOCOR) 20 MG tablet TAKE ONE TABLET BY MOUTH EVERY NIGHT AT BEDTIME   sitaGLIPtin (JANUVIA) 50 MG tablet Take 50 mg by mouth daily.   spironolactone (ALDACTONE) 25 MG tablet Take 1 tablet (25 mg total) by mouth daily.   torsemide (DEMADEX) 20 MG tablet Take 20 mg by mouth 2 (two) times daily.   vitamin E 180 MG (400 UNITS) capsule Take 400 Units by mouth every evening.   [DISCONTINUED] torsemide (DEMADEX) 20 MG tablet Take 1 tablet (20 mg total) by mouth daily. (Patient taking differently: Take 20 mg by mouth 2 (two) times daily.)   [DISCONTINUED] nystatin (MYCOSTATIN/NYSTOP) powder Apply 1 application topically 2 (two) times daily.   No facility-administered encounter medications on file as of 10/04/2020.    Review of Systems:  Review of Systems  Constitutional:  Positive for unexpected weight change.  HENT: Negative.    Respiratory: Negative.    Cardiovascular:  Positive for leg swelling.  Gastrointestinal: Negative.   Genitourinary: Negative.   Musculoskeletal:  Positive for gait problem.  Skin: Negative.   Neurological:  Positive for dizziness and weakness.  Psychiatric/Behavioral:  Positive for confusion.    Health  Maintenance  Topic Date Due   Zoster Vaccines- Shingrix (1 of 2) Never done   FOOT EXAM  05/12/2015   URINE MICROALBUMIN  04/27/2020   OPHTHALMOLOGY EXAM  05/17/2020   INFLUENZA VACCINE  11/16/2023 (Originally 08/22/2020)   COVID-19 Vaccine (5 - Booster) 10/21/2020   HEMOGLOBIN A1C  02/09/2021   TETANUS/TDAP  11/07/2026   DEXA SCAN  Completed   PNA vac Low Risk Adult  Addressed   HPV VACCINES  Aged Out   COLONOSCOPY (Pts 45-15yr Insurance coverage will need to be confirmed)  Discontinued    Physical Exam: Vitals:   10/05/20 1344  BP: 100/60  Pulse: 94  Resp: 20  Temp: 99.2 F (37.3 C)  SpO2: 94%  Weight: 188 lb (85.3 kg)  Height: '5\' 7"'$  (1.702 m)   Body mass index is 29.44 kg/m. Physical Exam Vitals reviewed.  Constitutional:  Appearance: Normal appearance.  HENT:     Head: Normocephalic.     Nose: Nose normal.     Mouth/Throat:     Mouth: Mucous membranes are moist.     Pharynx: Oropharynx is clear.  Eyes:     Pupils: Pupils are equal, round, and reactive to light.  Cardiovascular:     Rate and Rhythm: Normal rate and regular rhythm.     Pulses: Normal pulses.  Pulmonary:     Effort: Pulmonary effort is normal. No respiratory distress.     Breath sounds: Normal breath sounds.  Abdominal:     General: Abdomen is flat. Bowel sounds are normal. There is no distension.  Musculoskeletal:     Cervical back: Neck supple.     Comments: Moderate Swelling Bilateral with Leaking from the Right foot. No Signs of Infection  Skin:    General: Skin is warm.  Neurological:     General: No focal deficit present.     Mental Status: She is alert.     Comments: C/o Dizziness when stands up. Able to walk with her walker  Psychiatric:        Mood and Affect: Mood normal.        Thought Content: Thought content normal.    Labs reviewed: Basic Metabolic Panel: Recent Labs    11/09/19 1137 11/14/19 1136 03/17/20 0000 03/24/20 1452 08/08/20 1750 08/09/20 0612  08/11/20 0442 08/12/20 0424 08/13/20 0410 08/14/20 0535 08/15/20 0421 08/23/20 0000 09/06/20 0000 09/09/20 0000 09/15/20 0824  NA 136   < > 139   < >  --    < > 134*   < > 133* 134* 134*   < > 143 138 136  K 3.6   < > 3.7   < >  --    < > 3.9   < > 3.8 3.6 3.8   < > 3.0* 3.5 4.1  CL 100   < > 103   < >  --    < > 102   < > 100 105 103   < > 103 103 100  CO2 25   < > 28*   < >  --    < > 24   < > '25 25 25   '$ < > 31* 24* 27  GLUCOSE 357*   < >  --    < >  --    < > 231*   < > 247* 168* 186*  --   --   --  241*  BUN 14   < > 26*   < >  --    < > 16   < > '13 14 15   '$ < > '16 18 15  '$ CREATININE 1.02*   < > 1.1   < >  --    < > 0.82   < > 0.82 0.83 0.97   < > 1.2* 1.3* 1.14*  CALCIUM 9.2   < > 9.1   < >  --    < > 8.7*   < > 8.7* 8.5* 8.3*   < > 8.7 8.5* 9.2  MG  --    < >  --   --   --    < > 2.0  --  1.8 1.9  --   --   --   --   --   TSH 2.65  --  2.27  --  0.895  --   --   --   --   --   --   --   --   --   --    < > =  values in this interval not displayed.   Liver Function Tests: Recent Labs    08/12/20 0424 08/13/20 0410 08/23/20 0000 08/30/20 0000 09/06/20 0000 09/15/20 0824  AST 143* 94*   < > 32 24 31  ALT 50* 43   < > '14 15 18  '$ ALKPHOS 98 91   < > 90 89 92  BILITOT 1.6* 1.3*  --   --   --  1.7*  PROT 5.2* 5.3*  --   --   --  5.8*  ALBUMIN 2.6* 2.6*   < > 2.7* 2.9* 3.1*   < > = values in this interval not displayed.   Recent Labs    08/08/20 1748  LIPASE 50   Recent Labs    01/16/20 1213 01/17/20 1048 08/08/20 1750  AMMONIA 71* 53* 31   CBC: Recent Labs    08/13/20 0410 08/14/20 0535 08/23/20 0000 09/15/20 0824  WBC 3.5* 4.4 3.4 5.4  NEUTROABS 2.3  --  2,213.00 3.8  HGB 9.7* 9.3* 9.2* 10.1*  HCT 29.5* 29.4* 29* 32.3*  MCV 92.2 93.9  --  88.7  PLT 50* 56* 73* 85*   Lipid Panel: Recent Labs    11/09/19 1137  CHOL 112  HDL 46*  LDLCALC 52  TRIG 61  CHOLHDL 2.4   Lab Results  Component Value Date   HGBA1C 6.4 (H) 08/09/2020    Procedures since  last visit: No results found.  Assessment/Plan Bilateral leg edema with Discharge Increase her Demadex to 20 mg BID for 3 days and then 20 mg in AM and 10 mg in pm Unfortunately patient gets dizzy when we increase her Diuretics Also try Elevation and Ted hoses  Chronic diastolic (congestive) heart failure (HCC) Increasing her Demadex  Alcoholic cirrhosis of liver with ascites ([place]) On Aldactone Lactulose Dizziness Most likely due to her Hypotension Combination of Aldactone, Diuretic  Also metoprolol for her tachycardia  Fall, initial encounter Working with therapy and Reinforcing Walker  Cognitive impairment Supportive care Will forget her walker and then falls. Also Indulges in sweets from the kitchen leading to high CBGs  Anemia in stage 3b chronic kidney disease ([place]) Follows with Hematology for Iron and Transfusion as needed Adult hypothyroidism TSH normal in 7/22 Type 2 diabetes mellitus  (HCC) BS are better on Insulin Januvia and Metformin  Stage 3b chronic kidney disease ([place]) Creat stable  ACP I have d/w Nurse she will probably need Higher level of care again Possible Hospice as family doe not want too aggressive management   Labs/tests ordered:  * No order type specified * Next appt:  Visit date not found

## 2020-10-05 ENCOUNTER — Encounter: Payer: Self-pay | Admitting: Internal Medicine

## 2020-10-13 ENCOUNTER — Inpatient Hospital Stay: Payer: HMO

## 2020-10-14 ENCOUNTER — Non-Acute Institutional Stay: Payer: Medicare Other | Admitting: Orthopedic Surgery

## 2020-10-14 DIAGNOSIS — R42 Dizziness and giddiness: Secondary | ICD-10-CM

## 2020-10-14 DIAGNOSIS — I5032 Chronic diastolic (congestive) heart failure: Secondary | ICD-10-CM | POA: Diagnosis not present

## 2020-10-14 DIAGNOSIS — L03115 Cellulitis of right lower limb: Secondary | ICD-10-CM

## 2020-10-14 DIAGNOSIS — E039 Hypothyroidism, unspecified: Secondary | ICD-10-CM

## 2020-10-14 DIAGNOSIS — E1165 Type 2 diabetes mellitus with hyperglycemia: Secondary | ICD-10-CM

## 2020-10-14 DIAGNOSIS — N1832 Chronic kidney disease, stage 3b: Secondary | ICD-10-CM

## 2020-10-14 DIAGNOSIS — D631 Anemia in chronic kidney disease: Secondary | ICD-10-CM

## 2020-10-14 DIAGNOSIS — R4189 Other symptoms and signs involving cognitive functions and awareness: Secondary | ICD-10-CM

## 2020-10-14 DIAGNOSIS — Z794 Long term (current) use of insulin: Secondary | ICD-10-CM

## 2020-10-14 DIAGNOSIS — K7031 Alcoholic cirrhosis of liver with ascites: Secondary | ICD-10-CM

## 2020-10-15 ENCOUNTER — Encounter: Payer: Self-pay | Admitting: Orthopedic Surgery

## 2020-10-15 MED ORDER — DOXYCYCLINE HYCLATE 100 MG PO TABS: 100.0000 mg | ORAL_TABLET | Freq: Two times a day (BID) | ORAL | 0 refills | Status: AC

## 2020-10-15 NOTE — Progress Notes (Signed)
Location:  [facility] Room Number: [place]:  ALF [phone]) Provider:  Windell Moulding, AGNP-C  Virgie Dad, MD  Patient Care Team: Virgie Dad, MD as PCP - General (Internal Medicine) Berniece Salines, DO as PCP - Cardiology (Cardiology) Sheryn Bison, MD as Referring Physician (Dermatology) Gatha Mayer, MD as Consulting Physician (Gastroenterology) Marin Olp Rudell Cobb, MD as Consulting Physician (Oncology) Janan Ridge, MD as Consulting Physician (Dermatology) Paralee Cancel, MD as Consulting Physician (Orthopedic Surgery) Jacelyn Pi, MD as Consulting Physician (Endocrinology) Day, Melvenia Beam, [facility] (Inactive) as Pharmacist (Pharmacist) Berniece Salines, DO as Consulting Physician (Cardiology)  Extended Emergency Contact Information Primary Emergency Contact: Trinidad Curet [place] Phone: [phone] Mobile Phone: [phone] Relation: Son Secondary Emergency Contact: Kathlene, [place] Mobile Phone: [phone] Relation: Relative Preferred language: English Interpreter needed? No  Code Status:  DNR Goals of care: Advanced Directive information Advanced Directives 10/05/2020  Does Patient Have a Medical Advance Directive? Yes  Type of Advance Directive Living will;Out of facility DNR (pink MOST or yellow form)  Does patient want to make changes to medical advance directive? No - Patient declined  Copy of [place] in Chart? Yes - validated most recent copy scanned in chart (See row information)  Would patient like information on creating a medical advance directive? -  Pre-existing out of facility DNR order (yellow form or pink MOST form) Yellow form placed in chart (order not valid for inpatient use)     Chief Complaint  Patient presents with   Acute Visit    HPI:  Pt is a 87 y.o. female seen today for acute visit due to right foot pain.   She currently resides on the assisted living unit at [facility], [place]. Past medical history includes: afib, diastolic heart failure, HTN, cirrhosis of liver, GERD, pancreatic cyst, constipation, hypothyroidism, HLD, anemia, cognitive impairment, and anxiety.   09/13 seen by Dr. Lyndel Safe, BLE edema and right foot weeping noted. Torsemide increased to 40 mg x 3 days. She has fallen twice within the past week, no apparent injury. She woke up this morning complaining of right foot pain to nursing staff. Right foot red, swollen and tender. Small area of skin breakdown with weeping drainage. Toenail to 5th toe loose. She is able to bear weight on her leg without increased pain. Right foot pain rated 3/10, described as sore. Remains afebrile, vitals stable.   CHF- no weight fluctuations or sob, ankle edema noted, torsemide daily Alcoholic cirrhosis of liver- followed by hospice, denies pain, remains on aldactone and lactulose Dizziness- 2 falls within past week, remains on diuretics and metoprolol Cognitive impairment- no recent behavioral outbursts, snacks often, ativan q hs Anemia- hgb 10.1 09/15/2020 T2DM- a1c 6.4 07/2020, blood sugars averaging 200-400's due to snacking, remains on Lantus, SSI, Januvia and metformin Hypothyroidism- TSH 0.895 07/2020, levothyroxine  Recent blood pressures:  09/21- 110/68  09/20- 110/68  09/17- 112/62   Past Medical History:  Diagnosis Date   Anemia    Anemia of chronic renal failure, stage 3 (moderate) ([place]) 03/24/2020   Angiodysplasia of ascending colon 10/25/2014   Anxiety    Arthritis    Borderline diabetes    Cancer of the skin, basal cell 09/03/2012   Cirrhosis ([place])    Diabetes mellitus without complication ([place])    Diverticular disease    GAVE (gastric antral vascular ectasia) 09/09/2017   GERD (gastroesophageal reflux disease)    Hypertension    Iron  deficiency anemia due to chronic blood loss 01/19/2015   Portal hypertensive gastropathy ([place]) 08/02/2016   ? Some GAVE also   Thyroid disease    Past  Surgical History:  Procedure Laterality Date   ABDOMINAL HYSTERECTOMY     APPENDECTOMY     COLONOSCOPY     ESOPHAGOGASTRODUODENOSCOPY     ESOPHAGOGASTRODUODENOSCOPY (EGD) WITH PROPOFOL N/A 02/22/2020   Procedure: ESOPHAGOGASTRODUODENOSCOPY (EGD) WITH PROPOFOL;  Surgeon: Gatha Mayer, MD;  Location: [facility];  Service: Endoscopy;  Laterality: N/A;   IR PARACENTESIS  05/25/2016   IR THORACENTESIS ASP PLEURAL SPACE W/IMG GUIDE  02/23/2020   LEG SKIN LESION  BIOPSY / EXCISION  12/11/14   MOHS SURGERY     ankle   TONSILLECTOMY     TOTAL HIP ARTHROPLASTY Bilateral 1993, 2006   UPPER GASTROINTESTINAL ENDOSCOPY  09/09/2017    Allergies  Allergen Reactions   Tizanidine Other (See Comments)    Hallucinate, confused     Outpatient Encounter Medications as of 10/14/2020  Medication Sig   acetaminophen (TYLENOL) 325 MG tablet Take 650 mg by mouth every 6 (six) hours as needed.   Ascorbic Acid (VITAMIN C) 1000 MG tablet Take 1,000 mg by mouth at bedtime.   folic acid (FOLVITE) 1 MG tablet Take 1 tablet (1 mg total) by mouth daily.   insulin aspart (NOVOLOG) 100 UNIT/ML injection Inject 10 Units into the skin 3 (three) times daily. CBG more then 100   insulin glargine (LANTUS) 100 UNIT/ML injection Inject 0.22 mLs (22 Units total) into the skin every morning.   insulin glargine (LANTUS) 100 UNIT/ML injection Inject 0.32 mLs (32 Units total) into the skin at bedtime.   lactulose (CHRONULAC) 10 GM/15ML solution Take 20 g by mouth daily.   latanoprost (XALATAN) 0.005 % ophthalmic solution Place 1 drop into both eyes at bedtime.    levothyroxine (SYNTHROID) 75 MCG tablet Take 1 tablet (75 mcg total) by mouth daily.   LORazepam (ATIVAN) 0.5 MG tablet Take 1 tablet (0.5 mg total) by mouth at bedtime.   metFORMIN (GLUCOPHAGE-XR) 500 MG 24 hr tablet Take 1 tablet (500 mg total) by mouth daily.   metoprolol succinate (TOPROL-XL) 25 MG 24 hr tablet Take 12.5 mg by mouth daily.   pantoprazole (PROTONIX)  40 MG tablet Take 1 tablet (40 mg total) by mouth daily.   polyethylene glycol (MIRALAX / GLYCOLAX) 17 g packet Take 17 g by mouth 2 (two) times daily.   potassium chloride SA (KLOR-CON) 20 MEQ tablet Take 1 tablet (20 mEq total) by mouth daily.   senna-docusate (SENOKOT-S) 8.6-50 MG tablet Take 1 tablet by mouth 2 (two) times daily.   simvastatin (ZOCOR) 20 MG tablet TAKE ONE TABLET BY MOUTH EVERY NIGHT AT BEDTIME   sitaGLIPtin (JANUVIA) 50 MG tablet Take 50 mg by mouth daily.   spironolactone (ALDACTONE) 25 MG tablet Take 1 tablet (25 mg total) by mouth daily.   torsemide (DEMADEX) 20 MG tablet Take 20 mg by mouth 2 (two) times daily.   vitamin E 180 MG (400 UNITS) capsule Take 400 Units by mouth every evening.   No facility-administered encounter medications on file as of 10/14/2020.    Review of Systems  Unable to perform ROS: Dementia   Immunization History  Administered Date(s) Administered   Fluad Quad(high Dose 65+) 10/27/2018, 02/09/2020   Hepatitis B, adult 05/12/2014   Hepatitis B, ped/adol 08/10/2014   Influenza Split 11/02/2009, 10/15/2010   Influenza, High Dose Seasonal PF 10/13/2014, 01/26/2017   Influenza,  Quadrivalent, Recombinant, Inj, Pf 09/30/2017   Influenza, Seasonal, Injecte, Preservative Fre 10/13/2014   Influenza,inj,quad, With Preservative 11/22/2016   Influenza-Unspecified 11/25/2013, 10/06/2015, 10/22/2017   Moderna Sars-Covid-2 Vaccination 03/26/2019, 04/23/2019, 06/21/2020   PFIZER(Purple Top)SARS-COV-2 Vaccination 11/21/2019   Pneumococcal Conjugate-13 05/17/2014   Pneumococcal Polysaccharide-23 10/06/2015   Tdap 10/02/2011, 11/06/2016   Zoster, Live 01/22/2005   Pertinent  Health Maintenance Due  Topic Date Due   FOOT EXAM  05/12/2015   URINE MICROALBUMIN  04/27/2020   OPHTHALMOLOGY EXAM  05/17/2020   INFLUENZA VACCINE  11/16/2023 (Originally 08/22/2020)   HEMOGLOBIN A1C  02/09/2021   DEXA SCAN  Completed   COLONOSCOPY (Pts 45-29yrs Insurance  coverage will need to be confirmed)  Discontinued   Fall Risk  04/28/2019 01/09/2018 10/26/2016 10/09/2016 11/25/2015  Falls in the past year? 1 0 Yes No Yes  Number falls in past yr: 0 - 2 or more - 2 or more  Injury with Fall? 0 - No - Yes  Comment - - - - -  Risk Factor Category  - - High Fall Risk - High Fall Risk  Risk for fall due to : - - Impaired balance/gait - -  Follow up Falls evaluation completed - - - Falls prevention discussed   Functional Status Survey:    Vitals:   10/15/20 1549  BP: 110/68  Pulse: 88  Resp: 18  Temp: 98.5 F (36.9 C)  SpO2: 100%  Weight: 186 lb 9.6 oz (84.6 kg)   Body mass index is 29.23 kg/m. Physical Exam Vitals reviewed.  Constitutional:      General: She is not in acute distress. HENT:     Head: Normocephalic.  Eyes:     General:        Right eye: No discharge.        Left eye: No discharge.  Cardiovascular:     Rate and Rhythm: Normal rate and regular rhythm.     Pulses: Normal pulses.     Heart sounds: Normal heart sounds. No murmur heard. Pulmonary:     Effort: Pulmonary effort is normal. No respiratory distress.     Breath sounds: Normal breath sounds. No wheezing.  Abdominal:     General: Bowel sounds are normal. There is no distension.     Palpations: Abdomen is soft.     Tenderness: There is no abdominal tenderness.  Musculoskeletal:     Right lower leg: Edema present.     Left lower leg: Edema present.     Comments: Erythema, tenderness and swelling over right dorsal foot. 2 cm skin crack with serosanguinous drainage. Dorsal pedis 1+. FROM. Non-pitting edema to BLE.   Skin:    General: Skin is warm and dry.     Capillary Refill: Capillary refill takes less than 2 seconds.  Neurological:     General: No focal deficit present.     Mental Status: She is alert. Mental status is at baseline.     Motor: Weakness present.     Gait: Gait abnormal.  Psychiatric:        Mood and Affect: Mood normal.        Behavior: Behavior  normal.        Cognition and Memory: Memory is impaired.     Comments: Very pleasant, follows commands.     Labs reviewed: Recent Labs    08/11/20 0442 08/12/20 0424 08/13/20 0410 08/14/20 0535 08/15/20 0421 08/23/20 0000 09/06/20 0000 09/09/20 0000 09/15/20 0824  NA 134*   < >  133* 134* 134*   < > 143 138 136  K 3.9   < > 3.8 3.6 3.8   < > 3.0* 3.5 4.1  CL 102   < > 100 105 103   < > 103 103 100  CO2 24   < > 25 25 25    < > 31* 24* 27  GLUCOSE 231*   < > 247* 168* 186*  --   --   --  241*  BUN 16   < > 13 14 15    < > 16 18 15   CREATININE 0.82   < > 0.82 0.83 0.97   < > 1.2* 1.3* 1.14*  CALCIUM 8.7*   < > 8.7* 8.5* 8.3*   < > 8.7 8.5* 9.2  MG 2.0  --  1.8 1.9  --   --   --   --   --    < > = values in this interval not displayed.   Recent Labs    08/12/20 0424 08/13/20 0410 08/23/20 0000 08/30/20 0000 09/06/20 0000 09/15/20 0824  AST 143* 94*   < > 32 24 31  ALT 50* 43   < > 14 15 18   ALKPHOS 98 91   < > 90 89 92  BILITOT 1.6* 1.3*  --   --   --  1.7*  PROT 5.2* 5.3*  --   --   --  5.8*  ALBUMIN 2.6* 2.6*   < > 2.7* 2.9* 3.1*   < > = values in this interval not displayed.   Recent Labs    08/13/20 0410 08/14/20 0535 08/23/20 0000 09/15/20 0824  WBC 3.5* 4.4 3.4 5.4  NEUTROABS 2.3  --  2,213.00 3.8  HGB 9.7* 9.3* 9.2* 10.1*  HCT 29.5* 29.4* 29* 32.3*  MCV 92.2 93.9  --  88.7  PLT 50* 56* 73* 85*   Lab Results  Component Value Date   TSH 0.895 08/08/2020   Lab Results  Component Value Date   HGBA1C 6.4 (H) 08/09/2020   Lab Results  Component Value Date   CHOL 112 11/09/2019   HDL 46 (L) 11/09/2019   LDLCALC 52 11/09/2019   TRIG 61 11/09/2019   CHOLHDL 2.4 11/09/2019    Significant Diagnostic Results in last 30 days:  No results found.  Assessment/Plan 1. Cellulitis of right foot - erythema, swelling and tenderness over dorsal foot - rocephin 1g IM once - doxycycline 100 mg po bid x 14 days  2. Chronic diastolic (congestive) heart  failure (HCC) - no weight fluctuations or sob, BLE edema noted - cont torsemide  3. Alcoholic cirrhosis of liver with ascites ([place]) - followed by hospice - cont aldactone and lactulose  4. Dizziness - d/t hypotension  - cont falls safety plan  5. Cognitive impairment - poor safety awareness - cont supportive care  6. Anemia in stage 3b chronic kidney disease ([place]) - followed by hematology for prn iron transfusions  7. Type 2 diabetes mellitus with hyperglycemia, with long-term current use of insulin (HCC) - a1c 6.4 07/2020 - blood sugars averaging 200-400's - snacks often - cont Lantus, SSI, Januvia and metformin  8. Adult hypothyroidism - TSH 0.8 07/2020 - cont levothyroxine    Family/ staff Communication: plan discussed with patient and nurse  Labs/tests ordered:  none

## 2020-10-19 ENCOUNTER — Other Ambulatory Visit: Payer: Self-pay | Admitting: Orthopedic Surgery

## 2020-10-19 DIAGNOSIS — R4189 Other symptoms and signs involving cognitive functions and awareness: Secondary | ICD-10-CM

## 2020-10-19 MED ORDER — LORAZEPAM 0.5 MG PO TABS: 0.5000 mg | ORAL_TABLET | Freq: Every day | ORAL | 0 refills | Status: DC

## 2020-10-20 ENCOUNTER — Ambulatory Visit: Payer: HMO

## 2020-11-02 ENCOUNTER — Non-Acute Institutional Stay: Payer: Medicare Other | Admitting: Orthopedic Surgery

## 2020-11-02 ENCOUNTER — Encounter: Payer: Self-pay | Admitting: Orthopedic Surgery

## 2020-11-02 DIAGNOSIS — Z794 Long term (current) use of insulin: Secondary | ICD-10-CM

## 2020-11-02 DIAGNOSIS — R600 Localized edema: Secondary | ICD-10-CM

## 2020-11-02 DIAGNOSIS — I5032 Chronic diastolic (congestive) heart failure: Secondary | ICD-10-CM | POA: Diagnosis not present

## 2020-11-02 DIAGNOSIS — R4189 Other symptoms and signs involving cognitive functions and awareness: Secondary | ICD-10-CM

## 2020-11-02 DIAGNOSIS — Z79899 Other long term (current) drug therapy: Secondary | ICD-10-CM

## 2020-11-02 DIAGNOSIS — N1832 Chronic kidney disease, stage 3b: Secondary | ICD-10-CM

## 2020-11-02 DIAGNOSIS — E039 Hypothyroidism, unspecified: Secondary | ICD-10-CM

## 2020-11-02 DIAGNOSIS — E1165 Type 2 diabetes mellitus with hyperglycemia: Secondary | ICD-10-CM | POA: Diagnosis not present

## 2020-11-02 DIAGNOSIS — K7031 Alcoholic cirrhosis of liver with ascites: Secondary | ICD-10-CM

## 2020-11-02 DIAGNOSIS — D631 Anemia in chronic kidney disease: Secondary | ICD-10-CM

## 2020-11-02 DIAGNOSIS — L03115 Cellulitis of right lower limb: Secondary | ICD-10-CM

## 2020-11-02 NOTE — Progress Notes (Signed)
Location:   [facility] Room Number: [place] of Service:  ALF [phone]) Provider: Windell Moulding, NP   Jessica Dad, MD  Patient Care Team: Jessica Dad, MD as PCP - General (Internal Medicine) Berniece Salines, DO as PCP - Cardiology (Cardiology) Sheryn Bison, MD as Referring Physician (Dermatology) Gatha Mayer, MD as Consulting Physician (Gastroenterology) Marin Olp Rudell Cobb, MD as Consulting Physician (Oncology) Janan Ridge, MD as Consulting Physician (Dermatology) Paralee Cancel, MD as Consulting Physician (Orthopedic Surgery) Jacelyn Pi, MD as Consulting Physician (Endocrinology) Day, Melvenia Beam, [facility] (Inactive) as Pharmacist (Pharmacist) Berniece Salines, DO as Consulting Physician (Cardiology)  Extended Emergency Contact Information Primary Emergency Contact: Trinidad Curet [place] Phone: [phone] Mobile Phone: [phone] Relation: Son Secondary Emergency Contact: Alydia, [place] Mobile Phone: [phone] Relation: Relative Preferred language: English Interpreter needed? No  Code Status:  DNR Goals of care: Advanced Directive information Advanced Directives 10/05/2020  Does Patient Have a Medical Advance Directive? Yes  Type of Advance Directive Living will;Out of facility DNR (pink MOST or yellow form)  Does patient want to make changes to medical advance directive? No - Patient declined  Copy of [place] in Chart? Yes - validated most recent copy scanned in chart (See row information)  Would patient like information on creating a medical advance directive? -  Pre-existing out of facility DNR order (yellow form or pink MOST form) Yellow form placed in chart (order not valid for inpatient use)     Chief Complaint  Patient presents with   Acute Visit    Increased bilateral lower leg edema    HPI:  Pt is a 87 y.o. female seen today for an acute visit for bilateral leg edema.   She currently  resides on the assisted living unit at [facility]. Past medical history includes: afib, diastolic heart failure, HTN, cirrhosis of liver, GERD, pancreatic cyst, constipation, hypothyroidism, HLD, anemia, cognitive impairment, and anxiety.    09/23 she was diagnosed with cellulitis of the right foot and given doxycycline. Right foot without issues today, she denies right foot pain. Today, hospice nurse reports increased swelling to lower extremities. She denies pain to lower legs, reports some increased difficulty walking lately. Currently on torsemide and spironolactone.  Ambulates with walker, no recent falls. She remains on carb mod diet. No sodium restrictions.   In addition, hospice nurse requesting medication review per family request.  Reports family interested in eliminating diabetic medications. Blood sugars ranging from 200-400 at this time. Remains on Lantus, metformin, januvia and SSI. No recent hypoglycemic events. A1c 6.4 07/2020. Attempts to call daughter made, no answer.   CHF- no weight fluctuations or sob, increased ankle edema noted, torsemide daily Alcoholic cirrhosis of liver- followed by hospice, denies pain, remains on aldactone and lactulose, continues to do well in AL Cognitive impairment- no recent behavioral outbursts, snacks often, ativan q hs Anemia- hgb 10.1 09/15/2020 Hypothyroidism- TSH 0.895 07/2020, levothyroxine   Past Medical History:  Diagnosis Date   Anemia    Anemia of chronic renal failure, stage 3 (moderate) ([place]) 03/24/2020   Angiodysplasia of ascending colon 10/25/2014   Anxiety    Arthritis    Borderline diabetes    Cancer of the skin, basal cell 09/03/2012   Cirrhosis ([place])    Diabetes mellitus without complication ([place])    Diverticular disease    GAVE (gastric antral vascular ectasia) 09/09/2017   GERD (gastroesophageal reflux disease)    Hypertension  Iron deficiency anemia due to chronic blood loss 01/19/2015   Portal hypertensive  gastropathy ([place]) 08/02/2016   ? Some GAVE also   Thyroid disease    Past Surgical History:  Procedure Laterality Date   ABDOMINAL HYSTERECTOMY     APPENDECTOMY     COLONOSCOPY     ESOPHAGOGASTRODUODENOSCOPY     ESOPHAGOGASTRODUODENOSCOPY (EGD) WITH PROPOFOL N/A 02/22/2020   Procedure: ESOPHAGOGASTRODUODENOSCOPY (EGD) WITH PROPOFOL;  Surgeon: Gatha Mayer, MD;  Location: [facility];  Service: Endoscopy;  Laterality: N/A;   IR PARACENTESIS  05/25/2016   IR THORACENTESIS ASP PLEURAL SPACE W/IMG GUIDE  02/23/2020   LEG SKIN LESION  BIOPSY / EXCISION  12/11/14   MOHS SURGERY     ankle   TONSILLECTOMY     TOTAL HIP ARTHROPLASTY Bilateral 1993, 2006   UPPER GASTROINTESTINAL ENDOSCOPY  09/09/2017    Allergies  Allergen Reactions   Tizanidine Other (See Comments)    Hallucinate, confused     Allergies as of 11/02/2020       Reactions   Tizanidine Other (See Comments)   Hallucinate, confused         Medication List        Accurate as of November 02, 2020  3:28 PM. If you have any questions, ask your nurse or doctor.          acetaminophen 325 MG tablet Commonly known as: TYLENOL Take 650 mg by mouth every 6 (six) hours as needed.   folic acid 1 MG tablet Commonly known as: FOLVITE Take 1 tablet (1 mg total) by mouth daily.   insulin aspart 100 UNIT/ML injection Commonly known as: novoLOG Inject 10 Units into the skin 3 (three) times daily. CBG more then 100   insulin glargine 100 UNIT/ML injection Commonly known as: LANTUS Inject 0.22 mLs (22 Units total) into the skin every morning.   insulin glargine 100 UNIT/ML injection Commonly known as: LANTUS Inject 0.32 mLs (32 Units total) into the skin at bedtime.   lactulose 10 GM/15ML solution Commonly known as: CHRONULAC Take 20 g by mouth daily.   latanoprost 0.005 % ophthalmic solution Commonly known as: XALATAN Place 1 drop into both eyes at bedtime.   levothyroxine 75 MCG tablet Commonly known as:  SYNTHROID Take 1 tablet (75 mcg total) by mouth daily.   LORazepam 0.5 MG tablet Commonly known as: ATIVAN Take 1 tablet (0.5 mg total) by mouth at bedtime.   metFORMIN 500 MG 24 hr tablet Commonly known as: GLUCOPHAGE-XR Take 1 tablet (500 mg total) by mouth daily.   metoprolol succinate 25 MG 24 hr tablet Commonly known as: TOPROL-XL Take 12.5 mg by mouth daily.   pantoprazole 40 MG tablet Commonly known as: PROTONIX Take 1 tablet (40 mg total) by mouth daily.   polyethylene glycol 17 g packet Commonly known as: MIRALAX / GLYCOLAX Take 17 g by mouth 2 (two) times daily.   potassium chloride SA 20 MEQ tablet Commonly known as: KLOR-CON Take 1 tablet (20 mEq total) by mouth daily.   senna-docusate 8.6-50 MG tablet Commonly known as: Senokot-S Take 1 tablet by mouth 2 (two) times daily.   simvastatin 20 MG tablet Commonly known as: ZOCOR TAKE ONE TABLET BY MOUTH EVERY NIGHT AT BEDTIME   sitaGLIPtin 50 MG tablet Commonly known as: JANUVIA Take 50 mg by mouth daily.   spironolactone 25 MG tablet Commonly known as: Aldactone Take 1 tablet (25 mg total) by mouth daily.   torsemide 20 MG tablet Commonly known  as: DEMADEX Take 20 mg by mouth 2 (two) times daily.   vitamin C 1000 MG tablet Take 1,000 mg by mouth at bedtime.   vitamin E 180 MG (400 UNITS) capsule Take 400 Units by mouth every evening.        Review of Systems  Constitutional:  Negative for activity change, appetite change, chills, fatigue and fever.  HENT: Negative.    Eyes: Negative.   Respiratory:  Negative for cough, shortness of breath and wheezing.   Cardiovascular:  Positive for leg swelling. Negative for chest pain.  Gastrointestinal:  Negative for abdominal distention, abdominal pain, constipation, diarrhea and nausea.  Genitourinary:  Negative for dysuria, frequency and hematuria.  Musculoskeletal:  Positive for gait problem. Negative for arthralgias and myalgias.  Skin: Negative.    Neurological:  Positive for dizziness and weakness. Negative for light-headedness and headaches.  Psychiatric/Behavioral:  Positive for confusion. Negative for dysphoric mood and sleep disturbance. The patient is not nervous/anxious.    Immunization History  Administered Date(s) Administered   Fluad Quad(high Dose 65+) 10/27/2018, 02/09/2020   Hepatitis B, adult 05/12/2014   Hepatitis B, ped/adol 08/10/2014   Influenza Split 11/02/2009, 10/15/2010   Influenza, High Dose Seasonal PF 10/13/2014, 01/26/2017   Influenza, Quadrivalent, Recombinant, Inj, Pf 09/30/2017   Influenza, Seasonal, Injecte, Preservative Fre 10/13/2014   Influenza,inj,quad, With Preservative 11/22/2016   Influenza-Unspecified 11/25/2013, 10/06/2015, 10/22/2017   Moderna Sars-Covid-2 Vaccination 03/26/2019, 04/23/2019, 06/21/2020   PFIZER(Purple Top)SARS-COV-2 Vaccination 11/21/2019   Pneumococcal Conjugate-13 05/17/2014   Pneumococcal Polysaccharide-23 10/06/2015   Tdap 10/02/2011, 11/06/2016   Zoster, Live 01/22/2005   Pertinent  Health Maintenance Due  Topic Date Due   FOOT EXAM  05/12/2015   URINE MICROALBUMIN  04/27/2020   OPHTHALMOLOGY EXAM  05/17/2020   INFLUENZA VACCINE  11/16/2023 (Originally 08/22/2020)   HEMOGLOBIN A1C  02/09/2021   DEXA SCAN  Completed   COLONOSCOPY (Pts 45-21yrs Insurance coverage will need to be confirmed)  Discontinued   Fall Risk  04/28/2019 01/09/2018 10/26/2016 10/09/2016 11/25/2015  Falls in the past year? 1 0 Yes No Yes  Number falls in past yr: 0 - 2 or more - 2 or more  Injury with Fall? 0 - No - Yes  Comment - - - - -  Risk Factor Category  - - High Fall Risk - High Fall Risk  Risk for fall due to : - - Impaired balance/gait - -  Follow up Falls evaluation completed - - - Falls prevention discussed   Functional Status Survey:    Vitals:   11/02/20 1523  BP: 98/60  Pulse: 76  Resp: 20  Temp: 98.9 F (37.2 C)  SpO2: 98%  Weight: 189 lb 8 oz (86 kg)  Height: 5\' 7"   (1.702 m)   Body mass index is 29.68 kg/m. Physical Exam Vitals reviewed.  Constitutional:      General: She is not in acute distress. HENT:     Head: Normocephalic.  Eyes:     General:        Right eye: No discharge.        Left eye: No discharge.  Neck:     Vascular: No carotid bruit.  Cardiovascular:     Rate and Rhythm: Normal rate. Rhythm irregular.     Pulses: Normal pulses.     Heart sounds: Normal heart sounds. No murmur heard. Pulmonary:     Effort: Pulmonary effort is normal. No respiratory distress.     Breath sounds: Normal breath sounds. No wheezing.  Abdominal:     General: Bowel sounds are normal. There is no distension.     Palpations: Abdomen is soft.     Tenderness: There is no abdominal tenderness.  Musculoskeletal:     Cervical back: Normal range of motion.     Right lower leg: Edema present.     Left lower leg: Edema present.     Comments: 2+ pitting edema to BLE, no sores to lower extremities, some dry skin present, cool to touch, full range of motion, cap refill < 3 sec, dorsal pedis 1+, right and left dorsiflexion 4/5.   Lymphadenopathy:     Cervical: No cervical adenopathy.  Skin:    General: Skin is warm and dry.     Capillary Refill: Capillary refill takes less than 2 seconds.  Neurological:     General: No focal deficit present.     Mental Status: She is alert. Mental status is at baseline.     Motor: Weakness present.     Gait: Gait abnormal.  Psychiatric:        Mood and Affect: Mood normal.        Behavior: Behavior normal.        Cognition and Memory: Memory is impaired.    Labs reviewed: Recent Labs    08/11/20 0442 08/12/20 0424 08/13/20 0410 08/14/20 0535 08/15/20 0421 08/23/20 0000 09/06/20 0000 09/09/20 0000 09/15/20 0824  NA 134*   < > 133* 134* 134*   < > 143 138 136  K 3.9   < > 3.8 3.6 3.8   < > 3.0* 3.5 4.1  CL 102   < > 100 105 103   < > 103 103 100  CO2 24   < > 25 25 25    < > 31* 24* 27  GLUCOSE 231*   < >  247* 168* 186*  --   --   --  241*  BUN 16   < > 13 14 15    < > 16 18 15   CREATININE 0.82   < > 0.82 0.83 0.97   < > 1.2* 1.3* 1.14*  CALCIUM 8.7*   < > 8.7* 8.5* 8.3*   < > 8.7 8.5* 9.2  MG 2.0  --  1.8 1.9  --   --   --   --   --    < > = values in this interval not displayed.   Recent Labs    08/12/20 0424 08/13/20 0410 08/23/20 0000 08/30/20 0000 09/06/20 0000 09/15/20 0824  AST 143* 94*   < > 32 24 31  ALT 50* 43   < > 14 15 18   ALKPHOS 98 91   < > 90 89 92  BILITOT 1.6* 1.3*  --   --   --  1.7*  PROT 5.2* 5.3*  --   --   --  5.8*  ALBUMIN 2.6* 2.6*   < > 2.7* 2.9* 3.1*   < > = values in this interval not displayed.   Recent Labs    08/13/20 0410 08/14/20 0535 08/23/20 0000 09/15/20 0824  WBC 3.5* 4.4 3.4 5.4  NEUTROABS 2.3  --  2,213.00 3.8  HGB 9.7* 9.3* 9.2* 10.1*  HCT 29.5* 29.4* 29* 32.3*  MCV 92.2 93.9  --  88.7  PLT 50* 56* 73* 85*   Lab Results  Component Value Date   TSH 0.895 08/08/2020   Lab Results  Component Value Date   HGBA1C 6.4 (H) 08/09/2020  Lab Results  Component Value Date   CHOL 112 11/09/2019   HDL 46 (L) 11/09/2019   LDLCALC 52 11/09/2019   TRIG 61 11/09/2019   CHOLHDL 2.4 11/09/2019    Significant Diagnostic Results in last 30 days:  No results found.  Assessment/Plan 1. Bilateral leg edema - ongoing, unable to increase diuretic due to intermittent dizziness - 2+ pitting edema to BLE, no weeping today - will give extra dose torsemide 20 mg QAM x 2 days - recommend NAS/ carb mod diet - recommend leg elevation 2-4 hours daily  2. Encounter for medication review - enrolled in hospice due to advanced alcoholic cirrhosis - do not recommend reduction in medications at this time - attempted to discuss with daughter and was unable to reach her  3. Type 2 diabetes mellitus with hyperglycemia, with long-term current use of insulin (HCC) - uncontrolled, snacking more - blood glucose averaging 200-400 - no hypoglycemic  events - cont metformin, Lantus, SSI and Januvia - advised to limit snacks- forgets due to memory impairment  4. Chronic diastolic (congestive) heart failure (HCC) - denies sob, no weight fluctuations, BLE ongoing - cont torsemide  5. Alcoholic cirrhosis of liver with ascites ([place]) - followed by hospice - cont aldactone and lactulose  6. Cognitive impairment - poor safety awareness  7. Anemia in stage 3b chronic kidney disease ([place]) - followed by hematology for iron transfusions prn  8. Adult hypothyroidism - cont levothyroxine  9. Cellulitis of right foot - treated with doxycycline x 14 days - improved, RLE with no weeping, skin cool to touch, no redness    Family/ staff Communication: plan discussed with patient and nurse  Labs/tests ordered:   none

## 2020-11-04 ENCOUNTER — Encounter: Payer: Self-pay | Admitting: Orthopedic Surgery

## 2020-11-05 ENCOUNTER — Encounter: Payer: Self-pay | Admitting: Family

## 2020-11-06 ENCOUNTER — Encounter: Payer: Self-pay | Admitting: Family

## 2020-11-07 ENCOUNTER — Encounter: Payer: Self-pay | Admitting: Family

## 2020-11-08 ENCOUNTER — Encounter: Payer: Self-pay | Admitting: Family

## 2020-11-09 ENCOUNTER — Inpatient Hospital Stay

## 2020-11-09 ENCOUNTER — Encounter: Payer: Self-pay | Admitting: Family

## 2020-11-09 ENCOUNTER — Inpatient Hospital Stay: Admitting: Family

## 2020-11-11 ENCOUNTER — Non-Acute Institutional Stay: Payer: Medicare Other | Admitting: Nurse Practitioner

## 2020-11-11 ENCOUNTER — Encounter: Payer: Self-pay | Admitting: Nurse Practitioner

## 2020-11-11 DIAGNOSIS — D631 Anemia in chronic kidney disease: Secondary | ICD-10-CM

## 2020-11-11 DIAGNOSIS — E1165 Type 2 diabetes mellitus with hyperglycemia: Secondary | ICD-10-CM

## 2020-11-11 DIAGNOSIS — R600 Localized edema: Secondary | ICD-10-CM | POA: Diagnosis not present

## 2020-11-11 DIAGNOSIS — K7031 Alcoholic cirrhosis of liver with ascites: Secondary | ICD-10-CM

## 2020-11-11 DIAGNOSIS — I482 Chronic atrial fibrillation, unspecified: Secondary | ICD-10-CM | POA: Diagnosis not present

## 2020-11-11 DIAGNOSIS — F419 Anxiety disorder, unspecified: Secondary | ICD-10-CM

## 2020-11-11 DIAGNOSIS — I5032 Chronic diastolic (congestive) heart failure: Secondary | ICD-10-CM | POA: Diagnosis not present

## 2020-11-11 DIAGNOSIS — Z794 Long term (current) use of insulin: Secondary | ICD-10-CM

## 2020-11-11 DIAGNOSIS — E039 Hypothyroidism, unspecified: Secondary | ICD-10-CM

## 2020-11-11 DIAGNOSIS — K5901 Slow transit constipation: Secondary | ICD-10-CM

## 2020-11-11 DIAGNOSIS — N1832 Chronic kidney disease, stage 3b: Secondary | ICD-10-CM

## 2020-11-11 DIAGNOSIS — K219 Gastro-esophageal reflux disease without esophagitis: Secondary | ICD-10-CM

## 2020-11-11 NOTE — Assessment & Plan Note (Signed)
takes Senokot S, MiraLax. 

## 2020-11-11 NOTE — Assessment & Plan Note (Signed)
DOE, chronic swelling legs, on Torsemide, Spironolactone.

## 2020-11-11 NOTE — Assessment & Plan Note (Signed)
BLE edema, better, on Spironolactone, Torsemide, Bun/creat 15/1.14 eGFR 47 09/15/20

## 2020-11-11 NOTE — Assessment & Plan Note (Addendum)
Hgb a1c 6.4 08/09/20, on Metformin, Januvia, insulin. Average am fasting CBG 101-163, noon 300s, pm 200s, will decrease hs Lantus to 30 u, continue Lantus 22u am, insulin lispro 10u if CBG>100, dc prn NPH, observe

## 2020-11-11 NOTE — Assessment & Plan Note (Signed)
Stable,  takes Lorazepam

## 2020-11-11 NOTE — Assessment & Plan Note (Signed)
Heart rate is in control, , takes Metoprolol

## 2020-11-11 NOTE — Assessment & Plan Note (Signed)
Alcoholic cirrhosis with ascites, on Spironolactone, folic acid. Under Hospice service.

## 2020-11-11 NOTE — Assessment & Plan Note (Signed)
TSH 0.895 08/08/20, on Levothyroxine

## 2020-11-11 NOTE — Assessment & Plan Note (Signed)
f/u Hematology, on iron infusion. Hgb 10.1, iron 50 09/15/20

## 2020-11-11 NOTE — Assessment & Plan Note (Signed)
akes Pantoprazole

## 2020-11-11 NOTE — Progress Notes (Signed)
Location:   [facility] Room Number: 678 Place of Service:  ALF [phone]) Provider: Lennie Odor Timofey Carandang NP  Virgie Dad, MD  Patient Care Team: Virgie Dad, MD as PCP - General (Internal Medicine) Berniece Salines, DO as PCP - Cardiology (Cardiology) Sheryn Bison, MD as Referring Physician (Dermatology) Gatha Mayer, MD as Consulting Physician (Gastroenterology) Marin Olp Rudell Cobb, MD as Consulting Physician (Oncology) Janan Ridge, MD as Consulting Physician (Dermatology) Paralee Cancel, MD as Consulting Physician (Orthopedic Surgery) Jacelyn Pi, MD as Consulting Physician (Endocrinology) Day, Melvenia Beam, [facility] (Inactive) as Pharmacist (Pharmacist) Berniece Salines, DO as Consulting Physician (Cardiology)  Extended Emergency Contact Information Primary Emergency Contact: Trinidad Curet [place] Phone: [phone] Mobile Phone: [phone] Relation: Son Secondary Emergency Contact: [place], [place] Mobile Phone: [phone] Relation: Relative Preferred language: English Interpreter needed? No  Code Status: DNR Goals of care: Advanced Directive information Advanced Directives 10/05/2020  Does Patient Have a Medical Advance Directive? Yes  Type of Advance Directive Living will;Out of facility DNR (pink MOST or yellow form)  Does patient want to make changes to medical advance directive? No - Patient declined  Copy of [place] in Chart? Yes - validated most recent copy scanned in chart (See row information)  Would patient like information on creating a medical advance directive? -  Pre-existing out of facility DNR order (yellow form or pink MOST form) Yellow form placed in chart (order not valid for inpatient use)     Chief Complaint  Patient presents with   Acute Visit    Evaluate blood sugar.     HPI:  Pt is a 87 y.o. female seen today for an acute visit for evaluate blood sugar.     BLE edema, better, on Spironolactone,  Torsemide, Bun/creat 15/1.14 eGFR 47 09/15/20             Chronic diastolic CHF, on Torsemide, Spironolactone.              T2DM, Hgb a1c 6.4 08/09/20, on Metformin, Januvia, insulin              Afib/tachycardia, chronic, takes Metoprolol             Hypothyroidism, TSH 0.895 08/08/20, on Levothyroxine             Anemia, f/u Hematology, on iron infusion. Hgb 10.1, iron 50 09/15/20             Alcoholic cirrhosis with ascites, on Spironolactone, folic acid. Under Hospice service.              Constipation, takes Senokot S, MiraLax.                    GERD, takes Pantoprazole             Anxiety, takes Lorazepam             Hyponatremia, Na 136 09/15/20 Past Medical History:  Diagnosis Date   Anemia    Anemia of chronic renal failure, stage 3 (moderate) ([employer]) 03/24/2020   Angiodysplasia of ascending colon 10/25/2014   Anxiety    Arthritis    Borderline diabetes    Cancer of the skin, basal cell 09/03/2012   Cirrhosis ([place])    Diabetes mellitus without complication ([place])    Diverticular disease    GAVE (gastric antral vascular ectasia) 09/09/2017   GERD (gastroesophageal reflux disease)    Hypertension    Iron deficiency anemia due to chronic  blood loss 01/19/2015   Portal hypertensive gastropathy ([place]) 08/02/2016   ? Some GAVE also   Thyroid disease    Past Surgical History:  Procedure Laterality Date   ABDOMINAL HYSTERECTOMY     APPENDECTOMY     COLONOSCOPY     ESOPHAGOGASTRODUODENOSCOPY     ESOPHAGOGASTRODUODENOSCOPY (EGD) WITH PROPOFOL N/A 02/22/2020   Procedure: ESOPHAGOGASTRODUODENOSCOPY (EGD) WITH PROPOFOL;  Surgeon: Gatha Mayer, MD;  Location: [facility];  Service: Endoscopy;  Laterality: N/A;   IR PARACENTESIS  05/25/2016   IR THORACENTESIS ASP PLEURAL SPACE W/IMG GUIDE  02/23/2020   LEG SKIN LESION  BIOPSY / EXCISION  12/11/14   MOHS SURGERY     ankle   TONSILLECTOMY     TOTAL HIP ARTHROPLASTY Bilateral 1993, 2006   UPPER GASTROINTESTINAL ENDOSCOPY  09/09/2017     Allergies  Allergen Reactions   Tizanidine Other (See Comments)    Hallucinate, confused     Allergies as of 11/11/2020       Reactions   Tizanidine Other (See Comments)   Hallucinate, confused         Medication List        Accurate as of November 11, 2020  3:53 PM. If you have any questions, ask your nurse or doctor.          acetaminophen 325 MG tablet Commonly known as: TYLENOL Take 650 mg by mouth every 6 (six) hours as needed.   folic acid 1 MG tablet Commonly known as: FOLVITE Take 1 tablet (1 mg total) by mouth daily.   insulin aspart 100 UNIT/ML injection Commonly known as: novoLOG Inject 10 Units into the skin 3 (three) times daily. CBG more then 100   insulin glargine 100 UNIT/ML injection Commonly known as: LANTUS Inject 0.22 mLs (22 Units total) into the skin every morning.   insulin glargine 100 UNIT/ML injection Commonly known as: LANTUS Inject 0.32 mLs (32 Units total) into the skin at bedtime.   lactulose 10 GM/15ML solution Commonly known as: CHRONULAC Take 20 g by mouth daily.   latanoprost 0.005 % ophthalmic solution Commonly known as: XALATAN Place 1 drop into both eyes at bedtime.   levothyroxine 75 MCG tablet Commonly known as: SYNTHROID Take 1 tablet (75 mcg total) by mouth daily.   LORazepam 0.5 MG tablet Commonly known as: ATIVAN Take 1 tablet (0.5 mg total) by mouth at bedtime.   metFORMIN 500 MG 24 hr tablet Commonly known as: GLUCOPHAGE-XR Take 1 tablet (500 mg total) by mouth daily.   metoprolol succinate 25 MG 24 hr tablet Commonly known as: TOPROL-XL Take 12.5 mg by mouth daily.   pantoprazole 40 MG tablet Commonly known as: PROTONIX Take 1 tablet (40 mg total) by mouth daily.   polyethylene glycol 17 g packet Commonly known as: MIRALAX / GLYCOLAX Take 17 g by mouth 2 (two) times daily.   potassium chloride SA 20 MEQ tablet Commonly known as: KLOR-CON Take 1 tablet (20 mEq total) by mouth daily.    senna-docusate 8.6-50 MG tablet Commonly known as: Senokot-S Take 1 tablet by mouth 2 (two) times daily.   simvastatin 20 MG tablet Commonly known as: ZOCOR TAKE ONE TABLET BY MOUTH EVERY NIGHT AT BEDTIME   sitaGLIPtin 50 MG tablet Commonly known as: JANUVIA Take 50 mg by mouth daily.   spironolactone 25 MG tablet Commonly known as: Aldactone Take 1 tablet (25 mg total) by mouth daily.   torsemide 20 MG tablet Commonly known as: DEMADEX Take 20 mg by  mouth 2 (two) times daily.   vitamin C 1000 MG tablet Take 1,000 mg by mouth at bedtime.   vitamin E 180 MG (400 UNITS) capsule Take 400 Units by mouth every evening.        Review of Systems  Constitutional:  Negative for activity change, appetite change and fever.  HENT:  Positive for hearing loss. Negative for congestion and trouble swallowing.   Eyes:  Negative for visual disturbance.  Respiratory:  Positive for shortness of breath. Negative for cough.        DOE  Cardiovascular:  Positive for leg swelling. Negative for chest pain and palpitations.  Gastrointestinal:  Negative for abdominal pain and constipation.  Genitourinary:  Negative for difficulty urinating, dysuria and urgency.  Musculoskeletal:  Positive for arthralgias. Negative for gait problem.  Skin:  Negative for color change.  Neurological:  Negative for speech difficulty, weakness and light-headedness.  Psychiatric/Behavioral:  Negative for behavioral problems and sleep disturbance. The patient is not nervous/anxious.    Immunization History  Administered Date(s) Administered   Fluad Quad(high Dose 65+) 10/27/2018, 02/09/2020   Hepatitis B, adult 05/12/2014   Hepatitis B, ped/adol 08/10/2014   Influenza Split 11/02/2009, 10/15/2010   Influenza, High Dose Seasonal PF 10/13/2014, 01/26/2017   Influenza, Quadrivalent, Recombinant, Inj, Pf 09/30/2017   Influenza, Seasonal, Injecte, Preservative Fre 10/13/2014   Influenza,inj,quad, With Preservative  11/22/2016   Influenza-Unspecified 11/25/2013, 10/06/2015, 10/22/2017   Moderna Sars-Covid-2 Vaccination 03/26/2019, 04/23/2019, 06/21/2020   PFIZER(Purple Top)SARS-COV-2 Vaccination 11/21/2019   Pneumococcal Conjugate-13 05/17/2014   Pneumococcal Polysaccharide-23 10/06/2015   Tdap 10/02/2011, 11/06/2016   Zoster, Live 01/22/2005   Pertinent  Health Maintenance Due  Topic Date Due   FOOT EXAM  05/12/2015   URINE MICROALBUMIN  04/27/2020   OPHTHALMOLOGY EXAM  05/17/2020   INFLUENZA VACCINE  11/16/2023 (Originally 08/22/2020)   HEMOGLOBIN A1C  02/09/2021   DEXA SCAN  Completed   COLONOSCOPY (Pts 45-51yr Insurance coverage will need to be confirmed)  Discontinued   Fall Risk  04/28/2019 01/09/2018 10/26/2016 10/09/2016 11/25/2015  Falls in the past year? 1 0 Yes No Yes  Number falls in past yr: 0 - 2 or more - 2 or more  Injury with Fall? 0 - No - Yes  Comment - - - - -  Risk Factor Category  - - High Fall Risk - High Fall Risk  Risk for fall due to : - - Impaired balance/gait - -  Follow up Falls evaluation completed - - - Falls prevention discussed   Functional Status Survey:    Vitals:   11/11/20 1530  BP: (!) 142/76  Pulse: 78  Resp: 18  Temp: 98.5 F (36.9 C)  SpO2: 99%  Weight: 183 lb (83 kg)   Body mass index is 28.66 kg/m. Physical Exam Vitals and nursing note reviewed.  Constitutional:      Appearance: Normal appearance.  HENT:     Head: Normocephalic and atraumatic.     Mouth/Throat:     Mouth: Mucous membranes are moist.  Eyes:     Extraocular Movements: Extraocular movements intact.     Conjunctiva/sclera: Conjunctivae normal.     Pupils: Pupils are equal, round, and reactive to light.  Cardiovascular:     Rate and Rhythm: Normal rate.     Heart sounds: No murmur heard. Pulmonary:     Effort: Pulmonary effort is normal.     Breath sounds: No wheezing, rhonchi or rales.     Comments: bibasilar Abdominal:  General: Bowel sounds are normal.      Palpations: Abdomen is soft.     Tenderness: There is no abdominal tenderness.  Musculoskeletal:     Cervical back: Normal range of motion and neck supple.     Right lower leg: Edema present.     Left lower leg: Edema present.     Comments: Trace edema BLE  Skin:    General: Skin is warm and dry.     Findings: No rash.     Comments: Venous insufficiency skin changes BLE  Neurological:     General: No focal deficit present.     Mental Status: She is alert. Mental status is at baseline.     Gait: Gait abnormal.     Comments: Oriented to person, place.   Psychiatric:        Mood and Affect: Mood normal.        Behavior: Behavior normal.        Thought Content: Thought content normal.        Judgment: Judgment normal.    Labs reviewed: Recent Labs    08/11/20 0442 08/12/20 0424 08/13/20 0410 08/14/20 0535 08/15/20 0421 08/23/20 0000 09/06/20 0000 09/09/20 0000 09/15/20 0824  NA 134*   < > 133* 134* 134*   < > 143 138 136  K 3.9   < > 3.8 3.6 3.8   < > 3.0* 3.5 4.1  CL 102   < > 100 105 103   < > 103 103 100  CO2 24   < > 25 25 25    < > 31* 24* 27  GLUCOSE 231*   < > 247* 168* 186*  --   --   --  241*  BUN 16   < > 13 14 15    < > 16 18 15   CREATININE 0.82   < > 0.82 0.83 0.97   < > 1.2* 1.3* 1.14*  CALCIUM 8.7*   < > 8.7* 8.5* 8.3*   < > 8.7 8.5* 9.2  MG 2.0  --  1.8 1.9  --   --   --   --   --    < > = values in this interval not displayed.   Recent Labs    08/12/20 0424 08/13/20 0410 08/23/20 0000 08/30/20 0000 09/06/20 0000 09/15/20 0824  AST 143* 94*   < > 32 24 31  ALT 50* 43   < > 14 15 18   ALKPHOS 98 91   < > 90 89 92  BILITOT 1.6* 1.3*  --   --   --  1.7*  PROT 5.2* 5.3*  --   --   --  5.8*  ALBUMIN 2.6* 2.6*   < > 2.7* 2.9* 3.1*   < > = values in this interval not displayed.   Recent Labs    08/13/20 0410 08/14/20 0535 08/23/20 0000 09/15/20 0824  WBC 3.5* 4.4 3.4 5.4  NEUTROABS 2.3  --  2,213.00 3.8  HGB 9.7* 9.3* 9.2* 10.1*  HCT 29.5* 29.4*  29* 32.3*  MCV 92.2 93.9  --  88.7  PLT 50* 56* 73* 85*   Lab Results  Component Value Date   TSH 0.895 08/08/2020   Lab Results  Component Value Date   HGBA1C 6.4 (H) 08/09/2020   Lab Results  Component Value Date   CHOL 112 11/09/2019   HDL 46 (L) 11/09/2019   LDLCALC 52 11/09/2019   TRIG 61 11/09/2019   CHOLHDL 2.4  11/09/2019    Significant Diagnostic Results in last 30 days:  No results found.  Assessment/Plan: Hyperglycemia due to type 2 diabetes mellitus (HCC) Hgb a1c 6.4 08/09/20, on Metformin, Januvia, insulin. Average am fasting CBG 101-163, noon 300s, pm 200s, will decrease hs Lantus to 30 u, continue Lantus 22u am, insulin lispro 10u if CBG>100. observe  Bilateral leg edema BLE edema, better, on Spironolactone, Torsemide, Bun/creat 15/1.14 eGFR 47 1/69/45  Chronic diastolic (congestive) heart failure (HCC) DOE, chronic swelling legs, on Torsemide, Spironolactone.   Chronic atrial fibrillation (HCC) Heart rate is in control, , takes Metoprolol  Adult hypothyroidism TSH 0.895 08/08/20, on Levothyroxine  Anemia in chronic kidney disease f/u Hematology, on iron infusion. Hgb 10.1, iron 50 0/38/88  Alcoholic cirrhosis of liver with ascites (HCC) Alcoholic cirrhosis with ascites, on Spironolactone, folic acid. Under Hospice service.   Slow transit constipation  takes Senokot S, MiraLax.         GERD without esophagitis akes Pantoprazole  Anxiety Stable,  takes Lorazepam    Family/ staff Communication: plan of care reviewed with the patient and charge nurse.   Labs/tests ordered:  none  Time spend 40 minutes.

## 2020-11-14 ENCOUNTER — Ambulatory Visit: Payer: HMO | Admitting: Cardiology

## 2020-11-25 ENCOUNTER — Emergency Department (HOSPITAL_COMMUNITY)

## 2020-11-25 ENCOUNTER — Encounter: Payer: Self-pay | Admitting: Family

## 2020-11-25 ENCOUNTER — Encounter (HOSPITAL_COMMUNITY): Payer: Self-pay | Admitting: Emergency Medicine

## 2020-11-25 ENCOUNTER — Emergency Department (HOSPITAL_COMMUNITY)
Admission: EM | Admit: 2020-11-25 | Discharge: 2020-11-25 | Disposition: A | Discharge status: Home / Self Care | Attending: Emergency Medicine | Admitting: Emergency Medicine

## 2020-11-25 DIAGNOSIS — Z85828 Personal history of other malignant neoplasm of skin: Secondary | ICD-10-CM | POA: Diagnosis not present

## 2020-11-25 DIAGNOSIS — Z794 Long term (current) use of insulin: Secondary | ICD-10-CM | POA: Diagnosis not present

## 2020-11-25 DIAGNOSIS — Z7984 Long term (current) use of oral hypoglycemic drugs: Secondary | ICD-10-CM | POA: Insufficient documentation

## 2020-11-25 DIAGNOSIS — S8991XA Unspecified injury of right lower leg, initial encounter: Secondary | ICD-10-CM | POA: Diagnosis present

## 2020-11-25 DIAGNOSIS — N183 Chronic kidney disease, stage 3 unspecified: Secondary | ICD-10-CM | POA: Insufficient documentation

## 2020-11-25 DIAGNOSIS — Z79899 Other long term (current) drug therapy: Secondary | ICD-10-CM | POA: Insufficient documentation

## 2020-11-25 DIAGNOSIS — I5032 Chronic diastolic (congestive) heart failure: Secondary | ICD-10-CM | POA: Insufficient documentation

## 2020-11-25 DIAGNOSIS — J918 Pleural effusion in other conditions classified elsewhere: Secondary | ICD-10-CM | POA: Diagnosis not present

## 2020-11-25 DIAGNOSIS — R000 Tachycardia, unspecified: Secondary | ICD-10-CM | POA: Insufficient documentation

## 2020-11-25 DIAGNOSIS — Z96643 Presence of artificial hip joint, bilateral: Secondary | ICD-10-CM | POA: Insufficient documentation

## 2020-11-25 DIAGNOSIS — R42 Dizziness and giddiness: Secondary | ICD-10-CM | POA: Diagnosis not present

## 2020-11-25 DIAGNOSIS — D72829 Elevated white blood cell count, unspecified: Secondary | ICD-10-CM | POA: Diagnosis not present

## 2020-11-25 DIAGNOSIS — E1140 Type 2 diabetes mellitus with diabetic neuropathy, unspecified: Secondary | ICD-10-CM | POA: Diagnosis not present

## 2020-11-25 DIAGNOSIS — E1122 Type 2 diabetes mellitus with diabetic chronic kidney disease: Secondary | ICD-10-CM | POA: Insufficient documentation

## 2020-11-25 DIAGNOSIS — E039 Hypothyroidism, unspecified: Secondary | ICD-10-CM | POA: Diagnosis not present

## 2020-11-25 DIAGNOSIS — J90 Pleural effusion, not elsewhere classified: Secondary | ICD-10-CM

## 2020-11-25 DIAGNOSIS — I130 Hypertensive heart and chronic kidney disease with heart failure and stage 1 through stage 4 chronic kidney disease, or unspecified chronic kidney disease: Secondary | ICD-10-CM | POA: Insufficient documentation

## 2020-11-25 DIAGNOSIS — W19XXXA Unspecified fall, initial encounter: Secondary | ICD-10-CM | POA: Insufficient documentation

## 2020-11-25 DIAGNOSIS — Z23 Encounter for immunization: Secondary | ICD-10-CM | POA: Insufficient documentation

## 2020-11-25 DIAGNOSIS — S81011A Laceration without foreign body, right knee, initial encounter: Secondary | ICD-10-CM | POA: Insufficient documentation

## 2020-11-25 LAB — BASIC METABOLIC PANEL
Anion gap: 11 (ref 5–15)
BUN: 15 mg/dL (ref 8–23)
CO2: 24 mmol/L (ref 22–32)
Calcium: 8.6 mg/dL — ABNORMAL LOW (ref 8.9–10.3)
Chloride: 99 mmol/L (ref 98–111)
Creatinine, Ser: 1.2 mg/dL — ABNORMAL HIGH (ref 0.44–1.00)
GFR, Estimated: 44 mL/min — ABNORMAL LOW (ref >60)
Glucose, Bld: 262 mg/dL — ABNORMAL HIGH (ref 70–99)
Potassium: 3.6 mmol/L (ref 3.5–5.1)
Sodium: 134 mmol/L — ABNORMAL LOW (ref 135–145)

## 2020-11-25 LAB — CK: Total CK: 203 U/L (ref 38–234)

## 2020-11-25 LAB — TSH: TSH: 1.387 u[IU]/mL (ref 0.350–4.500)

## 2020-11-25 LAB — CBC
HCT: 25.5 % — ABNORMAL LOW (ref 36.0–46.0)
Hemoglobin: 7.8 g/dL — ABNORMAL LOW (ref 12.0–15.0)
MCH: 24.6 pg — ABNORMAL LOW (ref 26.0–34.0)
MCHC: 30.6 g/dL (ref 30.0–36.0)
MCV: 80.4 fL (ref 80.0–100.0)
Platelets: 63 10*3/uL — ABNORMAL LOW (ref 150–400)
RBC: 3.17 MIL/uL — ABNORMAL LOW (ref 3.87–5.11)
RDW: 20.4 % — ABNORMAL HIGH (ref 11.5–15.5)
WBC: 12.1 10*3/uL — ABNORMAL HIGH (ref 4.0–10.5)
nRBC: 0 % (ref 0.0–0.2)

## 2020-11-25 LAB — MAGNESIUM: Magnesium: 1.7 mg/dL (ref 1.7–2.4)

## 2020-11-25 LAB — CBG MONITORING, ED: Glucose-Capillary: 261 mg/dL — ABNORMAL HIGH (ref 70–99)

## 2020-11-25 MED ORDER — BACITRACIN ZINC 500 UNIT/GM EX OINT: 1.0000 "application " | TOPICAL_OINTMENT | Freq: Every day | CUTANEOUS | 0 refills | Status: DC

## 2020-11-25 MED ORDER — BACITRACIN ZINC 500 UNIT/GM EX OINT: 1.0000 "application " | TOPICAL_OINTMENT | Freq: Two times a day (BID) | CUTANEOUS | 0 refills | Status: DC

## 2020-11-25 MED ORDER — CEPHALEXIN 500 MG PO CAPS: 500.0000 mg | ORAL_CAPSULE | Freq: Two times a day (BID) | ORAL | 0 refills | Status: AC

## 2020-11-25 MED ORDER — BACITRACIN ZINC 500 UNIT/GM EX OINT
TOPICAL_OINTMENT | Freq: Two times a day (BID) | CUTANEOUS | Status: DC
  Administered 2020-11-25: 1 via TOPICAL
  Filled 2020-11-25: qty 0.9

## 2020-11-25 MED ORDER — SODIUM CHLORIDE 0.9 % IV BOLUS
1000.0000 mL | Freq: Once | INTRAVENOUS | Status: AC
  Administered 2020-11-25: 1000 mL via INTRAVENOUS

## 2020-11-25 MED ORDER — TETANUS-DIPHTH-ACELL PERTUSSIS 5-2.5-18.5 LF-MCG/0.5 IM SUSY
0.5000 mL | PREFILLED_SYRINGE | Freq: Once | INTRAMUSCULAR | Status: AC
  Administered 2020-11-25: 0.5 mL via INTRAMUSCULAR
  Filled 2020-11-25: qty 0.5

## 2020-11-25 MED ORDER — METOPROLOL SUCCINATE ER 50 MG PO TB24
25.0000 mg | ORAL_TABLET | Freq: Every day | ORAL | Status: DC
  Administered 2020-11-25: 25 mg via ORAL
  Filled 2020-11-25: qty 1

## 2020-11-25 MED ORDER — METOPROLOL TARTRATE 5 MG/5ML IV SOLN
5.0000 mg | Freq: Once | INTRAVENOUS | Status: AC
  Administered 2020-11-25: 5 mg via INTRAVENOUS
  Filled 2020-11-25: qty 5

## 2020-11-25 MED ORDER — FENTANYL CITRATE PF 50 MCG/ML IJ SOSY
50.0000 ug | PREFILLED_SYRINGE | Freq: Once | INTRAMUSCULAR | Status: AC
  Administered 2020-11-25: 50 ug via INTRAVENOUS
  Filled 2020-11-25: qty 1

## 2020-11-25 NOTE — Progress Notes (Addendum)
CSW received call from [facility] ([facility]), she stated her concerns with pt needing a higher level of care. Per Jessica Dennis pt may need to return to [facility] under skilled nursing. Per Jessica Dennis she has spoken with pt's hospice nurse and it was agreed upon. Pt family is being consulted, CSW will wait on return call from [facility].  CSW continue to follow.      Adden 12:27pm CSW received call from pt's daughter, she stated she would prefer pt to remain at assistant living level of care. She reported she has been trying to get in contact with pt's hospice RN, with no luck. CSW contact [facility] and spoke with [facility], and informed her of family decision. [facility] is requesting D/C orders to be changed from twice a day to once a day. MD, PA, and RN notified.  CSW to call [facility] for transport.     Jessica Dennis.Jessica Dennis, MSW, [place]  Transitions of Care Clinical Social Worker I Direct Dial: [phone]  Fax: [phone] [email or web address]

## 2020-11-25 NOTE — Discharge Instructions (Addendum)
Wound Care Instructions: Please apply Bacitracin to right knee wound once a day. Wet to dry dressing over top with xeroform.  You have also been prescribed a course of antibiotics to prevent infection of your wound.   Please take all of your antibiotics until finished!   You may develop abdominal discomfort or diarrhea from the antibiotic.  You may help offset this with probiotics which you can buy or get in yogurt. Do not eat  or take the probiotics until 2 hours after your antibiotic.

## 2020-11-25 NOTE — Progress Notes (Signed)
PT is a patient under [facility]. CSW spoke with [employer], pt received [facility]CS in the home twice a week. She stated she will reach out to see if pt's hours can be increased. [facility] of [facility].   Arlie Solomons.Drewey Begue, MSW, [place]  Transitions of Care Clinical Social Worker I Direct Dial: [phone]  Fax: [phone] [email or web address]

## 2020-11-25 NOTE — Progress Notes (Signed)
[facility] called .  Arlie Solomons.Shiah Berhow, MSW, [place]  Transitions of Care Clinical Social Worker I Direct Dial: [phone]  Fax: [phone] [email or web address]

## 2020-11-25 NOTE — ED Triage Notes (Signed)
Patient here from [facility] reporting fall this morning after getting out of the bed unattended. R. Knee lac bleeding controlled. Denies hitting head, cognitive impairment.

## 2020-11-25 NOTE — ED Notes (Signed)
[facility] called report x[facility] with no answer. Social worker reported that she did tell staff patient was in route back to facility.

## 2020-11-25 NOTE — ED Provider Notes (Signed)
[facility] DEPT Provider Note   CSN: [id number] Arrival date & time: 11/25/20  5465     History Chief Complaint  Patient presents with   Lytle Michaels    Jessica Dennis is a 87 y.o. female.  With past medical history of Iron Deficiency anemia, hypothyroidism, diabetes type 2, CKD, alcohol cirrhosis, hypertension, lipidemia, cognitive impairment, atrial fibrillation, multiple falls.  Presents to the emergency department from [facility] reporting a fall that she said occurred couple days ago trying to sit in a recliner when she missed the chair and landed on her bottom.  She sustained an injury to her right knee with a large laceration.  She also has pain in her sacral area.  She denies hitting her head, neck pain, being on blood thinners, chest pain, shortness of breath, abdominal pain, other arthralgias.  Denies any dizziness, lightheadedness, focal weakness, numbness, vision changes, speech changes.  When asked why she did not present to the emergency department sooner, she is unable to tell me and says that it just hurts.  She states she has been ambulatory since the fall.  Patient does seem to be a poor historian given the different reports from EMS versus with the patient gave me regarding the timing of this incident.  Per EMS, this happened this morning.  Patient also unable to tell me whether she lives in independent living versus assisted living.  Dr Ronnald Nian with daughter-in-law.  Apparently the fall happened at some point this morning.  Patient was living at an independent living facility with weekly hospice nurse coming to see her.  She is normally in A. fib and heart rate usually hangs around 100-110.    Fall Pertinent negatives include no chest pain, no abdominal pain, no headaches and no shortness of breath.      Past Medical History:  Diagnosis Date   Anemia    Anemia of chronic renal failure, stage 3 (moderate) ([place]) 03/24/2020   Angiodysplasia of  ascending colon 10/25/2014   Anxiety    Arthritis    Borderline diabetes    Cancer of the skin, basal cell 09/03/2012   Cirrhosis ([place])    Diabetes mellitus without complication ([place])    Diverticular disease    GAVE (gastric antral vascular ectasia) 09/09/2017   GERD (gastroesophageal reflux disease)    Hypertension    Iron deficiency anemia due to chronic blood loss 01/19/2015   Portal hypertensive gastropathy ([place]) 08/02/2016   ? Some GAVE also   Thyroid disease     Patient Active Problem List   Diagnosis Date Noted   Slow transit constipation 08/22/2020   Bacteremia due to Gram-positive bacteria    Fall at home, initial encounter 08/09/2020   Lactic acidosis 08/09/2020   Mixed hyperlipidemia due to type 2 diabetes mellitus ([place]) 08/09/2020   Cognitive impairment 07/08/2020   Anemia in chronic kidney disease 05/18/2020   Bilirubinemia 05/13/2020   Hypokalemia 05/04/2020   CKD (chronic kidney disease) stage 3, GFR 30-59 ml/min (HCC) 03/24/2020   Hyponatremia 03/15/2020   Diabetic neuropathy ([place]) 02/29/2020   Pure hypercholesterolemia 02/29/2020   Thyroid disease    Hypertension    Cirrhosis of liver ([place])    Borderline diabetes    Chronic lower back pain    Anemia    Pancreatic cyst 02/01/2020   Pleural effusion on right 01/13/2020   Chronic diastolic (congestive) heart failure ([place]) 01/13/2020   Lightheadedness 11/27/2019   Nonspecific abnormal electrocardiogram (ECG) (EKG) 11/27/2019   Abnormal electrocardiogram 11/27/2019  Bilateral leg edema 11/27/2019   Primary hypertension 11/27/2019   Chronic atrial fibrillation ([place]) 11/09/2019   Multiple falls    Hyperglycemia due to type 2 diabetes mellitus ([place]) 10/27/2018   Generalized anxiety disorder 10/27/2018   Tremor of right hand 10/27/2018   GAVE (gastric antral vascular ectasia) 53/66/4403   Alcoholic cirrhosis of liver with ascites ([place]) 08/02/2016   Portal hypertensive gastropathy ([place]) 08/02/2016   Iron  deficiency anemia due to chronic blood loss 01/19/2015   Occult GI bleeding 01/19/2015   Angiodysplasia of ascending colon 10/25/2014   H/O bone density study 04/27/2013   Type 2 diabetes mellitus with hyperglycemia, with long-term current use of insulin ([place]) 03/24/2013   Vaginal dryness, menopausal 01/20/2013   Atrophic vaginitis 01/20/2013   S/P hysterectomy 01/20/2013   Cervical nerve root disorder 11/18/2012   Shoulder weakness 11/18/2012   Cancer of the skin, basal cell 09/03/2012   Squamous cell skin cancer 09/03/2012   Thrombocytopenia ([place]) 08/14/2012   Lymphadenopathy of right cervical region 04/01/2012   Hyperglycemia 09/19/2011   Heel pain 08/06/2011   Plantar fasciitis 08/06/2011   Arthritis of knee, degenerative 08/06/2011   Gonalgia 08/06/2011   History of anemia 06/28/2011   Anxiety 06/28/2011   Essential hypertension, benign 06/28/2011   GERD (gastroesophageal reflux disease) 06/28/2011   Hyperlipidemia LDL goal <100 06/28/2011   History of hysterectomy 06/28/2011   Menopause 06/28/2011   Glaucoma 06/28/2011   DJD (degenerative joint disease) 06/28/2011   Essential hypertension 04/27/2010   GERD without esophagitis 10/28/2009   Anemia, unspecified 05/24/2009   Hypothyroidism 11/22/2008   Adult hypothyroidism 11/22/2008   Diverticulosis of colon 05/15/2007   Elevated fasting blood sugar 05/15/2007   Other psoriasis 05/15/2007   Symptomatic menopausal or female climacteric states 05/15/2007   Impaired fasting glucose 05/15/2007    Past Surgical History:  Procedure Laterality Date   ABDOMINAL HYSTERECTOMY     APPENDECTOMY     COLONOSCOPY     ESOPHAGOGASTRODUODENOSCOPY     ESOPHAGOGASTRODUODENOSCOPY (EGD) WITH PROPOFOL N/A 02/22/2020   Procedure: ESOPHAGOGASTRODUODENOSCOPY (EGD) WITH PROPOFOL;  Surgeon: Gatha Mayer, MD;  Location: [facility] ENDOSCOPY;  Service: Endoscopy;  Laterality: N/A;   IR PARACENTESIS  05/25/2016   IR THORACENTESIS ASP PLEURAL SPACE W/IMG  GUIDE  02/23/2020   LEG SKIN LESION  BIOPSY / EXCISION  12/11/14   MOHS SURGERY     ankle   TONSILLECTOMY     TOTAL HIP ARTHROPLASTY Bilateral 1993, 2006   UPPER GASTROINTESTINAL ENDOSCOPY  09/09/2017     OB History     Gravida  2   Para  2   Term      Preterm      AB      Living  2      SAB      IAB      Ectopic      Multiple      Live Births              Family History  Problem Relation Age of Onset   Bladder Cancer Father    Heart attack Father    Diabetes Mother    Colon cancer Neg Hx    Colon polyps Neg Hx    Esophageal cancer Neg Hx    Rectal cancer Neg Hx    Stomach cancer Neg Hx     Social History   Tobacco Use   Smoking status: Never   Smokeless tobacco: Never  Vaping Use   Vaping Use:  Never used  Substance Use Topics   Alcohol use: Yes    Comment: weekly   Drug use: No    Home Medications Prior to Admission medications   Medication Sig Start Date End Date Taking? Authorizing Provider  cephALEXin (KEFLEX) 500 MG capsule Take 1 capsule (500 mg total) by mouth 2 (two) times daily for 7 days. 11/25/20 12/02/20 Yes Jonatan Wilsey, Adora Fridge, PA-C  acetaminophen (TYLENOL) 325 MG tablet Take 650 mg by mouth every 6 (six) hours as needed.    [provider]  Ascorbic Acid (VITAMIN C) 1000 MG tablet Take 1,000 mg by mouth at bedtime.    [provider]  bacitracin ointment Apply 1 application topically daily. 11/25/20   Domenique Southers, Adora Fridge, PA-C  folic acid (FOLVITE) 1 MG tablet Take 1 tablet (1 mg total) by mouth daily. 01/16/20   Hosie Poisson, MD  insulin aspart (NOVOLOG) 100 UNIT/ML injection Inject 10 Units into the skin 3 (three) times daily. CBG more then 100    [provider]  insulin glargine (LANTUS) 100 UNIT/ML injection Inject 0.22 mLs (22 Units total) into the skin every morning. 08/15/20   Eugenie Filler, MD  insulin glargine (LANTUS) 100 UNIT/ML injection Inject 0.32 mLs (32 Units total) into the skin at  bedtime. 08/15/20   Eugenie Filler, MD  lactulose (CHRONULAC) 10 GM/15ML solution Take 20 g by mouth daily.    [provider]  latanoprost (XALATAN) 0.005 % ophthalmic solution Place 1 drop into both eyes at bedtime.  08/19/12   [provider]  levothyroxine (SYNTHROID) 75 MCG tablet Take 1 tablet (75 mcg total) by mouth daily. 11/09/19   Roma Schanz R, DO  LORazepam (ATIVAN) 0.5 MG tablet Take 1 tablet (0.5 mg total) by mouth at bedtime. 10/19/20   [place]argo, Amy E, NP  metFORMIN (GLUCOPHAGE-XR) 500 MG 24 hr tablet Take 1 tablet (500 mg total) by mouth daily. 11/02/19   Ann Held, DO  metoprolol succinate (TOPROL-XL) 25 MG 24 hr tablet Take 12.5 mg by mouth daily.    [provider]  pantoprazole (PROTONIX) 40 MG tablet Take 1 tablet (40 mg total) by mouth daily. 07/24/19   Gatha Mayer, MD  polyethylene glycol (MIRALAX / GLYCOLAX) 17 g packet Take 17 g by mouth 2 (two) times daily. 08/15/20   Eugenie Filler, MD  potassium chloride SA (KLOR-CON) 20 MEQ tablet Take 1 tablet (20 mEq total) by mouth daily. 08/22/20   Eugenie Filler, MD  senna-docusate (SENOKOT-S) 8.6-50 MG tablet Take 1 tablet by mouth 2 (two) times daily. 08/15/20   Eugenie Filler, MD  simvastatin (ZOCOR) 20 MG tablet TAKE ONE TABLET BY MOUTH EVERY NIGHT AT BEDTIME 12/14/19   Carollee Herter, Yvonne R, DO  sitaGLIPtin (JANUVIA) 50 MG tablet Take 50 mg by mouth daily.    [provider]  spironolactone (ALDACTONE) 25 MG tablet Take 1 tablet (25 mg total) by mouth daily. 08/22/20   Eugenie Filler, MD  torsemide (DEMADEX) 20 MG tablet Take 20 mg by mouth 2 (two) times daily.    [provider]  vitamin E 180 MG (400 UNITS) capsule Take 400 Units by mouth every evening.    [provider]    Allergies    Tizanidine  Review of Systems   Review of Systems  Constitutional:  Negative for chills and fever.  HENT:  Negative for congestion, rhinorrhea and  sore throat.   Eyes:  Negative for  visual disturbance.  Respiratory:  Negative for cough, chest tightness and shortness of breath.   Cardiovascular:  Negative for chest pain, palpitations and leg swelling.  Gastrointestinal:  Negative for abdominal pain, blood in stool, constipation, diarrhea, nausea and vomiting.  Genitourinary:  Negative for dysuria, flank pain and hematuria.  Musculoskeletal:  Positive for arthralgias. Negative for back pain, joint swelling and neck pain.  Skin:  Positive for wound. Negative for rash.  Neurological:  Negative for dizziness, tremors, seizures, syncope, facial asymmetry, speech difficulty, weakness, light-headedness, numbness and headaches.  Psychiatric/Behavioral:  Negative for confusion.   All other systems reviewed and are negative.  Physical Exam Updated Vital Signs BP (!) 114/51   Pulse (!) 127   Temp 98 F (36.7 C) (Oral)   Resp (!) 36   SpO2 92%   Physical Exam Vitals and nursing note reviewed.  Constitutional:      General: She is not in acute distress.    Appearance: Normal appearance. She is ill-appearing. She is not toxic-appearing or diaphoretic.     Comments: Chronically ill appearing  HENT:     Head: Normocephalic and atraumatic.     Comments: No evidence of head trauma on exam.     Nose: Nose normal. No nasal deformity.     Mouth/Throat:     Lips: Pink. No lesions.     Mouth: Mucous membranes are dry. No injury, lacerations, oral lesions or angioedema.     Pharynx: Oropharynx is clear. Uvula midline. No pharyngeal swelling, oropharyngeal exudate, posterior oropharyngeal erythema or uvula swelling.     Comments: Dry mucous membranes Eyes:     General: Gaze aligned appropriately. No scleral icterus.       Right eye: No discharge.        Left eye: No discharge.     Extraocular Movements: Extraocular movements intact.     Conjunctiva/sclera: Conjunctivae normal.     Right eye: Right conjunctiva is not injected. No exudate or  hemorrhage.    Left eye: Left conjunctiva is not injected. No exudate or hemorrhage.    Pupils: Pupils are equal, round, and reactive to light.  Cardiovascular:     Rate and Rhythm: Tachycardia present. Rhythm irregular.     Pulses: Normal pulses.          Radial pulses are 2+ on the right side and 2+ on the left side.       Dorsalis pedis pulses are 2+ on the right side and 2+ on the left side.     Heart sounds: Normal heart sounds, S1 normal and S2 normal. Heart sounds not distant. No murmur heard.   No friction rub. No gallop. No S3 or S4 sounds.     Comments: A fib on monitor. HR 120s. Pulmonary:     Effort: Pulmonary effort is normal. No accessory muscle usage or respiratory distress.     Breath sounds: Normal breath sounds. No stridor. No wheezing, rhonchi or rales.  Chest:     Chest wall: No tenderness.  Abdominal:     General: Abdomen is flat. Bowel sounds are normal. There is no distension.     Palpations: Abdomen is soft. There is no mass or pulsatile mass.     Tenderness: There is no abdominal tenderness. There is no right CVA tenderness, left CVA tenderness, guarding or rebound.  Musculoskeletal:     Cervical back: Normal range of motion and neck supple. No rigidity or tenderness.     Right knee: Laceration and bony  tenderness present. No swelling. Normal range of motion. Tenderness present.     Right lower leg: No edema.     Left lower leg: No edema.     Comments: No cervical, thoracic, lumbar midline tenderness to palpation. No stepoffs or deformitis.  There is some right-sided paraspinal lumbar tenderness to palpation. DP/PT pulses 2+ and equal bilaterally No leg edema Sensation grossly intact on anterior thighs, dorsum of foot and lateral foot Strength of knee flexion and extension is 5/5 Plantar and dorsiflexion of ankle 5/5  No TTP of bilateral hips, some TTP to sacral area and lumbar musculature.  Right knee with normal ROM and strength. Large laceration overlying  right knee. Appears superficial with no penetration into joint cavity. No swelling associated.     Skin:    General: Skin is warm and dry.     Coloration: Skin is not jaundiced or pale.     Findings: Laceration present. No bruising, erythema, lesion or rash.     Comments: Large laceration overlying right knee  Neurological:     General: No focal deficit present.     Mental Status: She is alert and oriented to person, place, and time.     GCS: GCS eye subscore is 4. GCS verbal subscore is 5. GCS motor subscore is 6.  Psychiatric:        Mood and Affect: Mood normal.        Behavior: Behavior normal. Behavior is cooperative.     Comments: Delayed responses. Cognitively impaired at baseline    ED Results / Procedures / Treatments   Labs (all labs ordered are listed, but only abnormal results are displayed) Labs Reviewed  BASIC METABOLIC PANEL - Abnormal; Notable for the following components:      Result Value   Sodium 134 (*)    Glucose, Bld 262 (*)    Creatinine, Ser 1.20 (*)    Calcium 8.6 (*)    GFR, Estimated 44 (*)    All other components within normal limits  CBC - Abnormal; Notable for the following components:   WBC 12.1 (*)    RBC 3.17 (*)    Hemoglobin 7.8 (*)    HCT 25.5 (*)    MCH 24.6 (*)    RDW 20.4 (*)    Platelets 63 (*)    All other components within normal limits  CBG MONITORING, ED - Abnormal; Notable for the following components:   Glucose-Capillary 261 (*)    All other components within normal limits  MAGNESIUM  TSH  CK  CBG MONITORING, ED    EKG EKG Interpretation  Date/Time:  Friday November 25 2020 08:34:34 EDT Ventricular Rate:  121 PR Interval:  122 QRS Duration: 103 QT Interval:  329 QTC Calculation: 467 R Axis:   146 Text Interpretation: Sinus tachycardia w/PVCs but artifact Abnormal T, consider ischemia, lateral leads Confirmed by Lennice Sites ([id number]) on 11/25/2020 8:38:27 AM  Radiology DG Lumbar Spine Complete  Result Date:  11/25/2020 CLINICAL DATA:  Fall EXAM: LUMBAR SPINE - COMPLETE 4+ VIEW COMPARISON:  None. FINDINGS: There are 5 non-rib-bearing lumbar vertebrae. There is no evidence of lumbar spine fracture. There is multilevel degenerative disc disease, severe at L2-L3 and L4-L5. There is moderate lower lumbar predominant facet arthropathy. Unchanged inferior endplate concavity of L5. IMPRESSION: No evidence of lumbar spine fracture. Multilevel degenerative disc disease, severe at L2-L3 and L4-L5. Moderate lower lumbar predominant facet arthropathy. Electronically Signed   By: Maurine Simmering M.D.   On: 11/25/2020  08:44   CT Head Wo Contrast  Result Date: 11/25/2020 CLINICAL DATA:  Patient fell this morning getting out of bed. Right knee laceration. EXAM: CT HEAD WITHOUT CONTRAST CT CERVICAL SPINE WITHOUT CONTRAST TECHNIQUE: Multidetector CT imaging of the head and cervical spine was performed following the standard protocol without intravenous contrast. Multiplanar CT image reconstructions of the cervical spine were also generated. COMPARISON:  10/16/2019. FINDINGS: CT HEAD FINDINGS Brain: No evidence of acute infarction, hemorrhage, hydrocephalus, extra-axial collection or mass effect. Small mass, projects at the level of the anterior communicating artery, again raising concern for and anterior communicating artery/A1 segment aneurysm, without change from the prior study. This measures 8 mm in long axis. No other extra-axial abnormalities. Patchy areas of white matter hypoattenuation are noted consistent with mild chronic microvascular ischemic change, stable. Vascular: No hyperdense vessel or unexpected calcification. Skull: Normal. Negative for fracture or focal lesion. Sinuses/Orbits: Globes and orbits are unremarkable. Visualized sinuses are clear. Other: None. CT CERVICAL SPINE FINDINGS Alignment: Straightened cervical lordosis.  No spondylolisthesis. Skull base and vertebrae: No acute fracture. No primary bone lesion or  focal pathologic process. Soft tissues and spinal canal: No prevertebral fluid or swelling. No visible canal hematoma. Disc levels: Mild loss of disc height at C3-C4. Moderate to marked loss of disc height from C4-C5 through C6-C7 with endplate spurring and disc bulging. Facet degenerative change noted bilaterally. No convincing disc herniation. No change from the prior cervical CT. Upper chest: No mass or adenopathy. Large right pleural effusion, new since the prior study. Other: None. IMPRESSION: HEAD CT 1. No acute intracranial abnormality. 2. 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 CERVICAL CT 1. No fracture or acute osseous abnormality. 2. Large right pleural effusion new since the prior cervical CT. Electronically Signed   By: Lajean Manes M.D.   On: 11/25/2020 09:16   CT Cervical Spine Wo Contrast  Result Date: 11/25/2020 CLINICAL DATA:  Patient fell this morning getting out of bed. Right knee laceration. EXAM: CT HEAD WITHOUT CONTRAST CT CERVICAL SPINE WITHOUT CONTRAST TECHNIQUE: Multidetector CT imaging of the head and cervical spine was performed following the standard protocol without intravenous contrast. Multiplanar CT image reconstructions of the cervical spine were also generated. COMPARISON:  10/16/2019. FINDINGS: CT HEAD FINDINGS Brain: No evidence of acute infarction, hemorrhage, hydrocephalus, extra-axial collection or mass effect. Small mass, projects at the level of the anterior communicating artery, again raising concern for and anterior communicating artery/A1 segment aneurysm, without change from the prior study. This measures 8 mm in long axis. No other extra-axial abnormalities. Patchy areas of white matter hypoattenuation are noted consistent with mild chronic microvascular ischemic change, stable. Vascular: No hyperdense vessel or unexpected calcification. Skull: Normal. Negative for fracture or  focal lesion. Sinuses/Orbits: Globes and orbits are unremarkable. Visualized sinuses are clear. Other: None. CT CERVICAL SPINE FINDINGS Alignment: Straightened cervical lordosis.  No spondylolisthesis. Skull base and vertebrae: No acute fracture. No primary bone lesion or focal pathologic process. Soft tissues and spinal canal: No prevertebral fluid or swelling. No visible canal hematoma. Disc levels: Mild loss of disc height at C3-C4. Moderate to marked loss of disc height from C4-C5 through C6-C7 with endplate spurring and disc bulging. Facet degenerative change noted bilaterally. No convincing disc herniation. No change from the prior cervical CT. Upper chest: No mass or adenopathy. Large right pleural effusion, new since the prior study. Other: None. IMPRESSION: HEAD CT 1. No acute intracranial abnormality.  2. 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 CERVICAL CT 1. No fracture or acute osseous abnormality. 2. Large right pleural effusion new since the prior cervical CT. Electronically Signed   By: Lajean Manes M.D.   On: 11/25/2020 09:16   DG Pelvis Comp Min 3V  Result Date: 11/25/2020 CLINICAL DATA:  right knee injury EXAM: [facility] PELVIS - 3+ VIEW COMPARISON:  CT 08/08/2020 FINDINGS: There is no evidence of acute fracture. There are bilateral total hip arthroplasties. There is superolateral positioning of the left femoral head within the acetabular cup, with lucency underlying the acetabular component, similar appearance to prior CT in July 2022. Mild superolateral positioning of the right femoral head within the acetabular cup as well but to a much lesser degree. There is no significant lucency the adjacent bone. Lower lumbar spine and bilateral SI joint degenerative changes. IMPRESSION: No radiographically evident pelvic fracture. 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 Electronically Signed   By: Maurine Simmering M.D.   On: 11/25/2020 08:42   DG Knee Complete 4 Views Right  Result Date: 11/25/2020 CLINICAL DATA:  Right knee injury EXAM: RIGHT KNEE - COMPLETE 4+ VIEW COMPARISON:  Right knee x-ray 10/16/2019 FINDINGS: No acute fracture or dislocation identified. Mild narrowing of the patellofemoral joint and medial compartment with small marginal osteophytes. Minimal chondrocalcinosis. Bones are osteopenic. Evidence of soft tissue injury at the anterior knee. No radiopaque foreign body identified. IMPRESSION: No acute osseous abnormality identified. Electronically Signed   By: Ofilia Neas M.D.   On: 11/25/2020 08:38    Procedures Procedures   Medications Ordered in ED Medications  bacitracin ointment (1 application Topical Given 11/25/20 0903)  metoprolol succinate (TOPROL-XL) 24 hr tablet 25 mg (25 mg Oral Given 11/25/20 1029)  sodium chloride 0.9 % bolus 1,000 mL (0 mLs Intravenous Stopped 11/25/20 1328)  Tdap (BOOSTRIX) injection 0.5 mL (0.5 mLs Intramuscular Given 11/25/20 0847)  fentaNYL (SUBLIMAZE) injection 50 mcg (50 mcg Intravenous Given 11/25/20 0903)  metoprolol tartrate (LOPRESSOR) injection 5 mg (5 mg Intravenous Given 11/25/20 2637)    ED Course  I have reviewed the triage vital signs and the nursing notes.  Pertinent labs & imaging results that were available during my care of the patient were reviewed by me and considered in my medical decision making (see chart for details).  Clinical Course as of 11/25/20 1448  Ludwig Clarks Nov 25, 2020  8588 Unable to reach Jennette Bill, son, phone went straight to Voicemail. [GL]  [phone] Unable to reach Sterling Ucci, daughter in law [GL]  [phone] CT cervical spine with large right sided pleural effusion [GL]    Clinical Course User Index [GL] Cova Knieriem, Adora Fridge, PA-C   MDM Rules/Calculators/A&P                           This is a 87 y.o. female who presents to the ED with fall this morning.  She presents with  a large laceration to her right knee and sacral pain.  Denies head injury or being on blood thinners.  She is also in A. fib RVR to 125.  Vitals: Initially heart rate to 125 and irregular.  Blood pressure normal.  Afebrile.  Oxygenating well on room air.  Patient is chronically ill appearing and in no acute distress. Exam with large laceration to right knee.  No evidence of  head trauma, neck trauma, chest wall injury, abdominal injury.  No spinal tenderness to palpation or step-offs noted.  Patient seems to be cognitively impaired, however this is baseline. -Treated pain with fentanyl 50 mcg IV -5 mg Metoprolol IV for A fib, 1 L IVF -Wound extensively cleansed and dressed with bacitracin ointment. Tdap updated.  I personally reviewed all laboratory work and imaging.  -XRay of lumbar spine, pelvis, and right knee are all negative for fracture. -CT head and cervical spine with no evidence of intracranial injury or fracture.  They did note to be a large right pleural effusion that was larger previous CT. -Labs with mild leukocytosis.  Hemoglobin is 7.8 which is down from two months ago.  Thrombocytopenia of 63 which is chronic.  BMP with no significant electrolyte derangements.  Creatinine is stable.  TSH, CK, and mag are normal. - EKG with ST and PVCs   Reviewed chart.  It appears patient has had multiple thoracentesis of pleural cavity for recurrent pleural effusion.  Patient with no symptoms of right pleural effusion.  Last thoracentesis was March 01, 2020.  She is seen by pulmonology.  They thought that pleural effusion is secondary to alcoholic cirrhosis.  Is currently on 40 mg of Lasix and 50 mg of spironolactone.  Dr. Ronnald Nian spoke with patient's daughter-in-law.  Patient has been on hospice for a while now.  They do not wish to pursue any further thoracentesis.  They plan on withdrawing the rest of her medications after the holidays and have patient become comfort care at this time.  Family  declines any intensive treatment, and wishes for her to return home with supportive measures.  Family declines transitioning patient to a more intensive assisted living.   On reevaluation, heart rate is still fluctuating from about 114-127 after metoprolol and fluids.  This is typical for patient, so we will restart patient's home metoprolol.  Pain is more controlled.  Plan is for patient to be discharged back to current facility where hospice nurse will come in once daily for dressing changes.  Portions of this note were generated with [employer]. Dictation errors may occur despite best attempts at proofreading.  Final Clinical Impression(s) / ED Diagnoses Final diagnoses:  Pleural effusion  Fall, initial encounter  Knee laceration, right, initial encounter    Rx / DC Orders ED Discharge Orders          Ordered    bacitracin ointment  2 times daily,   Status:  Discontinued        11/25/20 1239    cephALEXin (KEFLEX) 500 MG capsule  2 times daily        11/25/20 1239    bacitracin ointment  Daily        11/25/20 1239             Adolphus Birchwood, PA-C 11/25/20 [place], [place], DO 11/25/20 1454

## 2020-11-25 NOTE — ED Provider Notes (Addendum)
I personally evaluated the patient during the encounter and completed a history, physical, procedures, medical decision making to contribute to the overall care of the patient and decision making for the patient briefly, the patient is a 87 y.o. female here with fall.  Some confusion at baseline.  She arrives with tachycardia which appears to be a sinus rhythm in the 120s.  Per family this is around her normal heart rate.  She does take metoprolol.  She has significant skin tear to the right knee but there is no deep laceration.  Most of the skin is avulsed and not very amenable to repair as any type of suture/Dermabond likely will hold very well.  We will let this heal by secondary intention.  X-ray was negative for fracture.  Will do antibiotic ointment twice daily with wound dressing changes twice daily.  We will prophylactically put on antibiotics.  Educated family about wound care and watching for infection.  Patient does have a hospice nurse that comes out several days a week.  We will see if we can get her to come out more often and talk with transitions of care team.  We will give pain medicine, IV fluids, Lopressor for tachycardia and check lab work to evaluate for dehydration or electrolyte abnormalities.  Family states that she does not drink water much or eat very much.  We will get a scan of her head or neck.  We will continue to help make medical decisions for the patient but see PA note for further results, evaluation, disposition of the patient.  Images were overall unremarkable.  CT of the neck did suggest maybe a large pleural effusion on the right.  She has a history of the same.  Talked with daughter-in-law on the phone and with her husband who is power of attorney they would not like to pursue any thoracentesis or further evaluation of this.  She is currently on hospice.  She is not having any respiratory symptoms.  She is on room air.  They state that after the holidays they are likely to  stop given her insulin and will likely focus on comfort care only.  If she does develop respiratory symptoms they would likely just transition to comfort care at that time as well.  Overall work-up today is unremarkable otherwise.  We will have her discharge and wound care instructions have been given.  This chart was dictated using voice recognition software.  Despite best efforts to proofread,  errors can occur which can change the documentation meaning.    EKG Interpretation  Date/Time:  Friday November 25 2020 08:34:34 EDT Ventricular Rate:  121 PR Interval:  122 QRS Duration: 103 QT Interval:  329 QTC Calculation: 467 R Axis:   146 Text Interpretation: Sinus tachycardia w/PVCs but artifact Abnormal T, consider ischemia, lateral leads Confirmed by Lennice Sites ([id number]) on 11/25/2020 8:38:27 AM            Lennice Sites, DO 11/25/20 [place], [place], DO 11/25/20 0945

## 2020-11-25 NOTE — Progress Notes (Signed)
[name])      This patient is an active hospice patient with [facility], admitted 10/13/20 with a terminal diagnosis of hypertensive heart disease with heart failure.  [facility] will continue to follow for any discharge planning needs and to coordinate continuation of hospice care.    At time of discharge please use [facility] for transport as we contract this service for our active hospice patients. TOC made aware.  Thank you for the opportunity to participate in this patient's care.     Domenic Moras, BSN, RN [facility] Liaison [phone] (24h on call)

## 2020-12-01 ENCOUNTER — Encounter: Payer: Self-pay | Admitting: Nurse Practitioner

## 2020-12-01 ENCOUNTER — Non-Acute Institutional Stay: Payer: Medicare Other | Admitting: Nurse Practitioner

## 2020-12-01 DIAGNOSIS — K7031 Alcoholic cirrhosis of liver with ascites: Secondary | ICD-10-CM

## 2020-12-01 DIAGNOSIS — I5032 Chronic diastolic (congestive) heart failure: Secondary | ICD-10-CM

## 2020-12-01 DIAGNOSIS — S81011A Laceration without foreign body, right knee, initial encounter: Secondary | ICD-10-CM | POA: Insufficient documentation

## 2020-12-01 DIAGNOSIS — S81011D Laceration without foreign body, right knee, subsequent encounter: Secondary | ICD-10-CM | POA: Diagnosis not present

## 2020-12-01 DIAGNOSIS — K219 Gastro-esophageal reflux disease without esophagitis: Secondary | ICD-10-CM

## 2020-12-01 DIAGNOSIS — E871 Hypo-osmolality and hyponatremia: Secondary | ICD-10-CM

## 2020-12-01 DIAGNOSIS — N1832 Chronic kidney disease, stage 3b: Secondary | ICD-10-CM

## 2020-12-01 DIAGNOSIS — I482 Chronic atrial fibrillation, unspecified: Secondary | ICD-10-CM

## 2020-12-01 DIAGNOSIS — K5901 Slow transit constipation: Secondary | ICD-10-CM

## 2020-12-01 DIAGNOSIS — E1165 Type 2 diabetes mellitus with hyperglycemia: Secondary | ICD-10-CM

## 2020-12-01 DIAGNOSIS — J90 Pleural effusion, not elsewhere classified: Secondary | ICD-10-CM

## 2020-12-01 DIAGNOSIS — E039 Hypothyroidism, unspecified: Secondary | ICD-10-CM

## 2020-12-01 DIAGNOSIS — D631 Anemia in chronic kidney disease: Secondary | ICD-10-CM

## 2020-12-01 DIAGNOSIS — F419 Anxiety disorder, unspecified: Secondary | ICD-10-CM

## 2020-12-01 DIAGNOSIS — Z794 Long term (current) use of insulin: Secondary | ICD-10-CM

## 2020-12-01 NOTE — Assessment & Plan Note (Signed)
chronic, takes Metoprolol

## 2020-12-01 NOTE — Assessment & Plan Note (Signed)
TSH 1.387 11/25/20, on Levothyroxine

## 2020-12-01 NOTE — Progress Notes (Signed)
Location:   [facility] Room Number: [place] Point of Service:  ALF [id number]) Provider:  Jahmya Onofrio X, NP  Virgie Dad, MD  Patient Care Team: Virgie Dad, MD as PCP - General (Internal Medicine) Berniece Salines, DO as PCP - Cardiology (Cardiology) Sheryn Bison, MD as Referring Physician (Dermatology) Gatha Mayer, MD as Consulting Physician (Gastroenterology) Marin Olp Rudell Cobb, MD as Consulting Physician (Oncology) Janan Ridge, MD as Consulting Physician (Dermatology) Paralee Cancel, MD as Consulting Physician (Orthopedic Surgery) Jacelyn Pi, MD as Consulting Physician (Endocrinology) Day, Melvenia Beam, [facility] (Inactive) as Pharmacist (Pharmacist) Berniece Salines, DO as Consulting Physician (Cardiology)  Extended Emergency Contact Information Primary Emergency Contact: Trinidad Curet [place] Phone: [phone] Mobile Phone: [phone] Relation: Son Secondary Emergency Contact: Chesni, [place] Mobile Phone: [phone] Relation: Relative Preferred language: English Interpreter needed? No  Code Status:  DNR Goals of care: Advanced Directive information Advanced Directives 12/01/2020  Does Patient Have a Medical Advance Directive? Yes  Type of Advance Directive Living will;Out of facility DNR (pink MOST or yellow form)  Does patient want to make changes to medical advance directive? No - Patient declined  Copy of [place] in Chart? Yes - validated most recent copy scanned in chart (See row information)  Would patient like information on creating a medical advance directive? -  Pre-existing out of facility DNR order (yellow form or pink MOST form) Yellow form placed in chart (order not valid for inpatient use)     Chief Complaint  Patient presents with   Acute Visit    Acute visit for right knee abrasion    HPI:  Pt is a 87 y.o. female seen today for an acute visit for right knee abrasion/laceration, slightly  redness and yellow drainage noted.   [place] 11/25/20, resulted multiple skin tears right knee, attempted Dermabond closures in ED, reopened later. Negative X-ray for fxs. Treated with Keflex x 7 days.   CT neck 11/25/20 showed a R large pleural effusion, not new, declined thoracentesis or further workups.    BLE edema, better, on Spironolactone, Torsemide, Bun/creat 15/1.2 11/25/20             Chronic diastolic CHF, on Torsemide, Spironolactone.              T2DM, Hgb a1c 6.4 08/09/20, on Metformin, Januvia, insulin              Afib/tachycardia, chronic, takes Metoprolol             Hypothyroidism, TSH 1.387 11/25/20, on Levothyroxine             Anemia, f/u Hematology, on iron infusion. Hgb 7.8 11/25/20, iron 50 09/15/20             Alcoholic cirrhosis with ascites, on Spironolactone, folic acid. Under Hospice service.              Constipation, takes Senokot S, MiraLax.                    GERD, takes Pantoprazole             Anxiety, takes Lorazepam             Hyponatremia, Na 134 11/25/20   Past Medical History:  Diagnosis Date   Anemia    Anemia of chronic renal failure, stage 3 (moderate) ([place]) 03/24/2020   Angiodysplasia of ascending colon 10/25/2014   Anxiety    Arthritis  Borderline diabetes    Cancer of the skin, basal cell 09/03/2012   Cirrhosis ([place])    Diabetes mellitus without complication ([place])    Diverticular disease    GAVE (gastric antral vascular ectasia) 09/09/2017   GERD (gastroesophageal reflux disease)    Hypertension    Iron deficiency anemia due to chronic blood loss 01/19/2015   Portal hypertensive gastropathy ([place]) 08/02/2016   ? Some GAVE also   Thyroid disease    Past Surgical History:  Procedure Laterality Date   ABDOMINAL HYSTERECTOMY     APPENDECTOMY     COLONOSCOPY     ESOPHAGOGASTRODUODENOSCOPY     ESOPHAGOGASTRODUODENOSCOPY (EGD) WITH PROPOFOL N/A 02/22/2020   Procedure: ESOPHAGOGASTRODUODENOSCOPY (EGD) WITH PROPOFOL;  Surgeon: Gatha Mayer, MD;   Location: [facility];  Service: Endoscopy;  Laterality: N/A;   IR PARACENTESIS  05/25/2016   IR THORACENTESIS ASP PLEURAL SPACE W/IMG GUIDE  02/23/2020   LEG SKIN LESION  BIOPSY / EXCISION  12/11/14   MOHS SURGERY     ankle   TONSILLECTOMY     TOTAL HIP ARTHROPLASTY Bilateral 1993, 2006   UPPER GASTROINTESTINAL ENDOSCOPY  09/09/2017    Allergies  Allergen Reactions   Tizanidine Other (See Comments)    Hallucinate, confused     Allergies as of 12/01/2020       Reactions   Tizanidine Other (See Comments)   Hallucinate, confused         Medication List        Accurate as of December 01, 2020 11:59 PM. If you have any questions, ask your nurse or doctor.          STOP taking these medications    insulin aspart 100 UNIT/ML injection Commonly known as: novoLOG Stopped by: Valen Gillison X Quandre Polinski, NP       TAKE these medications    acetaminophen 325 MG tablet Commonly known as: TYLENOL Take 650 mg by mouth every 6 (six) hours as needed.   bacitracin ointment Apply 1 application topically daily.   cephALEXin 500 MG capsule Commonly known as: KEFLEX Take 1 capsule (500 mg total) by mouth 2 (two) times daily for 7 days.   folic acid 1 MG tablet Commonly known as: FOLVITE Take 1 tablet (1 mg total) by mouth daily.   insulin glargine 100 UNIT/ML injection Commonly known as: LANTUS Inject 0.22 mLs (22 Units total) into the skin every morning. What changed: Another medication with the same name was changed. Make sure you understand how and when to take each.   insulin glargine 100 UNIT/ML injection Commonly known as: LANTUS Inject 0.32 mLs (32 Units total) into the skin at bedtime. What changed: how much to take   lactulose 10 GM/15ML solution Commonly known as: CHRONULAC Take 20 g by mouth daily.   latanoprost 0.005 % ophthalmic solution Commonly known as: XALATAN Place 1 drop into both eyes at bedtime.   levothyroxine 75 MCG tablet Commonly known as:  SYNTHROID Take 1 tablet (75 mcg total) by mouth daily.   LORazepam 0.5 MG tablet Commonly known as: ATIVAN Take 1 tablet (0.5 mg total) by mouth at bedtime.   metFORMIN 500 MG 24 hr tablet Commonly known as: GLUCOPHAGE-XR Take 1 tablet (500 mg total) by mouth daily.   metoprolol succinate 25 MG 24 hr tablet Commonly known as: TOPROL-XL Take 12.5 mg by mouth daily.   pantoprazole 40 MG tablet Commonly known as: PROTONIX Take 1 tablet (40 mg total) by mouth daily.   polyethylene glycol 17  g packet Commonly known as: MIRALAX / GLYCOLAX Take 17 g by mouth 2 (two) times daily.   potassium chloride 10 MEQ CR capsule Commonly known as: MICRO-K Take 10 mEq by mouth daily.   potassium chloride SA 20 MEQ tablet Commonly known as: KLOR-CON Take 1 tablet (20 mEq total) by mouth daily.   saccharomyces boulardii 250 MG capsule Commonly known as: FLORASTOR Take 250 mg by mouth 2 (two) times daily.   senna-docusate 8.6-50 MG tablet Commonly known as: Senokot-S Take 1 tablet by mouth 2 (two) times daily.   simvastatin 20 MG tablet Commonly known as: ZOCOR TAKE ONE TABLET BY MOUTH EVERY NIGHT AT BEDTIME   sitaGLIPtin 50 MG tablet Commonly known as: JANUVIA Take 50 mg by mouth daily.   spironolactone 25 MG tablet Commonly known as: Aldactone Take 1 tablet (25 mg total) by mouth daily.   torsemide 20 MG tablet Commonly known as: DEMADEX Take 20 mg by mouth once.   torsemide 10 MG tablet Commonly known as: DEMADEX Take 10 mg by mouth every evening.   vitamin C 1000 MG tablet Take 1,000 mg by mouth at bedtime.   vitamin E 180 MG (400 UNITS) capsule Take 400 Units by mouth every evening.        Review of Systems  Constitutional:  Negative for activity change, appetite change and fever.  HENT:  Positive for hearing loss. Negative for congestion and trouble swallowing.   Eyes:  Negative for visual disturbance.  Respiratory:  Positive for shortness of breath. Negative  for cough.        DOE  Cardiovascular:  Positive for leg swelling. Negative for chest pain and palpitations.  Gastrointestinal:  Negative for abdominal pain and constipation.  Genitourinary:  Negative for difficulty urinating, dysuria and urgency.  Musculoskeletal:  Positive for arthralgias. Negative for gait problem.  Skin:  Positive for wound.  Neurological:  Negative for speech difficulty, weakness and light-headedness.  Psychiatric/Behavioral:  Negative for behavioral problems and sleep disturbance. The patient is not nervous/anxious.    Immunization History  Administered Date(s) Administered   Fluad Quad(high Dose 65+) 10/27/2018, 02/09/2020   Hepatitis B, adult 05/12/2014   Hepatitis B, ped/adol 08/10/2014   Influenza Split 11/02/2009, 10/15/2010   Influenza, High Dose Seasonal PF 10/13/2014, 01/26/2017   Influenza, Quadrivalent, Recombinant, Inj, Pf 09/30/2017   Influenza, Seasonal, Injecte, Preservative Fre 10/13/2014   Influenza,inj,quad, With Preservative 11/22/2016   Influenza-Unspecified 11/25/2013, 10/06/2015, 10/22/2017, 11/10/2020   Moderna Sars-Covid-2 Vaccination 03/26/2019, 04/23/2019, 06/21/2020   PFIZER(Purple Top)SARS-COV-2 Vaccination 11/21/2019   Pfizer Covid-19 Vaccine Bivalent Booster 88yrs & up 10/11/2020   Pneumococcal Conjugate-13 05/17/2014   Pneumococcal Polysaccharide-23 10/06/2015   Tdap 10/02/2011, 11/06/2016, 11/25/2020   Zoster, Live 01/22/2005   Pertinent  Health Maintenance Due  Topic Date Due   FOOT EXAM  05/12/2015   URINE MICROALBUMIN  04/27/2020   OPHTHALMOLOGY EXAM  05/17/2020   HEMOGLOBIN A1C  02/09/2021   INFLUENZA VACCINE  Completed   DEXA SCAN  Completed   COLONOSCOPY (Pts 45-71yrs Insurance coverage will need to be confirmed)  Discontinued   Fall Risk 08/14/2020 08/15/2020 08/15/2020 09/15/2020 11/25/2020  Falls in the past year? - - - - -  Was there an injury with Fall? - - - - -  Was there an injury with Fall? - - - - -  Fall Risk  Category Calculator - - - - -  Fall Risk Category - - - - -  Patient Fall Risk Level High fall risk High  fall risk High fall risk High fall risk High fall risk  Patient at Risk for Falls Due to - - - - -  Fall risk Follow up - - - - -   Functional Status Survey:    Vitals:   12/01/20 1001  BP: 124/78  Pulse: 80  Resp: (!) 22  Temp: 98.2 F (36.8 C)  SpO2: 99%  Weight: 187 lb (84.8 kg)  Height: 5\' 7"  (1.702 m)   Body mass index is 29.29 kg/m. Physical Exam Vitals and nursing note reviewed.  Constitutional:      Appearance: Normal appearance.  HENT:     Head: Normocephalic and atraumatic.     Mouth/Throat:     Mouth: Mucous membranes are moist.  Eyes:     Extraocular Movements: Extraocular movements intact.     Conjunctiva/sclera: Conjunctivae normal.     Pupils: Pupils are equal, round, and reactive to light.  Cardiovascular:     Rate and Rhythm: Normal rate.     Heart sounds: No murmur heard. Pulmonary:     Effort: Pulmonary effort is normal.     Breath sounds: No wheezing, rhonchi or rales.     Comments: bibasilar Abdominal:     General: Bowel sounds are normal.     Palpations: Abdomen is soft.     Tenderness: There is no abdominal tenderness.  Musculoskeletal:     Cervical back: Normal range of motion and neck supple.     Right lower leg: Edema present.     Left lower leg: Edema present.     Comments: Trace edema BLE  Skin:    General: Skin is warm and dry.     Findings: No rash.     Comments: Venous insufficiency skin changes BLE. Reopened dermabond closed skin laceration of the right knee, slightly warmth, redness, and yellow drainage.   Neurological:     General: No focal deficit present.     Mental Status: She is alert. Mental status is at baseline.     Gait: Gait abnormal.     Comments: Oriented to person, place.   Psychiatric:        Mood and Affect: Mood normal.        Behavior: Behavior normal.        Thought Content: Thought content normal.         Judgment: Judgment normal.    Labs reviewed: Recent Labs    08/13/20 0410 08/14/20 0535 08/15/20 0421 08/23/20 0000 09/09/20 0000 09/15/20 0824 11/25/20 0818  NA 133* 134* 134*   < > 138 136 134*  K 3.8 3.6 3.8   < > 3.5 4.1 3.6  CL 100 105 103   < > 103 100 99  CO2 25 25 25    < > 24* 27 24  GLUCOSE 247* 168* 186*  --   --  241* 262*  BUN 13 14 15    < > 18 15 15   CREATININE 0.82 0.83 0.97   < > 1.3* 1.14* 1.20*  CALCIUM 8.7* 8.5* 8.3*   < > 8.5* 9.2 8.6*  MG 1.8 1.9  --   --   --   --  1.7   < > = values in this interval not displayed.   Recent Labs    08/12/20 0424 08/13/20 0410 08/23/20 0000 08/30/20 0000 09/06/20 0000 09/15/20 0824  AST 143* 94*   < > 32 24 31  ALT 50* 43   < > 14 15 18   ALKPHOS 98  91   < > 90 89 92  BILITOT 1.6* 1.3*  --   --   --  1.7*  PROT 5.2* 5.3*  --   --   --  5.8*  ALBUMIN 2.6* 2.6*   < > 2.7* 2.9* 3.1*   < > = values in this interval not displayed.   Recent Labs    08/13/20 0410 08/14/20 0535 08/23/20 0000 09/15/20 0824 11/25/20 1131  WBC 3.5* 4.4 3.4 5.4 12.1*  NEUTROABS 2.3  --  2,213.00 3.8  --   HGB 9.7* 9.3* 9.2* 10.1* 7.8*  HCT 29.5* 29.4* 29* 32.3* 25.5*  MCV 92.2 93.9  --  88.7 80.4  PLT 50* 56* 73* 85* 63*   Lab Results  Component Value Date   TSH 1.387 11/25/2020   Lab Results  Component Value Date   HGBA1C 6.4 (H) 08/09/2020   Lab Results  Component Value Date   CHOL 112 11/09/2019   HDL 46 (L) 11/09/2019   LDLCALC 52 11/09/2019   TRIG 61 11/09/2019   CHOLHDL 2.4 11/09/2019    Significant Diagnostic Results in last 30 days:  DG Lumbar Spine Complete  Result Date: 11/25/2020 CLINICAL DATA:  Fall EXAM: LUMBAR SPINE - COMPLETE 4+ VIEW COMPARISON:  None. FINDINGS: There are 5 non-rib-bearing lumbar vertebrae. There is no evidence of lumbar spine fracture. There is multilevel degenerative disc disease, severe at L2-L3 and L4-L5. There is moderate lower lumbar predominant facet arthropathy. Unchanged  inferior endplate concavity of L5. IMPRESSION: No evidence of lumbar spine fracture. Multilevel degenerative disc disease, severe at L2-L3 and L4-L5. Moderate lower lumbar predominant facet arthropathy. Electronically Signed   By: Maurine Simmering M.D.   On: 11/25/2020 08:44   CT Head Wo Contrast  Result Date: 11/25/2020 CLINICAL DATA:  Patient fell this morning getting out of bed. Right knee laceration. EXAM: CT HEAD WITHOUT CONTRAST CT CERVICAL SPINE WITHOUT CONTRAST TECHNIQUE: Multidetector CT imaging of the head and cervical spine was performed following the standard protocol without intravenous contrast. Multiplanar CT image reconstructions of the cervical spine were also generated. COMPARISON:  10/16/2019. FINDINGS: CT HEAD FINDINGS Brain: No evidence of acute infarction, hemorrhage, hydrocephalus, extra-axial collection or mass effect. Small mass, projects at the level of the anterior communicating artery, again raising concern for and anterior communicating artery/A1 segment aneurysm, without change from the prior study. This measures 8 mm in long axis. No other extra-axial abnormalities. Patchy areas of white matter hypoattenuation are noted consistent with mild chronic microvascular ischemic change, stable. Vascular: No hyperdense vessel or unexpected calcification. Skull: Normal. Negative for fracture or focal lesion. Sinuses/Orbits: Globes and orbits are unremarkable. Visualized sinuses are clear. Other: None. CT CERVICAL SPINE FINDINGS Alignment: Straightened cervical lordosis.  No spondylolisthesis. Skull base and vertebrae: No acute fracture. No primary bone lesion or focal pathologic process. Soft tissues and spinal canal: No prevertebral fluid or swelling. No visible canal hematoma. Disc levels: Mild loss of disc height at C3-C4. Moderate to marked loss of disc height from C4-C5 through C6-C7 with endplate spurring and disc bulging. Facet degenerative change noted bilaterally. No convincing disc  herniation. No change from the prior cervical CT. Upper chest: No mass or adenopathy. Large right pleural effusion, new since the prior study. Other: None. IMPRESSION: HEAD CT 1. No acute intracranial abnormality. 2. 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 CERVICAL CT 1. No fracture or acute osseous abnormality. 2. Large right pleural effusion new  since the prior cervical CT. Electronically Signed   By: Lajean Manes M.D.   On: 11/25/2020 09:16   CT Cervical Spine Wo Contrast  Result Date: 11/25/2020 CLINICAL DATA:  Patient fell this morning getting out of bed. Right knee laceration. EXAM: CT HEAD WITHOUT CONTRAST CT CERVICAL SPINE WITHOUT CONTRAST TECHNIQUE: Multidetector CT imaging of the head and cervical spine was performed following the standard protocol without intravenous contrast. Multiplanar CT image reconstructions of the cervical spine were also generated. COMPARISON:  10/16/2019. FINDINGS: CT HEAD FINDINGS Brain: No evidence of acute infarction, hemorrhage, hydrocephalus, extra-axial collection or mass effect. Small mass, projects at the level of the anterior communicating artery, again raising concern for and anterior communicating artery/A1 segment aneurysm, without change from the prior study. This measures 8 mm in long axis. No other extra-axial abnormalities. Patchy areas of white matter hypoattenuation are noted consistent with mild chronic microvascular ischemic change, stable. Vascular: No hyperdense vessel or unexpected calcification. Skull: Normal. Negative for fracture or focal lesion. Sinuses/Orbits: Globes and orbits are unremarkable. Visualized sinuses are clear. Other: None. CT CERVICAL SPINE FINDINGS Alignment: Straightened cervical lordosis.  No spondylolisthesis. Skull base and vertebrae: No acute fracture. No primary bone lesion or focal pathologic process. Soft tissues and spinal canal: No  prevertebral fluid or swelling. No visible canal hematoma. Disc levels: Mild loss of disc height at C3-C4. Moderate to marked loss of disc height from C4-C5 through C6-C7 with endplate spurring and disc bulging. Facet degenerative change noted bilaterally. No convincing disc herniation. No change from the prior cervical CT. Upper chest: No mass or adenopathy. Large right pleural effusion, new since the prior study. Other: None. IMPRESSION: HEAD CT 1. No acute intracranial abnormality. 2. 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 CERVICAL CT 1. No fracture or acute osseous abnormality. 2. Large right pleural effusion new since the prior cervical CT. Electronically Signed   By: Lajean Manes M.D.   On: 11/25/2020 09:16   DG Pelvis Comp Min 3V  Result Date: 11/25/2020 CLINICAL DATA:  right knee injury EXAM: [facility] PELVIS - 3+ VIEW COMPARISON:  CT 08/08/2020 FINDINGS: There is no evidence of acute fracture. There are bilateral total hip arthroplasties. There is superolateral positioning of the left femoral head within the acetabular cup, with lucency underlying the acetabular component, similar appearance to prior CT in July 2022. Mild superolateral positioning of the right femoral head within the acetabular cup as well but to a much lesser degree. There is no significant lucency the adjacent bone. Lower lumbar spine and bilateral SI joint degenerative changes. IMPRESSION: No radiographically evident pelvic fracture. 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 Electronically Signed   By: Maurine Simmering M.D.   On: 11/25/2020 08:42   DG Knee Complete 4 Views Right  Result Date: 11/25/2020 CLINICAL DATA:  Right knee injury EXAM: RIGHT KNEE - COMPLETE 4+ VIEW COMPARISON:  Right knee x-ray 10/16/2019 FINDINGS: No acute fracture or dislocation identified. Mild narrowing of the  patellofemoral joint and medial compartment with small marginal osteophytes. Minimal chondrocalcinosis. Bones are osteopenic. Evidence of soft tissue injury at the anterior knee. No radiopaque foreign body identified. IMPRESSION: No acute osseous abnormality identified. Electronically Signed   By: Ofilia Neas M.D.   On: 11/25/2020 08:38    Assessment/Plan Laceration of skin of right knee [place] 11/25/20, resulted multiple skin tears right knee, attempted Dermabond closures in ED, reopened  later. Negative X-ray for fxs. Treated with Keflex x 7 days. The right knee abrasion/laceration, slightly redness and yellow drainage noted. Will change to Bactroban oint bid to the right knee wounds, cover with non adhesive dressing.   Pleural effusion on right CT neck 11/25/20 showed a R large pleural effusion, not new, declined thoracentesis or further workups.   Chronic diastolic (congestive) heart failure (HCC) better, on Spironolactone, Torsemide, Bun/creat 15/1.2 11/25/20  Hyperglycemia due to type 2 diabetes mellitus ([place]) Hgb a1c 6.4 08/09/20, on Metformin, Januvia, insulin   Chronic atrial fibrillation (HCC) chronic, takes Metoprolol  Adult hypothyroidism TSH 1.387 11/25/20, on Levothyroxine  Anemia in chronic kidney disease /u Hematology, on iron infusion. Hgb 7.8 11/25/20, iron 50 9/82/64  Alcoholic cirrhosis of liver with ascites ([place]) Alcoholic cirrhosis with ascites, on Spironolactone, folic acid. Under Hospice service.  Slow transit constipation takes Senokot S, MiraLax.       GERD (gastroesophageal reflux disease) takes Pantoprazole  Anxiety akes Lorazepam  Hyponatremia Na 134 11/25/20    Family/ staff Communication: plan of care reviewed with the patient and charge nurse.   Labs/tests ordered:   none  Time spend 40 minutes.

## 2020-12-01 NOTE — Assessment & Plan Note (Signed)
Na 134 11/25/20

## 2020-12-01 NOTE — Assessment & Plan Note (Signed)
[place] 11/25/20, resulted multiple skin tears right knee, attempted Dermabond closures in ED, reopened later. Negative X-ray for fxs. Treated with Keflex x 7 days. The right knee abrasion/laceration, slightly redness and yellow drainage noted. Will change to Bactroban oint bid to the right knee wounds, cover with non adhesive dressing.

## 2020-12-01 NOTE — Assessment & Plan Note (Signed)
/  u Hematology, on iron infusion. Hgb 7.8 11/25/20, iron 50 09/15/20

## 2020-12-01 NOTE — Assessment & Plan Note (Signed)
Hgb a1c 6.4 08/09/20, on Metformin, Januvia, insulin

## 2020-12-01 NOTE — Assessment & Plan Note (Signed)
akes Lorazepam

## 2020-12-01 NOTE — Assessment & Plan Note (Signed)
Alcoholic cirrhosis with ascites, on Spironolactone, folic acid. Under Hospice service.

## 2020-12-01 NOTE — Assessment & Plan Note (Signed)
takes Pantoprazole 

## 2020-12-01 NOTE — Assessment & Plan Note (Signed)
CT neck 11/25/20 showed a R large pleural effusion, not new, declined thoracentesis or further workups.

## 2020-12-01 NOTE — Assessment & Plan Note (Signed)
takes Senokot S, MiraLax. 

## 2020-12-01 NOTE — Assessment & Plan Note (Signed)
better, on Spironolactone, Torsemide, Bun/creat 15/1.2 11/25/20

## 2020-12-02 ENCOUNTER — Encounter: Payer: Self-pay | Admitting: Nurse Practitioner

## 2021-01-02 ENCOUNTER — Encounter: Payer: Self-pay | Admitting: Nurse Practitioner

## 2021-01-02 ENCOUNTER — Non-Acute Institutional Stay: Payer: Medicare Other | Admitting: Nurse Practitioner

## 2021-01-02 DIAGNOSIS — E871 Hypo-osmolality and hyponatremia: Secondary | ICD-10-CM

## 2021-01-02 DIAGNOSIS — K7031 Alcoholic cirrhosis of liver with ascites: Secondary | ICD-10-CM

## 2021-01-02 DIAGNOSIS — J90 Pleural effusion, not elsewhere classified: Secondary | ICD-10-CM

## 2021-01-02 DIAGNOSIS — N1832 Chronic kidney disease, stage 3b: Secondary | ICD-10-CM

## 2021-01-02 DIAGNOSIS — I5032 Chronic diastolic (congestive) heart failure: Secondary | ICD-10-CM | POA: Diagnosis not present

## 2021-01-02 DIAGNOSIS — E1165 Type 2 diabetes mellitus with hyperglycemia: Secondary | ICD-10-CM

## 2021-01-02 DIAGNOSIS — L89322 Pressure ulcer of left buttock, stage 2: Secondary | ICD-10-CM | POA: Diagnosis not present

## 2021-01-02 DIAGNOSIS — E039 Hypothyroidism, unspecified: Secondary | ICD-10-CM

## 2021-01-02 DIAGNOSIS — D631 Anemia in chronic kidney disease: Secondary | ICD-10-CM

## 2021-01-02 DIAGNOSIS — K5901 Slow transit constipation: Secondary | ICD-10-CM

## 2021-01-02 DIAGNOSIS — R600 Localized edema: Secondary | ICD-10-CM

## 2021-01-02 DIAGNOSIS — Z794 Long term (current) use of insulin: Secondary | ICD-10-CM

## 2021-01-02 DIAGNOSIS — I482 Chronic atrial fibrillation, unspecified: Secondary | ICD-10-CM

## 2021-01-02 DIAGNOSIS — K219 Gastro-esophageal reflux disease without esophagitis: Secondary | ICD-10-CM

## 2021-01-02 NOTE — Assessment & Plan Note (Signed)
Alcoholic cirrhosis with ascites, on Spironolactone, folic acid. Under Hospice service.

## 2021-01-02 NOTE — Progress Notes (Signed)
Location:   [place] Room Number: [place] of Service:  ALF [phone]) Provider:  Heiress Williamson X, NP  Virgie Dad, MD  Patient Care Team: Virgie Dad, MD as PCP - General (Internal Medicine) Berniece Salines, DO as PCP - Cardiology (Cardiology) Sheryn Bison, MD as Referring Physician (Dermatology) Gatha Mayer, MD as Consulting Physician (Gastroenterology) Marin Olp Rudell Cobb, MD as Consulting Physician (Oncology) Janan Ridge, MD as Consulting Physician (Dermatology) Paralee Cancel, MD as Consulting Physician (Orthopedic Surgery) Jacelyn Pi, MD as Consulting Physician (Endocrinology) Day, Melvenia Beam, [facility] (Inactive) as Pharmacist (Pharmacist) Berniece Salines, DO as Consulting Physician (Cardiology)  Extended Emergency Contact Information Primary Emergency Contact: Trinidad Curet [place] Phone: [phone] Mobile Phone: [phone] Relation: Son Secondary Emergency Contact: Ivey, Nembhard Mobile Phone: [phone] Relation: Relative Preferred language: English Interpreter needed? No  Code Status:  DNR Goals of care: Advanced Directive information Advanced Directives 01/02/2021  Does Patient Have a Medical Advance Directive? Yes  Type of Advance Directive Living will;Out of facility DNR (pink MOST or yellow form)  Does patient want to make changes to medical advance directive? No - Patient declined  Copy of [place] in Chart? Yes - validated most recent copy scanned in chart (See row information)  Would patient like information on creating a medical advance directive? -  Pre-existing out of facility DNR order (yellow form or pink MOST form) Yellow form placed in chart (order not valid for inpatient use)     Chief Complaint  Patient presents with   Medical Management of Chronic Issues    Routine Visit   Quality Metric Gaps    Shingrix,  Foot exam, urine Microalbumin, Eye exam    HPI:  Pt is a 87 y.o.  female seen today for medical management of chronic diseases.    Pressure ulcer L buttock, stage 2, no s/s of infection.    CT neck 11/25/20 showed a R large pleural effusion, not new, declined thoracentesis or further workups.               BLE edema, better, on Spironolactone, Torsemide, Bun/creat 15/1.2 11/25/20             Chronic diastolic CHF, on Torsemide, Spironolactone.              T2DM, Hgb a1c 6.4 08/09/20, on Metformin, Januvia, insulin              Afib/tachycardia, chronic, takes Metoprolol             Hypothyroidism, TSH 1.387 11/25/20, on Levothyroxine             Anemia, f/u Hematology, on iron infusion. Hgb 7.8 11/25/20, iron 50 09/15/20             Alcoholic cirrhosis with ascites, on Spironolactone, folic acid. Under Hospice service.              Constipation, takes Senokot S, MiraLax.                    GERD, takes Pantoprazole             Anxiety, takes Lorazepam, Lexapro.              Hyponatremia, Na 134 11/25/20     Past Medical History:  Diagnosis Date   Anemia    Anemia of chronic renal failure, stage 3 (moderate) ([place]) 03/24/2020   Angiodysplasia of ascending colon 10/25/2014  Anxiety    Arthritis    Borderline diabetes    Cancer of the skin, basal cell 09/03/2012   Cirrhosis ([place])    Diabetes mellitus without complication ([place])    Diverticular disease    GAVE (gastric antral vascular ectasia) 09/09/2017   GERD (gastroesophageal reflux disease)    Hypertension    Iron deficiency anemia due to chronic blood loss 01/19/2015   Portal hypertensive gastropathy ([place]) 08/02/2016   ? Some GAVE also   Thyroid disease    Past Surgical History:  Procedure Laterality Date   ABDOMINAL HYSTERECTOMY     APPENDECTOMY     COLONOSCOPY     ESOPHAGOGASTRODUODENOSCOPY     ESOPHAGOGASTRODUODENOSCOPY (EGD) WITH PROPOFOL N/A 02/22/2020   Procedure: ESOPHAGOGASTRODUODENOSCOPY (EGD) WITH PROPOFOL;  Surgeon: Gatha Mayer, MD;  Location: [facility];  Service: Endoscopy;   Laterality: N/A;   IR PARACENTESIS  05/25/2016   IR THORACENTESIS ASP PLEURAL SPACE W/IMG GUIDE  02/23/2020   LEG SKIN LESION  BIOPSY / EXCISION  12/11/14   MOHS SURGERY     ankle   TONSILLECTOMY     TOTAL HIP ARTHROPLASTY Bilateral 1993, 2006   UPPER GASTROINTESTINAL ENDOSCOPY  09/09/2017    Allergies  Allergen Reactions   Tizanidine Other (See Comments)    Hallucinate, confused     Allergies as of 01/02/2021       Reactions   Tizanidine Other (See Comments)   Hallucinate, confused         Medication List        Accurate as of January 02, 2021 11:59 PM. If you have any questions, ask your nurse or doctor.          STOP taking these medications    bacitracin ointment Stopped by: Cheridan Kibler X Lavonna Lampron, NP   polyethylene glycol 17 g packet Commonly known as: MIRALAX / GLYCOLAX Stopped by: Osmin Welz X Divina Neale, NP   saccharomyces boulardii 250 MG capsule Commonly known as: FLORASTOR Stopped by: Onesha Krebbs X Heavenleigh Petruzzi, NP       TAKE these medications    acetaminophen 325 MG tablet Commonly known as: TYLENOL Take 650 mg by mouth every 6 (six) hours as needed.   escitalopram 10 MG tablet Commonly known as: LEXAPRO Take 10 mg by mouth daily.   folic acid 1 MG tablet Commonly known as: FOLVITE Take 1 tablet (1 mg total) by mouth daily.   insulin glargine 100 UNIT/ML injection Commonly known as: LANTUS Inject 0.22 mLs (22 Units total) into the skin every morning. What changed: Another medication with the same name was changed. Make sure you understand how and when to take each.   insulin glargine 100 UNIT/ML injection Commonly known as: LANTUS Inject 0.32 mLs (32 Units total) into the skin at bedtime. What changed: how much to take   lactulose 10 GM/15ML solution Commonly known as: CHRONULAC Take by mouth daily. 30 ml   latanoprost 0.005 % ophthalmic solution Commonly known as: XALATAN Place 1 drop into both eyes at bedtime.   levothyroxine 75 MCG tablet Commonly known as:  SYNTHROID Take 1 tablet (75 mcg total) by mouth daily.   LORazepam 0.5 MG tablet Commonly known as: ATIVAN Take 1 tablet (0.5 mg total) by mouth at bedtime.   metFORMIN 500 MG 24 hr tablet Commonly known as: GLUCOPHAGE-XR Take 1 tablet (500 mg total) by mouth daily.   metoprolol succinate 25 MG 24 hr tablet Commonly known as: TOPROL-XL Take 12.5 mg by mouth daily.   pantoprazole 40 MG  tablet Commonly known as: PROTONIX Take 1 tablet (40 mg total) by mouth daily.   potassium chloride 10 MEQ CR capsule Commonly known as: MICRO-K Take 10 mEq by mouth daily.   potassium chloride SA 20 MEQ tablet Commonly known as: KLOR-CON M Take 1 tablet (20 mEq total) by mouth daily.   senna-docusate 8.6-50 MG tablet Commonly known as: Senokot-S Take 1 tablet by mouth 2 (two) times daily.   simvastatin 20 MG tablet Commonly known as: ZOCOR TAKE ONE TABLET BY MOUTH EVERY NIGHT AT BEDTIME   sitaGLIPtin 50 MG tablet Commonly known as: JANUVIA Take 50 mg by mouth daily.   spironolactone 25 MG tablet Commonly known as: Aldactone Take 1 tablet (25 mg total) by mouth daily.   torsemide 20 MG tablet Commonly known as: DEMADEX Take 20 mg by mouth once.   torsemide 10 MG tablet Commonly known as: DEMADEX Take 10 mg by mouth every evening.   vitamin C 1000 MG tablet Take 1,000 mg by mouth at bedtime.   vitamin E 180 MG (400 UNITS) capsule Take 400 Units by mouth every evening.        Review of Systems  Constitutional:  Negative for activity change, appetite change and fever.  HENT:  Positive for hearing loss. Negative for congestion and trouble swallowing.   Eyes:  Negative for visual disturbance.  Respiratory:  Positive for shortness of breath. Negative for cough.        DOE  Cardiovascular:  Positive for leg swelling. Negative for chest pain and palpitations.  Gastrointestinal:  Negative for abdominal pain and constipation.  Genitourinary:  Negative for difficulty urinating,  dysuria and urgency.  Musculoskeletal:  Positive for arthralgias. Negative for gait problem.  Skin:  Positive for wound.  Neurological:  Negative for speech difficulty, weakness and light-headedness.  Psychiatric/Behavioral:  Negative for behavioral problems and sleep disturbance. The patient is not nervous/anxious.    Immunization History  Administered Date(s) Administered   Fluad Quad(high Dose 65+) 10/27/2018, 02/09/2020   Hepatitis B, adult 05/12/2014   Hepatitis B, ped/adol 08/10/2014   Influenza Split 11/02/2009, 10/15/2010   Influenza, High Dose Seasonal PF 10/13/2014, 01/26/2017   Influenza, Quadrivalent, Recombinant, Inj, Pf 09/30/2017   Influenza, Seasonal, Injecte, Preservative Fre 10/13/2014   Influenza,inj,quad, With Preservative 11/22/2016   Influenza-Unspecified 11/25/2013, 10/06/2015, 10/22/2017, 11/10/2020   Moderna Sars-Covid-2 Vaccination 03/26/2019, 04/23/2019, 06/21/2020   PFIZER(Purple Top)SARS-COV-2 Vaccination 11/21/2019   Pfizer Covid-19 Vaccine Bivalent Booster 50yrs & up 10/11/2020   Pneumococcal Conjugate-13 05/17/2014   Pneumococcal Polysaccharide-23 10/06/2015   Tdap 10/02/2011, 11/06/2016, 11/25/2020   Zoster, Live 01/22/2005   Pertinent  Health Maintenance Due  Topic Date Due   FOOT EXAM  05/12/2015   URINE MICROALBUMIN  04/27/2020   OPHTHALMOLOGY EXAM  05/17/2020   HEMOGLOBIN A1C  02/09/2021   INFLUENZA VACCINE  Completed   DEXA SCAN  Completed   COLONOSCOPY (Pts 45-89yrs Insurance coverage will need to be confirmed)  Discontinued   Fall Risk 08/14/2020 08/15/2020 08/15/2020 09/15/2020 11/25/2020  Falls in the past year? - - - - -  Was there an injury with Fall? - - - - -  Was there an injury with Fall? - - - - -  Fall Risk Category Calculator - - - - -  Fall Risk Category - - - - -  Patient Fall Risk Level High fall risk High fall risk High fall risk High fall risk High fall risk  Patient at Risk for Falls Due to - - - - -  Fall risk Follow up -  - - - -   Functional Status Survey:    Vitals:   01/02/21 1538  BP: 125/73  Pulse: 70  Resp: 16  Temp: 98.2 F (36.8 C)  SpO2: 92%  Weight: 185 lb (83.9 kg)  Height: 5\' 7"  (1.702 m)   Body mass index is 28.98 kg/m. Physical Exam Vitals and nursing note reviewed.  Constitutional:      Appearance: Normal appearance.  HENT:     Head: Normocephalic and atraumatic.     Mouth/Throat:     Mouth: Mucous membranes are moist.  Eyes:     Extraocular Movements: Extraocular movements intact.     Conjunctiva/sclera: Conjunctivae normal.     Pupils: Pupils are equal, round, and reactive to light.  Cardiovascular:     Rate and Rhythm: Normal rate.     Heart sounds: No murmur heard. Pulmonary:     Effort: Pulmonary effort is normal.     Breath sounds: No wheezing, rhonchi or rales.     Comments: bibasilar Abdominal:     General: Bowel sounds are normal.     Palpations: Abdomen is soft.     Tenderness: There is no abdominal tenderness.  Musculoskeletal:     Cervical back: Normal range of motion and neck supple.     Right lower leg: Edema present.     Left lower leg: Edema present.     Comments: 1+ edema BLE  Skin:    General: Skin is warm and dry.     Comments: Venous insufficiency skin changes BLE. Stage 2 pressure ulcer about a quarter sized with superficial skin missing area left buttock, no s/s of infection.   Neurological:     General: No focal deficit present.     Mental Status: She is alert. Mental status is at baseline.     Gait: Gait abnormal.     Comments: Oriented to person, place.   Psychiatric:        Mood and Affect: Mood normal.        Behavior: Behavior normal.        Thought Content: Thought content normal.        Judgment: Judgment normal.    Labs reviewed: Recent Labs    08/13/20 0410 08/14/20 0535 08/15/20 0421 08/23/20 0000 09/09/20 0000 09/15/20 0824 11/25/20 0818  NA 133* 134* 134*   < > 138 136 134*  K 3.8 3.6 3.8   < > 3.5 4.1 3.6  CL  100 105 103   < > 103 100 99  CO2 25 25 25    < > 24* 27 24  GLUCOSE 247* 168* 186*  --   --  241* 262*  BUN 13 14 15    < > 18 15 15   CREATININE 0.82 0.83 0.97   < > 1.3* 1.14* 1.20*  CALCIUM 8.7* 8.5* 8.3*   < > 8.5* 9.2 8.6*  MG 1.8 1.9  --   --   --   --  1.7   < > = values in this interval not displayed.   Recent Labs    08/12/20 0424 08/13/20 0410 08/23/20 0000 08/30/20 0000 09/06/20 0000 09/15/20 0824  AST 143* 94*   < > 32 24 31  ALT 50* 43   < > 14 15 18   ALKPHOS 98 91   < > 90 89 92  BILITOT 1.6* 1.3*  --   --   --  1.7*  PROT 5.2* 5.3*  --   --   --  5.8*  ALBUMIN 2.6* 2.6*   < > 2.7* 2.9* 3.1*   < > = values in this interval not displayed.   Recent Labs    08/13/20 0410 08/14/20 0535 08/23/20 0000 09/15/20 0824 11/25/20 1131  WBC 3.5* 4.4 3.4 5.4 12.1*  NEUTROABS 2.3  --  2,213.00 3.8  --   HGB 9.7* 9.3* 9.2* 10.1* 7.8*  HCT 29.5* 29.4* 29* 32.3* 25.5*  MCV 92.2 93.9  --  88.7 80.4  PLT 50* 56* 73* 85* 63*   Lab Results  Component Value Date   TSH 1.387 11/25/2020   Lab Results  Component Value Date   HGBA1C 6.4 (H) 08/09/2020   Lab Results  Component Value Date   CHOL 112 11/09/2019   HDL 46 (L) 11/09/2019   LDLCALC 52 11/09/2019   TRIG 61 11/09/2019   CHOLHDL 2.4 11/09/2019    Significant Diagnostic Results in last 30 days:  No results found.  Assessment/Plan Pressure ulcer of left buttock, stage 2 (HCC) Size of a quarter, superficial skin missing, will apply Hydrocolloid dressing every 3 days and prn, the patient is encouraged to reposition self frequently for pressure reduction to the area. Observe.   Pleural effusion on right CT neck 11/25/20 showed a R large pleural effusion, not new, declined thoracentesis or further workups.   Bilateral leg edema better, on Spironolactone, Torsemide, Bun/creat 15/1.2 20/2/54  Chronic diastolic (congestive) heart failure (HCC) Dependent edema, mainly in BLE, sometimes R arm 2/2 to the patient  habitual laying on the right side. Continue  Torsemide, Spironolactone.   Hyperglycemia due to type 2 diabetes mellitus (HCC)  Hgb a1c 6.4 08/09/20, on Metformin, Januvia, insulin   Chronic atrial fibrillation (HCC) Heart rate is in control, chronic, takes Metoprolol  Hypothyroidism TSH 1.387 11/25/20, on Levothyroxine  Anemia in chronic kidney disease f/u Hematology, on iron infusion. Hgb 7.8 11/25/20, iron 50 2/70/62  Alcoholic cirrhosis of liver with ascites ([place])  Alcoholic cirrhosis with ascites, on Spironolactone, folic acid. Under Hospice service.   Slow transit constipation Stable, takes Senokot S, MiraLax.     GERD (gastroesophageal reflux disease) Stable, continue Pantoprazole.   Hyponatremia  Na 134 11/25/20    Family/ staff Communication: plan of care reviewed with the patient and charge nurse.   Labs/tests ordered:  none  Time spend 40 minutes.

## 2021-01-02 NOTE — Assessment & Plan Note (Signed)
TSH 1.387 11/25/20, on Levothyroxine

## 2021-01-02 NOTE — Assessment & Plan Note (Signed)
Size of a quarter, superficial skin missing, will apply Hydrocolloid dressing every 3 days and prn, the patient is encouraged to reposition self frequently for pressure reduction to the area. Observe.

## 2021-01-02 NOTE — Assessment & Plan Note (Signed)
CT neck 11/25/20 showed a R large pleural effusion, not new, declined thoracentesis or further workups.

## 2021-01-02 NOTE — Assessment & Plan Note (Signed)
Na 134 11/25/20

## 2021-01-02 NOTE — Assessment & Plan Note (Signed)
f/u Hematology, on iron infusion. Hgb 7.8 11/25/20, iron 50 09/15/20

## 2021-01-02 NOTE — Assessment & Plan Note (Signed)
Dependent edema, mainly in BLE, sometimes R arm 2/2 to the patient habitual laying on the right side. Continue  Torsemide, Spironolactone.

## 2021-01-02 NOTE — Assessment & Plan Note (Signed)
Heart rate is in control, chronic, takes Metoprolol

## 2021-01-02 NOTE — Assessment & Plan Note (Signed)
Hgb a1c 6.4 08/09/20, on Metformin, Januvia, insulin

## 2021-01-02 NOTE — Assessment & Plan Note (Signed)
better, on Spironolactone, Torsemide, Bun/creat 15/1.2 11/25/20

## 2021-01-02 NOTE — Assessment & Plan Note (Signed)
Stable, takes Senokot S, MiraLax. 

## 2021-01-02 NOTE — Assessment & Plan Note (Signed)
Stable, continue Pantoprazole.  

## 2021-01-06 ENCOUNTER — Encounter: Payer: Self-pay | Admitting: Nurse Practitioner

## 2021-01-24 ENCOUNTER — Encounter: Payer: Self-pay | Admitting: Nurse Practitioner

## 2021-01-24 ENCOUNTER — Non-Acute Institutional Stay (SKILLED_NURSING_FACILITY): Payer: HMO | Admitting: Nurse Practitioner

## 2021-01-24 DIAGNOSIS — S81812A Laceration without foreign body, left lower leg, initial encounter: Secondary | ICD-10-CM | POA: Diagnosis not present

## 2021-01-24 DIAGNOSIS — R296 Repeated falls: Secondary | ICD-10-CM

## 2021-01-24 DIAGNOSIS — I482 Chronic atrial fibrillation, unspecified: Secondary | ICD-10-CM

## 2021-01-24 DIAGNOSIS — J90 Pleural effusion, not elsewhere classified: Secondary | ICD-10-CM

## 2021-01-24 DIAGNOSIS — K5901 Slow transit constipation: Secondary | ICD-10-CM

## 2021-01-24 DIAGNOSIS — E871 Hypo-osmolality and hyponatremia: Secondary | ICD-10-CM

## 2021-01-24 DIAGNOSIS — K7031 Alcoholic cirrhosis of liver with ascites: Secondary | ICD-10-CM

## 2021-01-24 DIAGNOSIS — E1165 Type 2 diabetes mellitus with hyperglycemia: Secondary | ICD-10-CM

## 2021-01-24 DIAGNOSIS — L89322 Pressure ulcer of left buttock, stage 2: Secondary | ICD-10-CM

## 2021-01-24 DIAGNOSIS — Z794 Long term (current) use of insulin: Secondary | ICD-10-CM

## 2021-01-24 DIAGNOSIS — D500 Iron deficiency anemia secondary to blood loss (chronic): Secondary | ICD-10-CM

## 2021-01-24 DIAGNOSIS — I5032 Chronic diastolic (congestive) heart failure: Secondary | ICD-10-CM

## 2021-01-24 DIAGNOSIS — F419 Anxiety disorder, unspecified: Secondary | ICD-10-CM

## 2021-01-24 DIAGNOSIS — E039 Hypothyroidism, unspecified: Secondary | ICD-10-CM

## 2021-01-24 NOTE — Progress Notes (Signed)
Location:   [place] Room Number: [place]:  [id number] [phone]) Provider:  Elliott Quade, Lennie Odor NP   Virgie Dad, MD  Patient Care Team: Virgie Dad, MD as PCP - General (Internal Medicine) Berniece Salines, DO as PCP - Cardiology (Cardiology) Sheryn Bison, MD as Referring Physician (Dermatology) Gatha Mayer, MD as Consulting Physician (Gastroenterology) Marin Olp Rudell Cobb, MD as Consulting Physician (Oncology) Janan Ridge, MD as Consulting Physician (Dermatology) Paralee Cancel, MD as Consulting Physician (Orthopedic Surgery) Jacelyn Pi, MD as Consulting Physician (Endocrinology) Day, Melvenia Beam, [facility] (Inactive) as Pharmacist (Pharmacist) Berniece Salines, DO as Consulting Physician (Cardiology)  Extended Emergency Contact Information Primary Emergency Contact: Trinidad Curet [place] Phone: [phone] Mobile Phone: [phone] Relation: Son Secondary Emergency Contact: Betzy, [place] Mobile Phone: [phone] Relation: Relative Preferred language: English Interpreter needed? No  Code Status:  DNR Hospice Managed Care Goals of care: Advanced Directive information Advanced Directives 01/24/2021  Does Patient Have a Medical Advance Directive? Yes  Type of Advance Directive Living will;Out of facility DNR (pink MOST or yellow form)  Does patient want to make changes to medical advance directive? No - Patient declined  Copy of [place] in Chart? Yes - validated most recent copy scanned in chart (See row information)  Would patient like information on creating a medical advance directive? -  Pre-existing out of facility DNR order (yellow form or pink MOST form) Yellow form placed in chart (order not valid for inpatient use)     Chief Complaint  Patient presents with   Acute Visit    fall, skin tear LLE    HPI:  Pt is a 88 y.o. female seen today for an acute visit for fall when the patient assisted to  step up on the scale for weight, resulted a large skin tear left shin.     Pressure ulcer L buttock, stage 2, no s/s of infection.               CT neck 11/25/20 showed a R large pleural effusion, not new, declined thoracentesis or further workups.               BLE edema, better, on Spironolactone, Torsemide, Bun/creat 15/1.2 11/25/20             Chronic diastolic CHF, on Torsemide, Spironolactone.              T2DM, Hgb a1c 6.4 08/09/20, on Metformin, Januvia, insulin              Afib/tachycardia, chronic, takes Metoprolol             Hypothyroidism, TSH 1.387 11/25/20, on Levothyroxine             Anemia, f/u Hematology, on iron infusion. Hgb 7.8 11/25/20, iron 50 09/15/20             Alcoholic cirrhosis with ascites, on Spironolactone, folic acid. Under Hospice service.              Constipation, takes Senokot S, MiraLax.                    GERD, takes Pantoprazole             Anxiety, takes Lorazepam, Lexapro.              Hyponatremia, Na 134 11/25/20  Past Medical History:  Diagnosis Date   Anemia    Anemia of chronic renal  failure, stage 3 (moderate) ([place]) 03/24/2020   Angiodysplasia of ascending colon 10/25/2014   Anxiety    Arthritis    Borderline diabetes    Cancer of the skin, basal cell 09/03/2012   Cirrhosis ([place])    Diabetes mellitus without complication ([place])    Diverticular disease    GAVE (gastric antral vascular ectasia) 09/09/2017   GERD (gastroesophageal reflux disease)    Hypertension    Iron deficiency anemia due to chronic blood loss 01/19/2015   Portal hypertensive gastropathy ([place]) 08/02/2016   ? Some GAVE also   Thyroid disease    Past Surgical History:  Procedure Laterality Date   ABDOMINAL HYSTERECTOMY     APPENDECTOMY     COLONOSCOPY     ESOPHAGOGASTRODUODENOSCOPY     ESOPHAGOGASTRODUODENOSCOPY (EGD) WITH PROPOFOL N/A 02/22/2020   Procedure: ESOPHAGOGASTRODUODENOSCOPY (EGD) WITH PROPOFOL;  Surgeon: Gatha Mayer, MD;  Location: [facility]  ENDOSCOPY;  Service: Endoscopy;  Laterality: N/A;   IR PARACENTESIS  05/25/2016   IR THORACENTESIS ASP PLEURAL SPACE W/IMG GUIDE  02/23/2020   LEG SKIN LESION  BIOPSY / EXCISION  12/11/14   MOHS SURGERY     ankle   TONSILLECTOMY     TOTAL HIP ARTHROPLASTY Bilateral 1993, 2006   UPPER GASTROINTESTINAL ENDOSCOPY  09/09/2017    Allergies  Allergen Reactions   Tizanidine Other (See Comments)    Hallucinate, confused     Allergies as of 01/24/2021       Reactions   Tizanidine Other (See Comments)   Hallucinate, confused         Medication List        Accurate as of January 24, 2021 11:59 PM. If you have any questions, ask your nurse or doctor.          acetaminophen 325 MG tablet Commonly known as: TYLENOL Take 650 mg by mouth every 6 (six) hours as needed.   escitalopram 10 MG tablet Commonly known as: LEXAPRO Take 10 mg by mouth daily.   folic acid 1 MG tablet Commonly known as: FOLVITE Take 1 tablet (1 mg total) by mouth daily.   insulin glargine 100 UNIT/ML injection Commonly known as: LANTUS Inject 30 Units into the skin at bedtime. What changed: Another medication with the same name was removed. Continue taking this medication, and follow the directions you see here. Changed by: Gurveer Colucci X Catrice Zuleta, NP   insulin glargine 100 UNIT/ML injection Commonly known as: LANTUS Inject 0.22 mLs (22 Units total) into the skin every morning. What changed: Another medication with the same name was removed. Continue taking this medication, and follow the directions you see here. Changed by: Rosalind Guido X Natilee Gauer, NP   lactulose 10 GM/15ML solution Commonly known as: CHRONULAC Take by mouth daily. 30 ml   latanoprost 0.005 % ophthalmic solution Commonly known as: XALATAN Place 1 drop into both eyes at bedtime.   levothyroxine 75 MCG tablet Commonly known as: SYNTHROID Take 1 tablet (75 mcg total) by mouth daily.   LORazepam 0.5 MG tablet Commonly known as: ATIVAN Take 1 tablet  (0.5 mg total) by mouth at bedtime.   metFORMIN 500 MG 24 hr tablet Commonly known as: GLUCOPHAGE-XR Take 1 tablet (500 mg total) by mouth daily.   metoprolol succinate 25 MG 24 hr tablet Commonly known as: TOPROL-XL Take 12.5 mg by mouth daily.   pantoprazole 40 MG tablet Commonly known as: PROTONIX Take 1 tablet (40 mg total) by mouth daily.   potassium chloride 10 MEQ CR capsule Commonly  known as: MICRO-K Take 10 mEq by mouth daily.   potassium chloride SA 20 MEQ tablet Commonly known as: KLOR-CON M Take 1 tablet (20 mEq total) by mouth daily.   senna-docusate 8.6-50 MG tablet Commonly known as: Senokot-S Take 1 tablet by mouth 2 (two) times daily.   simvastatin 20 MG tablet Commonly known as: ZOCOR TAKE ONE TABLET BY MOUTH EVERY NIGHT AT BEDTIME   sitaGLIPtin 50 MG tablet Commonly known as: JANUVIA Take 50 mg by mouth daily.   spironolactone 25 MG tablet Commonly known as: Aldactone Take 1 tablet (25 mg total) by mouth daily.   torsemide 20 MG tablet Commonly known as: DEMADEX Take 20 mg by mouth once.   torsemide 10 MG tablet Commonly known as: DEMADEX Take 10 mg by mouth every evening.   vitamin C 1000 MG tablet Take 1,000 mg by mouth at bedtime.   vitamin E 180 MG (400 UNITS) capsule Take 400 Units by mouth every evening.        Review of Systems  Constitutional:  Positive for fatigue and unexpected weight change. Negative for appetite change and fever.       About #8Ibs weight loss in the past month.   HENT:  Positive for hearing loss. Negative for congestion and trouble swallowing.   Eyes:  Negative for visual disturbance.  Respiratory:  Positive for shortness of breath. Negative for cough.        DOE  Cardiovascular:  Positive for leg swelling. Negative for chest pain and palpitations.  Gastrointestinal:  Negative for abdominal pain and constipation.  Genitourinary:  Negative for difficulty urinating, dysuria and urgency.  Musculoskeletal:   Positive for arthralgias. Negative for gait problem.  Skin:  Positive for wound.       Skin tear left shin.   Neurological:  Negative for speech difficulty, weakness and light-headedness.  Psychiatric/Behavioral:  Negative for behavioral problems and sleep disturbance. The patient is not nervous/anxious.    Immunization History  Administered Date(s) Administered   Fluad Quad(high Dose 65+) 10/27/2018, 02/09/2020   Hepatitis B, adult 05/12/2014   Hepatitis B, ped/adol 08/10/2014   Influenza Split 11/02/2009, 10/15/2010   Influenza, High Dose Seasonal PF 10/13/2014, 01/26/2017   Influenza, Quadrivalent, Recombinant, Inj, Pf 09/30/2017   Influenza, Seasonal, Injecte, Preservative Fre 10/13/2014   Influenza,inj,quad, With Preservative 11/22/2016   Influenza-Unspecified 11/25/2013, 10/06/2015, 10/22/2017, 11/10/2020   Moderna Sars-Covid-2 Vaccination 03/26/2019, 04/23/2019, 06/21/2020   PFIZER(Purple Top)SARS-COV-2 Vaccination 11/21/2019   Pfizer Covid-19 Vaccine Bivalent Booster 76yrs & up 10/11/2020   Pneumococcal Conjugate-13 05/17/2014   Pneumococcal Polysaccharide-23 10/06/2015   Tdap 10/02/2011, 11/06/2016, 11/25/2020   Zoster, Live 01/22/2005   Pertinent  Health Maintenance Due  Topic Date Due   FOOT EXAM  05/12/2015   URINE MICROALBUMIN  04/27/2020   OPHTHALMOLOGY EXAM  05/17/2020   HEMOGLOBIN A1C  02/09/2021   INFLUENZA VACCINE  Completed   DEXA SCAN  Completed   COLONOSCOPY (Pts 45-58yrs Insurance coverage will need to be confirmed)  Discontinued   Fall Risk 08/14/2020 08/15/2020 08/15/2020 09/15/2020 11/25/2020  Falls in the past year? - - - - -  Was there an injury with Fall? - - - - -  Was there an injury with Fall? - - - - -  Fall Risk Category Calculator - - - - -  Fall Risk Category - - - - -  Patient Fall Risk Level High fall risk High fall risk High fall risk High fall risk High fall risk  Patient at Risk  for Falls Due to - - - - -  Fall risk  Follow up - - - - -   Functional Status Survey:    Vitals:   01/24/21 1517  BP: (!) 110/50  Pulse: (!) 109  Resp: 19  Temp: (!) 97.5 F (36.4 C)  SpO2: 91%  Weight: 177 lb (80.3 kg)  Height: 5\' 7"  (1.702 m)   Body mass index is 27.72 kg/m. Physical Exam Vitals and nursing note reviewed.  Constitutional:      Comments: Appears tired.   HENT:     Head: Normocephalic and atraumatic.     Mouth/Throat:     Mouth: Mucous membranes are moist.  Eyes:     Extraocular Movements: Extraocular movements intact.     Conjunctiva/sclera: Conjunctivae normal.     Pupils: Pupils are equal, round, and reactive to light.  Cardiovascular:     Rate and Rhythm: Normal rate.     Heart sounds: No murmur heard. Pulmonary:     Effort: Pulmonary effort is normal.     Breath sounds: No wheezing, rhonchi or rales.     Comments: bibasilar Abdominal:     General: Bowel sounds are normal.     Palpations: Abdomen is soft.     Tenderness: There is no abdominal tenderness.  Musculoskeletal:     Cervical back: Normal range of motion and neck supple.     Right lower leg: Edema present.     Left lower leg: Edema present.     Comments: Dependent edema 1+  Skin:    General: Skin is warm and dry.     Comments: Venous insufficiency skin changes BLE. A large skin tear left shin sustained from the fall. L buttock pressure ulcer is slow healing, no s/s of infection.   Neurological:     General: No focal deficit present.     Mental Status: She is alert. Mental status is at baseline.     Gait: Gait abnormal.     Comments: Oriented to person, place.   Psychiatric:        Mood and Affect: Mood normal.        Behavior: Behavior normal.        Thought Content: Thought content normal.    Labs reviewed: Recent Labs    08/13/20 0410 08/14/20 0535 08/15/20 0421 08/23/20 0000 09/09/20 0000 09/15/20 0824 11/25/20 0818  NA 133* 134* 134*   < > 138 136 134*  K 3.8 3.6 3.8   < > 3.5 4.1 3.6  CL 100 105  103   < > 103 100 99  CO2 25 25 25    < > 24* 27 24  GLUCOSE 247* 168* 186*  --   --  241* 262*  BUN 13 14 15    < > 18 15 15   CREATININE 0.82 0.83 0.97   < > 1.3* 1.14* 1.20*  CALCIUM 8.7* 8.5* 8.3*   < > 8.5* 9.2 8.6*  MG 1.8 1.9  --   --   --   --  1.7   < > = values in this interval not displayed.   Recent Labs    08/12/20 0424 08/13/20 0410 08/23/20 0000 08/30/20 0000 09/06/20 0000 09/15/20 0824  AST 143* 94*   < > 32 24 31  ALT 50* 43   < > 14 15 18   ALKPHOS 98 91   < > 90 89 92  BILITOT 1.6* 1.3*  --   --   --  1.7*  PROT 5.2* 5.3*  --   --   --  5.8*  ALBUMIN 2.6* 2.6*   < > 2.7* 2.9* 3.1*   < > = values in this interval not displayed.   Recent Labs    08/13/20 0410 08/14/20 0535 08/23/20 0000 09/15/20 0824 11/25/20 1131  WBC 3.5* 4.4 3.4 5.4 12.1*  NEUTROABS 2.3  --  2,213.00 3.8  --   HGB 9.7* 9.3* 9.2* 10.1* 7.8*  HCT 29.5* 29.4* 29* 32.3* 25.5*  MCV 92.2 93.9  --  88.7 80.4  PLT 50* 56* 73* 85* 63*   Lab Results  Component Value Date   TSH 1.387 11/25/2020   Lab Results  Component Value Date   HGBA1C 6.4 (H) 08/09/2020   Lab Results  Component Value Date   CHOL 112 11/09/2019   HDL 46 (L) 11/09/2019   LDLCALC 52 11/09/2019   TRIG 61 11/09/2019   CHOLHDL 2.4 11/09/2019    Significant Diagnostic Results in last 30 days:  No results found.  Assessment/Plan Skin tear of left lower leg without complication  fall when the patient assisted to step up on the scale for weight, resulted a large skin tear left shin. Continue non adhesive dressing daily.   Multiple falls Increased frailty is contributory, close supervision/assistance needed for safety.   Pressure ulcer of left buttock, stage 2 (HCC) Pressure ulcer L buttock, stage 2, no s/s of infection.   Pleural effusion on right CT neck 11/25/20 showed a R large pleural effusion, not new, declined thoracentesis or further workups.   Chronic diastolic (congestive) heart failure (HCC)  better, on  Spironolactone, Torsemide, Bun/creat 15/1.2 11/25/20  Hyperglycemia due to type 2 diabetes mellitus (HCC) Hgb a1c 6.4 08/09/20, on Metformin, Januvia, insulin   Chronic atrial fibrillation (HCC) Heart rate 100s,blood pressure runs low, takes Metoprolol.   Adult hypothyroidism TSH 1.387 11/25/20, on Levothyroxine  Anemia  f/u Hematology, on iron infusion. Hgb 7.8 11/25/20, iron 50 0/92/33  Alcoholic cirrhosis of liver with ascites ([place]) Alcoholic cirrhosis with ascites, on Spironolactone, folic acid. Under Hospice service.   Slow transit constipation Stable,  takes Senokot S, MiraLax.    Anxiety Managed, , takes Lorazepam, Lexapro.   Hyponatremia  Na 134 11/25/20     Family/ staff Communication: plan of care reviewed with the patient and charge nurse.   Labs/tests ordered:   none  Time spend 40 minutes.

## 2021-01-26 ENCOUNTER — Encounter: Payer: Self-pay | Admitting: Nurse Practitioner

## 2021-01-26 DIAGNOSIS — S81812A Laceration without foreign body, left lower leg, initial encounter: Secondary | ICD-10-CM | POA: Insufficient documentation

## 2021-01-26 NOTE — Assessment & Plan Note (Signed)
Pressure ulcer L buttock, stage 2, no s/s of infection.

## 2021-01-26 NOTE — Assessment & Plan Note (Signed)
TSH 1.387 11/25/20, on Levothyroxine

## 2021-01-26 NOTE — Assessment & Plan Note (Signed)
Alcoholic cirrhosis with ascites, on Spironolactone, folic acid. Under Hospice service.

## 2021-01-26 NOTE — Assessment & Plan Note (Signed)
fall when the patient assisted to step up on the scale for weight, resulted a large skin tear left shin. Continue non adhesive dressing daily.

## 2021-01-26 NOTE — Assessment & Plan Note (Signed)
Stable, takes Senokot S, MiraLax. 

## 2021-01-26 NOTE — Assessment & Plan Note (Signed)
Na 134 11/25/20

## 2021-01-26 NOTE — Assessment & Plan Note (Addendum)
Heart rate 100s,blood pressure runs low, takes Metoprolol.

## 2021-01-26 NOTE — Assessment & Plan Note (Signed)
Managed, , takes Lorazepam, Lexapro.

## 2021-01-26 NOTE — Assessment & Plan Note (Signed)
better, on Spironolactone, Torsemide, Bun/creat 15/1.2 11/25/20

## 2021-01-26 NOTE — Assessment & Plan Note (Signed)
Hgb a1c 6.4 08/09/20, on Metformin, Januvia, insulin

## 2021-01-26 NOTE — Assessment & Plan Note (Signed)
CT neck 11/25/20 showed a R large pleural effusion, not new, declined thoracentesis or further workups.

## 2021-01-26 NOTE — Assessment & Plan Note (Signed)
f/u Hematology, on iron infusion. Hgb 7.8 11/25/20, iron 50 09/15/20

## 2021-01-26 NOTE — Assessment & Plan Note (Signed)
Increased frailty is contributory, close supervision/assistance needed for safety.

## 2021-01-31 ENCOUNTER — Encounter: Payer: Self-pay | Admitting: Internal Medicine

## 2021-01-31 ENCOUNTER — Non-Acute Institutional Stay (SKILLED_NURSING_FACILITY): Admitting: Internal Medicine

## 2021-01-31 DIAGNOSIS — I5032 Chronic diastolic (congestive) heart failure: Secondary | ICD-10-CM | POA: Diagnosis not present

## 2021-01-31 DIAGNOSIS — K7031 Alcoholic cirrhosis of liver with ascites: Secondary | ICD-10-CM

## 2021-01-31 DIAGNOSIS — E1165 Type 2 diabetes mellitus with hyperglycemia: Secondary | ICD-10-CM | POA: Diagnosis not present

## 2021-01-31 DIAGNOSIS — S81812A Laceration without foreign body, left lower leg, initial encounter: Secondary | ICD-10-CM | POA: Diagnosis not present

## 2021-01-31 DIAGNOSIS — D500 Iron deficiency anemia secondary to blood loss (chronic): Secondary | ICD-10-CM

## 2021-01-31 DIAGNOSIS — R296 Repeated falls: Secondary | ICD-10-CM

## 2021-01-31 DIAGNOSIS — Z794 Long term (current) use of insulin: Secondary | ICD-10-CM

## 2021-01-31 NOTE — Progress Notes (Signed)
Provider:  Veleta Miners MD Location:   [place] Room Number: 40 Place of Service:  SNF (31)  PCP: Virgie Dad, MD Patient Care Team: Virgie Dad, MD as PCP - General (Internal Medicine) Berniece Salines, DO as PCP - Cardiology (Cardiology) Sheryn Bison, MD as Referring Physician (Dermatology) Gatha Mayer, MD as Consulting Physician (Gastroenterology) Marin Olp Rudell [place], MD as Consulting Physician (Oncology) Janan Ridge, MD as Consulting Physician (Dermatology) Paralee Cancel, MD as Consulting Physician (Orthopedic Surgery) Jacelyn Pi, MD as Consulting Physician (Endocrinology) Day, Melvenia Beam, [facility] (Inactive) as Pharmacist (Pharmacist) Berniece Salines, DO as Consulting Physician (Cardiology)  Extended Emergency Contact Information Primary Emergency Contact: Trinidad Curet [place] Phone: [phone] Mobile Phone: [phone] Relation: Son Secondary Emergency Contact: [place], [place] Mobile Phone: [phone] Relation: Relative Preferred language: English Interpreter needed? No  Code Status: DNR Managed Care Goals of Care: Advanced Directive information Advanced Directives 01/31/2021  Does Patient Have a Medical Advance Directive? Yes  Type of Advance Directive Living will;Out of facility DNR (pink MOST or yellow form)  Does patient want to make changes to medical advance directive? No - Patient declined  Copy of [place] in Chart? Yes - validated most recent copy scanned in chart (See row information)  Would patient like information on creating a medical advance directive? -  Pre-existing out of facility DNR order (yellow form or pink MOST form) Yellow form placed in chart (order not valid for inpatient use)      Chief Complaint  Patient presents with   Readmit To SNF    Readmission to SNF    HPI: Patient is a 88 y.o. female seen today for admission to SNF under Hospice   Has h/o diabetes  mellitus type 2 with neuropathy,  history of alcoholic liver cirrhosis, Portal hypertensive gastropathy s/p EGD Diastolic CHF with Recurent Pleural Effusion and Iron Def Anemia Anemia and thrombocytopenia due to cirrhosis and splenomegaly History of hypertension and hypothyroidism  Patient was in AL but was getting weaker and unable to do her ADLs.  Patient was also having multiple falls.  Usually gets dizzy when stands up. Wt Readings from Last 3 Encounters:  01/31/21 189 lb (85.7 kg)  01/24/21 177 lb (80.3 kg)  01/02/21 185 lb (83.9 kg)  Seen in her room today.  Patient's family does not want any aggressive measures.  Patient seems very weak.  Denied any acute complaints per nurses she is needing assist with her ADLs.  She is not walking anymore.  Usually staying in bed. Eating 25% of meals Past Medical History:  Diagnosis Date   Anemia    Anemia of chronic renal failure, stage 3 (moderate) ([place]) 03/24/2020   Angiodysplasia of ascending colon 10/25/2014   Anxiety    Arthritis    Borderline diabetes    Cancer of the skin, basal cell 09/03/2012   Cirrhosis ([place])    Diabetes mellitus without complication ([place])    Diverticular disease    GAVE (gastric antral vascular ectasia) 09/09/2017   GERD (gastroesophageal reflux disease)    Hypertension    Iron deficiency anemia due to chronic blood loss 01/19/2015   Portal hypertensive gastropathy ([place]) 08/02/2016   ? Some GAVE also   Thyroid disease    Past Surgical History:  Procedure Laterality Date   ABDOMINAL HYSTERECTOMY     APPENDECTOMY     COLONOSCOPY     ESOPHAGOGASTRODUODENOSCOPY     ESOPHAGOGASTRODUODENOSCOPY (EGD) WITH PROPOFOL N/A 02/22/2020  Procedure: ESOPHAGOGASTRODUODENOSCOPY (EGD) WITH PROPOFOL;  Surgeon: Gatha Mayer, MD;  Location: [facility];  Service: Endoscopy;  Laterality: N/A;   IR PARACENTESIS  05/25/2016   IR THORACENTESIS ASP PLEURAL SPACE W/IMG GUIDE  02/23/2020   LEG SKIN LESION  BIOPSY / EXCISION  12/11/14    MOHS SURGERY     ankle   TONSILLECTOMY     TOTAL HIP ARTHROPLASTY Bilateral 1993, 2006   UPPER GASTROINTESTINAL ENDOSCOPY  09/09/2017    reports that she has never smoked. She has never used smokeless tobacco. She reports current alcohol use. She reports that she does not use drugs. Social History   Socioeconomic History   Marital status: Divorced    Spouse name: Not on file   Number of children: 2   Years of education: Not on file   Highest education level: Not on file  Occupational History   Occupation: retired  Tobacco Use   Smoking status: Never   Smokeless tobacco: Never  Vaping Use   Vaping Use: Never used  Substance and Sexual Activity   Alcohol use: Yes    Comment: weekly   Drug use: No   Sexual activity: Not on file  Other Topics Concern   Not on file  Social History Narrative   The patient is divorced. She is originally from [place]. She has 2 sons. 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   08/10/2014   Social Determinants of Health   Financial Resource Strain: Not on file  Food Insecurity: Not on file  Transportation Needs: Not on file  Physical Activity: Not on file  Stress: Not on file  Social Connections: Not on file  Intimate Partner Violence: Not on file    Functional Status Survey:    Family History  Problem Relation Age of Onset   Bladder Cancer Father    Heart attack Father    Diabetes Mother    Colon cancer Neg Hx    Colon polyps Neg Hx    Esophageal cancer Neg Hx    Rectal cancer Neg Hx    Stomach cancer Neg Hx     Health Maintenance  Topic Date Due   Zoster Vaccines- Shingrix (1 of 2) Never done   FOOT EXAM  05/12/2015   URINE MICROALBUMIN  04/27/2020   OPHTHALMOLOGY EXAM  05/17/2020   HEMOGLOBIN A1C  02/09/2021   TETANUS/TDAP  11/26/2030   Pneumonia Vaccine 49+ Years old  Completed   INFLUENZA VACCINE  Completed   DEXA SCAN  Completed   COVID-19 Vaccine  Completed   HPV VACCINES  Aged Out    COLONOSCOPY (Pts 45-43yrs Insurance coverage will need to be confirmed)  Discontinued    Allergies  Allergen Reactions   Tizanidine Other (See Comments)    Hallucinate, confused     Allergies as of 01/31/2021       Reactions   Tizanidine Other (See Comments)   Hallucinate, confused         Medication List        Accurate as of January 31, 2021 11:59 PM. If you have any questions, ask your nurse or doctor.          acetaminophen 325 MG tablet Commonly known as: TYLENOL Take 650 mg by mouth every 6 (six) hours as needed.   escitalopram 10 MG tablet Commonly known as: LEXAPRO Take 10 mg by mouth daily.   folic acid 1 MG tablet Commonly known as: FOLVITE Take 1 tablet (1 mg  total) by mouth daily.   insulin glargine 100 UNIT/ML injection Commonly known as: LANTUS Inject 30 Units into the skin at bedtime.   insulin glargine 100 UNIT/ML injection Commonly known as: LANTUS Inject 0.22 mLs (22 Units total) into the skin every morning.   lactulose 10 GM/15ML solution Commonly known as: CHRONULAC Take by mouth daily. 30 ml   latanoprost 0.005 % ophthalmic solution Commonly known as: XALATAN Place 1 drop into both eyes at bedtime.   levothyroxine 75 MCG tablet Commonly known as: SYNTHROID Take 1 tablet (75 mcg total) by mouth daily.   LORazepam 0.5 MG tablet Commonly known as: ATIVAN Take 1 tablet (0.5 mg total) by mouth at bedtime.   metFORMIN 500 MG 24 hr tablet Commonly known as: GLUCOPHAGE-XR Take 1 tablet (500 mg total) by mouth daily.   metoprolol succinate 25 MG 24 hr tablet Commonly known as: TOPROL-XL Take 12.5 mg by mouth daily.   pantoprazole 40 MG tablet Commonly known as: PROTONIX Take 1 tablet (40 mg total) by mouth daily.   potassium chloride 10 MEQ CR capsule Commonly known as: MICRO-K Take 10 mEq by mouth daily.   potassium chloride SA 20 MEQ tablet Commonly known as: KLOR-CON M Take 1 tablet (20 mEq total) by mouth daily.    senna-docusate 8.6-50 MG tablet Commonly known as: Senokot-S Take 1 tablet by mouth 2 (two) times daily.   simvastatin 20 MG tablet Commonly known as: ZOCOR TAKE ONE TABLET BY MOUTH EVERY NIGHT AT BEDTIME   sitaGLIPtin 50 MG tablet Commonly known as: JANUVIA Take 50 mg by mouth daily.   spironolactone 25 MG tablet Commonly known as: Aldactone Take 1 tablet (25 mg total) by mouth daily.   torsemide 20 MG tablet Commonly known as: DEMADEX Take 20 mg by mouth once.   torsemide 10 MG tablet Commonly known as: DEMADEX Take 10 mg by mouth every evening.   vitamin C 1000 MG tablet Take 1,000 mg by mouth at bedtime.   vitamin E 180 MG (400 UNITS) capsule Take 400 Units by mouth every evening.        Review of Systems  Constitutional:  Positive for activity change and appetite change.  HENT: Negative.    Respiratory:  Negative for cough and shortness of breath.   Cardiovascular:  Positive for leg swelling.  Gastrointestinal:  Negative for constipation.  Genitourinary: Negative.   Musculoskeletal:  Positive for gait problem. Negative for arthralgias and myalgias.  Skin: Negative.   Neurological:  Positive for dizziness and weakness.  Psychiatric/Behavioral:  Positive for confusion. Negative for dysphoric mood and sleep disturbance.    Vitals:   01/31/21 1429  BP: (!) 102/55  Pulse: 76  Resp: 17  Temp: 98.4 F (36.9 C)  SpO2: 98%  Weight: 189 lb (85.7 kg)  Height: 5\' 7"  (1.702 m)   Body mass index is 29.6 kg/m. Physical Exam Vitals reviewed.  Constitutional:      Comments: Looks more confused   HENT:     Head: Normocephalic.     Nose: Nose normal.     Mouth/Throat:     Mouth: Mucous membranes are moist.     Pharynx: Oropharynx is clear.  Eyes:     Pupils: Pupils are equal, round, and reactive to light.  Cardiovascular:     Rate and Rhythm: Normal rate and regular rhythm.     Pulses: Normal pulses.     Heart sounds: Normal heart sounds. No murmur  heard. Pulmonary:     Effort:  Pulmonary effort is normal.     Breath sounds: Normal breath sounds.  Abdominal:     General: Abdomen is flat. Bowel sounds are normal.     Palpations: Abdomen is soft.  Musculoskeletal:        General: Swelling present.     Cervical back: Neck supple.     Comments: Has a Wound in her LLE which is clear base and Margins with Minimal Discharge. No Necrosis.  Skin:    General: Skin is warm.  Neurological:     General: No focal deficit present.     Mental Status: She is alert and oriented to person, place, and time.  Psychiatric:        Mood and Affect: Mood normal.        Thought Content: Thought content normal.    Labs reviewed: Basic Metabolic Panel: Recent Labs    08/13/20 0410 08/14/20 0535 08/15/20 0421 08/23/20 0000 09/09/20 0000 09/15/20 0824 11/25/20 0818  NA 133* 134* 134*   < > 138 136 134*  K 3.8 3.6 3.8   < > 3.5 4.1 3.6  CL 100 105 103   < > 103 100 99  CO2 25 25 25    < > 24* 27 24  GLUCOSE 247* 168* 186*  --   --  241* 262*  BUN 13 14 15    < > 18 15 15   CREATININE 0.82 0.83 0.97   < > 1.3* 1.14* 1.20*  CALCIUM 8.7* 8.5* 8.3*   < > 8.5* 9.2 8.6*  MG 1.8 1.9  --   --   --   --  1.7   < > = values in this interval not displayed.   Liver Function Tests: Recent Labs    08/12/20 0424 08/13/20 0410 08/23/20 0000 08/30/20 0000 09/06/20 0000 09/15/20 0824  AST 143* 94*   < > 32 24 31  ALT 50* 43   < > 14 15 18   ALKPHOS 98 91   < > 90 89 92  BILITOT 1.6* 1.3*  --   --   --  1.7*  PROT 5.2* 5.3*  --   --   --  5.8*  ALBUMIN 2.6* 2.6*   < > 2.7* 2.9* 3.1*   < > = values in this interval not displayed.   Recent Labs    08/08/20 1748  LIPASE 50   Recent Labs    08/08/20 1750  AMMONIA 31   CBC: Recent Labs    08/13/20 0410 08/14/20 0535 08/23/20 0000 09/15/20 0824 11/25/20 1131  WBC 3.5* 4.4 3.4 5.4 12.1*  NEUTROABS 2.3  --  2,213.00 3.8  --   HGB 9.7* 9.3* 9.2* 10.1* 7.8*  HCT 29.5* 29.4* 29* 32.3* 25.5*   MCV 92.2 93.9  --  88.7 80.4  PLT 50* 56* 73* 85* 63*   Cardiac Enzymes: Recent Labs    11/25/20 0818  CKTOTAL 203   BNP: Invalid input(s): POCBNP Lab Results  Component Value Date   HGBA1C 6.4 (H) 08/09/2020   Lab Results  Component Value Date   TSH 1.387 11/25/2020   Lab Results  Component Value Date   VITAMINB12 1,101 (H) 01/12/2020   Lab Results  Component Value Date   FOLATE 16.6 01/12/2020   Lab Results  Component Value Date   IRON 50 09/15/2020   TIBC 266 09/15/2020   FERRITIN 24 09/15/2020    Imaging and Procedures obtained prior to SNF admission: DG Lumbar Spine Complete  Result Date:  11/25/2020 CLINICAL DATA:  Fall EXAM: LUMBAR SPINE - COMPLETE 4+ VIEW COMPARISON:  None. FINDINGS: There are 5 non-rib-bearing lumbar vertebrae. There is no evidence of lumbar spine fracture. There is multilevel degenerative disc disease, severe at L2-L3 and L4-L5. There is moderate lower lumbar predominant facet arthropathy. Unchanged inferior endplate concavity of L5. IMPRESSION: No evidence of lumbar spine fracture. Multilevel degenerative disc disease, severe at L2-L3 and L4-L5. Moderate lower lumbar predominant facet arthropathy. Electronically Signed   By: Maurine Simmering M.D.   On: 11/25/2020 08:44   CT Head Wo Contrast  Result Date: 11/25/2020 CLINICAL DATA:  Patient fell this morning getting out of bed. Right knee laceration. EXAM: CT HEAD WITHOUT CONTRAST CT CERVICAL SPINE WITHOUT CONTRAST TECHNIQUE: Multidetector CT imaging of the head and cervical spine was performed following the standard protocol without intravenous contrast. Multiplanar CT image reconstructions of the cervical spine were also generated. COMPARISON:  10/16/2019. FINDINGS: CT HEAD FINDINGS Brain: No evidence of acute infarction, hemorrhage, hydrocephalus, extra-axial collection or mass effect. Small mass, projects at the level of the anterior communicating artery, again raising concern for and anterior  communicating artery/A1 segment aneurysm, without change from the prior study. This measures 8 mm in long axis. No other extra-axial abnormalities. Patchy areas of white matter hypoattenuation are noted consistent with mild chronic microvascular ischemic change, stable. Vascular: No hyperdense vessel or unexpected calcification. Skull: Normal. Negative for fracture or focal lesion. Sinuses/Orbits: Globes and orbits are unremarkable. Visualized sinuses are clear. Other: None. CT CERVICAL SPINE FINDINGS Alignment: Straightened cervical lordosis.  No spondylolisthesis. Skull base and vertebrae: No acute fracture. No primary bone lesion or focal pathologic process. Soft tissues and spinal canal: No prevertebral fluid or swelling. No visible canal hematoma. Disc levels: Mild loss of disc height at C3-C4. Moderate to marked loss of disc height from C4-C5 through C6-C7 with endplate spurring and disc bulging. Facet degenerative change noted bilaterally. No convincing disc herniation. No change from the prior cervical CT. Upper chest: No mass or adenopathy. Large right pleural effusion, new since the prior study. Other: None. IMPRESSION: HEAD CT 1. No acute intracranial abnormality. 2. 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 CERVICAL CT 1. No fracture or acute osseous abnormality. 2. Large right pleural effusion new since the prior cervical CT. Electronically Signed   By: Lajean Manes M.D.   On: 11/25/2020 09:16   CT Cervical Spine Wo Contrast  Result Date: 11/25/2020 CLINICAL DATA:  Patient fell this morning getting out of bed. Right knee laceration. EXAM: CT HEAD WITHOUT CONTRAST CT CERVICAL SPINE WITHOUT CONTRAST TECHNIQUE: Multidetector CT imaging of the head and cervical spine was performed following the standard protocol without intravenous contrast. Multiplanar CT image reconstructions of the cervical spine were also  generated. COMPARISON:  10/16/2019. FINDINGS: CT HEAD FINDINGS Brain: No evidence of acute infarction, hemorrhage, hydrocephalus, extra-axial collection or mass effect. Small mass, projects at the level of the anterior communicating artery, again raising concern for and anterior communicating artery/A1 segment aneurysm, without change from the prior study. This measures 8 mm in long axis. No other extra-axial abnormalities. Patchy areas of white matter hypoattenuation are noted consistent with mild chronic microvascular ischemic change, stable. Vascular: No hyperdense vessel or unexpected calcification. Skull: Normal. Negative for fracture or focal lesion. Sinuses/Orbits: Globes and orbits are unremarkable. Visualized sinuses are clear. Other: None. CT CERVICAL SPINE FINDINGS Alignment: Straightened cervical lordosis.  No spondylolisthesis. Skull base and vertebrae: No acute  fracture. No primary bone lesion or focal pathologic process. Soft tissues and spinal canal: No prevertebral fluid or swelling. No visible canal hematoma. Disc levels: Mild loss of disc height at C3-C4. Moderate to marked loss of disc height from C4-C5 through C6-C7 with endplate spurring and disc bulging. Facet degenerative change noted bilaterally. No convincing disc herniation. No change from the prior cervical CT. Upper chest: No mass or adenopathy. Large right pleural effusion, new since the prior study. Other: None. IMPRESSION: HEAD CT 1. No acute intracranial abnormality. 2. 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 CERVICAL CT 1. No fracture or acute osseous abnormality. 2. Large right pleural effusion new since the prior cervical CT. Electronically Signed   By: Lajean Manes M.D.   On: 11/25/2020 09:16   DG Pelvis Comp Min 3V  Result Date: 11/25/2020 CLINICAL DATA:  right knee injury EXAM: [facility] PELVIS - 3+ VIEW COMPARISON:  CT 08/08/2020 FINDINGS:  There is no evidence of acute fracture. There are bilateral total hip arthroplasties. There is superolateral positioning of the left femoral head within the acetabular cup, with lucency underlying the acetabular component, similar appearance to prior CT in July 2022. Mild superolateral positioning of the right femoral head within the acetabular cup as well but to a much lesser degree. There is no significant lucency the adjacent bone. Lower lumbar spine and bilateral SI joint degenerative changes. IMPRESSION: No radiographically evident pelvic fracture. 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 Electronically Signed   By: Maurine Simmering M.D.   On: 11/25/2020 08:42   DG Knee Complete 4 Views Right  Result Date: 11/25/2020 CLINICAL DATA:  Right knee injury EXAM: RIGHT KNEE - COMPLETE 4+ VIEW COMPARISON:  Right knee x-ray 10/16/2019 FINDINGS: No acute fracture or dislocation identified. Mild narrowing of the patellofemoral joint and medial compartment with small marginal osteophytes. Minimal chondrocalcinosis. Bones are osteopenic. Evidence of soft tissue injury at the anterior knee. No radiopaque foreign body identified. IMPRESSION: No acute osseous abnormality identified. Electronically Signed   By: Ofilia Neas M.D.   On: 11/25/2020 08:38    Assessment/Plan Skin tear of left lower leg without complication, initial encounter Continue Xeroform dressing Edema Seems controlled Multiple falls Combination of weakness and poor awareness Forgets and tries to get up from her bed Chronic diastolic (congestive) heart failure (HCC) On Low dose of Torsemide Cannot tolerate high doses Type 2 diabetes mellitus with hyperglycemia, with long-term current use of insulin ([place]) On Januvia,Metformin and Insulin Family does not want aggressive control Patient not eating well  No Sliding scale Adult hypothyroidism TSH normal in 4/27  Alcoholic cirrhosis of  liver with ascites ([place]) On Lactulose and Aldactone  Discussed with Hospice nurse will discontinue Ascorbic Acid, Lexapro and Statin No Blood work Family wishes her to be comfortable    Pharmacist, hospital Communication:   Labs/tests ordered:

## 2021-02-05 ENCOUNTER — Encounter: Payer: Self-pay | Admitting: Internal Medicine

## 2021-02-08 ENCOUNTER — Other Ambulatory Visit: Payer: Self-pay | Admitting: Orthopedic Surgery

## 2021-02-08 DIAGNOSIS — F419 Anxiety disorder, unspecified: Secondary | ICD-10-CM

## 2021-02-08 DIAGNOSIS — Z515 Encounter for palliative care: Secondary | ICD-10-CM

## 2021-02-08 DIAGNOSIS — R0600 Dyspnea, unspecified: Secondary | ICD-10-CM

## 2021-02-08 DIAGNOSIS — R4189 Other symptoms and signs involving cognitive functions and awareness: Secondary | ICD-10-CM

## 2021-02-08 MED ORDER — LORAZEPAM 0.5 MG PO TABS: 0.5000 mg | ORAL_TABLET | ORAL | 0 refills | Status: AC | PRN

## 2021-02-08 MED ORDER — MORPHINE SULFATE (CONCENTRATE) 20 MG/ML PO SOLN: 5.0000 mg | ORAL | 0 refills | Status: AC | PRN

## 2021-02-21 ENCOUNTER — Encounter: Payer: Self-pay | Admitting: Family

## 2021-02-24 ENCOUNTER — Encounter: Payer: Self-pay | Admitting: Nurse Practitioner

## 2021-02-24 ENCOUNTER — Encounter: Payer: Self-pay | Admitting: Family

## 2021-02-24 ENCOUNTER — Non-Acute Institutional Stay (SKILLED_NURSING_FACILITY): Payer: Medicare Other | Admitting: Nurse Practitioner

## 2021-02-24 DIAGNOSIS — R627 Adult failure to thrive: Secondary | ICD-10-CM | POA: Diagnosis not present

## 2021-02-24 DIAGNOSIS — H409 Unspecified glaucoma: Secondary | ICD-10-CM | POA: Diagnosis not present

## 2021-02-24 DIAGNOSIS — F03C0 Unspecified dementia, severe, without behavioral disturbance, psychotic disturbance, mood disturbance, and anxiety: Secondary | ICD-10-CM

## 2021-02-24 DIAGNOSIS — J9690 Respiratory failure, unspecified, unspecified whether with hypoxia or hypercapnia: Secondary | ICD-10-CM | POA: Insufficient documentation

## 2021-02-24 DIAGNOSIS — J9601 Acute respiratory failure with hypoxia: Secondary | ICD-10-CM

## 2021-02-24 NOTE — Assessment & Plan Note (Signed)
unresponsive, tachypnea, O2 desaturation, gurgling sounds from throat, will have prn Atropine ophthalmic sol 1%, 2gtt SL.

## 2021-02-24 NOTE — Assessment & Plan Note (Signed)
Advanced stage.

## 2021-02-24 NOTE — Progress Notes (Signed)
Location:   SNF [place] Room Number: 40 Place of Service:  SNF (31) Provider: [facility] Shakinah Navis NP  Virgie Dad, MD  Patient Care Team: Virgie Dad, MD as PCP - General (Internal Medicine) Berniece Salines, DO as PCP - Cardiology (Cardiology) Sheryn Bison, MD as Referring Physician (Dermatology) Gatha Mayer, MD as Consulting Physician (Gastroenterology) Marin Olp Rudell Cobb, MD as Consulting Physician (Oncology) Janan Ridge, MD as Consulting Physician (Dermatology) Paralee Cancel, MD as Consulting Physician (Orthopedic Surgery) Jacelyn Pi, MD as Consulting Physician (Endocrinology) Day, Melvenia Beam, [facility] (Inactive) as Pharmacist (Pharmacist) Berniece Salines, DO as Consulting Physician (Cardiology)  Extended Emergency Contact Information Primary Emergency Contact: Trinidad Curet [place] Phone: [phone] Mobile Phone: [phone] Relation: Son Secondary Emergency Contact: Oluw[place]isin, [place] Mobile Phone: [phone] Relation: Relative Preferred language: English Interpreter needed? No  Code Status: DNR Goals of care: Advanced Directive information Advanced Directives 02/24/2021  Does Patient Have a Medical Advance Directive? Yes  Type of Advance Directive Living will;Out of facility DNR (pink MOST or yellow form)  Does patient want to make changes to medical advance directive? No - Patient declined  Copy of [place] in Chart? Yes - validated most recent copy scanned in chart (See row information)  Would patient like information on creating a medical advance directive? -  Pre-existing out of facility DNR order (yellow form or pink MOST form) Yellow form placed in chart (order not valid for inpatient use)     Chief Complaint  Patient presents with   Acute Visit    End of life care.    HPI:  Pt is a 88 y.o. female seen today for an acute visit for end of care care, unresponsive, tachypnea, O2 desaturation, gurgling sounds from  throat.  Adult Failure to thrive, comfort measures, under Hospice service, Morphine, Lorazepam available to her.   Dementia, advanced stage.   Glaucoma, Latanoprost for comfort measures.    Past Medical History:  Diagnosis Date   Anemia    Anemia of chronic renal failure, stage 3 (moderate) ([place]) 03/24/2020   Angiodysplasia of ascending colon 10/25/2014   Anxiety    Arthritis    Borderline diabetes    Cancer of the skin, basal cell 09/03/2012   Cirrhosis ([place])    Diabetes mellitus without complication ([place])    Diverticular disease    GAVE (gastric antral vascular ectasia) 09/09/2017   GERD (gastroesophageal reflux disease)    Hypertension    Iron deficiency anemia due to chronic blood loss 01/19/2015   Portal hypertensive gastropathy ([place]) 08/02/2016   ? Some GAVE also   Thyroid disease    Past Surgical History:  Procedure Laterality Date   ABDOMINAL HYSTERECTOMY     APPENDECTOMY     COLONOSCOPY     ESOPHAGOGASTRODUODENOSCOPY     ESOPHAGOGASTRODUODENOSCOPY (EGD) WITH PROPOFOL N/A 02/22/2020   Procedure: ESOPHAGOGASTRODUODENOSCOPY (EGD) WITH PROPOFOL;  Surgeon: Gatha Mayer, MD;  Location: [facility];  Service: Endoscopy;  Laterality: N/A;   IR PARACENTESIS  05/25/2016   IR THORACENTESIS ASP PLEURAL SPACE W/IMG GUIDE  02/23/2020   LEG SKIN LESION  BIOPSY / EXCISION  12/11/14   MOHS SURGERY     ankle   TONSILLECTOMY     TOTAL HIP ARTHROPLASTY Bilateral 1993, 2006   UPPER GASTROINTESTINAL ENDOSCOPY  09/09/2017    Allergies  Allergen Reactions   Tizanidine Other (See Comments)    Hallucinate, confused     Allergies as of 02/24/2021  Reactions   Tizanidine Other (See Comments)   Hallucinate, confused         Medication List        Accurate as of February 24, 2021 11:59 PM. If you have any questions, ask your nurse or doctor.          STOP taking these medications    acetaminophen 325 MG tablet Commonly known as: TYLENOL Stopped by: Jakobe Blau X Anav Lammert, NP    insulin glargine 100 UNIT/ML injection Commonly known as: LANTUS Stopped by: Juvia Aerts X Valerya Maxton, NP   lactulose 10 GM/15ML solution Commonly known as: CHRONULAC Stopped by: Stanton Kissoon X Lindy Garczynski, NP   levothyroxine 75 MCG tablet Commonly known as: SYNTHROID Stopped by: Shulamis Wenberg X Iana Buzan, NP   metFORMIN 500 MG 24 hr tablet Commonly known as: GLUCOPHAGE-XR Stopped by: Allyne Hebert X Jamyah Folk, NP   metoprolol succinate 25 MG 24 hr tablet Commonly known as: TOPROL-XL Stopped by: Koriana Stepien X Royer Cristobal, NP   pantoprazole 40 MG tablet Commonly known as: PROTONIX Stopped by: Haralambos Yeatts X Nethra Mehlberg, NP   potassium chloride 10 MEQ CR capsule Commonly known as: MICRO-K Stopped by: Emmary Culbreath X Elih Mooney, NP   potassium chloride SA 20 MEQ tablet Commonly known as: KLOR-CON M Stopped by: Phillipa Morden X Winifred Balogh, NP   senna-docusate 8.6-50 MG tablet Commonly known as: Senokot-S Stopped by: Codee Bloodworth X Anasophia Pecor, NP   sitaGLIPtin 50 MG tablet Commonly known as: JANUVIA Stopped by: Kaidyn Javid X Itzabella Sorrels, NP   spironolactone 25 MG tablet Commonly known as: Aldactone Stopped by: Shavona Gunderman X Taquan Bralley, NP   torsemide 10 MG tablet Commonly known as: DEMADEX Stopped by: Issaih Kaus X Dontavia Brand, NP   torsemide 20 MG tablet Commonly known as: DEMADEX Stopped by: [place] Harral X Raymondo Garcialopez, NP       TAKE these medications    atropine 1 % ophthalmic solution 2 drops every 4 (four) hours.   latanoprost 0.005 % ophthalmic solution Commonly known as: XALATAN Place 1 drop into both eyes at bedtime.   LORazepam 0.5 MG tablet Commonly known as: Ativan Take 1 tablet (0.5 mg total) by mouth every 4 (four) hours as needed for anxiety.   morphine 20 MG/ML concentrated solution Commonly known as: ROXANOL Take 0.25 mLs (5 mg total) by mouth every 2 (two) hours as needed for severe pain.        Review of Systems  Unable to perform ROS: Patient unresponsive   Immunization History  Administered Date(s) Administered   Fluad Quad(high Dose 65+) 10/27/2018, 02/09/2020   Hepatitis B, adult 05/12/2014   Hepatitis B,  ped/adol 08/10/2014   Influenza Split 11/02/2009, 10/15/2010   Influenza, High Dose Seasonal PF 10/13/2014, 01/26/2017   Influenza, Quadrivalent, Recombinant, Inj, Pf 09/30/2017   Influenza, Seasonal, Injecte, Preservative Fre 10/13/2014   Influenza,inj,quad, With Preservative 11/22/2016   Influenza-Unspecified 11/25/2013, 10/06/2015, 10/22/2017, 11/10/2020   Moderna Sars-Covid-2 Vaccination 03/26/2019, 04/23/2019, 06/21/2020   PFIZER(Purple Top)SARS-COV-2 Vaccination 11/21/2019   Pfizer Covid-19 Vaccine Bivalent Booster 34yrs & up 10/11/2020   Pneumococcal Conjugate-13 05/17/2014   Pneumococcal Polysaccharide-23 10/06/2015   Tdap 10/02/2011, 11/06/2016, 11/25/2020   Zoster, Live 01/22/2005   There are no preventive care reminders to display for this patient.  Fall Risk 08/14/2020 08/15/2020 08/15/2020 09/15/2020 11/25/2020  Falls in the past year? - - - - -  Was there an injury with Fall? - - - - -  Was there an injury with Fall? - - - - -  Fall Risk Category Calculator - - - - -  Fall Risk Category - - - - -  Patient Fall Risk Level High fall risk High fall risk High fall risk High fall risk High fall risk  Patient at Risk for Falls Due to - - - - -  Fall risk Follow up - - - - -   Functional Status Survey:    Vitals:   02/24/21 1556  BP: (!) 128/94  Pulse: (!) 103  Resp: 16  Temp: 97.6 F (36.4 C)  SpO2: 90%  Weight: 189 lb 9.6 oz (86 kg)  Height: 5\' 7"  (1.702 m)   Body mass index is 29.7 kg/m. Physical Exam Constitutional:      Comments: unresponsive  HENT:     Head: Normocephalic and atraumatic.     Mouth/Throat:     Mouth: Mucous membranes are dry.  Cardiovascular:     Rate and Rhythm: Tachycardia present.  Pulmonary:     Comments: Tachypnea. Gurgling sounds from throat.  Abdominal:     Palpations: Abdomen is soft.    Labs reviewed: Recent Labs    08/13/20 0410 08/14/20 0535 08/15/20 0421 08/23/20 0000 09/09/20 0000 09/15/20 0824 11/25/20 0818  NA  133* 134* 134*   < > 138 136 134*  K 3.8 3.6 3.8   < > 3.5 4.1 3.6  CL 100 105 103   < > 103 100 99  CO2 25 25 25    < > 24* 27 24  GLUCOSE 247* 168* 186*  --   --  241* 262*  BUN 13 14 15    < > 18 15 15   CREATININE 0.82 0.83 0.97   < > 1.3* 1.14* 1.20*  CALCIUM 8.7* 8.5* 8.3*   < > 8.5* 9.2 8.6*  MG 1.8 1.9  --   --   --   --  1.7   < > = values in this interval not displayed.   Recent Labs    08/12/20 0424 08/13/20 0410 08/23/20 0000 08/30/20 0000 09/06/20 0000 09/15/20 0824  AST 143* 94*   < > 32 24 31  ALT 50* 43   < > 14 15 18   ALKPHOS 98 91   < > 90 89 92  BILITOT 1.6* 1.3*  --   --   --  1.7*  PROT 5.2* 5.3*  --   --   --  5.8*  ALBUMIN 2.6* 2.6*   < > 2.7* 2.9* 3.1*   < > = values in this interval not displayed.   Recent Labs    08/13/20 0410 08/14/20 0535 08/23/20 0000 09/15/20 0824 11/25/20 1131  WBC 3.5* 4.4 3.4 5.4 12.1*  NEUTROABS 2.3  --  2,213.00 3.8  --   HGB 9.7* 9.3* 9.2* 10.1* 7.8*  HCT 29.5* 29.4* 29* 32.3* 25.5*  MCV 92.2 93.9  --  88.7 80.4  PLT 50* 56* 73* 85* 63*   Lab Results  Component Value Date   TSH 1.387 11/25/2020   Lab Results  Component Value Date   HGBA1C 6.4 (H) 08/09/2020   Lab Results  Component Value Date   CHOL 112 11/09/2019   HDL 46 (L) 11/09/2019   LDLCALC 52 11/09/2019   TRIG 61 11/09/2019   CHOLHDL 2.4 11/09/2019    Significant Diagnostic Results in last 30 days:  No results found.  Assessment/Plan: Dementia ([place]) Advanced stage.   Glaucoma Latanoprost for comfort measures.   Respiratory failure (H[facility]) unresponsive, tachypnea, O2 desaturation, gurgling sounds from throat, will have prn Atropine ophthalmic sol 1%, 2gtt SL.   Adult failure to thrive Continue supportive care  in SNF [facility].     Family/ staff Communication: plan of care reviewed with the patient, Hospice nurse, and charge nurse.   Labs/tests ordered:  none  Time spend 25 minutes.

## 2021-02-24 NOTE — Assessment & Plan Note (Signed)
Latanoprost for comfort measures.

## 2021-02-27 ENCOUNTER — Encounter: Payer: Self-pay | Admitting: Nurse Practitioner

## 2021-02-27 DIAGNOSIS — R627 Adult failure to thrive: Secondary | ICD-10-CM | POA: Insufficient documentation

## 2021-02-27 NOTE — Assessment & Plan Note (Signed)
Continue supportive care in SNF [facility].

## 2021-03-22 DEATH — deceased

## 2022-08-17 IMAGING — US IR THORACENTESIS ASP PLEURAL SPACE W/IMG GUIDE
1 series · 1 of 1 positions shown · non-contrast
Comparison: none

INDICATION: Patient with history of alcoholic cirrhosis, HF, dyspnea, and
recurrent right pleural effusion. Request made for diagnostic and
therapeutic right thoracentesis.

[Series 1: ir (id) (id)/(id)/(id) ir · 1 of 1 slices shown]
[im 1/1]
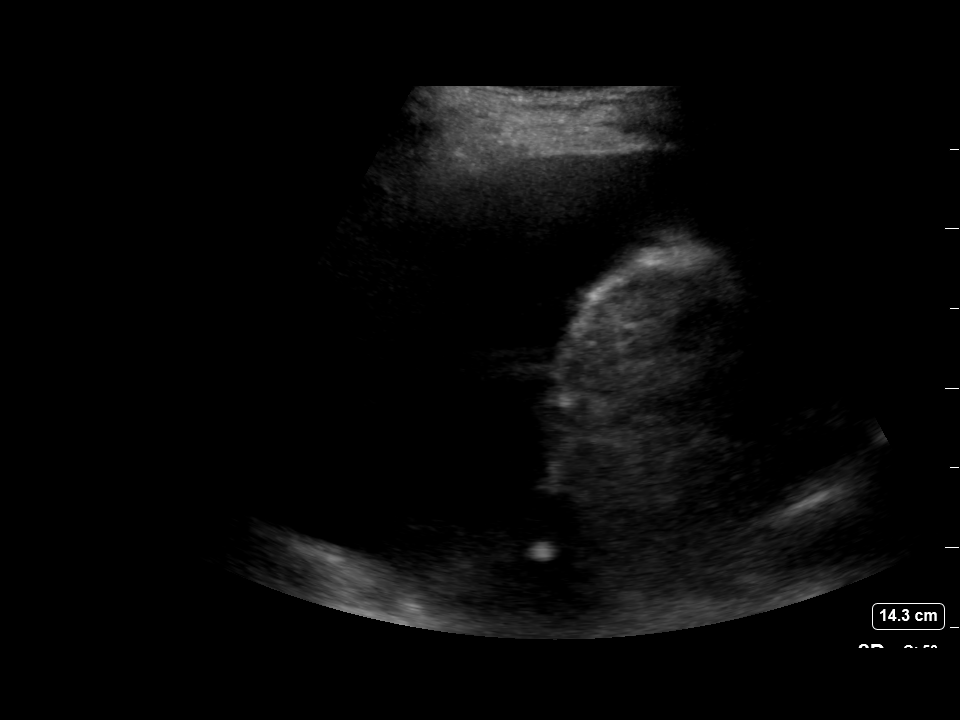

[1 of 1 positions shown; findings below may reference images not displayed]

EXAM:
ULTRASOUND GUIDED DIAGNOSTIC AND THERAPEUTIC RIGHT THORACENTESIS

MEDICATIONS:
9 mL 1% lidocaine

COMPLICATIONS:
None immediate.

PROCEDURE:
An ultrasound guided thoracentesis was thoroughly discussed with the
patient and questions answered. The benefits, risks, alternatives
and complications were also discussed. The patient understands and
wishes to proceed with the procedure. Written consent was obtained.

Ultrasound was performed to localize and mark an adequate pocket of
fluid in the right chest. The area was then prepped and draped in
the normal sterile fashion. 1% Lidocaine was used for local
anesthesia. Under ultrasound guidance a 6 Fr Safe-T-Centesis
catheter was introduced by Gashi, Shen. Thoracentesis was
performed. The catheter was removed and a dressing applied.
FINDINGS: A total of approximately 1 L of clear amber fluid was removed.
Samples were sent to the laboratory as requested by the clinical
team.
IMPRESSION: Successful ultrasound guided right thoracentesis yielding 1 L of
pleural fluid.

Five Read by: Quenan, Jhon Edilson
